# Patient Record
Sex: Female | Born: 1935 | Race: White | Hispanic: No | Smoking: Unknown if ever smoked
Health system: Southern US, Community
[De-identification: ages and names within clinical notes are randomized; demographics above are authoritative.]

## PROBLEM LIST (undated history)

## (undated) DIAGNOSIS — E048 Other specified nontoxic goiter: Secondary | ICD-10-CM

## (undated) DIAGNOSIS — N183 Chronic kidney disease, stage 3 unspecified: Secondary | ICD-10-CM

## (undated) DIAGNOSIS — I440 Atrioventricular block, first degree: Secondary | ICD-10-CM

## (undated) DIAGNOSIS — J849 Interstitial pulmonary disease, unspecified: Secondary | ICD-10-CM

## (undated) DIAGNOSIS — I2510 Atherosclerotic heart disease of native coronary artery without angina pectoris: Secondary | ICD-10-CM

## (undated) DIAGNOSIS — J189 Pneumonia, unspecified organism: Secondary | ICD-10-CM

## (undated) DIAGNOSIS — H9190 Unspecified hearing loss, unspecified ear: Secondary | ICD-10-CM

## (undated) DIAGNOSIS — Z952 Presence of prosthetic heart valve: Secondary | ICD-10-CM

## (undated) DIAGNOSIS — I447 Left bundle-branch block, unspecified: Secondary | ICD-10-CM

## (undated) DIAGNOSIS — K921 Melena: Secondary | ICD-10-CM

## (undated) DIAGNOSIS — I340 Nonrheumatic mitral (valve) insufficiency: Secondary | ICD-10-CM

## (undated) DIAGNOSIS — I639 Cerebral infarction, unspecified: Secondary | ICD-10-CM

## (undated) DIAGNOSIS — K219 Gastro-esophageal reflux disease without esophagitis: Secondary | ICD-10-CM

## (undated) DIAGNOSIS — R001 Bradycardia, unspecified: Secondary | ICD-10-CM

## (undated) DIAGNOSIS — M8580 Other specified disorders of bone density and structure, unspecified site: Secondary | ICD-10-CM

## (undated) DIAGNOSIS — I5032 Chronic diastolic (congestive) heart failure: Secondary | ICD-10-CM

## (undated) DIAGNOSIS — Z7901 Long term (current) use of anticoagulants: Secondary | ICD-10-CM

## (undated) DIAGNOSIS — Z8679 Personal history of other diseases of the circulatory system: Secondary | ICD-10-CM

## (undated) DIAGNOSIS — I350 Nonrheumatic aortic (valve) stenosis: Secondary | ICD-10-CM

## (undated) DIAGNOSIS — I495 Sick sinus syndrome: Secondary | ICD-10-CM

## (undated) DIAGNOSIS — I480 Paroxysmal atrial fibrillation: Secondary | ICD-10-CM

## (undated) DIAGNOSIS — K5730 Diverticulosis of large intestine without perforation or abscess without bleeding: Secondary | ICD-10-CM

## (undated) DIAGNOSIS — I10 Essential (primary) hypertension: Secondary | ICD-10-CM

## (undated) HISTORY — DX: Personal history of other diseases of the circulatory system: Z86.79

## (undated) HISTORY — DX: Other specified disorders of bone density and structure, unspecified site: M85.80

## (undated) HISTORY — DX: Atherosclerotic heart disease of native coronary artery without angina pectoris: I25.10

## (undated) HISTORY — DX: Essential (primary) hypertension: I10

## (undated) HISTORY — DX: Bradycardia, unspecified: R00.1

## (undated) HISTORY — DX: Left bundle-branch block, unspecified: I44.7

## (undated) HISTORY — PX: OTHER SURGICAL HISTORY: SHX169

## (undated) HISTORY — PX: CARPAL TUNNEL RELEASE: SHX101

## (undated) HISTORY — DX: Melena: K92.1

## (undated) HISTORY — DX: Cerebral infarction, unspecified: I63.9

## (undated) HISTORY — DX: Pneumonia, unspecified organism: J18.9

## (undated) HISTORY — DX: Sick sinus syndrome: I49.5

## (undated) HISTORY — DX: Long term (current) use of anticoagulants: Z79.01

## (undated) HISTORY — DX: Gastro-esophageal reflux disease without esophagitis: K21.9

## (undated) HISTORY — DX: Chronic diastolic (congestive) heart failure: I50.32

## (undated) HISTORY — DX: Atrioventricular block, first degree: I44.0

## (undated) HISTORY — DX: Nonrheumatic mitral (valve) insufficiency: I34.0

## (undated) HISTORY — DX: Chronic kidney disease, stage 3 (moderate): N18.3

## (undated) HISTORY — DX: Other specified nontoxic goiter: E04.8

## (undated) HISTORY — DX: Interstitial pulmonary disease, unspecified: J84.9

## (undated) HISTORY — DX: Diverticulosis of large intestine without perforation or abscess without bleeding: K57.30

## (undated) HISTORY — PX: CATARACT EXTRACTION: SUR2

## (undated) HISTORY — DX: Paroxysmal atrial fibrillation: I48.0

## (undated) HISTORY — DX: Chronic kidney disease, stage 3 unspecified: N18.30

## (undated) HISTORY — DX: Nonrheumatic aortic (valve) stenosis: I35.0

---

## 1997-12-27 ENCOUNTER — Ambulatory Visit (HOSPITAL_COMMUNITY): Admission: RE | Admit: 1997-12-27 | Discharge: 1997-12-27 | Payer: Self-pay | Admitting: Emergency Medicine

## 1998-07-11 ENCOUNTER — Other Ambulatory Visit: Admission: RE | Admit: 1998-07-11 | Discharge: 1998-07-11 | Payer: Self-pay | Admitting: Emergency Medicine

## 1999-08-12 ENCOUNTER — Other Ambulatory Visit: Admission: RE | Admit: 1999-08-12 | Discharge: 1999-08-12 | Payer: Self-pay | Admitting: Emergency Medicine

## 2000-02-04 ENCOUNTER — Ambulatory Visit (HOSPITAL_COMMUNITY): Admission: RE | Admit: 2000-02-04 | Discharge: 2000-02-04 | Payer: Self-pay | Admitting: Emergency Medicine

## 2000-02-04 ENCOUNTER — Encounter: Payer: Self-pay | Admitting: Emergency Medicine

## 2000-09-23 ENCOUNTER — Ambulatory Visit (HOSPITAL_COMMUNITY): Admission: RE | Admit: 2000-09-23 | Discharge: 2000-09-23 | Payer: Self-pay | Admitting: Specialist

## 2000-11-04 ENCOUNTER — Ambulatory Visit (HOSPITAL_COMMUNITY): Admission: RE | Admit: 2000-11-04 | Discharge: 2000-11-04 | Payer: Self-pay | Admitting: Specialist

## 2001-02-03 ENCOUNTER — Other Ambulatory Visit: Admission: RE | Admit: 2001-02-03 | Discharge: 2001-02-03 | Payer: Self-pay | Admitting: Emergency Medicine

## 2001-02-11 ENCOUNTER — Encounter: Payer: Self-pay | Admitting: Emergency Medicine

## 2001-02-11 ENCOUNTER — Ambulatory Visit (HOSPITAL_COMMUNITY): Admission: RE | Admit: 2001-02-11 | Discharge: 2001-02-11 | Payer: Self-pay | Admitting: Emergency Medicine

## 2001-02-15 ENCOUNTER — Encounter: Admission: RE | Admit: 2001-02-15 | Discharge: 2001-02-15 | Payer: Self-pay | Admitting: Emergency Medicine

## 2001-02-15 ENCOUNTER — Encounter: Payer: Self-pay | Admitting: Emergency Medicine

## 2002-02-18 ENCOUNTER — Ambulatory Visit (HOSPITAL_COMMUNITY): Admission: RE | Admit: 2002-02-18 | Discharge: 2002-02-18 | Payer: Self-pay | Admitting: Emergency Medicine

## 2002-02-18 ENCOUNTER — Encounter: Payer: Self-pay | Admitting: Emergency Medicine

## 2003-02-28 ENCOUNTER — Ambulatory Visit (HOSPITAL_COMMUNITY): Admission: RE | Admit: 2003-02-28 | Discharge: 2003-02-28 | Payer: Self-pay | Admitting: Emergency Medicine

## 2003-02-28 ENCOUNTER — Encounter: Payer: Self-pay | Admitting: Emergency Medicine

## 2004-02-29 ENCOUNTER — Ambulatory Visit (HOSPITAL_COMMUNITY): Admission: RE | Admit: 2004-02-29 | Discharge: 2004-02-29 | Payer: Self-pay | Admitting: Emergency Medicine

## 2005-03-04 ENCOUNTER — Ambulatory Visit (HOSPITAL_COMMUNITY): Admission: RE | Admit: 2005-03-04 | Discharge: 2005-03-04 | Payer: Self-pay | Admitting: Emergency Medicine

## 2005-06-16 ENCOUNTER — Encounter: Admission: RE | Admit: 2005-06-16 | Discharge: 2005-06-16 | Payer: Self-pay | Admitting: Family Medicine

## 2005-06-16 IMAGING — CT CT PELVIS W/O CM
2 of 4 series · 13 of 32 positions shown, 18 images · IV contrast (agent unspecified)
Comparison: None available.

CLINICAL DATA: Abdominal and pelvic pain, right flank pain.
 ABDOMEN CT WITHOUT CONTRAST:
TECHNIQUE: Multidetector CT imaging of the abdomen was performed following the standard protocol without IV contrast.
TECHNIQUE: Multidetector CT imaging of the pelvis was performed following the standard protocol without IV contrast.
 Descending and sigmoid colonic diverticulosis identified without evidence of diverticulitis.  No free fluid or enlarged lymph nodes are present.  The visualized bowel and bladder are otherwise unremarkable.  No free fluid or enlarged lymph nodes.  Degenerative changes in the lumbar spine are identified.

[Series 2: renal stone · axial · 0.85mm/px · z∈[-338,-98]mm · 5 of 73 slices shown, 10 images]
[im 13/73  soft-tissue]
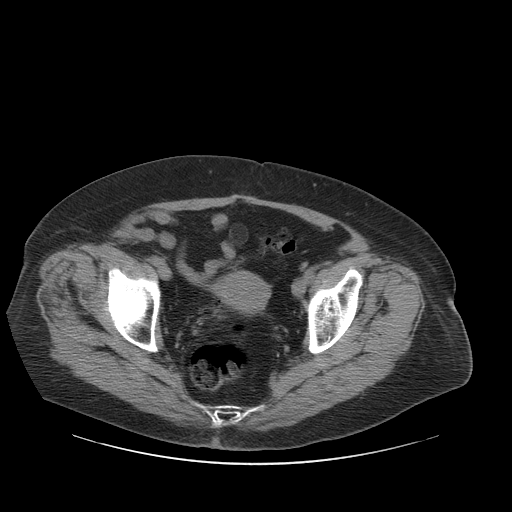
[im 13/73  bone]
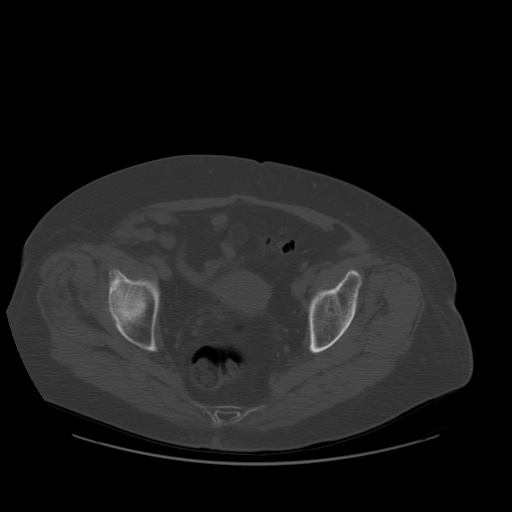
[im 25/73  soft-tissue]
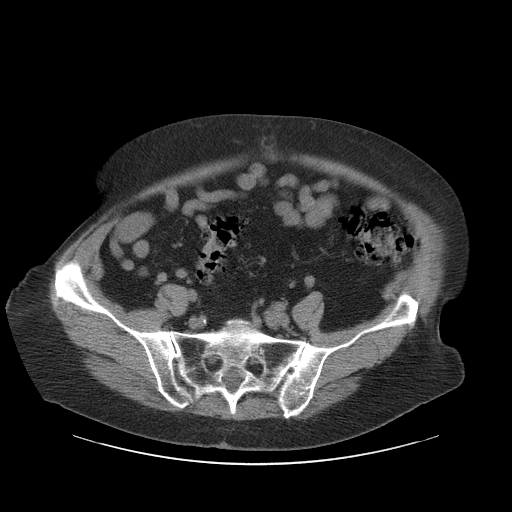
[im 25/73  lung]
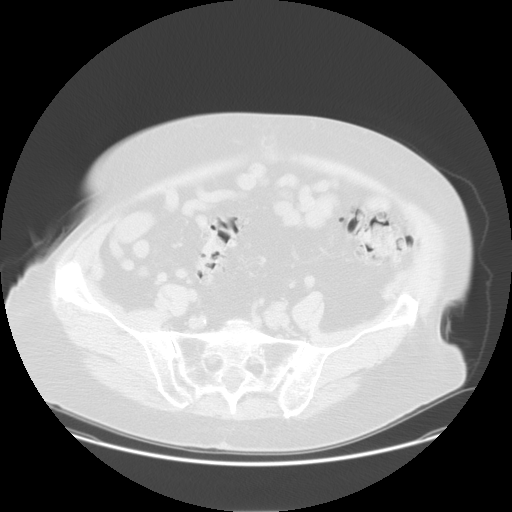
[im 37/73  soft-tissue]
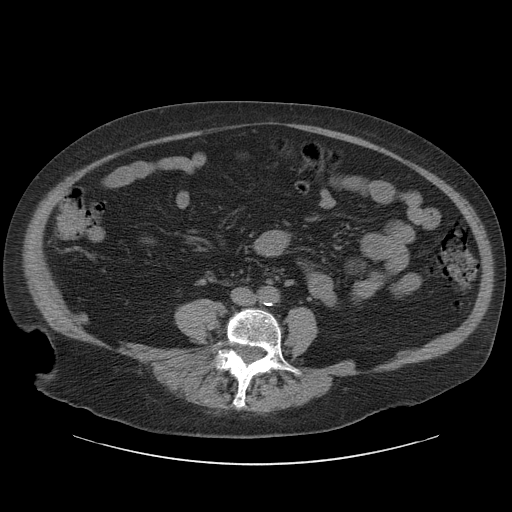
[im 37/73  lung]
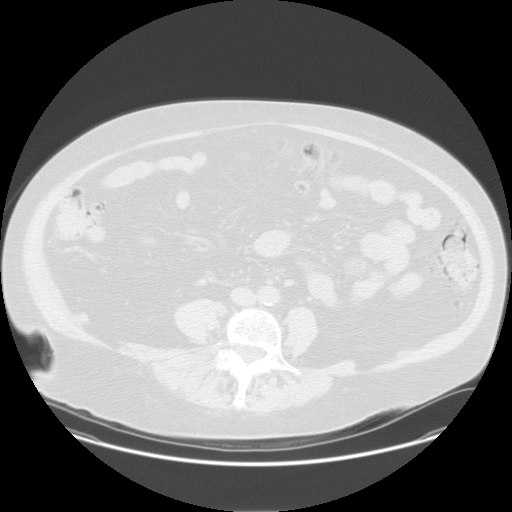
[im 49/73  soft-tissue]
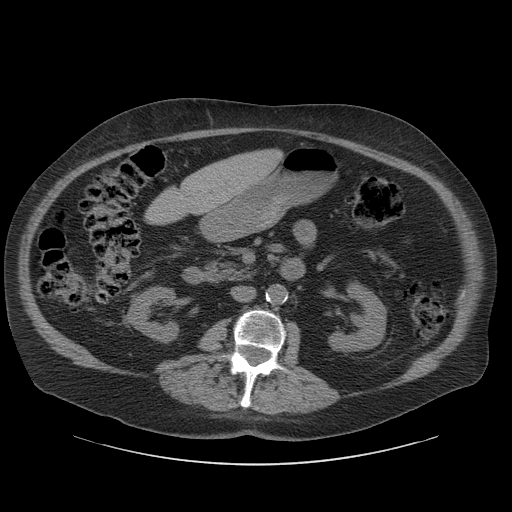
[im 49/73  lung]
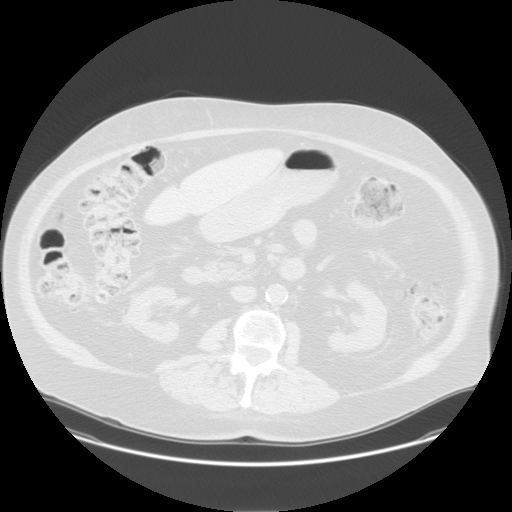
[im 61/73  soft-tissue]
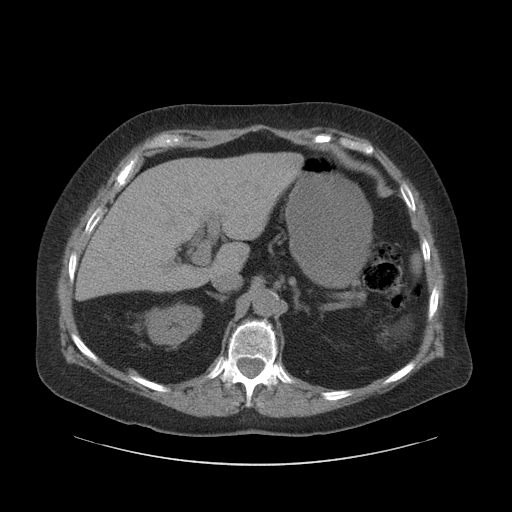
[im 61/73  lung]
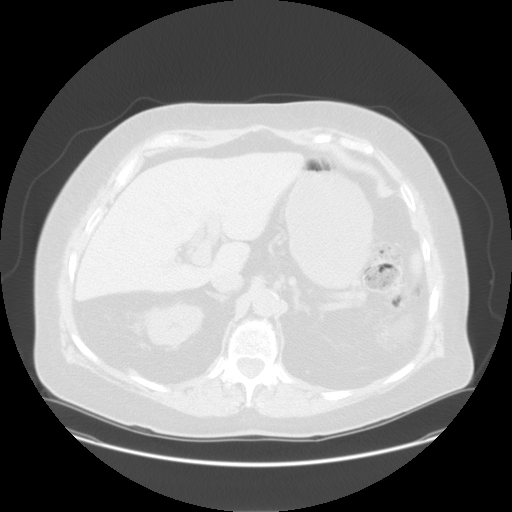

[Series 104: reformatted · sagittal · 0.85mm/px · 8 of 154 slices shown]
[im 12/154  soft-tissue]
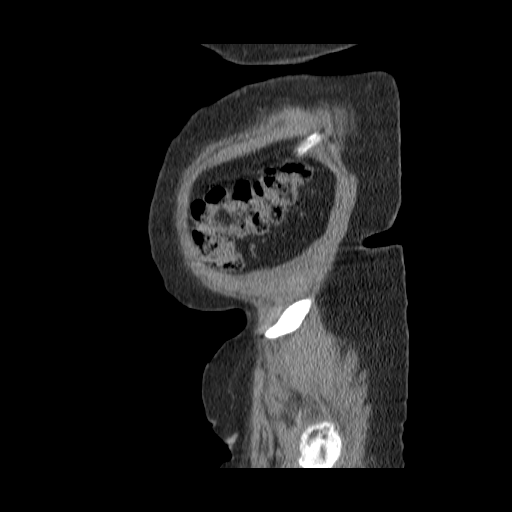
[im 36/154  soft-tissue]
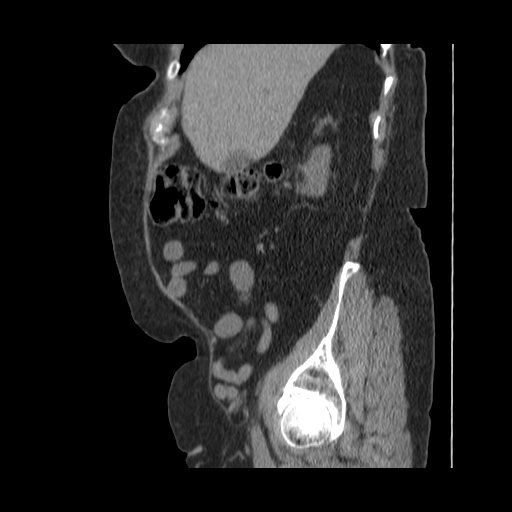
[im 48/154  soft-tissue]
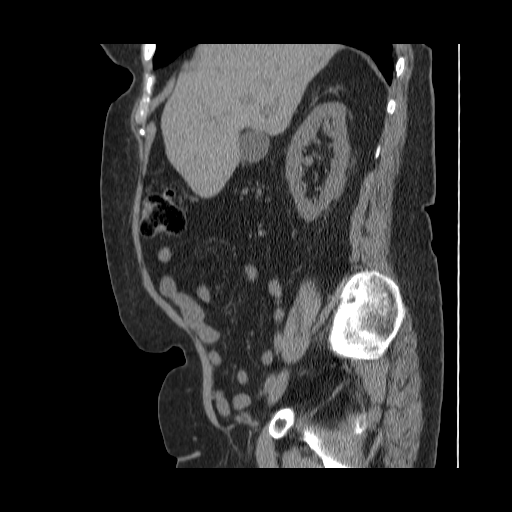
[im 71/154  soft-tissue]
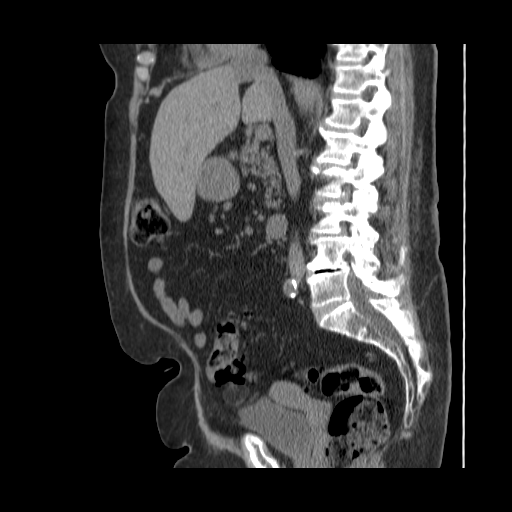
[im 83/154  soft-tissue]
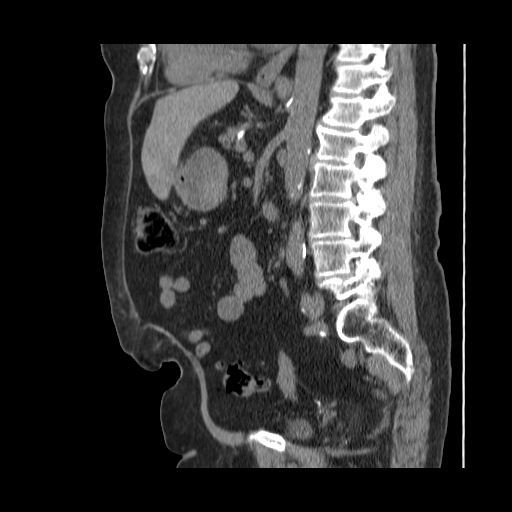
[im 106/154  soft-tissue]
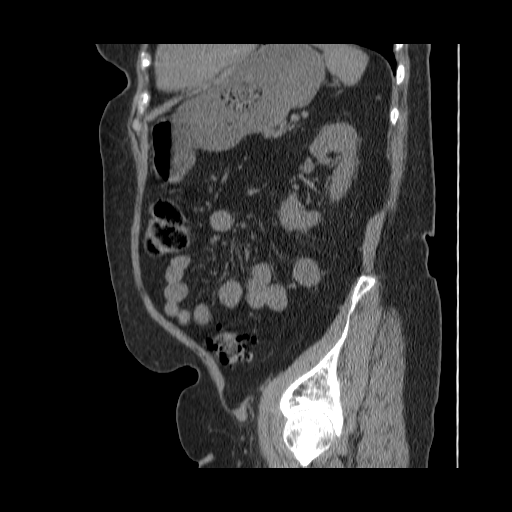
[im 118/154  soft-tissue]
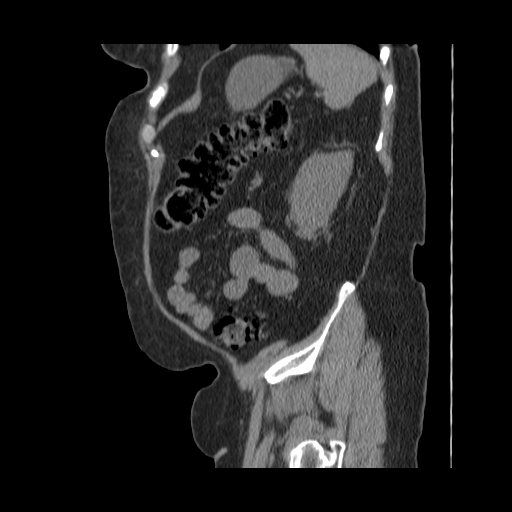
[im 142/154  soft-tissue]
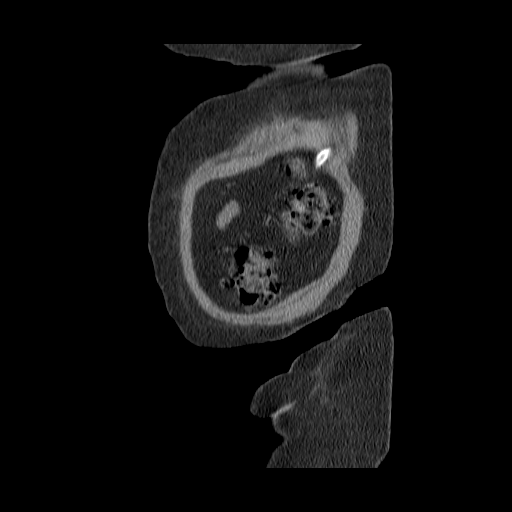

[13 of 32 positions shown; findings below may reference images not displayed]

Visualized liver and spleen are unremarkable.  The adrenal glands, pancreas, gallbladder are within normal limits.  Mild bilateral perinephric stranding may be compatible with inflammation of uncertain chronicity.  There is no evidence of hydronephrosis or perinephric calculi.  Left sided colonic diverticulosis is present without evidence of diverticulitis.  No evidence of free fluid, enlarged lymph nodes, abdominal aortic aneurysm, or biliary dilatation.  Please note that parenchymal abnormalities may be missed as intravenous contrast was not administered.
IMPRESSION: 1.  Mild perinephric inflammation of uncertain chronicity but may be remote.  No evidence of hydronephrosis or urinary calculi. 
 2.  Colonic diverticulosis without evidence of diverticulitis. 
 PELVIS CT WITHOUT CONTRAST:
IMPRESSION: 1.  No acute abnormality.  
 2.  Colonic diverticulosis without evidence of diverticulitis.

## 2006-03-06 ENCOUNTER — Ambulatory Visit (HOSPITAL_COMMUNITY): Admission: RE | Admit: 2006-03-06 | Discharge: 2006-03-06 | Payer: Self-pay | Admitting: Emergency Medicine

## 2007-03-09 ENCOUNTER — Ambulatory Visit (HOSPITAL_COMMUNITY): Admission: RE | Admit: 2007-03-09 | Discharge: 2007-03-09 | Payer: Self-pay | Admitting: Emergency Medicine

## 2007-08-18 HISTORY — PX: MRI: SHX5353

## 2007-10-19 ENCOUNTER — Encounter: Payer: Self-pay | Admitting: Emergency Medicine

## 2007-10-19 ENCOUNTER — Ambulatory Visit (HOSPITAL_COMMUNITY): Admission: RE | Admit: 2007-10-19 | Discharge: 2007-10-19 | Payer: Self-pay | Admitting: Emergency Medicine

## 2007-10-19 ENCOUNTER — Ambulatory Visit: Payer: Self-pay | Admitting: Surgery

## 2008-03-14 ENCOUNTER — Ambulatory Visit (HOSPITAL_COMMUNITY): Admission: RE | Admit: 2008-03-14 | Discharge: 2008-03-14 | Payer: Self-pay | Admitting: Emergency Medicine

## 2008-11-30 ENCOUNTER — Encounter: Admission: RE | Admit: 2008-11-30 | Discharge: 2008-11-30 | Payer: Self-pay | Admitting: Emergency Medicine

## 2008-12-12 ENCOUNTER — Inpatient Hospital Stay (HOSPITAL_COMMUNITY): Admission: AD | Admit: 2008-12-12 | Discharge: 2008-12-15 | Payer: Self-pay | Admitting: Cardiology

## 2008-12-12 IMAGING — CR DG CHEST 1V PORT
1 series · 1 of 1 positions shown · non-contrast
Comparison: None

CLINICAL DATA: Atrial fibrillation

PORTABLE CHEST - 1 VIEW

[AP]
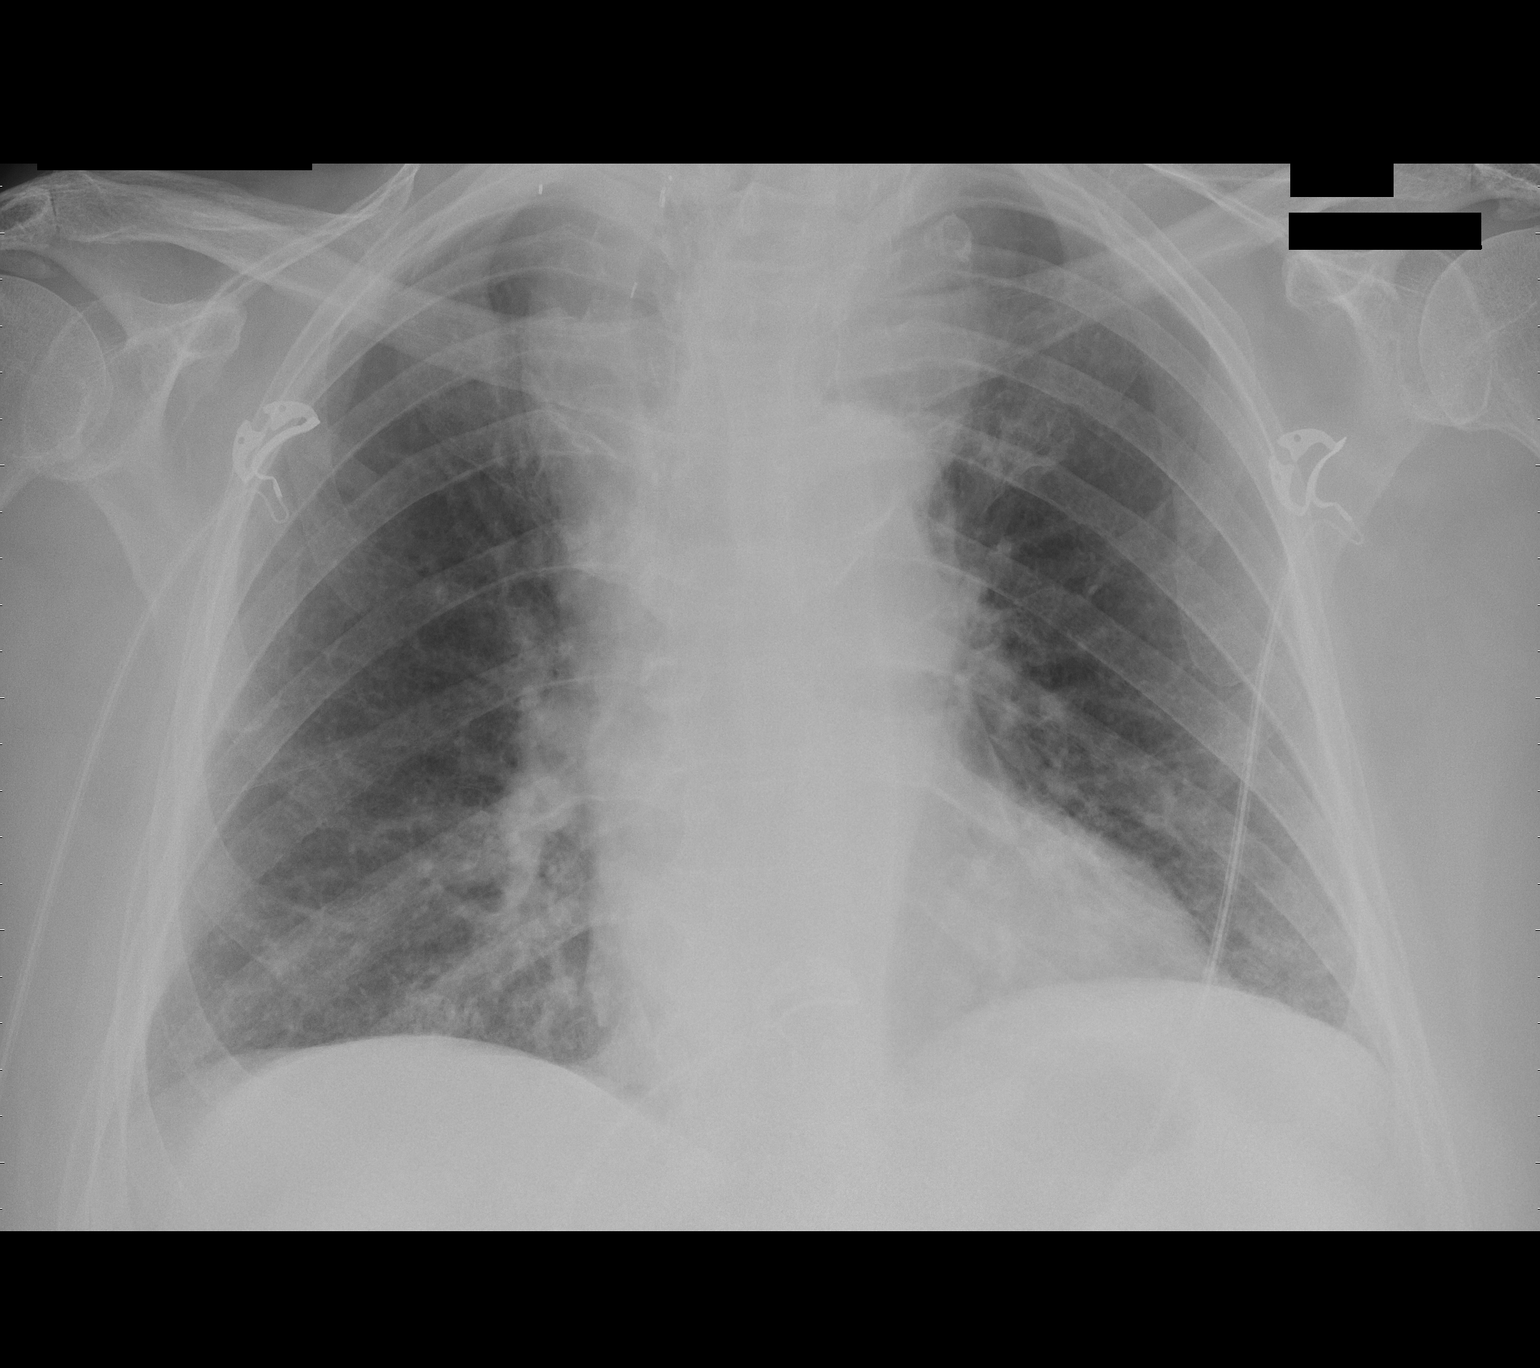

[1 of 1 positions shown; findings below may reference images not displayed]

FINDINGS: Heart size is normal.

There is no pleural effusion or pulmonary edema.

The lung volumes are low.

There is chronic interstitial changes.

No airspace consolidation identified.
IMPRESSION: 1.  No acute findings.
2.  Low lung volumes and chronic lung disease.

## 2008-12-22 ENCOUNTER — Emergency Department (HOSPITAL_COMMUNITY): Admission: EM | Admit: 2008-12-22 | Discharge: 2008-12-22 | Payer: Self-pay | Admitting: Emergency Medicine

## 2009-03-21 ENCOUNTER — Ambulatory Visit (HOSPITAL_COMMUNITY): Admission: RE | Admit: 2009-03-21 | Discharge: 2009-03-21 | Payer: Self-pay | Admitting: Emergency Medicine

## 2009-04-25 ENCOUNTER — Ambulatory Visit (HOSPITAL_COMMUNITY): Admission: RE | Admit: 2009-04-25 | Discharge: 2009-04-25 | Payer: Self-pay | Admitting: Cardiology

## 2009-04-25 IMAGING — CT CT NECK W/ CM
4 of 7 series · 13 of 33 positions shown, 14 images · IV contrast (APPLIED)
Comparison: Chest radiograph dated [DATE]

CT NECK

CLINICAL DATA: Evaluate for mass.

CT NECK AND CHEST WITH CONTRAST
TECHNIQUE: Multidetector CT imaging of the neck and chest was
performed using the standard protocol after bolus administration of
intravenous contrast.
Contrast: 100 ml of omni 300

[Series 3: chest 5.0 st · axial · 0.66mm/px · z∈[-388,-288]mm · 2 of 60 slices shown, 3 images]
[im 20/60  soft-tissue]
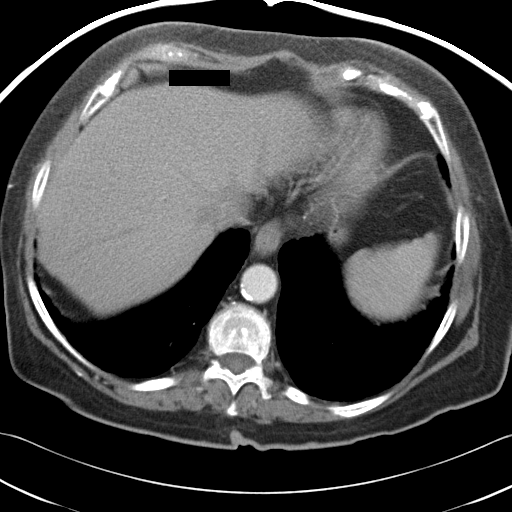
[im 20/60  bone]
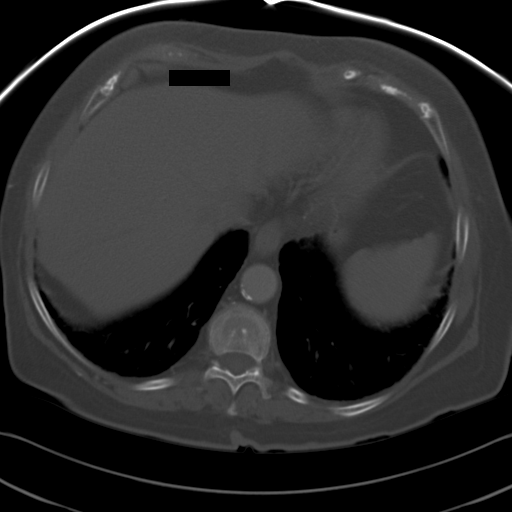
[im 40/60  bone]
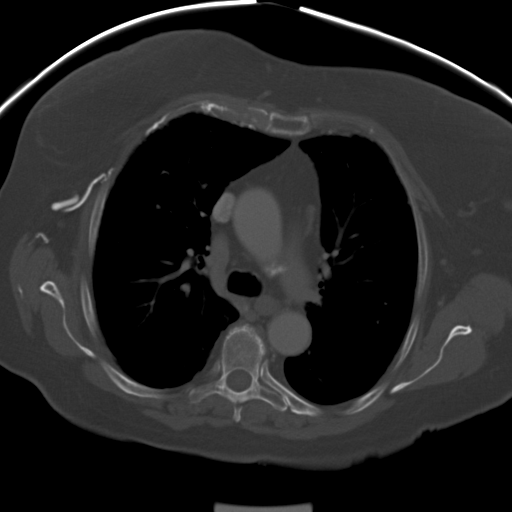

[Series 6: st neck 2.0 b31s · axial · 0.32mm/px · z∈[-238,-96]mm · 5 of 107 slices shown]
[im 18/107  bone]
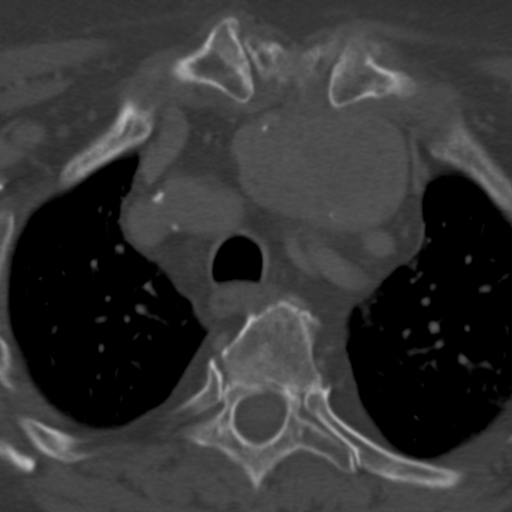
[im 36/107  bone]
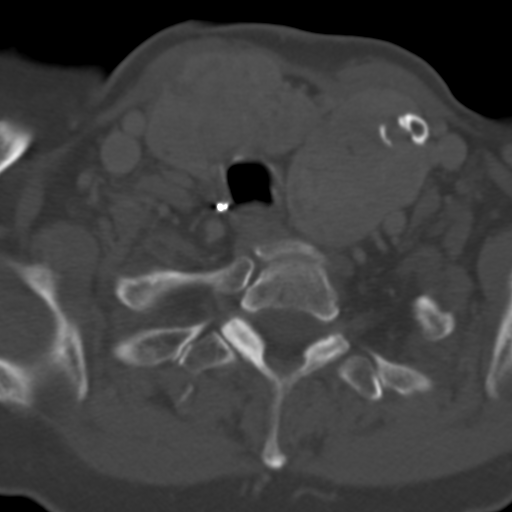
[im 54/107  bone]
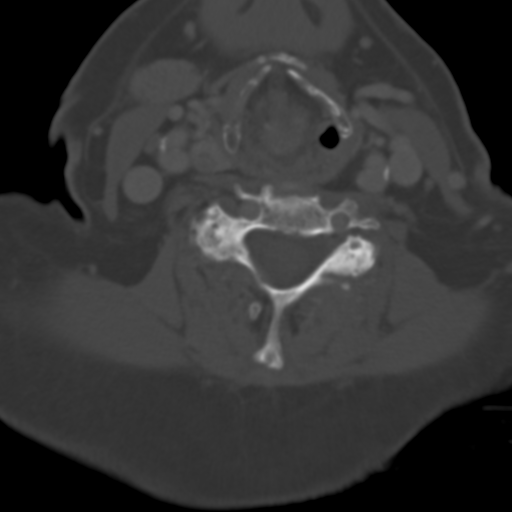
[im 71/107  bone]
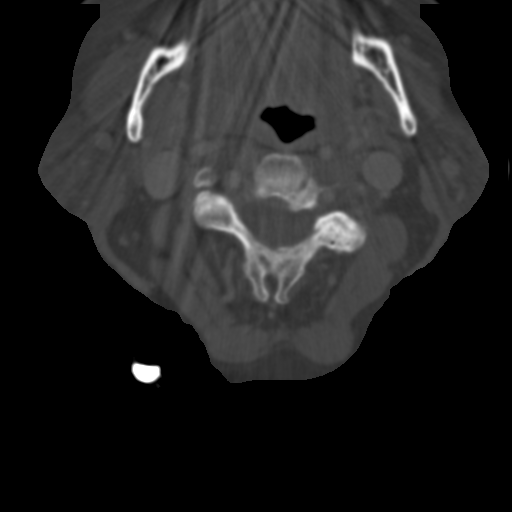
[im 89/107  bone]
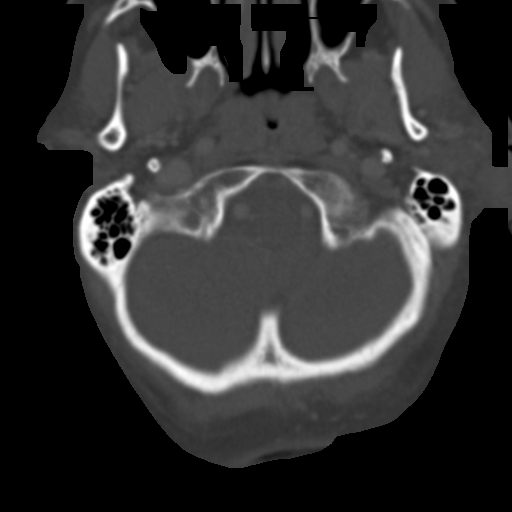

[Series 602: <mpr thick cor · coronal · 0.42mm/px · 1 of 67 slices shown]
[im 34/67  bone]
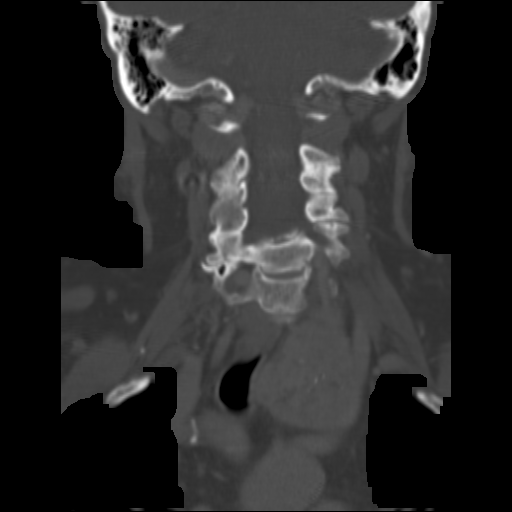

[Series 603: <mpr thick sag · sagittal · 0.42mm/px · 5 of 77 slices shown]
[im 13/77  bone]
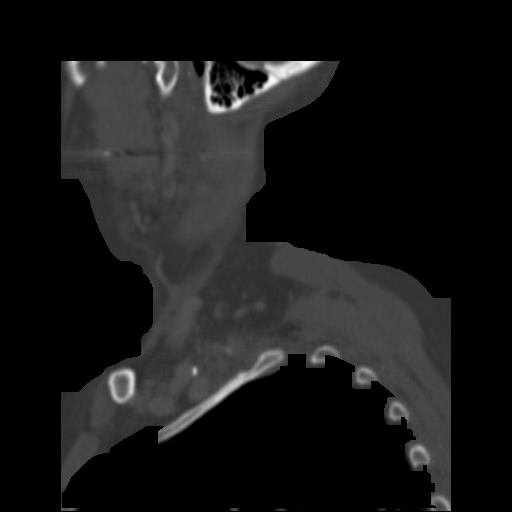
[im 26/77  bone]
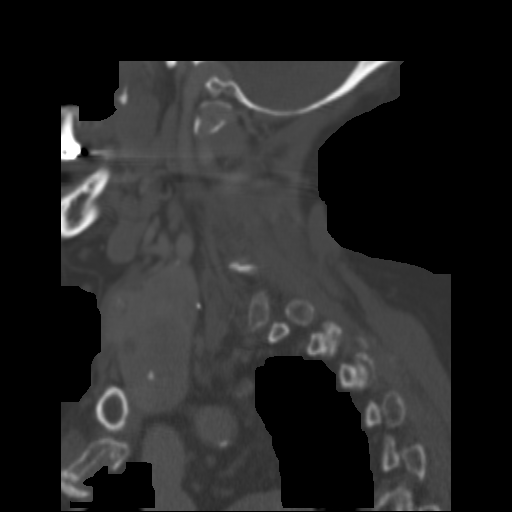
[im 39/77  bone]
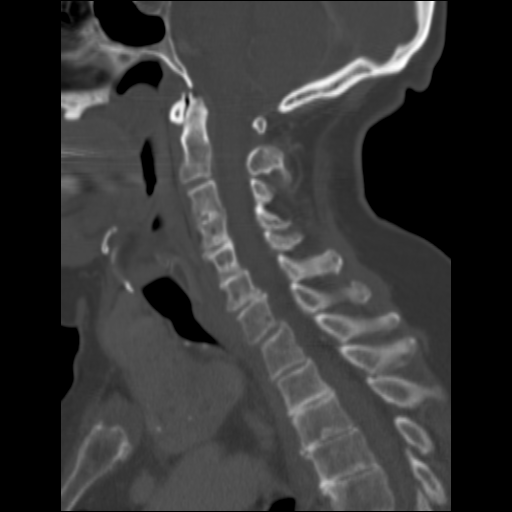
[im 51/77  bone]
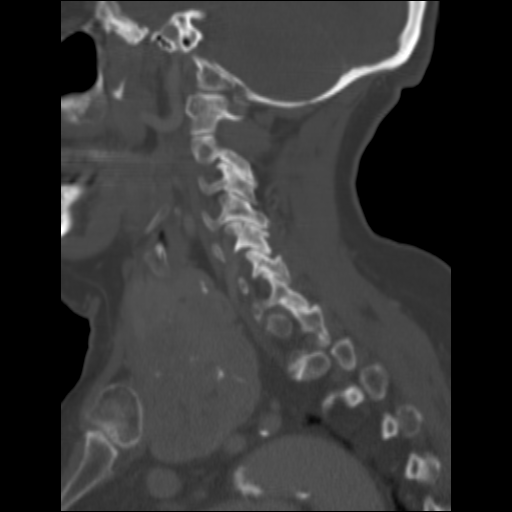
[im 64/77  bone]
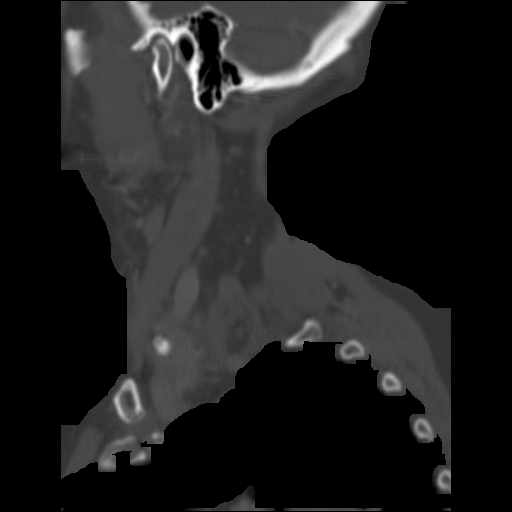

[13 of 33 positions shown; findings below may reference images not displayed]

FINDINGS: The visualized intracranial contents appear normal.

The parotid glands and submandibular glands appear normal.

The mastoid air cells and the visualized portions of the paranasal
sinuses are unremarkable.

No enlarged lymph nodes identified.

The vascular structures of the neck appear normal.

The patient has a very large multinodular goiter accounting for the
mass seen on chest radiograph.  This goiter contains multiple areas
of calcification and low density nodules.  There is mass effect
upon the trachea.
IMPRESSION: 1.  Very large multinodular goiter.

CT CHEST
FINDINGS: There is significant retrosternal extension of the
goiter.

There are no enlarged supraclavicular or axillary lymph nodes.

No enlarged mediastinal or hilar lymph nodes.

The thoracic aorta and pulmonary artery are both normal in caliber
without aneurysm.

There is no pericardial or pleural effusion.

There are basilar predominant reticular interstitial markings
identified.

No suspicious pulmonary nodules or masses noted.

The bones appear osteopenic and there is spondylosis within the
thoracic spine.
IMPRESSION: 1.  Negative for mass or aneurysm.
2.  Large multinodular goiter exhibits retrosternal extension into
the superior mediastinum.
3.  Reticular interstitial markings suggest interstitial lung
disease such as pulmonary fibrosis.

## 2009-05-07 ENCOUNTER — Encounter: Admission: RE | Admit: 2009-05-07 | Discharge: 2009-05-07 | Payer: Self-pay | Admitting: Internal Medicine

## 2009-05-07 IMAGING — US US SOFT TISSUE HEAD/NECK
1 series · 14 of 25 positions shown · non-contrast
Comparison: CT neck and chest [DATE]

CLINICAL DATA: Multinodular goiter follow-up.

THYROID ULTRASOUND
TECHNIQUE: Ultrasound examination of the thyroid gland and
adjacent soft tissues was performed.

[Series 1: us soft tissue head/neck · 0.16mm/px · 14 of 49 slices shown]
[im 1/49]
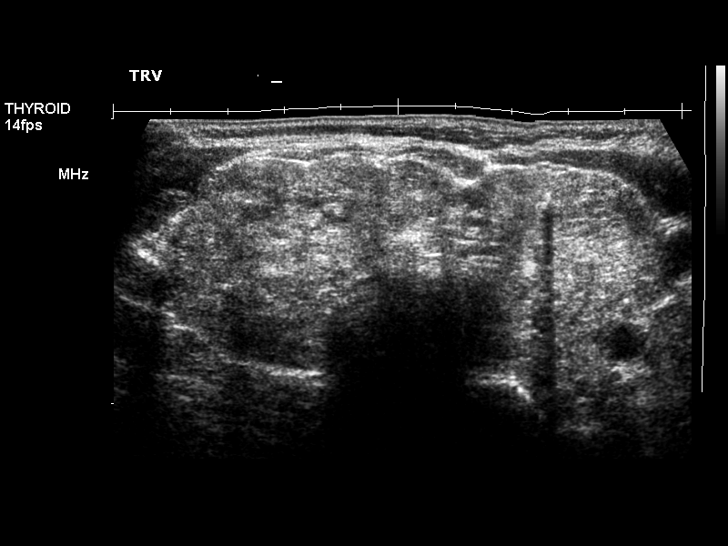
[im 5/49]
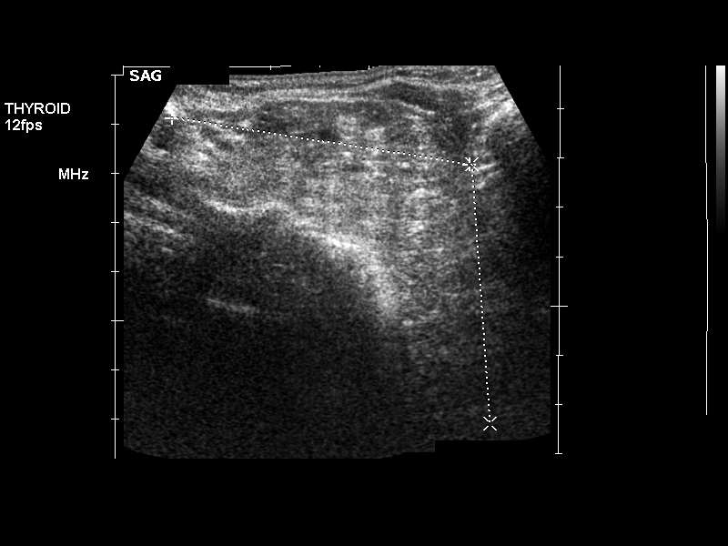
[im 9/49]
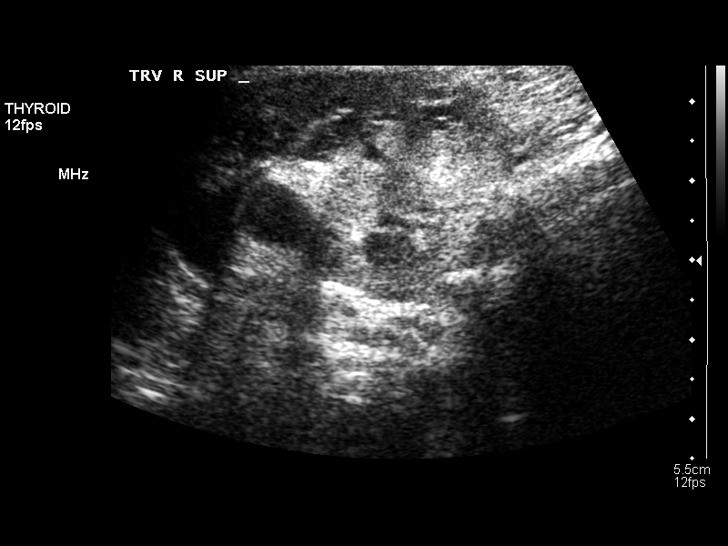
[im 13/49]
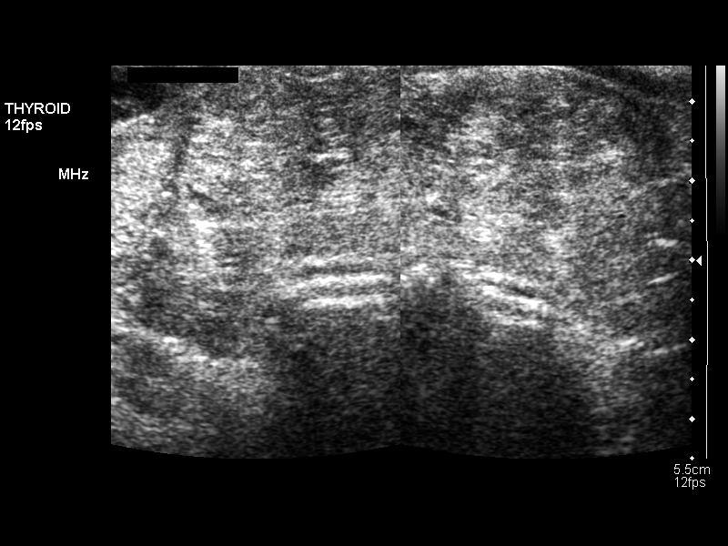
[im 17/49]
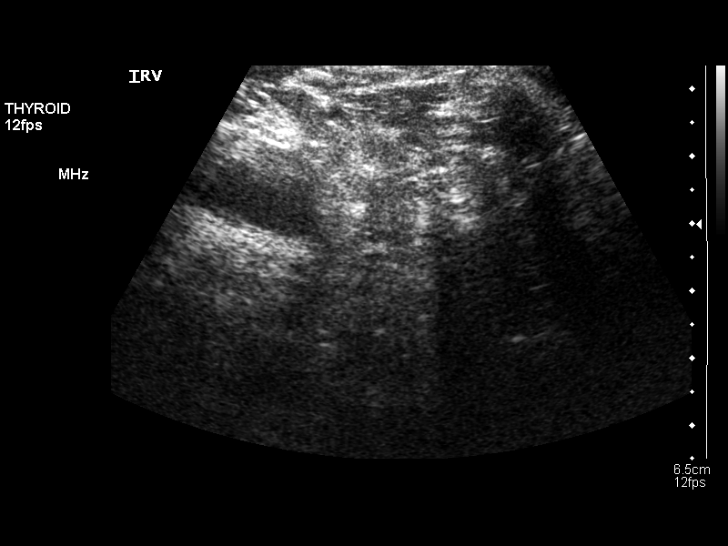
[im 19/49]
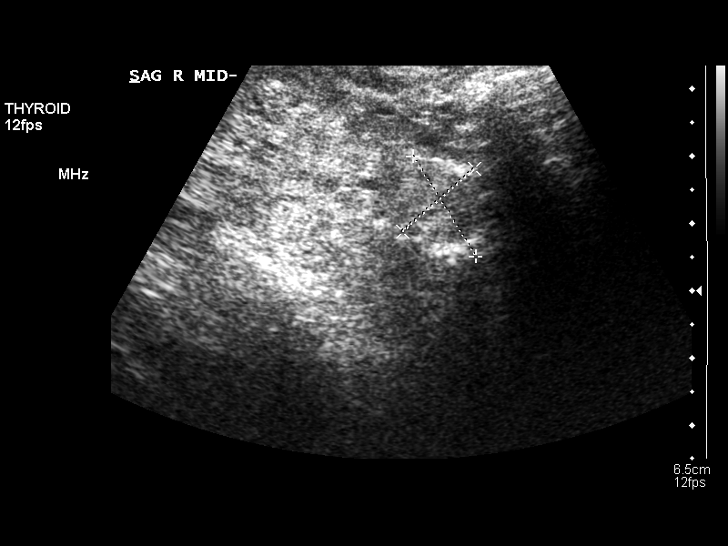
[im 23/49]
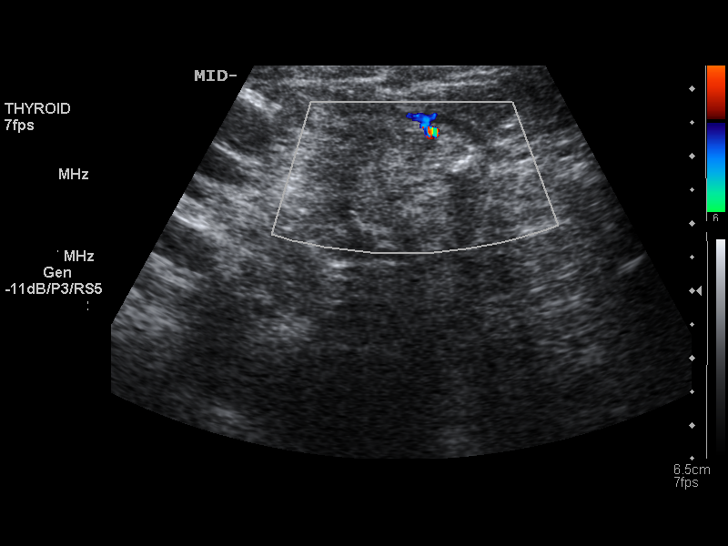
[im 27/49]
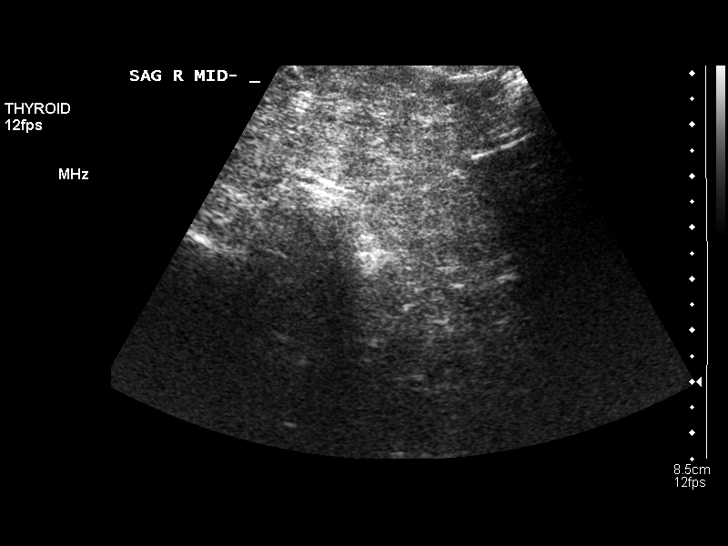
[im 31/49]
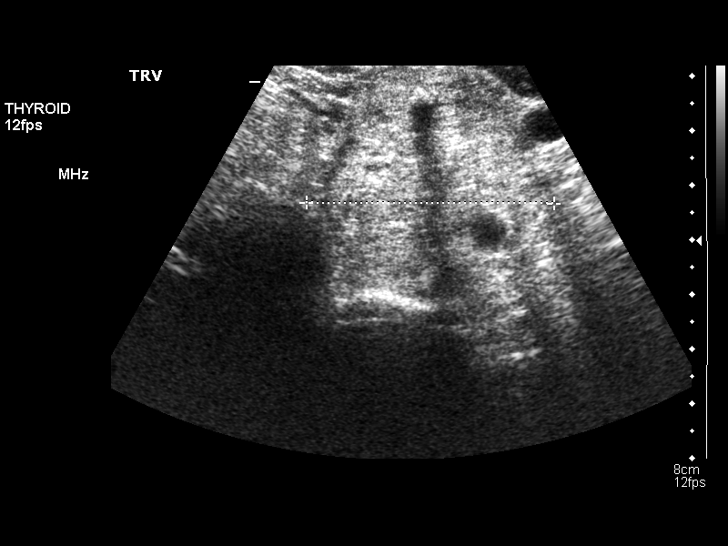
[im 33/49]
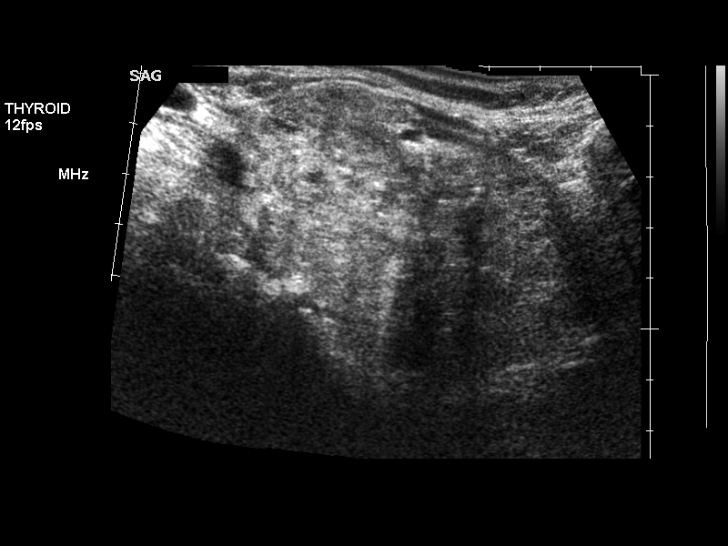
[im 37/49]
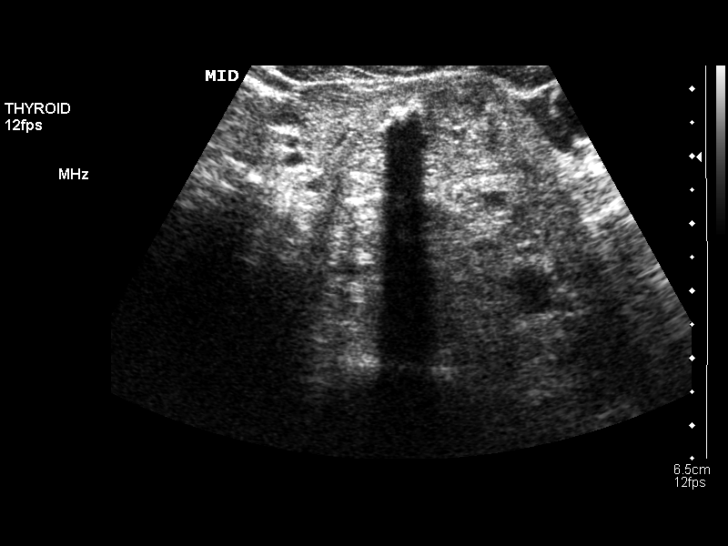
[im 41/49]
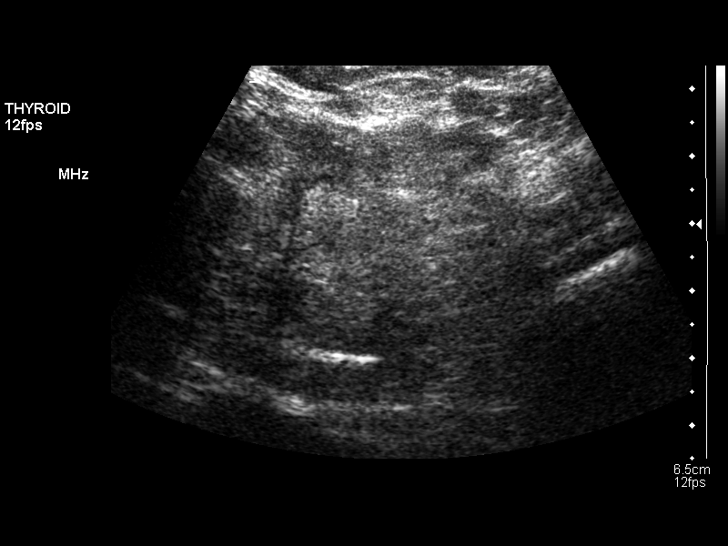
[im 45/49]
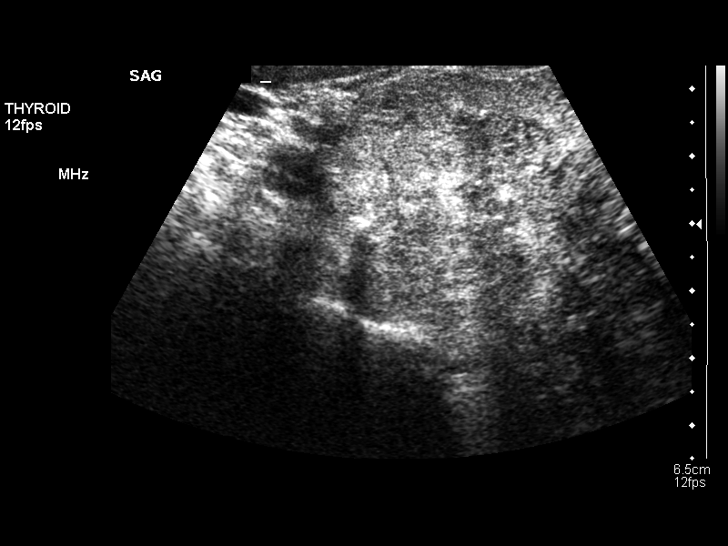
[im 49/49]
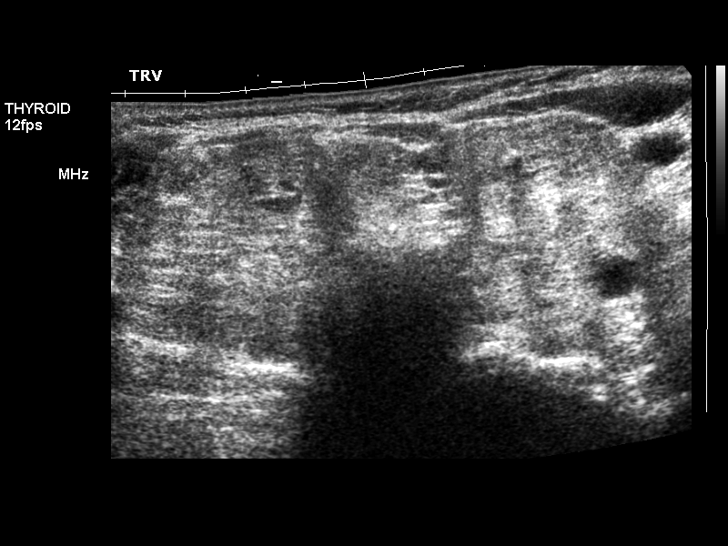

[14 of 25 positions shown; findings below may reference images not displayed]

FINDINGS: The thyroid is markedly enlarged and heterogeneous, with
areas of calcification.  Right lobe of the thyroid measures
approximately 11.4 x 3.3 x 3.7 cm and left lobe, 9.0 x 5.1 x
cm.  Isthmus measures 2.5 cm.

There may be two solid nodules in the medial aspect of the mid pole
of the right thyroid, measuring 2.5 x 1.9 x 3.4 cm and 1.8 x 1.4 x
1.8 cm, respectively.
IMPRESSION: Markedly enlarged and heterogeneous thyroid, with two possible
solid nodules in the right mid pole.

## 2009-07-03 ENCOUNTER — Encounter: Admission: RE | Admit: 2009-07-03 | Discharge: 2009-07-03 | Payer: Self-pay | Admitting: Internal Medicine

## 2009-07-03 ENCOUNTER — Other Ambulatory Visit: Admission: RE | Admit: 2009-07-03 | Discharge: 2009-07-03 | Payer: Self-pay | Admitting: Diagnostic Radiology

## 2009-07-03 IMAGING — US US BIOPSY
1 series · 6 of 6 positions shown · non-contrast
Comparison: Ultrasound performed [DATE]

CLINICAL DATA: Finding of thyroid abnormality on CT of the neck and
chest.  Ultrasound of performed revealing one to two solid nodules
in the mid pole  of the right thyroid.  Request has been made for
fine needle aspirate of same.

ULTRASOUND-GUIDED NEEDLE ASPIRATE BIOPSY, RIGHT LOBE OF THYROID
The above procedure was discussed with the patient and written
informed consent was obtained.

[Series 1: us biopsy · 6 acquisitions, 6 frames shown]
[im 1/6]
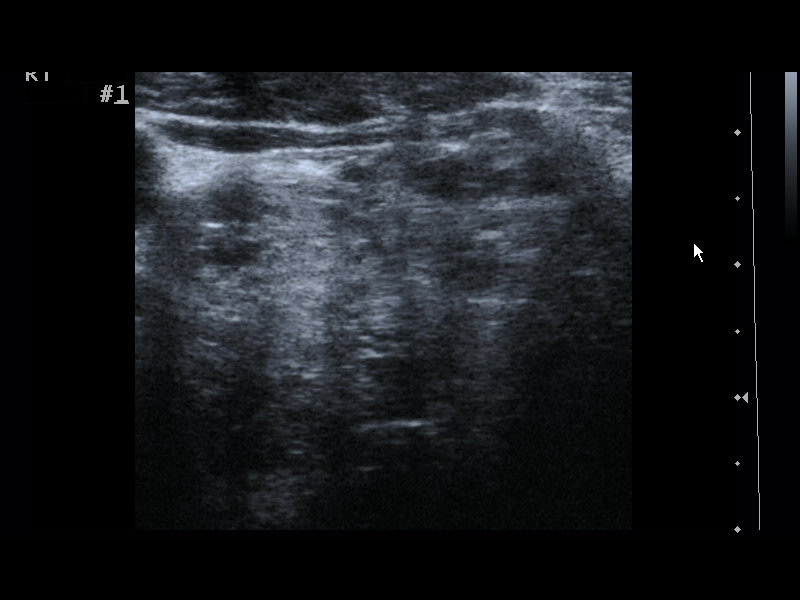
[im 2/6]
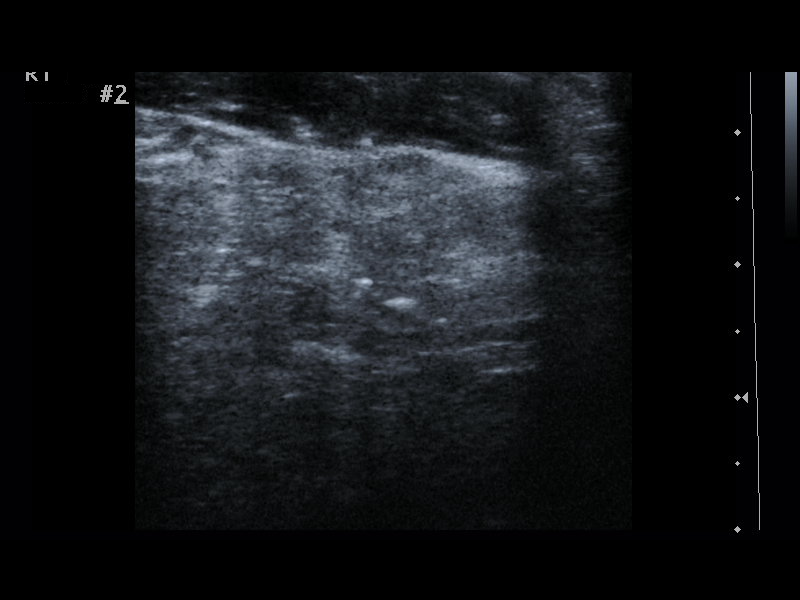
[im 3/6]
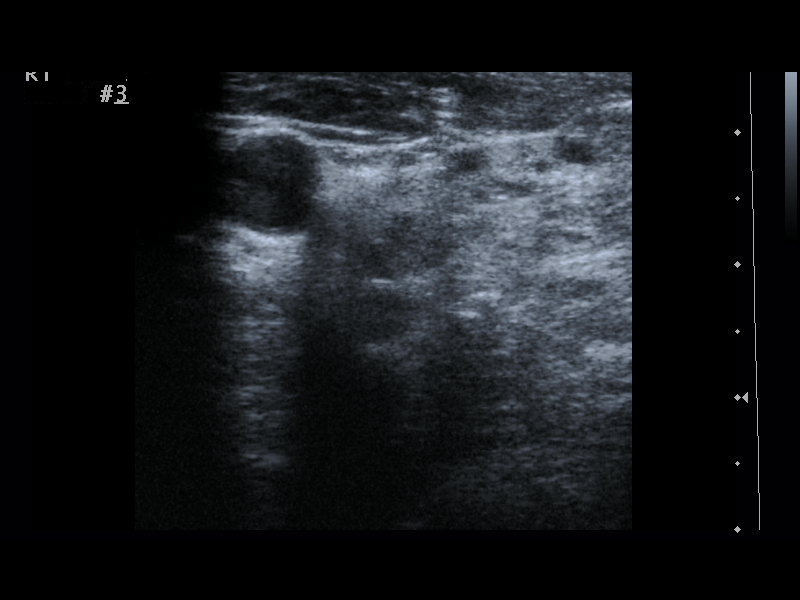
[im 4/6]
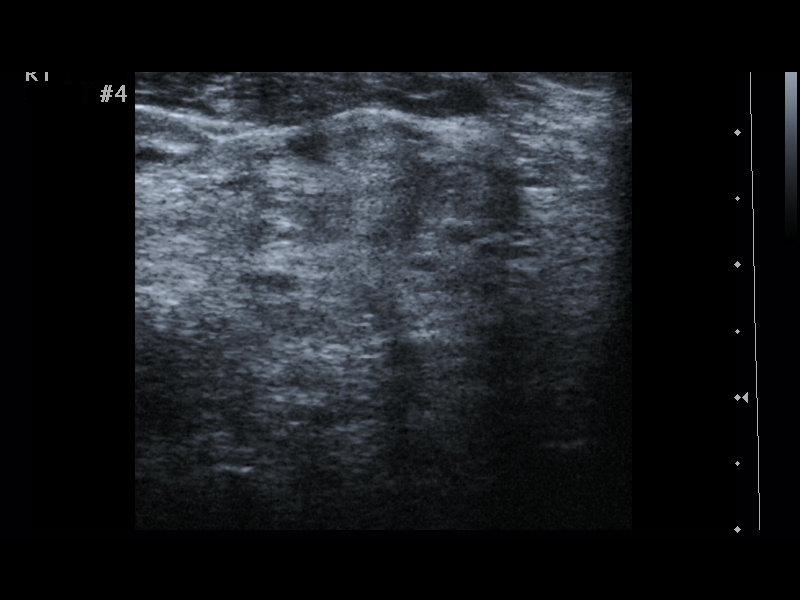
[im 5/6]
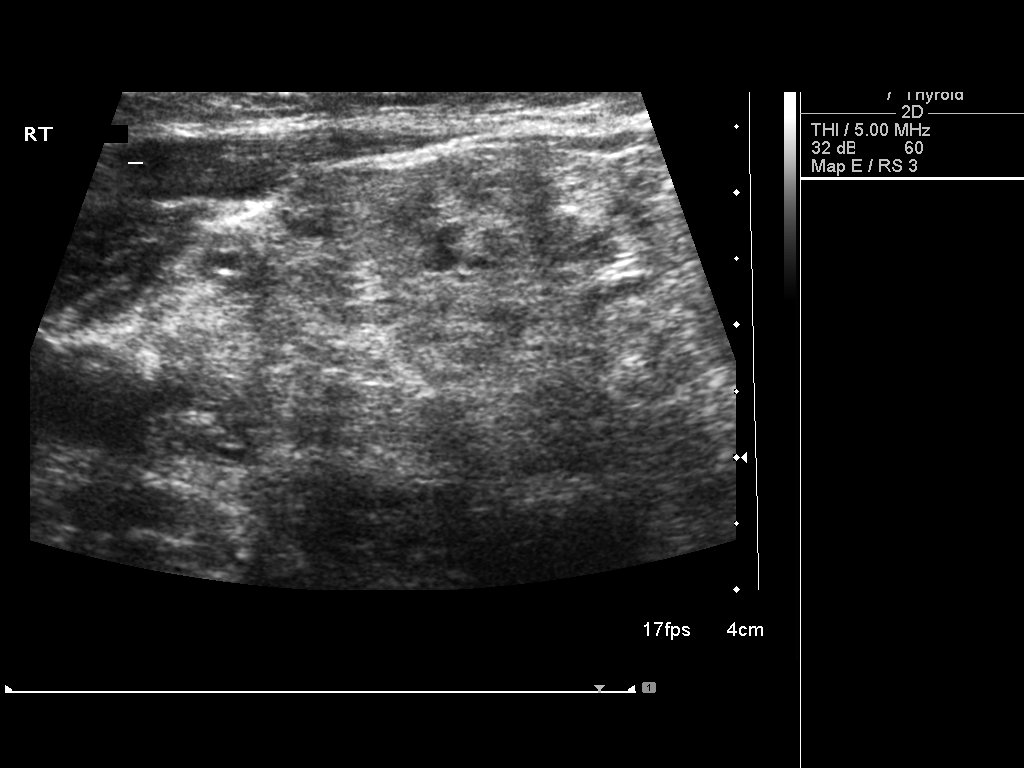
[im 6/6]
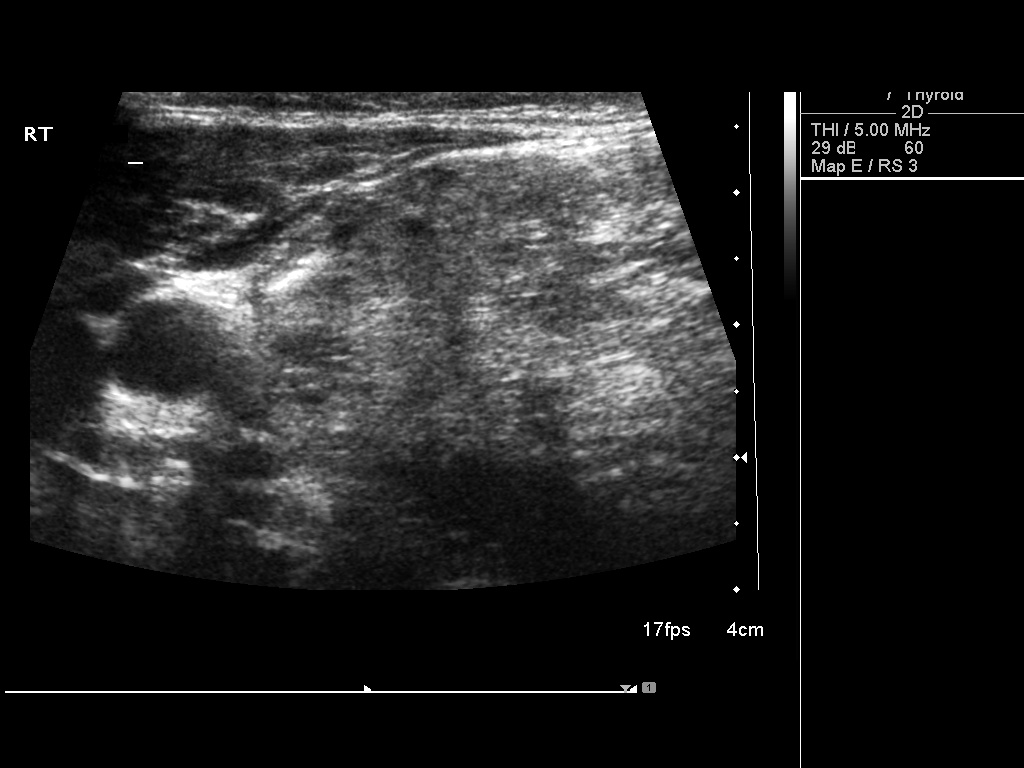

[6 of 6 positions shown; findings below may reference images not displayed]

FINDINGS: Ultrasound was performed to localize and mark an adequate
site for the biopsy.  Two separate discrete nodules could not be
identified on today's examination.  The patient was then prepped
and draped in a normal sterile fashion.  Local anesthesia was
provided with 1% lidocaine.  Using direct ultrasound guidance, 4
passes were made using 25 gauge needles into the large nodule
within the right mid lobe of the thyroid.  Ultrasound was used to
confirm needle placements on all occasions.  Specimens were sent to
Pathology for analysis.  Post procedural imaging demonstrated no
hematoma or immediate complication.  The patient tolerated the
procedure well.
IMPRESSION: Successful fine needle aspirate of right thyroid nodule as
described above.

Read by: RUANDA.-RUANDA

## 2009-12-05 ENCOUNTER — Ambulatory Visit (HOSPITAL_COMMUNITY): Admission: RE | Admit: 2009-12-05 | Discharge: 2009-12-05 | Payer: Self-pay | Admitting: Cardiology

## 2010-03-25 ENCOUNTER — Ambulatory Visit (HOSPITAL_COMMUNITY): Admission: RE | Admit: 2010-03-25 | Discharge: 2010-03-25 | Payer: Self-pay | Admitting: Family Medicine

## 2010-09-25 ENCOUNTER — Other Ambulatory Visit: Payer: Self-pay | Admitting: Dermatology

## 2010-11-12 LAB — GLUCOSE, CAPILLARY
Glucose-Capillary: 122 mg/dL — ABNORMAL HIGH (ref 70–99)
Glucose-Capillary: 122 mg/dL — ABNORMAL HIGH (ref 70–99)
Glucose-Capillary: 134 mg/dL — ABNORMAL HIGH (ref 70–99)
Glucose-Capillary: 151 mg/dL — ABNORMAL HIGH (ref 70–99)
Glucose-Capillary: 153 mg/dL — ABNORMAL HIGH (ref 70–99)
Glucose-Capillary: 160 mg/dL — ABNORMAL HIGH (ref 70–99)
Glucose-Capillary: 162 mg/dL — ABNORMAL HIGH (ref 70–99)
Glucose-Capillary: 163 mg/dL — ABNORMAL HIGH (ref 70–99)
Glucose-Capillary: 194 mg/dL — ABNORMAL HIGH (ref 70–99)
Glucose-Capillary: 206 mg/dL — ABNORMAL HIGH (ref 70–99)
Glucose-Capillary: 211 mg/dL — ABNORMAL HIGH (ref 70–99)
Glucose-Capillary: 99 mg/dL (ref 70–99)

## 2010-11-12 LAB — DIFFERENTIAL
Basophils Absolute: 0 10*3/uL (ref 0.0–0.1)
Basophils Relative: 0 % (ref 0–1)
Eosinophils Absolute: 0.1 10*3/uL (ref 0.0–0.7)
Eosinophils Relative: 2 % (ref 0–5)
Lymphocytes Relative: 15 % (ref 12–46)
Lymphs Abs: 1.3 10*3/uL (ref 0.7–4.0)
Monocytes Absolute: 0.6 10*3/uL (ref 0.1–1.0)
Monocytes Relative: 7 % (ref 3–12)
Neutro Abs: 6.3 10*3/uL (ref 1.7–7.7)
Neutrophils Relative %: 76 % (ref 43–77)

## 2010-11-12 LAB — CBC
HCT: 41 % (ref 36.0–46.0)
HCT: 39.5 % (ref 36.0–46.0)
Hemoglobin: 14.2 g/dL (ref 12.0–15.0)
Hemoglobin: 13.6 g/dL (ref 12.0–15.0)
MCHC: 34.6 g/dL (ref 30.0–36.0)
MCHC: 34.5 g/dL (ref 30.0–36.0)
MCV: 93.4 fL (ref 78.0–100.0)
MCV: 93.9 fL (ref 78.0–100.0)
Platelets: 287 10*3/uL (ref 150–400)
Platelets: 258 10*3/uL (ref 150–400)
RBC: 4.39 MIL/uL (ref 3.87–5.11)
RBC: 4.21 MIL/uL (ref 3.87–5.11)
RDW: 13.1 % (ref 11.5–15.5)
RDW: 12.9 % (ref 11.5–15.5)
WBC: 8.4 10*3/uL (ref 4.0–10.5)
WBC: 7.6 10*3/uL (ref 4.0–10.5)

## 2010-11-12 LAB — COMPREHENSIVE METABOLIC PANEL
ALT: 16 U/L (ref 0–35)
ALT: 11 U/L (ref 0–35)
AST: 23 U/L (ref 0–37)
AST: 21 U/L (ref 0–37)
Albumin: 3.7 g/dL (ref 3.5–5.2)
Albumin: 3.3 g/dL — ABNORMAL LOW (ref 3.5–5.2)
Alkaline Phosphatase: 71 U/L (ref 39–117)
Alkaline Phosphatase: 73 U/L (ref 39–117)
BUN: 18 mg/dL (ref 6–23)
BUN: 14 mg/dL (ref 6–23)
CO2: 25 mEq/L (ref 19–32)
CO2: 25 mEq/L (ref 19–32)
Calcium: 8.9 mg/dL (ref 8.4–10.5)
Calcium: 8.8 mg/dL (ref 8.4–10.5)
Chloride: 104 mEq/L (ref 96–112)
Chloride: 105 mEq/L (ref 96–112)
Creatinine, Ser: 1.12 mg/dL (ref 0.4–1.2)
Creatinine, Ser: 0.9 mg/dL (ref 0.4–1.2)
GFR calc Af Amer: 56 mL/min — ABNORMAL LOW (ref >60)
GFR calc Af Amer: >60 mL/min (ref >60)
GFR calc non Af Amer: 46 mL/min — ABNORMAL LOW (ref >60)
GFR calc non Af Amer: 60 mL/min — ABNORMAL LOW (ref >60)
Glucose, Bld: 133 mg/dL — ABNORMAL HIGH (ref 70–99)
Glucose, Bld: 226 mg/dL — ABNORMAL HIGH (ref 70–99)
Potassium: 4.6 mEq/L (ref 3.5–5.1)
Potassium: 4.1 mEq/L (ref 3.5–5.1)
Sodium: 136 mEq/L (ref 135–145)
Sodium: 137 mEq/L (ref 135–145)
Total Bilirubin: 0.6 mg/dL (ref 0.3–1.2)
Total Bilirubin: 0.4 mg/dL (ref 0.3–1.2)
Total Protein: 6.7 g/dL (ref 6.0–8.3)
Total Protein: 6.6 g/dL (ref 6.0–8.3)

## 2010-11-12 LAB — PROTIME-INR
INR: 2 — ABNORMAL HIGH (ref 0.00–1.49)
INR: 2.3 — ABNORMAL HIGH (ref 0.00–1.49)
INR: 2.3 — ABNORMAL HIGH (ref 0.00–1.49)
INR: 2.5 — ABNORMAL HIGH (ref 0.00–1.49)
Prothrombin Time: 23.7 seconds — ABNORMAL HIGH (ref 11.6–15.2)
Prothrombin Time: 27 seconds — ABNORMAL HIGH (ref 11.6–15.2)
Prothrombin Time: 27.1 seconds — ABNORMAL HIGH (ref 11.6–15.2)
Prothrombin Time: 28.3 seconds — ABNORMAL HIGH (ref 11.6–15.2)

## 2010-11-12 LAB — POCT CARDIAC MARKERS
CKMB, poc: <1 ng/mL — ABNORMAL LOW (ref 1.0–8.0)
Myoglobin, poc: 54 ng/mL (ref 12–200)
Troponin i, poc: <0.05 ng/mL (ref 0.00–0.09)

## 2010-11-12 LAB — TSH: TSH: 1.393 u[IU]/mL (ref 0.350–4.500)

## 2010-11-18 ENCOUNTER — Other Ambulatory Visit: Payer: Self-pay | Admitting: Dermatology

## 2010-12-10 ENCOUNTER — Other Ambulatory Visit: Payer: Self-pay | Admitting: Dermatology

## 2010-12-18 ENCOUNTER — Other Ambulatory Visit: Payer: Self-pay | Admitting: Dermatology

## 2010-12-20 NOTE — Discharge Summary (Signed)
NAMEAVALINE, Alexa Mcdonald              ACCOUNT NO.:  [id number]   MEDICAL RECORD NO.:  [id number]          PATIENT TYPE:  EMS   LOCATION:  [facility]                         FACILITY:  [facility]   PHYSICIAN:  Fransico Him, M.D.     DATE OF BIRTH:  12/06/1920   DATE OF ADMISSION:  12/22/2008  DATE OF DISCHARGE:  12/22/2008                               DISCHARGE SUMMARY   DISCHARGE DIAGNOSES:  1. Paroxysmal atrial fibrillation, amiodarone load.  2. Long-term Coumadin use with therapeutic INR upon discharge.  3. Diabetes mellitus.  4. Hypothyroidism.  5. Osteopenia.  6. Gastroesophageal reflux disease.  7. Left bundle-branch block.  8. Aortic valve sclerosis.   HOSPITAL COURSE:  He is an 77 year old female with proximal atrial  fibrillation.  She does feel irregularity in the heart rhythm.  When she  gets into atrial fibrillation, she also has lightheadedness and  weakness.  It was decided that she would be admitted for amiodarone  loading.  She was placed in the hospital on Dec 12, 2008.  She had PFTs  done that showed minimal diffusion defect - pulmonary vascular.  Her TSH  was 1.3.  She was loaded with amiodarone.  By Dec 15, 2008, she was felt  to be ready for discharge to home.  She was in normal sinus rhythm.  Her  INR was 2.3.   DISCHARGE MEDICATIONS:  1. Baby aspirin 81 mg a day.  2. Methimazole 5 mg a day.  3. Atenolol 50 mg one-half tablet a day.  4. Amiodarone as directed.  5. Metformin as before.  6. Actonel weekly.  7. Vitamin D daily.  8. Vitamin E daily.  9. Pravastatin 10 mg a day.  10.Lisinopril 20 mg a day.  11.Coumadin 2.5 mg one-half tablet daily, this will be half of a 5 mg      tablet.   DIET:  She is to remain on heart-healthy diabetic diet.   ACTIVITY:  Increase activity slowly.   FOLLOWUP:  Follow up with Ysidro Evert in the [facility] on Dec 18, 2008,  at 03:00 p.m. and with Dr. [name] on Dec 28, 2008, at 10:00 a.m.      Joesphine Bare, P.A.      Fransico Him, M.D.  Electronically Signed    LB/MEDQ  D:  01/30/2009  T:  01/31/2009  Job:  [id number]   cc:   Fransico Him, M.D.

## 2010-12-20 NOTE — Op Note (Signed)
[place]  Patient:    Alexa Mcdonald, Alexa Mcdonald                     MRN: [id number] Proc. Date: 11/04/00 Adm. Date:  [id number] Disc. Date: [id number] Attending:  Art Buff                           Operative Report  PREOPERATIVE DIAGNOSIS:  Cataract, left eye.  POSTOPERATIVE DIAGNOSIS:  Cataract, left eye.  OPERATION PERFORMED:  Cataract extraction with intraocular lens implant, OS.  SURGEON:  Linton Rump, M.D.  INDICATIONS FOR PROCEDURE:  The patient is a 77 year old female with painless progressive decrease in vision associated with difficulty for reading.  On examination, she was found to have a dense nuclear sclerotic and cortical cataract consistent with decrease in visual acuity.  DESCRIPTION OF PROCEDURE:  The patient was brought to the main operating room and placed in the supine position.  Anesthesia was obtained by means of topical 4% lidocaine drops with tetracaine.  She was then prepped and draped in the usual manner.  A lid speculum was inserted and the cornea was entered with a diamond keratome superiorly with an additional port superior temporally.  Occucoat was instilled and an anterior capsulorrhexis was performed without difficulty.  The nucleus was mobilized by hydrodissection, phacoemulsified, and residual cortical material was removed by irrigation and aspiration.  The posterior capsule was polished and a posterior chamber lens implant was placed in the bag without difficulty.  Occucoat was instilled and an anterior capsulorrhexis was performed without difficulty.  The nucleus was mobilized by hydrodissection, phacoemulsified, and residual cortical material was removed by irrigation and aspiration.  The posterior capsule was polished, and a posterior chamber lens implant was placed in the bag without difficulty. Occucoat was removed and replaced with balanced salt solution.  The wound was hydrated with balanced salt  solution, checked for fluid leaks, and none were noted.  The eye was dressed with topical Pred-Forte, Ocuflox, Voltaren, and a Fox shield, and the patient was taken to the recovery room in excellent condition where she received written and verbal instructions for her postoperative care, and was scheduled for follow up in 24 hours. DD:  12/30/00 TD:  12/30/00 Job: [id number]

## 2010-12-26 ENCOUNTER — Other Ambulatory Visit: Payer: Self-pay | Admitting: Dermatology

## 2011-01-29 ENCOUNTER — Ambulatory Visit (HOSPITAL_COMMUNITY)
Admission: RE | Admit: 2011-01-29 | Discharge: 2011-01-29 | Disposition: A | Discharge status: Home / Self Care | Payer: Medicare Other | Source: Ambulatory Visit | Attending: Cardiology | Admitting: Cardiology

## 2011-01-29 DIAGNOSIS — I4891 Unspecified atrial fibrillation: Secondary | ICD-10-CM | POA: Insufficient documentation

## 2011-01-29 DIAGNOSIS — Z79899 Other long term (current) drug therapy: Secondary | ICD-10-CM | POA: Insufficient documentation

## 2011-02-28 ENCOUNTER — Other Ambulatory Visit (HOSPITAL_COMMUNITY): Payer: Self-pay | Admitting: Family Medicine

## 2011-02-28 DIAGNOSIS — Z1231 Encounter for screening mammogram for malignant neoplasm of breast: Secondary | ICD-10-CM

## 2011-03-27 ENCOUNTER — Ambulatory Visit (HOSPITAL_COMMUNITY)
Admission: RE | Admit: 2011-03-27 | Discharge: 2011-03-27 | Disposition: A | Discharge status: Home / Self Care | Payer: Medicare Other | Source: Ambulatory Visit | Attending: Family Medicine | Admitting: Family Medicine

## 2011-03-27 DIAGNOSIS — Z1231 Encounter for screening mammogram for malignant neoplasm of breast: Secondary | ICD-10-CM

## 2011-08-07 DIAGNOSIS — Z7901 Long term (current) use of anticoagulants: Secondary | ICD-10-CM | POA: Diagnosis not present

## 2011-08-07 DIAGNOSIS — I4891 Unspecified atrial fibrillation: Secondary | ICD-10-CM | POA: Diagnosis not present

## 2011-09-04 ENCOUNTER — Ambulatory Visit
Admission: RE | Admit: 2011-09-04 | Discharge: 2011-09-04 | Disposition: A | Discharge status: Home / Self Care | Payer: Medicare Other | Source: Ambulatory Visit | Attending: Family Medicine | Admitting: Family Medicine

## 2011-09-04 ENCOUNTER — Other Ambulatory Visit: Payer: Self-pay | Admitting: Family Medicine

## 2011-09-04 DIAGNOSIS — E785 Hyperlipidemia, unspecified: Secondary | ICD-10-CM | POA: Diagnosis not present

## 2011-09-04 DIAGNOSIS — M79659 Pain in unspecified thigh: Secondary | ICD-10-CM

## 2011-09-04 DIAGNOSIS — Z0000 Encounter for general adult medical examination without abnormal findings: Secondary | ICD-10-CM | POA: Diagnosis not present

## 2011-09-04 DIAGNOSIS — I4891 Unspecified atrial fibrillation: Secondary | ICD-10-CM | POA: Diagnosis not present

## 2011-09-04 DIAGNOSIS — Z7901 Long term (current) use of anticoagulants: Secondary | ICD-10-CM | POA: Diagnosis not present

## 2011-09-04 DIAGNOSIS — M810 Age-related osteoporosis without current pathological fracture: Secondary | ICD-10-CM | POA: Diagnosis not present

## 2011-09-04 DIAGNOSIS — I10 Essential (primary) hypertension: Secondary | ICD-10-CM | POA: Diagnosis not present

## 2011-09-04 DIAGNOSIS — N183 Chronic kidney disease, stage 3 unspecified: Secondary | ICD-10-CM | POA: Diagnosis not present

## 2011-09-04 DIAGNOSIS — M79609 Pain in unspecified limb: Secondary | ICD-10-CM | POA: Diagnosis not present

## 2011-09-04 DIAGNOSIS — I499 Cardiac arrhythmia, unspecified: Secondary | ICD-10-CM | POA: Diagnosis not present

## 2011-09-04 DIAGNOSIS — E1129 Type 2 diabetes mellitus with other diabetic kidney complication: Secondary | ICD-10-CM | POA: Diagnosis not present

## 2011-09-04 IMAGING — CR DG FEMUR 2+V*R*
4 series · 4 of 4 positions shown · non-contrast
Comparison: None.

CLINICAL DATA: Mid thigh pain for 2 months.  No known injury.

RIGHT FEMUR - 2 VIEW

[t femur with hip  ap right]
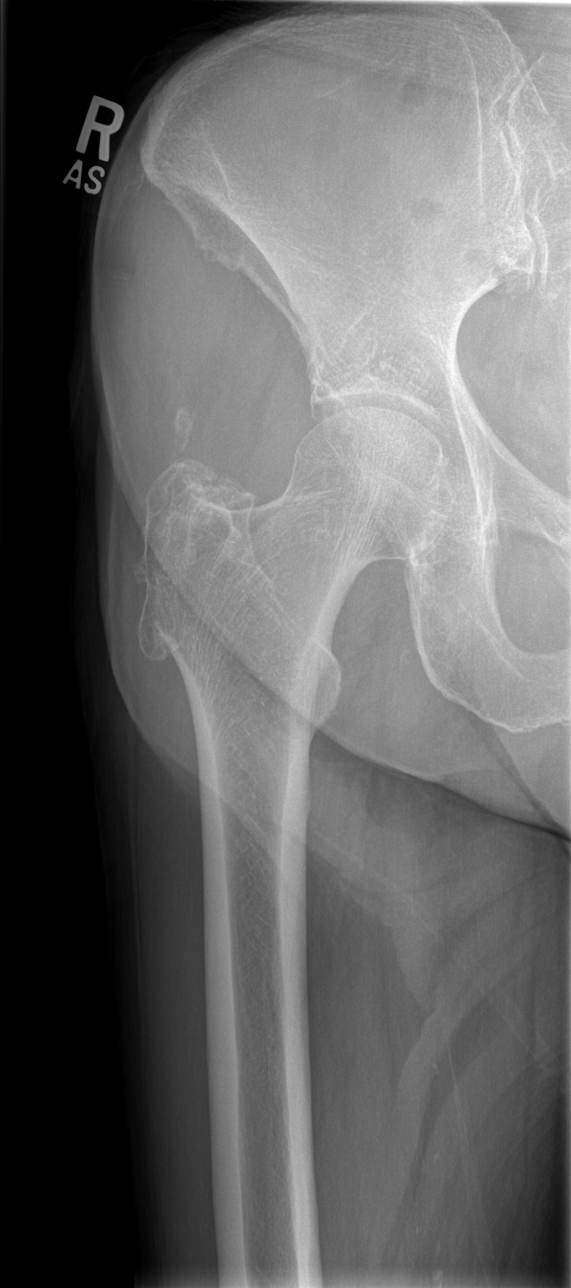

[t femur with knee ap right]
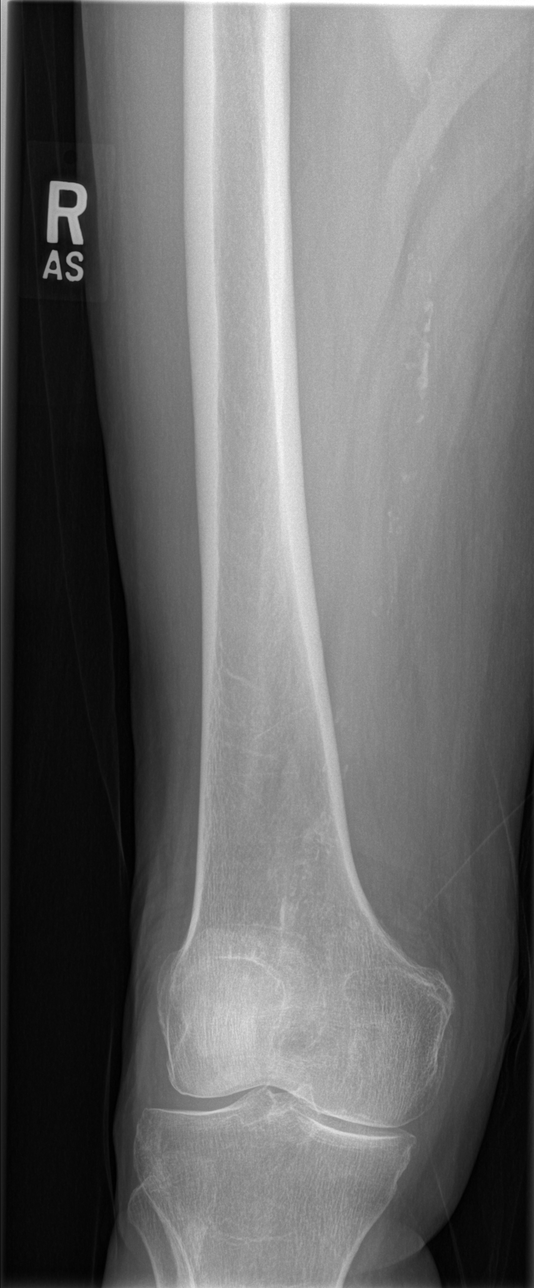

[t femur with hip lat right]
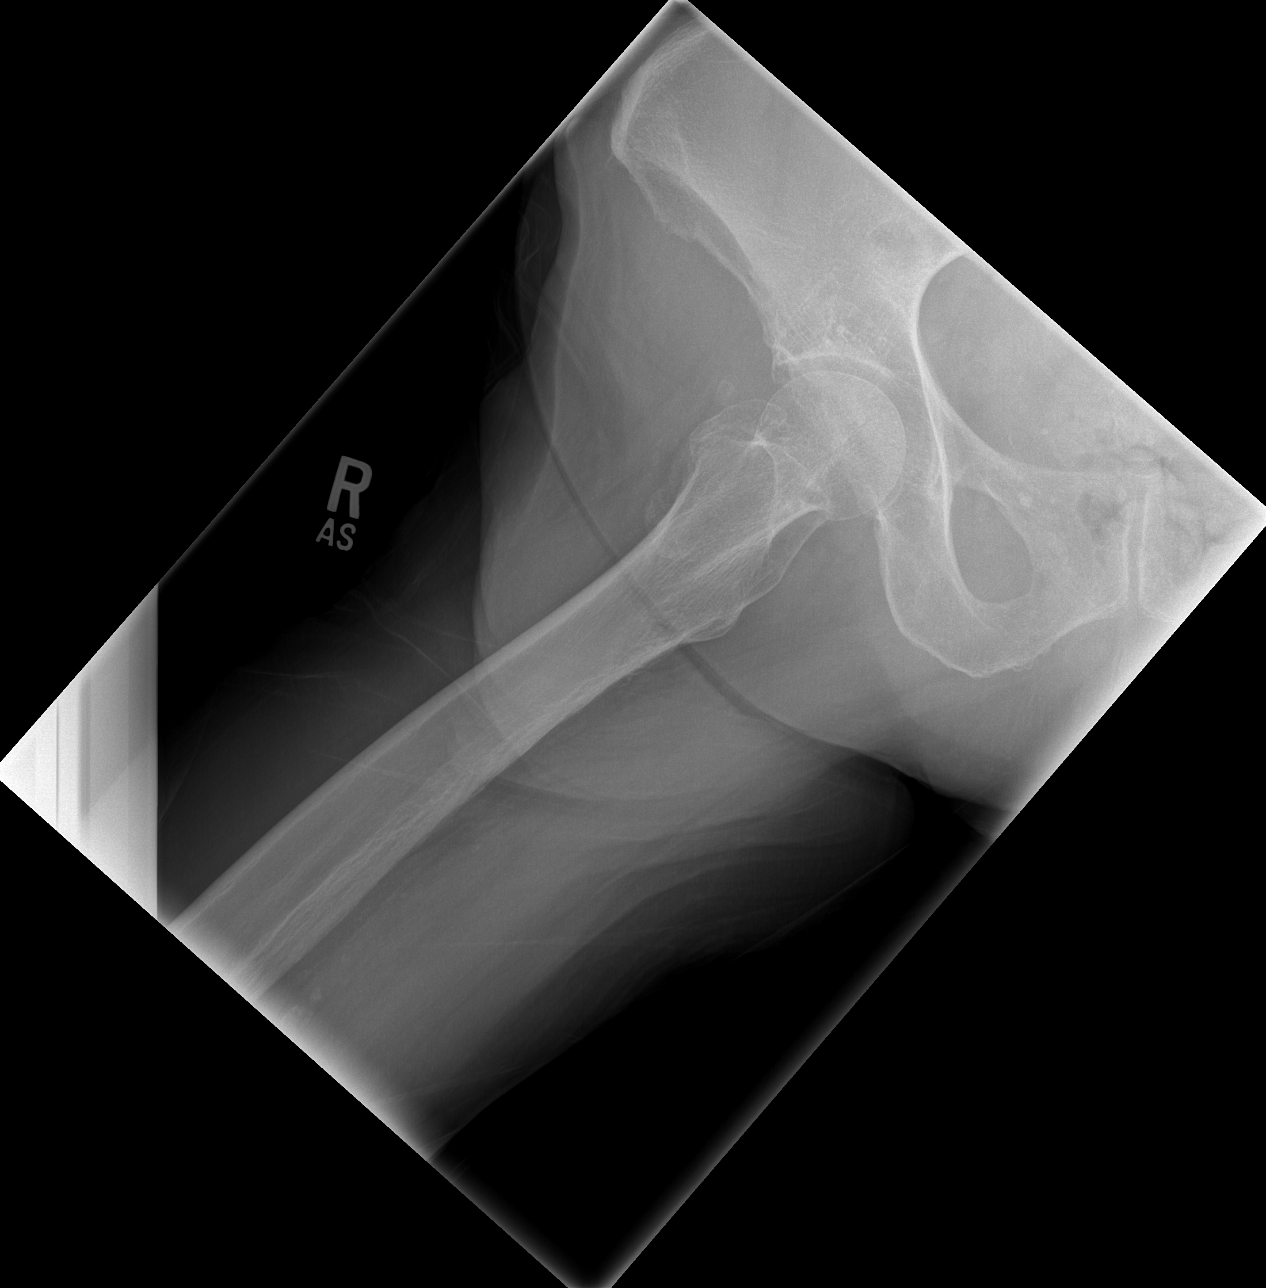

[t femur with knee lat right]
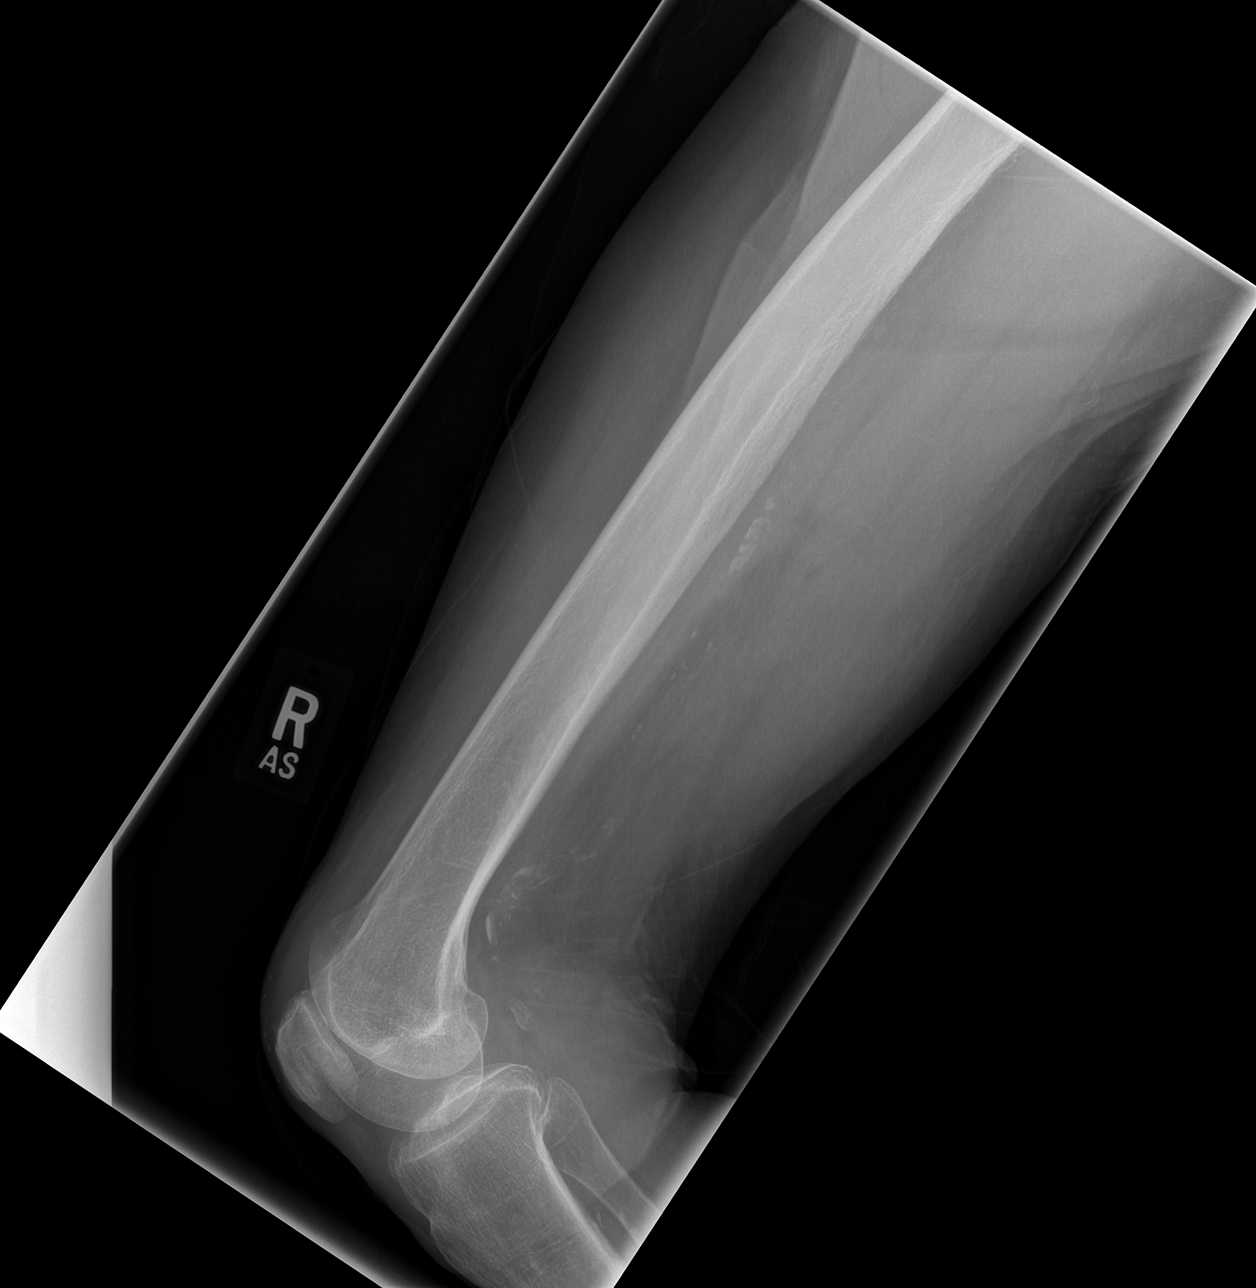

[4 of 4 positions shown; findings below may reference images not displayed]

FINDINGS: There is generalized osteopenia.  The right femoral head
and hip joint appear unremarkable for age.  There are small
fragmented enthesophytes adjacent to the greater trochanter.  There
is mild medial joint space loss at the knee.  No acute fracture or
dislocation is demonstrated.  There are diffuse vascular
calcifications.
IMPRESSION: No acute osseous findings or advanced arthropathic changes for age.
Diffuse vascular calcifications.

## 2011-09-15 DIAGNOSIS — Z1211 Encounter for screening for malignant neoplasm of colon: Secondary | ICD-10-CM | POA: Diagnosis not present

## 2011-10-02 DIAGNOSIS — I4891 Unspecified atrial fibrillation: Secondary | ICD-10-CM | POA: Diagnosis not present

## 2011-10-02 DIAGNOSIS — Z7901 Long term (current) use of anticoagulants: Secondary | ICD-10-CM | POA: Diagnosis not present

## 2011-10-21 ENCOUNTER — Encounter: Payer: Self-pay | Admitting: Internal Medicine

## 2011-10-21 DIAGNOSIS — I4891 Unspecified atrial fibrillation: Secondary | ICD-10-CM | POA: Diagnosis not present

## 2011-10-21 DIAGNOSIS — E0520 Thyrotoxicosis with toxic multinodular goiter without thyrotoxic crisis or storm: Secondary | ICD-10-CM | POA: Diagnosis not present

## 2011-10-30 DIAGNOSIS — I4891 Unspecified atrial fibrillation: Secondary | ICD-10-CM | POA: Diagnosis not present

## 2011-10-30 DIAGNOSIS — Z7901 Long term (current) use of anticoagulants: Secondary | ICD-10-CM | POA: Diagnosis not present

## 2011-11-14 ENCOUNTER — Encounter: Payer: Self-pay | Admitting: Internal Medicine

## 2011-11-20 DIAGNOSIS — L578 Other skin changes due to chronic exposure to nonionizing radiation: Secondary | ICD-10-CM | POA: Diagnosis not present

## 2011-11-20 DIAGNOSIS — D239 Other benign neoplasm of skin, unspecified: Secondary | ICD-10-CM | POA: Diagnosis not present

## 2011-11-20 DIAGNOSIS — L821 Other seborrheic keratosis: Secondary | ICD-10-CM | POA: Diagnosis not present

## 2011-11-20 DIAGNOSIS — L570 Actinic keratosis: Secondary | ICD-10-CM | POA: Diagnosis not present

## 2011-11-27 ENCOUNTER — Encounter: Payer: Self-pay | Admitting: Internal Medicine

## 2011-11-27 ENCOUNTER — Ambulatory Visit (INDEPENDENT_AMBULATORY_CARE_PROVIDER_SITE_OTHER): Payer: Medicare Other | Admitting: Internal Medicine

## 2011-11-27 VITALS — BP 130/68 | HR 68 | Resp 18 | Ht 65.0 in | Wt 135.8 lb

## 2011-11-27 DIAGNOSIS — R001 Bradycardia, unspecified: Secondary | ICD-10-CM

## 2011-11-27 DIAGNOSIS — I4891 Unspecified atrial fibrillation: Secondary | ICD-10-CM | POA: Diagnosis not present

## 2011-11-27 DIAGNOSIS — I498 Other specified cardiac arrhythmias: Secondary | ICD-10-CM

## 2011-11-27 DIAGNOSIS — Z7901 Long term (current) use of anticoagulants: Secondary | ICD-10-CM | POA: Diagnosis not present

## 2011-11-27 NOTE — Patient Instructions (Signed)
Your physician recommends that you schedule a follow-up appointment in 3 months with Dr Allred    

## 2011-11-27 NOTE — Assessment & Plan Note (Signed)
Recent event monitor is reviewed in detail.  Though she did have some bradycardia, the majority of her heart rates were above 50 bpm.  She did not have AV block or prolonged pauses.  She denies symptoms with bradycardia.  She is very clear that she would like to avoid pacemaker implantation.  Given her advanced age, infrequent bradycardia, and paucity of symptoms, I think that it is quite reasonable to avoid PPM at this point.  IF she develops worsening bradycardia or advanced AV block then she may eventually require PPM implantation.  I will see her once more in 3 months to further assess the above.  If she is doing well at that time, then I will return her care in its entirety back to Dr [name].  The patient is instructed to contact me if she develops symptoms of afib or bradycardia in the interim.

## 2011-11-27 NOTE — Progress Notes (Signed)
Primary Care Physician: [name], MD, MD Referring Physician:  Dr Arville Go is a 78 y.o. female with a h/o paroxysmal atrial fibrillation and bradycardia who presents today for EP consultation.  She reports initially being diagnosed with atrial fibrillation 3-4 years after presenting with palpitations.  She was place on amiodarone and warfarin at that time.  She has done quite well.  She has been maintained on amiodarone 150mg  daily chronically.  She recently developed increased "fluttering".  She had an event monitor placed by Dr [name].  This documented atrial fibrillation.    Presently, she feels "pretty good".  She has occasional lightheadedness particularly when her blood pressure drops.  She finds that this occasionally happens in the afternoon.  This is a rare event for her.  She remains very active despite her age.  Today, she denies symptoms of frequent palpitations, chest pain, shortness of breath,  lower extremity edema, presyncope, syncope, or neurologic sequela. The patient is tolerating medications without difficulties and is otherwise without complaint today.   Past Medical History  Diagnosis Date  . Mediastinal goiter     removed 1988  . CKD (chronic kidney disease)     stage III  . Osteopenia   . Osteoporosis   . Vitamin d deficiency   . Paroxysmal atrial fibrillation   . Hypertension   . LBBB (left bundle branch block)   . Aortic valve sclerosis   . Hx of orthostatic hypotension   . Colon, diverticulosis   . Tachycardia-bradycardia   . Diabetes mellitus     diet controlled   Past Surgical History  Procedure Date  . Cataract extraction   . Carpal tunnel release   . Mri 08/18/07    head (diabetes heart [facility] at [facility])  . Mediastinal removal of a goiter     Current Outpatient Prescriptions  Medication Sig Dispense Refill  . amiodarone (PACERONE) 100 MG tablet Take 150 mg by mouth daily.       [name] aspirin 81 MG tablet Take 81 mg by mouth  daily.      . calcium carbonate (OS-CAL) 600 MG TABS Take 600 mg by mouth 2 (two) times daily with a meal.      . cholecalciferol (VITAMIN D) 1000 UNITS tablet Take 1,000 Units by mouth daily.      [name] lisinopril (PRINIVIL,ZESTRIL) 30 MG tablet Take 30 mg by mouth daily.      . methimazole (TAPAZOLE) 10 MG tablet Take 10 mg by mouth 3 (three) times daily.      . pravastatin (PRAVACHOL) 10 MG tablet Take 10 mg by mouth daily.      . risedronate (ACTONEL) 35 MG tablet Take 35 mg by mouth every 7 (seven) days. with water on empty stomach, nothing by mouth or lie down for next 30 minutes.      . vitamin E 400 UNIT capsule Take 800 Units by mouth daily.      [name] warfarin (COUMADIN) 2.5 MG tablet Take 2.5 mg by mouth daily. Take 1 1/2 tablets 4 days a week; then 1 tablet on 3 days week.         Allergies  Allergen Reactions  . Amoxil     tongue felt bruised.  . Celebrex (Celecoxib)     Mouth sores    History   Social History  . Marital Status: Widowed    Spouse Name: N/A    Number of Children: 3  . Years of Education: N/A  Occupational History  . retire    Social History Main Topics  . Smoking status: Never Smoker   . Smokeless tobacco: Not on file  . Alcohol Use: No  . Drug Use: No  . Sexually Active: Not on file   Other Topics Concern  . Not on file   Social History Narrative   Lives in [place].  Widowed.    FH- HTN  ROS- All systems are reviewed and negative except as per the HPI above  Physical Exam: Filed Vitals:   11/27/11 1506  BP: 130/68  Pulse: 68  Resp: 18  Height: 5\' 5"  (1.651 m)  Weight: 135 lb 12.8 oz (61.598 kg)    GEN- The patient is well appearing, alert and oriented x 3 today.  She appears much younger than her stated age Head- normocephalic, atraumatic Eyes-  Sclera clear, conjunctiva pink Ears- hearing intact Oropharynx- clear Neck- supple, no JVP Lymph- no cervical lymphadenopathy Lungs- Clear to ausculation bilaterally, normal work of  breathing Heart- Regular rate and rhythm, no murmurs, rubs or gallops, PMI not laterally displaced GI- soft, NT, ND, + BS Extremities- no clubbing, cyanosis, or edema MS- no significant deformity or atrophy Skin- no rash or lesion Psych- euthymic mood, full affect Neuro- strength and sensation are intact  EKG today reveals sinus rhythm 68 bpm, PR 190, with LBBB Event monitor 3/13 is reviewed from Dr Theodosia Blender office which reveals predominantly sinus rhythm though she did have afib, though with a low burden.  Majority of heart rates are above 50 bpm with no significant RVR.  No prolonged pauses.   Assessment and Plan:

## 2011-11-27 NOTE — Assessment & Plan Note (Signed)
Very pleasant 78 yo female who appears much younger than her stated age.  She is referred for further management of afib and bradycardia.  She has a left bundle branch block but no AV block observed on her recent event monitor.  Though she did have some afib on her recent event monitor, presently, she feels that her symptoms with afib are well controlled.  She is not interested in increasing her amiodarone or adjusting her medicine at this time.  She is appropriately anticoagulated.  No changes are planned. If her afib becomes more symptomatic, then she may benefit from increasing amiodarone to 200mg  daily at that point.

## 2011-12-24 DIAGNOSIS — E119 Type 2 diabetes mellitus without complications: Secondary | ICD-10-CM | POA: Diagnosis not present

## 2012-01-19 DIAGNOSIS — I4891 Unspecified atrial fibrillation: Secondary | ICD-10-CM | POA: Diagnosis not present

## 2012-01-19 DIAGNOSIS — M79609 Pain in unspecified limb: Secondary | ICD-10-CM | POA: Diagnosis not present

## 2012-01-19 DIAGNOSIS — Z7901 Long term (current) use of anticoagulants: Secondary | ICD-10-CM | POA: Diagnosis not present

## 2012-01-19 DIAGNOSIS — Z1211 Encounter for screening for malignant neoplasm of colon: Secondary | ICD-10-CM | POA: Diagnosis not present

## 2012-01-19 DIAGNOSIS — I471 Supraventricular tachycardia: Secondary | ICD-10-CM | POA: Diagnosis not present

## 2012-01-19 DIAGNOSIS — M810 Age-related osteoporosis without current pathological fracture: Secondary | ICD-10-CM | POA: Diagnosis not present

## 2012-01-19 DIAGNOSIS — E0520 Thyrotoxicosis with toxic multinodular goiter without thyrotoxic crisis or storm: Secondary | ICD-10-CM | POA: Diagnosis not present

## 2012-01-19 DIAGNOSIS — R0989 Other specified symptoms and signs involving the circulatory and respiratory systems: Secondary | ICD-10-CM | POA: Diagnosis not present

## 2012-01-19 DIAGNOSIS — Z79899 Other long term (current) drug therapy: Secondary | ICD-10-CM | POA: Diagnosis not present

## 2012-01-19 DIAGNOSIS — Z0000 Encounter for general adult medical examination without abnormal findings: Secondary | ICD-10-CM | POA: Diagnosis not present

## 2012-01-19 DIAGNOSIS — I10 Essential (primary) hypertension: Secondary | ICD-10-CM | POA: Diagnosis not present

## 2012-01-19 DIAGNOSIS — R011 Cardiac murmur, unspecified: Secondary | ICD-10-CM | POA: Diagnosis not present

## 2012-01-19 DIAGNOSIS — I499 Cardiac arrhythmia, unspecified: Secondary | ICD-10-CM | POA: Diagnosis not present

## 2012-01-23 DIAGNOSIS — R0989 Other specified symptoms and signs involving the circulatory and respiratory systems: Secondary | ICD-10-CM | POA: Diagnosis not present

## 2012-01-26 ENCOUNTER — Ambulatory Visit (HOSPITAL_COMMUNITY)
Admission: RE | Admit: 2012-01-26 | Discharge: 2012-01-26 | Disposition: A | Discharge status: Home / Self Care | Payer: Medicare Other | Source: Ambulatory Visit | Attending: Cardiology | Admitting: Cardiology

## 2012-01-26 DIAGNOSIS — I4891 Unspecified atrial fibrillation: Secondary | ICD-10-CM | POA: Insufficient documentation

## 2012-01-26 DIAGNOSIS — Z79899 Other long term (current) drug therapy: Secondary | ICD-10-CM | POA: Diagnosis not present

## 2012-01-27 DIAGNOSIS — Z7901 Long term (current) use of anticoagulants: Secondary | ICD-10-CM | POA: Diagnosis not present

## 2012-01-27 DIAGNOSIS — I447 Left bundle-branch block, unspecified: Secondary | ICD-10-CM | POA: Diagnosis not present

## 2012-01-27 DIAGNOSIS — I951 Orthostatic hypotension: Secondary | ICD-10-CM | POA: Diagnosis not present

## 2012-01-27 DIAGNOSIS — I359 Nonrheumatic aortic valve disorder, unspecified: Secondary | ICD-10-CM | POA: Diagnosis not present

## 2012-01-27 DIAGNOSIS — I10 Essential (primary) hypertension: Secondary | ICD-10-CM | POA: Diagnosis not present

## 2012-01-27 DIAGNOSIS — I4891 Unspecified atrial fibrillation: Secondary | ICD-10-CM | POA: Diagnosis not present

## 2012-02-09 DIAGNOSIS — I951 Orthostatic hypotension: Secondary | ICD-10-CM | POA: Diagnosis not present

## 2012-02-09 DIAGNOSIS — I4891 Unspecified atrial fibrillation: Secondary | ICD-10-CM | POA: Diagnosis not present

## 2012-02-09 DIAGNOSIS — I10 Essential (primary) hypertension: Secondary | ICD-10-CM | POA: Diagnosis not present

## 2012-02-16 DIAGNOSIS — I951 Orthostatic hypotension: Secondary | ICD-10-CM | POA: Diagnosis not present

## 2012-02-16 DIAGNOSIS — I4891 Unspecified atrial fibrillation: Secondary | ICD-10-CM | POA: Diagnosis not present

## 2012-02-16 DIAGNOSIS — Z7901 Long term (current) use of anticoagulants: Secondary | ICD-10-CM | POA: Diagnosis not present

## 2012-02-16 DIAGNOSIS — I10 Essential (primary) hypertension: Secondary | ICD-10-CM | POA: Diagnosis not present

## 2012-02-19 DIAGNOSIS — M25559 Pain in unspecified hip: Secondary | ICD-10-CM | POA: Diagnosis not present

## 2012-02-26 DIAGNOSIS — I517 Cardiomegaly: Secondary | ICD-10-CM | POA: Diagnosis not present

## 2012-02-26 DIAGNOSIS — I359 Nonrheumatic aortic valve disorder, unspecified: Secondary | ICD-10-CM | POA: Diagnosis not present

## 2012-02-26 DIAGNOSIS — E1129 Type 2 diabetes mellitus with other diabetic kidney complication: Secondary | ICD-10-CM | POA: Diagnosis not present

## 2012-02-26 DIAGNOSIS — N183 Chronic kidney disease, stage 3 unspecified: Secondary | ICD-10-CM | POA: Diagnosis not present

## 2012-02-27 ENCOUNTER — Other Ambulatory Visit (HOSPITAL_COMMUNITY): Payer: Self-pay | Admitting: Family Medicine

## 2012-02-27 DIAGNOSIS — Z1231 Encounter for screening mammogram for malignant neoplasm of breast: Secondary | ICD-10-CM

## 2012-03-01 ENCOUNTER — Ambulatory Visit (INDEPENDENT_AMBULATORY_CARE_PROVIDER_SITE_OTHER): Payer: Medicare Other | Admitting: Internal Medicine

## 2012-03-01 ENCOUNTER — Encounter: Payer: Self-pay | Admitting: Internal Medicine

## 2012-03-01 VITALS — BP 136/68 | HR 55 | Resp 18 | Ht 67.0 in | Wt 135.0 lb

## 2012-03-01 DIAGNOSIS — I498 Other specified cardiac arrhythmias: Secondary | ICD-10-CM

## 2012-03-01 DIAGNOSIS — I4891 Unspecified atrial fibrillation: Secondary | ICD-10-CM

## 2012-03-01 DIAGNOSIS — R001 Bradycardia, unspecified: Secondary | ICD-10-CM

## 2012-03-01 NOTE — Patient Instructions (Addendum)
Your physician recommends that you schedule a follow-up appointment in 3 months with Dr Allred    

## 2012-03-07 NOTE — Progress Notes (Signed)
PCP:  [name], MD Primary Cardiologist:  Dr [name]  The patient presents today for routine electrophysiology followup.  Since last being seen in our clinic, the patient reports doing reasonably well.   She states that she did have one episode of presyncope while standing in the kitchen.  She felt "very weak" and had to sit down.  She did not pass out with this episode.  She denies any further episodes since that time.  Today, she denies symptoms of palpitations, chest pain, shortness of breath, orthopnea, lower extremity edema,or neurologic sequela.  The patient feels that she is tolerating medications without difficulties and is otherwise without complaint today.   Past Medical History  Diagnosis Date  . Mediastinal goiter     removed 1988  . CKD (chronic kidney disease)     stage III  . Osteopenia   . Osteoporosis   . Vitamin d deficiency   . Paroxysmal atrial fibrillation   . Hypertension   . LBBB (left bundle branch block)   . Aortic valve sclerosis   . Hx of orthostatic hypotension   . Colon, diverticulosis   . Tachycardia-bradycardia   . Diabetes mellitus     diet controlled   Past Surgical History  Procedure Date  . Cataract extraction   . Carpal tunnel release   . Mri 08/18/07    head (diabetes heart study at [facility])  . Mediastinal removal of a goiter     Current Outpatient Prescriptions  Medication Sig Dispense Refill  . amiodarone (PACERONE) 100 MG tablet Take 150 mg by mouth daily.       [name] aspirin 81 MG tablet Take 81 mg by mouth daily.      . calcium carbonate (OS-CAL) 600 MG TABS Take 600 mg by mouth 2 (two) times daily with a meal.      . cholecalciferol (VITAMIN D) 1000 UNITS tablet Take 1,000 Units by mouth daily.      [name] lisinopril (PRINIVIL,ZESTRIL) 30 MG tablet Take 30 mg by mouth daily.      . methimazole (TAPAZOLE) 10 MG tablet Take 10 mg by mouth 3 (three) times daily.      . pravastatin (PRAVACHOL) 10 MG tablet Take 10 mg by mouth daily.        . risedronate (ACTONEL) 35 MG tablet Take 35 mg by mouth every 7 (seven) days. with water on empty stomach, nothing by mouth or lie down for next 30 minutes.      . vitamin E 400 UNIT capsule Take 800 Units by mouth daily.      [name] warfarin (COUMADIN) 2.5 MG tablet Take 2.5 mg by mouth daily. Take 1 1/2 tablets 4 days a week; then 1 tablet on 3 days week.         Allergies  Allergen Reactions  . Amoxicillin     tongue felt bruised.  . Celebrex (Celecoxib)     Mouth sores    History   Social History  . Marital Status: Widowed    Spouse Name: N/A    Number of Children: 3  . Years of Education: N/A   Occupational History  . retire    Social History Main Topics  . Smoking status: Never Smoker   . Smokeless tobacco: Not on file  . Alcohol Use: No  . Drug Use: No  . Sexually Active: Not on file   Other Topics Concern  . Not on file   Social History Narrative   Lives in  [place].  Widowed.    No family history on file.  ROS-  All systems are reviewed and are negative except as outlined in the HPI above   Physical Exam: Filed Vitals:   03/01/12 1429  BP: 136/68  Pulse: 55  Resp: 18  Height: 5\' 7"  (1.702 m)  Weight: 135 lb (61.236 kg)  SpO2: 96%    GEN- The patient is elderly appearing, alert and oriented x 3 today.   Head- normocephalic, atraumatic Eyes-  Sclera clear, conjunctiva pink Ears- hearing intact Oropharynx- clear Lungs- Clear to ausculation bilaterally, normal work of breathing Heart- RRR,  2/6 SEM LUSB (late peaking) GI- soft, NT, ND, + BS Extremities- no clubbing, cyanosis, or edema MS- age appropriate atrophy Skin- no rash or lesion Psych- euthymic mood, full affect Neuro- strength and sensation are intact  ekg today reveals sinus bradycardia 55 bpm, PR 200, LBBB (QRS 152) Echo 01/19/12- EF 53%, mild LVH, mild LA enlargement, mild MR, moderate to severe AS,   Assessment and Plan:

## 2012-03-07 NOTE — Assessment & Plan Note (Signed)
Maintaining sinus rhythm No changes today

## 2012-03-07 NOTE — Assessment & Plan Note (Signed)
She has stable bradycardia.  Given her episode of presyncope, I have recommended PPM implantation at this time.  She is very clear however that she would like to avoid PPM.  We will therefore continue her current medicine regimen.  She will contact me immediately should she have presyncope or syncope.  No changes are made today.

## 2012-03-09 NOTE — Addendum Note (Signed)
Addended by: Laurence Compton on: 03/09/2012 04:13 PM   Modules accepted: Orders

## 2012-03-11 DIAGNOSIS — I4891 Unspecified atrial fibrillation: Secondary | ICD-10-CM | POA: Diagnosis not present

## 2012-03-11 DIAGNOSIS — Z7901 Long term (current) use of anticoagulants: Secondary | ICD-10-CM | POA: Diagnosis not present

## 2012-03-15 DIAGNOSIS — M25559 Pain in unspecified hip: Secondary | ICD-10-CM | POA: Diagnosis not present

## 2012-03-20 DIAGNOSIS — M25559 Pain in unspecified hip: Secondary | ICD-10-CM | POA: Diagnosis not present

## 2012-03-26 DIAGNOSIS — M25559 Pain in unspecified hip: Secondary | ICD-10-CM | POA: Diagnosis not present

## 2012-03-29 ENCOUNTER — Ambulatory Visit (HOSPITAL_COMMUNITY)
Admission: RE | Admit: 2012-03-29 | Discharge: 2012-03-29 | Disposition: A | Discharge status: Home / Self Care | Payer: Medicare Other | Source: Ambulatory Visit | Attending: Family Medicine | Admitting: Family Medicine

## 2012-03-29 DIAGNOSIS — Z1231 Encounter for screening mammogram for malignant neoplasm of breast: Secondary | ICD-10-CM | POA: Diagnosis not present

## 2012-04-08 DIAGNOSIS — I4891 Unspecified atrial fibrillation: Secondary | ICD-10-CM | POA: Diagnosis not present

## 2012-04-08 DIAGNOSIS — Z7901 Long term (current) use of anticoagulants: Secondary | ICD-10-CM | POA: Diagnosis not present

## 2012-05-06 ENCOUNTER — Other Ambulatory Visit: Payer: Self-pay | Admitting: Dermatology

## 2012-05-06 DIAGNOSIS — L821 Other seborrheic keratosis: Secondary | ICD-10-CM | POA: Diagnosis not present

## 2012-05-06 DIAGNOSIS — I4891 Unspecified atrial fibrillation: Secondary | ICD-10-CM | POA: Diagnosis not present

## 2012-05-06 DIAGNOSIS — Z85828 Personal history of other malignant neoplasm of skin: Secondary | ICD-10-CM | POA: Diagnosis not present

## 2012-05-06 DIAGNOSIS — D485 Neoplasm of uncertain behavior of skin: Secondary | ICD-10-CM | POA: Diagnosis not present

## 2012-05-06 DIAGNOSIS — Z7901 Long term (current) use of anticoagulants: Secondary | ICD-10-CM | POA: Diagnosis not present

## 2012-05-06 DIAGNOSIS — C44320 Squamous cell carcinoma of skin of unspecified parts of face: Secondary | ICD-10-CM | POA: Diagnosis not present

## 2012-05-19 DIAGNOSIS — E0520 Thyrotoxicosis with toxic multinodular goiter without thyrotoxic crisis or storm: Secondary | ICD-10-CM | POA: Diagnosis not present

## 2012-05-19 DIAGNOSIS — Z23 Encounter for immunization: Secondary | ICD-10-CM | POA: Diagnosis not present

## 2012-05-26 DIAGNOSIS — C44320 Squamous cell carcinoma of skin of unspecified parts of face: Secondary | ICD-10-CM | POA: Diagnosis not present

## 2012-06-02 ENCOUNTER — Ambulatory Visit (INDEPENDENT_AMBULATORY_CARE_PROVIDER_SITE_OTHER): Payer: Medicare Other | Admitting: Internal Medicine

## 2012-06-02 VITALS — BP 135/60 | HR 67 | Ht 61.0 in | Wt 133.0 lb

## 2012-06-02 DIAGNOSIS — I4891 Unspecified atrial fibrillation: Secondary | ICD-10-CM | POA: Diagnosis not present

## 2012-06-02 DIAGNOSIS — I498 Other specified cardiac arrhythmias: Secondary | ICD-10-CM | POA: Diagnosis not present

## 2012-06-02 DIAGNOSIS — R001 Bradycardia, unspecified: Secondary | ICD-10-CM

## 2012-06-02 NOTE — Patient Instructions (Signed)
Your physician recommends that you schedule a follow-up appointment in 3 months with Dr Allred    

## 2012-06-02 NOTE — Assessment & Plan Note (Signed)
She has stable bradycardia.  She has a first degree AV block and LBBB which also suggests significant conduction disease.  She continues to want to avoid PPM implantation at this time.   We will therefore continue her current medicine regimen.  She will contact me immediately should she have presyncope or syncope.  No changes are made today.

## 2012-06-02 NOTE — Assessment & Plan Note (Signed)
Continue  Coumadin Stop ASA

## 2012-06-02 NOTE — Progress Notes (Signed)
PCP:  [name], MD Primary Cardiologist:  Dr [name]  The patient presents today for routine electrophysiology followup.  Since last being seen in our clinic, the patient reports doing reasonably well.  She denies any further episodes of syncope or presyncope since her last visit.  Her energy is occasionally depressed.  Today, she denies symptoms of palpitations, chest pain, shortness of breath, orthopnea, lower extremity edema,or neurologic sequela.  The patient feels that she is tolerating medications without difficulties and is otherwise without complaint today.   Past Medical History  Diagnosis Date  . Mediastinal goiter     removed 1988  . CKD (chronic kidney disease)     stage III  . Osteopenia   . Osteoporosis   . Vitamin d deficiency   . Paroxysmal atrial fibrillation   . Hypertension   . LBBB (left bundle branch block)   . Aortic valve sclerosis   . Hx of orthostatic hypotension   . Colon, diverticulosis   . Tachycardia-bradycardia   . Diabetes mellitus     diet controlled   Past Surgical History  Procedure Date  . Cataract extraction   . Carpal tunnel release   . Mri 08/18/07    head (di[facility] heart [facility])  . Mediastinal removal of a goiter     Current Outpatient Prescriptions  Medication Sig Dispense Refill  . amiodarone (PACERONE) 100 MG tablet Take 150 mg by mouth daily.       [name] aspirin 81 MG tablet Take 81 mg by mouth daily.      . calcium carbonate (OS-CAL) 600 MG TABS Take 600 mg by mouth 2 (two) times daily with a meal.      . cholecalciferol (VITAMIN D) 1000 UNITS tablet Take 1,000 Units by mouth daily.      [name] lisinopril (PRINIVIL,ZESTRIL) 30 MG tablet Take 20 mg by mouth. Take one half daily      . methimazole (TAPAZOLE) 10 MG tablet Take 10 mg by mouth 3 (three) times daily.      . pravastatin (PRAVACHOL) 10 MG tablet Take 10 mg by mouth daily.      . risedronate (ACTONEL) 35 MG tablet Take 35 mg by mouth every 7 (seven) days. with  water on empty stomach, nothing by mouth or lie down for next 30 minutes.      . vitamin E 400 UNIT capsule Take 800 Units by mouth daily.      [name] warfarin (COUMADIN) 2.5 MG tablet Take 2.5 mg by mouth daily. Take 1 1/2 tablets 4 days a week; then 1 tablet on 3 days week.       . clindamycin (CLEOCIN) 300 MG capsule         Allergies  Allergen Reactions  . Amoxicillin     tongue felt bruised.  . Celebrex (Celecoxib)     Mouth sores    History   Social History  . Marital Status: Widowed    Spouse Name: N/A    Number of Children: 3  . Years of Education: N/A   Occupational History  . retire    Social History Main Topics  . Smoking status: Never Smoker   . Smokeless tobacco: Not on file  . Alcohol Use: No  . Drug Use: No  . Sexually Active: Not on file   Other Topics Concern  . Not on file   Social History Narrative   Lives in [place].  Widowed.    Physical Exam: Filed Vitals:  06/02/12 1437  BP: 135/60  Pulse: 67  Height: 5\' 1"  (1.549 m)  Weight: 133 lb (60.328 kg)    GEN- The patient is elderly appearing, alert and oriented x 3 today.   Head- normocephalic, atraumatic Eyes-  Sclera clear, conjunctiva pink Ears- hearing intact Oropharynx- clear Lungs- Clear to ausculation bilaterally, normal work of breathing Heart- RRR,  2/6 SEM LUSB (late peaking) GI- soft, NT, ND, + BS Extremities- no clubbing, cyanosis, or edema MS- age appropriate atrophy Skin- no rash or lesion Psych- euthymic mood, full affect Neuro- strength and sensation are intact  ekg today reveals sinus bradycardia 53 bpm, PR 220, LBBB (QRS 150) Echo 01/19/12- EF 53%, mild LVH, mild LA enlargement, mild MR, moderate to severe AS,   Assessment and Plan:

## 2012-06-06 ENCOUNTER — Encounter (HOSPITAL_COMMUNITY): Payer: Self-pay

## 2012-06-06 ENCOUNTER — Emergency Department (HOSPITAL_COMMUNITY)
Admission: EM | Admit: 2012-06-06 | Discharge: 2012-06-06 | Disposition: A | Discharge status: Home / Self Care | Payer: Medicare Other | Attending: Emergency Medicine | Admitting: Emergency Medicine

## 2012-06-06 ENCOUNTER — Emergency Department (HOSPITAL_COMMUNITY): Payer: Medicare Other

## 2012-06-06 DIAGNOSIS — I447 Left bundle-branch block, unspecified: Secondary | ICD-10-CM | POA: Insufficient documentation

## 2012-06-06 DIAGNOSIS — I129 Hypertensive chronic kidney disease with stage 1 through stage 4 chronic kidney disease, or unspecified chronic kidney disease: Secondary | ICD-10-CM | POA: Diagnosis not present

## 2012-06-06 DIAGNOSIS — Z23 Encounter for immunization: Secondary | ICD-10-CM | POA: Insufficient documentation

## 2012-06-06 DIAGNOSIS — M25519 Pain in unspecified shoulder: Secondary | ICD-10-CM | POA: Diagnosis not present

## 2012-06-06 DIAGNOSIS — E119 Type 2 diabetes mellitus without complications: Secondary | ICD-10-CM | POA: Diagnosis not present

## 2012-06-06 DIAGNOSIS — S82009A Unspecified fracture of unspecified patella, initial encounter for closed fracture: Secondary | ICD-10-CM | POA: Diagnosis not present

## 2012-06-06 DIAGNOSIS — I4891 Unspecified atrial fibrillation: Secondary | ICD-10-CM | POA: Diagnosis not present

## 2012-06-06 DIAGNOSIS — Z7901 Long term (current) use of anticoagulants: Secondary | ICD-10-CM | POA: Diagnosis not present

## 2012-06-06 DIAGNOSIS — M949 Disorder of cartilage, unspecified: Secondary | ICD-10-CM | POA: Insufficient documentation

## 2012-06-06 DIAGNOSIS — M899 Disorder of bone, unspecified: Secondary | ICD-10-CM | POA: Insufficient documentation

## 2012-06-06 DIAGNOSIS — M810 Age-related osteoporosis without current pathological fracture: Secondary | ICD-10-CM | POA: Diagnosis not present

## 2012-06-06 DIAGNOSIS — N183 Chronic kidney disease, stage 3 unspecified: Secondary | ICD-10-CM | POA: Insufficient documentation

## 2012-06-06 DIAGNOSIS — Z79899 Other long term (current) drug therapy: Secondary | ICD-10-CM | POA: Diagnosis not present

## 2012-06-06 DIAGNOSIS — I359 Nonrheumatic aortic valve disorder, unspecified: Secondary | ICD-10-CM | POA: Diagnosis not present

## 2012-06-06 DIAGNOSIS — Z8719 Personal history of other diseases of the digestive system: Secondary | ICD-10-CM | POA: Diagnosis not present

## 2012-06-06 DIAGNOSIS — S4980XA Other specified injuries of shoulder and upper arm, unspecified arm, initial encounter: Secondary | ICD-10-CM | POA: Diagnosis not present

## 2012-06-06 DIAGNOSIS — R52 Pain, unspecified: Secondary | ICD-10-CM | POA: Diagnosis not present

## 2012-06-06 DIAGNOSIS — M25569 Pain in unspecified knee: Secondary | ICD-10-CM | POA: Diagnosis not present

## 2012-06-06 DIAGNOSIS — Y9289 Other specified places as the place of occurrence of the external cause: Secondary | ICD-10-CM | POA: Insufficient documentation

## 2012-06-06 DIAGNOSIS — Y9301 Activity, walking, marching and hiking: Secondary | ICD-10-CM | POA: Insufficient documentation

## 2012-06-06 DIAGNOSIS — W010XXA Fall on same level from slipping, tripping and stumbling without subsequent striking against object, initial encounter: Secondary | ICD-10-CM | POA: Insufficient documentation

## 2012-06-06 IMAGING — CR DG KNEE COMPLETE 4+V*R*
5 series · 5 of 5 positions shown · non-contrast
Comparison: Visualized knee joint on the right femur x-ray
[DATE].

CLINICAL DATA: Fell and injured right knee.

RIGHT KNEE - COMPLETE 4+ VIEW

[t knee ap right]
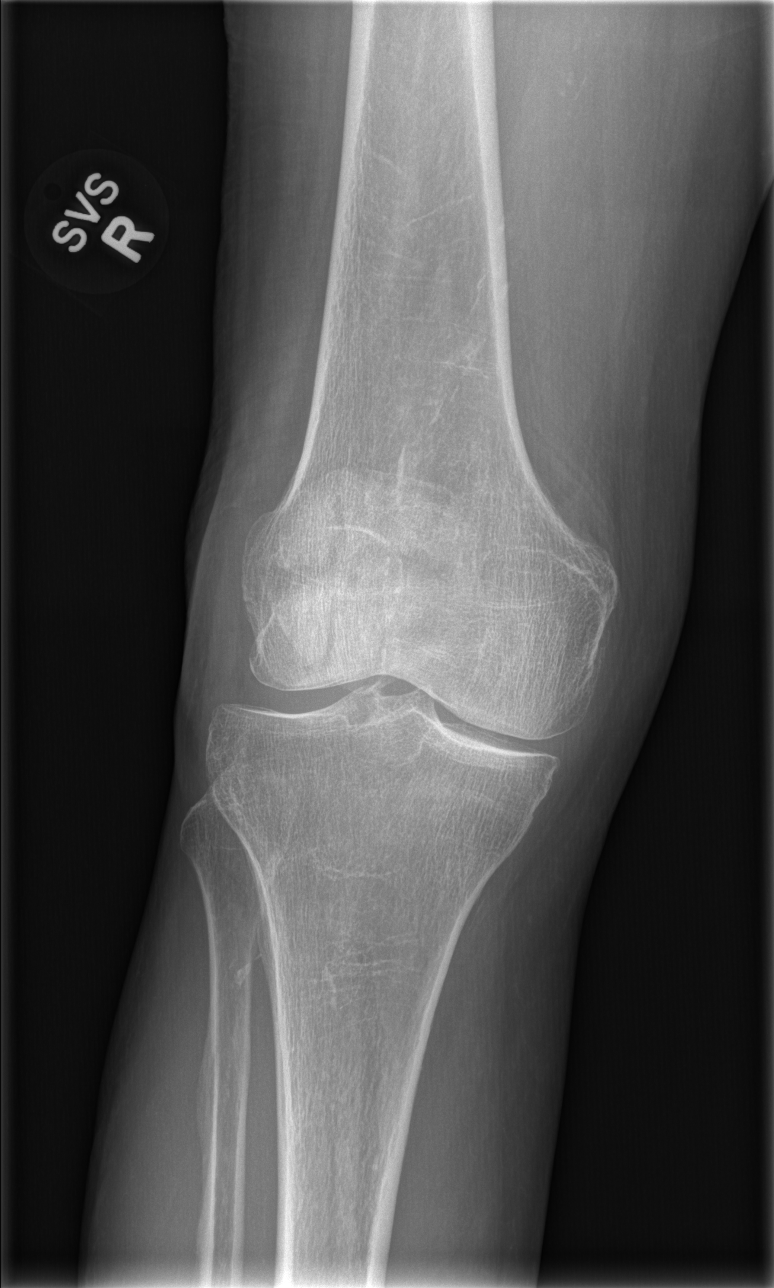

[t knee obl right (1 of 2)]
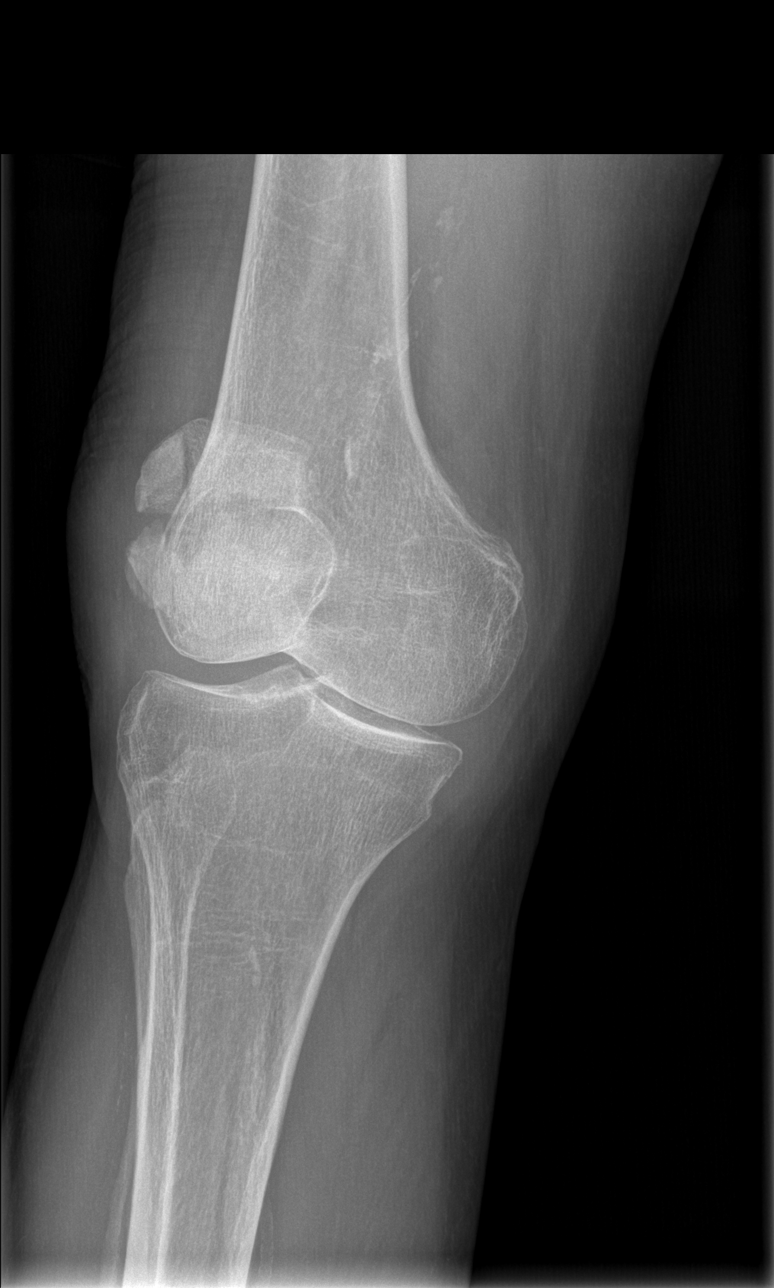

[t knee obl right (2 of 2)]
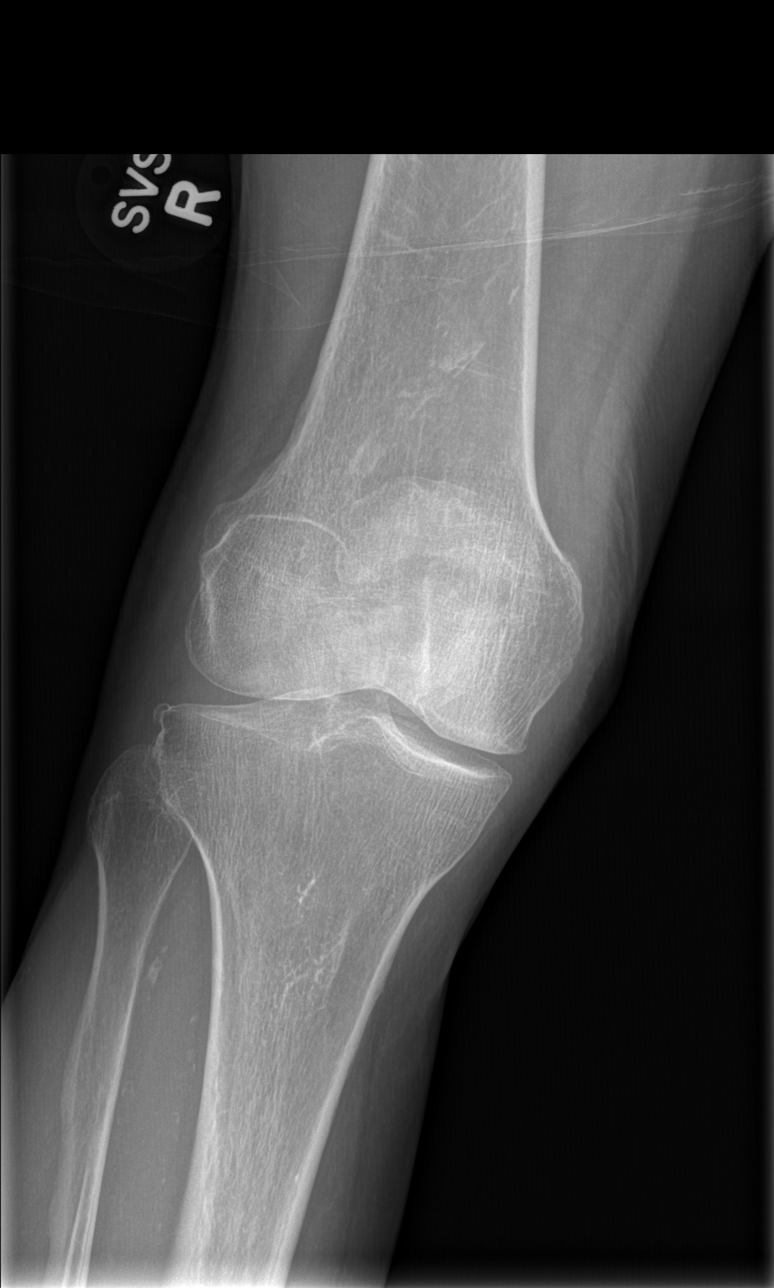

[x knee obl right]
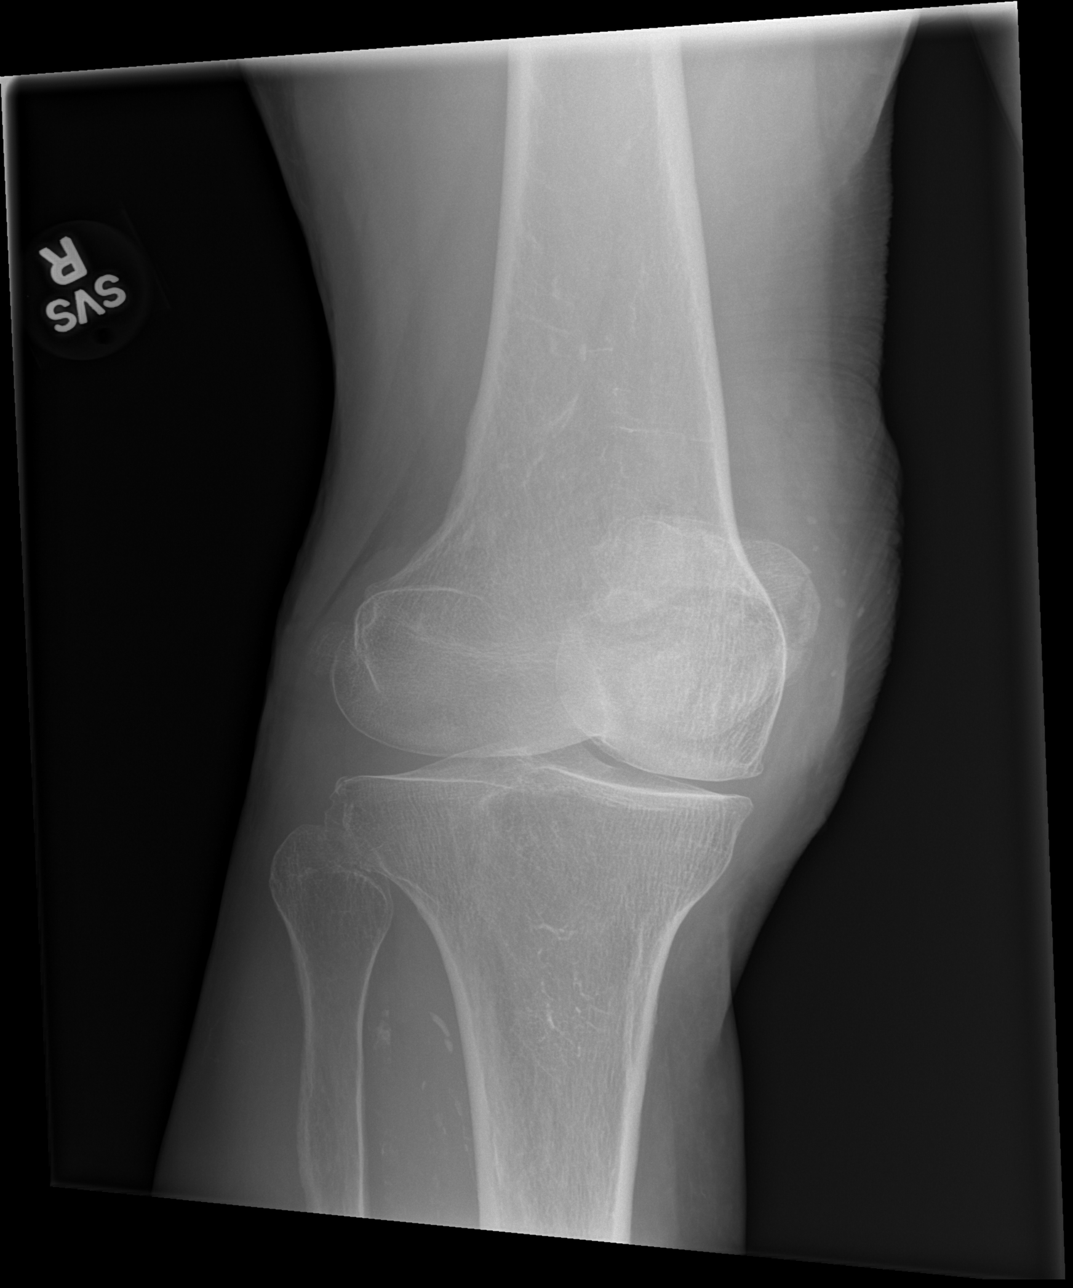

[x knee lat right]
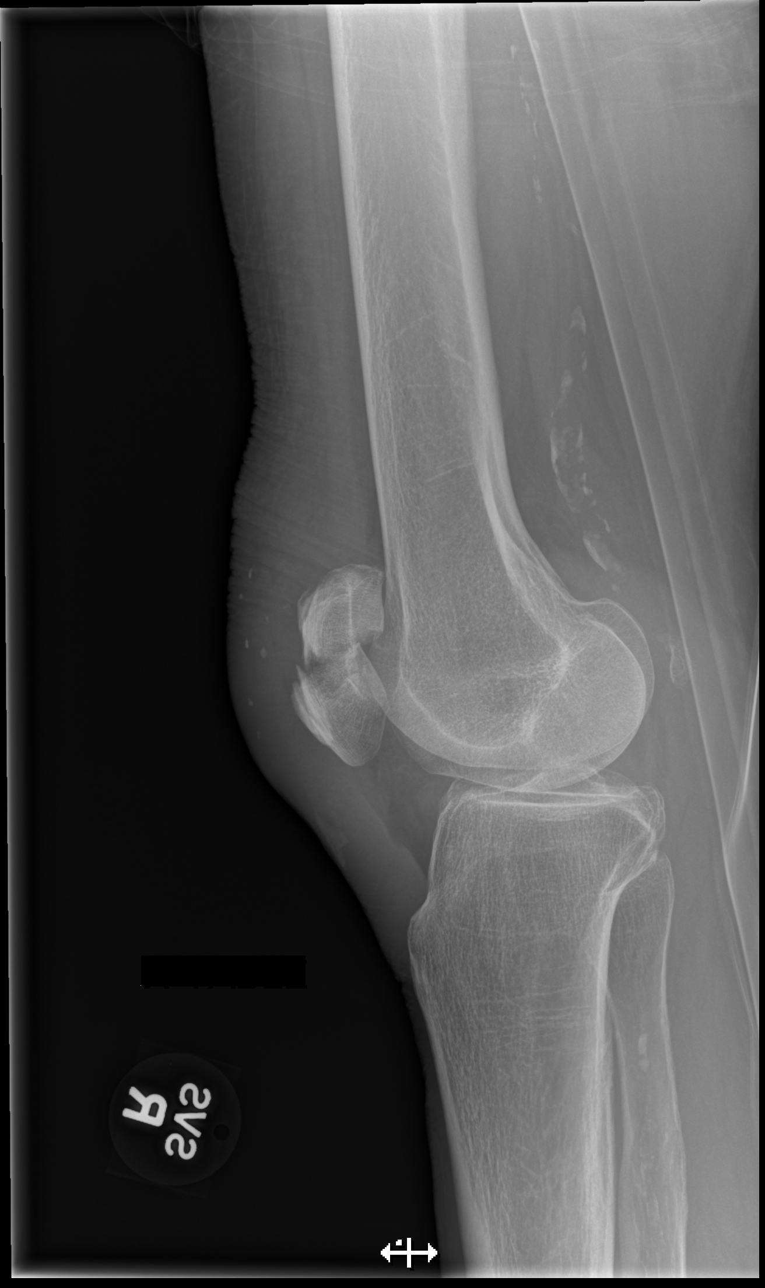

[5 of 5 positions shown; findings below may reference images not displayed]

FINDINGS: Horizontal fracture through the mid pole of the patella,
with an apparent vertical component as well.  No other fractures.
Marked anterior soft tissue swelling.  Mild medial compartment
joint space narrowing and hypertrophic spurring.  Lateral
patellofemoral compartments well-preserved. Femoropopliteal and
tibioperoneal atherosclerosis.
IMPRESSION: Fracture through the mid pole of the patella.

## 2012-06-06 IMAGING — CR DG SHOULDER 2+V*L*
3 series · 3 of 3 positions shown · non-contrast
Comparison: None.

CLINICAL DATA: Fall.  Injured left shoulder.

LEFT SHOULDER - 2+ VIEW

[t shoulder internal left]
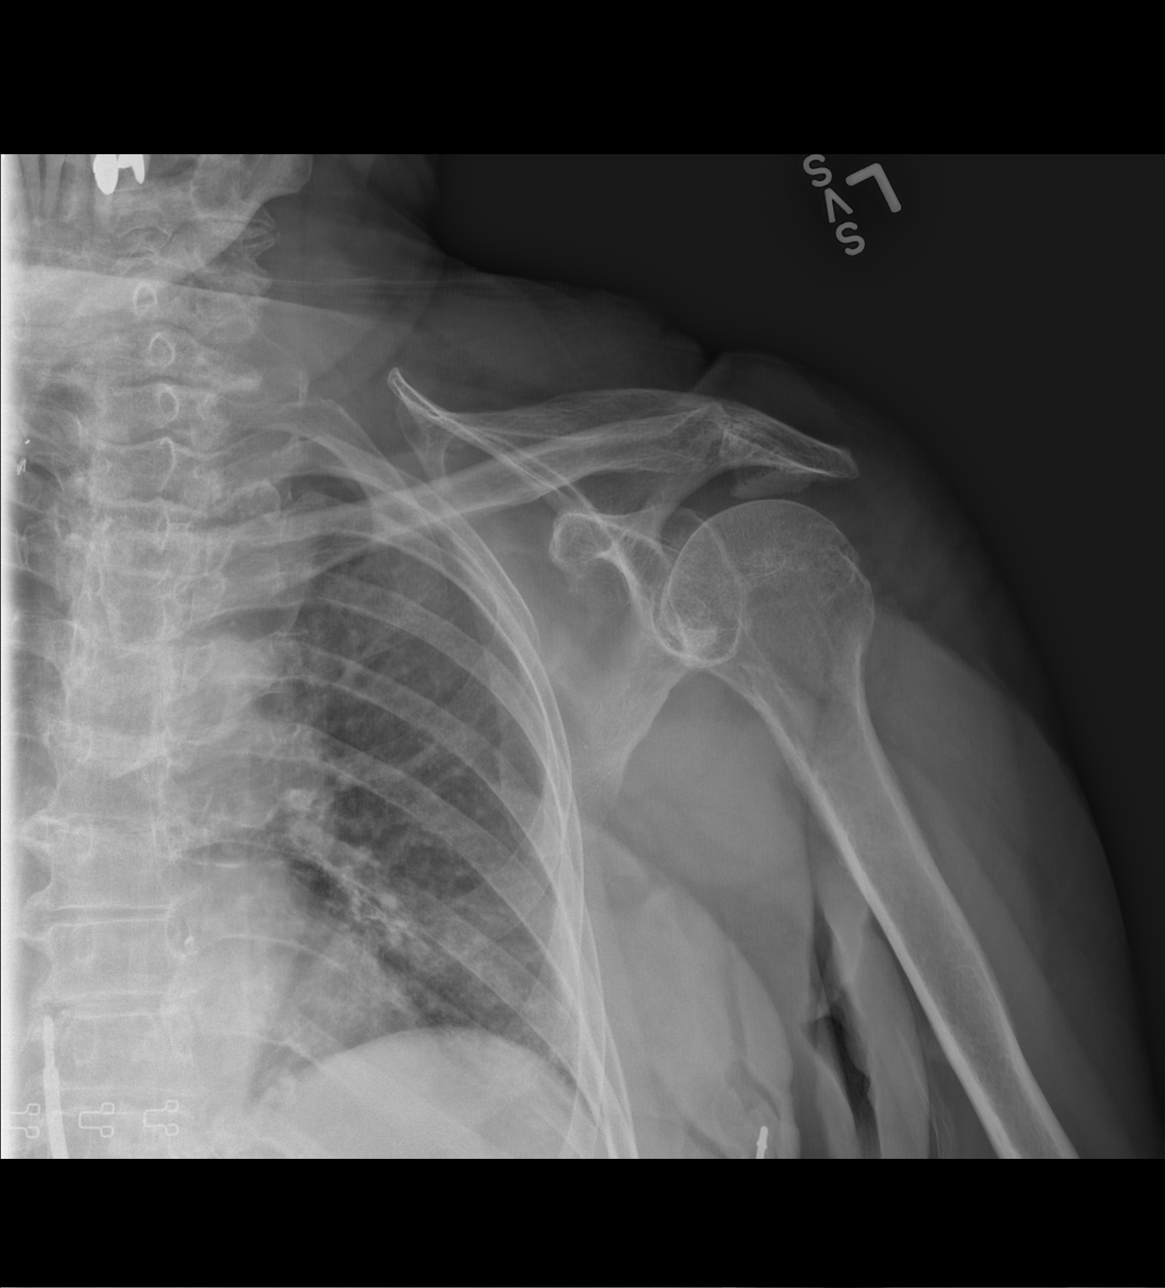

[t shoulder external left]
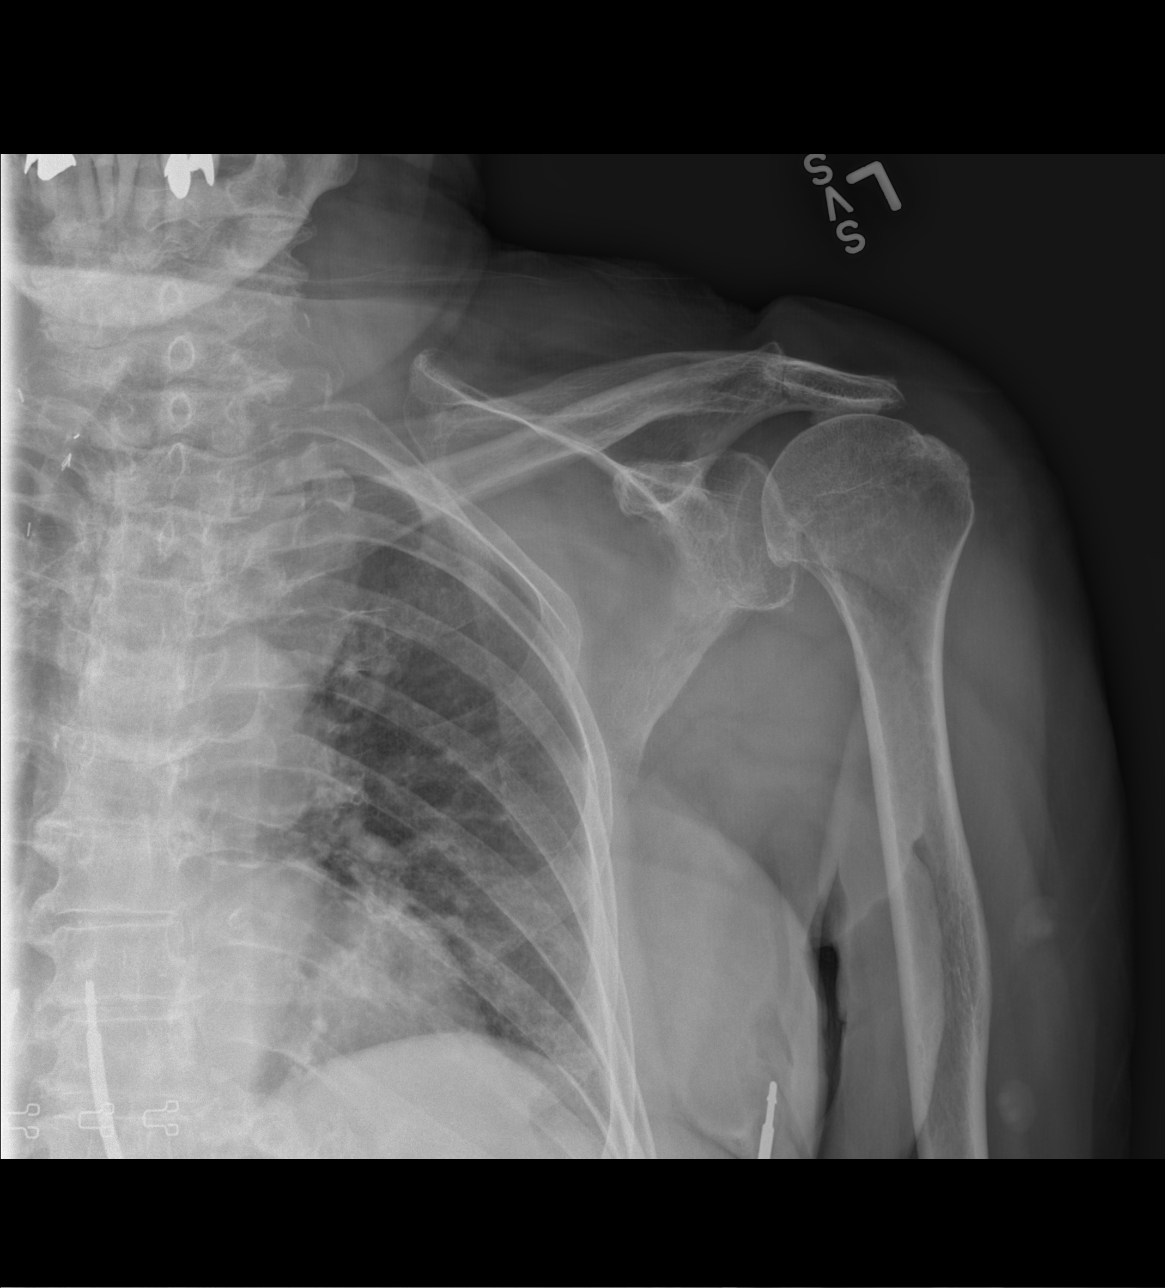

[t shoulder y-view left]
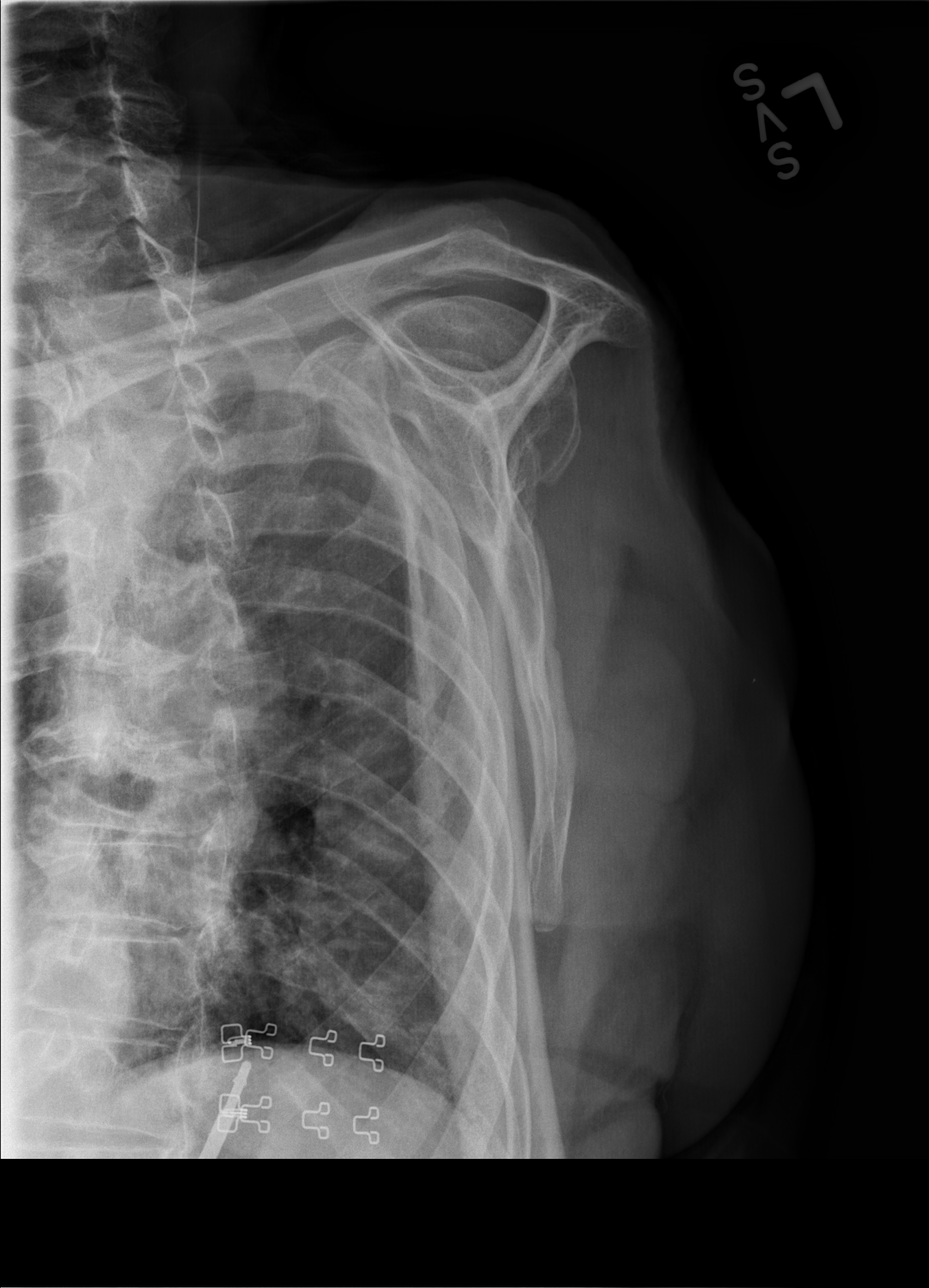

[3 of 3 positions shown; findings below may reference images not displayed]

FINDINGS: No evidence of acute fracture or dislocation.  Narrowed
subacromial space with large subacromial spur.  Degenerative
changes involving the acromioclavicular joint.  Osteopenia.
IMPRESSION: No acute osseous abnormality.  Degenerative changes in the AC
joint.  Narrowed subacromial space with subacromial spur which is
consistent with chronic supraspinatus tendon disease/impingement.

## 2012-06-06 MED ORDER — HYDROCODONE-ACETAMINOPHEN 5-325 MG PO TABS: 1.0000 | ORAL_TABLET | ORAL | Status: DC | PRN

## 2012-06-06 MED ORDER — TETANUS-DIPHTH-ACELL PERTUSSIS 5-2.5-18.5 LF-MCG/0.5 IM SUSP
INTRAMUSCULAR | Status: AC
  Administered 2012-06-06: 0.5 mL via INTRAMUSCULAR
  Filled 2012-06-06: qty 0.5

## 2012-06-06 NOTE — ED Notes (Signed)
Pt called family after falling at friend's home from standing position, landing on knees sustaining bilateral skin tears. Family insisted pt come to ED to be checked. No loss of consciousness, neuro's intact, PERRLA, no neck or back pain. Pt does have some left shoulder pain. Pt did Rx w/ ice at home PTA.

## 2012-06-06 NOTE — ED Notes (Signed)
Pt presents with no acute distress. Tripped landing on rt knee- No LOC no neck or back pain- Pt only c/o of rt knee pain-  Abrasion to rt knee bleeding controlled dressing with scant amount old blood

## 2012-06-06 NOTE — ED Provider Notes (Signed)
Medical screening examination/treatment/procedure(s) were conducted as a shared visit with non-physician practitioner(s) and myself.  I personally evaluated the patient during the encounter.  Patient evaluated. She is alert and relatively comfortable. Right knee is swollen, and tender anteriorly. She's able to extend the right knee against gravity. The patellar fracture is not open. There is an overlying abrasion.   Patient treated with wound dressing, Ace wrap, and right knee immobilizer. She is advised to use a walker to ambulate, and followup with her orthopedist, within 3 days.    Medical screening examination/treatment/procedure(s) were conducted as a shared visit with non-physician practitioner(s) and myself.  I personally evaluated the patient during the encounter  Richarda Blade, MD 06/08/12 2115

## 2012-06-06 NOTE — ED Provider Notes (Signed)
History     CSN: [id number]  Arrival date & time 06/06/12  [id number]   First MD Initiated Contact with Patient 06/06/12 2045      Chief Complaint  Patient presents with  . Fall  . Abrasion  . Knee Pain    (Consider location/radiation/quality/duration/timing/severity/associated sxs/prior treatment) HPI Alexa Mcdonald 78 y.o. female   The chief complaint is:  Risk analyst Complaint  Patient presents with  . Fall  . Abrasion  . Knee Pain     The patient was walking around the back side of the car lost her footing and fell onto her knees.  She noticed immediate pain in the right knee and some popping with movement.  She is currently on Coumadin and had immediate bruising and swelling.  Denies weakness, dizziness, or loss of consciousness as cause of fall.  She was ambulatory with her walker at home. Denies symptoms of compartment syndrome in the right leg.  She has some left-sided shoulder pain.  This is chronic but feels worse after the fall.  Full range of motion of the left shoulder. Denies fevers, chills, myalgias, arthralgias. Denies DOE, SOB, chest tightness or pressure, radiation to left arm, jaw or back, or diaphoresis. Denies dysuria, flank pain, suprapubic pain, frequency, urgency, or hematuria. Denies headaches, light headedness, weakness, visual disturbances. Denies abdominal pain, nausea, vomiting, diarrhea or constipation.   Past Medical History  Diagnosis Date  . Mediastinal goiter     removed 1988  . CKD (chronic kidney disease)     stage III  . Osteopenia   . Osteoporosis   . Vitamin D deficiency   . Paroxysmal atrial fibrillation   . Hypertension   . LBBB (left bundle branch block)   . Aortic valve sclerosis   . Hx of orthostatic hypotension   . Colon, diverticulosis   . Tachycardia-bradycardia   . Diabetes mellitus     diet controlled    Past Surgical History  Procedure Date  . Cataract extraction   . Carpal tunnel release   . Mri 08/18/07    head  (diabetes heart study at [facility])  . Mediastinal removal of a goiter     No family history on file.  History  Substance Use Topics  . Smoking status: Never Smoker   . Smokeless tobacco: Not on file  . Alcohol Use: No    OB History    Grav Para Term Preterm Abortions TAB SAB Ect Mult Living                  Review of Systems  Constitutional: Negative.   HENT: Negative.   Eyes: Negative.   Respiratory: Negative.   Cardiovascular: Negative.   Gastrointestinal: Negative.   Genitourinary: Negative.   Musculoskeletal: Positive for joint swelling and gait problem.  Skin: Negative for color change, pallor and wound.  Neurological: Negative.   Hematological: Bruises/bleeds easily.  Psychiatric/Behavioral: Negative.   All other systems reviewed and are negative.    Allergies  Amoxicillin and Celebrex  Home Medications   Current Outpatient Rx  Name  Route  Sig  Dispense  Refill  . AMIODARONE HCL 100 MG PO TABS   Oral   Take 150 mg by mouth daily.          [name] CALCIUM CARBONATE 600 MG PO TABS   Oral   Take 600 mg by mouth 2 (two) times daily with a meal.         . VITAMIN D 1000 UNITS PO TABS  Oral   Take 1,000 Units by mouth daily.         [name] CLINDAMYCIN HCL 300 MG PO CAPS   Oral   Take 300 mg by mouth 3 (three) times daily.          [name] LISINOPRIL 20 MG PO TABS   Oral   Take 10 mg by mouth daily.         [name] METHIMAZOLE 10 MG PO TABS   Oral   Take 5 mg by mouth daily.          [name] PRAVASTATIN SODIUM 10 MG PO TABS   Oral   Take 10 mg by mouth daily.         [name] RISEDRONATE SODIUM 35 MG PO TABS   Oral   Take 35 mg by mouth every 7 (seven) days. with water on empty stomach, nothing by mouth or lie down for next 30 minutes.         [name] VITAMIN E 400 UNITS PO CAPS   Oral   Take 800 Units by mouth daily.         . WARFARIN SODIUM 2.5 MG PO TABS   Oral   Take 2.5-3.75 mg by mouth daily. Take 1 1/2 tablets 4 days a week;(Sunday, Tuesday, Thursday,  Saturday); then 1 tablet on 3 days week.( Monday, Wednesday, Friday)           BP 153/66  Pulse 61  Temp 98 F (36.7 C) (Oral)  Resp 18  Ht 5\' 1"  (1.549 m)  Wt 133 lb (60.328 kg)  BMI 25.13 kg/m2  SpO2 100%  Physical Exam  Constitutional: She is oriented to person, place, and time. She appears well-developed and well-nourished. No distress.  HENT:  Head: Normocephalic and atraumatic.  Eyes: Conjunctivae normal are normal. No scleral icterus.  Neck: Normal range of motion.  Cardiovascular: Normal rate, regular rhythm and normal heart sounds.  Exam reveals no gallop and no friction rub.   No murmur heard. Pulmonary/Chest: Effort normal and breath sounds normal. No respiratory distress.  Abdominal: Soft. Bowel sounds are normal. She exhibits no distension and no mass. There is no tenderness. There is no guarding.  Musculoskeletal:       Large developing hematoma over L Patella. Moderate soft tissue swelling of the right knee.  Sig. Deep ecchymosis visible.  Able to extend the knee and flex at the hip. DIstal pulses intact.  Neuro intact.   Neurological: She is alert and oriented to person, place, and time.  Skin: Skin is warm and dry. She is not diaphoretic.    ED Course  Procedures (including critical care time)  Labs Reviewed - No data to display Dg Shoulder Left  06/06/2012  *RADIOLOGY REPORT*  Clinical Data: Fall.  Injured left shoulder.  LEFT SHOULDER - 2+ VIEW  Comparison: None.  Findings: No evidence of acute fracture or dislocation.  Narrowed subacromial space with large subacromial spur.  Degenerative changes involving the acromioclavicular joint.  Osteopenia.  IMPRESSION: No acute osseous abnormality.  Degenerative changes in the [facility] joint.  Narrowed subacromial space with subacromial spur which is consistent with chronic supraspinatus tendon disease/impingement.   Original Report Authenticated By: Evangeline Dakin, M.D.    Dg Knee Complete 4 Views Right  06/06/2012   *RADIOLOGY REPORT*  Clinical Data: [place] and injured right knee.  RIGHT KNEE - COMPLETE 4+ VIEW  Comparison: Visualized knee joint on the right femur x-ray 09/04/2011.  Findings: Horizontal fracture through the mid pole  of the patella, with an apparent vertical component as well.  No other fractures. Marked anterior soft tissue swelling.  Mild medial compartment joint space narrowing and hypertrophic spurring.  Lateral patellofemoral compartments well-preserved. Femoropopliteal and tibioperoneal atherosclerosis.  IMPRESSION: Fracture through the mid pole of the patella.   Original Report Authenticated By: Evangeline Dakin, M.D.      1. Patellar fracture       MDM  Patient seen in shared visit with Dr. Eulis Foster. Patient with right patellar frax. Knee immobilzer and walker.  Weight bearing only with immobilazer and assistive aids. Walker at home.  Patient will f/u with her orthopod by  Within the next 2 days.        Margarita Mail, PA-C 06/08/12 [phone]

## 2012-06-07 DIAGNOSIS — S82009A Unspecified fracture of unspecified patella, initial encounter for closed fracture: Secondary | ICD-10-CM | POA: Diagnosis not present

## 2012-06-14 ENCOUNTER — Other Ambulatory Visit: Payer: Self-pay | Admitting: Pain Medicine

## 2012-06-15 ENCOUNTER — Encounter (HOSPITAL_COMMUNITY): Payer: Self-pay | Admitting: Pharmacy Technician

## 2012-06-16 NOTE — Progress Notes (Signed)
EKG 06-02-2012 [place] and CXR In Epic

## 2012-06-17 ENCOUNTER — Inpatient Hospital Stay (HOSPITAL_COMMUNITY): Admission: RE | Admit: 2012-06-17 | Discharge: 2012-06-17 | Payer: Medicare Other | Source: Ambulatory Visit

## 2012-06-17 DIAGNOSIS — I10 Essential (primary) hypertension: Secondary | ICD-10-CM | POA: Diagnosis not present

## 2012-06-17 DIAGNOSIS — I359 Nonrheumatic aortic valve disorder, unspecified: Secondary | ICD-10-CM | POA: Diagnosis not present

## 2012-06-17 DIAGNOSIS — Z01818 Encounter for other preprocedural examination: Secondary | ICD-10-CM | POA: Diagnosis not present

## 2012-06-17 DIAGNOSIS — Z7901 Long term (current) use of anticoagulants: Secondary | ICD-10-CM | POA: Diagnosis not present

## 2012-06-17 DIAGNOSIS — S82009A Unspecified fracture of unspecified patella, initial encounter for closed fracture: Secondary | ICD-10-CM | POA: Diagnosis not present

## 2012-06-17 DIAGNOSIS — I4891 Unspecified atrial fibrillation: Secondary | ICD-10-CM | POA: Diagnosis not present

## 2012-06-21 NOTE — H&P (Signed)
NAMESHAKYRA, LLORET NO.:  [id number]  MEDICAL RECORD NO.:  [id number]  LOCATION:  PERIO                        FACILITY:  [facility]  PHYSICIAN:  Cynda Familia, M.D.DATE OF BIRTH:  [birth year]  DATE OF ADMISSION:  06/08/2012 DATE OF DISCHARGE:                             HISTORY & PHYSICAL   ADMISSION DIAGNOSIS:  Right knee comminuted fracture of her patella with a superimposed skin abrasion.  HISTORY OF PRESENT ILLNESS:  The patient is an 78 year old female, who sustained an injury around June 06, 2012, that was related through a fall.  She landed on her right knee.  She was taken to the emergency room and x-rays revealed a comminuted fracture of her right patella and she had a large abrasion across the anterior aspect of her knee with bruising.  She was placed in a long-leg immobilizer and she was referred onto the Orthopedic Office for further evaluation.  Upon evaluation, the patient was found to have a large superficial abrasion with complete skin loss across the anterior aspect of her knee and essentially over the patella, it was not a deep abrasion from superficial skin loss.  Her x-rays revealed a comminuted fracture of her patella and the patient was recommended for surgical intervention.  Surgical intervention was discussed with the patient, was either a repair of the comminuted fracture versus possible patellectomy.  The patient was going to have to hold off on the surgery for couple of weeks while the abrasion on the anterior aspect of her knee healed over due to the location of the fracture and abrasion to prevention possible increased risk of postsurgical infection.  The patient was placed on an antibiotic and kept in a knee immobilizer with daily wound checks.  The patient returned 2 weeks later, found still have the wound was healing up, but she still had a large area in the central area of just granulation tissue.  Her treatment was  changed over to Silvadene twice a day and we are going to watch it for another week prior to admission for probable patellectomy at this point.  The patient lives alone and will need skilled nursing facility postop.  The patient will be admitted once the wound is clean and healed and had a decreased risk of postoperative infection.  ALLERGIES:  CELEBREX and AMOXICILLIN.  PRIMARY CARE PHYSICIAN:  [name], MD  CURRENT MEDICATIONS: 1. Lisinopril 10 mg a day. 2. Amiodarone 3/4 of a tablet a day. 3. Pravastatin 10 mg a day. 4. Methimazole 1/2 tablet, which is 2.5 mg a day. 5. Vitamin D 1000 mg daily. 6. Coumadin 2.5 mg tablets 1-1/2 tablets for 3 days, then 1 tablet for     4 days. 7. Actonel 35 mg a week.  PAST MEDICAL HISTORY: 1. Chronic Coumadin therapy due to atrial fibrillation. 2. Hypertension. 3. Cardiac murmur. 4. Hearing impaired. 5. Tinnitus. 6. Thyroid disease. 7. Diet-controlled diabetes.  REVIEW OF SYSTEMS:  NEUROLOGIC:  The patient denies any strokes, seizures, convulsions in the past.  No unusual numbness, tingling, or memory loss.  No problems with alcohol or drugs.  PULMONARY:  She denies any shortness of breath, wheezing, productive cough.  No previous diagnosis  or problems with asthma, frequent pneumonia, COPD, sleep apnea, or tuberculosis.  CARDIOVASCULAR:  She states she does not have a rapid irregular heart rate.  It is a slow irregular.  She denies any problems in the past with heart attack.  She has had the cardiac murmur since child.  No chest pressure.  No chest pain.  No shortness of breath.  No difficulty sleeping at night in the lying down position due to breathing.  GI:  Denied problems on unusual constipation, diarrhea, heartburn, and indigestion.  No previous ulcers.  No problems with jaundice, hepatitis, or diverticular problems.  GU:  She denies any problems with urinating, holding her urine, frequent urination with burning, kidney  stones in the past.  ENDOCRINE:  She has well-controlled thyroid issues.  No unusual heat intolerances.  No unusual thirst or excessive urination.  PAST SURGICAL HISTORY:  Includes a C-section, goiter removal, carpal tunnel, cataracts, and squamous cell skin cancers without any problems with anesthesia.  FAMILY MEDICAL HISTORY:  Father is deceased at the age of 85 from complications of surgery.  Mother is deceased at the age of 35.  SOCIAL HISTORY:  The patient is widowed, has never smoked.  No alcohol or drugs.  She has three grown sons.  She lives alone.  She would like to go to a [employer], she was discussed with [employer].  She does have a power of attorney.  PHYSICAL EXAMINATION:  VITAL SIGNS:  The patient is about 130 pounds. She is 60 inches tall, BMI 25.7.  Blood pressure was 128/58, pulse of 70 and just slightly irregular.  She is afebrile. GENERAL:  This is an age-appropriate female, comfortable, appears to be in no distress, sitting in a wheelchair. NECK:  Supple.  No palpable lymphadenopathy. LUNGS:  Clear throughout.  No wheezes. HEART:  Slightly irregular, slow about 70.  I could not hear any murmurs. ABDOMEN:  Soft, nontender.  Bowel sounds present.  Good range of motion of her upper extremities in her shoulders, elbows, and wrists.  Her left lower extremity, she had good motion of the hip, knee, and ankle without any difficulty.  Her right leg, her knee was in long-leg immobilizer. She had significant bruising around the anterior aspect of her knee with about a 2-cm diameter of open granulation tissue.  No signs of infection.  She had no palpable effusion.  Calves on both sides were soft and nontender.  No signs of phlebitis.  Good pulses in the ankle.  IMPRESSION: 1. Comminuted fracture of her right patella with superimposed slow-     healing skin abrasion. 2. Atrial fibrillation, on chronic Coumadin therapy. 3. Hypertension. 4. History of cardiac murmur  since childhood. 5. Hearing impairment. 6. Tinnitus. 7. Partial upper dentures. 8. Thyroid disease. 9. Diet-controlled diabetes.  PLAN:  The patient will undergo all routine preoperative labs and tests. The patient has been cleared by her primary care physician to be off her Coumadin 5 days prior to her surgical date without any bridging treatment.  The patient is going to be seen by her cardiologist prior to hospitalization, and will readjust any recommendations at that time. The patient is comfortable with plan.  She had no other questions.     Evert Kohl, P.A.   ______________________________ Cynda Familia, M.D.    RWK/MEDQ  D:  06/18/2012  T:  06/18/2012  Job:  [id number]

## 2012-06-22 ENCOUNTER — Encounter (HOSPITAL_COMMUNITY): Admission: RE | Payer: Self-pay | Source: Ambulatory Visit

## 2012-06-22 ENCOUNTER — Ambulatory Visit (HOSPITAL_COMMUNITY): Admission: RE | Admit: 2012-06-22 | Payer: Medicare Other | Source: Ambulatory Visit | Admitting: Specialist

## 2012-06-22 SURGERY — OPEN REDUCTION INTERNAL FIXATION (ORIF) PATELLA
Anesthesia: Spinal | Site: Knee | Laterality: Right

## 2012-06-25 DIAGNOSIS — S82009A Unspecified fracture of unspecified patella, initial encounter for closed fracture: Secondary | ICD-10-CM | POA: Diagnosis not present

## 2012-07-05 DIAGNOSIS — Z7901 Long term (current) use of anticoagulants: Secondary | ICD-10-CM | POA: Diagnosis not present

## 2012-07-05 DIAGNOSIS — I4891 Unspecified atrial fibrillation: Secondary | ICD-10-CM | POA: Diagnosis not present

## 2012-07-19 DIAGNOSIS — S82009A Unspecified fracture of unspecified patella, initial encounter for closed fracture: Secondary | ICD-10-CM | POA: Diagnosis not present

## 2012-07-26 DIAGNOSIS — Z7901 Long term (current) use of anticoagulants: Secondary | ICD-10-CM | POA: Diagnosis not present

## 2012-07-26 DIAGNOSIS — I4891 Unspecified atrial fibrillation: Secondary | ICD-10-CM | POA: Diagnosis not present

## 2012-08-09 DIAGNOSIS — I4891 Unspecified atrial fibrillation: Secondary | ICD-10-CM | POA: Diagnosis not present

## 2012-08-09 DIAGNOSIS — Z7901 Long term (current) use of anticoagulants: Secondary | ICD-10-CM | POA: Diagnosis not present

## 2012-08-09 DIAGNOSIS — S82009A Unspecified fracture of unspecified patella, initial encounter for closed fracture: Secondary | ICD-10-CM | POA: Diagnosis not present

## 2012-08-13 DIAGNOSIS — S82009A Unspecified fracture of unspecified patella, initial encounter for closed fracture: Secondary | ICD-10-CM | POA: Diagnosis not present

## 2012-08-16 DIAGNOSIS — S82009A Unspecified fracture of unspecified patella, initial encounter for closed fracture: Secondary | ICD-10-CM | POA: Diagnosis not present

## 2012-08-26 DIAGNOSIS — Z79899 Other long term (current) drug therapy: Secondary | ICD-10-CM | POA: Diagnosis not present

## 2012-08-26 DIAGNOSIS — I359 Nonrheumatic aortic valve disorder, unspecified: Secondary | ICD-10-CM | POA: Diagnosis not present

## 2012-08-26 DIAGNOSIS — I4891 Unspecified atrial fibrillation: Secondary | ICD-10-CM | POA: Diagnosis not present

## 2012-08-26 DIAGNOSIS — I10 Essential (primary) hypertension: Secondary | ICD-10-CM | POA: Diagnosis not present

## 2012-08-26 DIAGNOSIS — I447 Left bundle-branch block, unspecified: Secondary | ICD-10-CM | POA: Diagnosis not present

## 2012-08-26 DIAGNOSIS — Z7901 Long term (current) use of anticoagulants: Secondary | ICD-10-CM | POA: Diagnosis not present

## 2012-08-30 ENCOUNTER — Ambulatory Visit (INDEPENDENT_AMBULATORY_CARE_PROVIDER_SITE_OTHER): Payer: Medicare Other | Admitting: Internal Medicine

## 2012-08-30 VITALS — BP 152/65 | HR 63 | Ht 60.5 in | Wt 129.0 lb

## 2012-08-30 DIAGNOSIS — R001 Bradycardia, unspecified: Secondary | ICD-10-CM

## 2012-08-30 DIAGNOSIS — I498 Other specified cardiac arrhythmias: Secondary | ICD-10-CM

## 2012-08-30 DIAGNOSIS — I4891 Unspecified atrial fibrillation: Secondary | ICD-10-CM

## 2012-08-30 NOTE — Patient Instructions (Addendum)
Your physician recommends that you schedule a follow-up appointment as needed with Dr Rayann Heman and keep regular appointments with Dr [name]

## 2012-08-30 NOTE — Progress Notes (Signed)
PCP: Alexa Coma, MD Primary Cardiologist:  Dr Arville Go is a 79 y.o. female who presents today for routine electrophysiology followup.  Since last being seen in our clinic, the patient reports doing very well.  She is ambulatory with a walker.  Today, she denies symptoms of palpitations, chest pain, shortness of breath,  lower extremity edema, dizziness, presyncope, or syncope.  The patient is otherwise without complaint today.   Past Medical History  Diagnosis Date  . Mediastinal goiter     removed 1988  . CKD (chronic kidney disease)     stage III  . Osteopenia   . Osteoporosis   . Vitamin D deficiency   . Paroxysmal atrial fibrillation   . Hypertension   . LBBB (left bundle branch block)   . Aortic valve sclerosis   . Hx of orthostatic hypotension   . Colon, diverticulosis   . Tachycardia-bradycardia   . Diabetes mellitus     diet controlled   Past Surgical History  Procedure Date  . Cataract extraction   . Carpal tunnel release   . Mri 08/18/07    head (diabetes heart study at [facility])  . Mediastinal removal of a goiter     Current Outpatient Prescriptions  Medication Sig Dispense Refill  . amiodarone (PACERONE) 100 MG tablet Take 150 mg by mouth daily.       . calcium carbonate (OS-CAL) 600 MG TABS Take 600 mg by mouth 2 (two) times daily with a meal.      . cholecalciferol (VITAMIN D) 1000 UNITS tablet Take 1,000 Units by mouth daily.      [name] lisinopril (PRINIVIL,ZESTRIL) 20 MG tablet Take 10 mg by mouth every morning.       . methimazole (TAPAZOLE) 10 MG tablet Take 5 mg by mouth daily.       . pravastatin (PRAVACHOL) 10 MG tablet Take 10 mg by mouth daily.      . risedronate (ACTONEL) 35 MG tablet Take 35 mg by mouth every Sunday. with water on empty stomach, nothing by mouth or lie down for next 30 minutes.      . vitamin E 400 UNIT capsule Take 800 Units by mouth daily.      [name] warfarin (COUMADIN) 2.5 MG tablet Take 2.5-3.75 mg by mouth daily. Take 1  1/2 tablets 4 days a week;(Sunday, Tuesday, Thursday, Saturday); then 1 tablet on 3 days week.( Monday, Wednesday, Friday)        Physical Exam: Filed Vitals:   08/30/12 1208  BP: 152/65  Pulse: 63  Height: 5' 0.5" (1.537 m)  Weight: 129 lb (58.514 kg)    GEN- The patient is well appearing, alert and oriented x 3 today.   Head- normocephalic, atraumatic Eyes-  Sclera clear, conjunctiva pink Ears- hearing intact Oropharynx- clear Lungs- Clear to ausculation bilaterally, normal work of breathing Heart- Regular rate and rhythm, 2/6 SEM LUSB (early peaking) GI- soft, NT, ND, + BS Extremities- no clubbing, cyanosis, or edema  ekg today reveals sinus arrhythmia 53 bpm, PR 200 msec, LBBB  Assessment and Plan:    Bradycardia  She has stable bradycardia and without symptoms. She has a first degree AV block and LBBB which also suggests significant conduction disease. She continues to want to avoid PPM implantation at this time. We will therefore continue her current medicine regimen.   I will see as needed going forward  Atrial fibrillation   Continue Coumadin  Decrease amiodarone to 200mg  daily  HTN BP  is elevated today She will contact Dr [name] for medicine adjustment  Return as needed Follow-up closely with Dr [name]

## 2012-09-06 DIAGNOSIS — I4891 Unspecified atrial fibrillation: Secondary | ICD-10-CM | POA: Diagnosis not present

## 2012-09-06 DIAGNOSIS — M810 Age-related osteoporosis without current pathological fracture: Secondary | ICD-10-CM | POA: Diagnosis not present

## 2012-09-06 DIAGNOSIS — Z0000 Encounter for general adult medical examination without abnormal findings: Secondary | ICD-10-CM | POA: Diagnosis not present

## 2012-09-06 DIAGNOSIS — E1129 Type 2 diabetes mellitus with other diabetic kidney complication: Secondary | ICD-10-CM | POA: Diagnosis not present

## 2012-09-06 DIAGNOSIS — I10 Essential (primary) hypertension: Secondary | ICD-10-CM | POA: Diagnosis not present

## 2012-09-06 DIAGNOSIS — Z7901 Long term (current) use of anticoagulants: Secondary | ICD-10-CM | POA: Diagnosis not present

## 2012-09-06 DIAGNOSIS — N183 Chronic kidney disease, stage 3 unspecified: Secondary | ICD-10-CM | POA: Diagnosis not present

## 2012-09-13 DIAGNOSIS — S82009A Unspecified fracture of unspecified patella, initial encounter for closed fracture: Secondary | ICD-10-CM | POA: Diagnosis not present

## 2012-09-14 DIAGNOSIS — I4891 Unspecified atrial fibrillation: Secondary | ICD-10-CM | POA: Diagnosis not present

## 2012-09-14 DIAGNOSIS — Z7901 Long term (current) use of anticoagulants: Secondary | ICD-10-CM | POA: Diagnosis not present

## 2012-09-14 DIAGNOSIS — I10 Essential (primary) hypertension: Secondary | ICD-10-CM | POA: Diagnosis not present

## 2012-09-14 DIAGNOSIS — I447 Left bundle-branch block, unspecified: Secondary | ICD-10-CM | POA: Diagnosis not present

## 2012-09-14 DIAGNOSIS — I359 Nonrheumatic aortic valve disorder, unspecified: Secondary | ICD-10-CM | POA: Diagnosis not present

## 2012-10-06 DIAGNOSIS — Z7901 Long term (current) use of anticoagulants: Secondary | ICD-10-CM | POA: Diagnosis not present

## 2012-10-06 DIAGNOSIS — I4891 Unspecified atrial fibrillation: Secondary | ICD-10-CM | POA: Diagnosis not present

## 2012-10-25 DIAGNOSIS — S82009A Unspecified fracture of unspecified patella, initial encounter for closed fracture: Secondary | ICD-10-CM | POA: Diagnosis not present

## 2012-11-03 DIAGNOSIS — I4891 Unspecified atrial fibrillation: Secondary | ICD-10-CM | POA: Diagnosis not present

## 2012-11-03 DIAGNOSIS — Z7901 Long term (current) use of anticoagulants: Secondary | ICD-10-CM | POA: Diagnosis not present

## 2012-11-22 DIAGNOSIS — E0520 Thyrotoxicosis with toxic multinodular goiter without thyrotoxic crisis or storm: Secondary | ICD-10-CM | POA: Diagnosis not present

## 2012-11-24 DIAGNOSIS — Z85828 Personal history of other malignant neoplasm of skin: Secondary | ICD-10-CM | POA: Diagnosis not present

## 2012-11-24 DIAGNOSIS — D239 Other benign neoplasm of skin, unspecified: Secondary | ICD-10-CM | POA: Diagnosis not present

## 2012-11-24 DIAGNOSIS — L570 Actinic keratosis: Secondary | ICD-10-CM | POA: Diagnosis not present

## 2012-11-24 DIAGNOSIS — L821 Other seborrheic keratosis: Secondary | ICD-10-CM | POA: Diagnosis not present

## 2012-12-07 DIAGNOSIS — I4891 Unspecified atrial fibrillation: Secondary | ICD-10-CM | POA: Diagnosis not present

## 2012-12-07 DIAGNOSIS — Z7901 Long term (current) use of anticoagulants: Secondary | ICD-10-CM | POA: Diagnosis not present

## 2012-12-23 DIAGNOSIS — E119 Type 2 diabetes mellitus without complications: Secondary | ICD-10-CM | POA: Diagnosis not present

## 2013-01-05 DIAGNOSIS — Z7901 Long term (current) use of anticoagulants: Secondary | ICD-10-CM | POA: Diagnosis not present

## 2013-01-05 DIAGNOSIS — I4891 Unspecified atrial fibrillation: Secondary | ICD-10-CM | POA: Diagnosis not present

## 2013-01-06 DIAGNOSIS — H0019 Chalazion unspecified eye, unspecified eyelid: Secondary | ICD-10-CM | POA: Diagnosis not present

## 2013-01-12 DIAGNOSIS — H0019 Chalazion unspecified eye, unspecified eyelid: Secondary | ICD-10-CM | POA: Diagnosis not present

## 2013-02-09 DIAGNOSIS — Z7901 Long term (current) use of anticoagulants: Secondary | ICD-10-CM | POA: Diagnosis not present

## 2013-02-09 DIAGNOSIS — I4891 Unspecified atrial fibrillation: Secondary | ICD-10-CM | POA: Diagnosis not present

## 2013-02-24 ENCOUNTER — Other Ambulatory Visit (HOSPITAL_COMMUNITY): Payer: Self-pay | Admitting: Cardiology

## 2013-02-24 DIAGNOSIS — I471 Supraventricular tachycardia: Secondary | ICD-10-CM | POA: Diagnosis not present

## 2013-02-24 DIAGNOSIS — I10 Essential (primary) hypertension: Secondary | ICD-10-CM | POA: Diagnosis not present

## 2013-02-24 DIAGNOSIS — I447 Left bundle-branch block, unspecified: Secondary | ICD-10-CM | POA: Diagnosis not present

## 2013-02-24 DIAGNOSIS — Z79899 Other long term (current) drug therapy: Secondary | ICD-10-CM | POA: Diagnosis not present

## 2013-02-24 DIAGNOSIS — I4891 Unspecified atrial fibrillation: Secondary | ICD-10-CM

## 2013-02-24 DIAGNOSIS — I359 Nonrheumatic aortic valve disorder, unspecified: Secondary | ICD-10-CM | POA: Diagnosis not present

## 2013-02-24 DIAGNOSIS — I951 Orthostatic hypotension: Secondary | ICD-10-CM | POA: Diagnosis not present

## 2013-03-07 ENCOUNTER — Ambulatory Visit (HOSPITAL_COMMUNITY)
Admission: RE | Admit: 2013-03-07 | Discharge: 2013-03-07 | Disposition: A | Discharge status: Home / Self Care | Payer: Medicare Other | Source: Ambulatory Visit | Attending: Cardiology | Admitting: Cardiology

## 2013-03-07 DIAGNOSIS — Z79899 Other long term (current) drug therapy: Secondary | ICD-10-CM | POA: Diagnosis not present

## 2013-03-07 DIAGNOSIS — I4891 Unspecified atrial fibrillation: Secondary | ICD-10-CM | POA: Diagnosis not present

## 2013-03-11 ENCOUNTER — Other Ambulatory Visit (HOSPITAL_COMMUNITY): Payer: Self-pay | Admitting: Family Medicine

## 2013-03-11 DIAGNOSIS — Z1231 Encounter for screening mammogram for malignant neoplasm of breast: Secondary | ICD-10-CM

## 2013-03-14 DIAGNOSIS — N183 Chronic kidney disease, stage 3 unspecified: Secondary | ICD-10-CM | POA: Diagnosis not present

## 2013-03-14 DIAGNOSIS — I10 Essential (primary) hypertension: Secondary | ICD-10-CM | POA: Diagnosis not present

## 2013-03-14 DIAGNOSIS — E1129 Type 2 diabetes mellitus with other diabetic kidney complication: Secondary | ICD-10-CM | POA: Diagnosis not present

## 2013-03-14 DIAGNOSIS — E559 Vitamin D deficiency, unspecified: Secondary | ICD-10-CM | POA: Diagnosis not present

## 2013-03-14 DIAGNOSIS — Z7901 Long term (current) use of anticoagulants: Secondary | ICD-10-CM | POA: Diagnosis not present

## 2013-03-16 DIAGNOSIS — I4891 Unspecified atrial fibrillation: Secondary | ICD-10-CM | POA: Diagnosis not present

## 2013-03-16 DIAGNOSIS — Z7901 Long term (current) use of anticoagulants: Secondary | ICD-10-CM | POA: Diagnosis not present

## 2013-04-05 DIAGNOSIS — I4891 Unspecified atrial fibrillation: Secondary | ICD-10-CM | POA: Diagnosis not present

## 2013-04-07 ENCOUNTER — Ambulatory Visit (HOSPITAL_COMMUNITY)
Admission: RE | Admit: 2013-04-07 | Discharge: 2013-04-07 | Disposition: A | Discharge status: Home / Self Care | Payer: Medicare Other | Source: Ambulatory Visit | Attending: Family Medicine | Admitting: Family Medicine

## 2013-04-07 DIAGNOSIS — I359 Nonrheumatic aortic valve disorder, unspecified: Secondary | ICD-10-CM | POA: Diagnosis not present

## 2013-04-07 DIAGNOSIS — R609 Edema, unspecified: Secondary | ICD-10-CM | POA: Diagnosis not present

## 2013-04-07 DIAGNOSIS — Z1231 Encounter for screening mammogram for malignant neoplasm of breast: Secondary | ICD-10-CM | POA: Diagnosis not present

## 2013-04-07 DIAGNOSIS — I4891 Unspecified atrial fibrillation: Secondary | ICD-10-CM | POA: Diagnosis not present

## 2013-04-07 DIAGNOSIS — I10 Essential (primary) hypertension: Secondary | ICD-10-CM | POA: Diagnosis not present

## 2013-04-11 ENCOUNTER — Other Ambulatory Visit: Payer: Self-pay | Admitting: Family Medicine

## 2013-04-11 DIAGNOSIS — R928 Other abnormal and inconclusive findings on diagnostic imaging of breast: Secondary | ICD-10-CM

## 2013-04-19 ENCOUNTER — Ambulatory Visit
Admission: RE | Admit: 2013-04-19 | Discharge: 2013-04-19 | Disposition: A | Discharge status: Home / Self Care | Payer: Medicare Other | Source: Ambulatory Visit | Attending: Family Medicine | Admitting: Family Medicine

## 2013-04-19 DIAGNOSIS — R928 Other abnormal and inconclusive findings on diagnostic imaging of breast: Secondary | ICD-10-CM

## 2013-04-27 DIAGNOSIS — Z7901 Long term (current) use of anticoagulants: Secondary | ICD-10-CM | POA: Diagnosis not present

## 2013-04-27 DIAGNOSIS — I4891 Unspecified atrial fibrillation: Secondary | ICD-10-CM | POA: Diagnosis not present

## 2013-05-13 DIAGNOSIS — Z23 Encounter for immunization: Secondary | ICD-10-CM | POA: Diagnosis not present

## 2013-05-23 DIAGNOSIS — E0520 Thyrotoxicosis with toxic multinodular goiter without thyrotoxic crisis or storm: Secondary | ICD-10-CM | POA: Diagnosis not present

## 2013-05-26 DIAGNOSIS — L608 Other nail disorders: Secondary | ICD-10-CM | POA: Diagnosis not present

## 2013-05-26 DIAGNOSIS — Z85828 Personal history of other malignant neoplasm of skin: Secondary | ICD-10-CM | POA: Diagnosis not present

## 2013-05-26 DIAGNOSIS — L821 Other seborrheic keratosis: Secondary | ICD-10-CM | POA: Diagnosis not present

## 2013-05-26 DIAGNOSIS — L570 Actinic keratosis: Secondary | ICD-10-CM | POA: Diagnosis not present

## 2013-05-26 DIAGNOSIS — D1801 Hemangioma of skin and subcutaneous tissue: Secondary | ICD-10-CM | POA: Diagnosis not present

## 2013-06-08 ENCOUNTER — Ambulatory Visit (INDEPENDENT_AMBULATORY_CARE_PROVIDER_SITE_OTHER): Payer: Medicare Other | Admitting: Pharmacist

## 2013-06-08 VITALS — BP 160/80

## 2013-06-08 DIAGNOSIS — I4891 Unspecified atrial fibrillation: Secondary | ICD-10-CM | POA: Diagnosis not present

## 2013-06-08 LAB — POCT INR: INR: 2.4

## 2013-06-08 MED ORDER — LISINOPRIL 20 MG PO TABS: 20.0000 mg | ORAL_TABLET | Freq: Two times a day (BID) | ORAL | Status: DC

## 2013-06-14 ENCOUNTER — Telehealth: Payer: Self-pay | Admitting: Cardiology

## 2013-06-14 NOTE — Telephone Encounter (Signed)
New message    Pt was to call and give bp readings to Dr [name] bp readings:  thurs 9am before meds 153/72; 7:30pm 150/51; 9pm 175/59----fri 9am 171/59 before meds taken; 5pm 165-58; 9pm 180-62------sat 9am 176/60 before meds; 4pm 156/52; 9pm 177/59----Sun 9am 192/69 before meds; 9pm 156/58; 9pm 176/62-----mon 9am 174/63 before meds; 1:30 104/46; 9pm 167/53-------tues 9am 120/50 before meds

## 2013-06-15 NOTE — Telephone Encounter (Signed)
Increase Lisinopril to 30mg  daily and check BP daily for a week and call

## 2013-06-15 NOTE — Telephone Encounter (Signed)
To Dr. [name]. Alexa Mcdonald had advised pt to do 20 in the am and 20 in the PM and pt has been doing that.

## 2013-06-15 NOTE — Telephone Encounter (Signed)
Add amlodipine 2.5mg  daily in addition to her other meds. Have her check her BP daily for a week and call with results

## 2013-06-16 NOTE — Telephone Encounter (Signed)
Spoke with pt and she stated she had just started the 20 in the am and 20 in the pm in the past few days and her BP readings have been better. Dr. [name] advised pt track BP readings daily for a week and let us know the results before we make any med changes.

## 2013-06-16 NOTE — Telephone Encounter (Signed)
Pt is aware.  

## 2013-06-23 ENCOUNTER — Telehealth: Payer: Self-pay | Admitting: Cardiology

## 2013-06-23 NOTE — Telephone Encounter (Signed)
TO Dr [name] to advise.

## 2013-06-23 NOTE — Telephone Encounter (Signed)
Her HR is in the 40's with one weak spell.  She just saw Dr. Rayann Heman and they are trying to avoid PPM.  No change in meds for now

## 2013-06-23 NOTE — Telephone Encounter (Signed)
At this time I will continue her meds as she is doing since she had one reading that was low.  Please call if she has any further low BP readings with weakness

## 2013-06-23 NOTE — Telephone Encounter (Signed)
New Problem:  Pt states she has BP and heart rate readings for the past week... Most days have two readings. Pt also states that on 11/19 her BP dropped and she felt very weak. Please advise  11/13...  150/54 40... 11/14... 153/54 42...   166/60  43 11/15... 158/54 44...   160/57 42 11/16... 175/62 45...  156/55 44 11/17... 168/59 45...  160/55 44 11/18... 165/56 45...  157/54 47 11/19... 152/54 45...   95/55

## 2013-06-24 NOTE — Telephone Encounter (Signed)
Pt verbalized undestanding

## 2013-07-04 ENCOUNTER — Other Ambulatory Visit: Payer: Self-pay | Admitting: Cardiology

## 2013-07-20 ENCOUNTER — Ambulatory Visit (INDEPENDENT_AMBULATORY_CARE_PROVIDER_SITE_OTHER): Payer: Medicare Other | Admitting: *Deleted

## 2013-07-20 DIAGNOSIS — I4891 Unspecified atrial fibrillation: Secondary | ICD-10-CM

## 2013-07-20 LAB — POCT INR: INR: 2.1

## 2013-08-02 DIAGNOSIS — Z780 Asymptomatic menopausal state: Secondary | ICD-10-CM | POA: Diagnosis not present

## 2013-08-05 ENCOUNTER — Encounter: Payer: Self-pay | Admitting: General Surgery

## 2013-08-05 DIAGNOSIS — M25472 Effusion, left ankle: Secondary | ICD-10-CM

## 2013-08-05 DIAGNOSIS — R600 Localized edema: Secondary | ICD-10-CM

## 2013-08-05 DIAGNOSIS — M25471 Effusion, right ankle: Secondary | ICD-10-CM | POA: Insufficient documentation

## 2013-08-05 DIAGNOSIS — I10 Essential (primary) hypertension: Secondary | ICD-10-CM | POA: Insufficient documentation

## 2013-08-05 DIAGNOSIS — I350 Nonrheumatic aortic (valve) stenosis: Secondary | ICD-10-CM

## 2013-08-29 ENCOUNTER — Ambulatory Visit (INDEPENDENT_AMBULATORY_CARE_PROVIDER_SITE_OTHER): Payer: Medicare Other | Admitting: Cardiology

## 2013-08-29 ENCOUNTER — Ambulatory Visit (INDEPENDENT_AMBULATORY_CARE_PROVIDER_SITE_OTHER): Payer: Medicare Other | Admitting: Pharmacist

## 2013-08-29 ENCOUNTER — Encounter: Payer: Self-pay | Admitting: General Surgery

## 2013-08-29 ENCOUNTER — Encounter: Payer: Self-pay | Admitting: Cardiology

## 2013-08-29 VITALS — BP 142/76 | HR 52 | Ht 60.5 in | Wt 130.4 lb

## 2013-08-29 DIAGNOSIS — I4891 Unspecified atrial fibrillation: Secondary | ICD-10-CM

## 2013-08-29 DIAGNOSIS — R609 Edema, unspecified: Secondary | ICD-10-CM

## 2013-08-29 DIAGNOSIS — Z5181 Encounter for therapeutic drug level monitoring: Secondary | ICD-10-CM

## 2013-08-29 DIAGNOSIS — I498 Other specified cardiac arrhythmias: Secondary | ICD-10-CM

## 2013-08-29 DIAGNOSIS — R600 Localized edema: Secondary | ICD-10-CM

## 2013-08-29 DIAGNOSIS — I10 Essential (primary) hypertension: Secondary | ICD-10-CM

## 2013-08-29 DIAGNOSIS — I359 Nonrheumatic aortic valve disorder, unspecified: Secondary | ICD-10-CM

## 2013-08-29 DIAGNOSIS — I350 Nonrheumatic aortic (valve) stenosis: Secondary | ICD-10-CM

## 2013-08-29 DIAGNOSIS — R001 Bradycardia, unspecified: Secondary | ICD-10-CM

## 2013-08-29 LAB — TSH: TSH: 1.03 u[IU]/mL (ref 0.35–5.50)

## 2013-08-29 LAB — POCT INR: INR: 3.2

## 2013-08-29 NOTE — Patient Instructions (Signed)
Your physician recommends that you continue on your current medications as directed. Please refer to the Current Medication list given to you today.  Your physician recommends that you go to the lab today for a TSH  Your physician has requested that you have an echocardiogram. Echocardiography is a painless test that uses sound waves to create images of your heart. It provides your doctor with information about the size and shape of your heart and how well your heart's chambers and valves are working. This procedure takes approximately one hour. There are no restrictions for this procedure. (Schedule 6 Months from now)  Your physician wants you to follow-up in: 6 Months with Dr Mallie Snooks will receive a reminder letter in the mail two months in advance. If you don't receive a letter, please call our office to schedule the follow-up appointment.

## 2013-08-29 NOTE — Progress Notes (Signed)
Alexa Mcdonald, [place]  [address] Phone: [phone] Fax:  [phone]  Date:  08/29/2013   ID:  [name], DOB 06/07/1921, MRN [id number]  PCP:  [name], MD  Cardiologist:  Fransico Him, MD     History of Present Illness: Alexa Mcdonald is a 80 y.o. female with a history of severe AS medically managed, HTN, PAF on chronic anticoagulation and chronic LE edema.  She is doing well.  She denies any chest pain, SOB, DOE, dizziness, palpitations or syncope. She has chronic LE edema which is controlled.  She is still quite active and is able to do her daily activities and shop without any SOB or chest pain.   Wt Readings from Last 3 Encounters:  04/07/13 143 lb (64.864 kg)  08/30/12 129 lb (58.514 kg)  06/06/12 133 lb (60.328 kg)     Past Medical History  Diagnosis Date  . Mediastinal goiter     removed 1988  . CKD (chronic kidney disease)     stage III  . Osteopenia   . Osteoporosis   . Vitamin D deficiency   . Paroxysmal atrial fibrillation   . Hypertension   . LBBB (left bundle branch block)   . Aortic valve sclerosis   . Hx of orthostatic hypotension   . Colon, diverticulosis   . Tachycardia-bradycardia   . Diabetes mellitus     diet controlled  . Esophageal reflux   . Aortic stenosis, severe 04/2013  . GERD (gastroesophageal reflux disease)     Current Outpatient Prescriptions  Medication Sig Dispense Refill  . amiodarone (PACERONE) 100 MG tablet Take 150 mg by mouth daily.       . calcium carbonate (OS-CAL) 600 MG TABS Take 600 mg by mouth 2 (two) times daily with a meal.      . cholecalciferol (VITAMIN D) 1000 UNITS tablet Take 1,000 Units by mouth daily.      [name] lisinopril (PRINIVIL,ZESTRIL) 20 MG tablet Take 1 tablet (20 mg total) by mouth 2 (two) times daily.  60 tablet  5  . methimazole (TAPAZOLE) 10 MG tablet Take 5 mg by mouth daily.       . pravastatin (PRAVACHOL) 10 MG tablet Take 10 mg by mouth daily.      .  risedronate (ACTONEL) 35 MG tablet Take 35 mg by mouth every Sunday. with water on empty stomach, nothing by mouth or lie down for next 30 minutes.      . vitamin E 400 UNIT capsule Take 800 Units by mouth daily.      [name] warfarin (COUMADIN) 2.5 MG tablet take 1 tablet by mouth once daily EXCEPT 1AND1/2 TABLET ON THURSDAY  45 tablet  5   No current facility-administered medications for this visit.    Allergies:    Allergies  Allergen Reactions  . Amoxicillin     tongue felt bruised.  . Celebrex [Celecoxib]     Mouth sores    Social History:  The patient  reports that she has never smoked. She does not have any smokeless tobacco history on file. She reports that she does not drink alcohol or use illicit drugs.   Family History:  The patient's family history includes Arrhythmia in her brother; CAD in her brother; Cancer - Prostate in her father; Diabetes in her mother and sister; Hypertension in her brother and sister; Pulmonary embolism in her father; Pulmonary fibrosis in her brother.   ROS:  Please see the history  of present illness.      All other systems reviewed and negative.   PHYSICAL EXAM: VS:  Ht 1' (0.305 m) Well nourished, well developed, in no acute distress HEENT: normal Neck: no JVD Cardiac:  normal S1, S2; RRR; 2/6 late peaking systolic murmur at RUSB to LLSB and radiates into the carotid arteries Lungs:  clear to auscultation bilaterally, no wheezing, rhonchi or rales Abd: soft, nontender, no hepatomegaly Ext: no edema Skin: warm and dry Neuro:  CNs 2-12 intact, no focal abnormalities noted  EKG:     Sinus bradycardia at 52bpm with LBBB  ASSESSMENT AND PLAN:  1. Severe AS - she is completely asymptomatic  - repeat echo in 6 months 2. HTN- well controlled  - continue Lisinopril 3. Chronic LE edema - controlled on no diuretic 4. PAF- maintaining NSR on Amio  - continue amio and warfarin  - check TSH 5. Chronic systemic anticoagulation  Followup with me in 6  months  Signed, Fransico Him, MD 08/29/2013 11:01 AM

## 2013-09-23 ENCOUNTER — Encounter: Payer: Self-pay | Admitting: Cardiology

## 2013-09-23 ENCOUNTER — Ambulatory Visit (INDEPENDENT_AMBULATORY_CARE_PROVIDER_SITE_OTHER): Payer: Medicare Other | Admitting: Pharmacist

## 2013-09-23 DIAGNOSIS — Z5181 Encounter for therapeutic drug level monitoring: Secondary | ICD-10-CM

## 2013-09-23 DIAGNOSIS — I4891 Unspecified atrial fibrillation: Secondary | ICD-10-CM

## 2013-09-23 LAB — POCT INR: INR: 2.4

## 2013-10-21 ENCOUNTER — Ambulatory Visit (INDEPENDENT_AMBULATORY_CARE_PROVIDER_SITE_OTHER): Payer: Medicare Other | Admitting: Pharmacist

## 2013-10-21 DIAGNOSIS — I4891 Unspecified atrial fibrillation: Secondary | ICD-10-CM | POA: Diagnosis not present

## 2013-10-21 DIAGNOSIS — Z5181 Encounter for therapeutic drug level monitoring: Secondary | ICD-10-CM

## 2013-10-21 LAB — POCT INR: INR: 2

## 2013-11-18 ENCOUNTER — Ambulatory Visit (INDEPENDENT_AMBULATORY_CARE_PROVIDER_SITE_OTHER): Payer: Medicare Other | Admitting: *Deleted

## 2013-11-18 DIAGNOSIS — Z5181 Encounter for therapeutic drug level monitoring: Secondary | ICD-10-CM | POA: Diagnosis not present

## 2013-11-18 DIAGNOSIS — I4891 Unspecified atrial fibrillation: Secondary | ICD-10-CM

## 2013-11-18 LAB — POCT INR: INR: 1.9

## 2013-11-21 DIAGNOSIS — E0520 Thyrotoxicosis with toxic multinodular goiter without thyrotoxic crisis or storm: Secondary | ICD-10-CM | POA: Diagnosis not present

## 2013-11-22 ENCOUNTER — Other Ambulatory Visit: Payer: Self-pay | Admitting: Dermatology

## 2013-11-22 DIAGNOSIS — L723 Sebaceous cyst: Secondary | ICD-10-CM | POA: Diagnosis not present

## 2013-11-22 DIAGNOSIS — Z85828 Personal history of other malignant neoplasm of skin: Secondary | ICD-10-CM | POA: Diagnosis not present

## 2013-11-22 DIAGNOSIS — C44621 Squamous cell carcinoma of skin of unspecified upper limb, including shoulder: Secondary | ICD-10-CM | POA: Diagnosis not present

## 2013-11-22 DIAGNOSIS — D485 Neoplasm of uncertain behavior of skin: Secondary | ICD-10-CM | POA: Diagnosis not present

## 2013-12-06 ENCOUNTER — Other Ambulatory Visit: Payer: Self-pay | Admitting: Dermatology

## 2013-12-06 DIAGNOSIS — C44621 Squamous cell carcinoma of skin of unspecified upper limb, including shoulder: Secondary | ICD-10-CM | POA: Diagnosis not present

## 2013-12-06 DIAGNOSIS — L905 Scar conditions and fibrosis of skin: Secondary | ICD-10-CM | POA: Diagnosis not present

## 2013-12-12 ENCOUNTER — Emergency Department (HOSPITAL_COMMUNITY): Payer: Medicare Other

## 2013-12-12 ENCOUNTER — Inpatient Hospital Stay (HOSPITAL_COMMUNITY)
Admission: EM | Admit: 2013-12-12 | Discharge: 2013-12-15 | DRG: 194 | Disposition: A | Discharge status: Home / Self Care | Payer: Medicare Other | Attending: Internal Medicine | Admitting: Internal Medicine

## 2013-12-12 ENCOUNTER — Other Ambulatory Visit: Payer: Self-pay

## 2013-12-12 ENCOUNTER — Encounter (HOSPITAL_COMMUNITY): Payer: Self-pay | Admitting: Emergency Medicine

## 2013-12-12 DIAGNOSIS — I517 Cardiomegaly: Secondary | ICD-10-CM | POA: Diagnosis not present

## 2013-12-12 DIAGNOSIS — I129 Hypertensive chronic kidney disease with stage 1 through stage 4 chronic kidney disease, or unspecified chronic kidney disease: Secondary | ICD-10-CM | POA: Diagnosis present

## 2013-12-12 DIAGNOSIS — N183 Chronic kidney disease, stage 3 unspecified: Secondary | ICD-10-CM | POA: Diagnosis not present

## 2013-12-12 DIAGNOSIS — E0590 Thyrotoxicosis, unspecified without thyrotoxic crisis or storm: Secondary | ICD-10-CM | POA: Diagnosis present

## 2013-12-12 DIAGNOSIS — J40 Bronchitis, not specified as acute or chronic: Secondary | ICD-10-CM | POA: Diagnosis not present

## 2013-12-12 DIAGNOSIS — Z7901 Long term (current) use of anticoagulants: Secondary | ICD-10-CM

## 2013-12-12 DIAGNOSIS — I4891 Unspecified atrial fibrillation: Secondary | ICD-10-CM | POA: Diagnosis not present

## 2013-12-12 DIAGNOSIS — R0989 Other specified symptoms and signs involving the circulatory and respiratory systems: Secondary | ICD-10-CM | POA: Diagnosis not present

## 2013-12-12 DIAGNOSIS — E7800 Pure hypercholesterolemia, unspecified: Secondary | ICD-10-CM | POA: Diagnosis not present

## 2013-12-12 DIAGNOSIS — K219 Gastro-esophageal reflux disease without esophagitis: Secondary | ICD-10-CM | POA: Diagnosis present

## 2013-12-12 DIAGNOSIS — I350 Nonrheumatic aortic (valve) stenosis: Secondary | ICD-10-CM

## 2013-12-12 DIAGNOSIS — I359 Nonrheumatic aortic valve disorder, unspecified: Secondary | ICD-10-CM | POA: Diagnosis not present

## 2013-12-12 DIAGNOSIS — J189 Pneumonia, unspecified organism: Principal | ICD-10-CM | POA: Diagnosis present

## 2013-12-12 DIAGNOSIS — I447 Left bundle-branch block, unspecified: Secondary | ICD-10-CM | POA: Diagnosis present

## 2013-12-12 DIAGNOSIS — K921 Melena: Secondary | ICD-10-CM | POA: Diagnosis present

## 2013-12-12 DIAGNOSIS — R7989 Other specified abnormal findings of blood chemistry: Secondary | ICD-10-CM | POA: Diagnosis present

## 2013-12-12 DIAGNOSIS — I10 Essential (primary) hypertension: Secondary | ICD-10-CM | POA: Diagnosis not present

## 2013-12-12 DIAGNOSIS — E032 Hypothyroidism due to medicaments and other exogenous substances: Secondary | ICD-10-CM | POA: Diagnosis not present

## 2013-12-12 DIAGNOSIS — M810 Age-related osteoporosis without current pathological fracture: Secondary | ICD-10-CM | POA: Diagnosis not present

## 2013-12-12 DIAGNOSIS — I498 Other specified cardiac arrhythmias: Secondary | ICD-10-CM | POA: Diagnosis present

## 2013-12-12 DIAGNOSIS — R600 Localized edema: Secondary | ICD-10-CM

## 2013-12-12 DIAGNOSIS — Z5181 Encounter for therapeutic drug level monitoring: Secondary | ICD-10-CM

## 2013-12-12 DIAGNOSIS — E119 Type 2 diabetes mellitus without complications: Secondary | ICD-10-CM | POA: Diagnosis present

## 2013-12-12 DIAGNOSIS — R0609 Other forms of dyspnea: Secondary | ICD-10-CM | POA: Diagnosis not present

## 2013-12-12 DIAGNOSIS — R001 Bradycardia, unspecified: Secondary | ICD-10-CM

## 2013-12-12 DIAGNOSIS — J9819 Other pulmonary collapse: Secondary | ICD-10-CM | POA: Diagnosis not present

## 2013-12-12 DIAGNOSIS — R799 Abnormal finding of blood chemistry, unspecified: Secondary | ICD-10-CM | POA: Diagnosis not present

## 2013-12-12 DIAGNOSIS — R062 Wheezing: Secondary | ICD-10-CM | POA: Diagnosis not present

## 2013-12-12 LAB — BASIC METABOLIC PANEL
BUN: 20 mg/dL (ref 6–23)
CO2: 24 mEq/L (ref 19–32)
Calcium: 8.4 mg/dL (ref 8.4–10.5)
Chloride: 102 mEq/L (ref 96–112)
Creatinine, Ser: 1.07 mg/dL (ref 0.50–1.10)
GFR calc Af Amer: 51 mL/min — ABNORMAL LOW (ref >90)
GFR calc non Af Amer: 44 mL/min — ABNORMAL LOW (ref >90)
Glucose, Bld: 115 mg/dL — ABNORMAL HIGH (ref 70–99)
Potassium: 4.6 mEq/L (ref 3.7–5.3)
Sodium: 140 mEq/L (ref 137–147)

## 2013-12-12 LAB — CBC
HCT: 35 % — ABNORMAL LOW (ref 36.0–46.0)
Hemoglobin: 12 g/dL (ref 12.0–15.0)
MCH: 31.6 pg (ref 26.0–34.0)
MCHC: 34.3 g/dL (ref 30.0–36.0)
MCV: 92.1 fL (ref 78.0–100.0)
Platelets: 236 10*3/uL (ref 150–400)
RBC: 3.8 MIL/uL — ABNORMAL LOW (ref 3.87–5.11)
RDW: 14.3 % (ref 11.5–15.5)
WBC: 6.1 10*3/uL (ref 4.0–10.5)

## 2013-12-12 LAB — I-STAT TROPONIN, ED: Troponin i, poc: 0 ng/mL (ref 0.00–0.08)

## 2013-12-12 LAB — PROTIME-INR
INR: 2.66 — ABNORMAL HIGH (ref 0.00–1.49)
Prothrombin Time: 27.4 seconds — ABNORMAL HIGH (ref 11.6–15.2)

## 2013-12-12 LAB — PRO B NATRIURETIC PEPTIDE: Pro B Natriuretic peptide (BNP): 3333 pg/mL — ABNORMAL HIGH (ref 0–450)

## 2013-12-12 IMAGING — CR DG CHEST 2V
2 series · 2 of 2 positions shown · non-contrast
Comparison: [DATE]

CLINICAL DATA: Chest pain, shortness of breath, rectal bleeding

EXAM:
CHEST  2 VIEW

[w chest pa *]
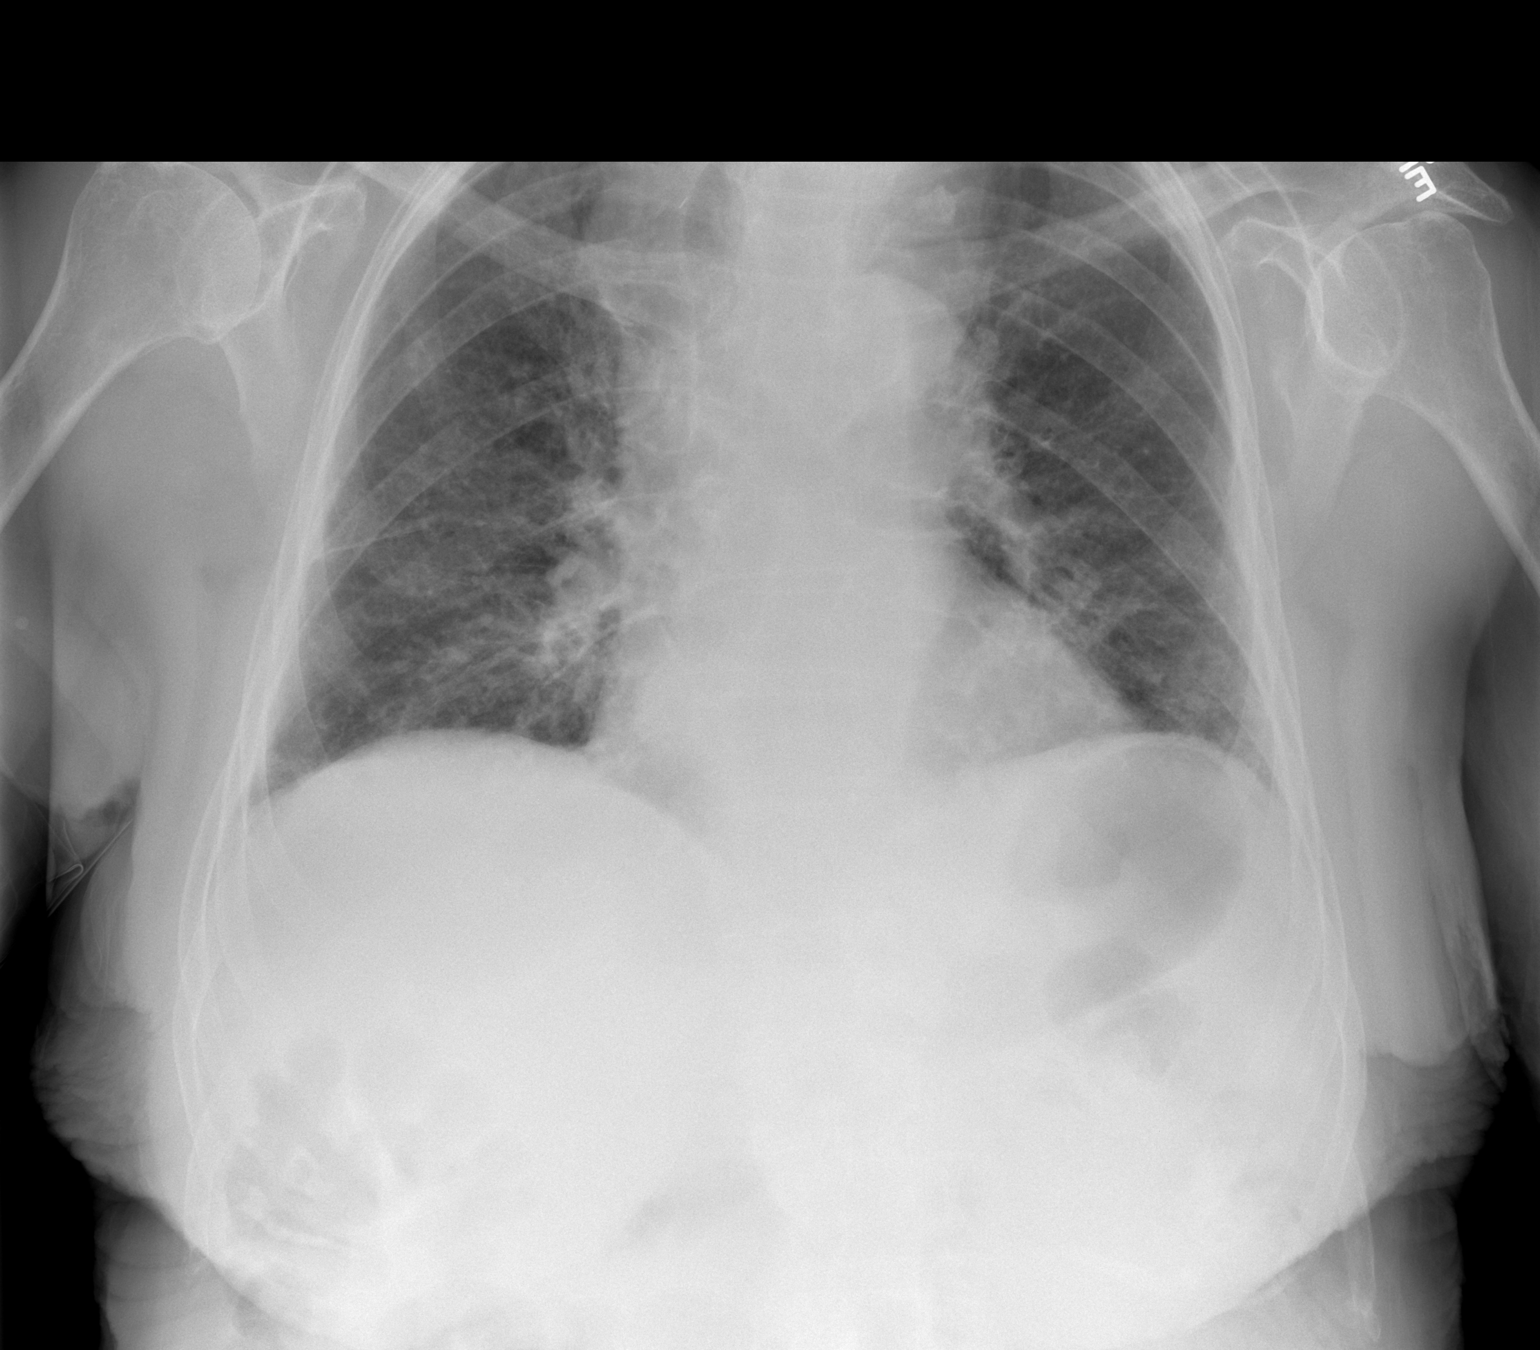

[w chest lat *]
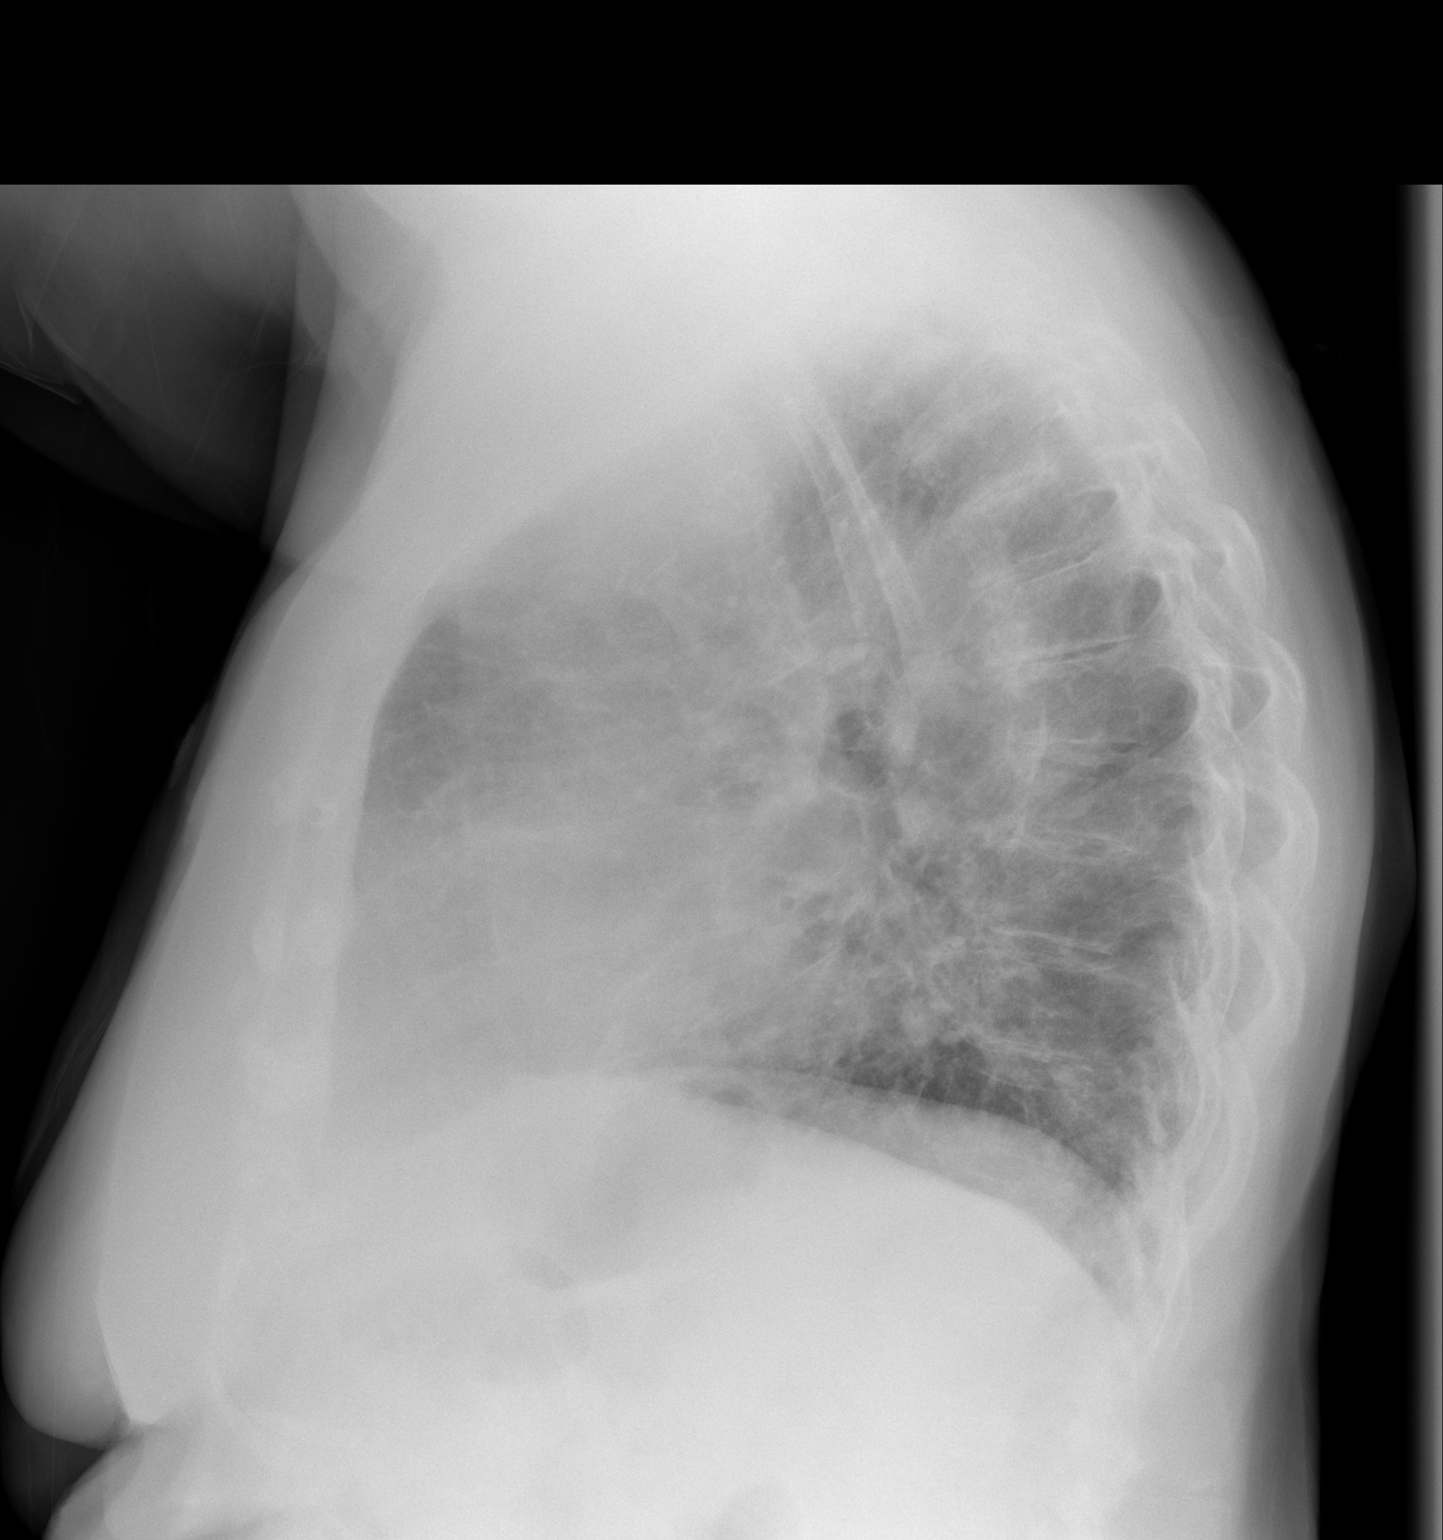

[2 of 2 positions shown; findings below may reference images not displayed]

FINDINGS: Enlargement of cardiac silhouette.

Tortuous aorta.

Mediastinal contours and pulmonary vascularity normal.

Peribronchial thickening with question subtle RIGHT perihilar
infiltrate.

Minimal bibasilar atelectasis.

Upper lungs clear.

No pleural effusion or pneumothorax.

Bones demineralized.
IMPRESSION: Enlargement of cardiac silhouette.

Bronchitic changes with minimal bibasilar atelectasis and
questionable RIGHT perihilar infiltrate.

## 2013-12-12 MED ORDER — ALBUTEROL SULFATE (2.5 MG/3ML) 0.083% IN NEBU: 2.5000 mg | INHALATION_SOLUTION | RESPIRATORY_TRACT | Status: DC | PRN

## 2013-12-12 MED ORDER — DEXTROSE 5 % IV SOLN
500.0000 mg | INTRAVENOUS | Status: DC
  Administered 2013-12-12: 500 mg via INTRAVENOUS

## 2013-12-12 MED ORDER — ALBUTEROL SULFATE (2.5 MG/3ML) 0.083% IN NEBU
5.0000 mg | INHALATION_SOLUTION | Freq: Once | RESPIRATORY_TRACT | Status: AC
  Administered 2013-12-12: 5 mg via RESPIRATORY_TRACT
  Filled 2013-12-12: qty 6

## 2013-12-12 MED ORDER — DEXTROSE 5 % IV SOLN
500.0000 mg | INTRAVENOUS | Status: DC
  Administered 2013-12-13: 500 mg via INTRAVENOUS
  Filled 2013-12-12 (×2): qty 500

## 2013-12-12 MED ORDER — DEXTROSE 5 % IV SOLN
1.0000 g | Freq: Once | INTRAVENOUS | Status: AC
  Administered 2013-12-12: 1 g via INTRAVENOUS
  Filled 2013-12-12: qty 10

## 2013-12-12 MED ORDER — SIMVASTATIN 5 MG PO TABS
5.0000 mg | ORAL_TABLET | Freq: Every day | ORAL | Status: DC
  Administered 2013-12-13 – 2013-12-14 (×2): 5 mg via ORAL
  Filled 2013-12-12 (×3): qty 1

## 2013-12-12 MED ORDER — METHIMAZOLE 5 MG PO TABS
5.0000 mg | ORAL_TABLET | Freq: Every day | ORAL | Status: DC
  Administered 2013-12-13 – 2013-12-15 (×3): 5 mg via ORAL
  Filled 2013-12-12 (×3): qty 1

## 2013-12-12 MED ORDER — AMIODARONE HCL 100 MG PO TABS
150.0000 mg | ORAL_TABLET | Freq: Every day | ORAL | Status: DC
  Administered 2013-12-13 – 2013-12-15 (×3): 150 mg via ORAL
  Filled 2013-12-12 (×3): qty 2

## 2013-12-12 MED ORDER — DEXTROSE 5 % IV SOLN: 500.0000 mg | INTRAVENOUS | Status: DC

## 2013-12-12 MED ORDER — LISINOPRIL 20 MG PO TABS
20.0000 mg | ORAL_TABLET | Freq: Two times a day (BID) | ORAL | Status: DC
  Administered 2013-12-12 – 2013-12-15 (×6): 20 mg via ORAL
  Filled 2013-12-12 (×7): qty 1

## 2013-12-12 MED ORDER — INSULIN ASPART 100 UNIT/ML ~~LOC~~ SOLN: 0.0000 [IU] | Freq: Every day | SUBCUTANEOUS | Status: DC

## 2013-12-12 MED ORDER — DEXTROSE 5 % IV SOLN
1.0000 g | INTRAVENOUS | Status: DC
  Administered 2013-12-13: 1 g via INTRAVENOUS
  Filled 2013-12-12 (×2): qty 10

## 2013-12-12 MED ORDER — INSULIN ASPART 100 UNIT/ML ~~LOC~~ SOLN: 0.0000 [IU] | Freq: Three times a day (TID) | SUBCUTANEOUS | Status: DC

## 2013-12-12 MED ORDER — DEXTROSE 5 % IV SOLN: 1.0000 g | INTRAVENOUS | Status: DC

## 2013-12-12 NOTE — H&P (Signed)
Hospitalist Admission History and Physical  Patient name: Alexa Mcdonald Medical record number: [id number] Date of birth: Sep 15, 1920 Age: 80 y.o. Gender: female  Primary Care Provider: [name], MD Cardiologist: [name]   Chief Complaint: cough, CAP History of Present Illness:This is a 80 y.o. year old female with prior history of atrophic fibrillation on Coumadin, stage III  chronic kidney disease, hypertension,  Diet-controlled diabetes presenting with dyspnea and cough.  Patient states that she's had progressive cough and shortness of breath since earlier today. Says the cough has been nonproductive. Has had some associated pleuritic chest pain is worse with coughing. Denies any fevers chills. No known sick contacts. Does have some mild shortness of breath this seems to be worse with coughing. No nausea or vomiting. Denies any lower extremity swelling.  States she had one episode of bright red blood per rectum today. Noticed some clots in the toilet. Denies any recent history of this. Presented to her PCP with symptoms earlier today and was directed to the ER.  On presentation, patient hemodynamically stable temperature 99.2. Satting 94-100% on room air. White blood cell count 6.1. Hemoglobin 12. Creatinine 1.07. INR of 2.6. hest x-ray shows enlarged cardiac silhouette and bronchitic changes with questionable right perihilar infiltrate. Pro BNP and hemoccult are pending.   Patient Active Problem List   Diagnosis Date Noted  . Cough 12/12/2013  . Encounter for therapeutic drug monitoring 08/29/2013  . Aortic stenosis, severe 08/05/2013  . Essential hypertension 08/05/2013  . Edema extremities 08/05/2013  . Atrial fibrillation 11/27/2011  . Bradycardia 11/27/2011   Past Medical History: Past Medical History  Diagnosis Date  . Mediastinal goiter     removed 1988  . CKD (chronic kidney disease)     stage III  . Osteopenia   . Osteoporosis   . Vitamin D deficiency   .  Paroxysmal atrial fibrillation   . Hypertension   . LBBB (left bundle branch block)   . Aortic valve sclerosis   . Hx of orthostatic hypotension   . Colon, diverticulosis   . Tachycardia-bradycardia   . Diabetes mellitus     diet controlled  . Esophageal reflux   . Aortic stenosis, severe 04/2013  . GERD (gastroesophageal reflux disease)     Past Surgical History: Past Surgical History  Procedure Laterality Date  . Cataract extraction    . Carpal tunnel release    . Mri  08/18/07    head (diabetes heart study at [facility])  . Mediastinal removal of a goiter      Social History: History   Social History  . Marital Status: Widowed    Spouse Name: N/A    Number of Children: 3  . Years of Education: N/A   Occupational History  . retire    Social History Main Topics  . Smoking status: Never Smoker   . Smokeless tobacco: None  . Alcohol Use: No  . Drug Use: No  . Sexual Activity: None   Other Topics Concern  . None   Social History Narrative   Lives in [place].  Widowed.    Family History: Family History  Problem Relation Age of Onset  . Diabetes Mother   . Cancer - Prostate Father   . Pulmonary embolism Father   . Hypertension Sister   . Diabetes Sister   . Hypertension Brother   . Arrhythmia Brother     afib  . CAD Brother   . Pulmonary fibrosis Brother     Allergies: Allergies  Allergen Reactions  . Amoxicillin Other (See Comments)    tongue felt bruised.  . Celebrex [Celecoxib] Other (See Comments)    Mouth sores    Current Facility-Administered Medications  Medication Dose Route Frequency Provider Last Rate Last Dose  . amiodarone (PACERONE) tablet 150 mg  150 mg Oral Daily Shanda Howells, MD      . azithromycin [facility]) 500 mg in dextrose 5 % 250 mL IVPB  500 mg Intravenous Q24H Resa Miner Lawyer, PA-C   500 mg at 12/12/13 1806  . azithromycin (ZITHROMAX) 500 mg in dextrose 5 % 250 mL IVPB  500 mg Intravenous Q24H Shanda Howells, MD      .  cefTRIAXone (ROCEPHIN) 1 g in dextrose 5 % 50 mL IVPB  1 g Intravenous Q24H Shanda Howells, MD      . lisinopril (PRINIVIL,ZESTRIL) tablet 20 mg  20 mg Oral BID Shanda Howells, MD      . methimazole (TAPAZOLE) tablet 5 mg  5 mg Oral Daily Shanda Howells, MD      . Derrill Memo ON 12/13/2013] simvastatin (ZOCOR) tablet 5 mg  5 mg Oral q1800 Shanda Howells, MD       Current Outpatient Prescriptions  Medication Sig Dispense Refill  . acetaminophen (TYLENOL) 500 MG tablet Take 500 mg by mouth every 6 (six) hours as needed for moderate pain.      [name] amiodarone (PACERONE) 200 MG tablet Take 150 mg by mouth daily.      . Calcium Carb-Cholecalciferol (CALCIUM 600/VITAMIN D3 PO) Take 1 tablet by mouth 2 (two) times daily.      [name] guaiFENesin (MUCINEX) 600 MG 12 hr tablet Take 600 mg by mouth 2 (two) times daily as needed for cough.      [name] lisinopril (PRINIVIL,ZESTRIL) 20 MG tablet Take 1 tablet (20 mg total) by mouth 2 (two) times daily.  60 tablet  5  . methimazole (TAPAZOLE) 10 MG tablet Take 5 mg by mouth daily.       . pravastatin (PRAVACHOL) 10 MG tablet Take 10 mg by mouth at bedtime.       [name] warfarin (COUMADIN) 2.5 MG tablet take 1 tablet by mouth once daily EXCEPT 1AND1/2 TABLET ON THURSDAY  45 tablet  5   Review Of Systems: 12 point ROS negative except as noted above in HPI.  Physical Exam: Filed Vitals:   12/12/13 1800  BP: 164/75  Pulse: 57  Temp:   Resp: 24    General: alert and cooperative HEENT: PERRLA and extra ocular movement intact Heart: S1, S2 normal, no murmur, rub or gallop, regular rate and rhythm Lungs: + mild wheezing, mild RLL rales  Abdomen: abdomen is soft without significant tenderness, masses, organomegaly or guarding Extremities: extremities normal, atraumatic, no cyanosis or edema Skin:no rashes, no ecchymoses Neurology: normal without focal findings  Labs and Imaging: Lab Results  Component Value Date/Time   NA 140 12/12/2013  3:49 PM   K 4.6 12/12/2013  3:49 PM   CL  102 12/12/2013  3:49 PM   CO2 24 12/12/2013  3:49 PM   BUN 20 12/12/2013  3:49 PM   CREATININE 1.07 12/12/2013  3:49 PM   GLUCOSE 115* 12/12/2013  3:49 PM   Lab Results  Component Value Date   WBC 6.1 12/12/2013   HGB 12.0 12/12/2013   HCT 35.0* 12/12/2013   MCV 92.1 12/12/2013   PLT 236 12/12/2013    Dg Chest 2 View  12/12/2013   CLINICAL DATA:  Chest pain,  shortness of breath, rectal bleeding  EXAM: CHEST  2 VIEW  COMPARISON:  12/04/2009  FINDINGS: Enlargement of cardiac silhouette.  Tortuous aorta.  Mediastinal contours and pulmonary vascularity normal.  Peribronchial thickening with question subtle RIGHT perihilar infiltrate.  Minimal bibasilar atelectasis.  Upper lungs clear.  No pleural effusion or pneumothorax.  Bones demineralized.  IMPRESSION: Enlargement of cardiac silhouette.  Bronchitic changes with minimal bibasilar atelectasis and questionable RIGHT perihilar infiltrate.   Electronically Signed   By: Lavonia Dana M.D.   On: 12/12/2013 16:45     Assessment and Plan:  Alexa Mcdonald is a 80 y.o. year old female presenting with Cough, CAP   Cough: PNA on CXR. Port score of 90. Will treat with  Rocephin and azithromycin. Panculture. Urine strep legionella. There may be an element of mild CHF given cardiomegaly on chest x-ray. Pro BNP pending. Fairly euvolemic on exam today.  May require a small dose of IV Lasix. No hypoxia currently. Noted wheezing today which is a new finding.? Viral bronchitis. Continue albuterol. Consider one dose of IV Solu-Medrol. Continue to follow.  BRBPR:  One solitary episode of this today. Higher risk given Coumadin use. Hemoccult pending.  Hemoglobin within normal limits.Continue to follow. GI consult if positive.   A. Fib/HTN: Therapeutic INR today. Continue outpatient regimen including amiodarone. Telemetry bed. Hold Coumadin pending Hemoccult.  HyperTSH: continue tapazole. TSH.   DM: diet controlled outpt. A1C. SSI prn  FEN/GI: Heart healthy diet.   Prophylaxis: therapeutic INR pending hemoccult. SCDs if positive.  Disposition: pending further evaluation  Code Status:Full Code.        Shanda Howells MD  Pager: [phone]

## 2013-12-12 NOTE — ED Notes (Signed)
Per pt sts since Friday she has been having chest discomfort and SOB. sts this am she noticed rectal bleeding. sts clots in toilet. sts some intermittent abdominal pain but worse in chest. Sent her by her doctor for positive occult and crackles in lungs. Was given breathing treatment PTA.

## 2013-12-13 DIAGNOSIS — I359 Nonrheumatic aortic valve disorder, unspecified: Secondary | ICD-10-CM

## 2013-12-13 DIAGNOSIS — N183 Chronic kidney disease, stage 3 unspecified: Secondary | ICD-10-CM | POA: Diagnosis present

## 2013-12-13 DIAGNOSIS — R001 Bradycardia, unspecified: Secondary | ICD-10-CM | POA: Diagnosis present

## 2013-12-13 DIAGNOSIS — R7989 Other specified abnormal findings of blood chemistry: Secondary | ICD-10-CM | POA: Diagnosis present

## 2013-12-13 DIAGNOSIS — K219 Gastro-esophageal reflux disease without esophagitis: Secondary | ICD-10-CM | POA: Diagnosis present

## 2013-12-13 DIAGNOSIS — I4891 Unspecified atrial fibrillation: Secondary | ICD-10-CM

## 2013-12-13 DIAGNOSIS — R799 Abnormal finding of blood chemistry, unspecified: Secondary | ICD-10-CM

## 2013-12-13 DIAGNOSIS — J189 Pneumonia, unspecified organism: Secondary | ICD-10-CM | POA: Diagnosis not present

## 2013-12-13 DIAGNOSIS — K921 Melena: Secondary | ICD-10-CM

## 2013-12-13 DIAGNOSIS — Z7901 Long term (current) use of anticoagulants: Secondary | ICD-10-CM

## 2013-12-13 DIAGNOSIS — E0590 Thyrotoxicosis, unspecified without thyrotoxic crisis or storm: Secondary | ICD-10-CM | POA: Diagnosis present

## 2013-12-13 LAB — GLUCOSE, CAPILLARY
Glucose-Capillary: 131 mg/dL — ABNORMAL HIGH (ref 70–99)
Glucose-Capillary: 107 mg/dL — ABNORMAL HIGH (ref 70–99)
Glucose-Capillary: 128 mg/dL — ABNORMAL HIGH (ref 70–99)
Glucose-Capillary: 177 mg/dL — ABNORMAL HIGH (ref 70–99)

## 2013-12-13 LAB — HEMOGLOBIN A1C
Hgb A1c MFr Bld: 6.2 % — ABNORMAL HIGH (ref <5.7)
Mean Plasma Glucose: 131 mg/dL — ABNORMAL HIGH (ref <117)

## 2013-12-13 LAB — PROTIME-INR
INR: 2.4 — ABNORMAL HIGH (ref 0.00–1.49)
Prothrombin Time: 25.4 seconds — ABNORMAL HIGH (ref 11.6–15.2)

## 2013-12-13 LAB — EXPECTORATED SPUTUM ASSESSMENT W GRAM STAIN, RFLX TO RESP C

## 2013-12-13 LAB — LEGIONELLA ANTIGEN, URINE: Legionella Antigen, Urine: NEGATIVE

## 2013-12-13 LAB — HIV ANTIBODY (ROUTINE TESTING W REFLEX): HIV 1&2 Ab, 4th Generation: NONREACTIVE

## 2013-12-13 LAB — OCCULT BLOOD X 1 CARD TO LAB, STOOL: Fecal Occult Bld: NEGATIVE

## 2013-12-13 LAB — STREP PNEUMONIAE URINARY ANTIGEN: Strep Pneumo Urinary Antigen: NEGATIVE

## 2013-12-13 LAB — HEMOGLOBIN AND HEMATOCRIT, BLOOD
HCT: 34.4 % — ABNORMAL LOW (ref 36.0–46.0)
Hemoglobin: 11.4 g/dL — ABNORMAL LOW (ref 12.0–15.0)

## 2013-12-13 MED ORDER — FUROSEMIDE 10 MG/ML IJ SOLN
10.0000 mg | Freq: Once | INTRAMUSCULAR | Status: AC
  Administered 2013-12-13: 10 mg via INTRAVENOUS

## 2013-12-13 MED ORDER — ACETAMINOPHEN 325 MG PO TABS
650.0000 mg | ORAL_TABLET | Freq: Four times a day (QID) | ORAL | Status: DC | PRN
  Filled 2013-12-13: qty 2

## 2013-12-13 MED ORDER — GUAIFENESIN-DM 100-10 MG/5ML PO SYRP
5.0000 mL | ORAL_SOLUTION | ORAL | Status: DC | PRN
  Administered 2013-12-14 – 2013-12-15 (×3): 5 mL via ORAL
  Filled 2013-12-13 (×3): qty 5

## 2013-12-13 MED ORDER — FUROSEMIDE 10 MG/ML IJ SOLN
INTRAMUSCULAR | Status: AC
  Administered 2013-12-13: 10 mg via INTRAVENOUS
  Filled 2013-12-13: qty 4

## 2013-12-13 NOTE — Progress Notes (Signed)
Patient admitted to 2W from ED. Ambulated from stretcher to bed independently. Tele monitor placed on patient. IV site c/d/i. Patient alert and oriented to unit and room. Assessment and admission completed. Vital signs stable. Patient advised to call for monitor assist to bathroom overnight due to different environment and fall risk. Patient denies pain.Advised of need to collect urine and sputum sample. Patient verbalized understanding. Will continue to monitor.  Lorelee Cover Meline Russaw,RN

## 2013-12-13 NOTE — Progress Notes (Addendum)
[facility] PROGRESS NOTE  Alexa Mcdonald [id number] DOB: [birth year] DOA: 12/12/2013 PCP: [name], MD  Assessment/Plan Principal Problem:   CAP (community acquired pneumonia):  Continue Rocephin and a central mycin Active Problems:   Atrial fibrillation: Currently in sinus   Aortic stenosis, severe   Hematochezia: None further. Coumadin held. Repeat H&H. If no continued bleeding, can defer the workup to outpatient setting.  CKD (chronic kidney disease), stage III   Hyperthyroidism   GERD (gastroesophageal reflux disease)   Long term (current) use of anticoagulants   Elevated brain natriuretic peptide (BNP) level: Clinically without evidence of congestive heart failure.  EF normal last echo   Sinus bradycardia  Will consult physical therapy, as patient lives alone and may require assistance at discharge.  Code Status: Full Family Communication:  Disposition Plan: Home  Consultants:    Procedures:    Antibiotics:  Rocephin and azithromycin 12/12/2013   HPI/Subjective: Feels a little better. Cough improved. Weakness improved. No dyspnea. No orthopnea. No leg swelling. No chest pain. No further hematochezia. Had a flexible sigmoidoscopy many years ago but no history of colonoscopy.  Objective: Filed Vitals:   12/13/13 0451  BP: 145/72  Pulse: 51  Temp: 98.9 F (37.2 C)  Resp: 17   No intake or output data in the 24 hours ending 12/13/13 0841 Filed Weights   12/12/13 2046  Weight: 58.378 kg (128 lb 11.2 oz)   Telemetry: Sinus bradycardia rate about 50  Exam:   General:  comfortable. Occasional cough. Breathing nonlabored.  Cardiovascular: slow regular. Systolic and diastolic murmurs present   Respiratory: No wheezes rhonchi or rales  Abdomen: soft nontender nondistended  Musculoskeletal: nuclear cyanosis or edema  Data Reviewed: Basic Metabolic Panel:  Recent Labs Lab 12/12/13 1549  NA 140  K 4.6  CL 102  CO2 24  GLUCOSE  115*  BUN 20  CREATININE 1.07  CALCIUM 8.4   Liver Function Tests: No results found for this basename: AST, ALT, ALKPHOS, BILITOT, PROT, ALBUMIN,  in the last 168 hours No results found for this basename: LIPASE, AMYLASE,  in the last 168 hours No results found for this basename: AMMONIA,  in the last 168 hours CBC:  Recent Labs Lab 12/12/13 1549  WBC 6.1  HGB 12.0  HCT 35.0*  MCV 92.1  PLT 236   Cardiac Enzymes: No results found for this basename: CKTOTAL, CKMB, CKMBINDEX, TROPONINI,  in the last 168 hours BNP (last 3 results)  Recent Labs  12/12/13 2135  PROBNP 3333.0*   CBG:  Recent Labs Lab 12/12/13 2034 12/13/13 0640  GLUCAP 131* 107*    Recent Results (from the past 240 hour(s))  CULTURE, EXPECTORATED SPUTUM-ASSESSMENT     Status: None   Collection Time    12/13/13  6:49 AM      Result Value Ref Range Status   Specimen Description SPUTUM   Final   Special Requests NONE   Final   Sputum evaluation     Final   Value: THIS SPECIMEN IS ACCEPTABLE. RESPIRATORY CULTURE REPORT TO FOLLOW.   Report Status 12/13/2013 FINAL   Final     Studies: Dg Chest 2 View  12/12/2013   CLINICAL DATA:  Chest pain, shortness of breath, rectal bleeding  EXAM: CHEST  2 VIEW  COMPARISON:  12/04/2009  FINDINGS: Enlargement of cardiac silhouette.  Tortuous aorta.  Mediastinal contours and pulmonary vascularity normal.  Peribronchial thickening with question subtle RIGHT perihilar infiltrate.  Minimal bibasilar atelectasis.  Upper lungs clear.  No pleural effusion or pneumothorax.  Bones demineralized.  IMPRESSION: Enlargement of cardiac silhouette.  Bronchitic changes with minimal bibasilar atelectasis and questionable RIGHT perihilar infiltrate.   Electronically Signed   By: Lavonia Dana M.D.   On: 12/12/2013 16:45    Scheduled Meds: . amiodarone  150 mg Oral Daily  . azithromycin  500 mg Intravenous Q24H  . cefTRIAXone (ROCEPHIN)  IV  1 g Intravenous Q24H  . insulin aspart  0-5  Units Subcutaneous QHS  . insulin aspart  0-9 Units Subcutaneous TID WC  . lisinopril  20 mg Oral BID  . methimazole  5 mg Oral Daily  . simvastatin  5 mg Oral q1800   Continuous Infusions:   Time spent: 35 min  Delfina Redwood, MD  [facility] Pager [phone]. If 7PM-7AM, please contact night-coverage at [email or web address], password [facility] 12/13/2013, 8:41 AM  LOS: 1 day

## 2013-12-13 NOTE — Progress Notes (Signed)
UR completed. Patient changed to inpatient- requiring IV antibiotics X 2

## 2013-12-13 NOTE — Progress Notes (Signed)
INITIAL NUTRITION ASSESSMENT  DOCUMENTATION CODES Per approved criteria  -Not Applicable   INTERVENTION: No nutrition intervention at this time --- patient declined RD to follow for nutrition care plan  NUTRITION DIAGNOSIS: Inadequate oral intake related to limited appetite as evidenced by pt report  Goal: Pt to meet >/= 90% of their estimated nutrition needs   Monitor:  PO & supplemental intake, weight, labs, I/O's  Reason for Assessment: Malnutrition Screening Tool Report  80 y.o. female  Admitting Dx: CAP (community acquired pneumonia)  ASSESSMENT: 80 y.o. year old female with PMH of atrophic fibrillation on Coumadin, stage III CKD and HTN; presenting with dyspnea and cough; chest X-ray showed enlargement of cardiac silhouette and bronchitic changes with minimal bibasilar atelectasis and questionable RIGHT perihilar infiltrate.  Patient reports her appetite is fair; no reported weight loss; PO intake 50% per flowsheet records; declined oral nutrition supplements.  No muscle or subcutaneous fat depletion noticed.  Height: Ht Readings from Last 1 Encounters:  12/12/13 5' 0.5" (1.537 m)    Weight: Wt Readings from Last 1 Encounters:  12/12/13 128 lb 11.2 oz (58.378 kg)    Ideal Body Weight: 100 lb  % Ideal Body Weight: 128%  Wt Readings from Last 10 Encounters:  12/12/13 128 lb 11.2 oz (58.378 kg)  08/29/13 130 lb 6.4 oz (59.149 kg)  04/07/13 143 lb (64.864 kg)  08/30/12 129 lb (58.514 kg)  06/06/12 133 lb (60.328 kg)  06/02/12 133 lb (60.328 kg)  03/01/12 135 lb (61.236 kg)  11/27/11 135 lb 12.8 oz (61.598 kg)    Usual Body Weight: 130 lb  % Usual Body Weight: 98%  BMI:  Body mass index is 24.71 kg/(m^2).  Estimated Nutritional Needs: Kcal: 1400-1600 Protein: 70-80 gm Fluid: >/= 1.5 L  Skin: Intact  Diet Order: Carb Control  EDUCATION NEEDS: -No education needs identified at this time   Intake/Output Summary (Last 24 hours) at 12/13/13  1523 Last data filed at 12/13/13 1246  Gross per 24 hour  Intake    480 ml  Output      0 ml  Net    480 ml    Labs:   Recent Labs Lab 12/12/13 1549  NA 140  K 4.6  CL 102  CO2 24  BUN 20  CREATININE 1.07  CALCIUM 8.4  GLUCOSE 115*    CBG (last 3)   Recent Labs  12/12/13 2034 12/13/13 0640  GLUCAP 131* 107*    Scheduled Meds: . amiodarone  150 mg Oral Daily  . azithromycin  500 mg Intravenous Q24H  . cefTRIAXone (ROCEPHIN)  IV  1 g Intravenous Q24H  . insulin aspart  0-5 Units Subcutaneous QHS  . insulin aspart  0-9 Units Subcutaneous TID WC  . lisinopril  20 mg Oral BID  . methimazole  5 mg Oral Daily  . simvastatin  5 mg Oral q1800    Continuous Infusions:   Past Medical History  Diagnosis Date  . Mediastinal goiter     removed 1988  . CKD (chronic kidney disease)     stage III  . Osteopenia   . Osteoporosis   . Vitamin D deficiency   . Paroxysmal atrial fibrillation   . Hypertension   . LBBB (left bundle branch block)   . Aortic valve sclerosis   . Hx of orthostatic hypotension   . Colon, diverticulosis   . Tachycardia-bradycardia   . Diabetes mellitus     diet controlled  . Esophageal reflux   .  Aortic stenosis, severe 04/2013  . GERD (gastroesophageal reflux disease)     Past Surgical History  Procedure Laterality Date  . Cataract extraction    . Carpal tunnel release    . Mri  08/18/07    head (diabetes [facility])  . Mediastinal removal of a goiter      Arthur Holms, RD, LDN Pager #: [phone] After-Hours Pager #: [phone]

## 2013-12-13 NOTE — Evaluation (Signed)
Physical Therapy Evaluation Patient Details Name: TASHAYLA GRODEN MRN: [id number] DOB: [birth year] Today's Date: 12/13/2013   History of Present Illness  Pt adm with PNA.  Clinical Impression  Pt doing well with mobility and no further PT needed.  Ready for dc from PT standpoint.       Follow Up Recommendations No PT follow up    Equipment Recommendations  None recommended by PT    Recommendations for Other Services       Precautions / Restrictions Precautions Precautions: None      Mobility  Bed Mobility                  Transfers Overall transfer level: Modified independent Equipment used: None                Ambulation/Gait Ambulation/Gait assistance: Modified independent (Device/Increase time) Ambulation Distance (Feet): 225 Feet Assistive device: None Gait Pattern/deviations: Step-through pattern;Decreased stride length        Stairs            Wheelchair Mobility    Modified Rankin (Stroke Patients Only)       Balance           Standing balance support: No upper extremity supported Standing balance-Leahy Scale: Good                               Pertinent Vitals/Pain VSS    Home Living Family/patient expects to be discharged to:: Private residence Living Arrangements: Alone   Type of Home: House       Home Layout: One level Home Equipment: Environmental consultant - 2 wheels;Cane - single point      Prior Function Level of Independence: Independent with assistive device(s)         Comments: Uses cane for longer distances in community     Hand Dominance        Extremity/Trunk Assessment   Upper Extremity Assessment: Overall WFL for tasks assessed           Lower Extremity Assessment: Overall WFL for tasks assessed         Communication   Communication: No difficulties  Cognition Arousal/Alertness: Awake/alert Behavior During Therapy: WFL for tasks assessed/performed Overall Cognitive  Status: Within Functional Limits for tasks assessed                      General Comments      Exercises        Assessment/Plan    PT Assessment Patent does not need any further PT services  PT Diagnosis     PT Problem List    PT Treatment Interventions     PT Goals (Current goals can be found in the Care Plan section) Acute Rehab PT Goals PT Goal Formulation: No goals set, d/c therapy    Frequency     Barriers to discharge        Co-evaluation               End of Session   Activity Tolerance: Patient tolerated treatment well Patient left: in chair;with call bell/phone within reach           Time: 1541-1550 PT Time Calculation (min): 9 min   Charges:   PT Evaluation $Initial PT Evaluation Tier I: 1 Procedure     PT G CodesShary Decamp Marnell Mcdaniel 12/13/2013, 4:19 PM  [employer] PT [phone]

## 2013-12-14 DIAGNOSIS — I498 Other specified cardiac arrhythmias: Secondary | ICD-10-CM

## 2013-12-14 DIAGNOSIS — N183 Chronic kidney disease, stage 3 unspecified: Secondary | ICD-10-CM

## 2013-12-14 DIAGNOSIS — I359 Nonrheumatic aortic valve disorder, unspecified: Secondary | ICD-10-CM | POA: Diagnosis not present

## 2013-12-14 DIAGNOSIS — J189 Pneumonia, unspecified organism: Secondary | ICD-10-CM | POA: Diagnosis not present

## 2013-12-14 LAB — GLUCOSE, CAPILLARY
Glucose-Capillary: 100 mg/dL — ABNORMAL HIGH (ref 70–99)
Glucose-Capillary: 112 mg/dL — ABNORMAL HIGH (ref 70–99)
Glucose-Capillary: 113 mg/dL — ABNORMAL HIGH (ref 70–99)

## 2013-12-14 LAB — PROTIME-INR
INR: 1.91 — ABNORMAL HIGH (ref 0.00–1.49)
Prothrombin Time: 21.3 seconds — ABNORMAL HIGH (ref 11.6–15.2)

## 2013-12-14 MED ORDER — AZITHROMYCIN 500 MG PO TABS
500.0000 mg | ORAL_TABLET | ORAL | Status: DC
  Administered 2013-12-14: 500 mg via ORAL
  Filled 2013-12-14 (×2): qty 1

## 2013-12-14 MED ORDER — WARFARIN SODIUM 4 MG PO TABS
4.0000 mg | ORAL_TABLET | Freq: Once | ORAL | Status: AC
  Administered 2013-12-14: 4 mg via ORAL
  Filled 2013-12-14: qty 1

## 2013-12-14 MED ORDER — CEFUROXIME AXETIL 500 MG PO TABS
500.0000 mg | ORAL_TABLET | Freq: Two times a day (BID) | ORAL | Status: DC
  Administered 2013-12-14 – 2013-12-15 (×2): 500 mg via ORAL
  Filled 2013-12-14 (×4): qty 1

## 2013-12-14 MED ORDER — WARFARIN - PHARMACIST DOSING INPATIENT: Freq: Every day | Status: DC

## 2013-12-14 NOTE — Progress Notes (Signed)
[facility] PROGRESS NOTE  Alexa Mcdonald [id number] DOB: 1921/01/08 DOA: 12/12/2013 PCP: [name], MD  Assessment/Plan    CAP (community acquired pneumonia):  Change to PO abx Nebs, robitussin    Atrial fibrillation: Currently in sinus   Aortic stenosis, severe   Hematochezia: None further. Coumadin resumed; defer further wok up to outpatient  CKD (chronic kidney disease), stage III   Hyperthyroidism   GERD (gastroesophageal reflux disease)   Long term (current) use of anticoagulants   Elevated brain natriuretic peptide (BNP) level: Clinically without evidence of congestive heart failure.  EF normal last echo   Sinus bradycardia  No PT needs  Code Status: Full Family Communication:  Disposition Plan: Home when feeling better  Consultants:    Procedures:     HPI/Subjective: Cough worsened today Not feeling well  Objective: Filed Vitals:   12/14/13 0500  BP: 126/51  Pulse: 45  Temp: 98.1 F (36.7 C)  Resp: 18    Intake/Output Summary (Last 24 hours) at 12/14/13 1023 Last data filed at 12/14/13 0730  Gross per 24 hour  Intake    840 ml  Output   1401 ml  Net   -561 ml   Filed Weights   12/12/13 2046  Weight: 58.378 kg (128 lb 11.2 oz)   Telemetry: Sinus bradycardia rate about 50  Exam:   General:  comfortable. Occasional cough. Breathing nonlabored.  Cardiovascular: slow regular. Systolic and diastolic murmurs present   Respiratory: coarse breath sounds  Abdomen: soft nontender nondistended  Musculoskeletal: no edema  Data Reviewed: Basic Metabolic Panel:  Recent Labs Lab 12/12/13 1549  NA 140  K 4.6  CL 102  CO2 24  GLUCOSE 115*  BUN 20  CREATININE 1.07  CALCIUM 8.4   Liver Function Tests: No results found for this basename: AST, ALT, ALKPHOS, BILITOT, PROT, ALBUMIN,  in the last 168 hours No results found for this basename: LIPASE, AMYLASE,  in the last 168 hours No results found for this basename:  AMMONIA,  in the last 168 hours CBC:  Recent Labs Lab 12/12/13 1549 12/13/13 0946  WBC 6.1  --   HGB 12.0 11.4*  HCT 35.0* 34.4*  MCV 92.1  --   PLT 236  --    Cardiac Enzymes: No results found for this basename: CKTOTAL, CKMB, CKMBINDEX, TROPONINI,  in the last 168 hours BNP (last 3 results)  Recent Labs  12/12/13 2135  PROBNP 3333.0*   CBG:  Recent Labs Lab 12/12/13 2034 12/13/13 0640 12/13/13 1645 12/13/13 2049 12/14/13 0714  GLUCAP 131* 107* 128* 177* 100*    Recent Results (from the past 240 hour(s))  CULTURE, BLOOD (ROUTINE X 2)     Status: None   Collection Time    12/12/13  9:35 PM      Result Value Ref Range Status   Specimen Description BLOOD LEFT ARM   Final   Special Requests BOTTLES DRAWN AEROBIC ONLY 5CC   Final   Culture  Setup Time     Final   Value: 12/13/2013 04:10     Performed at [facility]   Culture     Final   Value:        BLOOD CULTURE RECEIVED NO GROWTH TO DATE CULTURE WILL BE HELD FOR 5 DAYS BEFORE ISSUING A FINAL NEGATIVE REPORT     Performed at [facility]   Report Status PENDING   Incomplete  CULTURE, BLOOD (ROUTINE X 2)     Status: None  Collection Time    12/12/13  9:39 PM      Result Value Ref Range Status   Specimen Description BLOOD RIGHT ARM   Final   Special Requests BOTTLES DRAWN AEROBIC ONLY 5CC   Final   Culture  Setup Time     Final   Value: 12/13/2013 04:10     Performed at [facility]   Culture     Final   Value:        BLOOD CULTURE RECEIVED NO GROWTH TO DATE CULTURE WILL BE HELD FOR 5 DAYS BEFORE ISSUING A FINAL NEGATIVE REPORT     Performed at [facility]   Report Status PENDING   Incomplete  CULTURE, EXPECTORATED SPUTUM-ASSESSMENT     Status: None   Collection Time    12/13/13  6:49 AM      Result Value Ref Range Status   Specimen Description SPUTUM   Final   Special Requests NONE   Final   Sputum evaluation     Final   Value: THIS SPECIMEN IS ACCEPTABLE. RESPIRATORY  CULTURE REPORT TO FOLLOW.   Report Status 12/13/2013 FINAL   Final  CULTURE, RESPIRATORY (NON-EXPECTORATED)     Status: None   Collection Time    12/13/13  6:49 AM      Result Value Ref Range Status   Specimen Description SPUTUM   Final   Special Requests NONE   Final   Gram Stain     Final   Value: MODERATE WBC PRESENT,BOTH PMN AND MONONUCLEAR     FEW SQUAMOUS EPITHELIAL CELLS PRESENT     FEW GRAM POSITIVE COCCI     IN PAIRS IN CHAINS RARE GRAM NEGATIVE RODS     Performed at [facility]   Culture     Final   Value: NORMAL OROPHARYNGEAL FLORA     Performed at [facility]   Report Status PENDING   Incomplete     Studies: Dg Chest 2 View  12/12/2013   CLINICAL DATA:  Chest pain, shortness of breath, rectal bleeding  EXAM: CHEST  2 VIEW  COMPARISON:  12/04/2009  FINDINGS: Enlargement of cardiac silhouette.  Tortuous aorta.  Mediastinal contours and pulmonary vascularity normal.  Peribronchial thickening with question subtle RIGHT perihilar infiltrate.  Minimal bibasilar atelectasis.  Upper lungs clear.  No pleural effusion or pneumothorax.  Bones demineralized.  IMPRESSION: Enlargement of cardiac silhouette.  Bronchitic changes with minimal bibasilar atelectasis and questionable RIGHT perihilar infiltrate.   Electronically Signed   By: Lavonia Dana M.D.   On: 12/12/2013 16:45    Scheduled Meds: . amiodarone  150 mg Oral Daily  . azithromycin  500 mg Oral Q24H  . cefUROXime  500 mg Oral BID WC  . insulin aspart  0-5 Units Subcutaneous QHS  . insulin aspart  0-9 Units Subcutaneous TID WC  . lisinopril  20 mg Oral BID  . methimazole  5 mg Oral Daily  . simvastatin  5 mg Oral q1800   Continuous Infusions:   Time spent: 25 min  Geradine Girt, DO  [facility] Pager [phone]. If 7PM-7AM, please contact night-coverage at [email or web address], password [facility] 12/14/2013, 10:23 AM  LOS: 2 days

## 2013-12-14 NOTE — Progress Notes (Signed)
ANTICOAGULATION CONSULT NOTE - Initial Consult  Pharmacy Consult for Coumadin Indication: atrial fibrillation  Allergies  Allergen Reactions  . Amoxicillin Other (See Comments)    tongue felt bruised.  . Celebrex [Celecoxib] Other (See Comments)    Mouth sores    Patient Measurements: Height: 5' 0.5" (153.7 cm) Weight: 128 lb 11.2 oz (58.378 kg) IBW/kg (Calculated) : 46.65 Heparin Dosing Weight:   Vital Signs: Temp: 98.1 F (36.7 C) (05/13 0500) Temp src: Oral (05/13 0500) BP: 126/51 mmHg (05/13 0500) Pulse Rate: 45 (05/13 0500)  Labs:  Recent Labs  12/12/13 1549 12/13/13 0946 12/14/13 1030  HGB 12.0 11.4*  --   HCT 35.0* 34.4*  --   PLT 236  --   --   LABPROT 27.4* 25.4* 21.3*  INR 2.66* 2.40* 1.91*  CREATININE 1.07  --   --     Estimated Creatinine Clearance: 28.4 ml/min (by C-G formula based on Cr of 1.07).   Medical History: Past Medical History  Diagnosis Date  . Mediastinal goiter     removed 1988  . CKD (chronic kidney disease)     stage III  . Osteopenia   . Osteoporosis   . Vitamin D deficiency   . Paroxysmal atrial fibrillation   . Hypertension   . LBBB (left bundle branch block)   . Aortic valve sclerosis   . Hx of orthostatic hypotension   . Colon, diverticulosis   . Tachycardia-bradycardia   . Diabetes mellitus     diet controlled  . Esophageal reflux   . Aortic stenosis, severe 04/2013  . GERD (gastroesophageal reflux disease)     Medications:  Prescriptions prior to admission  Medication Sig Dispense Refill  . acetaminophen (TYLENOL) 500 MG tablet Take 500 mg by mouth every 6 (six) hours as needed for moderate pain.      [name] amiodarone (PACERONE) 200 MG tablet Take 150 mg by mouth daily.      . Calcium Carb-Cholecalciferol (CALCIUM 600/VITAMIN D3 PO) Take 1 tablet by mouth 2 (two) times daily.      [name] guaiFENesin (MUCINEX) 600 MG 12 hr tablet Take 600 mg by mouth 2 (two) times daily as needed for cough.      [name] lisinopril  (PRINIVIL,ZESTRIL) 20 MG tablet Take 1 tablet (20 mg total) by mouth 2 (two) times daily.  60 tablet  5  . methimazole (TAPAZOLE) 10 MG tablet Take 5 mg by mouth daily.       . pravastatin (PRAVACHOL) 10 MG tablet Take 10 mg by mouth at bedtime.       [name] warfarin (COUMADIN) 2.5 MG tablet take 1 tablet by mouth once daily EXCEPT 1AND1/2 TABLET ON THURSDAY  45 tablet  5    Assessment: 89yof on Coumadin PTA for hx Afib. Coumadin was held on admission for GI bleed workup as patient reported 1 episode of hematochezia. No additional hematochezia or significant bleeding since admission, FOB reported as negative - OK per TR[facility] to resume Coumadin. INR has decreased to 1.91 due to 2 held doses (INR 2.66 on admission). Patient's PTA regimen was 2.5mg  daily except 3.75mg  on Thursdays - will give an increased dose tonight with decreasing INR and recent held doses. - Hg 11.4, Ptls wnl - CBC ordered for tomorrow AM - No significant bleeding reported  Goal of Therapy:  INR 2-3 Monitor platelets by anticoagulation protocol: Yes   Plan:  1. Coumadin 4mg  po x 1 today 2. Daily INR 3. Follow-up AM CBC and s/sx of  bleeding  Patsey Berthold [facility] [id number] 12/14/2013,12:36 PM

## 2013-12-15 DIAGNOSIS — I10 Essential (primary) hypertension: Secondary | ICD-10-CM | POA: Diagnosis not present

## 2013-12-15 DIAGNOSIS — I498 Other specified cardiac arrhythmias: Secondary | ICD-10-CM | POA: Diagnosis not present

## 2013-12-15 DIAGNOSIS — I359 Nonrheumatic aortic valve disorder, unspecified: Secondary | ICD-10-CM | POA: Diagnosis not present

## 2013-12-15 DIAGNOSIS — J189 Pneumonia, unspecified organism: Secondary | ICD-10-CM | POA: Diagnosis not present

## 2013-12-15 LAB — CULTURE, RESPIRATORY

## 2013-12-15 LAB — CBC
HCT: 33.4 % — ABNORMAL LOW (ref 36.0–46.0)
Hemoglobin: 11.4 g/dL — ABNORMAL LOW (ref 12.0–15.0)
MCH: 31.3 pg (ref 26.0–34.0)
MCHC: 34.1 g/dL (ref 30.0–36.0)
MCV: 91.8 fL (ref 78.0–100.0)
Platelets: 250 10*3/uL (ref 150–400)
RBC: 3.64 MIL/uL — ABNORMAL LOW (ref 3.87–5.11)
RDW: 14 % (ref 11.5–15.5)
WBC: 6.4 10*3/uL (ref 4.0–10.5)

## 2013-12-15 LAB — CULTURE, RESPIRATORY W GRAM STAIN: Culture: NORMAL

## 2013-12-15 LAB — BASIC METABOLIC PANEL
BUN: 19 mg/dL (ref 6–23)
CO2: 25 mEq/L (ref 19–32)
Calcium: 8.5 mg/dL (ref 8.4–10.5)
Chloride: 105 mEq/L (ref 96–112)
Creatinine, Ser: 1.12 mg/dL — ABNORMAL HIGH (ref 0.50–1.10)
GFR calc Af Amer: 49 mL/min — ABNORMAL LOW (ref >90)
GFR calc non Af Amer: 42 mL/min — ABNORMAL LOW (ref >90)
Glucose, Bld: 92 mg/dL (ref 70–99)
Potassium: 3.9 mEq/L (ref 3.7–5.3)
Sodium: 140 mEq/L (ref 137–147)

## 2013-12-15 LAB — PROTIME-INR
INR: 1.71 — ABNORMAL HIGH (ref 0.00–1.49)
Prothrombin Time: 19.6 seconds — ABNORMAL HIGH (ref 11.6–15.2)

## 2013-12-15 MED ORDER — AZITHROMYCIN 500 MG PO TABS: 500.0000 mg | ORAL_TABLET | ORAL | Status: DC

## 2013-12-15 MED ORDER — CEFUROXIME AXETIL 500 MG PO TABS: 500.0000 mg | ORAL_TABLET | Freq: Two times a day (BID) | ORAL | Status: DC

## 2013-12-15 NOTE — Discharge Summary (Signed)
Physician Discharge Summary  Alexa Mcdonald [id number] DOB: 08-Sep-1920 DOA: 12/12/2013  PCP: [name], MD  Admit date: 12/12/2013 Discharge date: 12/15/2013  Time spent:  35 min  Recommendations for Outpatient Follow-up:  Referral to GI if continued bleeding Bowel regemen for soft BMs PT/INR Monday CBC, BMP 1 week  Discharge Diagnoses:  Principal Problem:   CAP (community acquired pneumonia) Active Problems:   Atrial fibrillation   Aortic stenosis, severe   Hematochezia   CKD (chronic kidney disease), stage III   Hyperthyroidism   GERD (gastroesophageal reflux disease)   Long term (current) use of anticoagulants   Elevated brain natriuretic peptide (BNP) level   Sinus bradycardia   Pneumonia   Discharge Condition: improved  Diet recommendation: cardiac  Filed Weights   12/12/13 2046  Weight: 58.378 kg (128 lb 11.2 oz)    History of present illness:  80 y.o. year old female with prior history of atrophic fibrillation on Coumadin, stage III chronic kidney disease, hypertension, Diet-controlled diabetes presenting with dyspnea and cough. Patient states that she's had progressive cough and shortness of breath since earlier today. Says the cough has been nonproductive. Has had some associated pleuritic chest pain is worse with coughing. Denies any fevers chills. No known sick contacts. Does have some mild shortness of breath this seems to be worse with coughing. No nausea or vomiting. Denies any lower extremity swelling. States she had one episode of bright red blood per rectum today. Noticed some clots in the toilet. Denies any recent history of this. Presented to her PCP with symptoms earlier today and was directed to the ER.  On presentation, patient hemodynamically stable temperature 99.2. Satting 94-100% on room air. White blood cell count 6.1. Hemoglobin 12. Creatinine 1.07. INR of 2.6. chest x-ray shows enlarged cardiac silhouette and bronchitic changes with  questionable right perihilar infiltrate. Pro BNP and hemoccult are pending.    Hospital Course:  CAP (community acquired pneumonia): Change to PO abx for 8 day course Nebs, robitussin   Atrial fibrillation: Currently in sinus   Aortic stenosis, severe   Hematochezia: None further. Coumadin resumed; defer further wok up to outpatient, bowel regemine   CKD (chronic kidney disease), stage III   Hyperthyroidism   GERD (gastroesophageal reflux disease)   Long term (current) use of anticoagulants   Elevated brain natriuretic peptide (BNP) level: Clinically without evidence of congestive heart failure. EF normal last echo   Sinus bradycardia   Procedures:  none  Consultations:  none  Discharge Exam: Filed Vitals:   12/15/13 0553  BP: 137/54  Pulse: 43  Temp: 97.9 F (36.6 C)  Resp: 16    General: A+Ox3, NAD- feeling better Cardiovascular: rrr Respiratory: no wheezing  Discharge Instructions You were cared for by a hospitalist during your hospital stay. If you have any questions about your discharge medications or the care you received while you were in the hospital after you are discharged, you can call the unit and asked to speak with the hospitalist on call if the hospitalist that took care of you is not available. Once you are discharged, your primary care physician will handle any further medical issues. Please note that NO REFILLS for any discharge medications will be authorized once you are discharged, as it is imperative that you return to your primary care physician (or establish a relationship with a primary care physician if you do not have one) for your aftercare needs so that they can reassess your need for medications and monitor your  lab values.      Discharge Orders   Future Appointments Provider Department Dept Phone   12/16/2013 3:30 PM [facility] [place] Office [phone]   Future Orders Complete By Expires   Diet -  low sodium heart healthy  As directed    Discharge instructions  As directed    Increase activity slowly  As directed        Medication List         acetaminophen 500 MG tablet  Commonly known as:  TYLENOL  Take 500 mg by mouth every 6 (six) hours as needed for moderate pain.     amiodarone 200 MG tablet  Commonly known as:  PACERONE  Take 150 mg by mouth daily.     azithromycin 500 MG tablet  Commonly known as:  ZITHROMAX  Take 1 tablet (500 mg total) by mouth daily.     CALCIUM 600/VITAMIN D3 PO  Take 1 tablet by mouth 2 (two) times daily.     cefUROXime 500 MG tablet  Commonly known as:  CEFTIN  Take 1 tablet (500 mg total) by mouth 2 (two) times daily with a meal.     guaiFENesin 600 MG 12 hr tablet  Commonly known as:  MUCINEX  Take 600 mg by mouth 2 (two) times daily as needed for cough.     lisinopril 20 MG tablet  Commonly known as:  PRINIVIL,ZESTRIL  Take 1 tablet (20 mg total) by mouth 2 (two) times daily.     methimazole 10 MG tablet  Commonly known as:  TAPAZOLE  Take 5 mg by mouth daily.     pravastatin 10 MG tablet  Commonly known as:  PRAVACHOL  Take 10 mg by mouth at bedtime.     warfarin 2.5 MG tablet  Commonly known as:  COUMADIN  take 1 tablet by mouth once daily EXCEPT 1AND1/2 TABLET ON THURSDAY       Allergies  Allergen Reactions  . Amoxicillin Other (See Comments)    tongue felt bruised.  . Celebrex [Celecoxib] Other (See Comments)    Mouth sores   Follow-up Information   Follow up with [name], MD In 1 week.   Specialty:  Family Medicine   Contact information:   [address] [place] [address] [phone]       Please follow up. (coumadin clinic Monday- left message for Mr smart to reschedule your appointment, have not heard back)        The results of significant diagnostics from this hospitalization (including imaging, microbiology, ancillary and laboratory) are listed below for reference.     Significant Diagnostic Studies: Dg Chest 2 View  12/12/2013   CLINICAL DATA:  Chest pain, shortness of breath, rectal bleeding  EXAM: CHEST  2 VIEW  COMPARISON:  12/04/2009  FINDINGS: Enlargement of cardiac silhouette.  Tortuous aorta.  Mediastinal contours and pulmonary vascularity normal.  Peribronchial thickening with question subtle RIGHT perihilar infiltrate.  Minimal bibasilar atelectasis.  Upper lungs clear.  No pleural effusion or pneumothorax.  Bones demineralized.  IMPRESSION: Enlargement of cardiac silhouette.  Bronchitic changes with minimal bibasilar atelectasis and questionable RIGHT perihilar infiltrate.   Electronically Signed   By: Lavonia Dana M.D.   On: 12/12/2013 16:45    Microbiology: Recent Results (from the past 240 hour(s))  CULTURE, BLOOD (ROUTINE X 2)     Status: None   Collection Time    12/12/13  9:35 PM  Result Value Ref Range Status   Specimen Description BLOOD LEFT ARM   Final   Special Requests BOTTLES DRAWN AEROBIC ONLY 5CC   Final   Culture  Setup Time     Final   Value: 12/13/2013 04:10     Performed at [facility]   Culture     Final   Value:        BLOOD CULTURE RECEIVED NO GROWTH TO DATE CULTURE WILL BE HELD FOR 5 DAYS BEFORE ISSUING A FINAL NEGATIVE REPORT     Performed at [facility]   Report Status PENDING   Incomplete  CULTURE, BLOOD (ROUTINE X 2)     Status: None   Collection Time    12/12/13  9:39 PM      Result Value Ref Range Status   Specimen Description BLOOD RIGHT ARM   Final   Special Requests BOTTLES DRAWN AEROBIC ONLY 5CC   Final   Culture  Setup Time     Final   Value: 12/13/2013 04:10     Performed at [facility]   Culture     Final   Value:        BLOOD CULTURE RECEIVED NO GROWTH TO DATE CULTURE WILL BE HELD FOR 5 DAYS BEFORE ISSUING A FINAL NEGATIVE REPORT     Performed at [facility]   Report Status PENDING   Incomplete  CULTURE, EXPECTORATED SPUTUM-ASSESSMENT     Status: None    Collection Time    12/13/13  6:49 AM      Result Value Ref Range Status   Specimen Description SPUTUM   Final   Special Requests NONE   Final   Sputum evaluation     Final   Value: THIS SPECIMEN IS ACCEPTABLE. RESPIRATORY CULTURE REPORT TO FOLLOW.   Report Status 12/13/2013 FINAL   Final  CULTURE, RESPIRATORY (NON-EXPECTORATED)     Status: None   Collection Time    12/13/13  6:49 AM      Result Value Ref Range Status   Specimen Description SPUTUM   Final   Special Requests NONE   Final   Gram Stain     Final   Value: MODERATE WBC PRESENT,BOTH PMN AND MONONUCLEAR     FEW SQUAMOUS EPITHELIAL CELLS PRESENT     FEW GRAM POSITIVE COCCI     IN PAIRS IN CHAINS RARE GRAM NEGATIVE RODS     Performed at [facility]   Culture     Final   Value: NORMAL OROPHARYNGEAL FLORA     Performed at [facility]   Report Status 12/15/2013 FINAL   Final     Labs: Basic Metabolic Panel:  Recent Labs Lab 12/12/13 1549 12/15/13 0355  NA 140 140  K 4.6 3.9  CL 102 105  CO2 24 25  GLUCOSE 115* 92  BUN 20 19  CREATININE 1.07 1.12*  CALCIUM 8.4 8.5   Liver Function Tests: No results found for this basename: AST, ALT, ALKPHOS, BILITOT, PROT, ALBUMIN,  in the last 168 hours No results found for this basename: LIPASE, AMYLASE,  in the last 168 hours No results found for this basename: AMMONIA,  in the last 168 hours CBC:  Recent Labs Lab 12/12/13 1549 12/13/13 0946 12/15/13 0355  WBC 6.1  --  6.4  HGB 12.0 11.4* 11.4*  HCT 35.0* 34.4* 33.4*  MCV 92.1  --  91.8  PLT 236  --  250   Cardiac Enzymes: No  results found for this basename: CKTOTAL, CKMB, CKMBINDEX, TROPONINI,  in the last 168 hours BNP: BNP (last 3 results)  Recent Labs  12/12/13 2135  PROBNP 3333.0*   CBG:  Recent Labs Lab 12/13/13 1645 12/13/13 2049 12/14/13 0714 12/14/13 1522 12/14/13 2056  GLUCAP 128* 177* 100* 112* 113*       Signed:  Geradine Girt  [facility] 12/15/2013,  10:25 AM

## 2013-12-15 NOTE — Progress Notes (Signed)
Discharged to home with family office visits in place teaching done Discharged to home with family office visits in place teaching done  

## 2013-12-17 NOTE — ED Provider Notes (Signed)
CSN: [id number]     Arrival date & time 12/12/13  1533 History   First MD Initiated Contact with Patient 12/12/13 1606     Chief Complaint  Patient presents with  . Chest Pain  . Shortness of Breath  . Rectal Bleeding     (Consider location/radiation/quality/duration/timing/severity/associated sxs/prior Treatment) HPI Patient presents to the emergency department with chest pain, and shortness of breath, that has been ongoing since Friday.  Patient, states, that she's had some swallowed blood clots noted in her stool.  Patient, states, that she's also had cough and wheezing.  Patient, states she was seen by her primary care doctor, who sent her in for evaluation for admission.  Patient denies nausea, vomiting, abdominal pain, headache, blurred vision, weakness, dizziness, back pain, fever, or syncope patient states she did not take any medications prior to arrival. Past Medical History  Diagnosis Date  . Mediastinal goiter     removed 1988  . CKD (chronic kidney disease)     stage III  . Osteopenia   . Osteoporosis   . Vitamin D deficiency   . Paroxysmal atrial fibrillation   . Hypertension   . LBBB (left bundle branch block)   . Aortic valve sclerosis   . Hx of orthostatic hypotension   . Colon, diverticulosis   . Tachycardia-bradycardia   . Diabetes mellitus     diet controlled  . Esophageal reflux   . Aortic stenosis, severe 04/2013  . GERD (gastroesophageal reflux disease)    Past Surgical History  Procedure Laterality Date  . Cataract extraction    . Carpal tunnel release    . Mri  08/18/07    head (diabetes heart study at [facility])  . Mediastinal removal of a goiter     Family History  Problem Relation Age of Onset  . Diabetes Mother   . Cancer - Prostate Father   . Pulmonary embolism Father   . Hypertension Sister   . Diabetes Sister   . Hypertension Brother   . Arrhythmia Brother     afib  . CAD Brother   . Pulmonary fibrosis Brother    History  Substance Use  Topics  . Smoking status: Never Smoker   . Smokeless tobacco: Not on file  . Alcohol Use: No   OB History   Grav Para Term Preterm Abortions TAB SAB Ect Mult Living                 Review of Systems  All other systems negative except as documented in the HPI. All pertinent positives and negatives as reviewed in the HPI.  Allergies  Amoxicillin and Celebrex  Home Medications   Prior to Admission medications   Medication Sig Start Date End Date Taking? Authorizing Provider  acetaminophen (TYLENOL) 500 MG tablet Take 500 mg by mouth every 6 (six) hours as needed for moderate pain.   Yes Historical Provider, MD  amiodarone (PACERONE) 200 MG tablet Take 150 mg by mouth daily.   Yes Historical Provider, MD  Calcium Carb-Cholecalciferol (CALCIUM 600/VITAMIN D3 PO) Take 1 tablet by mouth 2 (two) times daily.   Yes Historical Provider, MD  guaiFENesin (MUCINEX) 600 MG 12 hr tablet Take 600 mg by mouth 2 (two) times daily as needed for cough.   Yes Historical Provider, MD  lisinopril (PRINIVIL,ZESTRIL) 20 MG tablet Take 1 tablet (20 mg total) by mouth 2 (two) times daily. 06/08/13  Yes Sueanne Margarita, MD  methimazole (TAPAZOLE) 10 MG tablet Take 5 mg  by mouth daily.    Yes Historical Provider, MD  pravastatin (PRAVACHOL) 10 MG tablet Take 10 mg by mouth at bedtime.    Yes Historical Provider, MD  warfarin (COUMADIN) 2.5 MG tablet take 1 tablet by mouth once daily EXCEPT 1AND1/2 TABLET ON THURSDAY 07/04/13  Yes Sueanne Margarita, MD  azithromycin (ZITHROMAX) 500 MG tablet Take 1 tablet (500 mg total) by mouth daily. 12/15/13   Geradine Girt, DO  cefUROXime (CEFTIN) 500 MG tablet Take 1 tablet (500 mg total) by mouth 2 (two) times daily with a meal. 12/15/13   Geradine Girt, DO   BP 137/54  Pulse 43  Temp(Src) 97.9 F (36.6 C) (Oral)  Resp 16  Ht 5' 0.5" (1.537 m)  Wt 128 lb 11.2 oz (58.378 kg)  BMI 24.71 kg/m2  SpO2 99% Physical Exam  Nursing note and vitals reviewed. Constitutional: She  is oriented to person, place, and time. She appears well-developed and well-nourished. No distress.  HENT:  Head: Normocephalic and atraumatic.  Mouth/Throat: Oropharynx is clear and moist.  Eyes: Pupils are equal, round, and reactive to light.  Neck: Normal range of motion. Neck supple.  Cardiovascular: Normal rate, regular rhythm and normal heart sounds.  Exam reveals no gallop and no friction rub.   No murmur heard. Pulmonary/Chest: No accessory muscle usage. Not tachypneic. No respiratory distress. She has no decreased breath sounds. She has wheezes. She has rhonchi in the right upper field and the right middle field. She has no rales.  Neurological: She is alert and oriented to person, place, and time. She exhibits normal muscle tone. Coordination normal.  Skin: Skin is warm and dry. No rash noted. No erythema.    ED Course  Procedures (including critical care time) Labs Review Labs Reviewed  CBC - Abnormal; Notable for the following:    RBC 3.80 (*)    HCT 35.0 (*)    All other components within normal limits  BASIC METABOLIC PANEL - Abnormal; Notable for the following:    Glucose, Bld 115 (*)    GFR calc non Af Amer 44 (*)    GFR calc Af Amer 51 (*)    All other components within normal limits  PRO B NATRIURETIC PEPTIDE - Abnormal; Notable for the following:    Pro B Natriuretic peptide (BNP) 3333.0 (*)    All other components within normal limits  PROTIME-INR - Abnormal; Notable for the following:    Prothrombin Time 27.4 (*)    INR 2.66 (*)    All other components within normal limits  HEMOGLOBIN A1C - Abnormal; Notable for the following:    Hemoglobin A1C 6.2 (*)    Mean Plasma Glucose 131 (*)    All other components within normal limits  GLUCOSE, CAPILLARY - Abnormal; Notable for the following:    Glucose-Capillary 131 (*)    All other components within normal limits  GLUCOSE, CAPILLARY - Abnormal; Notable for the following:    Glucose-Capillary 107 (*)    All  other components within normal limits  HEMOGLOBIN AND HEMATOCRIT, BLOOD - Abnormal; Notable for the following:    Hemoglobin 11.4 (*)    HCT 34.4 (*)    All other components within normal limits  PROTIME-INR - Abnormal; Notable for the following:    Prothrombin Time 25.4 (*)    INR 2.40 (*)    All other components within normal limits  GLUCOSE, CAPILLARY - Abnormal; Notable for the following:    Glucose-Capillary 128 (*)  All other components within normal limits  GLUCOSE, CAPILLARY - Abnormal; Notable for the following:    Glucose-Capillary 177 (*)    All other components within normal limits  GLUCOSE, CAPILLARY - Abnormal; Notable for the following:    Glucose-Capillary 100 (*)    All other components within normal limits  PROTIME-INR - Abnormal; Notable for the following:    Prothrombin Time 21.3 (*)    INR 1.91 (*)    All other components within normal limits  GLUCOSE, CAPILLARY - Abnormal; Notable for the following:    Glucose-Capillary 112 (*)    All other components within normal limits  CBC - Abnormal; Notable for the following:    RBC 3.64 (*)    Hemoglobin 11.4 (*)    HCT 33.4 (*)    All other components within normal limits  BASIC METABOLIC PANEL - Abnormal; Notable for the following:    Creatinine, Ser 1.12 (*)    GFR calc non Af Amer 42 (*)    GFR calc Af Amer 49 (*)    All other components within normal limits  PROTIME-INR - Abnormal; Notable for the following:    Prothrombin Time 19.6 (*)    INR 1.71 (*)    All other components within normal limits  GLUCOSE, CAPILLARY - Abnormal; Notable for the following:    Glucose-Capillary 113 (*)    All other components within normal limits  CULTURE, BLOOD (ROUTINE X 2)  CULTURE, BLOOD (ROUTINE X 2)  CULTURE, EXPECTORATED SPUTUM-ASSESSMENT  CULTURE, RESPIRATORY (NON-EXPECTORATED)  HIV ANTIBODY (ROUTINE TESTING)  LEGIONELLA ANTIGEN, URINE  STREP PNEUMONIAE URINARY ANTIGEN  OCCULT BLOOD X 1 CARD TO LAB, STOOL   I-STAT TROPOININ, ED  POC OCCULT BLOOD, ED    Imaging Review No results found.   EKG Interpretation   Date/Time:  Monday Dec 12 2013 15:47:41 EDT Ventricular Rate:  55 PR Interval:  196 QRS Duration: 150 QT Interval:  528 QTC Calculation: 505 R Axis:   -33 Text Interpretation:  Sinus bradycardia Left axis deviation Left bundle  branch block Abnormal ECG ED PHYSICIAN INTERPRETATION AVAILABLE IN [facility] Confirmed by TEST, Record ([id number]) on 12/14/2013 6:53:10 AM      MDM     Patient be admitted to the hospital for further evaluation and care.  I spoke with the [facility], for admission.  Brent General, PA-C 12/17/13 [phone]

## 2013-12-17 NOTE — ED Provider Notes (Signed)
  This was a shared visit with a mid-level provided (NP or PA).  Throughout the patient's course I was available for consultation/collaboration.  I saw the ECG (if appropriate), relevant labs and studies - I agree with the interpretation.  On my exam the patient was in no distress.  Patient has no anemia, requiring transfusion admission for further evaluation and management        Carmin Muskrat, MD 12/17/13 1722

## 2013-12-19 ENCOUNTER — Ambulatory Visit (INDEPENDENT_AMBULATORY_CARE_PROVIDER_SITE_OTHER): Payer: Medicare Other | Admitting: Pharmacist

## 2013-12-19 DIAGNOSIS — I4891 Unspecified atrial fibrillation: Secondary | ICD-10-CM

## 2013-12-19 DIAGNOSIS — L570 Actinic keratosis: Secondary | ICD-10-CM | POA: Diagnosis not present

## 2013-12-19 DIAGNOSIS — Z5181 Encounter for therapeutic drug level monitoring: Secondary | ICD-10-CM

## 2013-12-19 DIAGNOSIS — Z85828 Personal history of other malignant neoplasm of skin: Secondary | ICD-10-CM | POA: Diagnosis not present

## 2013-12-19 LAB — CULTURE, BLOOD (ROUTINE X 2)
Culture: NO GROWTH
Culture: NO GROWTH

## 2013-12-19 LAB — POCT INR: INR: 3.1

## 2013-12-23 DIAGNOSIS — K625 Hemorrhage of anus and rectum: Secondary | ICD-10-CM | POA: Diagnosis not present

## 2013-12-23 DIAGNOSIS — J189 Pneumonia, unspecified organism: Secondary | ICD-10-CM | POA: Diagnosis not present

## 2013-12-23 DIAGNOSIS — Z79899 Other long term (current) drug therapy: Secondary | ICD-10-CM | POA: Diagnosis not present

## 2013-12-23 DIAGNOSIS — I10 Essential (primary) hypertension: Secondary | ICD-10-CM | POA: Diagnosis not present

## 2013-12-23 DIAGNOSIS — I4891 Unspecified atrial fibrillation: Secondary | ICD-10-CM | POA: Diagnosis not present

## 2013-12-23 DIAGNOSIS — Z7901 Long term (current) use of anticoagulants: Secondary | ICD-10-CM | POA: Diagnosis not present

## 2013-12-27 ENCOUNTER — Ambulatory Visit (INDEPENDENT_AMBULATORY_CARE_PROVIDER_SITE_OTHER): Payer: Medicare Other | Admitting: Pharmacist

## 2013-12-27 DIAGNOSIS — Z5181 Encounter for therapeutic drug level monitoring: Secondary | ICD-10-CM

## 2013-12-27 DIAGNOSIS — I4891 Unspecified atrial fibrillation: Secondary | ICD-10-CM

## 2013-12-27 LAB — POCT INR: INR: 2.7

## 2013-12-29 ENCOUNTER — Other Ambulatory Visit: Payer: Self-pay

## 2013-12-29 MED ORDER — LISINOPRIL 20 MG PO TABS: 20.0000 mg | ORAL_TABLET | Freq: Two times a day (BID) | ORAL | Status: DC

## 2014-01-12 DIAGNOSIS — H18519 Endothelial corneal dystrophy, unspecified eye: Secondary | ICD-10-CM | POA: Diagnosis not present

## 2014-01-12 DIAGNOSIS — E119 Type 2 diabetes mellitus without complications: Secondary | ICD-10-CM | POA: Diagnosis not present

## 2014-01-13 ENCOUNTER — Ambulatory Visit (INDEPENDENT_AMBULATORY_CARE_PROVIDER_SITE_OTHER): Payer: Medicare Other | Admitting: *Deleted

## 2014-01-13 DIAGNOSIS — I4891 Unspecified atrial fibrillation: Secondary | ICD-10-CM | POA: Diagnosis not present

## 2014-01-13 DIAGNOSIS — Z5181 Encounter for therapeutic drug level monitoring: Secondary | ICD-10-CM | POA: Diagnosis not present

## 2014-01-13 LAB — POCT INR: INR: 2.9

## 2014-01-18 ENCOUNTER — Other Ambulatory Visit: Payer: Self-pay | Admitting: Dermatology

## 2014-01-18 DIAGNOSIS — C44721 Squamous cell carcinoma of skin of unspecified lower limb, including hip: Secondary | ICD-10-CM | POA: Diagnosis not present

## 2014-01-18 DIAGNOSIS — Z85828 Personal history of other malignant neoplasm of skin: Secondary | ICD-10-CM | POA: Diagnosis not present

## 2014-01-18 DIAGNOSIS — D485 Neoplasm of uncertain behavior of skin: Secondary | ICD-10-CM | POA: Diagnosis not present

## 2014-01-18 DIAGNOSIS — C44621 Squamous cell carcinoma of skin of unspecified upper limb, including shoulder: Secondary | ICD-10-CM | POA: Diagnosis not present

## 2014-01-25 ENCOUNTER — Other Ambulatory Visit: Payer: Self-pay | Admitting: Dermatology

## 2014-01-25 DIAGNOSIS — Z85828 Personal history of other malignant neoplasm of skin: Secondary | ICD-10-CM | POA: Diagnosis not present

## 2014-01-25 DIAGNOSIS — C44621 Squamous cell carcinoma of skin of unspecified upper limb, including shoulder: Secondary | ICD-10-CM | POA: Diagnosis not present

## 2014-01-30 ENCOUNTER — Other Ambulatory Visit: Payer: Self-pay | Admitting: Cardiology

## 2014-01-31 NOTE — Telephone Encounter (Signed)
Ok to refill like this, or should it be 100mg  tablets take 1.5 qd? Please advise. Thanks, MI

## 2014-02-06 ENCOUNTER — Ambulatory Visit (INDEPENDENT_AMBULATORY_CARE_PROVIDER_SITE_OTHER): Payer: Medicare Other

## 2014-02-06 ENCOUNTER — Encounter: Payer: Self-pay | Admitting: Cardiology

## 2014-02-06 ENCOUNTER — Ambulatory Visit (INDEPENDENT_AMBULATORY_CARE_PROVIDER_SITE_OTHER): Payer: Medicare Other | Admitting: Cardiology

## 2014-02-06 VITALS — BP 160/82 | HR 54 | Ht 61.5 in | Wt 130.8 lb

## 2014-02-06 DIAGNOSIS — I4891 Unspecified atrial fibrillation: Secondary | ICD-10-CM

## 2014-02-06 DIAGNOSIS — I498 Other specified cardiac arrhythmias: Secondary | ICD-10-CM | POA: Diagnosis not present

## 2014-02-06 DIAGNOSIS — I359 Nonrheumatic aortic valve disorder, unspecified: Secondary | ICD-10-CM | POA: Diagnosis not present

## 2014-02-06 DIAGNOSIS — I10 Essential (primary) hypertension: Secondary | ICD-10-CM

## 2014-02-06 DIAGNOSIS — R001 Bradycardia, unspecified: Secondary | ICD-10-CM

## 2014-02-06 DIAGNOSIS — I350 Nonrheumatic aortic (valve) stenosis: Secondary | ICD-10-CM

## 2014-02-06 DIAGNOSIS — Z5181 Encounter for therapeutic drug level monitoring: Secondary | ICD-10-CM

## 2014-02-06 DIAGNOSIS — I480 Paroxysmal atrial fibrillation: Secondary | ICD-10-CM

## 2014-02-06 LAB — POCT INR: INR: 3.2

## 2014-02-06 NOTE — Patient Instructions (Addendum)
Your physician recommends that you continue on your current medications as directed. Please refer to the Current Medication list given to you today.  Your physician recommends that you go to the lab today for a Hepatic panel  Your physician has requested that you have an echocardiogram. Echocardiography is a painless test that uses sound waves to create images of your heart. It provides your doctor with information about the size and shape of your heart and how well your heart's chambers and valves are working. This procedure takes approximately one hour. There are no restrictions for this procedure.  Your physician wants you to follow-up in: 6 months with Dr Mallie Snooks will receive a reminder letter in the mail two months in advance. If you don't receive a letter, please call our office to schedule the follow-up appointment.

## 2014-02-06 NOTE — Progress Notes (Signed)
[place], [address] [place], [place]  [address] Phone: [phone] Fax:  [phone]  Date:  02/06/2014   ID:  Alexa Mcdonald, DOB 01-01-1921, MRN [id number]  PCP:  [name], MD  Cardiologist:  Fransico Him, MD     History of Present Illness: Alexa Mcdonald is a 80 y.o. female with a history of severe AS medically managed, HTN, PAF on chronic anticoagulation and chronic LE edema. She is doing well. She denies any chest pain, SOB, DOE, dizziness, palpitations or syncope. She has chronic LE edema which has been controlled until this past weekend when they swelled up.  She attributes this to having company this weekend and being on her feet a lot. She is still quite active and is able to do her daily activities and shop without any SOB or chest pain.     Wt Readings from Last 3 Encounters:  02/06/14 130 lb 12.8 oz (59.33 kg)  12/12/13 128 lb 11.2 oz (58.378 kg)  08/29/13 130 lb 6.4 oz (59.149 kg)     Past Medical History  Diagnosis Date  . Mediastinal goiter     removed 1988  . CKD (chronic kidney disease)     stage III  . Osteopenia   . Osteoporosis   . Vitamin D deficiency   . Paroxysmal atrial fibrillation   . Hypertension   . LBBB (left bundle branch block)   . Aortic valve sclerosis   . Hx of orthostatic hypotension   . Colon, diverticulosis   . Tachycardia-bradycardia   . Diabetes mellitus     diet controlled  . Esophageal reflux   . Aortic stenosis, severe 04/2013  . GERD (gastroesophageal reflux disease)     Current Outpatient Prescriptions  Medication Sig Dispense Refill  . acetaminophen (TYLENOL) 500 MG tablet Take 500 mg by mouth every 6 (six) hours as needed for moderate pain.      [name] amiodarone (PACERONE) 200 MG tablet Take 3/4 of a tablet by mouth once daily      . Calcium Carb-Cholecalciferol (CALCIUM 600/VITAMIN D3 PO) Take 1 tablet by mouth 2 (two) times daily.      [name] lisinopril (PRINIVIL,ZESTRIL) 20 MG tablet Take 1 tablet (20 mg  total) by mouth 2 (two) times daily.  60 tablet  5  . methimazole (TAPAZOLE) 10 MG tablet Take 5 mg by mouth daily.       . pravastatin (PRAVACHOL) 10 MG tablet Take 10 mg by mouth at bedtime.       [name] warfarin (COUMADIN) 2.5 MG tablet take 1 tablet by mouth once daily EXCEPT 1AND1/2 TABLET ON THURSDAY  45 tablet  5   No current facility-administered medications for this visit.    Allergies:    Allergies  Allergen Reactions  . Amoxicillin Other (See Comments)    tongue felt bruised.  . Celebrex [Celecoxib] Other (See Comments)    Mouth sores    Social History:  The patient  reports that she has never smoked. She does not have any smokeless tobacco history on file. She reports that she does not drink alcohol or use illicit drugs.   Family History:  The patient's family history includes Arrhythmia in her brother; CAD in her brother; Cancer - Prostate in her father; Diabetes in her mother and sister; Hypertension in her brother and sister; Pulmonary embolism in her father; Pulmonary fibrosis in her brother.   ROS:  Please see the history of present illness.  All other systems reviewed and negative.   PHYSICAL EXAM: VS:  BP 160/82  Pulse 54  Ht 5' 1.5" (1.562 m)  Wt 130 lb 12.8 oz (59.33 kg)  BMI 24.32 kg/m2 Well nourished, well developed, in no acute distress HEENT: normal Neck: no JVD Cardiac:  normal S1, S2; RRR; no murmur Lungs:  clear to auscultation bilaterally, no wheezing, rhonchi or rales Abd: soft, nontender, no hepatomegaly Ext: no edema Skin: warm and dry Neuro:  CNs 2-12 intact, no focal abnormalities noted     ASSESSMENT AND PLAN:  1. Severe AS - she is completely asymptomatic - repeat echo 2. HTN- elevated today but she has been rushing around.  At home her BP runs 116-171/57-41mmHg.  Most of them are under 140'/60's.  I will keep her on her current medical regimen.  I am leary to increase her BP meds further due to severe AS. - continue Lisinopril   3. Chronic LE edema - controlled on no diuretic 4. PAF- maintaining NSR on Amio - continue amio and warfarin  - check LFTs on amio 5. Chronic systemic anticoagulation  Followup with me in 6 months   Signed, Fransico Him, MD 02/06/2014 3:58 PM

## 2014-02-07 ENCOUNTER — Encounter: Payer: Self-pay | Admitting: General Surgery

## 2014-02-07 LAB — HEPATIC FUNCTION PANEL
ALT: 13 U/L (ref 0–35)
AST: 17 U/L (ref 0–37)
Albumin: 3.7 g/dL (ref 3.5–5.2)
Alkaline Phosphatase: 56 U/L (ref 39–117)
Bilirubin, Direct: 0.1 mg/dL (ref 0.0–0.3)
Total Bilirubin: 0.5 mg/dL (ref 0.2–1.2)
Total Protein: 6.6 g/dL (ref 6.0–8.3)

## 2014-02-09 ENCOUNTER — Ambulatory Visit (HOSPITAL_COMMUNITY): Payer: Medicare Other | Attending: Cardiology | Admitting: Radiology

## 2014-02-09 DIAGNOSIS — I4891 Unspecified atrial fibrillation: Secondary | ICD-10-CM | POA: Insufficient documentation

## 2014-02-09 DIAGNOSIS — I350 Nonrheumatic aortic (valve) stenosis: Secondary | ICD-10-CM

## 2014-02-09 DIAGNOSIS — I379 Nonrheumatic pulmonary valve disorder, unspecified: Secondary | ICD-10-CM | POA: Insufficient documentation

## 2014-02-09 DIAGNOSIS — I447 Left bundle-branch block, unspecified: Secondary | ICD-10-CM | POA: Diagnosis not present

## 2014-02-09 DIAGNOSIS — I10 Essential (primary) hypertension: Secondary | ICD-10-CM | POA: Insufficient documentation

## 2014-02-09 DIAGNOSIS — E119 Type 2 diabetes mellitus without complications: Secondary | ICD-10-CM | POA: Diagnosis not present

## 2014-02-09 DIAGNOSIS — I495 Sick sinus syndrome: Secondary | ICD-10-CM | POA: Insufficient documentation

## 2014-02-09 DIAGNOSIS — R609 Edema, unspecified: Secondary | ICD-10-CM | POA: Insufficient documentation

## 2014-02-09 DIAGNOSIS — I359 Nonrheumatic aortic valve disorder, unspecified: Secondary | ICD-10-CM

## 2014-02-09 NOTE — Progress Notes (Signed)
Echocardiogram performed.  

## 2014-02-20 DIAGNOSIS — E0520 Thyrotoxicosis with toxic multinodular goiter without thyrotoxic crisis or storm: Secondary | ICD-10-CM | POA: Diagnosis not present

## 2014-02-21 ENCOUNTER — Ambulatory Visit (INDEPENDENT_AMBULATORY_CARE_PROVIDER_SITE_OTHER): Payer: Medicare Other | Admitting: Cardiology

## 2014-02-21 VITALS — BP 178/62 | HR 54 | Ht 60.0 in | Wt 128.0 lb

## 2014-02-21 DIAGNOSIS — I359 Nonrheumatic aortic valve disorder, unspecified: Secondary | ICD-10-CM

## 2014-02-21 DIAGNOSIS — I350 Nonrheumatic aortic (valve) stenosis: Secondary | ICD-10-CM

## 2014-02-21 DIAGNOSIS — R609 Edema, unspecified: Secondary | ICD-10-CM

## 2014-02-21 DIAGNOSIS — I4891 Unspecified atrial fibrillation: Secondary | ICD-10-CM

## 2014-02-21 DIAGNOSIS — I10 Essential (primary) hypertension: Secondary | ICD-10-CM

## 2014-02-21 DIAGNOSIS — R600 Localized edema: Secondary | ICD-10-CM

## 2014-02-21 DIAGNOSIS — I480 Paroxysmal atrial fibrillation: Secondary | ICD-10-CM

## 2014-02-21 NOTE — Progress Notes (Signed)
[place], [place], [place]  [address] Phone: [phone] Fax:  [phone]  Date:  02/21/2014   ID:  Alexa Mcdonald, DOB [birth year], MRN [id number]  PCP:  [name], MD  Cardiologist:  Fransico Him, MD     History of Present Illness: Alexa Mcdonald is a 80 y.o. female with a history of severe AS medically managed, HTN, PAF on chronic anticoagulation and chronic LE edema. She is doing well. She denies any chest pain, SOB, DOE, dizziness, palpitations or syncope. She has chronic LE edema which has been controlled until this past weekend when they swelled up. She attributes this to having company this weekend and being on her feet a lot. She is still quite active and is able to do her daily activities and shop without any SOB or chest pain. She recently had an echo which showed progression of her AS with a peak AVG of 154mmHg and mean AVG 37mmHg.  She remains asymptomatic.  She is still active at home doing her daily activities and goes out shopping.      Wt Readings from Last 3 Encounters:  02/21/14 128 lb (58.06 kg)  02/06/14 130 lb 12.8 oz (59.33 kg)  12/12/13 128 lb 11.2 oz (58.378 kg)     Past Medical History  Diagnosis Date  . Mediastinal goiter     removed 1988  . CKD (chronic kidney disease)     stage III  . Osteopenia   . Osteoporosis   . Vitamin D deficiency   . Paroxysmal atrial fibrillation   . Hypertension   . LBBB (left bundle branch block)   . Aortic valve sclerosis   . Hx of orthostatic hypotension   . Colon, diverticulosis   . Tachycardia-bradycardia   . Diabetes mellitus     diet controlled  . Esophageal reflux   . Aortic stenosis, severe 04/2013  . GERD (gastroesophageal reflux disease)     Current Outpatient Prescriptions  Medication Sig Dispense Refill  . acetaminophen (TYLENOL) 500 MG tablet Take 500 mg by mouth every 6 (six) hours as needed for moderate pain.      [name] amiodarone (PACERONE) 200 MG tablet Take 3/4 of a  tablet by mouth once daily      . Calcium Carb-Cholecalciferol (CALCIUM 600/VITAMIN D3 PO) Take 1 tablet by mouth 2 (two) times daily.      [name] lisinopril (PRINIVIL,ZESTRIL) 20 MG tablet Take 1 tablet (20 mg total) by mouth 2 (two) times daily.  60 tablet  5  . methimazole (TAPAZOLE) 10 MG tablet Take 5 mg by mouth daily.       . pravastatin (PRAVACHOL) 10 MG tablet Take 10 mg by mouth at bedtime.       [name] warfarin (COUMADIN) 2.5 MG tablet take 1 tablet by mouth once daily EXCEPT 1AND1/2 TABLET ON THURSDAY  45 tablet  5   No current facility-administered medications for this visit.    Allergies:    Allergies  Allergen Reactions  . Amoxicillin Other (See Comments)    tongue felt bruised.  . Celebrex [Celecoxib] Other (See Comments)    Mouth sores    Social History:  The patient  reports that she has never smoked. She does not have any smokeless tobacco history on file. She reports that she does not drink alcohol or use illicit drugs.   Family History:  The patient's family history includes Arrhythmia in her brother; CAD in her brother; Cancer - Prostate in her father;  Diabetes in her mother and sister; Hypertension in her brother and sister; Pulmonary embolism in her father; Pulmonary fibrosis in her brother.   ROS:  Please see the history of present illness.      All other systems reviewed and negative.   PHYSICAL EXAM: VS:  BP 178/62  Pulse 54  Ht 5' (1.524 m)  Wt 128 lb (58.06 kg)  BMI 25.00 kg/m2 Well nourished, well developed, in no acute distress  ASSESSMENT AND PLAN:  1. Severe AS - she is completely asymptomatic- repeat echo now shows progression of her AS with a mean SVG of 26mmHg.  She does not want to consider open heart surgery but we have talked about TAVR and she would consider this procedure. She is still very active and looks younger than her stated age. I am going to refer her to Dr. [name] for evaluation for possible TAVR.   2. HTN- elevated today but she has been  rushing around. At home her BP runs 116-171/57-56mmHg. Most of them are under 140'/60's. I will keep her on her current medical regimen. I am leary to increase her BP meds further due to severe AS. - continue Lisinopril  3. Chronic LE edema - controlled on no diuretic 4. PAF- maintaining NSR on Amio - continue amio and warfarin  5. Chronic systemic anticoagulation    Signed, Fransico Him, MD 02/21/2014 8:29 AM

## 2014-02-21 NOTE — Patient Instructions (Signed)
Your physician recommends that you continue on your current medications as directed. Please refer to the Current Medication list given to you today.  You have been referred to Dr Eilene Ghazi Cardiac/Thoracic Surgery  [facility] [address]  [place], [place] [address]

## 2014-03-03 ENCOUNTER — Encounter: Payer: Self-pay | Admitting: Thoracic Surgery (Cardiothoracic Vascular Surgery)

## 2014-03-03 ENCOUNTER — Ambulatory Visit (INDEPENDENT_AMBULATORY_CARE_PROVIDER_SITE_OTHER): Payer: Medicare Other | Admitting: *Deleted

## 2014-03-03 ENCOUNTER — Institutional Professional Consult (permissible substitution) (INDEPENDENT_AMBULATORY_CARE_PROVIDER_SITE_OTHER): Payer: Medicare Other | Admitting: Thoracic Surgery (Cardiothoracic Vascular Surgery)

## 2014-03-03 VITALS — BP 168/67 | HR 54 | Resp 16 | Ht 60.5 in | Wt 128.4 lb

## 2014-03-03 DIAGNOSIS — I480 Paroxysmal atrial fibrillation: Secondary | ICD-10-CM

## 2014-03-03 DIAGNOSIS — Z5181 Encounter for therapeutic drug level monitoring: Secondary | ICD-10-CM | POA: Diagnosis not present

## 2014-03-03 DIAGNOSIS — Z7901 Long term (current) use of anticoagulants: Secondary | ICD-10-CM | POA: Diagnosis not present

## 2014-03-03 DIAGNOSIS — I359 Nonrheumatic aortic valve disorder, unspecified: Secondary | ICD-10-CM | POA: Diagnosis not present

## 2014-03-03 DIAGNOSIS — I350 Nonrheumatic aortic (valve) stenosis: Secondary | ICD-10-CM

## 2014-03-03 DIAGNOSIS — I4891 Unspecified atrial fibrillation: Secondary | ICD-10-CM | POA: Diagnosis not present

## 2014-03-03 LAB — POCT INR: INR: 2

## 2014-03-03 NOTE — Progress Notes (Signed)
[id number]       , [id number]             [phone]     CARDIOTHORACIC SURGERY CONSULTATION REPORT  Referring Provider is Sueanne Margarita, MD PCP is Alexa Coma, MD  Chief Complaint  Patient presents with  . Aortic Stenosis    severe, ECHO 02/08/14    HPI:  Patient is a 80 year old widowed white female from [place] with known history of severe aortic stenosis, hypertension, paroxysmal atrial fibrillation on long-term anticoagulation with warfarin, chronic kidney disease and degenerative arthritis with osteoporosis who has been referred to discuss treatment options for management of severe aortic stenosis. The patient states that she was diagnosed with heart murmur at age 62. She denies any known history of rheumatic fever or rheumatic heart disease. During her childhood she was advised to avoid strenuous exercise.  She has remained physically active and functionally independent for all of her life. She was diagnosed with paroxysmal atrial fibrillation a proximally 6 or 8 years ago. She has been chronically anticoagulated using warfarin and for the last several years she has remained stable in sinus rhythm on low-dose amiodarone.  Echocardiograms have demonstrated the presence of aortic stenosis which has progressed during followup.  Echocardiogram performed September of 2014 reportedly demonstrated a peak velocity across the aortic valve measured 4.2 m/s corresponding to peak and mean transvalvular gradients estimated 71 and 42 mmHg, respectively. Aortic valve area was estimated 0.68 cm. At that time the patient remained asymptomatic. In May of this year she was hospitalized briefly with dry nonproductive cough and acute exacerbation of shortness of breath. Pro-BNP level was elevated greater than 3000.  Chest x-ray revealed mild bronchitic changes and a large cardiac silhouette. She was told that she had pneumonia.  More recently she was seen in followup  by Dr. [name]. Repeat echocardiogram performed 02/09/2014 demonstrated further progression of aortic stenosis with peak velocity measured across the aortic valve Of 5.4 m/s corresponding to peak and mean transvalvular gradients of 115 and 74 mm mercury, respectively. Aortic valve area was estimated less than 0.5 cm.  There was moderate mitral regurgitation and mild left ventricular systolic dysfunction.  The patient was referred to discuss treatment options for management of severe aortic stenosis.  The patient is widowed and lives alone and [place]. She has a son who lives locally and [place].  She still drives an automobile, fixes her own meals and does her own shopping. She has some assistance in home with cleaning. She admits that she lives a very sedentary lifestyle. She is physically limited somewhat because of chronic pain in her right hip and thigh. She does not walk very much but she is able to walk with relatively mild limitation. She avoids things that make her tired or exacerbate pain in her hip. She denies any recent symptoms of resting shortness of breath, PND, orthopnea, palpitations, or syncope. She has occasional mild dizzy spells. She has some chronic lower extremity edema.  Past Medical History  Diagnosis Date  . Mediastinal goiter     removed 1988  . CKD (chronic kidney disease)     stage III  . Osteopenia   . Osteoporosis   . Vitamin D deficiency   . Paroxysmal atrial fibrillation   . Hypertension   . LBBB (left bundle branch block)   . Aortic valve sclerosis   . Hx of orthostatic hypotension   . Colon, diverticulosis   . Tachycardia-bradycardia   .  Diabetes mellitus     diet controlled  . Esophageal reflux   . Aortic stenosis, severe 04/2013  . GERD (gastroesophageal reflux disease)     Past Surgical History  Procedure Laterality Date  . Cataract extraction    . Carpal tunnel release    . Mri  08/18/07    head (diabetes heart study [facility])  . Mediastinal  removal of a goiter      Family History  Problem Relation Age of Onset  . Diabetes Mother   . Cancer - Prostate Father   . Pulmonary embolism Father   . Hypertension Sister   . Diabetes Sister   . Hypertension Brother   . Arrhythmia Brother     afib  . CAD Brother   . Pulmonary fibrosis Brother     History   Social History  . Marital Status: Widowed    Spouse Name: N/A    Number of Children: 3  . Years of Education: N/A   Occupational History  . retire    Social History Main Topics  . Smoking status: Never Smoker   . Smokeless tobacco: Never Used  . Alcohol Use: No  . Drug Use: No  . Sexual Activity: Not on file   Other Topics Concern  . Not on file   Social History Narrative   Lives in [place].  Widowed.    Current Outpatient Prescriptions  Medication Sig Dispense Refill  . acetaminophen (TYLENOL) 500 MG tablet Take 500 mg by mouth every 6 (six) hours as needed for moderate pain.      [name] amiodarone (PACERONE) 200 MG tablet Take 3/4 of a tablet by mouth once daily      . Calcium Carb-Cholecalciferol (CALCIUM 600/VITAMIN D3 PO) Take 1 tablet by mouth 2 (two) times daily.      [name] lisinopril (PRINIVIL,ZESTRIL) 20 MG tablet Take 1 tablet (20 mg total) by mouth 2 (two) times daily.  60 tablet  5  . methimazole (TAPAZOLE) 10 MG tablet Take 5 mg by mouth daily.       . pravastatin (PRAVACHOL) 10 MG tablet Take 10 mg by mouth at bedtime.       [name] warfarin (COUMADIN) 2.5 MG tablet take 1 tablet by mouth once daily EXCEPT 1AND1/2 TABLET ON THURSDAY  45 tablet  5   No current facility-administered medications for this visit.    Allergies  Allergen Reactions  . Amoxicillin Other (See Comments)    tongue felt bruised.  . Celebrex [Celecoxib] Other (See Comments)    Mouth sores      Review of Systems:   General:  marginal appetite, fair energy, no weight gain, slight weight loss, no fever  Cardiac:  no chest pain with exertion, no chest pain at rest, no SOB with  exertion but she doesn't exert herself, no resting SOB, no PND, no orthopnea, no palpitations, + arrhythmia, + atrial fibrillation, + LE edema, + mild dizzy spells, no syncope  Respiratory:  no shortness of breath, no home oxygen, no productive cough, + dry cough, no bronchitis, no wheezing, no hemoptysis, no asthma, no pain with inspiration or cough, no sleep apnea, no CPAP at night  GI:   no difficulty swallowing, no reflux, no frequent heartburn, no hiatal hernia, no abdominal pain, no constipation, no diarrhea, no hematochezia, no hematemesis, no melena, + heme occult stool noted during recent hospitalization  GU:   no dysuria,  no frequency, no urinary tract infection, no hematuria, no kidney stones, + mild  kidney disease  Vascular:  no pain suggestive of claudication, no pain in feet, no leg cramps, no varicose veins, no DVT, no non-healing foot ulcer  Neuro:   no stroke, no TIA's, no seizures, no headaches, no temporary blindness one eye,  no slurred speech, no peripheral neuropathy, + chronic pain in right hip, mild instability of gait, no memory/cognitive dysfunction  Musculoskeletal: + arthritis, no joint swelling, no myalgias, + mild difficulty walking, slightly limited mobility   Skin:   no rash, no itching, no skin infections, no pressure sores or ulcerations  Psych:   no anxiety, no depression, no nervousness, no unusual recent stress  Eyes:   no blurry vision, no floaters, no recent vision changes, + wears glasses or contacts  ENT:   + hearing loss, no loose or painful teeth, no dentures, last saw dentist April 2015  Hematologic:  + easy bruising, no abnormal bleeding, no clotting disorder, no frequent epistaxis, no complications on coumadin therapy  Endocrine:  + diabetes, checks CBG's at home     Physical Exam:   BP 168/67  Pulse 54  Resp 16  Ht 5' 0.5" (1.537 m)  Wt 128 lb 6.4 oz (58.242 kg)  BMI 24.65 kg/m2  SpO2 98%  General:  Elderly and frail but  well-appearing  HEENT:  Unremarkable   Neck:   no JVD, no bruits, no adenopathy, + goiter  Chest:   clear to auscultation, symmetrical breath sounds, no wheezes, no rhonchi   CV:   RRR, grade III/VI late-peaking crescendo/decrescendo systolic murmur at LSB with harsh holosystolic murmur at apex   Abdomen:  soft, non-tender, no masses   Extremities:  warm, well-perfused, pulses not palpable at ankle, + mild LE edema  Rectal/GU  Deferred  Neuro:   Grossly non-focal and symmetrical throughout  Skin:   Clean and dry, no rashes, no breakdown   Diagnostic Tests:   Transthoracic Echocardiography  Patient: Alexa Mcdonald, Alexa Mcdonald MR #: [id number] Study Date: 02/09/2014 Gender: F Age: 29 Height: 157.5 cm Weight: 59 kg BSA: 1.62 m^2 Pt. Status: Room:  ORDERING Fransico Him, MD REFERRING Fransico Him, MD SONOGRAPHER Victorio Palm, RDCS ATTENDING Ena Dawley, M.D. PERFORMING [facility], Outpatient  cc:  ------------------------------------------------------------------- LV EF: 50% - 55%  ------------------------------------------------------------------- Indications: Aortic stenosis /insufficiency 424.1.  ------------------------------------------------------------------- History: PMH: Tachy-brady syndrome. Acquired from the patient and from the patient&'s chart. Bilateral lower extremity edema. Paroxysmal atrial fibrillation. Left bundle branch block. Severe aortic stenosis. Risk factors: Hypertension. Diabetes mellitus.  ------------------------------------------------------------------- Study Conclusions  - Left ventricle: The cavity size was normal. Systolic function was normal. The estimated ejection fraction was in the range of 50% to 55%. Wall motion was normal; there were no regional wall motion abnormalities. Features are consistent with a pseudonormal left ventricular filling pattern, with concomitant abnormal relaxation and increased filling pressure (grade 2  diastolic dysfunction). Doppler parameters are consistent with elevated ventricular end-diastolic filling pressure. - Aortic valve: Trileaflet; severely thickened, severely calcified leaflets. There was severe stenosis. Mean gradient (S): 74 mm Hg. Peak gradient (S): 115 mm Hg. - Mitral valve: Severe mitral annular calcifications predominantly posterior. There was moderate regurgitation. Effective regurgitant orifice (PISA): 0.23 cm^2. Regurgitant volume (PISA): 65 ml. Regurgitant fraction (PISA): 48.43%. - Left atrium: The atrium was severely dilated. - Pulmonic valve: There was moderate regurgitation. - Pulmonary arteries: Systolic pressure was mildly increased. PA peak pressure: 42 mm Hg (S).  Impressions:  - Normal biventricular size and function. Pseudonormal pattern of the distaolic dysfunction with severely elevated filling  pressures. Severe aortic stenosis with mean transaortic gradient 74 mmHg. Moderate mitral regurgitation, severely dilated left atrium.  ------------------------------------------------------------------- Labs, prior tests, procedures, and surgery: Echocardiography (September 2014). The aortic valve showed severe stenosis. EF was 56% and PA pressure was 55 (systolic). Aortic valve: peak gradient of 71 mm Hg and mean gradient of 42 mm Hg.  Transthoracic echocardiography. M-mode, complete 2D, spectral Doppler, and color Doppler. Birthdate: Patient birthdate: 20-Jul-1921. Age: Patient is 80 yr old. Sex: Gender: female. Height: Height: 157.5 cm. Height: 62 in. Weight: Weight: 59 kg. Weight: 129.7 lb. Body mass index: BMI: 23.8 kg/m^2. Body surface area: BSA: 1.62 m^2. Blood pressure: 110/75 Patient status: Outpatient. Study date: Study date: 02/09/2014. Study time: 04:12 PM. Location: [place] [facility]  -------------------------------------------------------------------  ------------------------------------------------------------------- Left ventricle:  The cavity size was normal. Systolic function was normal. The estimated ejection fraction was in the range of 50% to 55%. Wall motion was normal; there were no regional wall motion abnormalities. Features are consistent with a pseudonormal left ventricular filling pattern, with concomitant abnormal relaxation and increased filling pressure (grade 2 diastolic dysfunction). Doppler parameters are consistent with elevated ventricular end-diastolic filling pressure.  ------------------------------------------------------------------- Aortic valve: Trileaflet; severely thickened, severely calcified leaflets. Doppler: There was severe stenosis. There was no regurgitation. VTI ratio of LVOT to aortic valve: 0.17. Valve area (VTI): 0.43 cm^2. Indexed valve area (VTI): 0.27 cm^2/m^2. Valve area (Vmax): 0.48 cm^2. Indexed valve area (Vmax): 0.3 cm^2/m^2. Mean gradient (S): 74 mm Hg. Peak gradient (S): 115 mm Hg.  ------------------------------------------------------------------- Aorta: Aortic root: The aortic root was normal in size.  ------------------------------------------------------------------- Mitral valve: Severe mitral annular calcifications predominantly posterior. Mobility was not restricted. Doppler: Transvalvular velocity was within the normal range. There was no evidence for stenosis. There was moderate regurgitation. Peak gradient (D): 9 mm Hg.  ------------------------------------------------------------------- Left atrium: The atrium was severely dilated.  ------------------------------------------------------------------- Right ventricle: The cavity size was normal. Wall thickness was normal. Systolic function was normal.  ------------------------------------------------------------------- Pulmonic valve: Structurally normal valve. Cusp separation was normal. Doppler: Transvalvular velocity was within the normal range. There was no evidence for stenosis. There was  moderate regurgitation.  ------------------------------------------------------------------- Tricuspid valve: Structurally normal valve. Doppler: Transvalvular velocity was within the normal range. There was mild regurgitation.  ------------------------------------------------------------------- Pulmonary artery: The main pulmonary artery was normal-sized. Systolic pressure was mildly increased.  ------------------------------------------------------------------- Right atrium: The atrium was normal in size.  ------------------------------------------------------------------- Pericardium: There was no pericardial effusion.  ------------------------------------------------------------------- Systemic veins: Inferior vena cava: The vessel was normal in size.  ------------------------------------------------------------------- Prepared and Electronically Authenticated by  Ena Dawley, M.D. [id number]:36:36  ------------------------------------------------------------------- Measurements  Left ventricle Value Reference LV ID, ED, PLAX chordal (N) 45.4 mm 43 - 52 LV ID, ES, PLAX chordal (N) 28.9 mm 23 - 38 LV fx shortening, PLAX chordal (N) 36 % >=29 LV PW thickness, ED 10.1 mm --------- IVS/LV PW ratio, ED (N) 0.97 <=1.3 Stroke volume, 2D 69 ml --------- Stroke volume/bsa, 2D 43 ml/m^2 --------- LV e&', lateral 6.25 cm/s --------- LV E/e&', lateral 24.32 --------- LV e&', medial 4.83 cm/s --------- LV E/e&', medial 31.47 --------- LV e&', average 5.54 cm/s --------- LV E/e&', average 27.44 ---------  Ventricular septum Value Reference IVS thickness, ED 9.81 mm ---------  LVOT Value Reference LVOT ID, S 18 mm --------- LVOT area 2.54 cm^2 --------- LVOT ID 18 mm --------- LVOT VTI, S 27.2 cm --------- Stroke volume (SV), LVOT DP 69.2 ml --------- Stroke index (SV/bsa), LVOT DP 42.9 ml/m^2 ---------  Aortic valve Value Reference Aortic  valve peak velocity, S  537 cm/s --------- Aortic valve mean velocity, S 414 cm/s --------- Aortic valve VTI, S 161 cm --------- Aortic mean gradient, S 74 mm Hg --------- Aortic peak gradient, S 115 mm Hg --------- VTI ratio, LVOT/AV 0.17 --------- Aortic valve area, VTI 0.43 cm^2 --------- Aortic valve area/bsa, VTI 0.27 cm^2/m^2 --------- Aortic valve area, peak velocity 0.48 cm^2 --------- Aortic valve area/bsa, peak 0.3 cm^2/m^2 --------- velocity  Aorta Value Reference Aortic root ID, ED 26 mm ---------  Left atrium Value Reference LA ID, A-P, ES 46 mm --------- LA ID/bsa, A-P (H) 2.85 cm/m^2 <=2.2  Mitral valve Value Reference Mitral E-wave peak velocity 152 cm/s --------- Mitral A-wave peak velocity 132 cm/s --------- Mitral deceleration time (N) 173 ms 150 - 230 Mitral peak gradient, D 9 mm Hg --------- Mitral E/A ratio, peak 1.1 --------- Aliasing velocity, MR PISA 30.5 cm/s --------- Mitral regurg PISA radius 9 mm --------- Mitral maximal regurg velocity, 679 cm/s --------- PISA Mitral regurg VTI, PISA 283 cm --------- Mitral ERO, PISA 0.23 cm^2 --------- Mitral regurg volume, PISA 65 ml --------- Mitral regurg fraction, PISA 48.43 % ---------  Pulmonary arteries Value Reference PA pressure, S, DP (H) 42 mm Hg <=30  Tricuspid valve Value Reference Tricuspid regurg peak velocity 312 cm/s --------- Tricuspid peak RV-RA gradient 39 mm Hg --------- Tricuspid maximal regurg 312 cm/s --------- velocity, PISA  Systemic veins Value Reference Estimated CVP 3 mm Hg ---------  Right ventricle Value Reference RV pressure, S, DP (H) 42 mm Hg <=30 RV s&', lateral, S 8.33 cm/s ---------  Legend: (L) and (H) mark values outside specified reference range.  (N) marks values inside specified reference range.    STS Risk Calculator  Procedure    AVR  Risk of Mortality   9.7% Morbidity or Mortality  33.8% Prolonged LOS   19.3% Short LOS    8.8% Permanent Stroke   4.0% Prolonged Vent  Support  23.7% DSW Infection    0.1% Renal Failure    10.8% Reoperation    11.4%    Impression:  Patient has stage D severe aortic stenosis.  She claims to be relatively asymptomatic although she admits that she is very sedentary and avoids activities that make her tired. She was hospitalized in May of this year with acute shortness of breath and dry cough associated with elevated pro-BNP level.  I have personally reviewed the patient's recent transthoracic echocardiogram. She has very severe aortic stenosis with severe calcification in restricted leaflet motion involving all 3 leaflets. She has a relatively small aortic root and peak velocity across the aortic valve measured in excess of 5 m/s. She also has at least moderate mitral regurgitation and severe left atrial enlargement.  Left ventricular systolic function is mildly reduced with ejection fraction estimated 50-55%.  Remarkably, at this point the patient remains entirely functionally independent. She lives alone, drives an automobile, and tends to her simple needs around the house.  Conventional surgery would likely require aortic root enlargement or root replacement because of the small size of her aortic root, and concomitant mitral valve repair or replacement might be necessary as well.  Given these circumstances and her very advanced age, I would not consider her a candidate for conventional surgical aortic valve replacement.  Transcatheter aortic valve replacement might be a reasonable treatment alternative, although she would need to undergo a variety of diagnostic tests to further establish whether or not she might be a candidate. This probably should include transesophageal echocardiogram to  further evaluate the functional anatomy in severity of the patient's mitral regurgitation.   Plan:  The patient and her family were counseled at length regarding treatment alternatives for management of severe symptomatic aortic stenosis.  Alternative approaches such as conventional aortic valve replacement, transcatheter aortic valve replacement, and palliative medical therapy were compared and contrasted at length.  The risks associated with transcatheter aortic valve replacement were been discussed in detail, as were expectations for post-operative convalescence. Long-term prognosis with medical therapy was discussed. This discussion was placed in the context of the patient's own specific clinical presentation and past medical history.  All of their questions been addressed.  At this point the patient wants to think matters over further before pursuing any further diagnostic tests. I suggested that it might be helpful for her to be seen in consultation by Dr. [name] if she remains interested in considering possible transcatheter aortic valve replacement. The patient will return here to discuss matters further and 4 weeks.    I spent in excess of 90 minutes during the conduct of this office consultation and >50% of this time involved direct face-to-face encounter with the patient for counseling and/or coordination of their care.  Valentina Gu. [name], MD 03/03/2014 3:02 PM

## 2014-03-16 DIAGNOSIS — I10 Essential (primary) hypertension: Secondary | ICD-10-CM | POA: Diagnosis not present

## 2014-03-16 DIAGNOSIS — I471 Supraventricular tachycardia: Secondary | ICD-10-CM | POA: Diagnosis not present

## 2014-03-16 DIAGNOSIS — Z79899 Other long term (current) drug therapy: Secondary | ICD-10-CM | POA: Diagnosis not present

## 2014-03-16 DIAGNOSIS — Z0000 Encounter for general adult medical examination without abnormal findings: Secondary | ICD-10-CM | POA: Diagnosis not present

## 2014-03-16 DIAGNOSIS — E559 Vitamin D deficiency, unspecified: Secondary | ICD-10-CM | POA: Diagnosis not present

## 2014-03-27 ENCOUNTER — Ambulatory Visit (INDEPENDENT_AMBULATORY_CARE_PROVIDER_SITE_OTHER): Payer: Medicare Other | Admitting: Thoracic Surgery (Cardiothoracic Vascular Surgery)

## 2014-03-27 ENCOUNTER — Encounter: Payer: Self-pay | Admitting: Thoracic Surgery (Cardiothoracic Vascular Surgery)

## 2014-03-27 VITALS — BP 167/67 | HR 60 | Ht 60.0 in | Wt 128.0 lb

## 2014-03-27 DIAGNOSIS — I350 Nonrheumatic aortic (valve) stenosis: Secondary | ICD-10-CM

## 2014-03-27 DIAGNOSIS — I359 Nonrheumatic aortic valve disorder, unspecified: Secondary | ICD-10-CM | POA: Diagnosis not present

## 2014-03-27 NOTE — Progress Notes (Signed)
[address]       [place] [address]             [phone]     CARDIOTHORACIC SURGERY OFFICE NOTE  Referring Provider is TURNER, Eber Hong, MD PCP is [name], MD   HPI:  Patient returns to the office for followup of severe aortic stenosis. She was originally seen in consultation on 03/03/2014.  She states that over the past month she has remained very stable. She denies any symptoms of exertional shortness of breath, although she admits that she remains relatively sedentary. She states that she does everything she wants to do. She's never had any chest pain or chest tightness. She states that occasionally she will get slightly dizzy if she stands up suddenly, but otherwise she denies any symptoms of presyncope or syncope. Over the past month she has been thinking about things and options for management of her underlying severe aortic stenosis. She has come to a decision that she does not wish to proceed with any further diagnostic workup at this time.   Current Outpatient Prescriptions  Medication Sig Dispense Refill  . acetaminophen (TYLENOL) 500 MG tablet Take 500 mg by mouth every 6 (six) hours as needed for moderate pain.      [name] amiodarone (PACERONE) 200 MG tablet Take 3/4 of a tablet by mouth once daily      . Calcium Carb-Cholecalciferol (CALCIUM 600/VITAMIN D3 PO) Take 1 tablet by mouth 2 (two) times daily.      [name] lisinopril (PRINIVIL,ZESTRIL) 20 MG tablet Take 1 tablet (20 mg total) by mouth 2 (two) times daily.  60 tablet  5  . methimazole (TAPAZOLE) 10 MG tablet Take 5 mg by mouth daily.       . pravastatin (PRAVACHOL) 10 MG tablet Take 10 mg by mouth at bedtime.       [name] warfarin (COUMADIN) 2.5 MG tablet take 1 tablet by mouth once daily EXCEPT 1AND1/2 TABLET ON THURSDAY  45 tablet  5   No current facility-administered medications for this visit.      Physical Exam:   BP 167/67  Pulse 60  Ht 5' (1.524 m)  Wt 128 lb (58.06 kg)  BMI  25.00 kg/m2  SpO2 95%  General:  Well-appearing  Chest:   Clear  CV:   Irregular rate and rhythm with systolic murmur  Incisions:  n/a  Abdomen:  Soft nontender  Extremities:  Warm and well-perfused  Diagnostic Tests:  n/a   Impression:  Patient has stage D severe aortic stenosis. She claims to be relatively asymptomatic although she admits that she is very sedentary and avoids activities that make her tired. She was hospitalized in May of this year with acute shortness of breath and dry cough associated with elevated pro-BNP level. I have personally reviewed the patient's recent transthoracic echocardiogram. She has very severe aortic stenosis with severe calcification in restricted leaflet motion involving all 3 leaflets. She has a relatively small aortic root and peak velocity across the aortic valve measured in excess of 5 m/s. She also has at least moderate mitral regurgitation and severe left atrial enlargement. Left ventricular systolic function is mildly reduced with ejection fraction estimated 50-55%. Remarkably, at this point the patient remains entirely functionally independent. She lives alone, drives an automobile, and tends to her simple needs around the house. Conventional surgery would likely require aortic root enlargement or root replacement because of the small size of her aortic root,  and concomitant mitral valve repair or replacement might be necessary as well. Given these circumstances and her very advanced age, I would not consider her a candidate for conventional surgical aortic valve replacement. Transcatheter aortic valve replacement might be a reasonable treatment alternative, although she would need to undergo a variety of diagnostic tests to further establish whether or not she might be a candidate. This probably should include transesophageal echocardiogram to further evaluate the functional anatomy in severity of the patient's mitral regurgitation.  At this point in  time the patient does not wish to proceed with any further diagnostic workup or to consider having her aortic stenosis treated surgically.   Plan:  I spent in excess of 15 minutes reviewing the natural history of severe aortic stenosis and associated treatment options. I discussed symptoms that the patient should be concerned about including any perceived change in excess tolerance, the development of shortness of breath with activity or at rest, the development of chest discomfort, dizzy spells, or syncope.  The relatively low likelihood of sudden death was discussed. All of her questions been addressed. We will make arrangements for her to undergo followup echocardiogram in 3 months, after which time she will be seen in consultation by Dr. [name].  I spent in excess of 15 minutes during the conduct of this office consultation and >50% of this time involved direct face-to-face encounter with the patient for counseling and/or coordination of their care.   Valentina Gu. [name], MD 03/27/2014 11:36 AM

## 2014-03-27 NOTE — Patient Instructions (Signed)
Call and report any change in symptoms of shortness of breath either with exertion or at rest, chest discomfort, or dizzy spells, or passing out.

## 2014-03-28 ENCOUNTER — Other Ambulatory Visit: Payer: Self-pay | Admitting: General Surgery

## 2014-03-28 DIAGNOSIS — I359 Nonrheumatic aortic valve disorder, unspecified: Secondary | ICD-10-CM

## 2014-03-31 ENCOUNTER — Ambulatory Visit (INDEPENDENT_AMBULATORY_CARE_PROVIDER_SITE_OTHER): Payer: Medicare Other | Admitting: Pharmacist

## 2014-03-31 DIAGNOSIS — I4891 Unspecified atrial fibrillation: Secondary | ICD-10-CM | POA: Diagnosis not present

## 2014-03-31 DIAGNOSIS — Z5181 Encounter for therapeutic drug level monitoring: Secondary | ICD-10-CM | POA: Diagnosis not present

## 2014-03-31 LAB — POCT INR: INR: 1.7

## 2014-04-18 ENCOUNTER — Other Ambulatory Visit (HOSPITAL_COMMUNITY): Payer: Self-pay | Admitting: Family Medicine

## 2014-04-18 DIAGNOSIS — Z1231 Encounter for screening mammogram for malignant neoplasm of breast: Secondary | ICD-10-CM

## 2014-04-27 ENCOUNTER — Other Ambulatory Visit: Payer: Self-pay | Admitting: *Deleted

## 2014-04-27 ENCOUNTER — Ambulatory Visit (INDEPENDENT_AMBULATORY_CARE_PROVIDER_SITE_OTHER): Payer: Medicare Other

## 2014-04-27 DIAGNOSIS — I4891 Unspecified atrial fibrillation: Secondary | ICD-10-CM | POA: Diagnosis not present

## 2014-04-27 DIAGNOSIS — Z5181 Encounter for therapeutic drug level monitoring: Secondary | ICD-10-CM | POA: Diagnosis not present

## 2014-04-27 LAB — POCT INR: INR: 2.7

## 2014-04-27 MED ORDER — WARFARIN SODIUM 2.5 MG PO TABS: ORAL_TABLET | ORAL | Status: DC

## 2014-05-09 ENCOUNTER — Ambulatory Visit (HOSPITAL_COMMUNITY)
Admission: RE | Admit: 2014-05-09 | Discharge: 2014-05-09 | Disposition: A | Discharge status: Home / Self Care | Payer: Medicare Other | Source: Ambulatory Visit | Attending: Family Medicine | Admitting: Family Medicine

## 2014-05-09 DIAGNOSIS — Z1231 Encounter for screening mammogram for malignant neoplasm of breast: Secondary | ICD-10-CM | POA: Insufficient documentation

## 2014-05-09 DIAGNOSIS — Z23 Encounter for immunization: Secondary | ICD-10-CM | POA: Diagnosis not present

## 2014-05-25 ENCOUNTER — Ambulatory Visit (INDEPENDENT_AMBULATORY_CARE_PROVIDER_SITE_OTHER): Payer: Medicare Other | Admitting: Pharmacist Clinician (PhC)/ Clinical Pharmacy Specialist

## 2014-05-25 DIAGNOSIS — I4891 Unspecified atrial fibrillation: Secondary | ICD-10-CM

## 2014-05-25 DIAGNOSIS — Z5181 Encounter for therapeutic drug level monitoring: Secondary | ICD-10-CM | POA: Diagnosis not present

## 2014-05-25 LAB — POCT INR: INR: 3

## 2014-05-29 DIAGNOSIS — Z23 Encounter for immunization: Secondary | ICD-10-CM | POA: Diagnosis not present

## 2014-05-29 DIAGNOSIS — E0520 Thyrotoxicosis with toxic multinodular goiter without thyrotoxic crisis or storm: Secondary | ICD-10-CM | POA: Diagnosis not present

## 2014-06-19 ENCOUNTER — Ambulatory Visit (HOSPITAL_COMMUNITY): Payer: Medicare Other | Attending: Cardiology | Admitting: Radiology

## 2014-06-19 ENCOUNTER — Ambulatory Visit (INDEPENDENT_AMBULATORY_CARE_PROVIDER_SITE_OTHER): Payer: Medicare Other | Admitting: Pharmacist

## 2014-06-19 DIAGNOSIS — I359 Nonrheumatic aortic valve disorder, unspecified: Secondary | ICD-10-CM

## 2014-06-19 DIAGNOSIS — Z5181 Encounter for therapeutic drug level monitoring: Secondary | ICD-10-CM | POA: Diagnosis not present

## 2014-06-19 DIAGNOSIS — I350 Nonrheumatic aortic (valve) stenosis: Secondary | ICD-10-CM | POA: Insufficient documentation

## 2014-06-19 DIAGNOSIS — I4891 Unspecified atrial fibrillation: Secondary | ICD-10-CM | POA: Diagnosis not present

## 2014-06-19 LAB — POCT INR: INR: 3.3

## 2014-06-19 NOTE — Progress Notes (Signed)
Echocardiogram performed.  

## 2014-06-26 DIAGNOSIS — Z85828 Personal history of other malignant neoplasm of skin: Secondary | ICD-10-CM | POA: Diagnosis not present

## 2014-06-26 DIAGNOSIS — L821 Other seborrheic keratosis: Secondary | ICD-10-CM | POA: Diagnosis not present

## 2014-06-26 DIAGNOSIS — L300 Nummular dermatitis: Secondary | ICD-10-CM | POA: Diagnosis not present

## 2014-06-26 DIAGNOSIS — L853 Xerosis cutis: Secondary | ICD-10-CM | POA: Diagnosis not present

## 2014-06-27 ENCOUNTER — Encounter: Payer: Self-pay | Admitting: Cardiovascular Disease

## 2014-06-27 ENCOUNTER — Ambulatory Visit (INDEPENDENT_AMBULATORY_CARE_PROVIDER_SITE_OTHER): Payer: Medicare Other | Admitting: Cardiovascular Disease

## 2014-06-27 VITALS — BP 154/68 | HR 55 | Ht 60.0 in | Wt 130.8 lb

## 2014-06-27 DIAGNOSIS — I350 Nonrheumatic aortic (valve) stenosis: Secondary | ICD-10-CM

## 2014-06-27 NOTE — Patient Instructions (Signed)
Your physician recommends that you schedule a follow-up appointment as needed with Dr Cooper.   Your physician recommends that you continue on your current medications as directed. Please refer to the Current Medication list given to you today.  

## 2014-06-27 NOTE — Progress Notes (Signed)
HPI:   This is a 80 year old woman referred for evaluation of severe aortic stenosis and moderate to severe mitral regurgitation. Other medical problems include paroxysmal atrial fibrillation, hypertension, and chronic leg swelling.  Atrial fibrillation has been managed with a rhythm control strategy with Amiodarone and anticoagulation with warfarin.  The patient has had a heart murmur for most of her life. This was diagnosed during childhood. She has had serial echocardiograms demonstrating aortic stenosis which has progressed into the severe range over the last few years. Because of her advanced age and asymptomatic status she was not referred for evaluation until recently. She saw Dr. [name] in July for initial surgical consultation. At that time an echocardiogram demonstrated peak and mean transvalvular gradients of 115 and 74 mmHg, respectively. Her aortic valve area calculated to 0.5 cm. After thinking over her options, she decided to continue with medical therapy. She presents today for another evaluation and further discussion.  The patient lives alone. She remains remarkably independent. She has been widowed for 29 years. She has 3 sons, one of whom lives locally. The patient is able to do all of her activities of daily living. She still drives a car and can do her own shopping, laundry, and household chores. She feels very strongly that she never wants to be in a nursing home. She denies any limitation from chest pain, chest pressure, shortness of breath, or exertional lightheadedness. She does admit to dizziness with postural changes. She doesn't do a lot of walking because of right hip pain. She has no other complaints. The patient is here alone today for her office visit.  Outpatient Encounter Prescriptions as of 06/27/2014  Medication Sig  . acetaminophen (TYLENOL) 500 MG tablet Take 500 mg by mouth every 6 (six) hours as needed for moderate pain.  [name] amiodarone (PACERONE) 200 MG tablet Take  3/4 of a tablet by mouth once daily  . Calcium Carb-Cholecalciferol (CALCIUM 600/VITAMIN D3 PO) Take 1 tablet by mouth 2 (two) times daily.  [name] lisinopril (PRINIVIL,ZESTRIL) 20 MG tablet Take 1 tablet (20 mg total) by mouth 2 (two) times daily.  . methimazole (TAPAZOLE) 10 MG tablet Take 5 mg by mouth daily.   . pravastatin (PRAVACHOL) 10 MG tablet Take 10 mg by mouth at bedtime.   [name] warfarin (COUMADIN) 2.5 MG tablet Take as directed by [facility]    Amoxicillin and Celebrex  Past Medical History  Diagnosis Date  . Mediastinal goiter     removed 1988  . CKD (chronic kidney disease)     stage III  . Osteopenia   . Osteoporosis   . Vitamin D deficiency   . Paroxysmal atrial fibrillation   . Hypertension   . LBBB (left bundle branch block)   . Aortic valve sclerosis   . Hx of orthostatic hypotension   . Colon, diverticulosis   . Tachycardia-bradycardia   . Diabetes mellitus     diet controlled  . Esophageal reflux   . Aortic stenosis, severe 04/2013  . GERD (gastroesophageal reflux disease)     Past Surgical History  Procedure Laterality Date  . Cataract extraction    . Carpal tunnel release    . Mri  08/18/07    head (diabetes heart study at [facility])  . Mediastinal removal of a goiter      History   Social History  . Marital Status: Widowed    Spouse Name: N/A    Number of Children: 3  . Years of Education: N/A  Occupational History  . retire    Social History Main Topics  . Smoking status: Never Smoker   . Smokeless tobacco: Never Used  . Alcohol Use: No  . Drug Use: No  . Sexual Activity: Not on file   Other Topics Concern  . Not on file   Social History Narrative   Lives in [place].  Widowed.    Family History  Problem Relation Age of Onset  . Diabetes Mother   . Cancer - Prostate Father   . Pulmonary embolism Father   . Hypertension Sister   . Diabetes Sister   . Hypertension Brother   . Arrhythmia Brother     afib  . CAD Brother   .  Pulmonary fibrosis Brother     ROS:  General: no fevers/chills/night sweats Eyes: no blurry vision, diplopia, or amaurosis ENT: Positive for hearing loss Resp: no cough, wheezing, or hemoptysis CV: no edema or palpitations GI: no abdominal pain, nausea, vomiting, diarrhea, or constipation GU: no dysuria, frequency, or hematuria Skin: no rash Neuro: no headache, numbness, tingling, or weakness of extremities Musculoskeletal: Positive for right hip pain Heme: no bleeding, DVT, positive for easy bruising Endo: no polydipsia or polyuria  BP 154/68 mmHg  Pulse 55  Ht 5' (1.524 m)  Wt 130 lb 12.8 oz (59.33 kg)  BMI 25.54 kg/m2  PHYSICAL EXAM: Pt is alert and oriented, WD, WN, pleasant elderly woman in no distress. HEENT: normal Neck: JVP normal. Carotid upstrokes delayed with bilateral bruits. No thyromegaly. Lungs: equal expansion, clear bilaterally CV: Apex is discrete and nondisplaced, irregular with late peaking grade 3/6 harsh crescendo decrescendo murmur at the right upper sternal border with absent A2 Abd: soft, NT, +BS, no bruit, no hepatosplenomegaly Back: no CVA tenderness Ext: no C/C/E        DP/PT pulses intact and = Skin: warm and dry without rash Neuro: CNII-XII intact             Strength intact = bilaterally  EKG:  NSR with LBBB  Echo 06/19/2014: Study Conclusions  - Left ventricle: The cavity size was normal. Wall thickness was increased in a pattern of mild LVH. Systolic function was low normal to mildly reduced. The estimated ejection fraction was in the range of 50% to 55%. Wall motion was normal; there were no regional wall motion abnormalities. Features are consistent with a pseudonormal left ventricular filling pattern, with concomitant abnormal relaxation and increased filling pressure (grade 2 diastolic dysfunction). L wave in PW doppler tracing of mitral inflow suggests elevated LV filling pressure. - Aortic valve: Trileaflet;  severely calcified leaflets. There was trivial regurgitation. Mean gradient (S): 71 mm Hg. Valve area (VTI): 0.5 cm^2. - Mitral valve: Severely calcified annulus. Mildly calcified leaflets . There was moderate to severe regurgitation. - Left atrium: The atrium was severely dilated. - Right ventricle: The cavity size was normal. Systolic function was normal. - Tricuspid valve: Peak RV-RA gradient (S): 43 mm Hg. - Systemic veins: The IVC was poorly visualized.  Impressions:  - Normal LV size with mild LV hypertrophy. EF 50-55% (low normal to mildly reduced). Moderate diastolic dysfunction with an L wave in the mitral inflow PW doppler tracing. Severe aortic stenosis. There was moderate to severe MR (potentially could improve with correction of AS). Mild pulmonary hypertension. Normal RV size and systolic function.  STS Risk Calculator  ProcedureAVR  Risk of Mortality9.7% Morbidity or Mortality33.8% Prolonged LOS19.3% Short LOS8.8% Permanent Stroke4.0% Prolonged Vent Support23.7% DSW Infection0.1% Renal Failure10.8% Reoperation11.4%  ASSESSMENT AND PLAN: This is a 80 year old woman with severe, stage D, aortic stenosis. She remains remarkably asymptomatic with respect to her aortic stenosis. In review of her records, she was hospitalized earlier this year with shortness of breath and dry cough, was diagnosed with community-acquired pneumonia, but had a significantly elevated proBNP greater than 3000. She has critical aortic stenosis based on gradients and estimated valve area. We had a lengthy discussion about implications of severe  aortic stenosis, including natural history and prognosis. We reviewed specific treatment options in the context of her advanced age and specific medical comorbidities. Options include observation/medical therapy, TAVR, and conventional surgical AVR. The patient is not currently interested in pursuing treatment of her aortic stenosis. This is certainly understandable considering her advanced age and lack of symptoms. She understands that she will likely develop progressive symptoms of shortness of breath and even potentially develop congestive heart failure. She was counseled about potential symptoms of shortness of breath, chest discomfort, and dizziness/syncope. She will continue to follow regularly with Dr. [name]. I would be happy to see her back if symptoms arise or if she changes her mind about seeking treatment. We reviewed the expected course and potential complications that could occur with TAVR so that she could keep this in consideration.  [name], MD 06/27/2014 3:33 PM

## 2014-07-04 ENCOUNTER — Other Ambulatory Visit: Payer: Self-pay | Admitting: *Deleted

## 2014-07-04 MED ORDER — LISINOPRIL 20 MG PO TABS: 20.0000 mg | ORAL_TABLET | Freq: Two times a day (BID) | ORAL | Status: DC

## 2014-07-10 ENCOUNTER — Ambulatory Visit (INDEPENDENT_AMBULATORY_CARE_PROVIDER_SITE_OTHER): Payer: Medicare Other

## 2014-07-10 DIAGNOSIS — I4891 Unspecified atrial fibrillation: Secondary | ICD-10-CM | POA: Diagnosis not present

## 2014-07-10 DIAGNOSIS — Z5181 Encounter for therapeutic drug level monitoring: Secondary | ICD-10-CM | POA: Diagnosis not present

## 2014-07-10 LAB — POCT INR: INR: 3

## 2014-08-10 ENCOUNTER — Ambulatory Visit (INDEPENDENT_AMBULATORY_CARE_PROVIDER_SITE_OTHER): Payer: Medicare Other | Admitting: Cardiology

## 2014-08-10 ENCOUNTER — Ambulatory Visit (INDEPENDENT_AMBULATORY_CARE_PROVIDER_SITE_OTHER): Payer: Medicare Other | Admitting: Pharmacist

## 2014-08-10 ENCOUNTER — Encounter: Payer: Self-pay | Admitting: Cardiology

## 2014-08-10 VITALS — BP 140/80 | HR 70 | Ht 60.0 in | Wt 130.0 lb

## 2014-08-10 DIAGNOSIS — R600 Localized edema: Secondary | ICD-10-CM

## 2014-08-10 DIAGNOSIS — I4891 Unspecified atrial fibrillation: Secondary | ICD-10-CM | POA: Diagnosis not present

## 2014-08-10 DIAGNOSIS — I10 Essential (primary) hypertension: Secondary | ICD-10-CM | POA: Diagnosis not present

## 2014-08-10 DIAGNOSIS — I350 Nonrheumatic aortic (valve) stenosis: Secondary | ICD-10-CM | POA: Diagnosis not present

## 2014-08-10 DIAGNOSIS — I480 Paroxysmal atrial fibrillation: Secondary | ICD-10-CM | POA: Diagnosis not present

## 2014-08-10 DIAGNOSIS — Z5181 Encounter for therapeutic drug level monitoring: Secondary | ICD-10-CM

## 2014-08-10 DIAGNOSIS — R609 Edema, unspecified: Secondary | ICD-10-CM

## 2014-08-10 DIAGNOSIS — Z7901 Long term (current) use of anticoagulants: Secondary | ICD-10-CM

## 2014-08-10 LAB — TSH: TSH: 0.85 u[IU]/mL (ref 0.35–4.50)

## 2014-08-10 LAB — POCT INR: INR: 1.5

## 2014-08-10 NOTE — Patient Instructions (Signed)
Your physician recommends that you have lab work TODAY (TSH)  Your physician has recommended that you have a pulmonary function test. Pulmonary Function Tests are a group of tests that measure how well air moves in and out of your lungs.  Your physician wants you to follow-up in: 6 months with Dr. [name]. You will receive a reminder letter in the mail two months in advance. If you don't receive a letter, please call our office to schedule the follow-up appointment.

## 2014-08-10 NOTE — Progress Notes (Signed)
[place], [place]  [address] Phone: [phone] Fax:  [phone]  Date:  08/10/2014   ID:  Alexa Mcdonald, DOB [birth year], MRN [id number]  PCP:  [name], MD  Cardiologist:  Fransico Him, MD    History of Present Illness: Alexa Mcdonald is a 81 y.o. female with a history of severe AS medically managed, HTN, PAF on chronic anticoagulation and chronic LE edema. She is doing well. She denies any chest pain or pressure, SOB, DOE, dizziness, palpitations or syncope. She has chronic LE edema which has been controlled.  She is still quite active and is able to do her daily activities and shop without any SOB or chest pain.  She remains asymptomatic. She is still active at home doing her daily activities and goes out shopping   Wt Readings from Last 3 Encounters:  08/10/14 130 lb (58.968 kg)  06/27/14 130 lb 12.8 oz (59.33 kg)  03/27/14 128 lb (58.06 kg)     Past Medical History  Diagnosis Date  . Mediastinal goiter     removed 1988  . CKD (chronic kidney disease)     stage III  . Osteopenia   . Osteoporosis   . Vitamin D deficiency   . Paroxysmal atrial fibrillation   . Hypertension   . LBBB (left bundle branch block)   . Aortic valve sclerosis   . Hx of orthostatic hypotension   . Colon, diverticulosis   . Tachycardia-bradycardia   . Diabetes mellitus     diet controlled  . Esophageal reflux   . Aortic stenosis, severe 04/2013  . GERD (gastroesophageal reflux disease)     Current Outpatient Prescriptions  Medication Sig Dispense Refill  . amiodarone (PACERONE) 200 MG tablet Take 3/4 of a tablet by mouth once daily    . Calcium Carb-Cholecalciferol (CALCIUM 600/VITAMIN D3 PO) Take 1 tablet by mouth 2 (two) times daily.    [name] lisinopril (PRINIVIL,ZESTRIL) 20 MG tablet Take 1 tablet (20 mg total) by mouth 2 (two) times daily. 60 tablet 11  . methimazole (TAPAZOLE) 10 MG tablet Take 5 mg by mouth daily.     . pravastatin (PRAVACHOL) 10 MG  tablet Take 10 mg by mouth at bedtime.     [name] warfarin (COUMADIN) 2.5 MG tablet Take as directed by [facility] 45 tablet 3  . acetaminophen (TYLENOL) 500 MG tablet Take 500 mg by mouth every 6 (six) hours as needed for moderate pain.     No current facility-administered medications for this visit.    Allergies:    Allergies  Allergen Reactions  . Amoxicillin Other (See Comments)    tongue felt bruised.  . Celebrex [Celecoxib] Other (See Comments)    Mouth sores    Social History:  The patient  reports that she has never smoked. She has never used smokeless tobacco. She reports that she does not drink alcohol or use illicit drugs.   Family History:  The patient's family history includes Arrhythmia in her brother; CAD in her brother; Cancer - Prostate in her father; Diabetes in her mother and sister; Hypertension in her brother and sister; Pulmonary embolism in her father; Pulmonary fibrosis in her brother.   ROS:  Please see the history of present illness.      All other systems reviewed and negative.   PHYSICAL EXAM: VS:  BP 140/80 mmHg  Pulse 70  Ht 5' (1.524 m)  Wt 130 lb (58.968 kg)  BMI 25.39 kg/m2 Well  nourished, well developed, in no acute distress HEENT: normal Neck: no JVD Cardiac:  normal S1, S2; RRR; 2/6 late peaking systolic murmur with no S2 Lungs:  clear to auscultation bilaterally, no wheezing, rhonchi or rales Abd: soft, nontender, no hepatomegaly Ext: no edema Skin: warm and dry Neuro:  CNs 2-12 intact, no focal abnormalities noted    ASSESSMENT AND PLAN:  1.  Severe to critical AS - she is completely asymptomatic- repeat echo now showed progression of her AS with a mean SVG of 53mmHg. She does not want to consider open heart surgery but we have talked about TAVR and she met with Dr. [name] but is not interested at this time.   2.  HTN-  - continue Lisinopril  3. Chronic LE edema - controlled on no diuretic 4. PAF- maintaining NSR on Amio - continue  amio and warfarin - check TSH  - check PFTs       5.  Chronic systemic anticoagulation  Followup with me in 6 months  Signed, Fransico Him, MD [facility] 08/10/2014 3:10 PM

## 2014-08-31 ENCOUNTER — Ambulatory Visit (INDEPENDENT_AMBULATORY_CARE_PROVIDER_SITE_OTHER): Payer: Medicare Other | Admitting: *Deleted

## 2014-08-31 DIAGNOSIS — I480 Paroxysmal atrial fibrillation: Secondary | ICD-10-CM | POA: Diagnosis not present

## 2014-08-31 DIAGNOSIS — Z5181 Encounter for therapeutic drug level monitoring: Secondary | ICD-10-CM | POA: Diagnosis not present

## 2014-08-31 DIAGNOSIS — I4891 Unspecified atrial fibrillation: Secondary | ICD-10-CM | POA: Diagnosis not present

## 2014-08-31 LAB — POCT INR: INR: 2.6

## 2014-09-19 DIAGNOSIS — E780 Pure hypercholesterolemia: Secondary | ICD-10-CM | POA: Diagnosis not present

## 2014-09-19 DIAGNOSIS — N189 Chronic kidney disease, unspecified: Secondary | ICD-10-CM | POA: Diagnosis not present

## 2014-09-19 DIAGNOSIS — I10 Essential (primary) hypertension: Secondary | ICD-10-CM | POA: Diagnosis not present

## 2014-09-19 DIAGNOSIS — I471 Supraventricular tachycardia: Secondary | ICD-10-CM | POA: Diagnosis not present

## 2014-09-19 DIAGNOSIS — I352 Nonrheumatic aortic (valve) stenosis with insufficiency: Secondary | ICD-10-CM | POA: Diagnosis not present

## 2014-09-19 DIAGNOSIS — I481 Persistent atrial fibrillation: Secondary | ICD-10-CM | POA: Diagnosis not present

## 2014-09-19 DIAGNOSIS — M810 Age-related osteoporosis without current pathological fracture: Secondary | ICD-10-CM | POA: Diagnosis not present

## 2014-09-19 DIAGNOSIS — G5602 Carpal tunnel syndrome, left upper limb: Secondary | ICD-10-CM | POA: Diagnosis not present

## 2014-09-19 DIAGNOSIS — E559 Vitamin D deficiency, unspecified: Secondary | ICD-10-CM | POA: Diagnosis not present

## 2014-09-19 DIAGNOSIS — N183 Chronic kidney disease, stage 3 (moderate): Secondary | ICD-10-CM | POA: Diagnosis not present

## 2014-09-19 DIAGNOSIS — E1122 Type 2 diabetes mellitus with diabetic chronic kidney disease: Secondary | ICD-10-CM | POA: Diagnosis not present

## 2014-09-19 DIAGNOSIS — E032 Hypothyroidism due to medicaments and other exogenous substances: Secondary | ICD-10-CM | POA: Diagnosis not present

## 2014-09-21 ENCOUNTER — Ambulatory Visit (INDEPENDENT_AMBULATORY_CARE_PROVIDER_SITE_OTHER): Payer: Medicare Other | Admitting: Internal Medicine

## 2014-09-21 ENCOUNTER — Ambulatory Visit (INDEPENDENT_AMBULATORY_CARE_PROVIDER_SITE_OTHER): Payer: Medicare Other | Admitting: *Deleted

## 2014-09-21 DIAGNOSIS — Z5181 Encounter for therapeutic drug level monitoring: Secondary | ICD-10-CM

## 2014-09-21 DIAGNOSIS — I480 Paroxysmal atrial fibrillation: Secondary | ICD-10-CM

## 2014-09-21 DIAGNOSIS — I4891 Unspecified atrial fibrillation: Secondary | ICD-10-CM | POA: Diagnosis not present

## 2014-09-21 LAB — PULMONARY FUNCTION TEST
DL/VA % pred: 99 %
DL/VA: 4.22 ml/min/mmHg/L
DLCO unc % pred: 63 %
DLCO unc: 11.92 ml/min/mmHg
FEF 25-75 Post: 2.34 L/sec
FEF 25-75 Pre: 2.4 L/sec
FEF2575-%Change-Post: -2 %
FEF2575-%Pred-Post: 404 %
FEF2575-%Pred-Pre: 413 %
FEV1-%Change-Post: 1 %
FEV1-%Pred-Post: 133 %
FEV1-%Pred-Pre: 131 %
FEV1-Post: 1.55 L
FEV1-Pre: 1.53 L
FEV1FVC-%Change-Post: 3 %
FEV1FVC-%Pred-Pre: 124 %
FEV6-%Change-Post: -1 %
FEV6-%Pred-Post: 116 %
FEV6-%Pred-Pre: 117 %
FEV6-Post: 1.71 L
FEV6-Pre: 1.73 L
FEV6FVC-%Pred-Post: 109 %
FEV6FVC-%Pred-Pre: 109 %
FVC-%Change-Post: -1 %
FVC-%Pred-Post: 106 %
FVC-%Pred-Pre: 107 %
FVC-Post: 1.71 L
FVC-Pre: 1.73 L
Post FEV1/FVC ratio: 91 %
Post FEV6/FVC ratio: 100 %
Pre FEV1/FVC ratio: 88 %
Pre FEV6/FVC Ratio: 100 %

## 2014-09-21 LAB — POCT INR: INR: 2.8

## 2014-09-21 NOTE — Progress Notes (Signed)
PFT performed today. 

## 2014-09-25 ENCOUNTER — Telehealth: Payer: Self-pay

## 2014-09-25 DIAGNOSIS — R942 Abnormal results of pulmonary function studies: Secondary | ICD-10-CM

## 2014-09-25 NOTE — Telephone Encounter (Signed)
Patient informed of results and verbal understanding expressed.  Pulmonary referral placed for scheduling.

## 2014-09-25 NOTE — Telephone Encounter (Signed)
-----   Message from Sueanne Margarita, MD sent at 09/21/2014  8:53 PM EST ----- Moderate diffusion defect on PFTS - Patient is on Amio - please refer to pulmonary for further evaluation

## 2014-10-05 ENCOUNTER — Other Ambulatory Visit: Payer: Self-pay | Admitting: Pharmacist

## 2014-10-05 MED ORDER — WARFARIN SODIUM 2.5 MG PO TABS: ORAL_TABLET | ORAL | Status: DC

## 2014-10-19 ENCOUNTER — Ambulatory Visit (INDEPENDENT_AMBULATORY_CARE_PROVIDER_SITE_OTHER): Payer: Medicare Other

## 2014-10-19 DIAGNOSIS — I4891 Unspecified atrial fibrillation: Secondary | ICD-10-CM | POA: Diagnosis not present

## 2014-10-19 DIAGNOSIS — Z5181 Encounter for therapeutic drug level monitoring: Secondary | ICD-10-CM

## 2014-10-19 LAB — POCT INR: INR: 2.6

## 2014-10-30 ENCOUNTER — Other Ambulatory Visit (INDEPENDENT_AMBULATORY_CARE_PROVIDER_SITE_OTHER): Payer: Medicare Other

## 2014-10-30 ENCOUNTER — Encounter: Payer: Self-pay | Admitting: Pulmonary Disease

## 2014-10-30 ENCOUNTER — Ambulatory Visit (INDEPENDENT_AMBULATORY_CARE_PROVIDER_SITE_OTHER): Payer: Medicare Other | Admitting: Pulmonary Disease

## 2014-10-30 ENCOUNTER — Ambulatory Visit (INDEPENDENT_AMBULATORY_CARE_PROVIDER_SITE_OTHER)
Admission: RE | Admit: 2014-10-30 | Discharge: 2014-10-30 | Disposition: A | Discharge status: Home / Self Care | Payer: Medicare Other | Source: Ambulatory Visit | Attending: Pulmonary Disease | Admitting: Pulmonary Disease

## 2014-10-30 VITALS — BP 144/70 | HR 52 | Temp 97.0°F | Ht 60.0 in | Wt 130.8 lb

## 2014-10-30 DIAGNOSIS — R942 Abnormal results of pulmonary function studies: Secondary | ICD-10-CM

## 2014-10-30 DIAGNOSIS — E049 Nontoxic goiter, unspecified: Secondary | ICD-10-CM | POA: Diagnosis not present

## 2014-10-30 DIAGNOSIS — J849 Interstitial pulmonary disease, unspecified: Secondary | ICD-10-CM | POA: Insufficient documentation

## 2014-10-30 LAB — SEDIMENTATION RATE: Sed Rate: 23 mm/hr — ABNORMAL HIGH (ref 0–22)

## 2014-10-30 IMAGING — CR DG CHEST 2V
2 series · 2 of 2 positions shown · non-contrast
Comparison: [DATE]

CLINICAL DATA: Abnormal pulmonary function tests.

EXAM:
CHEST  2 VIEW

[view not recorded (1 of 2)]
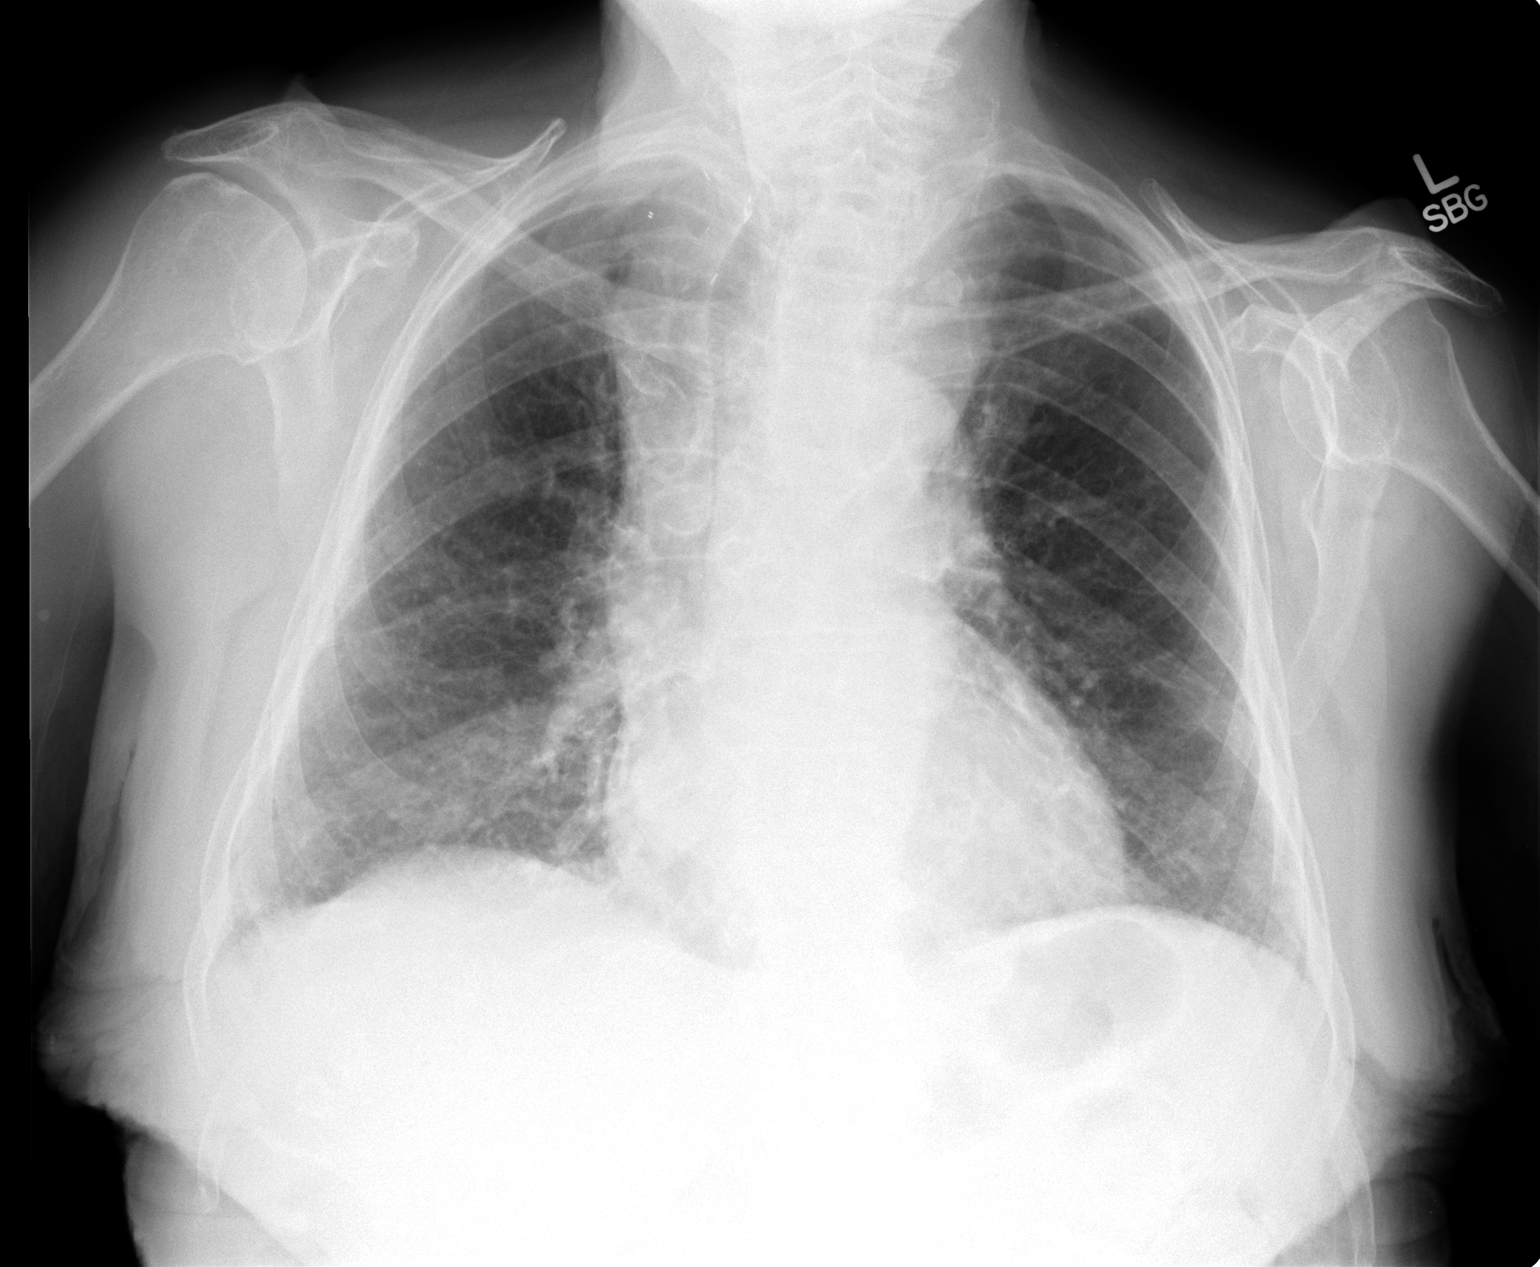

[view not recorded (2 of 2)]
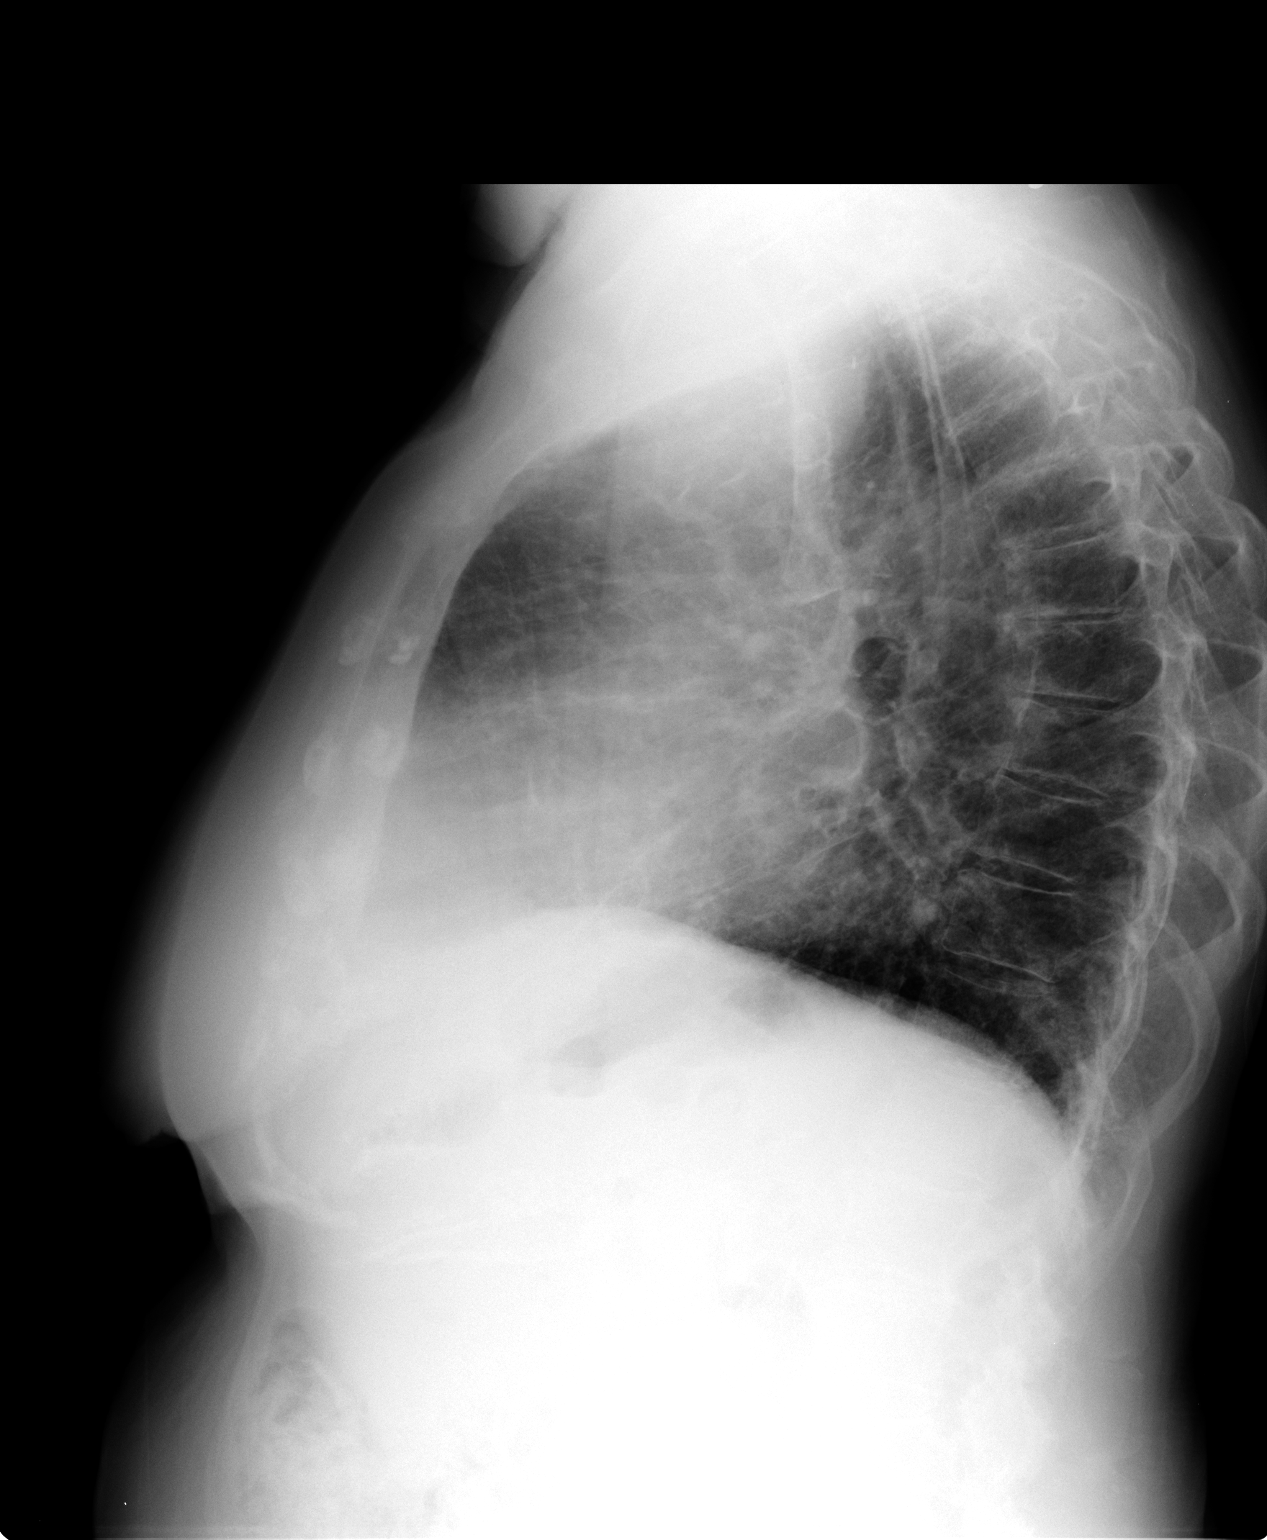

[2 of 2 positions shown; findings below may reference images not displayed]

FINDINGS: No cardiomegaly. There is a known goiter with dominant left lobe
mass which displaces the trachea to the right.

There is interstitial coarsening which is mildly basilar and
subpleural predominance. Lung volumes are within normal limits. No
pneumonia, edema, effusion, or pneumothorax.
IMPRESSION: 1. Mild reticular markings in the lower lungs, cannot exclude an
interstitial lung disease.
2. Goiter with chronic substernal extension and tracheal
displacement.

## 2014-10-30 NOTE — Progress Notes (Signed)
   Subjective:    Patient ID: Alexa Mcdonald, female    DOB: August 01, 1921, 81 y.o.   MRN: [id number]  HPI The patient is a 81 year old female who I've been asked to see for abnormal pulmonary function studies. She has a history of PAF, and is on amiodarone for control for years according to the patient. She has had recent pulmonary function studies that showed no airflow obstruction, severe restriction, and a diffusion capacity that was mildly reduced but corrects to normal with alveolar volume adjustment. The patient also has known moderate to severe mitral regurgitation, and severe aortic stenosis by her most recent echocardiogram. Surprisingly, she denies any significant shortness of breath, but does not push herself from a physical standpoint. She is very active on a daily basis, and feels that her breathing is no different than one year ago. She denies shortness of breath with a flight of stairs, bringing groceries in from the car, or making a bed. She is more limited by leg weakness than she is breathing. Denies any cough or mucus production. She denies any history of spine deformities, but has lost height over the years. Her last chest x-ray was in May of last year, and did show prominent bronchovascular markings, but this is often seen in valvular heart disease. Has a history of GERD, but denies any issues with aspiration.   Review of Systems  Constitutional: Negative for fever and unexpected weight change.  HENT: Negative for congestion, dental problem, ear pain, nosebleeds, postnasal drip, rhinorrhea, sinus pressure, sneezing, sore throat and trouble swallowing.   Eyes: Negative for redness and itching.  Respiratory: Negative for cough, chest tightness, shortness of breath and wheezing.   Cardiovascular: Positive for palpitations. Negative for leg swelling.  Gastrointestinal: Negative for nausea and vomiting.  Genitourinary: Negative for dysuria.  Musculoskeletal: Negative for joint  swelling.  Skin: Negative for rash.  Neurological: Negative for headaches.  Hematological: Does not bruise/bleed easily.  Psychiatric/Behavioral: Negative for dysphoric mood. The patient is not nervous/anxious.        Objective:   Physical Exam Constitutional:  Well developed, no acute distress  HENT:  Nares patent without discharge  Oropharynx without exudate, palate and uvula are normal  Eyes:  Perrla, eomi, no scleral icterus  Neck:  No JVD, no TMG  Cardiovascular:  sounds regular today, no rubs or gallops.  3/6 blowing murmur        Intact distal pulses but decreased  Pulmonary :  Normal breath sounds, no stridor or respiratory distress   No rhonchi, or wheezing.  Faint crackles in bases  Abdominal:  Soft, nondistended, bowel sounds present.  No tenderness noted.   Musculoskeletal:  No lower extremity edema noted.  Lymph Nodes:  No cervical lymphadenopathy noted  Skin:  No cyanosis noted  Neurologic:  Alert, appropriate, moves all 4 extremities without obvious deficit.         Assessment & Plan:

## 2014-10-30 NOTE — Assessment & Plan Note (Signed)
The patient has severe restriction on her lung volumes, and also a mild reduction in her diffusion capacity that corrects to normal with alveolar volume adjustment. She is currently on amiodarone for her atrial fibrillation, and the question is raised whether this may be an issue for her from a pulmonary standpoint. Her chest x-ray from last year does show prominent bronchovascular markings, but no definite interstitial lung disease. This is commonly seen in patients with significant mitral valve disease. At this point, I would like to repeat her chest x-ray, and will also check a sedimentation rate. Her restriction may simply be due to her mild centripetal obesity and kyphosis on exam. I also wonder whether she may have had a little bit of difficulty with the test based on some of the other numbers on the report. If her chest x-ray and sedimentation rate are unremarkable, I would probably continue her on amiodarone for now since she is asymptomatic and repeat her PFTs in 6-12 months.

## 2014-10-30 NOTE — Patient Instructions (Signed)
Will check chest xray today, as well as a blood test that lets [place] know about inflammation.  Will call you with the results. Continue to stay active.

## 2014-10-30 NOTE — Progress Notes (Signed)
Please order a high resolution chest CT to rule out interstitial lung disease - this was recommended by Dr. Gwenette Greet to rule out amio toxicity

## 2014-10-31 ENCOUNTER — Telehealth: Payer: Self-pay

## 2014-10-31 DIAGNOSIS — R942 Abnormal results of pulmonary function studies: Secondary | ICD-10-CM

## 2014-10-31 NOTE — Telephone Encounter (Signed)
Patient agrees with Dr. Gwenette Greet and Dr. Theodosia Blender recommendations for chest CTA. Ordered for scheduling.

## 2014-10-31 NOTE — Telephone Encounter (Signed)
-----  Message from Sueanne Margarita, MD sent at 10/30/2014  7:57 PM EDT ----- Please put ordering MD for ordering chest CTA as Dr. Gwenette Greet ----- Message -----    From: Kathee Delton, MD    Sent: 10/30/2014   6:27 PM      To: Sueanne Margarita, MD  Would be happy to order and follow up  ----- Message -----    From: Sueanne Margarita, MD    Sent: 10/30/2014   6:04 PM      To: Kathee Delton, MD  Thanks - I'm going to get the CT ----- Message -----    From: Kathee Delton, MD    Sent: 10/30/2014   5:55 PM      To: Sueanne Margarita, MD  Her cxr appears more like the chronic bronchovascular markings often seen in pts with valvular heart disease, although I cannot exclude an ISLD.  ESR not impressive, but not always elevated in chronic amiodarone pulmonary toxicity.  We could do high resolution ct to better characterize her lung process, and proceed from there if you feel amiodarone is the best drug for her.  On the other hand, if you feel there are other good alternatives out there, could simply change meds, and wouldn't worry about further workup at this point since she is not overly symptomatic. Let me know what you think.   ----- Message -----    From: Sueanne Margarita, MD    Sent: 10/30/2014   5:45 PM      To: Kathee Delton, MD  Sed rate not very high but chest xray with ? ILD - ok to continue Amio? ----- Message -----    From: Kathee Delton, MD    Sent: 10/30/2014  10:13 AM      To: Sueanne Margarita, MD

## 2014-11-03 ENCOUNTER — Ambulatory Visit (INDEPENDENT_AMBULATORY_CARE_PROVIDER_SITE_OTHER)
Admission: RE | Admit: 2014-11-03 | Discharge: 2014-11-03 | Disposition: A | Discharge status: Home / Self Care | Payer: Medicare Other | Source: Ambulatory Visit | Attending: Pulmonary Disease | Admitting: Pulmonary Disease

## 2014-11-03 DIAGNOSIS — I2510 Atherosclerotic heart disease of native coronary artery without angina pectoris: Secondary | ICD-10-CM | POA: Diagnosis not present

## 2014-11-03 DIAGNOSIS — R942 Abnormal results of pulmonary function studies: Secondary | ICD-10-CM

## 2014-11-03 DIAGNOSIS — I700 Atherosclerosis of aorta: Secondary | ICD-10-CM | POA: Diagnosis not present

## 2014-11-03 IMAGING — CT CT CHEST HIGH RESOLUTION W/O CM
2 of 6 series · 8 of 36 positions shown, 10 images · non-contrast
Comparison: Chest CT [DATE].

CLINICAL DATA: [AGE] female with abnormal pulmonary function
tests. Evaluate for interstitial lung disease.

EXAM:
CT CHEST WITHOUT CONTRAST
TECHNIQUE: Multidetector CT imaging of the chest was performed following the
standard protocol without intravenous contrast. High resolution
imaging of the lungs, as well as inspiratory and expiratory imaging,
was performed.

[Series 8: hires retro entire lungs · axial · 0.66mm/px · z∈[-43,+117]mm · 5 of 25 slices shown, 7 images]
[im 5/25  mediastinal]
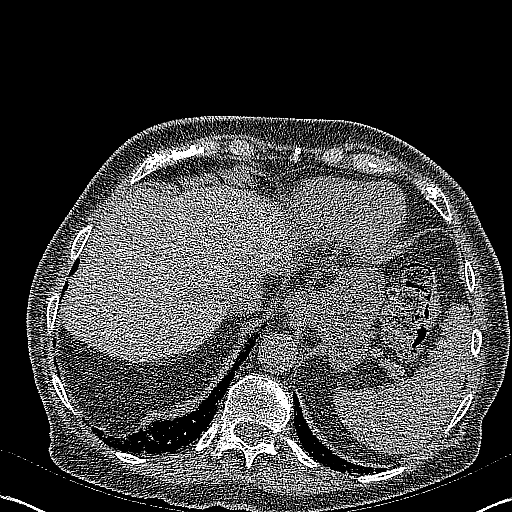
[im 5/25  lung]
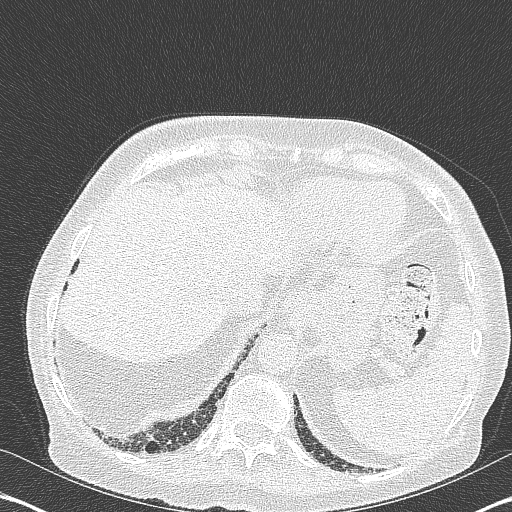
[im 9/25  lung]
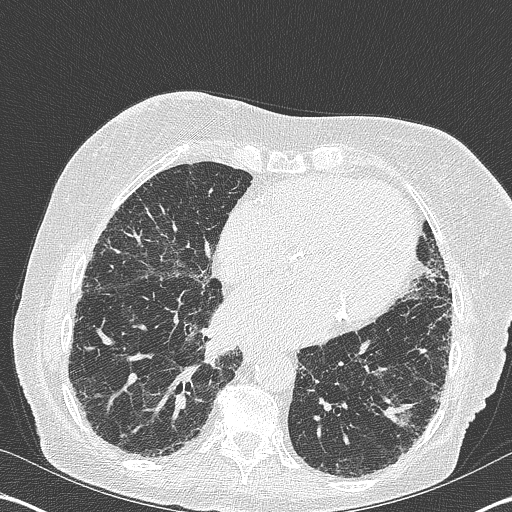
[im 13/25  lung]
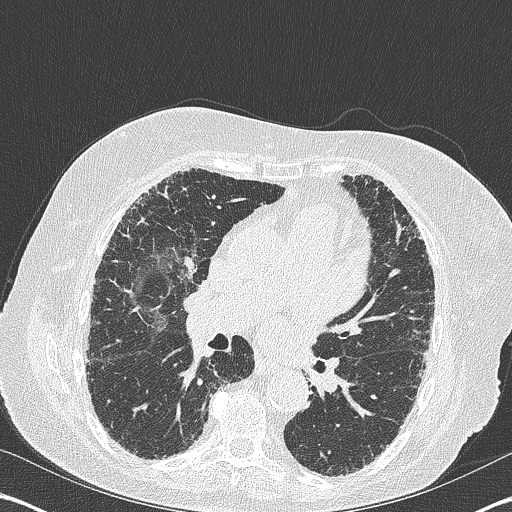
[im 17/25  lung]
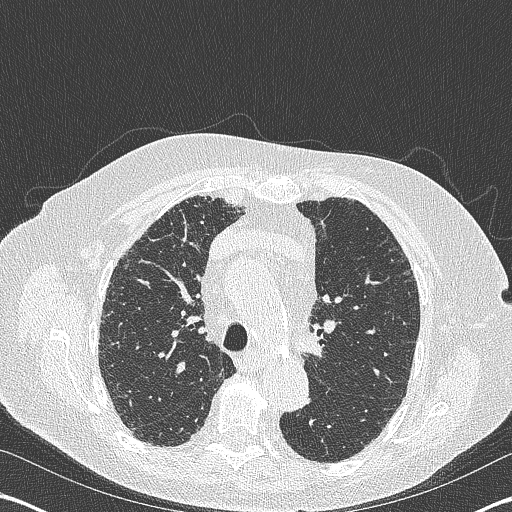
[im 21/25  mediastinal]
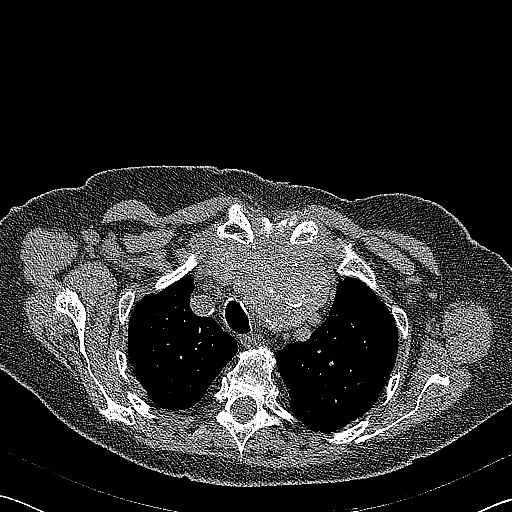
[im 21/25  lung]
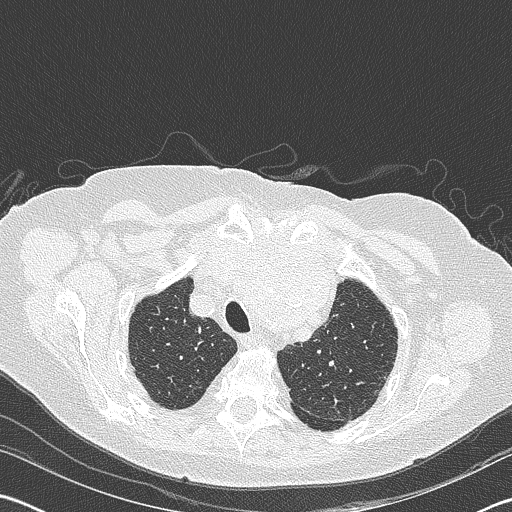

[Series 602: <mpr thick range> · coronal · 0.66mm/px · 3 of 133 slices shown]
[im 27/133  lung]
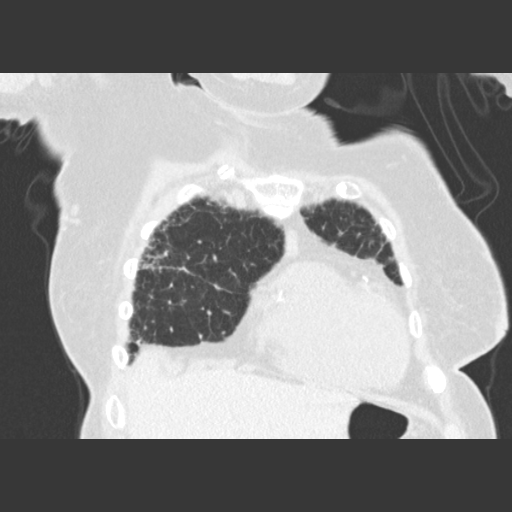
[im 53/133  lung]
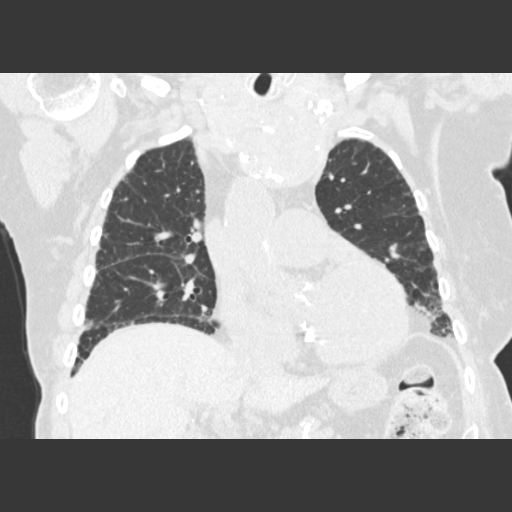
[im 80/133  lung]
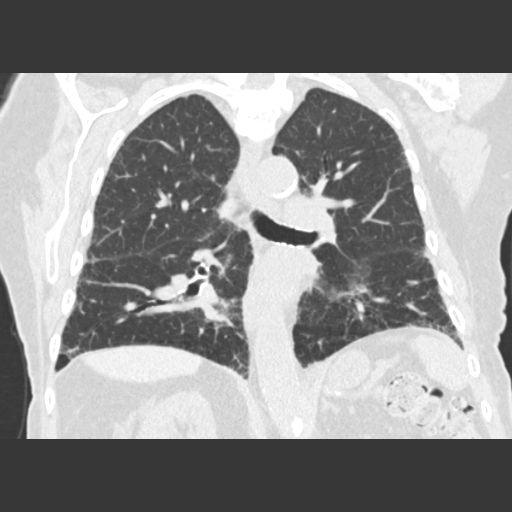

[8 of 36 positions shown; findings below may reference images not displayed]

FINDINGS: Mediastinum/Lymph Nodes: Heart size is normal. There is no
significant pericardial fluid, thickening or pericardial
calcification. There is atherosclerosis of the thoracic aorta, the
great vessels of the mediastinum and the coronary arteries,
including calcified atherosclerotic plaque in the left main, left
anterior descending, left circumflex and right coronary arteries.
Severe calcifications of the aortic valve and mitral annulus. No
pathologically enlarged mediastinal or hilar lymph nodes. Please
note that accurate exclusion of hilar adenopathy is limited on
noncontrast CT scans. Esophagus is unremarkable in appearance.
Markedly enlarged and heterogeneous appearing thyroid gland, it
asymmetric (left lobe is much larger than the right), with the left
lobe measuring up to 5.7 x 6.4 cm, presumably a a large goiter. No
axillary lymphadenopathy.

Lungs/Pleura: High-resolution images demonstrate extensive areas of
subpleural reticulation, parenchymal banding, mild traction
bronchiectasis, and occasional areas of early honeycombing, which
have a definitive craniocaudal gradient. These findings have clearly
progressed compared to prior examination [DATE]. Inspiratory and
expiratory imaging demonstrates minimal air trapping. No acute
consolidative airspace disease. No pleural effusions. No suspicious
appearing pulmonary nodules or masses.

Upper Abdomen: Atherosclerosis.  Otherwise, unremarkable.

Musculoskeletal/Soft Tissues: There are no aggressive appearing
lytic or blastic lesions noted in the visualized portions of the
skeleton.
IMPRESSION: 1. The appearance of the lungs is compatible with interstitial lung
disease, and based on the strong craniocaudal gradient, progression
of findings compared to prior study from [DATE], and the
presence of honeycombing, this is favored to reflect early usual
interstitial pneumonia (UIP).
2. Atherosclerosis, including left main and 3 vessel coronary artery
disease.
3. There are calcifications of the aortic valve and mitral annulus.
Echocardiographic correlation for evaluation of potential valvular
dysfunction may be warranted if clinically indicated.
4. Markedly enlarged heterogeneous appearing thyroid gland, most
compatible with a multinodular goiter.
5. Additional incidental findings, as above.

## 2014-11-06 ENCOUNTER — Telehealth: Payer: Self-pay | Admitting: *Deleted

## 2014-11-06 NOTE — Telephone Encounter (Signed)
Pt advised, verbalized understanding  to stop amiodarone and schedule appt with Dr [name] in 2 months. 3  I will send message to [facility] to contact pt to schedule appt in 2 months with Dr [name].

## 2014-11-06 NOTE — Telephone Encounter (Signed)
-----   Message from Sueanne Margarita, MD sent at 11/06/2014  8:59 AM EDT ----- Will do thanks Lanny Hurst.    Katy,  Please have patient stop Amiodarone and followup with me in 2 months  Traci Turner ----- Message -----    From: Kathee Delton, MD    Sent: 11/06/2014   8:55 AM      To: Sueanne Margarita, MD  Traci, This pts CT chest shows some very mild ISLD.  The pattern MAY be c/w UIP, but I cannot exclude that amiodarone is or is not playing a role.  I suspect we need to stop the medication if possible, and see how she does.  I will see her back in 8 weeks. If no change, will check more extensive autoimmune panel for possible cause of her abnormal findings.

## 2014-11-27 ENCOUNTER — Ambulatory Visit (INDEPENDENT_AMBULATORY_CARE_PROVIDER_SITE_OTHER): Payer: Medicare Other | Admitting: *Deleted

## 2014-11-27 ENCOUNTER — Ambulatory Visit (INDEPENDENT_AMBULATORY_CARE_PROVIDER_SITE_OTHER): Payer: Medicare Other | Admitting: Nurse Practitioner

## 2014-11-27 ENCOUNTER — Encounter: Payer: Self-pay | Admitting: Nurse Practitioner

## 2014-11-27 VITALS — BP 172/64 | HR 56 | Ht 60.5 in | Wt 128.8 lb

## 2014-11-27 DIAGNOSIS — I4819 Other persistent atrial fibrillation: Secondary | ICD-10-CM | POA: Insufficient documentation

## 2014-11-27 DIAGNOSIS — I10 Essential (primary) hypertension: Secondary | ICD-10-CM

## 2014-11-27 DIAGNOSIS — I4891 Unspecified atrial fibrillation: Secondary | ICD-10-CM

## 2014-11-27 DIAGNOSIS — E785 Hyperlipidemia, unspecified: Secondary | ICD-10-CM

## 2014-11-27 DIAGNOSIS — I340 Nonrheumatic mitral (valve) insufficiency: Secondary | ICD-10-CM | POA: Insufficient documentation

## 2014-11-27 DIAGNOSIS — I480 Paroxysmal atrial fibrillation: Secondary | ICD-10-CM

## 2014-11-27 DIAGNOSIS — I350 Nonrheumatic aortic (valve) stenosis: Secondary | ICD-10-CM

## 2014-11-27 DIAGNOSIS — Z5181 Encounter for therapeutic drug level monitoring: Secondary | ICD-10-CM

## 2014-11-27 LAB — POCT INR: INR: 2

## 2014-11-27 NOTE — Patient Instructions (Signed)
Medication Instructions:  Your physician recommends that you continue on your current medications as directed. Please refer to the Current Medication list given to you today.   Labwork: NONE  Testing/Procedures: NONE  Follow-Up: Your physician recommends that you schedule a follow-up appointment in: 3 months with Dr. [name]   Any Other Special Instructions Will Be Listed Below (If Applicable).

## 2014-11-27 NOTE — Progress Notes (Signed)
Patient Name: Alexa Mcdonald Date of Encounter: 11/27/2014  Primary Care Provider:  [name], MD Primary Cardiologist:  T. Turner, MD   Chief Complaint  81 year old female with a history of paroxysmal atrial fibrillation and severe aortic stenosis who presents for follow-up after recent discontinuation of amiodarone related to abnormal PFTs.  Past Medical History   Past Medical History  Diagnosis Date  . Mediastinal goiter     a. 1988: s/p resection.  . CKD (chronic kidney disease), stage III   . Osteopenia   . Osteoporosis   . Vitamin D deficiency   . Paroxysmal atrial fibrillation     a. CHA2DS2VASc = 5-->coumadin;  b. previously on amio->d/c'd 11/2014 secondary to concern for possible amio lung.  . Essential hypertension   . LBBB (left bundle branch block)   . Severe aortic stenosis     a. 06/2014 Echo: EF 50-55%, normal wall motion, Gr 2 DD, triv AI, Sev AS (mean grad 49mmHg, valve area - 0.5cm^2 VTI), mod-sev MR, sev dil LA.  [name] Hx of orthostatic hypotension   . Colon, diverticulosis   . Tachycardia-bradycardia   . Diabetes mellitus     diet controlled  . Esophageal reflux   . GERD (gastroesophageal reflux disease)   . Mitral regurgitation     a. 06/2014 Echo: mod-severe MR.  . 1St degree AV block    Past Surgical History  Procedure Laterality Date  . Cataract extraction    . Carpal tunnel release    . Mri  08/18/07    head (diabetes heart study at [facility])  . Mediastinal removal of a goiter    . Cesarean section      x3    Allergies  Allergies  Allergen Reactions  . Amoxicillin Other (See Comments)    tongue felt bruised.  . Celebrex [Celecoxib] Other (See Comments)    Mouth sores    HPI  81 year old female with the above complex problems. She is a history of severe aortic stenosis and has previously been seen in TAVR clinic. She is not currently interested in pursuing TAVR.  She also has a history of paroxysmal atrial fibrillation and had  previously been on amiodarone. She recently had PFTs showing severe restriction and mildly reduced diffusion capacity. CT was subsequently performed and suggested interstitial lung disease possibly reflecting usual interstitial pneumonia. In this setting, recommendation was made to discontinue amiodarone and this was subsequently done. She remains on Coumadin anticoagulation. Since discontinuation of amiodarone, she has not had any increase in palpitations. Further, she denies any chest discomfort, dyspnea, presyncope, or syncope. She has not had any PND, orthopnea, or edema.  Home Medications  Prior to Admission medications   Medication Sig Start Date End Date Taking? Authorizing Provider  acetaminophen (TYLENOL) 500 MG tablet Take 500 mg by mouth every 6 (six) hours as needed for moderate pain.   Yes Historical Provider, MD  Calcium Carb-Cholecalciferol (CALCIUM 600/VITAMIN D3 PO) Take 1 tablet by mouth 2 (two) times daily.   Yes Historical Provider, MD  lisinopril (PRINIVIL,ZESTRIL) 20 MG tablet Take 1 tablet (20 mg total) by mouth 2 (two) times daily. 07/04/14  Yes [name], MD  methimazole (TAPAZOLE) 5 MG tablet Take 5 mg by mouth daily.   Yes Historical Provider, MD  pravastatin (PRAVACHOL) 10 MG tablet Take 10 mg by mouth at bedtime.    Yes Historical Provider, MD  warfarin (COUMADIN) 2.5 MG tablet Take as directed by [facility] 10/05/14  Yes Sueanne Margarita,  MD    Review of Systems  As above, overall she is doing very well. She is able to do the things that she wants to around her house without significant limitations.  She denies chest pain, palpitations, dyspnea, pnd, orthopnea, n, v, dizziness, syncope, edema, weight gain, or early satiety.  All other systems reviewed and are otherwise negative except as noted above.  Physical Exam  VS:  BP 172/64 mmHg  Pulse 56  Ht 5' 0.5" (1.537 m)  Wt 128 lb 12.8 oz (58.423 kg)  BMI 24.73 kg/m2  SpO2 98% , BMI Body mass index is 24.73  kg/(m^2). GEN: Well nourished, well developed, in no acute distress. HEENT: normal. Neck: Supple, no JVD, carotid bruits, or masses. Cardiac: RRR, no rubs, or gallops. 3/6 systolic ejection murmur loudest at the right upper sternal border and radiating to the neck bilaterally. No clubbing, cyanosis, edema.  Radials/DP/PT 2+ and equal bilaterally.  Respiratory:  Respirations regular and unlabored, clear to auscultation bilaterally. GI: Soft, nontender, nondistended, BS + x 4. MS: no deformity or atrophy. Skin: warm and dry, no rash. Neuro:  Strength and sensation are intact. Psych: Normal affect.  Accessory Clinical Findings  None  Assessment & Plan  1.  Paroxysmal atrial fibrillation: Pt was recently taken off of amiodarone in the setting of abnl PFT's and evidence of ILD on CT of the chest.  She has not had any palpitations or symptomatic afib since then.  She is not a candidate for bb/ccb therapy @ this time in the setting of baseline sinus bradycardia with rates in the 50's.  She is not currently a candidate for an alternate antiarrhythmic given amio's long half life.  She remains on coumadin and will be seen in coumadin clinic today.  2.  Severe Ao Stenosis:  Asymptomatic w/o chest pain, presyncope/syncope, or dyspnea.  Previously seen in TAVR clinic and is not currently interested in pursuing any advanced therapies.  3.  Mod-Sev MR:  Conservative mgmt.  4.  HTN:  BP elevated today but reportedly trends better @ home.  Continue lisinopril and she will continue to follow @ home.  5.  HL:  Cont statin therapy.  6.  Dispo:  F/u w/ Dr. [name] in 3 mos or sooner if necessary.  Murray Hodgkins, NP 11/27/2014, 3:16 PM

## 2014-11-30 DIAGNOSIS — E0520 Thyrotoxicosis with toxic multinodular goiter without thyrotoxic crisis or storm: Secondary | ICD-10-CM | POA: Diagnosis not present

## 2014-12-11 ENCOUNTER — Ambulatory Visit (INDEPENDENT_AMBULATORY_CARE_PROVIDER_SITE_OTHER): Payer: Medicare Other | Admitting: *Deleted

## 2014-12-11 DIAGNOSIS — I4891 Unspecified atrial fibrillation: Secondary | ICD-10-CM

## 2014-12-11 DIAGNOSIS — Z5181 Encounter for therapeutic drug level monitoring: Secondary | ICD-10-CM

## 2014-12-11 LAB — POCT INR: INR: 2.1

## 2014-12-26 ENCOUNTER — Encounter (HOSPITAL_COMMUNITY): Payer: Medicare Other

## 2015-01-02 ENCOUNTER — Encounter: Payer: Self-pay | Admitting: Pulmonary Disease

## 2015-01-02 ENCOUNTER — Ambulatory Visit (INDEPENDENT_AMBULATORY_CARE_PROVIDER_SITE_OTHER): Payer: Medicare Other | Admitting: Pulmonary Disease

## 2015-01-02 VITALS — BP 124/62 | HR 55 | Temp 97.7°F | Ht 60.5 in | Wt 128.8 lb

## 2015-01-02 DIAGNOSIS — J849 Interstitial pulmonary disease, unspecified: Secondary | ICD-10-CM

## 2015-01-02 DIAGNOSIS — R942 Abnormal results of pulmonary function studies: Secondary | ICD-10-CM

## 2015-01-02 NOTE — Patient Instructions (Signed)
Continue to stay active, and try to improve your conditioning.  Would like you to come back in 55mos to see Dr. Lake Bells, and will schedule breathing studies before the visit on the same day.

## 2015-01-02 NOTE — Assessment & Plan Note (Signed)
The patient has mild interstitial lung disease on her high-resolution CT, as well as restriction and a reduction in DLCO on her PFTs. She was on amiodarone, but her sedimentation rate was not overly elevated. Her amiodarone has now been discontinued, and the patient has not seen a significant change. However, she had very few symptoms even while on the amiodarone. At this point, I would like to get her back in 6 months and check on things, and also do PFTs for comparison. The patient had a UIP pattern on HRCT, and therefore her interstitial lung disease could be caused by many different things. If her PFTs in 6 months show worsening, she may need further evaluation for other causes of her interstitial lung disease.

## 2015-01-02 NOTE — Progress Notes (Signed)
   Subjective:    Patient ID: Alexa Mcdonald, female    DOB: 1921-07-02, 81 y.o.   MRN: [id number]  HPI The patient comes in today for follow-up of her known interstitial lung disease with abnormal PFTs. The patient had been on amiodarone, and the medication was discontinued in the event this represented pulmonary toxicity. The patient was not really symptomatic from the very start, but she also leads a fairly sedentary lifestyle. She has not seen any change in how she feels since being off amiodarone. She denies any significant cough, mucus, or chest congestion.   Review of Systems  Constitutional: Negative for fever and unexpected weight change.  HENT: Negative for congestion, dental problem, ear pain, nosebleeds, postnasal drip, rhinorrhea, sinus pressure, sneezing, sore throat and trouble swallowing.   Eyes: Negative for redness and itching.  Respiratory: Negative for cough, chest tightness, shortness of breath and wheezing.   Cardiovascular: Negative for palpitations and leg swelling.  Gastrointestinal: Negative for nausea and vomiting.  Genitourinary: Negative for dysuria.  Musculoskeletal: Negative for joint swelling.  Skin: Negative for rash.  Neurological: Negative for headaches.  Hematological: Does not bruise/bleed easily.  Psychiatric/Behavioral: Negative for dysphoric mood. The patient is not nervous/anxious.        Objective:   Physical Exam Well-developed female in no acute distress Nose without purulence or discharge noted Neck without lymphadenopathy or thyromegaly Chest fairly clear to auscultation, no definite crackles. Cardiac exam with blowing 4/ 6 systolic murmur Lower extremities without edema, no cyanosis Alert and oriented, moves all 4 extremities.        Assessment & Plan:

## 2015-01-03 ENCOUNTER — Ambulatory Visit (INDEPENDENT_AMBULATORY_CARE_PROVIDER_SITE_OTHER): Payer: Medicare Other | Admitting: *Deleted

## 2015-01-03 DIAGNOSIS — I4891 Unspecified atrial fibrillation: Secondary | ICD-10-CM

## 2015-01-03 DIAGNOSIS — Z5181 Encounter for therapeutic drug level monitoring: Secondary | ICD-10-CM

## 2015-01-03 LAB — POCT INR: INR: 1.6

## 2015-01-22 ENCOUNTER — Ambulatory Visit (INDEPENDENT_AMBULATORY_CARE_PROVIDER_SITE_OTHER): Payer: Medicare Other | Admitting: *Deleted

## 2015-01-22 DIAGNOSIS — Z5181 Encounter for therapeutic drug level monitoring: Secondary | ICD-10-CM | POA: Diagnosis not present

## 2015-01-22 DIAGNOSIS — H524 Presbyopia: Secondary | ICD-10-CM | POA: Diagnosis not present

## 2015-01-22 DIAGNOSIS — H52221 Regular astigmatism, right eye: Secondary | ICD-10-CM | POA: Diagnosis not present

## 2015-01-22 DIAGNOSIS — H3531 Nonexudative age-related macular degeneration: Secondary | ICD-10-CM | POA: Diagnosis not present

## 2015-01-22 DIAGNOSIS — H52222 Regular astigmatism, left eye: Secondary | ICD-10-CM | POA: Diagnosis not present

## 2015-01-22 DIAGNOSIS — E119 Type 2 diabetes mellitus without complications: Secondary | ICD-10-CM | POA: Diagnosis not present

## 2015-01-22 DIAGNOSIS — I4891 Unspecified atrial fibrillation: Secondary | ICD-10-CM | POA: Diagnosis not present

## 2015-01-22 DIAGNOSIS — H5212 Myopia, left eye: Secondary | ICD-10-CM | POA: Diagnosis not present

## 2015-01-22 LAB — POCT INR: INR: 2.4

## 2015-01-26 ENCOUNTER — Other Ambulatory Visit: Payer: Self-pay | Admitting: *Deleted

## 2015-01-26 MED ORDER — WARFARIN SODIUM 2.5 MG PO TABS: ORAL_TABLET | ORAL | Status: DC

## 2015-02-12 ENCOUNTER — Ambulatory Visit (INDEPENDENT_AMBULATORY_CARE_PROVIDER_SITE_OTHER): Payer: Medicare Other | Admitting: *Deleted

## 2015-02-12 DIAGNOSIS — Z5181 Encounter for therapeutic drug level monitoring: Secondary | ICD-10-CM | POA: Diagnosis not present

## 2015-02-12 DIAGNOSIS — I4891 Unspecified atrial fibrillation: Secondary | ICD-10-CM | POA: Diagnosis not present

## 2015-02-12 LAB — POCT INR: INR: 1.8

## 2015-02-26 NOTE — Progress Notes (Signed)
Cardiology Office Note   Date:  02/27/2015   ID:  Alexa Mcdonald, DOB 10/27/1920, MRN [id number]  PCP:  [name], MD    Chief Complaint  Patient presents with  . Atrial Fibrillation      History of Present Illness: Alexa Mcdonald is a 89.o. female with a history of severe AS medically managed, HTN, PAF on chronic anticoagulation and chronic LE edema. She is doing well. She denies any chest pain or pressure, SOB, DOE, dizziness, palpitations or syncope. She has chronic LE edema which has been controlled. She is still quite active and is able to do her daily activities and shop without any SOB or chest pain. She remains asymptomatic. She is still active at home doing her daily activities.   Past Medical History  Diagnosis Date  . Mediastinal goiter     a. 1988: s/p resection.  . CKD (chronic kidney disease), stage III   . Osteopenia   . Osteoporosis   . Vitamin D deficiency   . Paroxysmal atrial fibrillation     a. CHA2DS2VASc = 5-->coumadin;  b. previously on amio->d/c'd 11/2014 secondary to concern for possible amio lung.  . Essential hypertension   . LBBB (left bundle branch block)   . Severe aortic stenosis     a. 06/2014 Echo: EF 50-55%, normal wall motion, Gr 2 DD, triv AI, Sev AS (mean grad 21mmHg, valve area - 0.5cm^2 VTI), mod-sev MR, sev dil LA.  [name] Hx of orthostatic hypotension   . Colon, diverticulosis   . Tachycardia-bradycardia   . Diabetes mellitus     diet controlled  . Esophageal reflux   . GERD (gastroesophageal reflux disease)   . Mitral regurgitation     a. 06/2014 Echo: mod-severe MR.  . 1St degree AV block     Past Surgical History  Procedure Laterality Date  . Cataract extraction    . Carpal tunnel release    . Mri  08/18/07    head (diabetes heart study at [facility])  . Mediastinal removal of a goiter    . Cesarean section      x3     Current Outpatient Prescriptions  Medication Sig Dispense Refill  .  acetaminophen (TYLENOL) 500 MG tablet Take 500 mg by mouth every 6 (six) hours as needed for moderate pain.    . Calcium Carb-Cholecalciferol (CALCIUM 600/VITAMIN D3 PO) Take 1 tablet by mouth 2 (two) times daily.    [name] lisinopril (PRINIVIL,ZESTRIL) 20 MG tablet Take 1 tablet (20 mg total) by mouth 2 (two) times daily. 60 tablet 11  . methimazole (TAPAZOLE) 5 MG tablet Take 5 mg by mouth daily.    . Multiple Vitamins-Minerals (OCUVITE PRESERVISION PO) Take by mouth.    . pravastatin (PRAVACHOL) 10 MG tablet Take 10 mg by mouth at bedtime.     [name] warfarin (COUMADIN) 2.5 MG tablet Take as directed by [facility] 45 tablet 3   No current facility-administered medications for this visit.    Allergies:   Amoxicillin and Celebrex    Social History:  The patient  reports that she has never smoked. She has never used smokeless tobacco. She reports that she does not drink alcohol or use illicit drugs.   Family History:  The patient's family history includes Arrhythmia in her brother; CAD in her brother; Cancer - Prostate in her father; Diabetes in her mother and sister; Hypertension in  her brother and sister; Pulmonary embolism in her father; Pulmonary fibrosis in her brother.    ROS:  Please see the history of present illness.   Otherwise, review of systems are positive for none.   All other systems are reviewed and negative.    PHYSICAL EXAM: VS:  BP 178/76 mmHg  Pulse 61  Ht 5' 0.5" (1.537 m)  Wt 130 lb 1.9 oz (59.022 kg)  BMI 24.98 kg/m2 , BMI Body mass index is 24.98 kg/(m^2). GEN: Well nourished, well developed, in no acute distress HEENT: normal Neck: no JVD, carotid bruits, or masses Cardiac: RRR; no rubs, or gallops,no edema.  2/6 late peaking systolic murmur at RUSB to LLSB and carotids Respiratory:  clear to auscultation bilaterally, normal work of breathing GI: soft, nontender, nondistended, + BS MS: no deformity or atrophy Skin: warm and dry, no rash Neuro:  Strength and  sensation are intact Psych: euthymic mood, full affect   EKG:  EKG is not ordered today.    Recent Labs: 08/10/2014: TSH 0.85    Lipid Panel No results found for: CHOL, TRIG, HDL, CHOLHDL, VLDL, LDLCALC, LDLDIRECT    Wt Readings from Last 3 Encounters:  02/27/15 130 lb 1.9 oz (59.022 kg)  01/02/15 128 lb 12.8 oz (58.423 kg)  11/27/14 128 lb 12.8 oz (58.423 kg)    ASSESSMENT AND PLAN:  1. Severe to critical AS - she is completely asymptomatic-  She does not want to consider open heart surgery but we have talked about TAVR and she met with Dr. [name] but is not interested at this time.  2. HTN - BP is elevated this am.  She just took her Lisinopril before she came to see me - continue Lisinopril  - I have asked her to check her BP daily for a week and call with the results 3. Chronic LE edema - controlled on no diuretic 4. PAF- maintaining NSR - Amio stopped due to ILD - continue warfarin  5. Chronic systemic anticoagulation    Current medicines are reviewed at length with the patient today.  The patient does not have concerns regarding medicines.  The following changes have been made:  no change  Labs/ tests ordered today: See above Assessment and Plan No orders of the defined types were placed in this encounter.     Disposition:   FU with me in 6 months  Signed, Sueanne Margarita, MD  02/27/2015 10:17 AM    [facility] [place], [place], [place]  [address] Phone: [phone]; Fax: [phone]

## 2015-02-27 ENCOUNTER — Encounter: Payer: Self-pay | Admitting: Cardiology

## 2015-02-27 ENCOUNTER — Ambulatory Visit (INDEPENDENT_AMBULATORY_CARE_PROVIDER_SITE_OTHER): Payer: Medicare Other

## 2015-02-27 ENCOUNTER — Ambulatory Visit (INDEPENDENT_AMBULATORY_CARE_PROVIDER_SITE_OTHER): Payer: Medicare Other | Admitting: Cardiology

## 2015-02-27 VITALS — BP 178/76 | HR 61 | Ht 60.5 in | Wt 130.1 lb

## 2015-02-27 DIAGNOSIS — I480 Paroxysmal atrial fibrillation: Secondary | ICD-10-CM

## 2015-02-27 DIAGNOSIS — I10 Essential (primary) hypertension: Secondary | ICD-10-CM | POA: Diagnosis not present

## 2015-02-27 DIAGNOSIS — I4891 Unspecified atrial fibrillation: Secondary | ICD-10-CM | POA: Diagnosis not present

## 2015-02-27 DIAGNOSIS — Z5181 Encounter for therapeutic drug level monitoring: Secondary | ICD-10-CM

## 2015-02-27 DIAGNOSIS — I350 Nonrheumatic aortic (valve) stenosis: Secondary | ICD-10-CM

## 2015-02-27 LAB — POCT INR: INR: 1.3

## 2015-02-27 NOTE — Patient Instructions (Signed)
Medication Instructions:  Your physician recommends that you continue on your current medications as directed. Please refer to the Current Medication list given to you today.   Labwork: None  Testing/Procedures: None  Follow-Up: Your physician wants you to follow-up in: 6 months with Dr. [name]. You will receive a reminder letter in the mail two months in advance. If you don't receive a letter, please call our office to schedule the follow-up appointment.   Any Other Special Instructions Will Be Listed Below (If Applicable). Please check your BLOOD PRESSURE DAILY FOR A WEEK and call with results.

## 2015-03-07 ENCOUNTER — Telehealth: Payer: Self-pay | Admitting: Cardiology

## 2015-03-07 NOTE — Telephone Encounter (Signed)
Good BP no change in meds

## 2015-03-07 NOTE — Telephone Encounter (Signed)
Spoke with pt and got HRs as well- 143/68, 53 143/75, 63 117/63, 58 136/64, 62 136/67, 65 132/71, 62 136/65, 63  Pt states that is feeling well and no issues. Denies CP, SOB, lightheadedness and dizziness. Will route to Dr. [name].

## 2015-03-07 NOTE — Telephone Encounter (Signed)
Informed pt Dr. [name] said good BPs and no changes in meds. Pt verbalized understanding and was in agreement with this plan.

## 2015-03-07 NOTE — Telephone Encounter (Signed)
New message    Pt c/o BP issue: STAT if pt c/o blurred vision, one-sided weakness or slurred speech  1. What are your last 5 BP readings?  143/68, 143/75, 117/63, 136/64, 136/67, 129/66, 132/71, 136/65 2. Are you having any other symptoms (ex. Dizziness, headache, blurred vision, passed out)? no 3. What is your BP issue?  Calling to give bp readings for the last week

## 2015-03-13 ENCOUNTER — Ambulatory Visit (INDEPENDENT_AMBULATORY_CARE_PROVIDER_SITE_OTHER): Payer: Medicare Other | Admitting: *Deleted

## 2015-03-13 DIAGNOSIS — Z5181 Encounter for therapeutic drug level monitoring: Secondary | ICD-10-CM

## 2015-03-13 DIAGNOSIS — I4891 Unspecified atrial fibrillation: Secondary | ICD-10-CM | POA: Diagnosis not present

## 2015-03-13 LAB — POCT INR: INR: 1.3

## 2015-03-13 MED ORDER — WARFARIN SODIUM 2.5 MG PO TABS: ORAL_TABLET | ORAL | Status: DC

## 2015-03-20 ENCOUNTER — Ambulatory Visit (INDEPENDENT_AMBULATORY_CARE_PROVIDER_SITE_OTHER): Payer: Medicare Other

## 2015-03-20 DIAGNOSIS — Z5181 Encounter for therapeutic drug level monitoring: Secondary | ICD-10-CM

## 2015-03-20 DIAGNOSIS — I4891 Unspecified atrial fibrillation: Secondary | ICD-10-CM

## 2015-03-20 LAB — POCT INR: INR: 1.6

## 2015-03-23 DIAGNOSIS — E780 Pure hypercholesterolemia: Secondary | ICD-10-CM | POA: Diagnosis not present

## 2015-03-23 DIAGNOSIS — E559 Vitamin D deficiency, unspecified: Secondary | ICD-10-CM | POA: Diagnosis not present

## 2015-03-23 DIAGNOSIS — Z0000 Encounter for general adult medical examination without abnormal findings: Secondary | ICD-10-CM | POA: Diagnosis not present

## 2015-03-23 DIAGNOSIS — I517 Cardiomegaly: Secondary | ICD-10-CM | POA: Diagnosis not present

## 2015-03-23 DIAGNOSIS — E0520 Thyrotoxicosis with toxic multinodular goiter without thyrotoxic crisis or storm: Secondary | ICD-10-CM | POA: Diagnosis not present

## 2015-03-23 DIAGNOSIS — I350 Nonrheumatic aortic (valve) stenosis: Secondary | ICD-10-CM | POA: Diagnosis not present

## 2015-03-23 DIAGNOSIS — N183 Chronic kidney disease, stage 3 (moderate): Secondary | ICD-10-CM | POA: Diagnosis not present

## 2015-03-23 DIAGNOSIS — M810 Age-related osteoporosis without current pathological fracture: Secondary | ICD-10-CM | POA: Diagnosis not present

## 2015-03-23 DIAGNOSIS — I481 Persistent atrial fibrillation: Secondary | ICD-10-CM | POA: Diagnosis not present

## 2015-03-23 DIAGNOSIS — I10 Essential (primary) hypertension: Secondary | ICD-10-CM | POA: Diagnosis not present

## 2015-03-23 DIAGNOSIS — E1122 Type 2 diabetes mellitus with diabetic chronic kidney disease: Secondary | ICD-10-CM | POA: Diagnosis not present

## 2015-03-23 DIAGNOSIS — Z7901 Long term (current) use of anticoagulants: Secondary | ICD-10-CM | POA: Diagnosis not present

## 2015-04-03 ENCOUNTER — Ambulatory Visit (INDEPENDENT_AMBULATORY_CARE_PROVIDER_SITE_OTHER): Payer: Medicare Other | Admitting: *Deleted

## 2015-04-03 DIAGNOSIS — Z5181 Encounter for therapeutic drug level monitoring: Secondary | ICD-10-CM | POA: Diagnosis not present

## 2015-04-03 DIAGNOSIS — I4891 Unspecified atrial fibrillation: Secondary | ICD-10-CM

## 2015-04-03 LAB — POCT INR: INR: 1.8

## 2015-04-17 ENCOUNTER — Ambulatory Visit (INDEPENDENT_AMBULATORY_CARE_PROVIDER_SITE_OTHER): Payer: Medicare Other

## 2015-04-17 DIAGNOSIS — I4891 Unspecified atrial fibrillation: Secondary | ICD-10-CM | POA: Diagnosis not present

## 2015-04-17 DIAGNOSIS — Z5181 Encounter for therapeutic drug level monitoring: Secondary | ICD-10-CM | POA: Diagnosis not present

## 2015-04-17 LAB — POCT INR: INR: 1.6

## 2015-04-23 DIAGNOSIS — Z1211 Encounter for screening for malignant neoplasm of colon: Secondary | ICD-10-CM | POA: Diagnosis not present

## 2015-04-26 DIAGNOSIS — Z23 Encounter for immunization: Secondary | ICD-10-CM | POA: Diagnosis not present

## 2015-05-01 ENCOUNTER — Ambulatory Visit (INDEPENDENT_AMBULATORY_CARE_PROVIDER_SITE_OTHER): Payer: Medicare Other | Admitting: *Deleted

## 2015-05-01 DIAGNOSIS — I4891 Unspecified atrial fibrillation: Secondary | ICD-10-CM

## 2015-05-01 DIAGNOSIS — Z5181 Encounter for therapeutic drug level monitoring: Secondary | ICD-10-CM | POA: Diagnosis not present

## 2015-05-01 LAB — POCT INR: INR: 1.4

## 2015-05-08 ENCOUNTER — Ambulatory Visit (INDEPENDENT_AMBULATORY_CARE_PROVIDER_SITE_OTHER): Payer: Medicare Other | Admitting: *Deleted

## 2015-05-08 DIAGNOSIS — I4891 Unspecified atrial fibrillation: Secondary | ICD-10-CM

## 2015-05-08 DIAGNOSIS — Z5181 Encounter for therapeutic drug level monitoring: Secondary | ICD-10-CM

## 2015-05-08 LAB — POCT INR: INR: 1.7

## 2015-05-18 ENCOUNTER — Ambulatory Visit (INDEPENDENT_AMBULATORY_CARE_PROVIDER_SITE_OTHER): Payer: Medicare Other | Admitting: *Deleted

## 2015-05-18 DIAGNOSIS — I4891 Unspecified atrial fibrillation: Secondary | ICD-10-CM | POA: Diagnosis not present

## 2015-05-18 DIAGNOSIS — Z5181 Encounter for therapeutic drug level monitoring: Secondary | ICD-10-CM

## 2015-05-18 LAB — POCT INR: INR: 2.5

## 2015-06-07 ENCOUNTER — Encounter: Payer: Self-pay | Admitting: Cardiology

## 2015-06-07 DIAGNOSIS — E0520 Thyrotoxicosis with toxic multinodular goiter without thyrotoxic crisis or storm: Secondary | ICD-10-CM | POA: Diagnosis not present

## 2015-06-07 DIAGNOSIS — E0590 Thyrotoxicosis, unspecified without thyrotoxic crisis or storm: Secondary | ICD-10-CM | POA: Diagnosis not present

## 2015-06-07 DIAGNOSIS — Z5181 Encounter for therapeutic drug level monitoring: Secondary | ICD-10-CM | POA: Diagnosis not present

## 2015-06-08 ENCOUNTER — Ambulatory Visit (INDEPENDENT_AMBULATORY_CARE_PROVIDER_SITE_OTHER): Payer: Medicare Other | Admitting: *Deleted

## 2015-06-08 DIAGNOSIS — Z5181 Encounter for therapeutic drug level monitoring: Secondary | ICD-10-CM | POA: Diagnosis not present

## 2015-06-08 DIAGNOSIS — I4891 Unspecified atrial fibrillation: Secondary | ICD-10-CM

## 2015-06-08 LAB — POCT INR: INR: 2.6

## 2015-07-02 ENCOUNTER — Other Ambulatory Visit: Payer: Self-pay

## 2015-07-02 MED ORDER — LISINOPRIL 20 MG PO TABS: 20.0000 mg | ORAL_TABLET | Freq: Two times a day (BID) | ORAL | Status: DC

## 2015-07-06 ENCOUNTER — Ambulatory Visit (INDEPENDENT_AMBULATORY_CARE_PROVIDER_SITE_OTHER): Payer: Medicare Other | Admitting: *Deleted

## 2015-07-06 DIAGNOSIS — I4891 Unspecified atrial fibrillation: Secondary | ICD-10-CM

## 2015-07-06 DIAGNOSIS — Z5181 Encounter for therapeutic drug level monitoring: Secondary | ICD-10-CM | POA: Diagnosis not present

## 2015-07-06 LAB — POCT INR: INR: 2.6

## 2015-08-08 ENCOUNTER — Other Ambulatory Visit: Payer: Self-pay | Admitting: Pulmonary Disease

## 2015-08-08 DIAGNOSIS — R0600 Dyspnea, unspecified: Secondary | ICD-10-CM

## 2015-08-09 ENCOUNTER — Ambulatory Visit (INDEPENDENT_AMBULATORY_CARE_PROVIDER_SITE_OTHER): Payer: Medicare Other | Admitting: Pulmonary Disease

## 2015-08-09 ENCOUNTER — Encounter: Payer: Self-pay | Admitting: Pulmonary Disease

## 2015-08-09 VITALS — BP 136/68 | HR 65 | Ht 60.0 in | Wt 135.0 lb

## 2015-08-09 DIAGNOSIS — J849 Interstitial pulmonary disease, unspecified: Secondary | ICD-10-CM

## 2015-08-09 DIAGNOSIS — R0600 Dyspnea, unspecified: Secondary | ICD-10-CM

## 2015-08-09 LAB — PULMONARY FUNCTION TEST
DL/VA % pred: 98 %
DL/VA: 4.18 ml/min/mmHg/L
DLCO unc % pred: 69 %
DLCO unc: 13.19 ml/min/mmHg
FEF 25-75 Post: 2.68 L/sec
FEF 25-75 Pre: 2.27 L/sec
FEF2575-%Change-Post: 17 %
FEF2575-%Pred-Post: 505 %
FEF2575-%Pred-Pre: 428 %
FEV1-%Change-Post: 2 %
FEV1-%Pred-Post: 135 %
FEV1-%Pred-Pre: 132 %
FEV1-Post: 1.51 L
FEV1-Pre: 1.48 L
FEV1FVC-%Change-Post: 1 %
FEV1FVC-%Pred-Pre: 125 %
FEV6-%Change-Post: 1 %
FEV6-%Pred-Post: 117 %
FEV6-%Pred-Pre: 116 %
FEV6-Post: 1.68 L
FEV6-Pre: 1.67 L
FEV6FVC-%Pred-Post: 108 %
FEV6FVC-%Pred-Pre: 108 %
FVC-%Change-Post: 1 %
FVC-%Pred-Post: 107 %
FVC-%Pred-Pre: 106 %
FVC-Post: 1.68 L
FVC-Pre: 1.67 L
Post FEV1/FVC ratio: 90 %
Post FEV6/FVC ratio: 100 %
Pre FEV1/FVC ratio: 89 %
Pre FEV6/FVC Ratio: 100 %
RV % pred: 76 %
RV: 1.86 L
TLC % pred: 83 %
TLC: 3.7 L

## 2015-08-09 NOTE — Assessment & Plan Note (Signed)
She has a CT chest which is consistent with usual interstitial pneumonitis. My best guess is that this is related to her amiodarone use previously as her disease has not progressed and in fact her lung function testing is a bit better today compared to one year ago. Further, she has no symptoms. If this were idiopathic pulmonary fibrosis than I would expect some decline over the last 12 months. I see no role for empiric treating considering her advanced age and lack of symptoms.  Plan: Repeat pulmonary function testing in one year Follow-up one year or sooner if dyspnea or cough develops in the interim.

## 2015-08-09 NOTE — Patient Instructions (Signed)
We will order a lung function test for one year from now and see you after that If you develop any trouble breathing or an unexplained cough please come back to see me sooner.

## 2015-08-09 NOTE — Progress Notes (Signed)
Subjective:    Patient ID: Alexa Mcdonald, female    DOB: 1920-10-10, 82 y.o.   MRN: [id number]  Synopsis: Alexa Mcdonald was seen by Dr. Gwenette Greet for interstitial lung disease. She established care with Dr. Lake Bells in January 2017.  She used to take amiodarone around 2012 and quit sometime around 2015.  She had no lung problems prior.  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  Never had a connective tissue disease. Dr. Gwenette Greet summarized her clinical situation as follows: PFT's 09/2014:  No obstruction, TLC 1.71 (38%), DLCO 11.92 (63%) HRCT 11/2014:  +ISLD suggestive of UIP  Sed rate 2016:  Low Amiodarone d/ced 11/2014  >> pt saw no difference, but had no symptoms while on amiodarone January 2017 pulmonary function testing ratio 90%, FEV1 1.51 L, FVC 1.68 L, TLC 3.70 L (83% predicted), DLCO 13.19 (69% predicted).  HPI Chief Complaint  Patient presents with  . Follow-up    Former [place] pt being treated for abn PFTs.  review PFT from today.  pt has no breathing complaints today.      Alexa Mcdonald reports no problems.  She has not had any shortness of breath.   No cough.  She has never really had any breathing difficulty.   She has been doing very well lately.   We discussed her history today, see documentation above.  Past Medical History  Diagnosis Date  . Mediastinal goiter     a. 1988: s/p resection.  . CKD (chronic kidney disease), stage III   . Osteopenia   . Osteoporosis   . Vitamin D deficiency   . Paroxysmal atrial fibrillation (HCC)     a. CHA2DS2VASc = 5-->coumadin;  b. previously on amio->d/c'd 11/2014 secondary to concern for possible amio lung.  . Essential hypertension   . LBBB (left bundle branch block)   . Severe aortic stenosis     a. 06/2014 Echo: EF 50-55%, normal wall motion, Gr 2 DD, triv AI, Sev AS (mean grad 73mmHg, valve area - 0.5cm^2 VTI), mod-sev MR, sev dil LA.  [name] Hx of orthostatic hypotension   . Colon, diverticulosis   .  Tachycardia-bradycardia ([place])   . Diabetes mellitus     diet controlled  . Esophageal reflux   . GERD (gastroesophageal reflux disease)   . Mitral regurgitation     a. 06/2014 Echo: mod-severe MR.  . 1St degree AV block       Review of Systems     Objective:   Physical Exam Filed Vitals:   08/09/15 1347  BP: 136/68  Pulse: 65  Height: 5' (1.524 m)  Weight: 135 lb (61.236 kg)  SpO2: 100%   RA  Gen: well appearing HENT: OP clear,  neck supple PULM: Crackles bases B, normal percussion CV: RRR, III/VI systolic murmur left upper sternal border, trace edema GI: BS+, soft, nontender Derm: no cyanosis or rash Psyche: normal mood and affect        Assessment & Plan:  Interstitial lung disease ([place]) She has a CT chest which is consistent with usual interstitial pneumonitis. My best guess is that this is related to her amiodarone use previously as her disease has not progressed and in fact her lung function testing is a bit better today compared to one year ago. Further, she has no symptoms. If this were idiopathic pulmonary fibrosis than I would expect some decline over the last 12 months. I see no role for empiric treating considering her advanced age and  lack of symptoms.  Plan: Repeat pulmonary function testing in one year Follow-up one year or sooner if dyspnea or cough develops in the interim.     Current outpatient prescriptions:  .  acetaminophen (TYLENOL) 500 MG tablet, Take 500 mg by mouth every 6 (six) hours as needed for moderate pain., Disp: , Rfl:  .  Calcium Carb-Cholecalciferol (CALCIUM 600/VITAMIN D3 PO), Take 1 tablet by mouth 2 (two) times daily., Disp: , Rfl:  .  lisinopril (PRINIVIL,ZESTRIL) 20 MG tablet, Take 1 tablet (20 mg total) by mouth 2 (two) times daily., Disp: 60 tablet, Rfl: 11 .  methimazole (TAPAZOLE) 5 MG tablet, Take 5 mg by mouth daily., Disp: , Rfl:  .  Multiple Vitamins-Minerals (OCUVITE PRESERVISION PO), Take by mouth., Disp: , Rfl:    .  pravastatin (PRAVACHOL) 10 MG tablet, Take 10 mg by mouth at bedtime. , Disp: , Rfl:  .  warfarin (COUMADIN) 2.5 MG tablet, Take as directed by [facility], Disp: 45 tablet, Rfl: 3

## 2015-08-09 NOTE — Progress Notes (Signed)
PFT done today. 

## 2015-08-10 ENCOUNTER — Ambulatory Visit (INDEPENDENT_AMBULATORY_CARE_PROVIDER_SITE_OTHER): Payer: Medicare Other | Admitting: Pharmacist

## 2015-08-10 DIAGNOSIS — Z5181 Encounter for therapeutic drug level monitoring: Secondary | ICD-10-CM | POA: Diagnosis not present

## 2015-08-10 DIAGNOSIS — I4891 Unspecified atrial fibrillation: Secondary | ICD-10-CM | POA: Diagnosis not present

## 2015-08-10 LAB — POCT INR: INR: 2.3

## 2015-08-21 DIAGNOSIS — L570 Actinic keratosis: Secondary | ICD-10-CM | POA: Diagnosis not present

## 2015-08-21 DIAGNOSIS — D0461 Carcinoma in situ of skin of right upper limb, including shoulder: Secondary | ICD-10-CM | POA: Diagnosis not present

## 2015-08-21 DIAGNOSIS — L720 Epidermal cyst: Secondary | ICD-10-CM | POA: Diagnosis not present

## 2015-08-21 DIAGNOSIS — L821 Other seborrheic keratosis: Secondary | ICD-10-CM | POA: Diagnosis not present

## 2015-08-21 DIAGNOSIS — Z85828 Personal history of other malignant neoplasm of skin: Secondary | ICD-10-CM | POA: Diagnosis not present

## 2015-08-27 DIAGNOSIS — Z85828 Personal history of other malignant neoplasm of skin: Secondary | ICD-10-CM | POA: Diagnosis not present

## 2015-08-27 DIAGNOSIS — D0461 Carcinoma in situ of skin of right upper limb, including shoulder: Secondary | ICD-10-CM | POA: Diagnosis not present

## 2015-09-21 ENCOUNTER — Ambulatory Visit (INDEPENDENT_AMBULATORY_CARE_PROVIDER_SITE_OTHER): Payer: Medicare Other | Admitting: *Deleted

## 2015-09-21 DIAGNOSIS — Z5181 Encounter for therapeutic drug level monitoring: Secondary | ICD-10-CM

## 2015-09-21 DIAGNOSIS — I4891 Unspecified atrial fibrillation: Secondary | ICD-10-CM | POA: Diagnosis not present

## 2015-09-21 LAB — POCT INR: INR: 2.1

## 2015-09-27 ENCOUNTER — Ambulatory Visit: Payer: Medicare Other | Admitting: Cardiology

## 2015-10-02 NOTE — Progress Notes (Signed)
Cardiology Office Note   Date:  10/03/2015   ID:  Alexa Mcdonald, DOB June 23, 1921, MRN [id number]  PCP:  Alexa Coma, MD    Chief Complaint  Patient presents with  . Aortic Stenosis  . Hypertension  . Atrial Fibrillation      History of Present Illness: Alexa Mcdonald is a 82 y.o. female with a history of severe AS medically managed, HTN, PAF on chronic anticoagulation and chronic LE edema. She is doing well. She denies any chest pain or pressure, SOB, DOE, dizziness, palpitations, LE edema or syncope.  She is still quite active and is able to do her daily activities and shop without any SOB or chest pain. She remains asymptomatic.    Past Medical History  Diagnosis Date  . Mediastinal goiter     a. 1988: s/p resection.  . CKD (chronic kidney disease), stage III   . Osteopenia   . Osteoporosis   . Vitamin D deficiency   . Paroxysmal atrial fibrillation (HCC)     a. CHA2DS2VASc = 5-->coumadin;  b. previously on amio->d/c'd 11/2014 secondary to concern for possible amio lung.  . Essential hypertension   . LBBB (left bundle branch block)   . Severe aortic stenosis     a. 06/2014 Echo: EF 50-55%, normal wall motion, Gr 2 DD, triv AI, Sev AS (mean grad 52mHg, valve area - 0.5cm^2 VTI), mod-sev MR, sev dil LA..[name] Patient was evaluated by Dr. CBurt Knackfor TAVR but is not interested.  .Marland KitchenHx of orthostatic hypotension   . Colon, diverticulosis   . [place]   . Diabetes mellitus     diet controlled  . Esophageal reflux   . GERD (gastroesophageal reflux disease)   . Mitral regurgitation     a. 06/2014 Echo: mod-severe MR.  . 1St degree AV block     Past Surgical History  Procedure Laterality Date  . Cataract extraction    . Carpal tunnel release    . M[facility]  [facility]18[facility]07    [facility]  . Mediastinal removal of a goiter    . Cesarean section      x3     Current Outpatient Prescriptions  Medication Sig  Dispense Refill  . acetaminophen (TYLENOL) 500 MG tablet Take 500 mg by mouth every 6 (six) hours as needed for moderate pain.    . Calcium Carb-Cholecalciferol (CALCIUM 600/VITAMIN D3 PO) Take 1 tablet by mouth 2 (two) times daily.    .Marland Kitchenlisinopril (PRINIVIL,ZESTRIL) 20 MG tablet Take 1 tablet (20 mg total) by mouth 2 (two) times daily. 60 tablet 11  . methimazole (TAPAZOLE) 5 MG tablet Take 5 mg by mouth daily.    . Multiple Vitamins-Minerals (OCUVITE PRESERVISION PO) Take by mouth.    . pravastatin (PRAVACHOL) 10 MG tablet Take 10 mg by mouth at bedtime.     .Marland Kitchenwarfarin (COUMADIN) 2.5 MG tablet Take as directed by [facility] 45 tablet 3   No current facility-administered medications for this visit.    Allergies:   Amoxicillin and Celebrex    Social History:  The patient  reports that she has never smoked. She has never used smokeless tobacco. She reports that she does not drink alcohol or use illicit drugs.   Family History:  The patient's   family history includes Arrhythmia in her brother; CAD in her brother; Cancer - Prostate in  her father; Diabetes in her mother and sister; Hypertension in her brother and sister; Pulmonary embolism in her father; Pulmonary fibrosis in her brother.    ROS:  Please see the history of present illness.   Otherwise, review of systems are positive for none.   All other systems are reviewed and negative.    PHYSICAL EXAM: VS:  BP 170/62 mmHg  Pulse 64  Ht 5' 0.5" (1.537 m)  Wt 134 lb (60.782 kg)  BMI 25.73 kg/m2 , BMI Body mass index is 25.73 kg/(m^2). GEN: Well nourished, well developed, in no acute distress HEENT: normal Neck: no JVD, carotid bruits, or masses Cardiac: RRR; no rubs, or gallops,no edema.  2/6 harsh late peaking systolic murmur at the RUSB radiating to left carotid. Respiratory:  clear to auscultation bilaterally, normal work of breathing GI: soft, nontender, nondistended, + BS MS: no deformity or atrophy Skin: warm and dry, no  rash Neuro:  Strength and sensation are intact Psych: euthymic mood, full affect   EKG:  EKG was ordered today and showed NSR with LBBB   Recent Labs: No results found for requested labs within last 365 days.    Lipid Panel No results found for: CHOL, TRIG, HDL, CHOLHDL, VLDL, LDLCALC, LDLDIRECT    Wt Readings from Last 3 Encounters:  10/03/15 134 lb (60.782 kg)  08/09/15 135 lb (61.236 kg)  02/27/15 130 lb 1.9 oz (59.022 kg)    ASSESSMENT AND PLAN:  1. Severe to critical AS - she is completely asymptomatic-  She does not want to consider open heart surgery but we have talked about TAVR and she met with Dr. Cooper but is not interested at this time. Now she would like to go back to talk with him again about her options.  Continue statin. 2. HTN - BP borderline controlled.  She says at home it runs 136 systolic.   - continue Lisinopril  - I have asked her to check her BP daily for a week and call with the results 3.  Chronic LE edema - controlled on no diuretic 4.  PAF- maintaining NSR - Amio stopped due to ILD - continue warfarin  5. Chronic systemic anticoagulation   Current medicines are reviewed at length with the patient today.  The patient does not have concerns regarding medicines.  The following changes have been made:  no change  Labs/ tests ordered today: See above Assessment and Plan No orders of the defined types were placed in this encounter.     Disposition:   FU with me in 6 months  Signed, , R, MD  10/03/2015 10:33 AM    [facility] [address], [place], [place]  [address] Phone: [phone]; Fax: [phone]    

## 2015-10-03 ENCOUNTER — Ambulatory Visit (INDEPENDENT_AMBULATORY_CARE_PROVIDER_SITE_OTHER): Payer: Medicare Other | Admitting: Cardiology

## 2015-10-03 ENCOUNTER — Encounter: Payer: Self-pay | Admitting: Cardiology

## 2015-10-03 VITALS — BP 170/62 | HR 64 | Ht 60.5 in | Wt 134.0 lb

## 2015-10-03 DIAGNOSIS — I10 Essential (primary) hypertension: Secondary | ICD-10-CM | POA: Diagnosis not present

## 2015-10-03 DIAGNOSIS — I350 Nonrheumatic aortic (valve) stenosis: Secondary | ICD-10-CM | POA: Diagnosis not present

## 2015-10-03 DIAGNOSIS — I480 Paroxysmal atrial fibrillation: Secondary | ICD-10-CM

## 2015-10-03 NOTE — Patient Instructions (Signed)
Medication Instructions:  Your physician recommends that you continue on your current medications as directed. Please refer to the Current Medication list given to you today.   Labwork: None  Testing/Procedures: None  Follow-Up: Your physician recommends that you schedule a follow-up appointment with Dr. [name] to talk again about possible TAVR.  Your physician wants you to follow-up in: 6 months with Dr. [name]. You will receive a reminder letter in the mail two months in advance. If you don't receive a letter, please call our office to schedule the follow-up appointment.   Any Other Special Instructions Will Be Listed Below (If Applicable).     If you need a refill on your cardiac medications before your next appointment, please call your pharmacy.

## 2015-10-11 ENCOUNTER — Telehealth: Payer: Self-pay | Admitting: Cardiology

## 2015-10-11 NOTE — Telephone Encounter (Signed)
To Dr. Turner for review. 

## 2015-10-11 NOTE — Telephone Encounter (Signed)
New Message  Pt reporting BP/HR from last week. Please advise   3/2- 116/70 p 66 3/3- 130/83 p 72 3/4- 122/71 p 74 3/5- 141/70 p 77 3/6- 119/68 p 70 3/7- 128/62 p 69 3/8- 123/62 p 70

## 2015-10-11 NOTE — Telephone Encounter (Signed)
BP and HR controlled - no new changes

## 2015-10-12 NOTE — Telephone Encounter (Signed)
Informed patient BPs and HRs were reviewed by Dr. [name] and no new recommendations are being made. Patient was grateful for call.

## 2015-10-15 ENCOUNTER — Ambulatory Visit (INDEPENDENT_AMBULATORY_CARE_PROVIDER_SITE_OTHER): Payer: Medicare Other | Admitting: Cardiovascular Disease

## 2015-10-15 ENCOUNTER — Encounter: Payer: Self-pay | Admitting: Cardiovascular Disease

## 2015-10-15 VITALS — BP 134/70 | HR 72 | Ht 60.5 in | Wt 132.6 lb

## 2015-10-15 DIAGNOSIS — I350 Nonrheumatic aortic (valve) stenosis: Secondary | ICD-10-CM

## 2015-10-15 NOTE — Patient Instructions (Addendum)
Medication Instructions:  Your physician recommends that you continue on your current medications as directed. Please refer to the Current Medication list given to you today.  Labwork: Your physician recommends that you return for lab work on 10/17/15 (BMP and CBC)  Testing/Procedures: Your physician has requested that you have an echocardiogram (10/17/15). Echocardiography is a painless test that uses sound waves to create images of your heart. It provides your doctor with information about the size and shape of your heart and how well your heart's chambers and valves are working. This procedure takes approximately one hour. There are no restrictions for this procedure.  Your physician has requested that you have a cardiac catheterization (10/31/15). Cardiac catheterization is used to diagnose and/or treat various heart conditions. Doctors may recommend this procedure for a number of different reasons. The most common reason is to evaluate chest pain. Chest pain can be a symptom of coronary artery disease (CAD), and cardiac catheterization can show whether plaque is narrowing or blocking your heart's arteries. This procedure is also used to evaluate the valves, as well as measure the blood flow and oxygen levels in different parts of your heart. For further information please visit [facility]. Please follow instruction sheet, as given.  Non-Cardiac CT Angiography (CTA), is a special type of CT scan that uses a computer to produce multi-dimensional views of major blood vessels throughout the body. In CT angiography, a contrast material is injected through an IV to help visualize the blood vessels (CT Angio Chest/Abdomen and Pelvis)  Your physician has requested that you have GATED cardiac CT. Cardiac computed tomography (CT) is a painless test that uses an x-ray machine to take clear, detailed pictures of your heart. For further information please visit [facility]. Please follow  instruction sheet as given.  Pre CT instructions: No solid foods, caffeine, herbal supplements or smoking 4 hours prior to CT   Follow-Up: We will arrange further TAVR evaluation after completion of tests.   Any Other Special Instructions Will Be Listed Below (If Applicable).     If you need a refill on your cardiac medications before your next appointment, please call your pharmacy.

## 2015-10-15 NOTE — Progress Notes (Signed)
Cardiology Office Note Date:  10/15/2015   ID:  Alexa Mcdonald, DOB 1921-02-03, MRN [id number]  PCP:  Alexa Coma, MD  Cardiologist:  Alexa Mocha, MD    Chief Complaint  Patient presents with  . TAVR consult    Denies any chest pain, lee, sob, or claudication     History of Present Illness: Alexa Mcdonald is a 82 y.o. female who presents for Follow-up of severe aortic stenosis. The patient was initially seen in November 2015 after undergoing evaluation earlier that year by Dr. Roxy Mcdonald. She had been noted to have a long-standing heart murmur and was found to have progressive aortic stenosis by surveillance ultrasound. Echo findings were consistent with critical aortic stenosis with peak and mean transvalvular gradients of 115 and 74 mmHg, respectively. Her calculated aortic valve area is 0.5 cm. At the time she elected to continue with medical therapy because of absence of cardiopulmonary symptoms.  She was recently seen by Dr. Radford Mcdonald. She has begun to develop symptoms of exertional dyspnea and dizziness and the patient is reconsidering TAVR as a treatment option. Over the past month she has developed exertional dyspnea with moderate level activities. She's had to stop and rest when walking especially when any significant distance is involved. She has not had resting shortness of breath, orthopnea, PND, or leg swelling. She also experiences lightheadedness on an episodic basis. She has had no exertional chest pain or pressure. She otherwise has felt well and denies any other specific limitation. She remains functionally independent, lives alone, and still operates her car and gets out for grocery shopping. The patient is here with her granddaughter today.    Past Medical History  Diagnosis Date  . Mediastinal goiter     a. 1988: s/p resection.  . CKD (chronic kidney disease), stage III   . Osteopenia   . Osteoporosis   . Vitamin D deficiency   . Paroxysmal atrial fibrillation  (HCC)     a. CHA2DS2VASc = 5-->coumadin;  b. previously on amio->d/c'd 11/2014 secondary to concern for possible amio lung.  . Essential hypertension   . LBBB (left bundle branch block)   . Severe aortic stenosis     a. 06/2014 Echo: EF 50-55%, normal wall motion, Gr 2 DD, triv AI, Sev AS (mean grad 24mmHg, valve area - 0.5cm^2 VTI), mod-sev MR, sev dil LA.[name]  Patient was evaluated by Dr. [name] for TAVR but is not interested.  [name] Hx of orthostatic hypotension   . Colon, diverticulosis   . Tachycardia-bradycardia ([place])   . Diabetes mellitus     diet controlled  . Esophageal reflux   . GERD (gastroesophageal reflux disease)   . Mitral regurgitation     a. 06/2014 Echo: mod-severe MR.  . 1St degree AV block     Past Surgical History  Procedure Laterality Date  . Cataract extraction    . Carpal tunnel release    . Mri  08/18/07    head (diabetes heart [facility] at [facility])  . Mediastinal removal of a goiter    . Cesarean section      x3    Current Outpatient Prescriptions  Medication Sig Dispense Refill  . acetaminophen (TYLENOL) 500 MG tablet Take 500 mg by mouth every 6 (six) hours as needed for moderate pain.    . Calcium Carb-Cholecalciferol (CALCIUM 600/VITAMIN D3 PO) Take 1 tablet by mouth 2 (two) times daily.    [name] lisinopril (PRINIVIL,ZESTRIL) 20 MG tablet Take 1 tablet (20 mg total) by  mouth 2 (two) times daily. 60 tablet 11  . methimazole (TAPAZOLE) 5 MG tablet Take 5 mg by mouth daily.    . Multiple Vitamins-Minerals (OCUVITE PRESERVISION PO) Take 1 capsule by mouth daily.     . pravastatin (PRAVACHOL) 10 MG tablet Take 10 mg by mouth at bedtime.     [name] warfarin (COUMADIN) 2.5 MG tablet Take as directed by [facility] 45 tablet 3   No current facility-administered medications for this visit.    Allergies:   Amoxicillin and Celebrex   Social History:  The patient  reports that she has never smoked. She has never used smokeless tobacco. She reports that she does not drink  alcohol or use illicit drugs.   Family History:  The patient's  family history includes Arrhythmia in her brother; CAD in her brother; Cancer - Prostate in her father; Diabetes in her mother and sister; Hypertension in her brother and sister; Pulmonary embolism in her father; Pulmonary fibrosis in her brother.    ROS:  Please see the history of present illness.  All other systems are reviewed and negative.    PHYSICAL EXAM: VS:  BP 134/70 mmHg  Pulse 72  Ht 5' 0.5" (1.537 m)  Wt 132 lb 9.6 oz (60.147 kg)  BMI 25.46 kg/m2 , BMI Body mass index is 25.46 kg/(m^2). GEN: Well nourished, well developed, pleasant elderly woman in no acute distress HEENT: normal Neck: no JVD, no masses. Delayed carotid upstrokes bilaterally Cardiac: RRR with grade 3/6 late peaking harsh crescendo decrescendo murmur at the right upper sternal border and grade 3/6 holosystolic murmur at the apex       Respiratory:  clear to auscultation bilaterally, normal work of breathing GI: soft, nontender, nondistended, + BS MS: no deformity or atrophy Ext: no pretibial edema, pedal pulses 2+= bilaterally Skin: warm and dry, no rash Neuro:  Strength and sensation are intact Psych: euthymic mood, full affect  EKG:  EKG is not ordered today.  Recent Labs: No results found for requested labs within last 365 days.   Lipid Panel  No results found for: CHOL, TRIG, HDL, CHOLHDL, VLDL, LDLCALC, LDLDIRECT    Wt Readings from Last 3 Encounters:  10/15/15 132 lb 9.6 oz (60.147 kg)  10/03/15 134 lb (60.782 kg)  08/09/15 135 lb (61.236 kg)     Cardiac Studies Reviewed: 2D Echo 06/19/2014: Study Conclusions  - Left ventricle: The cavity size was normal. Wall thickness was increased in a pattern of mild LVH. Systolic function was low normal to mildly reduced. The estimated ejection fraction was in the range of 50% to 55%. Wall motion was normal; there were no regional wall motion abnormalities. Features are  consistent with a pseudonormal left ventricular filling pattern, with concomitant abnormal relaxation and increased filling pressure (grade 2 diastolic dysfunction). L wave in PW doppler tracing of mitral inflow suggests elevated LV filling pressure. - Aortic valve: Trileaflet; severely calcified leaflets. There was trivial regurgitation. Mean gradient (S): 71 mm Hg. Valve area (VTI): 0.5 cm^2. - Mitral valve: Severely calcified annulus. Mildly calcified leaflets . There was moderate to severe regurgitation. - Left atrium: The atrium was severely dilated. - Right ventricle: The cavity size was normal. Systolic function was normal. - Tricuspid valve: Peak RV-RA gradient (S): 43 mm Hg. - Systemic veins: The IVC was poorly visualized.  Impressions:  - Normal LV size with mild LV hypertrophy. EF 50-55% (low normal to mildly reduced). Moderate diastolic dysfunction with an L wave in the mitral inflow  PW doppler tracing. Severe aortic stenosis. There was moderate to severe MR (potentially could improve with correction of AS). Mild pulmonary hypertension. Normal RV size and systolic function.  ASSESSMENT AND PLAN: 82 yo woman with Stage D, severe symptomatic aortic stenosis. She notes progressive symptoms of shortness of breath and lightheadedness.  At the time of her most recent echocardiogram in 2015 she was noted to have critical aortic stenosis with a mean transvalvular gradient of 71 mmHg. She was also noted to have moderate to severe mitral regurgitation. She had deferred treatment because she had no cardiac symptoms at that time. However, she now clearly has symptomatic aortic stenosis. I have reviewed the natural history of aortic stenosis with the patient and her granddaughter who is also present today. We have discussed the limitations of medical therapy and the poor prognosis associated with symptomatic aortic stenosis. We have reviewed potential treatment  options, including palliative medical therapy, conventional surgical aortic valve replacement, and transcatheter aortic valve replacement. We discussed treatment options in the context of this patient's specific comorbid medical conditions. The patient has been seen by Dr Alexa Mcdonald and she is not a candidate for conventional aortic valve replacement with her advanced age of 84 years and other comorbid medical conditions. I think TAVR is a reasonable treatment option for her considering her independence and fairly good functional capacity.   As it has been over one year since her most recent echo, I have recommended an updated echo study followed by right and left heart catheterization, CT angiogram of the chest, abdomen, and pelvis, and gated CT angiogram of the heart. After those studies are completed she will be referred back to Dr. Roxy Mcdonald as part of her multidisciplinary evaluation.  I have reviewed the risks, indications, and alternatives to cardiac catheterization, possible angioplasty, and stenting with the patient. Risks include but are not limited to bleeding, infection, vascular injury, stroke, myocardial infection, arrhythmia, kidney injury, radiation-related injury in the case of prolonged fluoroscopy use, emergency cardiac surgery, and death. The patient understands the risks of serious complication is 1-2 in [id number] with diagnostic cardiac cath and 1-2% or less with angioplasty/stenting. She will be instructed to hold warfarin 5 days prior to the procedure.   I have reviewed notes from Dr. Lake Bells related to the patient's interstitial lung disease. She is felt to have chest CT findings consistent with usual interstitial pneumonitis. I have reviewed her PFTs which show an FEV1 of 1.48 and diffusion capacity of 69%. These values are stable/improved from her previous. She is felt to be stable with respect to her lung disease and one year follow-up is planned. I don't think this would be prohibitive with  respect to general anesthesia or TAVR.  Current medicines are reviewed with the patient today.  The patient does not have concerns regarding medicines.  Labs/ tests ordered today include:  No orders of the defined types were placed in this encounter.    Disposition:  Follow-up as outlined above  Signed, Alexa Mocha, MD  10/15/2015 11:56 AM    [facility] [address], [place], [place]  [address] Phone: [phone]; Fax: [phone]

## 2015-10-17 ENCOUNTER — Other Ambulatory Visit (INDEPENDENT_AMBULATORY_CARE_PROVIDER_SITE_OTHER): Payer: Medicare Other | Admitting: *Deleted

## 2015-10-17 ENCOUNTER — Ambulatory Visit (HOSPITAL_COMMUNITY): Payer: Medicare Other | Attending: Cardiology

## 2015-10-17 ENCOUNTER — Other Ambulatory Visit: Payer: Self-pay

## 2015-10-17 DIAGNOSIS — I350 Nonrheumatic aortic (valve) stenosis: Secondary | ICD-10-CM

## 2015-10-17 DIAGNOSIS — I272 Other secondary pulmonary hypertension: Secondary | ICD-10-CM | POA: Diagnosis not present

## 2015-10-17 DIAGNOSIS — I359 Nonrheumatic aortic valve disorder, unspecified: Secondary | ICD-10-CM | POA: Diagnosis not present

## 2015-10-17 DIAGNOSIS — I352 Nonrheumatic aortic (valve) stenosis with insufficiency: Secondary | ICD-10-CM | POA: Insufficient documentation

## 2015-10-17 DIAGNOSIS — E119 Type 2 diabetes mellitus without complications: Secondary | ICD-10-CM | POA: Diagnosis not present

## 2015-10-17 DIAGNOSIS — I517 Cardiomegaly: Secondary | ICD-10-CM | POA: Insufficient documentation

## 2015-10-17 DIAGNOSIS — I340 Nonrheumatic mitral (valve) insufficiency: Secondary | ICD-10-CM | POA: Insufficient documentation

## 2015-10-18 LAB — BASIC METABOLIC PANEL
BUN: 24 mg/dL (ref 7–25)
CO2: 26 mmol/L (ref 20–31)
Calcium: 8.9 mg/dL (ref 8.6–10.4)
Chloride: 105 mmol/L (ref 98–110)
Creat: 1.1 mg/dL — ABNORMAL HIGH (ref 0.60–0.88)
Glucose, Bld: 150 mg/dL — ABNORMAL HIGH (ref 65–99)
Potassium: 4.1 mmol/L (ref 3.5–5.3)
Sodium: 139 mmol/L (ref 135–146)

## 2015-10-18 LAB — CBC
HCT: 37.3 % (ref 36.0–46.0)
Hemoglobin: 12.2 g/dL (ref 12.0–15.0)
MCH: 29.6 pg (ref 26.0–34.0)
MCHC: 32.7 g/dL (ref 30.0–36.0)
MCV: 90.5 fL (ref 78.0–100.0)
MPV: 9.7 fL (ref 8.6–12.4)
Platelets: 289 10*3/uL (ref 150–400)
RBC: 4.12 MIL/uL (ref 3.87–5.11)
RDW: 13.9 % (ref 11.5–15.5)
WBC: 8 10*3/uL (ref 4.0–10.5)

## 2015-10-19 ENCOUNTER — Telehealth: Payer: Self-pay | Admitting: Cardiovascular Disease

## 2015-10-19 NOTE — Telephone Encounter (Signed)
New message      Pt states she was not aware that Dr [name] wanted to do a heart cath.  Talk to a nurse

## 2015-10-19 NOTE — Telephone Encounter (Signed)
I spoke with the pt and made her aware of Echo and lab results.  I reviewed all TAVR information again with the pt and she requested that I contact her son to discuss her tests.  I contacted [name] and spoke with him for 15 minutes about TAVR and answered all questions.

## 2015-10-24 ENCOUNTER — Ambulatory Visit (HOSPITAL_COMMUNITY)
Admission: RE | Admit: 2015-10-24 | Discharge: 2015-10-24 | Disposition: A | Discharge status: Home / Self Care | Payer: Medicare Other | Source: Ambulatory Visit | Attending: Cardiovascular Disease | Admitting: Cardiovascular Disease

## 2015-10-24 ENCOUNTER — Ambulatory Visit (HOSPITAL_COMMUNITY): Payer: Medicare Other

## 2015-10-24 ENCOUNTER — Encounter (HOSPITAL_COMMUNITY): Payer: Self-pay

## 2015-10-24 DIAGNOSIS — R590 Localized enlarged lymph nodes: Secondary | ICD-10-CM | POA: Diagnosis not present

## 2015-10-24 DIAGNOSIS — I350 Nonrheumatic aortic (valve) stenosis: Secondary | ICD-10-CM | POA: Diagnosis not present

## 2015-10-24 DIAGNOSIS — Z952 Presence of prosthetic heart valve: Secondary | ICD-10-CM | POA: Diagnosis not present

## 2015-10-24 DIAGNOSIS — R918 Other nonspecific abnormal finding of lung field: Secondary | ICD-10-CM | POA: Diagnosis not present

## 2015-10-24 DIAGNOSIS — E042 Nontoxic multinodular goiter: Secondary | ICD-10-CM | POA: Diagnosis not present

## 2015-10-24 DIAGNOSIS — K5730 Diverticulosis of large intestine without perforation or abscess without bleeding: Secondary | ICD-10-CM | POA: Insufficient documentation

## 2015-10-24 IMAGING — CT CT ANGIO CHEST
1 of 9 series · 1 of 36 positions shown · IV contrast (Iodine)
Comparison: Chest CT [DATE]. CT neck, chest, abdomen and pelvis
[DATE].

CLINICAL DATA: [AGE] female with history of severe aortic
stenosis. Preprocedural study prior to potential transcatheter
aortic valve replacement (TAVR) procedure.

EXAM:
CT ANGIOGRAPHY CHEST, ABDOMEN AND PELVIS
TECHNIQUE: Multidetector CT imaging through the chest, abdomen and pelvis was
performed using the standard protocol during bolus administration of
intravenous contrast. Multiplanar reconstructed images and MIPs were
obtained and reviewed to evaluate the vascular anatomy.
CONTRAST:  70mL OMNIPAQUE IOHEXOL 350 MG/ML SOLN

[Series 200: locator · axial · 0.59mm/px · 1 of 1 slices shown]
[im 1/1  lung]
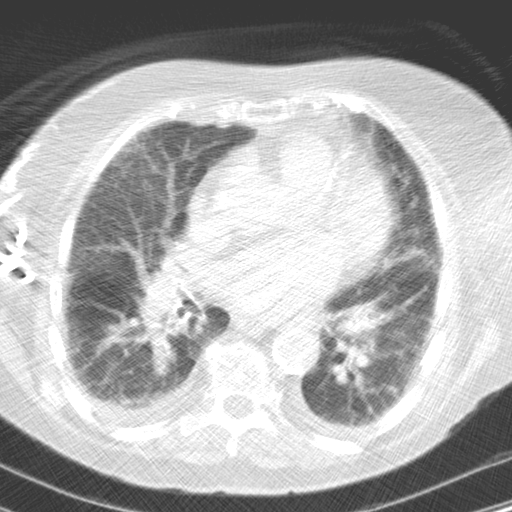

[1 of 36 positions shown; findings below may reference images not displayed]

FINDINGS: CTA CHEST FINDINGS

Mediastinum/Lymph Nodes: Heart size is mildly enlarged. There is a
small filling defect in the tip of the left atrial appendage
concerning for potential thrombus (although this may simply reflect
pseudo thrombus secondary to incomplete mixing of contrast
material). There is no significant pericardial fluid, thickening or
pericardial calcification. There is atherosclerosis of the thoracic
aorta, the great vessels of the mediastinum and the coronary
arteries, including calcified atherosclerotic plaque in the left
main, left anterior descending, left circumflex and right coronary
arteries. Severe thickening calcification of the aortic valve.
Severe mitral annular calcifications. The thyroid gland is again
markedly enlarged and heterogeneous in appearance with the largest
mass-like component of the thyroid gland in the left lobe measuring
up to 5.3 x 6.4 cm, very similar to prior study from [DATE],
most compatible with a multinodular goiter. There is apparent
high-grade stenosis of the right axillary and subclavian veins, with
numerous right-sided chest wall collateral veins. There are multiple
borderline enlarged and mildly enlarged mediastinal and bilateral
hilar lymph nodes, largest of which measures up to 1.2 cm in the low
right paratracheal nodal station. The esophagus is unremarkable in
appearance. No axillary lymphadenopathy.

Lungs/Pleura: Compared to the prior examination there is a new 12 x
8 mm (mean diameter of 10 mm) ground-glass attenuation nodule in the
superior segment of the left lower lobe (image 18 of series 407). No
other larger more suspicious appearing pulmonary nodules or masses
are noted. There are areas of subpleural reticulation, parenchymal
banding in mild traction bronchiectasis scattered throughout the
lungs bilaterally, with a mild craniocaudal gradient. These findings
are very similar to prior study [DATE]. No acute consolidative
airspace disease. No pleural effusions.

Musculoskeletal/Soft Tissues: There are no aggressive appearing
lytic or blastic lesions noted in the visualized portions of the
skeleton.

CTA ABDOMEN AND PELVIS FINDINGS

Hepatobiliary: No suspicious appearing cystic or solid hepatic
lesions. No intra or extrahepatic biliary ductal dilatation.
Gallbladder is unremarkable in appearance.

Pancreas: No pancreatic mass. No pancreatic ductal dilatation. No
pancreatic or peripancreatic fluid or inflammatory changes.

Spleen: Unremarkable.

Adrenals/Urinary Tract: Mild bilateral renal atrophy. Bilateral
kidneys and bilateral adrenal glands are otherwise normal in
appearance. No hydroureteronephrosis. Urinary bladder is normal in
appearance.

Stomach/Bowel: Normal appearance of the stomach. No pathologic
dilatation of small bowel or colon. There are numerous colonic
diverticulaei, most pronounced in the descending colon and sigmoid
colon, without surrounding inflammatory changes to suggest an acute
diverticulitis at this time.

Vascular/Lymphatic: Vascular findings and measurements pertinent to
potential TAVR procedure, as detailed below. No aneurysm or
dissection identified in the abdominal or pelvic vasculature. Single
right renal artery. Two left renal arteries. Celiac axis, superior
mesenteric artery and inferior mesenteric artery are all patent.

Reproductive: Uterus and ovaries are atrophic.

Other: Small bilateral inguinal hernias (left greater than right)
containing only fat. No significant volume of ascites. No
pneumoperitoneum.

Musculoskeletal: There are no aggressive appearing lytic or blastic
lesions noted in the visualized portions of the skeleton.

VASCULAR MEASUREMENTS PERTINENT TO TAVR:

AORTA:

Minimal Aortic Diameter -  12 x 14 mm

Severity of Aortic Calcification -  severe

RIGHT PELVIS:

Right Common Iliac Artery -

Minimal Diameter - 9.5 x 9.4 mm

Tortuosity - mild

Calcification - moderate

Right External Iliac Artery -

Minimal Diameter - 7.2 x 6.0 mm

Tortuosity - moderate

Calcification - none

Right Common Femoral Artery -

Minimal Diameter - 6.8 x 7.4 mm

Tortuosity - mild

Calcification - moderate to severe

LEFT PELVIS:

Left Common Iliac Artery -

Minimal Diameter - 4.9 x 11.3 mm

Tortuosity - moderate

Calcification - moderate

Left External Iliac Artery -

Minimal Diameter - 8.0 x 7.9 mm

Tortuosity - severe

Calcification - minimal

Left Common Femoral Artery -

Minimal Diameter - 3.7 x 5.4 mm

Tortuosity - mild

Calcification - moderate to severe

Review of the MIP images confirms the above findings.
IMPRESSION: 1. Findings and measurements pertinent to potential TAVR procedure,
as detailed above. This patient does appear to have suitable
right-sided pelvic arterial access.
2. Thickening calcification of the aortic valve, compatible with the
reported clinical history of severe aortic stenosis.
3. Small filling defect in the tip of the left atrial appendage.
This is favored to represent pseudothrombus, but close attention at
time of future transesophageal echocardiogram is recommended to
exclude the possibility of true left atrial appendage thrombus.
4. New ground-glass attenuation nodule with a mean diameter of 10 mm
in the superior segment of the left lower lobe. Initial follow-up
with CT at 6-12 months is recommended to confirm persistence. If
persistent, repeat CT is recommended every 2 years until 5 years of
stability has been established. This recommendation follows the
consensus statement: Guidelines for Management of Incidental
Pulmonary Nodules Detected on CT Images:From the [HOSPITAL]
[WK]; published online before print (10.1148/radiol.[PHONE_NUMBER]).
5. Severe colonic diverticulosis without evidence to suggest an
acute diverticulitis at this time.
6. The appearance of the lungs remains compatible with interstitial
lung disease. However, given the stability compared to the prior
study [DATE], this may simply represent mild fibrotic phase
nonspecific interstitial pneumonia (NSIP), as usual interstitial
pneumonia (UIP) is typically progressive.
7. Multiple borderline enlarged and minimally enlarged mediastinal
and bilateral hilar lymph nodes, likely chronic related to
underlying interstitial lung disease.
8. Multinodular goiter redemonstrated.
9. Additional incidental findings, as above.

## 2015-10-24 IMAGING — CT CT HEART MORP W/ CTA COR W/ SCORE W/ CA W/CM &/OR W/O CM
1 of 14 series · 1 of 19 positions shown, 2 images · IV contrast (Iodine)
Comparison: Chest CT [DATE].

CLINICAL DATA: Severe aortic stenosis

EXAM:
Cardiac TAVR CT
TECHNIQUE: The patient was scanned on a Philips 256 scanner. A 120 kV
retrospective scan was triggered in the descending thoracic aorta at
111 HU's. Gantry rotation speed was 270 msecs and collimation was .9
mm. 5 mg of iv metoprolol and no nitro were given. The 3D data set
was reconstructed in 5% intervals of the R-R cycle. Systolic and
diastolic phases were analyzed on a dedicated work station using
MPR, MIP and VRT modes. The patient received 80 cc of contrast.

[Series 200: locator · axial · 0.35mm/px · z∈[-126,-126]mm · 1 of 1 slices shown, 2 images]
[im 1/1  vessel]
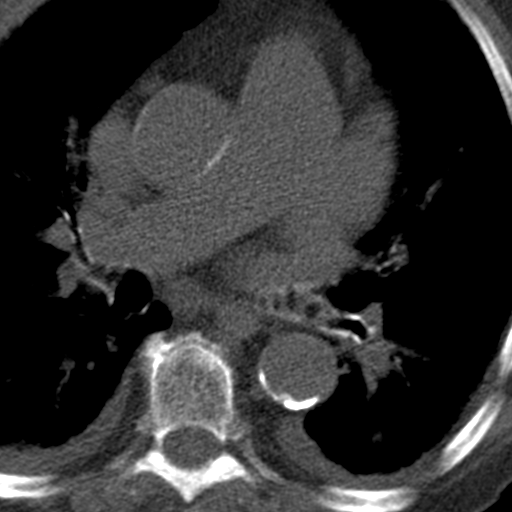
[im 1/1  lung]
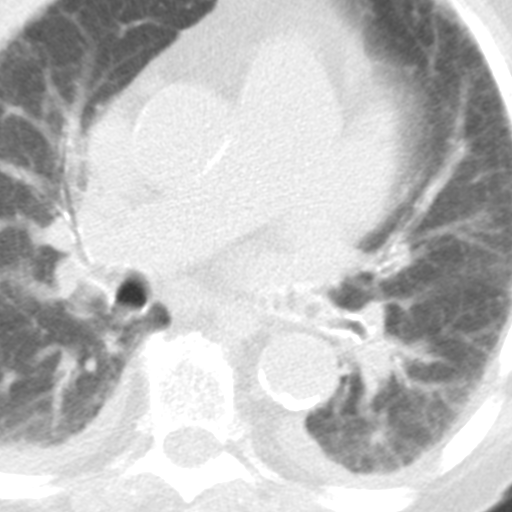

[1 of 19 positions shown; findings below may reference images not displayed]

FINDINGS: Aortic Valve: Trileaflet, severely thickened and calcified with
severely restricted leaflet opening. Only mild calcifications
present in the subvalvular apparatus in the LVOT.

Aorta: Normal size, no dissection. Moderate diffuse calcifications
in the aortic arch and descending thoracic aorta.

Sinotubular Junction:  26 x 25 mm

Ascending Thoracic Aorta:  33 x 31 mm

Aortic Arch:  27 x 26 mm

Descending Thoracic Aorta:  28 x 25 mm

Sinus of Valsalva Measurements:

Non-coronary:  30 mm

Right -coronary:  28 mm

Left -coronary:  30 mm

Coronary Artery Height above Annulus:

Left Main:  9.6 mm

Right Coronary:  17 mm

Virtual Basal Annulus Measurements:

Maximum/Minimum Diameter:  27 x 20 mm

Perimeter:  83 mm

Area:  408 mm2

Optimum Fluoroscopic Angle for Delivery:  LAO 1 ZALEWSKI 1

Pulmonary artery measures 39 x 36 mm suggestive of pulmonary
hypertension.
IMPRESSION: 1. Trileaflet, severely thickened and calcified with severely
restricted leaflet opening with annular measurements suitable for
delivery of 23 mm ZALEWSKI 3 valve.

2. Sufficient coronary to annulus distance.

3. Optimum Fluoroscopic Angle for Delivery:  LAO 1 ZALEWSKI 1.

4. Dilated pulmonary artery suggestive of pulmonary hypertension.

ZALEWSKI

EXAM:
OVER-READ INTERPRETATION  CT CHEST

The following report is an over-read performed by radiologist Dr.
over-read does not include interpretation of cardiac or coronary
anatomy or pathology. The coronary calcium score/coronary CTA
interpretation by the cardiologist is attached.
FINDINGS: Extracardiac findings will be fully described on report for
contemporaneously obtained CTA of the chest, abdomen and pelvis.
IMPRESSION: 1. Please see separate report for dictation of contemporaneously
obtained CTA of chest, abdomen and pelvis [DATE] for full
description of extracardiac findings.

## 2015-10-24 MED ORDER — METOPROLOL TARTRATE 1 MG/ML IV SOLN
INTRAVENOUS | Status: AC
  Filled 2015-10-24: qty 5

## 2015-10-24 MED ORDER — IOHEXOL 350 MG/ML SOLN
80.0000 mL | Freq: Once | INTRAVENOUS | Status: AC | PRN
  Administered 2015-10-24: 80 mL via INTRAVENOUS

## 2015-10-24 MED ORDER — IOHEXOL 350 MG/ML SOLN
70.0000 mL | Freq: Once | INTRAVENOUS | Status: AC | PRN
  Administered 2015-10-24: 70 mL via INTRAVENOUS

## 2015-10-24 MED ORDER — METOPROLOL TARTRATE 1 MG/ML IV SOLN
5.0000 mg | Freq: Once | INTRAVENOUS | Status: AC
  Administered 2015-10-24: 5 mg via INTRAVENOUS

## 2015-10-24 NOTE — Progress Notes (Signed)
CT scan completed. Tolerated well. D/C home in wheelchair with granddaughter. Awake and alert. In no distress.

## 2015-10-30 ENCOUNTER — Encounter: Payer: Self-pay | Admitting: Physical Therapy

## 2015-10-30 ENCOUNTER — Ambulatory Visit: Payer: Medicare Other | Attending: Cardiovascular Disease | Admitting: Physical Therapy

## 2015-10-30 ENCOUNTER — Encounter: Payer: Medicare Other | Admitting: Thoracic Surgery (Cardiothoracic Vascular Surgery)

## 2015-10-30 ENCOUNTER — Other Ambulatory Visit: Payer: Self-pay | Admitting: Cardiovascular Disease

## 2015-10-30 DIAGNOSIS — R2681 Unsteadiness on feet: Secondary | ICD-10-CM | POA: Diagnosis not present

## 2015-10-30 DIAGNOSIS — R2689 Other abnormalities of gait and mobility: Secondary | ICD-10-CM | POA: Insufficient documentation

## 2015-10-30 DIAGNOSIS — M6281 Muscle weakness (generalized): Secondary | ICD-10-CM | POA: Insufficient documentation

## 2015-10-30 DIAGNOSIS — I350 Nonrheumatic aortic (valve) stenosis: Secondary | ICD-10-CM

## 2015-10-30 DIAGNOSIS — H52221 Regular astigmatism, right eye: Secondary | ICD-10-CM | POA: Diagnosis not present

## 2015-10-30 DIAGNOSIS — R293 Abnormal posture: Secondary | ICD-10-CM | POA: Diagnosis not present

## 2015-10-30 DIAGNOSIS — H353131 Nonexudative age-related macular degeneration, bilateral, early dry stage: Secondary | ICD-10-CM | POA: Diagnosis not present

## 2015-10-30 DIAGNOSIS — E119 Type 2 diabetes mellitus without complications: Secondary | ICD-10-CM | POA: Diagnosis not present

## 2015-10-30 DIAGNOSIS — H5212 Myopia, left eye: Secondary | ICD-10-CM | POA: Diagnosis not present

## 2015-10-30 DIAGNOSIS — H524 Presbyopia: Secondary | ICD-10-CM | POA: Diagnosis not present

## 2015-10-30 DIAGNOSIS — H52222 Regular astigmatism, left eye: Secondary | ICD-10-CM | POA: Diagnosis not present

## 2015-10-30 NOTE — Therapy (Signed)
[place], [place], [address] Phone: [phone]   Fax:  [phone]  Physical Therapy Evaluation  Patient Details  Name: Alexa Mcdonald MRN: [id number] Date of Birth: 03/25/1921 Referring Provider: Dr. [name]  Encounter Date: 10/30/2015      PT End of Session - 10/30/15 1616    Visit Number 1   PT Start Time 1302   PT Stop Time 1351   PT Time Calculation (min) 49 min   Activity Tolerance Patient tolerated treatment well      Past Medical History  Diagnosis Date  . Mediastinal goiter     a. 1988: s/p resection.  . CKD (chronic kidney disease), stage III   . Osteopenia   . Osteoporosis   . Vitamin D deficiency   . Paroxysmal atrial fibrillation (HCC)     a. CHA2DS2VASc = 5-->coumadin;  b. previously on amio->d/c'd 11/2014 secondary to concern for possible amio lung.  . Essential hypertension   . LBBB (left bundle branch block)   . Severe aortic stenosis     a. 06/2014 Echo: EF 50-55%, normal wall motion, Gr 2 DD, triv AI, Sev AS (mean grad 42mmHg, valve area - 0.5cm^2 VTI), mod-sev MR, sev dil LA.[name]  Patient was evaluated by Dr. [name] for TAVR but is not interested.  [name] Hx of orthostatic hypotension   . Colon, diverticulosis   . Tachycardia-bradycardia ([place])   . Diabetes mellitus     diet controlled  . Esophageal reflux   . GERD (gastroesophageal reflux disease)   . Mitral regurgitation     a. 06/2014 Echo: mod-severe MR.  . 1St degree AV block     Past Surgical History  Procedure Laterality Date  . Cataract extraction    . Carpal tunnel release    . Mri  08/18/07    head (diabetes heart [facility] at [facility])  . Mediastinal removal of a goiter    . Cesarean section      x3    There were no vitals filed for this visit.  Visit Diagnosis:  Other abnormalities of gait and mobility - Plan: PT plan of care cert/re-cert  Abnormal posture - Plan: PT plan of care  cert/re-cert  Unsteadiness on feet - Plan: PT plan of care cert/re-cert  Muscle weakness (generalized)      Subjective Assessment - 10/30/15 1506    Subjective noticed onset of SOB with household activities such as sweeping, laundry, etc. and with longer distance commuity walking in last 4-6 months, denies chest pain or other symptoms   Patient is accompained by: Family member  grandaughter - Katey   Patient Stated Goals considering heart surgery   Currently in Pain? No/denies            [facility] PT Assessment - 10/30/15 0001    Assessment   Medical Diagnosis severe aortic stenosis   Referring Provider Dr. [name]   Onset Date/Surgical Date 09/18/15  approximate   Precautions   Precautions None   Restrictions   Weight Bearing Restrictions No   Balance Screen   Has the patient fallen in the past 6 months No   Has the patient had a decrease in activity level because of a fear of falling?  No   Is the patient reluctant to leave their home because of a fear of falling?  No   Home Environment   Living Environment Private residence   [place] to enter  Entrance Stairs-Number of Steps 4-8   Entrance Stairs-Rails Right;Left;Cannot reach both   Home Layout One level   Prior Function   Level of Independence Independent  just requiring more rests and assistance with groceries   Posture/Postural Control   Posture/Postural Control Postural limitations   Postural Limitations Forward head;Rounded Shoulders   ROM / Strength   AROM / PROM / Strength AROM;Strength   AROM   Overall AROM Comments grossly WNL   Strength   Overall Strength Comments grossly 4-5/5   Strength Assessment Site Hand   Right/Left hand Right;Left   Right Hand Grip (lbs) 38  R hand dominant   Left Hand Grip (lbs) 35   Ambulation/Gait   Gait Pattern Trendelenburg;Wide base of support  bil hip ER, mild sway   Gait Comments Pt demo's mild unsteadiness with walking  however no LOB observed nor fallrs reported.           [facility] Pre-Surgical Assessment - 10/30/15 0001    5 Meter Walk Test- trial 1 6.9 sec   5 Meter Walk Test- trial 2 6 sec.    5 Meter Walk Test- trial 3 6.6 sec.  >6 sec considered slow speed   5 meter walk test average 6.5 sec   Timed Up & Go Test trial  14.2 sec.   Comments >12 sec indicates incr. fall risk   4 Stage Balance Test tolerated for:  10 sec.   4 Stage Balance Test Position 2   comment inability to hold x 10 seconds indicates increased fall risk   Sit To Stand Test- trial 1 16.4 sec.   ADL/IADL Independent with: Bathing;Dressing;Meal prep;Finances   ADL/IADL Needs Assistance with: [place] work   ADL/IADL Fraility Index Vulnerable   6 Minute Walk- Baseline yes   BP (mmHg) 158/62 mmHg   HR (bpm) 64   02 Sat (%RA) 96 %   Modified Borg Scale for Dyspnea 0- Nothing at all   Perceived Rate of Exertion (Borg) 6-   6 Minute Walk Post Test yes   BP (mmHg) 182/70 mmHg   HR (bpm) 81   02 Sat (%RA) 96 %   Modified Borg Scale for Dyspnea 3- Moderate shortness of breath or breathing difficulty   Perceived Rate of Exertion (Borg) 13- Somewhat hard   Aerobic Endurance Distance Walked 780   Endurance additional comments No rest breaks required. 6 minute walk was taxing and BP incr to 182/70. Pt recovered to 162/64 after 5 minutes seated rest.                         PT Education - 10/30/15 1615    Education provided Yes   Education Details fall risk   Person(s) Educated Other (comment)  granddaughter   Methods Explanation   Comprehension Verbalized understanding                    Plan - 10/30/15 1616    Clinical Impression Statement Pt is a 82 yo female presenting to OP PT for evaluation prior to possible TAVR surgery due to severe aortic stenosis. Pt's granddaughter is present throughout eval. Pt reports onset of exertional dyspnea about 4-6 weeks ago and denies any other symptoms. Dyspnea  typically occurs with household chores or walking longer distances. Pt is now requesting assistance with groceries. Pt presents with good ROM, mild weakness (4/5 throughout), fair balance with increased fall risk per 4 stage balance test and timed up and go.  pt's gait speed is decreased as well and she does some some unsteadiness with walking but no LOB noted. Pt appears to be very careful and takes her time with walking. Pt ambulated 780' in 6 minute walk test with a significant rise in BP noted. BP recovered within 5 minutes. Pt also reported moderate SOB following 6 minute walk.    PT Frequency One time visit   Consulted and Agree with Plan of Care Patient          G-Codes - 11-18-15 1621    Functional Assessment Tool Used 6 minute walk 780'   Functional Limitation Mobility: Walking and moving around   Mobility: Walking and Moving Around Current Status [phone]) At least 20 percent but less than 40 percent impaired, limited or restricted   Mobility: Walking and Moving Around Goal Status [phone]) At least 20 percent but less than 40 percent impaired, limited or restricted   Mobility: Walking and Moving Around Discharge Status [phone]) At least 20 percent but less than 40 percent impaired, limited or restricted       Problem List Patient Active Problem List   Diagnosis Date Noted  . Paroxysmal atrial fibrillation (HCC)   . Mitral regurgitation   . Interstitial lung disease ([place]) 11-18-2014  . Hematochezia 12/13/2013  . CKD (chronic kidney disease), stage III 12/13/2013  . Hyperthyroidism 12/13/2013  . GERD (gastroesophageal reflux disease) 12/13/2013  . Long term current use of anticoagulant therapy 12/13/2013  . Elevated brain natriuretic peptide (BNP) level 12/13/2013  . Sinus bradycardia 12/13/2013  . Pneumonia 12/13/2013  . CAP (community acquired pneumonia) 12/12/2013  . Encounter for therapeutic drug monitoring 08/29/2013  . Aortic stenosis, severe 08/05/2013  . Essential  hypertension 08/05/2013  . Edema extremities 08/05/2013  . Bradycardia 11/27/2011    Malaysia Crance, PT 18-Nov-2015, 4:24 PM  [facility] [address] [place], [place], [address] Phone: [phone]   Fax:  [phone]  Name: Alexa Mcdonald MRN: [id number] Date of Birth: 1921/08/03

## 2015-10-31 ENCOUNTER — Encounter (HOSPITAL_COMMUNITY): Payer: Self-pay | Admitting: Cardiovascular Disease

## 2015-10-31 ENCOUNTER — Encounter (HOSPITAL_COMMUNITY)
Admission: RE | Disposition: A | Discharge status: Home / Self Care | Payer: Self-pay | Source: Ambulatory Visit | Attending: Cardiovascular Disease

## 2015-10-31 ENCOUNTER — Ambulatory Visit (HOSPITAL_COMMUNITY)
Admission: RE | Admit: 2015-10-31 | Discharge: 2015-10-31 | Disposition: A | Discharge status: Home / Self Care | Payer: Medicare Other | Source: Ambulatory Visit | Attending: Cardiovascular Disease | Admitting: Cardiovascular Disease

## 2015-10-31 DIAGNOSIS — I447 Left bundle-branch block, unspecified: Secondary | ICD-10-CM | POA: Diagnosis not present

## 2015-10-31 DIAGNOSIS — I272 Other secondary pulmonary hypertension: Secondary | ICD-10-CM | POA: Insufficient documentation

## 2015-10-31 DIAGNOSIS — E1122 Type 2 diabetes mellitus with diabetic chronic kidney disease: Secondary | ICD-10-CM | POA: Diagnosis not present

## 2015-10-31 DIAGNOSIS — I495 Sick sinus syndrome: Secondary | ICD-10-CM | POA: Diagnosis not present

## 2015-10-31 DIAGNOSIS — Z880 Allergy status to penicillin: Secondary | ICD-10-CM | POA: Insufficient documentation

## 2015-10-31 DIAGNOSIS — N183 Chronic kidney disease, stage 3 (moderate): Secondary | ICD-10-CM | POA: Diagnosis not present

## 2015-10-31 DIAGNOSIS — K219 Gastro-esophageal reflux disease without esophagitis: Secondary | ICD-10-CM | POA: Insufficient documentation

## 2015-10-31 DIAGNOSIS — I2584 Coronary atherosclerosis due to calcified coronary lesion: Secondary | ICD-10-CM | POA: Insufficient documentation

## 2015-10-31 DIAGNOSIS — I340 Nonrheumatic mitral (valve) insufficiency: Secondary | ICD-10-CM | POA: Insufficient documentation

## 2015-10-31 DIAGNOSIS — E559 Vitamin D deficiency, unspecified: Secondary | ICD-10-CM | POA: Insufficient documentation

## 2015-10-31 DIAGNOSIS — I480 Paroxysmal atrial fibrillation: Secondary | ICD-10-CM | POA: Diagnosis not present

## 2015-10-31 DIAGNOSIS — I129 Hypertensive chronic kidney disease with stage 1 through stage 4 chronic kidney disease, or unspecified chronic kidney disease: Secondary | ICD-10-CM | POA: Diagnosis not present

## 2015-10-31 DIAGNOSIS — I440 Atrioventricular block, first degree: Secondary | ICD-10-CM | POA: Diagnosis not present

## 2015-10-31 DIAGNOSIS — Z8249 Family history of ischemic heart disease and other diseases of the circulatory system: Secondary | ICD-10-CM | POA: Insufficient documentation

## 2015-10-31 DIAGNOSIS — I350 Nonrheumatic aortic (valve) stenosis: Secondary | ICD-10-CM | POA: Insufficient documentation

## 2015-10-31 DIAGNOSIS — Z7901 Long term (current) use of anticoagulants: Secondary | ICD-10-CM | POA: Diagnosis not present

## 2015-10-31 DIAGNOSIS — J849 Interstitial pulmonary disease, unspecified: Secondary | ICD-10-CM | POA: Insufficient documentation

## 2015-10-31 DIAGNOSIS — M810 Age-related osteoporosis without current pathological fracture: Secondary | ICD-10-CM | POA: Diagnosis not present

## 2015-10-31 DIAGNOSIS — I2510 Atherosclerotic heart disease of native coronary artery without angina pectoris: Secondary | ICD-10-CM

## 2015-10-31 HISTORY — PX: CARDIAC CATHETERIZATION: SHX172

## 2015-10-31 LAB — GLUCOSE, CAPILLARY: Glucose-Capillary: 114 mg/dL — ABNORMAL HIGH (ref 65–99)

## 2015-10-31 LAB — POCT I-STAT 3, VENOUS BLOOD GAS (G3P V)
Acid-base deficit: 1 mmol/L (ref 0.0–2.0)
Bicarbonate: 24.9 mEq/L — ABNORMAL HIGH (ref 20.0–24.0)
O2 Saturation: 59 %
TCO2: 26 mmol/L (ref 0–100)
pCO2, Ven: 43.3 mmHg — ABNORMAL LOW (ref 45.0–50.0)
pH, Ven: 7.367 — ABNORMAL HIGH (ref 7.250–7.300)
pO2, Ven: 32 mmHg (ref 31.0–45.0)

## 2015-10-31 LAB — POCT I-STAT 3, ART BLOOD GAS (G3+)
Acid-base deficit: 1 mmol/L (ref 0.0–2.0)
Bicarbonate: 24.3 mEq/L — ABNORMAL HIGH (ref 20.0–24.0)
O2 Saturation: 90 %
TCO2: 26 mmol/L (ref 0–100)
pCO2 arterial: 40.9 mmHg (ref 35.0–45.0)
pH, Arterial: 7.382 (ref 7.350–7.450)
pO2, Arterial: 60 mmHg — ABNORMAL LOW (ref 80.0–100.0)

## 2015-10-31 LAB — PROTIME-INR
INR: 1.22 (ref 0.00–1.49)
Prothrombin Time: 15.6 seconds — ABNORMAL HIGH (ref 11.6–15.2)

## 2015-10-31 SURGERY — RIGHT/LEFT HEART CATH AND CORONARY ANGIOGRAPHY
Anesthesia: LOCAL

## 2015-10-31 MED ORDER — ONDANSETRON HCL 4 MG/2ML IJ SOLN: 4.0000 mg | Freq: Four times a day (QID) | INTRAMUSCULAR | Status: DC | PRN

## 2015-10-31 MED ORDER — SODIUM CHLORIDE 0.9 % IV SOLN: INTRAVENOUS | Status: DC

## 2015-10-31 MED ORDER — HEPARIN (PORCINE) IN NACL 2-0.9 UNIT/ML-% IJ SOLN
INTRAMUSCULAR | Status: AC
  Filled 2015-10-31: qty 1000

## 2015-10-31 MED ORDER — HEPARIN (PORCINE) IN NACL 2-0.9 UNIT/ML-% IJ SOLN
INTRAMUSCULAR | Status: DC | PRN
  Administered 2015-10-31: 1000 mL

## 2015-10-31 MED ORDER — ACETAMINOPHEN 325 MG PO TABS: 650.0000 mg | ORAL_TABLET | ORAL | Status: DC | PRN

## 2015-10-31 MED ORDER — SODIUM CHLORIDE 0.9 % IV SOLN
INTRAVENOUS | Status: DC
  Administered 2015-10-31: 08:00:00 via INTRAVENOUS

## 2015-10-31 MED ORDER — SODIUM CHLORIDE 0.9 % IV SOLN: 250.0000 mL | INTRAVENOUS | Status: DC | PRN

## 2015-10-31 MED ORDER — SODIUM CHLORIDE 0.9% FLUSH
3.0000 mL | Freq: Two times a day (BID) | INTRAVENOUS | Status: DC
Start: 2015-10-31 — End: 2015-10-31

## 2015-10-31 MED ORDER — VERAPAMIL HCL 2.5 MG/ML IV SOLN
INTRAVENOUS | Status: AC
  Filled 2015-10-31: qty 2

## 2015-10-31 MED ORDER — IOPAMIDOL (ISOVUE-370) INJECTION 76%
INTRAVENOUS | Status: DC | PRN
  Administered 2015-10-31: 60 mL

## 2015-10-31 MED ORDER — MIDAZOLAM HCL 2 MG/2ML IJ SOLN
INTRAMUSCULAR | Status: DC | PRN
  Administered 2015-10-31: 1 mg via INTRAVENOUS

## 2015-10-31 MED ORDER — LIDOCAINE HCL (PF) 1 % IJ SOLN
INTRAMUSCULAR | Status: DC | PRN
  Administered 2015-10-31: 25 mL via SUBCUTANEOUS

## 2015-10-31 MED ORDER — LIDOCAINE HCL (PF) 1 % IJ SOLN
INTRAMUSCULAR | Status: AC
  Filled 2015-10-31: qty 30

## 2015-10-31 MED ORDER — SODIUM CHLORIDE 0.9% FLUSH: 3.0000 mL | INTRAVENOUS | Status: DC | PRN

## 2015-10-31 MED ORDER — FENTANYL CITRATE (PF) 100 MCG/2ML IJ SOLN
INTRAMUSCULAR | Status: DC | PRN
  Administered 2015-10-31: 25 ug via INTRAVENOUS

## 2015-10-31 MED ORDER — IOPAMIDOL (ISOVUE-370) INJECTION 76%
INTRAVENOUS | Status: AC
  Filled 2015-10-31: qty 100

## 2015-10-31 MED ORDER — FENTANYL CITRATE (PF) 100 MCG/2ML IJ SOLN
INTRAMUSCULAR | Status: AC
  Filled 2015-10-31: qty 2

## 2015-10-31 MED ORDER — MIDAZOLAM HCL 2 MG/2ML IJ SOLN
INTRAMUSCULAR | Status: AC
  Filled 2015-10-31: qty 2

## 2015-10-31 MED ORDER — SODIUM CHLORIDE 0.9 % IV SOLN
250.0000 mL | INTRAVENOUS | Status: DC | PRN
Start: 2015-10-31 — End: 2015-10-31

## 2015-10-31 MED ORDER — SODIUM CHLORIDE 0.9% FLUSH: 3.0000 mL | Freq: Two times a day (BID) | INTRAVENOUS | Status: DC

## 2015-10-31 SURGICAL SUPPLY — 10 items
CATH INFINITI MULTIPACK ANG 4F (CATHETERS) ×2 IMPLANT
CATH SWAN GANZ 7F STRAIGHT (CATHETERS) ×1 IMPLANT
KIT HEART LEFT (KITS) ×2 IMPLANT
PACK CARDIAC CATHETERIZATION (CUSTOM PROCEDURE TRAY) ×2 IMPLANT
SHEATH PINNACLE 4F 10CM (SHEATH) ×2 IMPLANT
SHEATH PINNACLE 7F 10CM (SHEATH) ×2 IMPLANT
SYR MEDRAD MARK V 150ML (SYRINGE) ×2 IMPLANT
TRANSDUCER W/STOPCOCK (MISCELLANEOUS) ×3 IMPLANT
TUBING CIL FLEX 10 FLL-RA (TUBING) ×2 IMPLANT
WIRE EMERALD 3MM-J .035X150CM (WIRE) ×1 IMPLANT

## 2015-10-31 NOTE — H&P (View-Only) (Signed)
Cardiology Office Note Date:  10/15/2015   ID:  Alexa Mcdonald, DOB 07-Apr-1921, MRN [id number]  PCP:  [name], MD  Cardiologist:  [name], MD    Chief Complaint  Patient presents with  . TAVR consult    Denies any chest pain, lee, sob, or claudication     History of Present Illness: Alexa Mcdonald is a 82 y.o. female who presents for Follow-up of severe aortic stenosis. The patient was initially seen in November 2015 after undergoing evaluation earlier that year by Dr. [name]. She had been noted to have a long-standing heart murmur and was found to have progressive aortic stenosis by surveillance ultrasound. Echo findings were consistent with critical aortic stenosis with peak and mean transvalvular gradients of 115 and 74 mmHg, respectively. Her calculated aortic valve area is 0.5 cm. At the time she elected to continue with medical therapy because of absence of cardiopulmonary symptoms.  She was recently seen by Dr. [name]. She has begun to develop symptoms of exertional dyspnea and dizziness and the patient is reconsidering TAVR as a treatment option. Over the past month she has developed exertional dyspnea with moderate level activities. She's had to stop and rest when walking especially when any significant distance is involved. She has not had resting shortness of breath, orthopnea, PND, or leg swelling. She also experiences lightheadedness on an episodic basis. She has had no exertional chest pain or pressure. She otherwise has felt well and denies any other specific limitation. She remains functionally independent, lives alone, and still operates her car and gets out for grocery shopping. The patient is here with her granddaughter today.    Past Medical History  Diagnosis Date  . Mediastinal goiter     a. 1988: s/p resection.  . CKD (chronic kidney disease), stage III   . Osteopenia   . Osteoporosis   . Vitamin D deficiency   . Paroxysmal atrial fibrillation  (HCC)     a. CHA2DS2VASc = 5-->coumadin;  b. previously on amio->d/c'd 11/2014 secondary to concern for possible amio lung.  . Essential hypertension   . LBBB (left bundle branch block)   . Severe aortic stenosis     a. 06/2014 Echo: EF 50-55%, normal wall motion, Gr 2 DD, triv AI, Sev AS (mean grad 32mmHg, valve area - 0.5cm^2 VTI), mod-sev MR, sev dil LA.[name]  Patient was evaluated by Dr. [name] for TAVR but is not interested.  [name] Hx of orthostatic hypotension   . Colon, diverticulosis   . Tachycardia-bradycardia ([place])   . Diabetes mellitus     diet controlled  . Esophageal reflux   . GERD (gastroesophageal reflux disease)   . Mitral regurgitation     a. 06/2014 Echo: mod-severe MR.  . 1St degree AV block     Past Surgical History  Procedure Laterality Date  . Cataract extraction    . Carpal tunnel release    . Mri  08/18/07    head (diabetes heart [facility] at [facility])  . Mediastinal removal of a goiter    . Cesarean section      x3    Current Outpatient Prescriptions  Medication Sig Dispense Refill  . acetaminophen (TYLENOL) 500 MG tablet Take 500 mg by mouth every 6 (six) hours as needed for moderate pain.    . Calcium Carb-Cholecalciferol (CALCIUM 600/VITAMIN D3 PO) Take 1 tablet by mouth 2 (two) times daily.    [name] lisinopril (PRINIVIL,ZESTRIL) 20 MG tablet Take 1 tablet (20 mg total) by  mouth 2 (two) times daily. 60 tablet 11  . methimazole (TAPAZOLE) 5 MG tablet Take 5 mg by mouth daily.    . Multiple Vitamins-Minerals (OCUVITE PRESERVISION PO) Take 1 capsule by mouth daily.     . pravastatin (PRAVACHOL) 10 MG tablet Take 10 mg by mouth at bedtime.     [name] warfarin (COUMADIN) 2.5 MG tablet Take as directed by [facility] 45 tablet 3   No current facility-administered medications for this visit.    Allergies:   Amoxicillin and Celebrex   Social History:  The patient  reports that she has never smoked. She has never used smokeless tobacco. She reports that she does not drink  alcohol or use illicit drugs.   Family History:  The patient's  family history includes Arrhythmia in her brother; CAD in her brother; Cancer - Prostate in her father; Diabetes in her mother and sister; Hypertension in her brother and sister; Pulmonary embolism in her father; Pulmonary fibrosis in her brother.    ROS:  Please see the history of present illness.  All other systems are reviewed and negative.    PHYSICAL EXAM: VS:  BP 134/70 mmHg  Pulse 72  Ht 5' 0.5" (1.537 m)  Wt 132 lb 9.6 oz (60.147 kg)  BMI 25.46 kg/m2 , BMI Body mass index is 25.46 kg/(m^2). GEN: Well nourished, well developed, pleasant elderly woman in no acute distress HEENT: normal Neck: no JVD, no masses. Delayed carotid upstrokes bilaterally Cardiac: RRR with grade 3/6 late peaking harsh crescendo decrescendo murmur at the right upper sternal border and grade 3/6 holosystolic murmur at the apex       Respiratory:  clear to auscultation bilaterally, normal work of breathing GI: soft, nontender, nondistended, + BS MS: no deformity or atrophy Ext: no pretibial edema, pedal pulses 2+= bilaterally Skin: warm and dry, no rash Neuro:  Strength and sensation are intact Psych: euthymic mood, full affect  EKG:  EKG is not ordered today.  Recent Labs: No results found for requested labs within last 365 days.   Lipid Panel  No results found for: CHOL, TRIG, HDL, CHOLHDL, VLDL, LDLCALC, LDLDIRECT    Wt Readings from Last 3 Encounters:  10/15/15 132 lb 9.6 oz (60.147 kg)  10/03/15 134 lb (60.782 kg)  08/09/15 135 lb (61.236 kg)     Cardiac Studies Reviewed: 2D Echo 06/19/2014: Study Conclusions  - Left ventricle: The cavity size was normal. Wall thickness was increased in a pattern of mild LVH. Systolic function was low normal to mildly reduced. The estimated ejection fraction was in the range of 50% to 55%. Wall motion was normal; there were no regional wall motion abnormalities. Features are  consistent with a pseudonormal left ventricular filling pattern, with concomitant abnormal relaxation and increased filling pressure (grade 2 diastolic dysfunction). L wave in PW doppler tracing of mitral inflow suggests elevated LV filling pressure. - Aortic valve: Trileaflet; severely calcified leaflets. There was trivial regurgitation. Mean gradient (S): 71 mm Hg. Valve area (VTI): 0.5 cm^2. - Mitral valve: Severely calcified annulus. Mildly calcified leaflets . There was moderate to severe regurgitation. - Left atrium: The atrium was severely dilated. - Right ventricle: The cavity size was normal. Systolic function was normal. - Tricuspid valve: Peak RV-RA gradient (S): 43 mm Hg. - Systemic veins: The IVC was poorly visualized.  Impressions:  - Normal LV size with mild LV hypertrophy. EF 50-55% (low normal to mildly reduced). Moderate diastolic dysfunction with an L wave in the mitral inflow  PW doppler tracing. Severe aortic stenosis. There was moderate to severe MR (potentially could improve with correction of AS). Mild pulmonary hypertension. Normal RV size and systolic function.  ASSESSMENT AND PLAN: 82 yo woman with Stage D, severe symptomatic aortic stenosis. She notes progressive symptoms of shortness of breath and lightheadedness.  At the time of her most recent echocardiogram in 2015 she was noted to have critical aortic stenosis with a mean transvalvular gradient of 71 mmHg. She was also noted to have moderate to severe mitral regurgitation. She had deferred treatment because she had no cardiac symptoms at that time. However, she now clearly has symptomatic aortic stenosis. I have reviewed the natural history of aortic stenosis with the patient and her granddaughter who is also present today. We have discussed the limitations of medical therapy and the poor prognosis associated with symptomatic aortic stenosis. We have reviewed potential treatment  options, including palliative medical therapy, conventional surgical aortic valve replacement, and transcatheter aortic valve replacement. We discussed treatment options in the context of this patient's specific comorbid medical conditions. The patient has been seen by Dr [name] and she is not a candidate for conventional aortic valve replacement with her advanced age of 34 years and other comorbid medical conditions. I think TAVR is a reasonable treatment option for her considering her independence and fairly good functional capacity.   As it has been over one year since her most recent echo, I have recommended an updated echo study followed by right and left heart catheterization, CT angiogram of the chest, abdomen, and pelvis, and gated CT angiogram of the heart. After those studies are completed she will be referred back to Dr. [name] as part of her multidisciplinary evaluation.  I have reviewed the risks, indications, and alternatives to cardiac catheterization, possible angioplasty, and stenting with the patient. Risks include but are not limited to bleeding, infection, vascular injury, stroke, myocardial infection, arrhythmia, kidney injury, radiation-related injury in the case of prolonged fluoroscopy use, emergency cardiac surgery, and death. The patient understands the risks of serious complication is 1-2 in [id number] with diagnostic cardiac cath and 1-2% or less with angioplasty/stenting. She will be instructed to hold warfarin 5 days prior to the procedure.   I have reviewed notes from Dr. Lake Bells related to the patient's interstitial lung disease. She is felt to have chest CT findings consistent with usual interstitial pneumonitis. I have reviewed her PFTs which show an FEV1 of 1.48 and diffusion capacity of 69%. These values are stable/improved from her previous. She is felt to be stable with respect to her lung disease and one year follow-up is planned. I don't think this would be prohibitive with  respect to general anesthesia or TAVR.  Current medicines are reviewed with the patient today.  The patient does not have concerns regarding medicines.  Labs/ tests ordered today include:  No orders of the defined types were placed in this encounter.    Disposition:  Follow-up as outlined above  Signed, [name], MD  10/15/2015 11:56 AM    [address] [place], [place], [place]  [address] Phone: [phone]; Fax: [phone]

## 2015-10-31 NOTE — Progress Notes (Signed)
Site area: rt groin Site Prior to Removal:  Level 0 Pressure Applied For:  20 minutes Manual:   yes Patient Status During Pull:  stable Post Pull Site:  Level  0 Post Pull Instructions Given:  yes Post Pull Pulses Present: yes Dressing Applied:  tegaderm Bedrest begins @ [id number] Comments:

## 2015-10-31 NOTE — Interval H&P Note (Signed)
History and Physical Interval Note:  10/31/2015 10:18 AM  Alexa Mcdonald  has presented today for surgery, with the diagnosis of aortic stenosis, pre tavr  The various methods of treatment have been discussed with the patient and family. After consideration of risks, benefits and other options for treatment, the patient has consented to  Procedure(s): Right/Left Heart Cath and Coronary Angiography (N/A) as a surgical intervention .  The patient's history has been reviewed, patient examined, no change in status, stable for surgery.  I have reviewed the patient's chart and labs.  Questions were answered to the patient's satisfaction.     [name]

## 2015-10-31 NOTE — Discharge Instructions (Signed)
Angiogram, Care After °Refer to this sheet in the next few weeks. These instructions provide you with information about caring for yourself after your procedure. Your health care provider may also give you more specific instructions. Your treatment has been planned according to current medical practices, but problems sometimes occur. Call your health care provider if you have any problems or questions after your procedure. °WHAT TO EXPECT AFTER THE PROCEDURE °After your procedure, it is typical to have the following: °· Bruising at the catheter insertion site that usually fades within 1-2 weeks. °· Blood collecting in the tissue (hematoma) that may be painful to the touch. It should usually decrease in size and tenderness within 1-2 weeks. °HOME CARE INSTRUCTIONS °· Take medicines only as directed by your health care provider. °· You may shower 24-48 hours after the procedure or as directed by your health care provider. Remove the bandage (dressing) and gently wash the site with plain soap and water. Pat the area dry with a clean towel. Do not rub the site, because this may cause bleeding. °· Do not take baths, swim, or use a hot tub until your health care provider approves. °· Check your insertion site every day for redness, swelling, or drainage. °· Do not apply powder or lotion to the site. °· Do not lift over 10 lb (4.5 kg) for 5 days after your procedure or as directed by your health care provider. °· Ask your health care provider when it is okay to: °¨ Return to work or school. °¨ Resume usual physical activities or sports. °¨ Resume sexual activity. °· Do not drive home if you are discharged the same day as the procedure. Have someone else drive you. °· You may drive 24 hours after the procedure unless otherwise instructed by your health care provider. °· Do not operate machinery or power tools for 24 hours after the procedure or as directed by your health care provider. °· If your procedure was done as an  outpatient procedure, which means that you went home the same day as your procedure, a responsible adult should be with you for the first 24 hours after you arrive home. °· Keep all follow-up visits as directed by your health care provider. This is important. °SEEK MEDICAL CARE IF: °· You have a fever. °· You have chills. °· You have increased bleeding from the catheter insertion site. Hold pressure on the site. °SEEK IMMEDIATE MEDICAL CARE IF: °· You have unusual pain at the catheter insertion site. °· You have redness, warmth, or swelling at the catheter insertion site. °· You have drainage (other than a small amount of blood on the dressing) from the catheter insertion site. °· The catheter insertion site is bleeding, and the bleeding does not stop after 30 minutes of holding steady pressure on the site. °· The area near or just beyond the catheter insertion site becomes pale, cool, tingly, or numb. °  °This information is not intended to replace advice given to you by your health care provider. Make sure you discuss any questions you have with your health care provider. °  °Document Released: 02/06/2005 Document Revised: 08/11/2014 Document Reviewed: 12/22/2012 °[employer] Interactive Patient Education ©2016 [employer] ° °

## 2015-11-05 ENCOUNTER — Encounter: Payer: Self-pay | Admitting: Thoracic Surgery (Cardiothoracic Vascular Surgery)

## 2015-11-05 ENCOUNTER — Other Ambulatory Visit: Payer: Self-pay | Admitting: *Deleted

## 2015-11-05 ENCOUNTER — Institutional Professional Consult (permissible substitution) (INDEPENDENT_AMBULATORY_CARE_PROVIDER_SITE_OTHER): Payer: Medicare Other | Admitting: Thoracic Surgery (Cardiothoracic Vascular Surgery)

## 2015-11-05 VITALS — BP 140/73 | HR 74 | Resp 20 | Ht 65.0 in | Wt 132.0 lb

## 2015-11-05 DIAGNOSIS — Z5181 Encounter for therapeutic drug level monitoring: Secondary | ICD-10-CM | POA: Diagnosis not present

## 2015-11-05 DIAGNOSIS — I480 Paroxysmal atrial fibrillation: Secondary | ICD-10-CM | POA: Diagnosis not present

## 2015-11-05 DIAGNOSIS — I350 Nonrheumatic aortic (valve) stenosis: Secondary | ICD-10-CM

## 2015-11-05 DIAGNOSIS — Z7901 Long term (current) use of anticoagulants: Secondary | ICD-10-CM | POA: Diagnosis not present

## 2015-11-05 NOTE — Progress Notes (Signed)
HE[facility] AND [facility] CONSULTATION REPORT  Referring Provider is [name], MD  Primary Cardiologist is Alexa Margarita, MD PCP is Alexa Coma, MD  Chief Complaint  Patient presents with  . Aortic Stenosis    Surgical eval for TAVR, cardaic cath 10/31/15,ECHO 10/17/15, PFT's 08/09/15, CT's 10/24/15    HPI:  Patient is a 82 year old widowed white female from [place] with severe aortic stenosis, mitral regurgitation, chronic diastolic congestive heart failure, hypertension, paroxysmal atrial fibrillation on long-term anticoagulation using warfarin, stage III chronic kidney disease, and degenerative arthritith osteoporosis who returns to the office today to further discuss surgical treatment options. The patient has been followed for several years by Dr. [name] and echocardiograms have demonstrated the gradual progression of severity of aortic stenosis. She was referred for surgical consultation more than a year and a half ago, and I had the opportunity to see the patient in consultation on 03/03/2014.  The patient was noted to have very severe aortic stenosis but claimed to be relatively asymptomatic at that time. She declined an opportunity to proceed with further diagnostic workup and possible surgery and elected to follow up closely with Dr. [name] on medical therapy.  Over the last several months the patient has developed progressive symptoms of exertional shortness of breath, dizziness, and fatigue. She now gets short of breath with moderate to low-level activities. She has not had any resting shortness of breath, PND, orthopnea, or lower extremity edema. She has never had any chest pain or chest tightness. She was seen in follow-up recent but Dr. [name] and repeat echocardiogram reveals severe aortic stenosis with peak velocity across the aortic valve at times measuring greater than 5 m/s.  Mean transvalvular gradient  estimated 74 mmHg. Left ventricular systolic function remains normal with ejection fraction estimated 55-60%. The patient does have significant mitral annular calcification and at least moderate mitral regurgitation. The patient was referred back to Dr. [name] who performed left and right heart catheterization on 10/31/2015.  The patient was found to have findings consistent with mild non-obstructive coronary artery disease and severe aortic stenosis with aortic valve cross-sectional area calculated 0.8 cm.  Pulmonary artery pressures were mildly elevated, reported 42/20 with mean pulmonary artery wedge pressure only 13. The patient was referred for cardiac surgical consultation.  Patient is widowed and still lives alone in her private home in [place]. She continues to drive an automobile and remains functionally independent. She has a son who lives locally in [place]. She is accompanied to her office consultation today by her daughter-in-law.  She has 2 other adult children who do not live locally.  She lives a sedentary lifestyle but remains completely independent. Her mobility is limited primarily by exertional shortness of breath and fatigue. She admits that she has been slowing down progressively over the last year or 2.   Past Medical History  Diagnosis Date  . Mediastinal goiter      recurrent s/p resection 1988  . CKD (chronic kidney disease), stage III   . Osteopenia   . Osteoporosis   . Vitamin D deficiency   . Paroxysmal atrial fibrillation (HCC)     a. CHA2DS2VASc = 5-->coumadin;  b. previously on amio->d/c'd 11/2014 secondary to concern for possible amio lung.  . Essential hypertension   . LBBB (left bundle branch block)   . Severe aortic stenosis   . Hx of orthostatic hypotension   . Colon, diverticulosis   . Tachycardia-bradycardia ([place])   .  Diabetes mellitus     diet controlled  . Esophageal reflux   . GERD (gastroesophageal reflux disease)   . Mitral regurgitation   .  1St degree AV block     Past Surgical History  Procedure Laterality Date  . Cataract extraction    . Carpal tunnel release    . Mri  08/18/07    head (diabetes heart [facility] at [facility])  . Mediastinal removal of a goiter    . Cesarean section      x3  . Cardiac catheterization N/A 10/31/2015    Procedure: Right/Left Heart Cath and Coronary Angiography;  Surgeon: [name], MD;  Location: [facility];  Service: Cardiovascular;  Laterality: N/A;    Family History  Problem Relation Age of Onset  . Diabetes Mother   . Cancer - Prostate Father   . Pulmonary embolism Father   . Hypertension Sister   . Diabetes Sister   . Hypertension Brother   . Arrhythmia Brother     afib  . CAD Brother   . Pulmonary fibrosis Brother     Social History   Social History  . Marital Status: Widowed    Spouse Name: N/A  . Number of Children: 3  . Years of Education: N/A   Occupational History  . retire    Social History Main Topics  . Smoking status: Never Smoker   . Smokeless tobacco: Never Used  . Alcohol Use: No  . Drug Use: No  . Sexual Activity: Not on file   Other Topics Concern  . Not on file   Social History Narrative   Lives in [place].  Widowed.    Current Outpatient Prescriptions  Medication Sig Dispense Refill  . acetaminophen (TYLENOL) 500 MG tablet Take 500 mg by mouth every 6 (six) hours as needed for moderate pain.    . Calcium Carb-Cholecalciferol (CALCIUM 600/VITAMIN D3 PO) Take 1 tablet by mouth 2 (two) times daily.    [name] lisinopril (PRINIVIL,ZESTRIL) 20 MG tablet Take 1 tablet (20 mg total) by mouth 2 (two) times daily. 60 tablet 11  . methimazole (TAPAZOLE) 5 MG tablet Take 5 mg by mouth daily.    . Multiple Vitamins-Minerals (OCUVITE PRESERVISION PO) Take 1 capsule by mouth daily.     . pravastatin (PRAVACHOL) 10 MG tablet Take 10 mg by mouth at bedtime.     [name] warfarin (COUMADIN) 2.5 MG tablet Take as directed by [facility] (Patient taking  differently: Take 2.5-5 mg by mouth daily. 5mg  on Tuesdays and Saturdays, 2.5mg  all other days) 45 tablet 3   No current facility-administered medications for this visit.    Allergies  Allergen Reactions  . Amoxicillin Other (See Comments)    tongue felt bruised.  . Celebrex [Celecoxib] Other (See Comments)    Mouth sores      Review of Systems:   General:  marginal appetite, decreased energy, no weight gain, no weight loss, no fever  Cardiac:  no chest pain with exertion, no chest pain at rest, + SOB with exertion, no resting SOB, no PND, no orthopnea, no palpitations, + arrhythmia, + atrial fibrillation, + LE edema, + dizzy spells, no syncope  Respiratory:  + shortness of breath, no home oxygen, no productive cough, no dry cough, no bronchitis, no wheezing, no hemoptysis, no asthma, no pain with inspiration or cough, no sleep apnea, no CPAP at night  GI:   no difficulty swallowing, no reflux, no frequent heartburn, no hiatal hernia, no abdominal pain,  no constipation, no diarrhea, no hematochezia, no hematemesis, no melena  GU:   no dysuria,  no frequency, no urinary tract infection, no hematuria, no kidney stones, no kidney disease  Vascular:  no pain suggestive of claudication, no pain in feet, no leg cramps, no varicose veins, no DVT, no non-healing foot ulcer  Neuro:   no stroke, no TIA's, no seizures, no headaches, notemporary blindness one eye,  no slurred speech, no peripheral neuropathy, no chronic pain, mild instability of gait, mild memory/cognitive dysfunction  Musculoskeletal: + arthritis, no joint swelling, no myalgias, mild difficulty walking, slightly limited mobility   Skin:   no rash, no itching, no skin infections, no pressure sores or ulcerations  Psych:   no anxiety, no depression, no nervousness, no unusual recent stress  Eyes:   no blurry vision, no floaters, no recent vision changes, + wears glasses or contacts  ENT:   + hearing loss, no loose or painful teeth, no  dentures, last saw dentist last year  Hematologic:  + easy bruising, no abnormal bleeding, no clotting disorder, no frequent epistaxis  Endocrine:  + diabetes, does check CBG's at home           Physical Exam:   BP 140/73 mmHg  Pulse 74  Resp 20  Ht 5\' 5"  (1.651 m)  Wt 132 lb (59.875 kg)  BMI 21.97 kg/m2  SpO2 98%  General:  mildy obese, elderly, o/w  well-appearing  HEENT:  Unremarkable   Neck:   no JVD, no bruits, no adenopathy   Chest:   clear to auscultation, symmetrical breath sounds, no wheezes, no rhonchi   CV:   Irregular rate and rhythm, grade IV/VI crescendo/decrescendo murmur,  no diastolic murmur  Abdomen:  soft, non-tender, no masses   Extremities:  warm, well-perfused, pulses diminished but palpable, no LE edema  Rectal/GU  Deferred  Neuro:   Grossly non-focal and symmetrical throughout  Skin:   Clean and dry, no rashes, no breakdown   Diagnostic Tests:  Transthoracic Echocardiography  Patient: Alexa Mcdonald, Alexa Mcdonald MR #: [id number] Study Date: 10/17/2015 Gender: F Age: 29 Height: 153.7 cm Weight: 60.1 kg BSA: 1.62 m^2 Pt. Status: Room:  Meribeth Mattes, MD [place], [place], M.D. SONOGRAPHER Cindy Hazy, RDCS PERFORMING [facility], Outpatient  cc:  ------------------------------------------------------------------- LV EF: 55% - 60%  ------------------------------------------------------------------- Indications: I35.9 Aortic Valve Disorder.  ------------------------------------------------------------------- History: PMH: Acquired from the patient and from the patient&'s chart. PMH: Paroxysmal Atrial Fibrillation. Chronic Kidney disease. LBBB. Tachy-Brady Syndrome. Risk factors: Diabetes mellitus.  ------------------------------------------------------------------- Study Conclusions  - Left ventricle: The cavity size was  normal. Wall thickness was  increased in a pattern of mild LVH. Systolic function was normal.  The estimated ejection fraction was in the range of 55% to 60%.  Septal-lateral dyssynchrony. Wall motion was normal; there were  no regional wall motion abnormalities. Features are consistent  with a pseudonormal left ventricular filling pattern, with  concomitant abnormal relaxation and increased filling pressure  (grade 2 diastolic dysfunction). E/medial e&' > 15 suggests LV end  diastolic pressure at least 20 mmHg. - Aortic valve: Probably trileaflet; severely calcified leaflets.  There was severe stenosis. There was moderate regurgitation. Mean  gradient (S): 74 mm Hg. Valve area (VTI): 0.51 cm^2. - Mitral valve: Moderately calcified annulus. Mildly calcified  leaflets . There was moderate to severe regurgitation. - Left atrium: The atrium was severely dilated. - Right ventricle: The cavity size was normal. Systolic function  was normal. -  Tricuspid valve: Peak RV-RA gradient (S): 62 mm Hg. - Pulmonary arteries: PA peak pressure: 65 mm Hg (S). - Inferior vena cava: The vessel was normal in size. The  respirophasic diameter changes were in the normal range (>= 50%),  consistent with normal central venous pressure.  Impressions:  - Normal LV size with mild LV hypertrophy. EF 55-60% with  septal-lateral dyssynchrony. Normal RV size and systolic  function. Severe aortic stenosis, moderate aortic insufficiency,  moderate to severe mitral regurgitation. Moderate pulmonary  hypertension.  Transthoracic echocardiography. M-mode, complete 2D, spectral Doppler, and color Doppler. Birthdate: Patient birthdate: 1920/09/10. Age: Patient is 82 yr old. Sex: Gender: female. BMI: 25.5 kg/m^2. Blood pressure: 134/70 Patient status: Outpatient. Study date: Study date: 10/17/2015. Study time: 01:32 PM. Location: [facility]  3  -------------------------------------------------------------------  ------------------------------------------------------------------- Left ventricle: The cavity size was normal. Wall thickness was increased in a pattern of mild LVH. Systolic function was normal. The estimated ejection fraction was in the range of 55% to 60%. Septal-lateral dyssynchrony. Wall motion was normal; there were no regional wall motion abnormalities. Features are consistent with a pseudonormal left ventricular filling pattern, with concomitant abnormal relaxation and increased filling pressure (grade 2 diastolic dysfunction). E/medial e&' > 15 suggests LV end diastolic pressure at least 20 mmHg.  ------------------------------------------------------------------- Aortic valve: Probably trileaflet; severely calcified leaflets. Doppler: There was severe stenosis. There was moderate regurgitation. VTI ratio of LVOT to aortic valve: 0.18. Valve area (VTI): 0.51 cm^2. Indexed valve area (VTI): 0.31 cm^2/m^2. Peak velocity ratio of LVOT to aortic valve: 0.19. Valve area (Vmax): 0.54 cm^2. Indexed valve area (Vmax): 0.33 cm^2/m^2. Mean velocity ratio of LVOT to aortic valve: 0.19. Valve area (Vmean): 0.55 cm^2. Indexed valve area (Vmean): 0.34 cm^2/m^2. Mean gradient (S): 74 mm Hg. Peak gradient (S): 110 mm Hg.  ------------------------------------------------------------------- Aorta: Aortic root: The aortic root was normal in size. Ascending aorta: The ascending aorta was normal in size.  ------------------------------------------------------------------- Mitral valve: Moderately calcified annulus. Mildly calcified leaflets . Doppler: There was no evidence for stenosis. There was moderate to severe regurgitation. Peak gradient (D): 13 mm Hg.  ------------------------------------------------------------------- Left atrium: The atrium was severely  dilated.  ------------------------------------------------------------------- Right ventricle: The cavity size was normal. Systolic function was normal.  ------------------------------------------------------------------- Pulmonic valve: Structurally normal valve. Cusp separation was normal. Doppler: Transvalvular velocity was within the normal range. There was no regurgitation.  ------------------------------------------------------------------- Tricuspid valve: Doppler: There was mild regurgitation.  ------------------------------------------------------------------- Right atrium: The atrium was normal in size.  ------------------------------------------------------------------- Pericardium: There was no pericardial effusion.  ------------------------------------------------------------------- Systemic veins: Inferior vena cava: The vessel was normal in size. The respirophasic diameter changes were in the normal range (>= 50%), consistent with normal central venous pressure.  ------------------------------------------------------------------- Measurements  Left ventricle Value Reference LV ID, ED, PLAX chordal (L) 38.1 mm 43 - 52 LV ID, ES, PLAX chordal 28.4 mm 23 - 38 LV fx shortening, PLAX chordal (L) 25 % >=29 LV PW thickness, ED 11.9 mm --------- IVS/LV PW ratio, ED 1.14 <=1.3 Stroke volume, 2D 74 ml --------- Stroke volume/bsa, 2D 46 ml/m^2 --------- LV e&', lateral 7.68 cm/s --------- LV E/e&', lateral 23.31 --------- LV e&', medial 5.37 cm/s --------- LV E/e&', medial 33.33 --------- LV  e&', average 6.53 cm/s --------- LV E/e&', average 27.43 ---------  Ventricular septum Value Reference IVS thickness, ED 13.6 mm ---------  LVOT Value Reference LVOT ID, S 19 mm --------- LVOT area 2.84 cm^2 --------- LVOT ID 19 mm --------- LVOT peak velocity, S 99.2 cm/s --------- LVOT  mean velocity, S 78.7 cm/s --------- LVOT VTI, S 26.2 cm --------- LVOT peak gradient, S 4 mm Hg --------- Stroke volume (SV), LVOT DP 74.3 ml --------- Stroke index (SV/bsa), LVOT DP 45.9 ml/m^2 ---------  Aortic valve Value Reference Aortic valve peak velocity, S 524 cm/s --------- Aortic valve mean velocity, S 407 cm/s --------- Aortic valve VTI, S 147 cm --------- Aortic mean gradient, S 74 mm Hg --------- Aortic peak gradient, S 110 mm Hg --------- VTI ratio, LVOT/AV 0.18 --------- Aortic valve area, VTI 0.51 cm^2 --------- Aortic valve area/bsa, VTI 0.31 cm^2/m^2 --------- Velocity ratio, peak, LVOT/AV 0.19 --------- Aortic valve area, peak velocity 0.54 cm^2 --------- Aortic valve area/bsa, peak 0.33 cm^2/m^2 --------- velocity Velocity ratio, mean, LVOT/AV 0.19 --------- Aortic valve area, mean velocity 0.55 cm^2  --------- Aortic valve area/bsa, mean 0.34 cm^2/m^2 --------- velocity Aortic regurg pressure half-time 391 ms ---------  Aorta Value Reference Aortic root ID, ED 30 mm ---------  Left atrium Value Reference LA ID, A-P, ES 44 mm --------- LA ID/bsa, A-P (H) 2.72 cm/m^2 <=2.2 LA volume, S 126 ml --------- LA volume/bsa, S 77.9 ml/m^2 --------- LA volume, ES, 1-p A4C 99 ml --------- LA volume/bsa, ES, 1-p A4C 61.2 ml/m^2 --------- LA volume, ES, 1-p A2C 146 ml --------- LA volume/bsa, ES, 1-p A2C 90.3 ml/m^2 ---------  Mitral valve Value Reference Mitral E-wave peak velocity 179 cm/s --------- Mitral A-wave peak velocity 132 cm/s --------- Mitral deceleration time 183 ms 150 - 230 Mitral peak gradient, D 13 mm Hg --------- Mitral E/A ratio, peak 1.4 --------- Mitral regurg VTI, PISA 259 cm ---------  Pulmonary arteries Value Reference PA pressure, S, DP (H) 65 mm Hg <=30  Tricuspid valve Value Reference Tricuspid peak RV-RA gradient 61 mm Hg ---------  Right ventricle Value Reference RV s&', lateral, S 10.9 cm/s ---------  Legend: (L) and (H) mark values outside specified reference  range.  ------------------------------------------------------------------- Prepared and Electronically Authenticated by  Loralie Champagne, M.D.    CARDIAC CATHETERIZATION Procedures    Right/Left Heart Cath and Coronary Angiography    Conclusion     Ost LM lesion, 40% stenosed.  Mid Cx lesion, 25% stenosed.  2nd Diag lesion, 40% stenosed.  Dist LAD lesion, 25% stenosed.  1. Severe aortic stenosis with calculated aortic valve area 0.8 cm 2. Mild diffuse nonobstructive coronary artery disease without significant flow-limiting stenosis 3. Well compensated intracardiac hemodynamics with pulmonary wedge pressure of 13, LVEDP 11 and mild pulmonary hypertension with a mean PA pressure 27     Indications    Severe aortic stenosis [I35.0 (ICD-10-CM)]    Technique and Indications    INDICATION: Severe symptomatic aortic stenosis  PROCEDURAL DETAILS: The right groin was prepped, draped, and anesthetized with 1% lidocaine. Using the modified Seldinger technique a 4 French sheath was placed in the right femoral artery and a 7 French sheath was placed in the right femoral vein. A Swan-Ganz catheter was used for the right heart catheterization. Standard protocol was followed for recording of right heart pressures and sampling of oxygen saturations. Fick cardiac output was calculated. Standard Judkins catheters were used for selective coronary angiography and left ventriculography. There were no immediate procedural complications. The patient was transferred to the post catheterization recovery area for further monitoring.  During this procedure the patient is administered a total of Versed 1 mg and Fentanyl 25 mg to achieve and maintain moderate conscious sedation. The patient's heart rate, blood pressure, and oxygen saturation are monitored continuously during the procedure. The period of  conscious sedation is 32 minutes, of which I was present face-to-face 100% of this  time. Estimated blood loss <50 mL. There were no immediate complications during the procedure.    Coronary Findings    Dominance: Right   Left Main   . Ost LM lesion, 40% stenosed. The left main has calcification at the origin. There is an eccentric 30-40% stenosis. There is no pressure dampening with the catheter beyond the lesion.     Left Anterior Descending   . Dist LAD lesion, 25% stenosed. Mild diffuse mid LAD stenosis is present.   . Second Diagonal Branch   . 2nd Diag lesion, 40% stenosed. The second diagonal is large in caliber. The vessel is larger than the mid and distal LAD. There is mild nonobstructive stenosis present.     Left Circumflex   . Mid Cx lesion, 25% stenosed. Calcified diffuse.     Right Coronary Artery  . Vessel is large. This is a large, dominant vessel. The vessel supplies a large PLA and a large PDA. There is mild diffuse nonobstructive plaquing present but no significant stenosis is identified throughout the distribution of the right coronary artery.       Right Heart Pressures Hemodynamic findings consistent with aortic stenosis.    Left Heart    Mitral Valve The annulus is calcified. The mitral annulus is heavily calcified.   Aortic Valve There is severe aortic valve stenosis. The aortic valve is calcified. There is restricted aortic valve motion. There is severe aortic stenosis. The peak to peak gradient is 47 mmHg, mean gradient 32 mmHg, calculated aortic valve area 0.79 cm   Aorta Aortic root injections are performed. There is moderate aortic insufficiency present. The aortic leaflets are calcified and restricted. The proximal ascending aorta appears normal in size.    Coronary Diagrams    Diagnostic Diagram            Implants     No implant documentation for this case.    PACS Images    Show images for Cardiac catheterization     Link to Procedure Log    Procedure Log      Hemo Data       Most Recent  Value   Fick Cardiac Output  4.09 L/min   Fick Cardiac Output Index  2.59 (L/min)/BSA   Aortic Mean Gradient  31.7 mmHg   Aortic Peak Gradient  47 mmHg   Aortic Valve Area  0.79   Aortic Value Area Index  0.5 cm2/BSA   RA A Wave  -99 mmHg   RA V Wave  14 mmHg   RA Mean  10 mmHg   RV Systolic Pressure  38 mmHg   RV Diastolic Pressure  7 mmHg   RV EDP  10 mmHg   PA Systolic Pressure  42 mmHg   PA Diastolic Pressure  20 mmHg   PA Mean  27 mmHg   PW A Wave  -99 mmHg   PW V Wave  15 mmHg   PW Mean  13 mmHg   AO Systolic Pressure  [id number] mmHg   AO Diastolic Pressure  54 mmHg   AO Mean  75 mmHg   LV Systolic Pressure  [id number] mmHg   LV Diastolic Pressure  8 mmHg   LV EDP  11 mmHg   Arterial Occlusion Pressure Extended Systolic Pressure  [id number] mmHg   Arterial Occlusion Pressure Extended Diastolic Pressure  50 mmHg   Arterial Occlusion Pressure Extended Mean Pressure  70 mmHg   Left Ventricular Apex Extended Systolic Pressure  0000000 mmHg   Left Ventricular Apex Extended Diastolic Pressure  7 mmHg   Left Ventricular Apex Extended EDP Pressure  11 mmHg   QP/QS  1   TPVR Index  10.44 HRUI   TSVR Index  29.01 HRUI   PVR SVR Ratio  0.2   TPVR/TSVR Ratio  0.36     Cardiac TAVR CT TECHNIQUE: The patient was scanned on a Philips 256 scanner. A 120 kV retrospective scan was triggered in the descending thoracic aorta at 111 HU's. Gantry rotation speed was 270 msecs and collimation was .9 mm. 5 mg of iv metoprolol and no nitro were given. The 3D data set was reconstructed in 5% intervals of the R-R cycle. Systolic and diastolic phases were analyzed on a dedicated work station using MPR, MIP and VRT modes. The patient received 80 cc of contrast. FINDINGS: Aortic Valve: Trileaflet, severely thickened and calcified with severely restricted leaflet opening. Only mild calcifications present in the subvalvular apparatus in the LVOT. Aorta: Normal size, no dissection.  Moderate diffuse calcifications in the aortic arch and descending thoracic aorta. Sinotubular Junction: 26 x 25 mm Ascending Thoracic Aorta: 33 x 31 mm Aortic Arch: 27 x 26 mm Descending Thoracic Aorta: 28 x 25 mm Sinus of Valsalva Measurements: Non-coronary: 30 mm Right -coronary: 28 mm Left -coronary: 30 mm Coronary Artery Height above Annulus: Left Main: 9.6 mm Right Coronary: 17 mm Virtual Basal Annulus Measurements: Maximum/Minimum Diameter: 27 x 20 mm Perimeter: 83 mm Area: 408 mm2 Optimum Fluoroscopic Angle for Delivery: LAO 1 CAU 1 Pulmonary artery measures 39 x 36 mm suggestive of pulmonary hypertension. IMPRESSION: 1. Trileaflet, severely thickened and calcified with severely restricted leaflet opening with annular measurements suitable for delivery of 23 mm Edward-SAPIEN 3 valve. 2. Sufficient coronary to annulus distance. 3. Optimum Fluoroscopic Angle for Delivery: LAO 1 CAU 1. 4. Dilated pulmonary artery suggestive of pulmonary hypertension. Ena Dawley Electronically Signed  By: Ena Dawley  On: 10/25/2015 14:19     Study Result     EXAM: OVER-READ INTERPRETATION CT CHEST  The following report is an over-read performed by radiologist Dr. Rebekah [facility] on 10/24/2015. This over-read does not include interpretation of cardiac or coronary anatomy or pathology. The coronary calcium score/coronary CTA interpretation by the cardiologist is attached.  COMPARISON: Chest CT 04/25/2009.  FINDINGS: Extracardiac findings will be fully described on report for contemporaneously obtained CTA of the chest, abdomen and pelvis.  IMPRESSION: 1. Please see separate report for dictation of contemporaneously obtained CTA of chest, abdomen and pelvis 10/24/2015 for full description of extracardiac findings.  Electronically Signed: By: Vinnie Langton M.D. On: 10/24/2015 12:41     CT ANGIOGRAPHY CHEST,  ABDOMEN AND PELVIS  TECHNIQUE: Multidetector CT imaging through the chest, abdomen and pelvis was performed using the standard protocol during bolus administration of intravenous contrast. Multiplanar reconstructed images and MIPs were obtained and reviewed to evaluate the vascular anatomy.  CONTRAST: 17mL OMNIPAQUE IOHEXOL 350 MG/ML SOLN  COMPARISON: Chest CT 11/03/2014. CT neck, chest, abdomen and pelvis 04/25/2009.  FINDINGS: CTA CHEST FINDINGS  Mediastinum/Lymph Nodes: Heart size is mildly enlarged. There is a small filling defect in the tip of the left atrial appendage concerning for potential thrombus (although this may simply reflect pseudo thrombus secondary to incomplete mixing of contrast material). There is no significant pericardial fluid, thickening or pericardial calcification. There is atherosclerosis of the thoracic aorta, the great vessels of the  mediastinum and the coronary arteries, including calcified atherosclerotic plaque in the left main, left anterior descending, left circumflex and right coronary arteries. Severe thickening calcification of the aortic valve. Severe mitral annular calcifications. The thyroid gland is again markedly enlarged and heterogeneous in appearance with the largest mass-like component of the thyroid gland in the left lobe measuring up to 5.3 x 6.4 cm, very similar to prior study from 11/03/2014, most compatible with a multinodular goiter. There is apparent high-grade stenosis of the right axillary and subclavian veins, with numerous right-sided chest wall collateral veins. There are multiple borderline enlarged and mildly enlarged mediastinal and bilateral hilar lymph nodes, largest of which measures up to 1.2 cm in the low right paratracheal nodal station. The esophagus is unremarkable in appearance. No axillary lymphadenopathy.  Lungs/Pleura: Compared to the prior examination there is a new 12 x 8 mm (mean diameter of 10  mm) ground-glass attenuation nodule in the superior segment of the left lower lobe (image 18 of series 407). No other larger more suspicious appearing pulmonary nodules or masses are noted. There are areas of subpleural reticulation, parenchymal banding in mild traction bronchiectasis scattered throughout the lungs bilaterally, with a mild craniocaudal gradient. These findings are very similar to prior study 11/03/2014. No acute consolidative airspace disease. No pleural effusions.  Musculoskeletal/Soft Tissues: There are no aggressive appearing lytic or blastic lesions noted in the visualized portions of the skeleton.  CTA ABDOMEN AND PELVIS FINDINGS  Hepatobiliary: No suspicious appearing cystic or solid hepatic lesions. No intra or extrahepatic biliary ductal dilatation. Gallbladder is unremarkable in appearance.  Pancreas: No pancreatic mass. No pancreatic ductal dilatation. No pancreatic or peripancreatic fluid or inflammatory changes.  Spleen: Unremarkable.  Adrenals/Urinary Tract: Mild bilateral renal atrophy. Bilateral kidneys and bilateral adrenal glands are otherwise normal in appearance. No hydroureteronephrosis. Urinary bladder is normal in appearance.  Stomach/Bowel: Normal appearance of the stomach. No pathologic dilatation of small bowel or colon. There are numerous colonic diverticulaei, most pronounced in the descending colon and sigmoid colon, without surrounding inflammatory changes to suggest an acute diverticulitis at this time.  Vascular/Lymphatic: Vascular findings and measurements pertinent to potential TAVR procedure, as detailed below. No aneurysm or dissection identified in the abdominal or pelvic vasculature. Single right renal artery. Two left renal arteries. Celiac axis, superior mesenteric artery and inferior mesenteric artery are all patent.  Reproductive: Uterus and ovaries are atrophic.  Other: Small bilateral inguinal hernias  (left greater than right) containing only fat. No significant volume of ascites. No pneumoperitoneum.  Musculoskeletal: There are no aggressive appearing lytic or blastic lesions noted in the visualized portions of the skeleton.  VASCULAR MEASUREMENTS PERTINENT TO TAVR:  AORTA:  Minimal Aortic Diameter - 12 x 14 mm  Severity of Aortic Calcification - severe  RIGHT PELVIS:  Right Common Iliac Artery -  Minimal Diameter - 9.5 x 9.4 mm  Tortuosity - mild  Calcification - moderate  Right External Iliac Artery -  Minimal Diameter - 7.2 x 6.0 mm  Tortuosity - moderate  Calcification - none  Right Common Femoral Artery -  Minimal Diameter - 6.8 x 7.4 mm  Tortuosity - mild  Calcification - moderate to severe  LEFT PELVIS:  Left Common Iliac Artery -  Minimal Diameter - 4.9 x 11.3 mm  Tortuosity - moderate  Calcification - moderate  Left External Iliac Artery -  Minimal Diameter - 8.0 x 7.9 mm  Tortuosity - severe  Calcification - minimal  Left Common Femoral Artery -  Minimal  Diameter - 3.7 x 5.4 mm  Tortuosity - mild  Calcification - moderate to severe  Review of the MIP images confirms the above findings.  IMPRESSION: 1. Findings and measurements pertinent to potential TAVR procedure, as detailed above. This patient does appear to have suitable right-sided pelvic arterial access. 2. Thickening calcification of the aortic valve, compatible with the reported clinical history of severe aortic stenosis. 3. Small filling defect in the tip of the left atrial appendage. This is favored to represent pseudothrombus, but close attention at time of future transesophageal echocardiogram is recommended to exclude the possibility of true left atrial appendage thrombus. 4. New ground-glass attenuation nodule with a mean diameter of 10 mm in the superior segment of the left lower lobe. Initial follow-up with CT at 6-12  months is recommended to confirm persistence. If persistent, repeat CT is recommended every 2 years until 5 years of stability has been established. This recommendation follows the consensus statement: Guidelines for Management of Incidental Pulmonary Nodules Detected on CT Images:From the Fleischner Society 2017; published online before print (10.1148/radiol.IJ:2314499). 5. Severe colonic diverticulosis without evidence to suggest an acute diverticulitis at this time. 6. The appearance of the lungs remains compatible with interstitial lung disease. However, given the stability compared to the prior study 11/03/2014, this may simply represent mild fibrotic phase nonspecific interstitial pneumonia (NSIP), as usual interstitial pneumonia (UIP) is typically progressive. 7. Multiple borderline enlarged and minimally enlarged mediastinal and bilateral hilar lymph nodes, likely chronic related to underlying interstitial lung disease. 8. Multinodular goiter redemonstrated. 9. Additional incidental findings, as above.   Electronically Signed  By: Vinnie Langton M.D    STS Risk Calculator  Procedure    AVR  Risk of Mortality   9.7% Morbidity or Mortality  29.0% Prolonged LOS   17.2% Short LOS    9.8% Permanent Stroke   4.1% Prolonged Vent Support  20.9% DSW Infection    0.1% Renal Failure    9.9% Reoperation    9.9%    Impression:  Patient has stage D severe symptomatic aortic stenosis. She presents with progressive symptoms of exertional shortness of breath and fatigue consistent with chronic diastolic congestive heart failure, New York Heart Association functional class III. I have personally reviewed the patient's recent transthoracic echocardiogram, diagnostic cardiac catheterization, and CT angiograms. The patient has critical aortic stenosis. There is severe thickening, calcification, and restricted leaflet mobility involving all 3 leaflets of the patient's aortic valve.  Peak velocity across the aortic valve ranges between 4.7 and 5.2 m/s.  Mean transvalvular gradient was estimated 74 mmHg. The patient also has significant mitral annular calcification and at least moderate (3+) mitral regurgitation. The jet of regurgitation is central and does not completely fill the left atrium.  Left ventricular systolic function appears preserved with ejection fraction estimated at 55-60% although this is in the setting of significant mitral regurgitation. Diagnostic cardiac catheterization is notable for the absence of significant coronary artery disease. Pulmonary artery pressures were only mildly elevated.   Risks associated with conventional surgical aortic valve replacement would be very high because of the patient's extremely advanced age and sedentary lifestyle. She also has significant calcification in the aortic root.  I would not consider her a candidate for conventional surgery.  Options include transcatheter aortic valve replacement versus long-term palliative medical therapy.  Cardiac gated CT angiogram of the heart demonstrates findings consistent with severe aortic stenosis with anatomical circumstances suitable for transcatheter aortic valve replacement without any significant, locating features.  CT angiography  of the aorta and iliac vessels demonstrates significant aortoiliac disease with heavy calcification, but the patient appears to have marginal but adequate pelvic vascular access for a transfemoral approach.   Plan:  The patient and her daughter-in-law were counseled at length regarding treatment alternatives for management of severe symptomatic aortic stenosis. Alternative approaches such as conventional aortic valve replacement, transcatheter aortic valve replacement, and palliative medical therapy were compared and contrasted at length.  The risks associated with conventional surgical aortic valve replacement were been discussed in detail, as were reasons why I  would not consider the patient a candidate for conventional surgery.  Long-term prognosis with medical therapy was discussed. Transcatheter aortic valve replacement was discussed extensively as an alternative. Expectations for the patient's recovery following TAVR were discussed.  This discussion was placed in the context of the patient's own specific clinical presentation and past medical history.  In particular, because the patient lives alone we discussed the likelihood that she would need short-term placement in a skilled nursing facility for rehabilitation at the time of hospital discharge.  All of their questions been addressed.  For family reasons the patient hopes to proceed with transcatheter aortic valve replacement in early May. We tentatively plan to proceed with surgery on 12/11/2015.  She has been instructed to stop taking warfarin 5 days prior to surgery.  I spent in excess of 45 minutes during the conduct of this office consultation and >50% of this time involved direct face-to-face encounter with the patient for counseling and/or coordination of their care.    Valentina Gu. [name], MD 11/05/2015 1:05 PM

## 2015-11-05 NOTE — Patient Instructions (Signed)
Continue all previous medications without any changes at this time  Stop taking Coumadin (warfarin) 5 days prior to your surgery.

## 2015-11-06 ENCOUNTER — Encounter: Payer: Medicare Other | Admitting: Thoracic Surgery (Cardiothoracic Vascular Surgery)

## 2015-11-11 ENCOUNTER — Other Ambulatory Visit: Payer: Self-pay | Admitting: Cardiology

## 2015-11-12 ENCOUNTER — Ambulatory Visit (INDEPENDENT_AMBULATORY_CARE_PROVIDER_SITE_OTHER): Payer: Medicare Other | Admitting: Pharmacist

## 2015-11-12 DIAGNOSIS — I4891 Unspecified atrial fibrillation: Secondary | ICD-10-CM | POA: Diagnosis not present

## 2015-11-12 DIAGNOSIS — Z5181 Encounter for therapeutic drug level monitoring: Secondary | ICD-10-CM

## 2015-11-12 LAB — POCT INR: INR: 2.5

## 2015-11-12 NOTE — Telephone Encounter (Signed)
Called spoke with pt advised she missed last appt on 11/02/15, pt states she is not driving at present, so she will have to try to arrange transportation.  Pt will call us back to schedule f/u appt.  Advised pt we cannot refill Coumadin rx without first checking her INR.  Pt states she will call us back to schedule appt.

## 2015-11-22 ENCOUNTER — Telehealth: Payer: Self-pay | Admitting: Cardiovascular Disease

## 2015-11-22 NOTE — Telephone Encounter (Signed)
Making sure this is routed properly

## 2015-11-22 NOTE — Telephone Encounter (Signed)
New Message  When the patient woke up she didn't feel right. Chest just didn't feel right. Vitals are good but she has no energy. Please call back for a same day appt.  No pain or chest tightness. It just doesn't feel right. The pt is kind of pale. Has taken no medications because she cannot get herself motivated. Please call back to discuss.   Per Dyane Dustman Retired Therapist, sports is with the patient. Request to give vitals and they are: BP 165/70 Pulse 68 and slightly irregular. Respirations are 16 and shallow.

## 2015-11-22 NOTE — Telephone Encounter (Signed)
Pt states she woke up this morning just didn't feel right. Pt denies any change in symptoms, pt denies any new symptoms. Pt states she slept well last night. Pt states she ate a little this morning but denies nausea or change in appetite. Pt states she does have a history of high glucose but does not take medication for diabetes. Pt states she will check her glucose now if she has a glucose monitor at home.  Previous note states BP 165/70, pt states she had not taken her morning medication when BP checked earlier. Pt states she has taken her morning medication but has not rechecked her BP or pulse.   I reviewed with Dr Harrington Challenger- Per Dr Harrington Challenger, continue to monitor symptoms, recheck BP and pulse in about 1 hour and call our office with the readings. Pt advised to stay well hydrated. Pt states she will recheck BP and pulse in an hour and call back with the readings and let us know how she is feeling.

## 2015-11-22 NOTE — Telephone Encounter (Signed)
I called pt to see how she was feeling.   Pt states she is feeling back to normal. Pt states her BP was rechecked and was okay but she did not know the reading.   Pt advised to call if symptoms change.

## 2015-11-23 ENCOUNTER — Encounter: Payer: Medicare Other | Admitting: Surgery

## 2015-11-26 ENCOUNTER — Encounter (HOSPITAL_COMMUNITY): Payer: Self-pay

## 2015-11-26 ENCOUNTER — Encounter (HOSPITAL_COMMUNITY)
Admission: RE | Admit: 2015-11-26 | Discharge: 2015-11-26 | Disposition: A | Discharge status: Home / Self Care | Payer: Medicare Other | Source: Ambulatory Visit | Attending: Cardiovascular Disease | Admitting: Cardiovascular Disease

## 2015-11-26 ENCOUNTER — Encounter: Payer: Self-pay | Admitting: Surgery

## 2015-11-26 ENCOUNTER — Ambulatory Visit (HOSPITAL_COMMUNITY)
Admission: RE | Admit: 2015-11-26 | Discharge: 2015-11-26 | Disposition: A | Discharge status: Home / Self Care | Payer: Medicare Other | Source: Ambulatory Visit | Attending: Cardiovascular Disease | Admitting: Cardiovascular Disease

## 2015-11-26 ENCOUNTER — Institutional Professional Consult (permissible substitution) (INDEPENDENT_AMBULATORY_CARE_PROVIDER_SITE_OTHER): Payer: Medicare Other | Admitting: Surgery

## 2015-11-26 VITALS — BP 160/80 | HR 80 | Resp 20 | Ht 60.0 in | Wt 136.0 lb

## 2015-11-26 VITALS — BP 148/68 | HR 66 | Temp 97.9°F | Resp 18 | Ht 60.0 in | Wt 136.4 lb

## 2015-11-26 DIAGNOSIS — N183 Chronic kidney disease, stage 3 (moderate): Secondary | ICD-10-CM | POA: Insufficient documentation

## 2015-11-26 DIAGNOSIS — I5033 Acute on chronic diastolic (congestive) heart failure: Secondary | ICD-10-CM | POA: Diagnosis not present

## 2015-11-26 DIAGNOSIS — I447 Left bundle-branch block, unspecified: Secondary | ICD-10-CM | POA: Insufficient documentation

## 2015-11-26 DIAGNOSIS — I350 Nonrheumatic aortic (valve) stenosis: Secondary | ICD-10-CM

## 2015-11-26 DIAGNOSIS — K219 Gastro-esophageal reflux disease without esophagitis: Secondary | ICD-10-CM

## 2015-11-26 DIAGNOSIS — R9431 Abnormal electrocardiogram [ECG] [EKG]: Secondary | ICD-10-CM

## 2015-11-26 DIAGNOSIS — I517 Cardiomegaly: Secondary | ICD-10-CM | POA: Insufficient documentation

## 2015-11-26 DIAGNOSIS — Z79899 Other long term (current) drug therapy: Secondary | ICD-10-CM | POA: Insufficient documentation

## 2015-11-26 DIAGNOSIS — I480 Paroxysmal atrial fibrillation: Secondary | ICD-10-CM

## 2015-11-26 DIAGNOSIS — Z01818 Encounter for other preprocedural examination: Secondary | ICD-10-CM | POA: Diagnosis not present

## 2015-11-26 DIAGNOSIS — Z7901 Long term (current) use of anticoagulants: Secondary | ICD-10-CM | POA: Diagnosis not present

## 2015-11-26 DIAGNOSIS — I129 Hypertensive chronic kidney disease with stage 1 through stage 4 chronic kidney disease, or unspecified chronic kidney disease: Secondary | ICD-10-CM

## 2015-11-26 DIAGNOSIS — Z01812 Encounter for preprocedural laboratory examination: Secondary | ICD-10-CM

## 2015-11-26 DIAGNOSIS — J849 Interstitial pulmonary disease, unspecified: Secondary | ICD-10-CM

## 2015-11-26 DIAGNOSIS — I083 Combined rheumatic disorders of mitral, aortic and tricuspid valves: Secondary | ICD-10-CM | POA: Diagnosis not present

## 2015-11-26 DIAGNOSIS — I4892 Unspecified atrial flutter: Secondary | ICD-10-CM | POA: Insufficient documentation

## 2015-11-26 DIAGNOSIS — Z01810 Encounter for preprocedural cardiovascular examination: Secondary | ICD-10-CM

## 2015-11-26 DIAGNOSIS — E1122 Type 2 diabetes mellitus with diabetic chronic kidney disease: Secondary | ICD-10-CM | POA: Diagnosis not present

## 2015-11-26 DIAGNOSIS — I272 Other secondary pulmonary hypertension: Secondary | ICD-10-CM | POA: Diagnosis not present

## 2015-11-26 DIAGNOSIS — Z5181 Encounter for therapeutic drug level monitoring: Secondary | ICD-10-CM

## 2015-11-26 LAB — COMPREHENSIVE METABOLIC PANEL
ALT: 14 U/L (ref 14–54)
AST: 16 U/L (ref 15–41)
Albumin: 3.5 g/dL (ref 3.5–5.0)
Alkaline Phosphatase: 68 U/L (ref 38–126)
Anion gap: 13 (ref 5–15)
BUN: 21 mg/dL — ABNORMAL HIGH (ref 6–20)
CO2: 21 mmol/L — ABNORMAL LOW (ref 22–32)
Calcium: 9 mg/dL (ref 8.9–10.3)
Chloride: 106 mmol/L (ref 101–111)
Creatinine, Ser: 1.18 mg/dL — ABNORMAL HIGH (ref 0.44–1.00)
GFR calc Af Amer: 45 mL/min — ABNORMAL LOW (ref >60)
GFR calc non Af Amer: 39 mL/min — ABNORMAL LOW (ref >60)
Glucose, Bld: 133 mg/dL — ABNORMAL HIGH (ref 65–99)
Potassium: 4.2 mmol/L (ref 3.5–5.1)
Sodium: 140 mmol/L (ref 135–145)
Total Bilirubin: 0.9 mg/dL (ref 0.3–1.2)
Total Protein: 6.6 g/dL (ref 6.5–8.1)

## 2015-11-26 LAB — BLOOD GAS, ARTERIAL
Acid-base deficit: 1.2 mmol/L (ref 0.0–2.0)
Bicarbonate: 22.2 mEq/L (ref 20.0–24.0)
Drawn by: 421801
FIO2: 0.21
O2 Saturation: 97.7 %
Patient temperature: 98.6
TCO2: 23.2 mmol/L (ref 0–100)
pCO2 arterial: 32.4 mmHg — ABNORMAL LOW (ref 35.0–45.0)
pH, Arterial: 7.452 — ABNORMAL HIGH (ref 7.350–7.450)
pO2, Arterial: 94.9 mmHg (ref 80.0–100.0)

## 2015-11-26 LAB — PROTIME-INR
INR: 1.39 (ref 0.00–1.49)
Prothrombin Time: 17.2 seconds — ABNORMAL HIGH (ref 11.6–15.2)

## 2015-11-26 LAB — URINE MICROSCOPIC-ADD ON

## 2015-11-26 LAB — CBC
HCT: 37.1 % (ref 36.0–46.0)
Hemoglobin: 12.1 g/dL (ref 12.0–15.0)
MCH: 29.1 pg (ref 26.0–34.0)
MCHC: 32.6 g/dL (ref 30.0–36.0)
MCV: 89.2 fL (ref 78.0–100.0)
Platelets: 236 10*3/uL (ref 150–400)
RBC: 4.16 MIL/uL (ref 3.87–5.11)
RDW: 14.1 % (ref 11.5–15.5)
WBC: 8.3 10*3/uL (ref 4.0–10.5)

## 2015-11-26 LAB — URINALYSIS, ROUTINE W REFLEX MICROSCOPIC
Bilirubin Urine: NEGATIVE
Glucose, UA: NEGATIVE mg/dL
Ketones, ur: NEGATIVE mg/dL
Nitrite: NEGATIVE
Protein, ur: 30 mg/dL — AB
Specific Gravity, Urine: 1.016 (ref 1.005–1.030)
pH: 6 (ref 5.0–8.0)

## 2015-11-26 LAB — SURGICAL PCR SCREEN
MRSA, PCR: NEGATIVE
Staphylococcus aureus: NEGATIVE

## 2015-11-26 LAB — GLUCOSE, CAPILLARY: Glucose-Capillary: 144 mg/dL — ABNORMAL HIGH (ref 65–99)

## 2015-11-26 LAB — APTT: aPTT: 32 seconds (ref 24–37)

## 2015-11-26 LAB — ABO/RH: ABO/RH(D): O POS

## 2015-11-26 IMAGING — CR DG CHEST 2V
2 series · 2 of 2 positions shown · non-contrast
Comparison: [DATE]

CLINICAL DATA: Preop for TAVR.

EXAM:
CHEST  2 VIEW

[w chest pa]
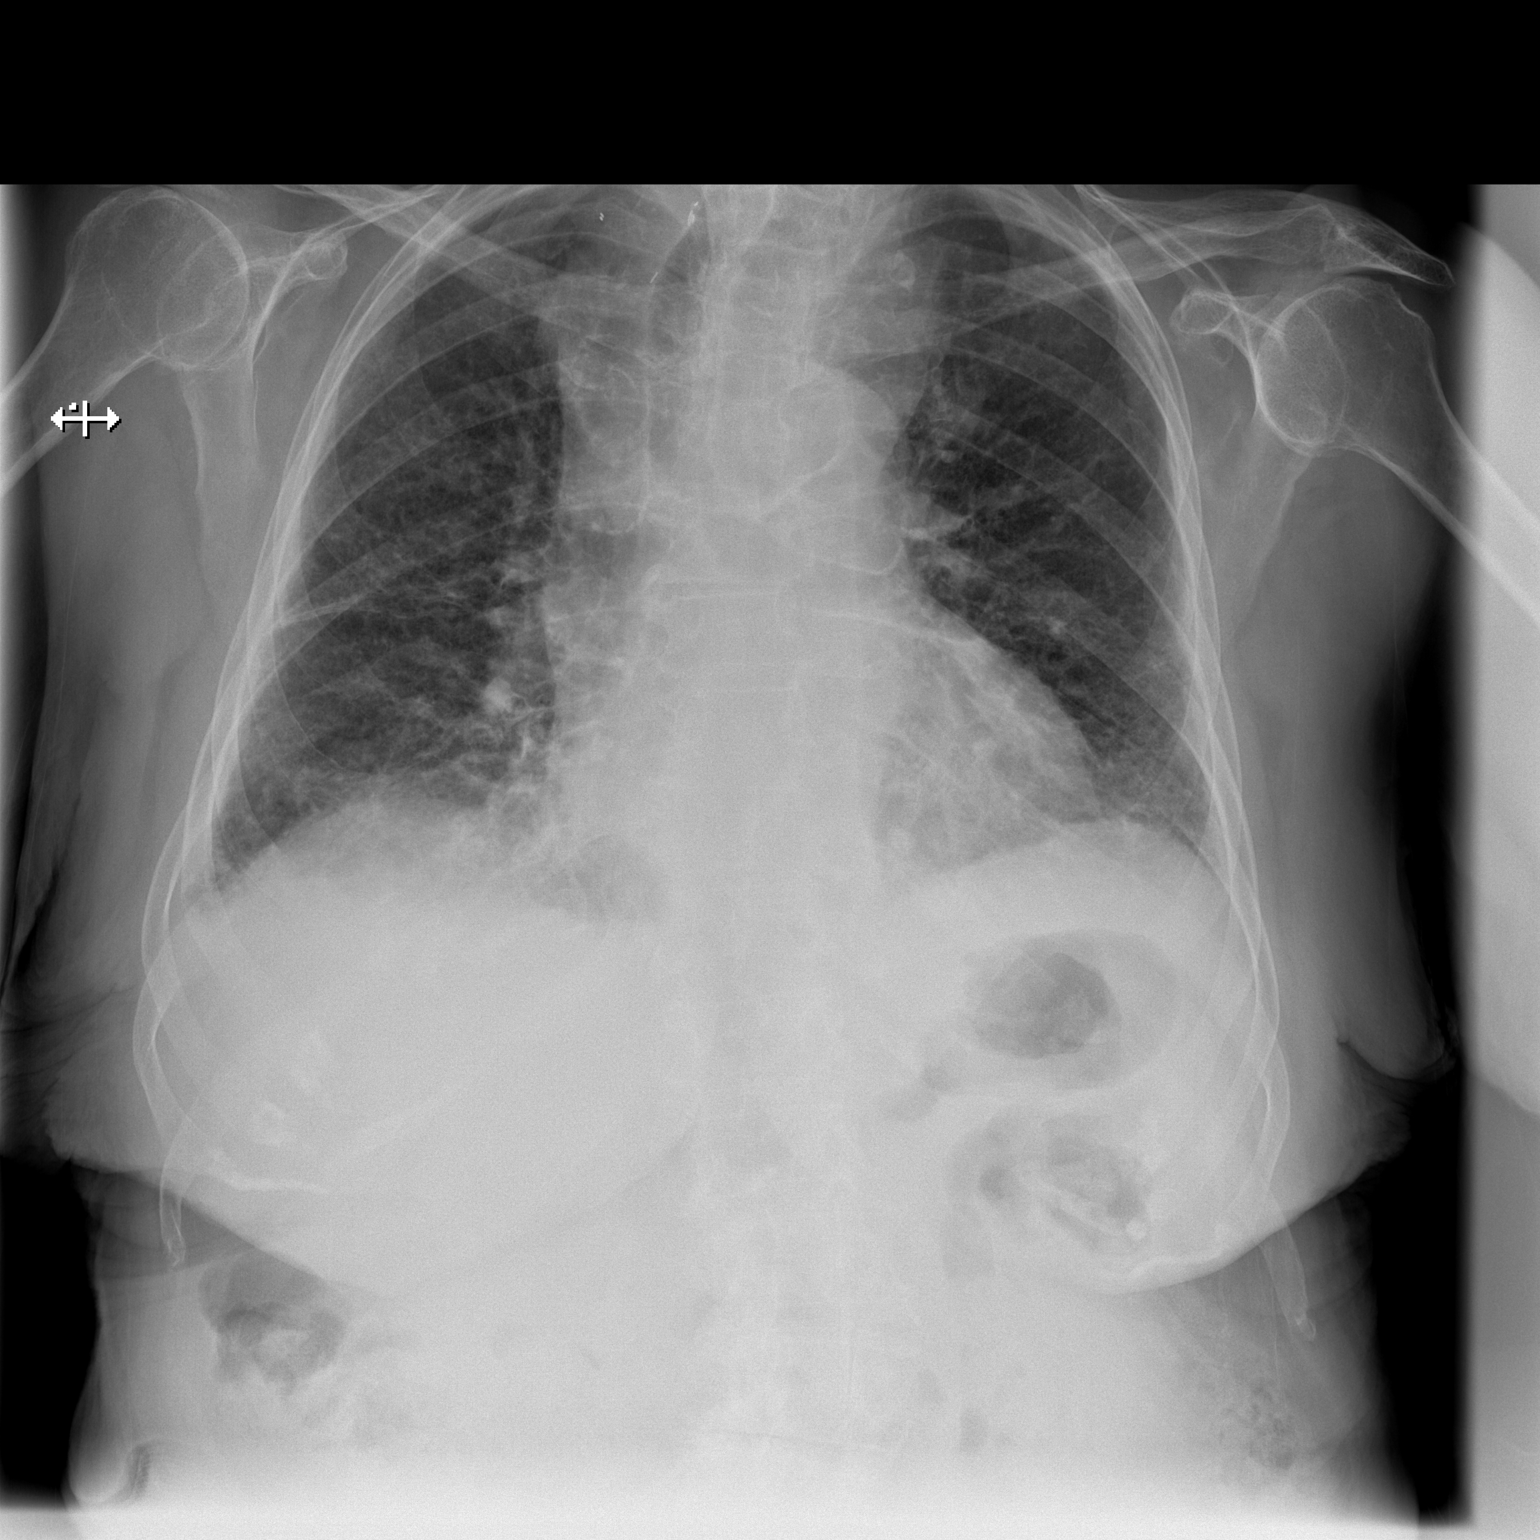

[w chest lat]
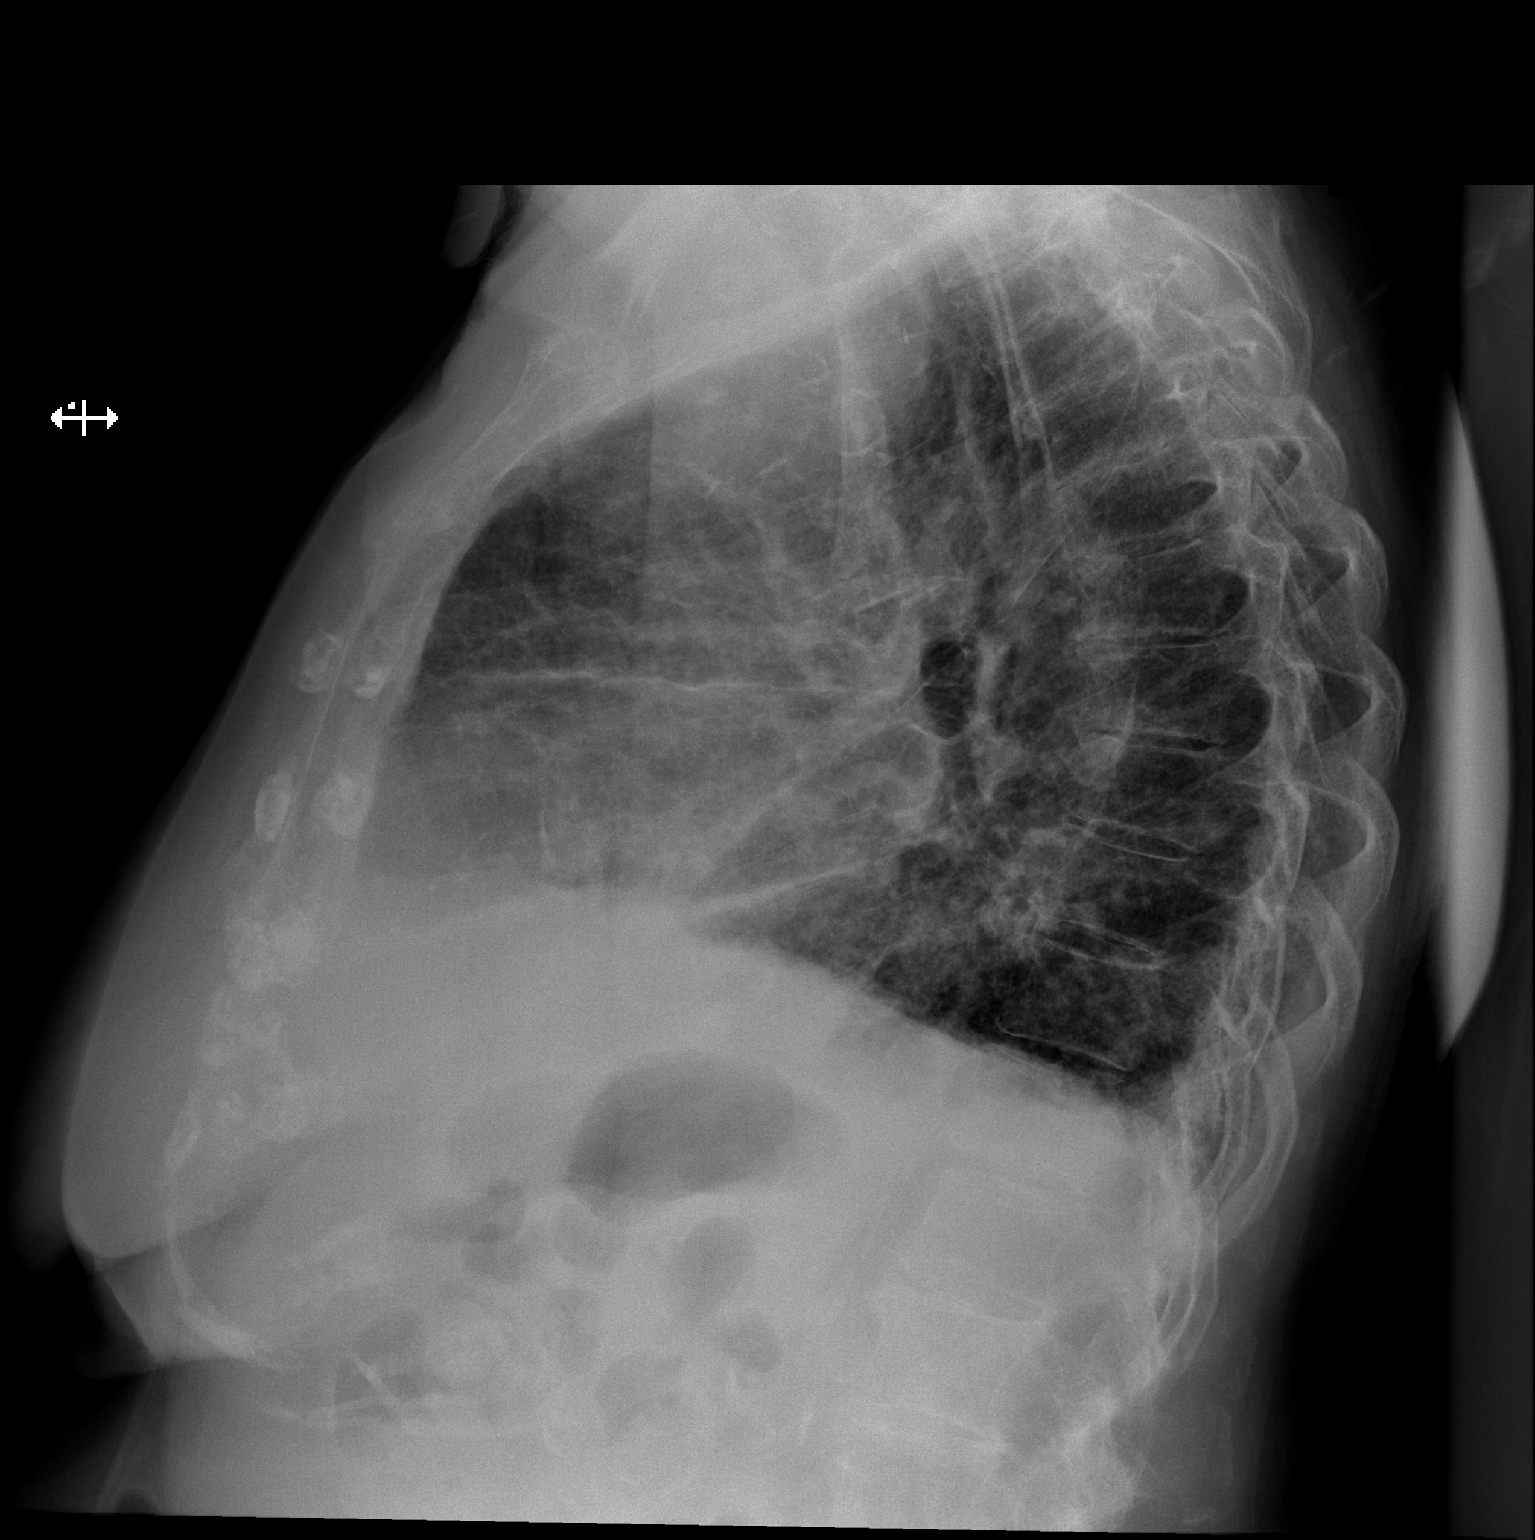

[2 of 2 positions shown; findings below may reference images not displayed]

FINDINGS: Moderate thoracic spondylosis. Patient rotated right on the frontal
radiograph. Cardiomegaly. Tracheal deviation to the right secondary
to left-sided thyroid enlargement. No pleural effusion or
pneumothorax. Lower lobe predominant interstitial thickening. No
lobar consolidation. No overt congestive failure. Mild volume loss
in both lung bases.
IMPRESSION: Cardiomegaly with chronic interstitial lung disease, as on prior CT.
No convincing evidence of acute superimposed process.

## 2015-11-26 MED ORDER — SODIUM CHLORIDE 0.9 % IV SOLN
1250.0000 mg | INTRAVENOUS | Status: AC
  Administered 2015-11-27: 1250 mg via INTRAVENOUS
  Filled 2015-11-26: qty 1250

## 2015-11-26 MED ORDER — INSULIN REGULAR HUMAN 100 UNIT/ML IJ SOLN
INTRAMUSCULAR | Status: DC
  Filled 2015-11-26: qty 2.5

## 2015-11-26 MED ORDER — SODIUM CHLORIDE 0.9 % IV SOLN: INTRAVENOUS | Status: DC

## 2015-11-26 MED ORDER — MAGNESIUM SULFATE 50 % IJ SOLN
40.0000 meq | INTRAMUSCULAR | Status: DC
  Filled 2015-11-26: qty 10

## 2015-11-26 MED ORDER — DEXTROSE 5 % IV SOLN
1.5000 g | INTRAVENOUS | Status: AC
  Administered 2015-11-27: 1.5 g via INTRAVENOUS
  Filled 2015-11-26: qty 1.5

## 2015-11-26 MED ORDER — SODIUM CHLORIDE 0.9 % IV SOLN
INTRAVENOUS | Status: DC
  Filled 2015-11-26: qty 30

## 2015-11-26 MED ORDER — PHENYLEPHRINE HCL 10 MG/ML IJ SOLN
30.0000 ug/min | INTRAVENOUS | Status: DC
  Filled 2015-11-26: qty 2

## 2015-11-26 MED ORDER — DEXTROSE 5 % IV SOLN
0.0000 ug/min | INTRAVENOUS | Status: DC
  Filled 2015-11-26: qty 4

## 2015-11-26 MED ORDER — DEXMEDETOMIDINE HCL IN NACL 400 MCG/100ML IV SOLN
0.1000 ug/kg/h | INTRAVENOUS | Status: DC
  Filled 2015-11-26: qty 100

## 2015-11-26 MED ORDER — POTASSIUM CHLORIDE 2 MEQ/ML IV SOLN
80.0000 meq | INTRAVENOUS | Status: DC
  Filled 2015-11-26: qty 40

## 2015-11-26 MED ORDER — NITROGLYCERIN IN D5W 200-5 MCG/ML-% IV SOLN
2.0000 ug/min | INTRAVENOUS | Status: DC
  Filled 2015-11-26: qty 250

## 2015-11-26 MED ORDER — DOPAMINE-DEXTROSE 3.2-5 MG/ML-% IV SOLN
0.0000 ug/kg/min | INTRAVENOUS | Status: DC
  Filled 2015-11-26: qty 250

## 2015-11-26 MED ORDER — NOREPINEPHRINE BITARTRATE 1 MG/ML IV SOLN
0.0000 ug/min | INTRAVENOUS | Status: DC
  Filled 2015-11-26: qty 4

## 2015-11-26 NOTE — Pre-Procedure Instructions (Signed)
   10:00  Alexa Mcdonald  11/26/2015     Your procedure is scheduled on  Tuesday, April 25  Report to [facility] Admitting at 8:00 A.M.               Your surgery or procedure is scheduled for 10:00 AM   Call this number if you have problems the morning of surgery: [phone]   Remember:  Do not eat food or drink liquids after midnight.  Take these medicines the morning of surgery with A SIP OF WATER : methimazole (TAPAZOLE).               May take acetaminophen (TYLENOL) if needed.                  No  Coumadin,Aspirin. Aspirin Products, Ibuprofen, Naproxen, Herbal medications or Vitamins   Do not wear jewelry, make-up or nail polish.  Do not wear lotions, powders, or perfumes.  Do not shave 48 hours prior to surgery.   Do not bring valuables to the hospital.  [facility] is not responsible for any belongings or valuables.  Contacts, dentures or bridgework may not be worn into surgery.  Leave your suitcase in the car.  After surgery it may be brought to your room.  For patients admitted to the hospital, discharge time will be determined by your treatment team.  Special instructions:  Review  [facility] - Preparing For Surgery.  Please read over the following fact sheets that you were given. Pain Booklet, Coughing and Deep Breathing, Blood Transfusion Information, MRSA Information and Surgical Site Infection Prevention and Incentive Spirometer

## 2015-11-26 NOTE — Anesthesia Preprocedure Evaluation (Addendum)
Anesthesia Evaluation  Patient identified by MRN, date of birth, ID band Patient awake    Reviewed: Allergy & Precautions, NPO status , Patient's Chart, lab work & pertinent test results  History of Anesthesia Complications Negative for: history of anesthetic complications  Airway Mallampati: II  TM Distance: <3 FB Neck ROM: Full    Dental  (+) Teeth Intact   Pulmonary neg pulmonary ROS, shortness of breath,  Large intrathoracic goiter oted on CXR, c slight deviation of trachea to R   breath sounds clear to auscultation       Cardiovascular hypertension, + dysrhythmias + Valvular Problems/Murmurs AS and MR  Rhythm:Regular Rate:Normal + Systolic murmurs  CYRENE STRICK ECHO COMPLETE WO IMAGE ENHANCING AGENT Order# [id number]   Reading physician: Larey Dresser, MD Ordering physician: [name], MD Study date: 10/17/15   Result Notes   Notes Recorded by Barkley Boards, RN on 10/19/2015 at 2:39 PM Pt aware of results by phone.  (15 minute phone call) I also contacted the pt's son [name] [phone]) and spoke with him about echo results and reviewed TAVR information. He will speak with the pt further and make sure that the pt wants to proceed with TAVR. I advised him to contact the office if the pt decides that she does not want to pursue TAVR.   ------  Notes Recorded by [name], MD on 10/17/2015 at 11:07 PM Study reviewed. Plan to continue TAVR workup. Pt with severe MR which will hopefully improve with treatment of her critical aortic valve disease.  Study Result   Result status: Final result    [facility]  [address], [place] [address]  [phone]  ------------------------------------------------------------------- Transthoracic Echocardiography  Patient: Alexa Mcdonald, Alexa Mcdonald MR #: [id number] Study Date: 10/17/2015 Gender: F Age: 82 Height: 153.7 cm Weight: 60.1 kg BSA: 1.62 m^2 Pt. Status: Room:  Meribeth Mattes, MD [place] [place], M.D. SONOGRAPHER Cindy Hazy, RDCS PERFORMING [facility], Outpatient  cc:  ------------------------------------------------------------------- LV EF: 55% - 60%  ------------------------------------------------------------------- Indications: I35.9 Aortic Valve Disorder.  ------------------------------------------------------------------- History: PMH: Acquired from the patient and from the patient&'s chart. PMH: Paroxysmal Atrial Fibrillation. Chronic Kidney disease. LBBB. Tachy-Brady Syndrome. Risk factors: Diabetes mellitus.  ------------------------------------------------------------------- Study Conclusions  - Left ventricle: The cavity size was normal. Wall thickness was  increased in a pattern of mild LVH. Systolic function was normal.  The estimated ejection fraction was in the range of 55% to 60%.  Septal-lateral dyssynchrony. Wall motion was normal; there were  no regional wall motion abnormalities. Features are consistent  with a pseudonormal left ventricular filling pattern, with  concomitant abnormal relaxation and increased filling pressure  (grade 2 diastolic dysfunction). E/medial e&' > 15 suggests LV end  diastolic pressure at least 20 mmHg. - Aortic valve: Probably trileaflet; severely calcified leaflets.  There was severe stenosis. There was moderate regurgitation. Mean  gradient (S): 74 mm Hg. Valve area (VTI): 0.51 cm^2. - Mitral valve: Moderately calcified annulus. Mildly calcified  leaflets . There was moderate to severe regurgitation. - Left atrium: The atrium was severely dilated. - Right ventricle: The cavity size was normal. Systolic function  was  normal. - Tricuspid valve: Peak RV-RA gradient (S): 62 mm Hg. - Pulmonary arteries: PA peak pressure: 65 mm Hg (S). - Inferior vena cava: The vessel was normal in size. The  respirophasic diameter changes were in the normal range (>= 50%),  consistent with normal central venous  pressure.  Impressions:  - Normal LV size with mild LV hypertrophy. EF 55-60% with  septal-lateral dyssynchrony. Normal RV size and systolic  function. Severe aortic stenosis, moderate aortic insufficiency,  moderate to severe mitral regurgitation. Moderate pulmonary  hypertension.  Transthoracic echocardiography. M-mode, complete 2D, spectral Doppler, and color Doppler. Birthdate: Patient birthdate: 11-22-1920. Age: Patient is 82 yr old. Sex: Gender: female. BMI: 25.5 kg/m^2. Blood pressure: 134/70 Patient status: Outpatient. Study date: Study date: 10/17/2015. Study time: 01:32 PM. Location: [facility]  -------------------------------------------------------------------  ------------------------------------------------------------------- Left ventricle: The cavity size was normal. Wall thickness was increased in a pattern of mild LVH. Systolic function was normal. The estimated ejection fraction was in the range of 55% to 60%. Septal-lateral dyssynchrony. Wall motion was normal; there were no regional wall motion abnormalities. Features are consistent with a pseudonormal left ventricular filling pattern, with concomitant abnormal relaxation and increased filling pressure (grade 2 diastolic dysfunction). E/medial e&' > 15 suggests LV end diastolic pressure at least 20 mmHg.  ------------------------------------------------------------------- Aortic valve: Probably trileaflet; severely calcified leaflets. Doppler: There was severe stenosis. There was moderate regurgitation. VTI ratio of LVOT to aortic valve: 0.18. Valve area (VTI): 0.51 cm^2. Indexed valve  area (VTI): 0.31 cm^2/m^2. Peak velocity ratio of LVOT to aortic valve: 0.19. Valve area (Vmax): 0.54 cm^2. Indexed valve area (Vmax): 0.33 cm^2/m^2. Mean velocity ratio of LVOT to aortic valve: 0.19. Valve area (Vmean): 0.55 cm^2. Indexed valve area (Vmean): 0.34 cm^2/m^2. Mean gradient (S): 74 mm Hg. Peak gradient (S): 110 mm Hg.  ------------------------------------------------------------------- Aorta: Aortic root: The aortic root was normal in size. Ascending aorta: The ascending aorta was normal in size.  ------------------------------------------------------------------- Mitral valve: Moderately calcified annulus. Mildly calcified leaflets . Doppler: There was no evidence for stenosis. There was moderate to severe regurgitation. Peak gradient (D): 13 mm Hg.  ------------------------------------------------------------------- Left atrium: The atrium was severely dilated.  ------------------------------------------------------------------- Right ventricle: The cavity size was normal. Systolic function was normal.  ------------------------------------------------------------------- Pulmonic valve: Structurally normal valve. Cusp separation was normal. Doppler: Transvalvular velocity was within the normal range. There was no regurgitation.  ------------------------------------------------------------------- Tricuspid valve: Doppler: There was mild regurgitation.  ------------------------------------------------------------------- Right atrium: The atrium was normal in size.  ------------------------------------------------------------------- Pericardium: There was no pericardial effusion.  ------------------------------------------------------------------- Systemic veins: Inferior vena cava: The vessel was normal in size. The respirophasic diameter changes were in the normal range (>= 50%), consistent with normal central venous  pressure.  ------------------------------------------------------------------- Measurements  Left ventricle Value Reference LV ID, ED, PLAX chordal (L) 38.1 mm 43 - 52 LV ID, ES, PLAX chordal 28.4 mm 23 - 38 LV fx shortening, PLAX chordal (L) 25 % >=29 LV PW thickness, ED 11.9 mm --------- IVS/LV PW ratio, ED 1.14 <=1.3 Stroke volume, 2D 74 ml --------- Stroke volume/bsa, 2D 46 ml/m^2 --------- LV e&', lateral 7.68 cm/s --------- LV E/e&', lateral 23.31 --------- LV e&', medial 5.37 cm/s --------- LV E/e&', medial 33.33 --------- LV e&', average 6.53 cm/s --------- LV E/e&', average 27.43 ---------  Ventricular septum Value Reference IVS thickness, ED 13.6 mm ---------  LVOT Value Reference LVOT ID, S 19 mm --------- LVOT area 2.84 cm^2 --------- LVOT ID 19 mm --------- LVOT peak velocity, S 99.2 cm/s --------- LVOT mean velocity, S 78.7 cm/s --------- LVOT VTI, S 26.2 cm --------- LVOT peak gradient, S 4 mm Hg --------- Stroke volume (SV), LVOT DP 74.3 ml --------- Stroke index (SV/bsa), LVOT DP 45.9 ml/m^2 ---------  Aortic valve  Value Reference Aortic valve peak velocity, S 524  cm/s --------- Aortic valve mean velocity, S 407 cm/s --------- Aortic valve VTI, S 147 cm --------- Aortic mean gradient, S 74 mm Hg --------- Aortic peak gradient, S 110 mm Hg --------- VTI ratio, LVOT/AV 0.18 --------- Aortic valve area, VTI 0.51 cm^2 --------- Aortic valve area/bsa, VTI 0.31 cm^2/m^2 --------- Velocity ratio, peak, LVOT/AV 0.19 --------- Aortic valve area, peak velocity 0.54 cm^2 --------- Aortic valve area/bsa, peak 0.33 cm^2/m^2 --------- velocity Velocity ratio, mean, LVOT/AV 0.19 --------- Aortic valve area, mean velocity 0.55 cm^2 --------- Aortic valve area/bsa, mean 0.34 cm^2/m^2 --------- velocity Aortic regurg pressure half-time 391 ms ---------  Aorta Value Reference Aortic root ID, ED 30 mm ---------  Left atrium Value Reference LA ID, A-P, ES 44 mm --------- LA ID/bsa, A-P (H) 2.72 cm/m^2 <=2.2 LA volume, S 126 ml --------- LA volume/bsa, S 77.9 ml/m^2 --------- LA volume, ES, 1-p A4C 99 ml --------- LA volume/bsa, ES, 1-p A4C 61.2 ml/m^2 --------- LA volume, ES, 1-p A2C 146 ml --------- LA volume/bsa, ES, 1-p A2C 90.3 ml/m^2 ---------  Mitral valve Value Reference Mitral E-wave peak  velocity 179 cm/s --------- Mitral A-wave peak velocity 132 cm/s --------- Mitral deceleration time 183 ms 150 - 230 Mitral peak gradient, D 13 mm Hg --------- Mitral E/A ratio, peak 1.4 --------- Mitral regurg VTI, PISA 259 cm ---------  Pulmonary arteries Value Reference PA pressure, S, DP (H) 65 mm Hg <=30  Tricuspid valve Value Reference Tricuspid peak RV-RA gradient 61 mm Hg ---------  Right ventricle Value Reference RV s&', lateral, S 10.9 cm/s ---------  Legend: (L) and (H) mark values outside specified reference range.  ------------------------------------------------------------------- Prepared and Electronically Authenticated by  Loralie Champagne, M.D. 2017-03-15T22:39:46  PACS Images   Show images for Echocardiogram  Patient Information   Patient Name Sex DOB SSN  Alexa Mcdonald, Alexa Mcdonald Female [birth year] [id number]  Reason For Exam  Priority: Routine  Aortic Stenosis 424.1 / 135.0  Dx: Aortic stenosis (I35.0 (ICD-10-CM))  Procedures Performed   Code Procedure Name  [id number] ECHO COMPLETE WO IMAGING ENHANCING AGENT  Performing Technologist/Nurse   Performing Technologist/Nurse: Wilford Sports Rodgers-Jones   Order-Level Documents:   There are no order-level documents.  Encounter-Level Documents - 10/17/2015:    Electronic signature on 10/17/2015 1:04 PM   Electronic signature on 10/17/2015 1:04 PM  Signed   Electronically signed by Larey Dresser, MD on 10/17/15 at 103 EDT  Printable Result Report   Result Report  Echocardiogram (Order [id number]) Echocardiography Order: [id number]   Released By:  Ardelle Anton Authorizing: [name], MD   Date: 10/17/2015 Department: [facility]    Procedure Abnormality Status   Echocardiogram     Result Notes    Notes Recorded by Barkley Boards, RN on 10/19/2015 at 2:39 PM Pt aware of results by phone.  (15 minute phone call) I also contacted the pt's son Karran Felipe [phone]) and spoke with him about echo results and reviewed TAVR information. He will speak with the pt further and make sure that the pt wants to proceed with TAVR. I advised him to contact the office if the pt decides that she does not want to pursue TAVR.   ------  Notes Recorded by [name], MD on 10/17/2015 at 11:07 PM Study reviewed. Plan to continue TAVR workup. Pt with severe MR which will hopefully improve with treatment of her critical aortic valve disease.   Order Information    Order Date/Time Release Date/Time Start Date/Time End Date/Time   10/15/15 12:32 PM  10/17/15 01:05 PM 10/17/15 01:05 PM None    Order Details    Frequency Duration Priority Order Class   None None Routine Ancillary Performed    Acc#   [id number]    Order Questions    Question Answer Comment   Where should this test be performed [facility])    Complete or Limited study? Complete    With Image Enhancing Agent or without Image Enhancing Agent? With Image Enhancing Agent    Note:  Select Without Image Enhancing Agent only if contraindicated.   Expected Date: 1 week    Reason for exam-Echo Aortic Stenosis 424.1 / 135.0     Associated Diagnoses      ICD-9-CM ICD-10-CM    Aortic stenosis   424.1 I35.0    Authorizing Provider Audit Trail    Date/Time Authorizing Provider Changed by   10/17/2015 1:05 PM [name], MD Barkley Boards, RN    Order Requisition    ECHO COMPLETE [phone]) on 10/17/15   Collection Information      Collected: 10/17/2015 1:32 PM   Resulting Agency: [facility]    View SmartLink Info    ECHO COMPLETE (Order [id number]) on 10/17/15      Neuro/Psych negative neurological ROS     GI/Hepatic GERD  ,  Endo/Other  diabetes  Renal/GU Renal InsufficiencyRenal disease     Musculoskeletal   Abdominal   Peds  Hematology   Anesthesia Other Findings   Reproductive/Obstetrics                           Anesthesia Physical Anesthesia Plan  ASA: IV  Anesthesia Plan: General   Post-op Pain Management:    Induction: Intravenous  Airway Management Planned: Oral ETT  Additional Equipment: Arterial line, PA Cath and TEE  Intra-op Plan:   Post-operative Plan: Extubation in OR  Informed Consent: I have reviewed the patients History and Physical, chart, labs and discussed the procedure including the risks, benefits and alternatives for the proposed anesthesia with the patient or authorized representative who has indicated his/her understanding and acceptance.   Dental advisory given  Plan Discussed with: CRNA and Surgeon  Anesthesia Plan Comments:        Anesthesia Quick Evaluation

## 2015-11-26 NOTE — Progress Notes (Signed)
Patient ID: Alexa Mcdonald, female   DOB: 02-28-1921, 82 y.o.   MRN: [id number]  [place] [facility]ERY CONSULTATION REPORT  Referring Provider is [name], MD PCP is [name], MD  Chief Complaint  Patient presents with  . Aortic Stenosis    2nd TAVR eval, Surgery sch'ed for 11/27/2015    HPI:  The patient is an 82 year old woman with a history of hypertension, PAF on coumadin, stage 3 chronic kidney disease and severe aortic stenosis with chronic diastolic heart failure who has been followed by Dr. [name] for several years. Echocardiograms have shown the gradual progression of severity of her aortic stenosis. She was referred for surgical consultation in July 80for severe aortic stenosis but claimed to be relatively asymptomatic at that time and refused further workup. Over the last several months she has developed progressive symptoms of exertional shortness of breath, dizziness, and fatigue. She now gets short of breath with moderate to low-level activities like walking in her house. She has not had any resting shortness of breath, PND, orthopnea, or lower extremity edema. She has never had any chest pain or chest tightness. A repeat echocardiogram reveals severe aortic stenosis with peak velocity across the aortic valve measuring greater than 5 m/s. Mean transvalvular gradient is  74 mmHg. Left ventricular systolic function remains normal with an ejection fraction of 55-60%. The patient does have significant mitral annular calcification and at moderate to severe mitral regurgitation. Cardiac cath showed mild non-obstructive coronary artery disease and severe aortic stenosis with an aortic valve area calculated at  0.8 cm. Pulmonary artery pressures were mildly elevated at 42/20 with mean pulmonary artery wedge pressure of 13.  She is widowed and lives independently in her own home. Her son is with her  today. She is still fairly active for her age but has been limited by shortness of breath and fatigue with low level activity. It seems like her symptoms are progressing to the point where she can't do much without giving out.   Past Medical History  Diagnosis Date  . Mediastinal goiter      recurrent s/p resection 1988  . CKD (chronic kidney disease), stage III   . Osteopenia   . Osteoporosis   . Vitamin D deficiency   . Paroxysmal atrial fibrillation (HCC)     a. CHA2DS2VASc = 5-->coumadin;  b. previously on amio->d/c'd 11/2014 secondary to concern for possible amio lung.  . Essential hypertension   . LBBB (left bundle branch block)   . Severe aortic stenosis   . Hx of orthostatic hypotension   . Colon, diverticulosis   . Tachycardia-bradycardia ([place])   . Diabetes mellitus     diet controlled  . Esophageal reflux   . GERD (gastroesophageal reflux disease)   . Mitral regurgitation   . 1St degree AV block   . Shortness of breath dyspnea     for last 3 weeks - with any activity    Past Surgical History  Procedure Laterality Date  . Cataract extraction Bilateral   . Carpal tunnel release Bilateral   . Mri  08/18/07    head (diabetes heart [facility])  . Mediastinal removal of a goiter    . Cesarean section      x3  . Cardiac catheterization N/A 10/31/2015    Procedure: Right/Left Heart Cath and Coronary Angiography;  Surgeon: [name], MD;  Location: [facility];  Service:  Cardiovascular;  Laterality: N/A;    Family History  Problem Relation Age of Onset  . Diabetes Mother   . Cancer - Prostate Father   . Pulmonary embolism Father   . Hypertension Sister   . Diabetes Sister   . Hypertension Brother   . Arrhythmia Brother     afib  . CAD Brother   . Pulmonary fibrosis Brother     Social History   Social History  . Marital Status: Widowed    Spouse Name: N/A  . Number of Children: 3  . Years of Education: N/A   Occupational History  . retire     Social History Main Topics  . Smoking status: Never Smoker   . Smokeless tobacco: Never Used  . Alcohol Use: No  . Drug Use: No  . Sexual Activity: Not on file   Other Topics Concern  . Not on file   Social History Narrative   Lives in [place].  Widowed.    Current Outpatient Prescriptions  Medication Sig Dispense Refill  . acetaminophen (TYLENOL) 500 MG tablet Take 500 mg by mouth every 6 (six) hours as needed for moderate pain.    . Calcium Carb-Cholecalciferol (CALCIUM 600/VITAMIN D3 PO) Take 1 tablet by mouth 2 (two) times daily.    [name] lisinopril (PRINIVIL,ZESTRIL) 20 MG tablet Take 1 tablet (20 mg total) by mouth 2 (two) times daily. 60 tablet 11  . methimazole (TAPAZOLE) 5 MG tablet Take 7.5 mg by mouth daily.     . Multiple Vitamins-Minerals (OCUVITE PRESERVISION PO) Take 1 capsule by mouth daily.     . pravastatin (PRAVACHOL) 10 MG tablet Take 10 mg by mouth at bedtime.     [name] warfarin (COUMADIN) 2.5 MG tablet TAKE TABLETS BY MOUTH AS DIRECTED BY COUMADIN CLINIC (Patient not taking: Reported on 11/26/2015) 60 tablet 3   No current facility-administered medications for this visit.   Facility-Administered Medications Ordered in Other Visits  Medication Dose Route Frequency Provider Last Rate Last Dose  . [START ON 11/27/2015] 0.9 %  sodium chloride infusion   Intravenous Continuous [name], MD      . Derrill Memo ON 11/27/2015] cefUROXime (ZINACEF) 1.5 g in dextrose 5 % 50 mL IVPB  1.5 g Intravenous To OR Rexene Alberts, MD      . Derrill Memo ON 11/27/2015] dexmedetomidine (PRECEDEX) 400 MCG/100ML (4 mcg/mL) infusion  0.1-0.7 mcg/kg/hr Intravenous To OR Rexene Alberts, MD      . Derrill Memo ON 11/27/2015] DOPamine (INTROPIN) 800 mg in dextrose 5 % 250 mL (3.2 mg/mL) infusion  0-10 mcg/kg/min Intravenous To OR Rexene Alberts, MD      . Derrill Memo ON 11/27/2015] EPINEPHrine (ADRENALIN) 4 mg in dextrose 5 % 250 mL (0.016 mg/mL) infusion  0-10 mcg/min Intravenous To OR Rexene Alberts, MD       . Derrill Memo ON 11/27/2015] heparin 30,000 units/NS 1000 mL solution for CELLSAVER   Other To OR Rexene Alberts, MD      . Derrill Memo ON 11/27/2015] insulin regular (NOVOLIN R,HUMULIN R) 250 Units in sodium chloride 0.9 % 250 mL (1 Units/mL) infusion   Intravenous To OR Rexene Alberts, MD      . Derrill Memo ON 11/27/2015] magnesium sulfate (IV Push/IM) injection 40 mEq  40 mEq Other To OR Rexene Alberts, MD      . Derrill Memo ON 11/27/2015] nitroGLYCERIN 50 mg in dextrose 5 % 250 mL (0.2 mg/mL) infusion  2-200 mcg/min Intravenous To OR Rexene Alberts, MD      . [  START ON 11/27/2015] norepinephrine (LEVOPHED) 4 mg in dextrose 5 % 250 mL (0.016 mg/mL) infusion  0-10 mcg/min Intravenous To OR Rexene Alberts, MD      . Derrill Memo ON 11/27/2015] phenylephrine (NEO-SYNEPHRINE) 20 mg in dextrose 5 % 250 mL (0.08 mg/mL) infusion  30-200 mcg/min Intravenous To OR Rexene Alberts, MD      . Derrill Memo ON 11/27/2015] potassium chloride injection 80 mEq  80 mEq Other To OR Rexene Alberts, MD      . Derrill Memo ON 11/27/2015] vancomycin (VANCOCIN) 1,250 mg in sodium chloride 0.9 % 250 mL IVPB  1,250 mg Intravenous To OR Rexene Alberts, MD        Allergies  Allergen Reactions  . Amoxicillin Other (See Comments)    tongue felt bruised.  . Celebrex [Celecoxib] Other (See Comments)    Mouth sores      Review of Systems:  General:marginal appetite, decreased energy, no weight gain, no weight loss, no fever Cardiac:no chest pain with exertion, no chest pain at rest, + SOB with exertion, no resting SOB, no PND, no orthopnea, no palpitations, + arrhythmia, + atrial fibrillation, + LE edema, + dizzy spells, no syncope Respiratory:+ shortness of breath, no home oxygen, no productive cough, no dry cough, no bronchitis, no wheezing, no hemoptysis, no asthma, no pain with inspiration or cough, no sleep apnea, no CPAP at  night GI:no difficulty swallowing, no reflux, no frequent heartburn, no hiatal hernia, no abdominal pain, no constipation, no diarrhea, no hematochezia, no hematemesis, no melena GU:no dysuria, no frequency, no urinary tract infection, no hematuria, no kidney stones, no kidney disease Vascular:no pain suggestive of claudication, no pain in feet, no leg cramps, no varicose veins, no DVT, no non-healing foot ulcer Neuro:no stroke, no TIA's, no seizures, no headaches, notemporary blindness one eye, no slurred speech, no peripheral neuropathy, no chronic pain, mild instability of gait, mild memory/cognitive dysfunction Musculoskeletal:+ arthritis, no joint swelling, no myalgias, mild difficulty walking, slightly limited mobility  Skin:no rash, no itching, no skin infections, no pressure sores or ulcerations Psych:no anxiety, no depression, no nervousness, no unusual recent stress Eyes:no blurry vision, no floaters, no recent vision changes, + wears glasses or contacts ENT:+ hearing loss, no loose or painful teeth, no dentures, last saw dentist last year Hematologic:+ easy bruising, no abnormal bleeding, no clotting disorder, no frequent epistaxis Endocrine:+ diabetes, does check CBG's at home     Physical Exam:   BP 160/80 mmHg  Pulse 80  Resp 20  Ht 5' (1.524 m)  Wt 136 lb (61.689 kg)  BMI 26.56 kg/m2  SpO2 98%  General:  Elderly but  well-appearing  HEENT:  Unremarkable , NCAT, PERLA, EOMI, oropharynx clear  Neck:   no JVD, no bruits, no adenopathy, enlarged thyroid, old collar incision scar  from prior thyroid surgery  Chest:   clear to auscultation, symmetrical breath sounds, no wheezes, no rhonchi   CV:   RRR, grade III/VI crescendo/decrescendo murmur heard best at RSB, no no diastolic murmur  Abdomen:  soft, non-tender, no masses or organomegaly  Extremities:  warm, well-perfused, pulses palpable in feet, mild  LE edema to the knee bilaterally  Rectal/GU  Deferred  Neuro:   Grossly non-focal and symmetrical throughout  Skin:   Clean and dry, no rashes, no breakdown   Diagnostic Tests:  [facility]  [address], [place] [address]  [phone]  ------------------------------------------------------------------- Transthoracic Echocardiography  Patient: Alexa Mcdonald, Alexa Mcdonald MR #: [id number]  Study Date: 10/17/2015 Gender: F Age: 70 Height: 153.7 cm Weight: 60.1 kg BSA: 1.62 m^2 Pt. Status: Room:  Meribeth Mattes, MD [place], [place], M.D. SONOGRAPHER Cindy Hazy, RDCS PERFORMING [facility], Outpatient  cc:  ------------------------------------------------------------------- LV EF: 55% - 60%  ------------------------------------------------------------------- Indications: I35.9 Aortic Valve Disorder.  ------------------------------------------------------------------- History: PMH: Acquired from the patient and from the patient&'s chart. PMH: Paroxysmal Atrial Fibrillation. Chronic Kidney disease. LBBB. Tachy-Brady Syndrome. Risk factors: Diabetes mellitus.  ------------------------------------------------------------------- Study Conclusions  - Left ventricle: The cavity size was normal. Wall thickness was  increased in a pattern of mild LVH. Systolic function was normal.  The estimated ejection fraction was in the range of 55% to 60%.   Septal-lateral dyssynchrony. Wall motion was normal; there were  no regional wall motion abnormalities. Features are consistent  with a pseudonormal left ventricular filling pattern, with  concomitant abnormal relaxation and increased filling pressure  (grade 2 diastolic dysfunction). E/medial e&' > 15 suggests LV end  diastolic pressure at least 20 mmHg. - Aortic valve: Probably trileaflet; severely calcified leaflets.  There was severe stenosis. There was moderate regurgitation. Mean  gradient (S): 74 mm Hg. Valve area (VTI): 0.51 cm^2. - Mitral valve: Moderately calcified annulus. Mildly calcified  leaflets . There was moderate to severe regurgitation. - Left atrium: The atrium was severely dilated. - Right ventricle: The cavity size was normal. Systolic function  was normal. - Tricuspid valve: Peak RV-RA gradient (S): 62 mm Hg. - Pulmonary arteries: PA peak pressure: 65 mm Hg (S). - Inferior vena cava: The vessel was normal in size. The  respirophasic diameter changes were in the normal range (>= 50%),  consistent with normal central venous pressure.  Impressions:  - Normal LV size with mild LV hypertrophy. EF 55-60% with  septal-lateral dyssynchrony. Normal RV size and systolic  function. Severe aortic stenosis, moderate aortic insufficiency,  moderate to severe mitral regurgitation. Moderate pulmonary  hypertension.  Transthoracic echocardiography. M-mode, complete 2D, spectral Doppler, and color Doppler. Birthdate: Patient birthdate: 1921/04/11. Age: Patient is 82 yr old. Sex: Gender: female. BMI: 25.5 kg/m^2. Blood pressure: 134/70 Patient status: Outpatient. Study date: Study date: 10/17/2015. Study time: 01:32 PM. Location:  [facility]  -------------------------------------------------------------------  ------------------------------------------------------------------- Left ventricle: The cavity size was normal. Wall  thickness was increased in a pattern of mild LVH. Systolic function was normal. The estimated ejection fraction was in the range of 55% to 60%. Septal-lateral dyssynchrony. Wall motion was normal; there were no regional wall motion abnormalities. Features are consistent with a pseudonormal left ventricular filling pattern, with concomitant abnormal relaxation and increased filling pressure (grade 2 diastolic dysfunction). E/medial e&' > 15 suggests LV end diastolic pressure at least 20 mmHg.  ------------------------------------------------------------------- Aortic valve: Probably trileaflet; severely calcified leaflets. Doppler: There was severe stenosis. There was moderate regurgitation. VTI ratio of LVOT to aortic valve: 0.18. Valve area (VTI): 0.51 cm^2. Indexed valve area (VTI): 0.31 cm^2/m^2. Peak velocity ratio of LVOT to aortic valve: 0.19. Valve area (Vmax): 0.54 cm^2. Indexed valve area (Vmax): 0.33 cm^2/m^2. Mean velocity ratio of LVOT to aortic valve: 0.19. Valve area (Vmean): 0.55 cm^2. Indexed valve area (Vmean): 0.34 cm^2/m^2. Mean gradient (S): 74 mm Hg. Peak gradient (S): 110 mm Hg.  ------------------------------------------------------------------- Aorta: Aortic root: The aortic root was normal in size. Ascending aorta: The ascending aorta was normal in size.  ------------------------------------------------------------------- Mitral valve: Moderately calcified annulus. Mildly calcified leaflets . Doppler: There was no evidence for stenosis. There was moderate to severe  regurgitation. Peak gradient (D): 13 mm Hg.  ------------------------------------------------------------------- Left atrium: The atrium was severely dilated.  ------------------------------------------------------------------- Right ventricle: The cavity size was normal. Systolic function  was normal.  ------------------------------------------------------------------- Pulmonic valve: Structurally normal valve. Cusp separation was normal. Doppler: Transvalvular velocity was within the normal range. There was no regurgitation.  ------------------------------------------------------------------- Tricuspid valve: Doppler: There was mild regurgitation.  ------------------------------------------------------------------- Right atrium: The atrium was normal in size.  ------------------------------------------------------------------- Pericardium: There was no pericardial effusion.  ------------------------------------------------------------------- Systemic veins: Inferior vena cava: The vessel was normal in size. The respirophasic diameter changes were in the normal range (>= 50%), consistent with normal central venous pressure.  ------------------------------------------------------------------- Measurements  Left ventricle Value Reference LV ID, ED, PLAX chordal (L) 38.1 mm 43 - 52 LV ID, ES, PLAX chordal 28.4 mm 23 - 38 LV fx shortening, PLAX chordal (L) 25 % >=29 LV PW thickness, ED 11.9 mm --------- IVS/LV PW ratio, ED 1.14 <=1.3 Stroke volume, 2D 74 ml --------- Stroke volume/bsa, 2D 46 ml/m^2 --------- LV e&', lateral 7.68 cm/s --------- LV E/e&', lateral 23.31 --------- LV e&', medial 5.37 cm/s --------- LV E/e&', medial 33.33 --------- LV e&', average 6.53 cm/s --------- LV E/e&', average 27.43  ---------  Ventricular septum Value Reference IVS thickness, ED 13.6 mm ---------  LVOT Value Reference LVOT ID, S 19 mm --------- LVOT area 2.84 cm^2 --------- LVOT ID 19 mm --------- LVOT peak velocity, S 99.2 cm/s --------- LVOT mean velocity, S 78.7 cm/s --------- LVOT VTI, S 26.2 cm --------- LVOT peak gradient, S 4 mm Hg --------- Stroke volume (SV), LVOT DP 74.3 ml --------- Stroke index (SV/bsa), LVOT DP 45.9 ml/m^2 ---------  Aortic valve Value Reference Aortic valve peak velocity, S 524 cm/s --------- Aortic valve mean velocity, S 407 cm/s --------- Aortic valve VTI, S 147 cm --------- Aortic mean gradient, S 74 mm Hg --------- Aortic peak gradient, S 110 mm Hg --------- VTI ratio, LVOT/AV 0.18 --------- Aortic valve area, VTI 0.51 cm^2 --------- Aortic valve area/bsa, VTI 0.31 cm^2/m^2 --------- Velocity ratio, peak, LVOT/AV 0.19 --------- Aortic valve area, peak velocity 0.54 cm^2 --------- Aortic valve area/bsa, peak 0.33 cm^2/m^2 --------- velocity Velocity ratio, mean, LVOT/AV 0.19 --------- Aortic valve area, mean velocity 0.55 cm^2 --------- Aortic valve area/bsa, mean 0.34 cm^2/m^2 --------- velocity Aortic regurg pressure  half-time 391 ms ---------  Aorta Value Reference Aortic root ID, ED 30 mm ---------  Left atrium Value Reference LA ID, A-P, ES 44 mm --------- LA ID/bsa, A-P (H) 2.72 cm/m^2 <=2.2 LA volume, S 126 ml --------- LA volume/bsa, S 77.9 ml/m^2 --------- LA volume, ES, 1-p A4C 99 ml --------- LA volume/bsa, ES, 1-p A4C 61.2 ml/m^2 --------- LA volume, ES, 1-p A2C 146 ml --------- LA volume/bsa, ES, 1-p A2C 90.3 ml/m^2 ---------  Mitral valve Value Reference Mitral E-wave peak velocity 179 cm/s --------- Mitral A-wave peak velocity 132 cm/s --------- Mitral deceleration time 183 ms 150 - 230 Mitral peak gradient, D 13 mm Hg --------- Mitral E/A ratio, peak 1.4 --------- Mitral regurg VTI, PISA 259 cm ---------  Pulmonary arteries Value Reference PA pressure, S, DP (H) 65 mm Hg <=30  Tricuspid valve Value Reference Tricuspid peak RV-RA gradient 61 mm Hg ---------  Right ventricle Value Reference RV s&', lateral, S 10.9 cm/s ---------  Legend: (L) and (H) mark values outside specified reference range.  ------------------------------------------------------------------- Prepared and Electronically Authenticated by  Loralie Champagne,  M.D. 2017-03-15T22:39:46     [name], MD (Primary)      Procedures    Right/Left  Heart Cath and Coronary Angiography    Conclusion     Ost LM lesion, 40% stenosed.  Mid Cx lesion, 25% stenosed.  2nd Diag lesion, 40% stenosed.  Dist LAD lesion, 25% stenosed.  1. Severe aortic stenosis with calculated aortic valve area 0.8 cm 2. Mild diffuse nonobstructive coronary artery disease without significant flow-limiting stenosis 3. Well compensated intracardiac hemodynamics with pulmonary wedge pressure of 13, LVEDP 11 and mild pulmonary hypertension with a mean PA pressure 27     Indications    Severe aortic stenosis [I35.0 (ICD-10-CM)]    Technique and Indications    INDICATION: Severe symptomatic aortic stenosis  PROCEDURAL DETAILS: The right groin was prepped, draped, and anesthetized with 1% lidocaine. Using the modified Seldinger technique a 4 French sheath was placed in the right femoral artery and a 7 French sheath was placed in the right femoral vein. A Swan-Ganz catheter was used for the right heart catheterization. Standard protocol was followed for recording of right heart pressures and sampling of oxygen saturations. Fick cardiac output was calculated. Standard Judkins catheters were used for selective coronary angiography and left ventriculography. There were no immediate procedural complications. The patient was transferred to the post catheterization recovery area for further monitoring.  During this procedure the patient is administered a total of Versed 1 mg and Fentanyl 25 mg to achieve and maintain moderate conscious sedation. The patient's heart rate, blood pressure, and oxygen saturation are monitored continuously during the procedure. The period of conscious sedation is 32 minutes, of which I was present face-to-face 100% of this time. Estimated blood loss <50 mL. There were no immediate complications during the procedure.    Coronary Findings     Dominance: Right   Left Main   . Ost LM lesion, 40% stenosed. The left main has calcification at the origin. There is an eccentric 30-40% stenosis. There is no pressure dampening with the catheter beyond the lesion.     Left Anterior Descending   . Dist LAD lesion, 25% stenosed. Mild diffuse mid LAD stenosis is present.   . Second Diagonal Branch   . 2nd Diag lesion, 40% stenosed. The second diagonal is large in caliber. The vessel is larger than the mid and distal LAD. There is mild nonobstructive stenosis present.     Left Circumflex   . Mid Cx lesion, 25% stenosed. Calcified diffuse.     Right Coronary Artery  . Vessel is large. This is a large, dominant vessel. The vessel supplies a large PLA and a large PDA. There is mild diffuse nonobstructive plaquing present but no significant stenosis is identified throughout the distribution of the right coronary artery.       Right Heart Pressures Hemodynamic findings consistent with aortic stenosis.    Left Heart    Mitral Valve The annulus is calcified. The mitral annulus is heavily calcified.   Aortic Valve There is severe aortic valve stenosis. The aortic valve is calcified. There is restricted aortic valve motion. There is severe aortic stenosis. The peak to peak gradient is 47 mmHg, mean gradient 32 mmHg, calculated aortic valve area 0.79 cm   Aorta Aortic root injections are performed. There is moderate aortic insufficiency present. The aortic leaflets are calcified and restricted. The proximal ascending aorta appears normal in size.    Coronary Diagrams    Diagnostic Diagram            Implants     No implant documentation for this case.    PACS Images  Show images for Cardiac catheterization     Link to Procedure Log    Procedure Log      Hemo Data       Most Recent Value   Fick Cardiac Output  4.09 L/min   Fick Cardiac Output Index  2.59 (L/min)/BSA   Aortic Mean Gradient  31.7  mmHg   Aortic Peak Gradient  47 mmHg   Aortic Valve Area  0.79   Aortic Value Area Index  0.5 cm2/BSA   RA A Wave  -99 mmHg   RA V Wave  14 mmHg   RA Mean  10 mmHg   RV Systolic Pressure  38 mmHg   RV Diastolic Pressure  7 mmHg   RV EDP  10 mmHg   PA Systolic Pressure  42 mmHg   PA Diastolic Pressure  20 mmHg   PA Mean  27 mmHg   PW A Wave  -99 mmHg   PW V Wave  15 mmHg   PW Mean  13 mmHg   AO Systolic Pressure  [id number] mmHg   AO Diastolic Pressure  54 mmHg   AO Mean  75 mmHg   LV Systolic Pressure  [id number] mmHg   LV Diastolic Pressure  8 mmHg   LV EDP  11 mmHg   Arterial Occlusion Pressure Extended Systolic Pressure  [id number] mmHg   Arterial Occlusion Pressure Extended Diastolic Pressure  50 mmHg   Arterial Occlusion Pressure Extended Mean Pressure  70 mmHg   Left Ventricular Apex Extended Systolic Pressure  0000000 mmHg   Left Ventricular Apex Extended Diastolic Pressure  7 mmHg   Left Ventricular Apex Extended EDP Pressure  11 mmHg   QP/QS  1   TPVR Index  10.44 HRUI   TSVR Index  29.01 HRUI   PVR SVR Ratio  0.2   TPVR/TSVR Ratio  0.36    ADDENDUM REPORT: 10/25/2015 14:19 CLINICAL DATA: Severe aortic stenosis EXAM: Cardiac TAVR CT TECHNIQUE: The patient was scanned on a Philips 256 scanner. A 120 kV retrospective scan was triggered in the descending thoracic aorta at 111 HU's. Gantry rotation speed was 270 msecs and collimation was .9 mm. 5 mg of iv metoprolol and no nitro were given. The 3D data set was reconstructed in 5% intervals of the R-R cycle. Systolic and diastolic phases were analyzed on a dedicated work station using MPR, MIP and VRT modes. The patient received 80 cc of contrast. FINDINGS: Aortic Valve: Trileaflet, severely thickened and calcified with severely restricted leaflet opening. Only mild calcifications present in the subvalvular apparatus in the LVOT. Aorta: Normal size, no dissection. Moderate diffuse calcifications in the  aortic arch and descending thoracic aorta. Sinotubular Junction: 26 x 25 mm Ascending Thoracic Aorta: 33 x 31 mm Aortic Arch: 27 x 26 mm Descending Thoracic Aorta: 28 x 25 mm Sinus of Valsalva Measurements: Non-coronary: 30 mm Right -coronary: 28 mm Left -coronary: 30 mm Coronary Artery Height above Annulus: Left Main: 9.6 mm Right Coronary: 17 mm Virtual Basal Annulus Measurements: Maximum/Minimum Diameter: 27 x 20 mm Perimeter: 83 mm Area: 408 mm2 Optimum Fluoroscopic Angle for Delivery: LAO 1 CAU 1 Pulmonary artery measures 39 x 36 mm suggestive of pulmonary hypertension. IMPRESSION: 1. Trileaflet, severely thickened and calcified with severely restricted leaflet opening with annular measurements suitable for delivery of 23 mm Edward-SAPIEN 3 valve. 2. Sufficient coronary to annulus distance. 3. Optimum Fluoroscopic Angle for Delivery: LAO 1 CAU 1. 4. Dilated pulmonary artery suggestive of pulmonary hypertension. Ena Dawley  Electronically Signed  By: Ena Dawley  On: 10/25/2015 14:19   CLINICAL DATA: 82 year old female with history of severe aortic stenosis. Preprocedural study prior to potential transcatheter aortic valve replacement (TAVR) procedure.  EXAM: CT ANGIOGRAPHY CHEST, ABDOMEN AND PELVIS  TECHNIQUE: Multidetector CT imaging through the chest, abdomen and pelvis was performed using the standard protocol during bolus administration of intravenous contrast. Multiplanar reconstructed images and MIPs were obtained and reviewed to evaluate the vascular anatomy.  CONTRAST: 76mL OMNIPAQUE IOHEXOL 350 MG/ML SOLN  COMPARISON: Chest CT 11/03/2014. CT neck, chest, abdomen and pelvis 04/25/2009.  FINDINGS: CTA CHEST FINDINGS  Mediastinum/Lymph Nodes: Heart size is mildly enlarged. There is a small filling defect in the tip of the left atrial appendage concerning for potential thrombus (although this may simply reflect pseudo  thrombus secondary to incomplete mixing of contrast material). There is no significant pericardial fluid, thickening or pericardial calcification. There is atherosclerosis of the thoracic aorta, the great vessels of the mediastinum and the coronary arteries, including calcified atherosclerotic plaque in the left main, left anterior descending, left circumflex and right coronary arteries. Severe thickening calcification of the aortic valve. Severe mitral annular calcifications. The thyroid gland is again markedly enlarged and heterogeneous in appearance with the largest mass-like component of the thyroid gland in the left lobe measuring up to 5.3 x 6.4 cm, very similar to prior study from 11/03/2014, most compatible with a multinodular goiter. There is apparent high-grade stenosis of the right axillary and subclavian veins, with numerous right-sided chest wall collateral veins. There are multiple borderline enlarged and mildly enlarged mediastinal and bilateral hilar lymph nodes, largest of which measures up to 1.2 cm in the low right paratracheal nodal station. The esophagus is unremarkable in appearance. No axillary lymphadenopathy.  Lungs/Pleura: Compared to the prior examination there is a new 12 x 8 mm (mean diameter of 10 mm) ground-glass attenuation nodule in the superior segment of the left lower lobe (image 18 of series 407). No other larger more suspicious appearing pulmonary nodules or masses are noted. There are areas of subpleural reticulation, parenchymal banding in mild traction bronchiectasis scattered throughout the lungs bilaterally, with a mild craniocaudal gradient. These findings are very similar to prior study 11/03/2014. No acute consolidative airspace disease. No pleural effusions.  Musculoskeletal/Soft Tissues: There are no aggressive appearing lytic or blastic lesions noted in the visualized portions of the skeleton.  CTA ABDOMEN AND PELVIS  FINDINGS  Hepatobiliary: No suspicious appearing cystic or solid hepatic lesions. No intra or extrahepatic biliary ductal dilatation. Gallbladder is unremarkable in appearance.  Pancreas: No pancreatic mass. No pancreatic ductal dilatation. No pancreatic or peripancreatic fluid or inflammatory changes.  Spleen: Unremarkable.  Adrenals/Urinary Tract: Mild bilateral renal atrophy. Bilateral kidneys and bilateral adrenal glands are otherwise normal in appearance. No hydroureteronephrosis. Urinary bladder is normal in appearance.  Stomach/Bowel: Normal appearance of the stomach. No pathologic dilatation of small bowel or colon. There are numerous colonic diverticulaei, most pronounced in the descending colon and sigmoid colon, without surrounding inflammatory changes to suggest an acute diverticulitis at this time.  Vascular/Lymphatic: Vascular findings and measurements pertinent to potential TAVR procedure, as detailed below. No aneurysm or dissection identified in the abdominal or pelvic vasculature. Single right renal artery. Two left renal arteries. Celiac axis, superior mesenteric artery and inferior mesenteric artery are all patent.  Reproductive: Uterus and ovaries are atrophic.  Other: Small bilateral inguinal hernias (left greater than right) containing only fat. No significant volume of ascites. No pneumoperitoneum.  Musculoskeletal: There are  no aggressive appearing lytic or blastic lesions noted in the visualized portions of the skeleton.  VASCULAR MEASUREMENTS PERTINENT TO TAVR:  AORTA:  Minimal Aortic Diameter - 12 x 14 mm  Severity of Aortic Calcification - severe  RIGHT PELVIS:  Right Common Iliac Artery -  Minimal Diameter - 9.5 x 9.4 mm  Tortuosity - mild  Calcification - moderate  Right External Iliac Artery -  Minimal Diameter - 7.2 x 6.0 mm  Tortuosity - moderate  Calcification - none  Right Common Femoral Artery  -  Minimal Diameter - 6.8 x 7.4 mm  Tortuosity - mild  Calcification - moderate to severe  LEFT PELVIS:  Left Common Iliac Artery -  Minimal Diameter - 4.9 x 11.3 mm  Tortuosity - moderate  Calcification - moderate  Left External Iliac Artery -  Minimal Diameter - 8.0 x 7.9 mm  Tortuosity - severe  Calcification - minimal  Left Common Femoral Artery -  Minimal Diameter - 3.7 x 5.4 mm  Tortuosity - mild  Calcification - moderate to severe  Review of the MIP images confirms the above findings.  IMPRESSION: 1. Findings and measurements pertinent to potential TAVR procedure, as detailed above. This patient does appear to have suitable right-sided pelvic arterial access. 2. Thickening calcification of the aortic valve, compatible with the reported clinical history of severe aortic stenosis. 3. Small filling defect in the tip of the left atrial appendage. This is favored to represent pseudothrombus, but close attention at time of future transesophageal echocardiogram is recommended to exclude the possibility of true left atrial appendage thrombus. 4. New ground-glass attenuation nodule with a mean diameter of 10 mm in the superior segment of the left lower lobe. Initial follow-up with CT at 6-12 months is recommended to confirm persistence. If persistent, repeat CT is recommended every 2 years until 5 years of stability has been established. This recommendation follows the consensus statement: Guidelines for Management of Incidental Pulmonary Nodules Detected on CT Images:From the Fleischner Society 2017; published online before print (10.1148/radiol.IJ:2314499). 5. Severe colonic diverticulosis without evidence to suggest an acute diverticulitis at this time. 6. The appearance of the lungs remains compatible with interstitial lung disease. However, given the stability compared to the prior study 11/03/2014, this may simply represent mild fibrotic  phase nonspecific interstitial pneumonia (NSIP), as usual interstitial pneumonia (UIP) is typically progressive. 7. Multiple borderline enlarged and minimally enlarged mediastinal and bilateral hilar lymph nodes, likely chronic related to underlying interstitial lung disease. 8. Multinodular goiter redemonstrated. 9. Additional incidental findings, as above.   Electronically Signed  By: Vinnie Langton M.D.  On: 10/24/2015 13:33       Impression:  This 82 year old patient has stage D severe symptomatic aortic stenosis with NYHA class III symptoms of exertional fatigue and shortness of breath consistent with chronic diastolic heart failure. I have personally reviewed the patient's echocardiogram, cardiac cath, and CT angiograms. She has critical aortic stenosis with severe thickening, calcification, and restricted leaflet mobility involving all 3 leaflets of the  aortic valve. The mean transvalvular gradient was estimated at 74 mmHg. She also has significant mitral annular calcification and at moderate to severe mitral regurgitation.Left ventricular systolic function appears preserved with an ejection fraction of 55-60%. Diagnostic cardiac catheterization shows no significant coronary artery disease.   I think that the risks associated with conventional surgical aortic valve replacement would be high because of the patient's advanced age. She also has significant calcification of the aortic root. I would not consider  her a candidate for conventional surgery. Options include transcatheter aortic valve replacement versus long-term palliative medical therapy. Cardiac gated CT angiogram of the heart demonstrates findings consistent with severe aortic stenosis with anatomical features suitable for transcatheter aortic valve replacement without any significant, complicating features. CT angiography of the abdomen and pelvis shows significant aortoiliac disease with heavy calcification, but  the patient appears to have adequate pelvic vascular access for a transfemoral approach. She is still independent and mobile at 82 years old and I think TAVR is going to be the best treatment for her.   The patient and her son were counseled at length regarding treatment alternatives for management of severe symptomatic aortic stenosis. Alternative approaches such as conventional aortic valve replacement, transcatheter aortic valve replacement, and palliative medical therapy were compared and contrasted at length. The risks associated with conventional surgical aortic valve replacement were been discussed in detail, as were expectations for post-operative convalescence. Long-term prognosis with medical therapy was discussed. She would like to proceed with TAVR.  We discussed complications that might develop including but not limited to risks of death, stroke, paravalvular leak, aortic dissection or other major vascular complications, aortic annulus rupture, device embolization, cardiac rupture or perforation, mitral regurgitation, acute myocardial infarction, arrhythmia, heart block or bradycardia requiring permanent pacemaker placement, congestive heart failure, respiratory failure, renal failure, pneumonia, infection, other late complications related to structural valve deterioration or migration, or other complications that might ultimately cause a temporary or permanent loss of functional independence or other long term morbidity. The patient provides full informed consent for the procedure as described and all questions were answered.    Plan:  Transfemoral TAVR on 11/27/2015    Gaye Pollack, MD 11/26/2015 7:41 PM

## 2015-11-26 NOTE — Progress Notes (Signed)
Anesthesia Chart Review: Patient is a 82 year old female scheduled for TAVR, transfemoral approach on 11/27/15 by Dr. [name]. TAVR was first discussed in 02/2014, but patient declined due to being relatively asymptomatic; however more recently she has developed progressive DOE, dizziness and fatigue.   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 Recent CTA 10/2015 showed multi-nodular goiter, findings suggestive of interstitial pneumonitis, LLL lung nodule, and a small filling defect in the tip of the left atrial appendage on 10/2015 CTA favored to represent pseudothrombus, but close attention on future TEE recommended. (Dr. [name] commented on 10/28/15, that he would "ask Dr. Meda Coffee to review the LAA as the chest CT reported 'pseudothrombus' in the LAA." I did not see additional input, so I called and spoke with [facility] RN Ryan this afternoon. She reported there has been on-going communication with the cardiologists and surgeons regarding this. At this point, they will plan to re-look at LAA in the pre-operative TEE on the day of surgery. Patient is also scheduled to see Dr. Cyndia Bent for another opinion this afternoon.)   PCP is Dr. Jonathon Jordan. Primary cardiologist is Dr. Fransico Him. Pulmonologist is Dr. Simonne Maffucci (previously saw Dr. Gwenette Greet). Last visit 08/09/15. With her advanced age and lack of pulmonary symptoms he did not see a role for empiric treatment of ILD. One year follow-up recommended.  Meds include lisinopril, Tapazole, pravastatin, warfarin. [facility] instructed her to hold warfarin five days prior to surgery.  11/26/15 EKG: Afib at 85 bpm, LAD, left BBB. Known PAF history, but appeared to to be in SR on  10/03/15. [facility] RN Thurmond Butts notified and will review with Dr. Cyndia Bent and/or Dr. [name] when patient is seen there this afternoon.  10/17/15 Echo: Study Conclusions - Left ventricle: The cavity size was normal. Wall thickness was  increased in a pattern of mild LVH. Systolic function was normal.  The estimated ejection fraction was in the range of 55% to 60%.  Septal-lateral dyssynchrony. Wall motion was normal; there were  no regional wall motion abnormalities. Features are consistent  with a pseudonormal left ventricular filling pattern, with  concomitant abnormal relaxation and increased filling pressure  (grade 2 diastolic dysfunction). E/medial e&' > 15 suggests LV end  diastolic pressure at least 20 mmHg. - Aortic valve: Probably trileaflet; severely calcified leaflets.  There was severe stenosis. There was moderate regurgitation. Mean  gradient (S): 74 mm Hg. Valve area (VTI): 0.51 cm^2. - Mitral valve: Moderately calcified annulus. Mildly calcified  leaflets . There was moderate to severe regurgitation. - Left atrium: The atrium was severely dilated. - Right ventricle: The cavity size was normal. Systolic function  was normal. - Tricuspid valve: Peak RV-RA gradient (S): 62 mm Hg. - Pulmonary arteries: PA peak pressure: 65 mm Hg (S). - Inferior vena cava: The vessel was normal in size. The  respirophasic diameter changes were in the normal range (>= 50%),  consistent with normal central venous pressure. Impressions: - Normal LV size with mild LV hypertrophy. EF 55-60% with  septal-lateral dyssynchrony. Normal RV size and systolic  function. Severe aortic stenosis, moderate aortic insufficiency,  moderate to severe mitral regurgitation. Moderate pulmonary  hypertension.  10/31/15 RHC/LHC:  Ost LM lesion, 40% stenosed.  Mid Cx lesion, 25% stenosed.  2nd Diag lesion, 40% stenosed.  Dist LAD lesion, 25% stenosed. 1. Severe aortic stenosis with calculated aortic  valve area 0.8 cm 2. Mild diffuse nonobstructive coronary artery disease without significant flow-limiting stenosis 3. Well compensated intracardiac hemodynamics with pulmonary wedge pressure of 13, LVEDP 11 and mild pulmonary hypertension with a mean PA pressure 27.  10/24/15 CTA chest/abd/pelvis: IMPRESSION: 1. Findings and measurements pertinent to potential TAVR procedure, as detailed above. This patient does appear to have suitable right-sided pelvic arterial access. 2. Thickening calcification of the aortic valve, compatible with the reported clinical history of severe aortic stenosis. 3. Small filling defect in the tip of the left atrial appendage. This is favored to represent pseudothrombus, but close attention at time of future transesophageal echocardiogram is recommended to exclude the possibility of true left atrial appendage thrombus. 4. New ground-glass attenuation nodule with a mean diameter of 10 mm in the superior segment of the left lower lobe. Initial follow-up with CT at 6-12 months is recommended to confirm persistence. If persistent, repeat CT is recommended every 2 years until 5 years of stability has been established. This recommendation follows the consensus statement: Guidelines for Management of Incidental Pulmonary Nodules Detected on CT Images:From the Fleischner Society 2017; published online before print (10.1148/radiol.IJ:2314499). 5. Severe colonic diverticulosis without evidence to suggest an acute diverticulitis at this time. 6. The appearance of the lungs remains compatible with interstitial lung disease. However, given the stability compared to the prior study 11/03/2014, this may simply represent mild fibrotic phase nonspecific interstitial pneumonia (NSIP), as usual interstitial pneumonia (UIP) is typically progressive. 7. Multiple borderline enlarged and minimally enlarged mediastinal and bilateral hilar lymph nodes, likely chronic related  to underlying interstitial lung disease. 8. Multinodular goiter redemonstrated. 9. Additional incidental findings, as above. (See full report)  08/09/15 Pulmonary function testing ratio 90%, FEV1 1.51 L, FVC 1.67 L, TLC 3.70 L (83% predicted), DLCO 13.19 (69% predicted).  11/26/15 CXR: IMPRESSION: Cardiomegaly with chronic interstitial lung disease, as on prior CT. No convincing evidence of acute superimposed process.  Preoperative labs noted. PT 17.2. INR 1.39. PTT 32. CBC WNL. Cr 1.18. Glucose 133. UA showed moderate leukocytes, negative nitrites, large hgb. (UA routed to [facility] [employer].) A1c pending.   Further evaluation on the day of surgery by her anesthesiologist.   Myra Gianotti, PA-C [facility]/Anesthesiology Phone [phone] 11/26/2015 4:32 PM

## 2015-11-27 ENCOUNTER — Inpatient Hospital Stay (HOSPITAL_COMMUNITY): Payer: Medicare Other | Admitting: Anesthesiology

## 2015-11-27 ENCOUNTER — Inpatient Hospital Stay (HOSPITAL_COMMUNITY): Payer: Medicare Other

## 2015-11-27 ENCOUNTER — Inpatient Hospital Stay (HOSPITAL_COMMUNITY)
Admission: RE | Admit: 2015-11-27 | Discharge: 2015-11-30 | DRG: 266 | Disposition: A | Discharge status: Skilled Nursing Facility | Payer: Medicare Other | Source: Ambulatory Visit | Attending: Cardiovascular Disease | Admitting: Cardiovascular Disease

## 2015-11-27 ENCOUNTER — Inpatient Hospital Stay (HOSPITAL_COMMUNITY): Payer: Medicare Other | Admitting: Vascular Surgery

## 2015-11-27 ENCOUNTER — Encounter (HOSPITAL_COMMUNITY)
Admission: RE | Disposition: A | Discharge status: Skilled Nursing Facility | Payer: Self-pay | Source: Ambulatory Visit | Attending: Cardiovascular Disease

## 2015-11-27 ENCOUNTER — Encounter (HOSPITAL_COMMUNITY): Payer: Self-pay | Admitting: Surgery

## 2015-11-27 DIAGNOSIS — J84112 Idiopathic pulmonary fibrosis: Secondary | ICD-10-CM | POA: Diagnosis present

## 2015-11-27 DIAGNOSIS — I509 Heart failure, unspecified: Secondary | ICD-10-CM

## 2015-11-27 DIAGNOSIS — I130 Hypertensive heart and chronic kidney disease with heart failure and stage 1 through stage 4 chronic kidney disease, or unspecified chronic kidney disease: Secondary | ICD-10-CM | POA: Diagnosis present

## 2015-11-27 DIAGNOSIS — Z952 Presence of prosthetic heart valve: Secondary | ICD-10-CM

## 2015-11-27 DIAGNOSIS — Z881 Allergy status to other antibiotic agents status: Secondary | ICD-10-CM | POA: Diagnosis not present

## 2015-11-27 DIAGNOSIS — I083 Combined rheumatic disorders of mitral, aortic and tricuspid valves: Secondary | ICD-10-CM | POA: Diagnosis present

## 2015-11-27 DIAGNOSIS — R262 Difficulty in walking, not elsewhere classified: Secondary | ICD-10-CM | POA: Diagnosis not present

## 2015-11-27 DIAGNOSIS — I5033 Acute on chronic diastolic (congestive) heart failure: Secondary | ICD-10-CM | POA: Diagnosis present

## 2015-11-27 DIAGNOSIS — J189 Pneumonia, unspecified organism: Secondary | ICD-10-CM | POA: Diagnosis present

## 2015-11-27 DIAGNOSIS — E042 Nontoxic multinodular goiter: Secondary | ICD-10-CM | POA: Diagnosis present

## 2015-11-27 DIAGNOSIS — N183 Chronic kidney disease, stage 3 unspecified: Secondary | ICD-10-CM | POA: Diagnosis present

## 2015-11-27 DIAGNOSIS — I272 Other secondary pulmonary hypertension: Secondary | ICD-10-CM | POA: Diagnosis present

## 2015-11-27 DIAGNOSIS — I2510 Atherosclerotic heart disease of native coronary artery without angina pectoris: Secondary | ICD-10-CM | POA: Diagnosis present

## 2015-11-27 DIAGNOSIS — I481 Persistent atrial fibrillation: Secondary | ICD-10-CM | POA: Diagnosis not present

## 2015-11-27 DIAGNOSIS — I340 Nonrheumatic mitral (valve) insufficiency: Secondary | ICD-10-CM | POA: Diagnosis present

## 2015-11-27 DIAGNOSIS — Z5189 Encounter for other specified aftercare: Secondary | ICD-10-CM | POA: Diagnosis not present

## 2015-11-27 DIAGNOSIS — M810 Age-related osteoporosis without current pathological fracture: Secondary | ICD-10-CM | POA: Diagnosis present

## 2015-11-27 DIAGNOSIS — J9811 Atelectasis: Secondary | ICD-10-CM

## 2015-11-27 DIAGNOSIS — Z79899 Other long term (current) drug therapy: Secondary | ICD-10-CM

## 2015-11-27 DIAGNOSIS — D62 Acute posthemorrhagic anemia: Secondary | ICD-10-CM | POA: Diagnosis not present

## 2015-11-27 DIAGNOSIS — I480 Paroxysmal atrial fibrillation: Secondary | ICD-10-CM | POA: Diagnosis not present

## 2015-11-27 DIAGNOSIS — I350 Nonrheumatic aortic (valve) stenosis: Secondary | ICD-10-CM

## 2015-11-27 DIAGNOSIS — Z9841 Cataract extraction status, right eye: Secondary | ICD-10-CM | POA: Diagnosis not present

## 2015-11-27 DIAGNOSIS — Z9842 Cataract extraction status, left eye: Secondary | ICD-10-CM | POA: Diagnosis not present

## 2015-11-27 DIAGNOSIS — K219 Gastro-esophageal reflux disease without esophagitis: Secondary | ICD-10-CM | POA: Diagnosis present

## 2015-11-27 DIAGNOSIS — I447 Left bundle-branch block, unspecified: Secondary | ICD-10-CM | POA: Diagnosis present

## 2015-11-27 DIAGNOSIS — Z7901 Long term (current) use of anticoagulants: Secondary | ICD-10-CM

## 2015-11-27 DIAGNOSIS — R0602 Shortness of breath: Secondary | ICD-10-CM | POA: Diagnosis not present

## 2015-11-27 DIAGNOSIS — K5730 Diverticulosis of large intestine without perforation or abscess without bleeding: Secondary | ICD-10-CM | POA: Diagnosis present

## 2015-11-27 DIAGNOSIS — J849 Interstitial pulmonary disease, unspecified: Secondary | ICD-10-CM | POA: Diagnosis present

## 2015-11-27 DIAGNOSIS — Z006 Encounter for examination for normal comparison and control in clinical research program: Secondary | ICD-10-CM

## 2015-11-27 DIAGNOSIS — I4819 Other persistent atrial fibrillation: Secondary | ICD-10-CM | POA: Diagnosis present

## 2015-11-27 DIAGNOSIS — M6281 Muscle weakness (generalized): Secondary | ICD-10-CM | POA: Diagnosis not present

## 2015-11-27 DIAGNOSIS — I10 Essential (primary) hypertension: Secondary | ICD-10-CM | POA: Diagnosis present

## 2015-11-27 DIAGNOSIS — E1122 Type 2 diabetes mellitus with diabetic chronic kidney disease: Secondary | ICD-10-CM | POA: Diagnosis present

## 2015-11-27 DIAGNOSIS — Z954 Presence of other heart-valve replacement: Secondary | ICD-10-CM | POA: Diagnosis not present

## 2015-11-27 DIAGNOSIS — J811 Chronic pulmonary edema: Secondary | ICD-10-CM | POA: Diagnosis not present

## 2015-11-27 DIAGNOSIS — Z888 Allergy status to other drugs, medicaments and biological substances status: Secondary | ICD-10-CM | POA: Diagnosis not present

## 2015-11-27 HISTORY — DX: Unspecified hearing loss, unspecified ear: H91.90

## 2015-11-27 HISTORY — PX: TEE WITHOUT CARDIOVERSION: SHX5443

## 2015-11-27 HISTORY — DX: Presence of prosthetic heart valve: Z95.2

## 2015-11-27 HISTORY — PX: TRANSCATHETER AORTIC VALVE REPLACEMENT, TRANSFEMORAL: SHX6400

## 2015-11-27 LAB — GLUCOSE, CAPILLARY
Glucose-Capillary: 117 mg/dL — ABNORMAL HIGH (ref 65–99)
Glucose-Capillary: 130 mg/dL — ABNORMAL HIGH (ref 65–99)
Glucose-Capillary: 166 mg/dL — ABNORMAL HIGH (ref 65–99)

## 2015-11-27 LAB — CREATININE, SERUM
Creatinine, Ser: 1.06 mg/dL — ABNORMAL HIGH (ref 0.44–1.00)
GFR calc Af Amer: 51 mL/min — ABNORMAL LOW (ref >60)
GFR calc non Af Amer: 44 mL/min — ABNORMAL LOW (ref >60)

## 2015-11-27 LAB — CBC
HCT: 30.1 % — ABNORMAL LOW (ref 36.0–46.0)
HCT: 29.7 % — ABNORMAL LOW (ref 36.0–46.0)
Hemoglobin: 9.6 g/dL — ABNORMAL LOW (ref 12.0–15.0)
Hemoglobin: 9.6 g/dL — ABNORMAL LOW (ref 12.0–15.0)
MCH: 28.2 pg (ref 26.0–34.0)
MCH: 28.6 pg (ref 26.0–34.0)
MCHC: 31.9 g/dL (ref 30.0–36.0)
MCHC: 32.3 g/dL (ref 30.0–36.0)
MCV: 88.5 fL (ref 78.0–100.0)
MCV: 88.4 fL (ref 78.0–100.0)
Platelets: 191 10*3/uL (ref 150–400)
Platelets: 184 10*3/uL (ref 150–400)
RBC: 3.4 MIL/uL — ABNORMAL LOW (ref 3.87–5.11)
RBC: 3.36 MIL/uL — ABNORMAL LOW (ref 3.87–5.11)
RDW: 14.2 % (ref 11.5–15.5)
RDW: 14.2 % (ref 11.5–15.5)
WBC: 8.7 10*3/uL (ref 4.0–10.5)
WBC: 10.3 10*3/uL (ref 4.0–10.5)

## 2015-11-27 LAB — POCT I-STAT, CHEM 8
BUN: 22 mg/dL — ABNORMAL HIGH (ref 6–20)
BUN: 21 mg/dL — ABNORMAL HIGH (ref 6–20)
BUN: 21 mg/dL — ABNORMAL HIGH (ref 6–20)
BUN: 20 mg/dL (ref 6–20)
BUN: 18 mg/dL (ref 6–20)
Calcium, Ion: 1.19 mmol/L (ref 1.13–1.30)
Calcium, Ion: 1.17 mmol/L (ref 1.13–1.30)
Calcium, Ion: 1.18 mmol/L (ref 1.13–1.30)
Calcium, Ion: 1.18 mmol/L (ref 1.13–1.30)
Calcium, Ion: 1.15 mmol/L (ref 1.13–1.30)
Chloride: 104 mmol/L (ref 101–111)
Chloride: 105 mmol/L (ref 101–111)
Chloride: 105 mmol/L (ref 101–111)
Chloride: 105 mmol/L (ref 101–111)
Chloride: 104 mmol/L (ref 101–111)
Creatinine, Ser: 0.8 mg/dL (ref 0.44–1.00)
Creatinine, Ser: 0.8 mg/dL (ref 0.44–1.00)
Creatinine, Ser: 0.8 mg/dL (ref 0.44–1.00)
Creatinine, Ser: 1 mg/dL (ref 0.44–1.00)
Creatinine, Ser: 1 mg/dL (ref 0.44–1.00)
Glucose, Bld: 144 mg/dL — ABNORMAL HIGH (ref 65–99)
Glucose, Bld: 138 mg/dL — ABNORMAL HIGH (ref 65–99)
Glucose, Bld: 138 mg/dL — ABNORMAL HIGH (ref 65–99)
Glucose, Bld: 150 mg/dL — ABNORMAL HIGH (ref 65–99)
Glucose, Bld: 186 mg/dL — ABNORMAL HIGH (ref 65–99)
HCT: 32 % — ABNORMAL LOW (ref 36.0–46.0)
HCT: 30 % — ABNORMAL LOW (ref 36.0–46.0)
HCT: 30 % — ABNORMAL LOW (ref 36.0–46.0)
HCT: 29 % — ABNORMAL LOW (ref 36.0–46.0)
HCT: 29 % — ABNORMAL LOW (ref 36.0–46.0)
Hemoglobin: 10.9 g/dL — ABNORMAL LOW (ref 12.0–15.0)
Hemoglobin: 10.2 g/dL — ABNORMAL LOW (ref 12.0–15.0)
Hemoglobin: 10.2 g/dL — ABNORMAL LOW (ref 12.0–15.0)
Hemoglobin: 9.9 g/dL — ABNORMAL LOW (ref 12.0–15.0)
Hemoglobin: 9.9 g/dL — ABNORMAL LOW (ref 12.0–15.0)
Potassium: 4 mmol/L (ref 3.5–5.1)
Potassium: 3.9 mmol/L (ref 3.5–5.1)
Potassium: 3.9 mmol/L (ref 3.5–5.1)
Potassium: 3.7 mmol/L (ref 3.5–5.1)
Potassium: 4.1 mmol/L (ref 3.5–5.1)
Sodium: 141 mmol/L (ref 135–145)
Sodium: 140 mmol/L (ref 135–145)
Sodium: 141 mmol/L (ref 135–145)
Sodium: 141 mmol/L (ref 135–145)
Sodium: 138 mmol/L (ref 135–145)
TCO2: 28 mmol/L (ref 0–100)
TCO2: 27 mmol/L (ref 0–100)
TCO2: 27 mmol/L (ref 0–100)
TCO2: 23 mmol/L (ref 0–100)
TCO2: 22 mmol/L (ref 0–100)

## 2015-11-27 LAB — PROTIME-INR
INR: 1.65 — ABNORMAL HIGH (ref 0.00–1.49)
Prothrombin Time: 19.5 seconds — ABNORMAL HIGH (ref 11.6–15.2)

## 2015-11-27 LAB — POCT I-STAT 3, ART BLOOD GAS (G3+)
Acid-base deficit: 2 mmol/L (ref 0.0–2.0)
Bicarbonate: 22.7 mEq/L (ref 20.0–24.0)
O2 Saturation: 98 %
Patient temperature: 98
TCO2: 24 mmol/L (ref 0–100)
pCO2 arterial: 38.7 mmHg (ref 35.0–45.0)
pH, Arterial: 7.375 (ref 7.350–7.450)
pO2, Arterial: 107 mmHg — ABNORMAL HIGH (ref 80.0–100.0)

## 2015-11-27 LAB — HEMOGLOBIN A1C
Hgb A1c MFr Bld: 6.8 % — ABNORMAL HIGH (ref 4.8–5.6)
Mean Plasma Glucose: 148 mg/dL

## 2015-11-27 LAB — APTT: aPTT: 32 seconds (ref 24–37)

## 2015-11-27 LAB — PREPARE RBC (CROSSMATCH)

## 2015-11-27 LAB — MAGNESIUM: Magnesium: 2.4 mg/dL (ref 1.7–2.4)

## 2015-11-27 IMAGING — CR DG CHEST 1V PORT
1 series · 1 of 1 positions shown · non-contrast
Comparison: [DATE]

CLINICAL DATA: Postop TAVR

EXAM:
PORTABLE CHEST 1 VIEW

[AP]
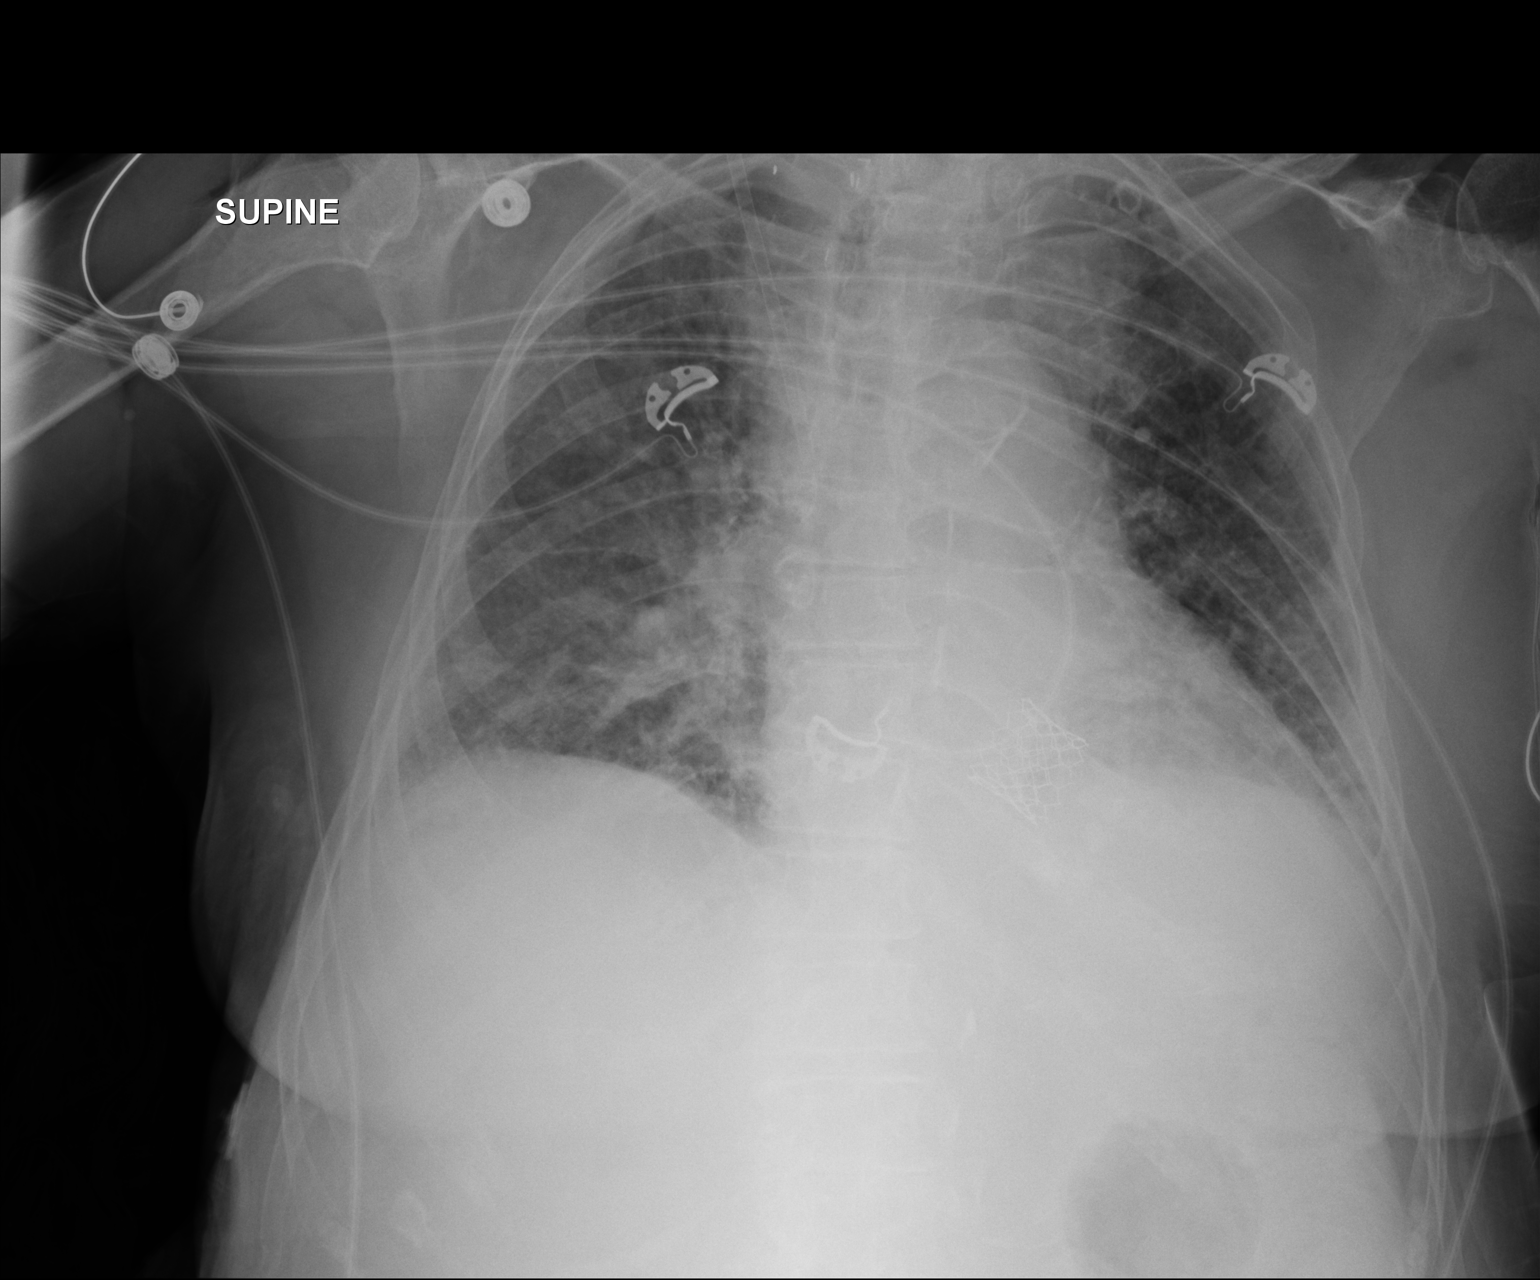

[1 of 1 positions shown; findings below may reference images not displayed]

FINDINGS: Normal heart size. Aortic atherosclerosis noted. Aortic valve
prosthesis is noted. There is mild to moderate pulmonary edema,
increased from previous exam.
IMPRESSION: 1. Pulmonary edema.

## 2015-11-27 SURGERY — IMPLANTATION, AORTIC VALVE, TRANSCATHETER, FEMORAL APPROACH
Anesthesia: General | Site: Chest

## 2015-11-27 MED ORDER — SODIUM CHLORIDE 0.9 % IV SOLN
INTRAVENOUS | Status: DC | PRN
  Administered 2015-11-27: 11:00:00

## 2015-11-27 MED ORDER — INSULIN REGULAR BOLUS VIA INFUSION
0.0000 [IU] | Freq: Three times a day (TID) | INTRAVENOUS | Status: DC
  Filled 2015-11-27: qty 10

## 2015-11-27 MED ORDER — PHENYLEPHRINE HCL 10 MG/ML IJ SOLN
0.0000 ug/min | INTRAVENOUS | Status: DC
  Filled 2015-11-27: qty 2

## 2015-11-27 MED ORDER — SODIUM CHLORIDE 0.9 % IV SOLN: 250.0000 mL | INTRAVENOUS | Status: DC | PRN

## 2015-11-27 MED ORDER — LABETALOL HCL 5 MG/ML IV SOLN
INTRAVENOUS | Status: AC
  Filled 2015-11-27: qty 4

## 2015-11-27 MED ORDER — SUCCINYLCHOLINE CHLORIDE 20 MG/ML IJ SOLN
INTRAMUSCULAR | Status: DC | PRN
  Administered 2015-11-27: 80 mg via INTRAVENOUS

## 2015-11-27 MED ORDER — FAMOTIDINE IN NACL 20-0.9 MG/50ML-% IV SOLN
20.0000 mg | Freq: Two times a day (BID) | INTRAVENOUS | Status: DC
  Administered 2015-11-27: 20 mg via INTRAVENOUS
  Filled 2015-11-27: qty 50

## 2015-11-27 MED ORDER — LABETALOL HCL 5 MG/ML IV SOLN
INTRAVENOUS | Status: DC | PRN
  Administered 2015-11-27: 10 mg via INTRAVENOUS

## 2015-11-27 MED ORDER — SODIUM CHLORIDE 0.9 % IV SOLN
1.0000 mL/kg/h | INTRAVENOUS | Status: DC
  Administered 2015-11-27: 1 mL/kg/h via INTRAVENOUS

## 2015-11-27 MED ORDER — PROTAMINE SULFATE 10 MG/ML IV SOLN
INTRAVENOUS | Status: DC | PRN
  Administered 2015-11-27: 10 mg via INTRAVENOUS
  Administered 2015-11-27: 50 mg via INTRAVENOUS

## 2015-11-27 MED ORDER — SODIUM CHLORIDE 0.9% FLUSH
3.0000 mL | Freq: Two times a day (BID) | INTRAVENOUS | Status: DC
  Administered 2015-11-28: 3 mL via INTRAVENOUS

## 2015-11-27 MED ORDER — PROPOFOL 10 MG/ML IV BOLUS
INTRAVENOUS | Status: AC
  Filled 2015-11-27: qty 20

## 2015-11-27 MED ORDER — ACETAMINOPHEN 650 MG RE SUPP: 650.0000 mg | Freq: Once | RECTAL | Status: AC

## 2015-11-27 MED ORDER — IODIXANOL 320 MG/ML IV SOLN
INTRAVENOUS | Status: DC | PRN
  Administered 2015-11-27: 116 mL via INTRAVENOUS

## 2015-11-27 MED ORDER — CEFUROXIME SODIUM 1.5 G IJ SOLR
1.5000 g | Freq: Two times a day (BID) | INTRAMUSCULAR | Status: AC
  Administered 2015-11-27 – 2015-11-29 (×4): 1.5 g via INTRAVENOUS
  Filled 2015-11-27 (×4): qty 1.5

## 2015-11-27 MED ORDER — CHLORHEXIDINE GLUCONATE 4 % EX LIQD: 60.0000 mL | Freq: Once | CUTANEOUS | Status: DC

## 2015-11-27 MED ORDER — INSULIN ASPART 100 UNIT/ML ~~LOC~~ SOLN
0.0000 [IU] | SUBCUTANEOUS | Status: DC
  Administered 2015-11-27: 4 [IU] via SUBCUTANEOUS
  Administered 2015-11-27 – 2015-11-28 (×2): 2 [IU] via SUBCUTANEOUS
  Administered 2015-11-28: 4 [IU] via SUBCUTANEOUS
  Administered 2015-11-28: 2 [IU] via SUBCUTANEOUS

## 2015-11-27 MED ORDER — ROCURONIUM BROMIDE 50 MG/5ML IV SOLN
INTRAVENOUS | Status: AC
  Filled 2015-11-27: qty 1

## 2015-11-27 MED ORDER — PANTOPRAZOLE SODIUM 40 MG PO TBEC
40.0000 mg | DELAYED_RELEASE_TABLET | Freq: Every day | ORAL | Status: DC
  Administered 2015-11-29 – 2015-11-30 (×2): 40 mg via ORAL
  Filled 2015-11-27 (×2): qty 1

## 2015-11-27 MED ORDER — 0.9 % SODIUM CHLORIDE (POUR BTL) OPTIME
TOPICAL | Status: DC | PRN
  Administered 2015-11-27: 5000 mL

## 2015-11-27 MED ORDER — NITROGLYCERIN IN D5W 200-5 MCG/ML-% IV SOLN
0.0000 ug/min | INTRAVENOUS | Status: DC
  Filled 2015-11-27: qty 250

## 2015-11-27 MED ORDER — LACTATED RINGERS IV SOLN
INTRAVENOUS | Status: DC
  Administered 2015-11-27 (×2): via INTRAVENOUS

## 2015-11-27 MED ORDER — PRAVASTATIN SODIUM 20 MG PO TABS
10.0000 mg | ORAL_TABLET | Freq: Every day | ORAL | Status: DC
  Administered 2015-11-28 – 2015-11-29 (×2): 10 mg via ORAL
  Filled 2015-11-27 (×2): qty 1

## 2015-11-27 MED ORDER — CHLORHEXIDINE GLUCONATE 0.12 % MT SOLN
15.0000 mL | Freq: Once | OROMUCOSAL | Status: AC
  Administered 2015-11-27: 15 mL via OROMUCOSAL
  Filled 2015-11-27: qty 15

## 2015-11-27 MED ORDER — LIDOCAINE HCL (CARDIAC) 20 MG/ML IV SOLN
INTRAVENOUS | Status: AC
  Filled 2015-11-27: qty 5

## 2015-11-27 MED ORDER — LACTATED RINGERS IV SOLN: 500.0000 mL | Freq: Once | INTRAVENOUS | Status: DC | PRN

## 2015-11-27 MED ORDER — ROCURONIUM BROMIDE 100 MG/10ML IV SOLN
INTRAVENOUS | Status: DC | PRN
  Administered 2015-11-27: 40 mg via INTRAVENOUS

## 2015-11-27 MED ORDER — METOPROLOL TARTRATE 5 MG/5ML IV SOLN: 2.5000 mg | INTRAVENOUS | Status: DC | PRN

## 2015-11-27 MED ORDER — ASPIRIN EC 325 MG PO TBEC
325.0000 mg | DELAYED_RELEASE_TABLET | Freq: Every day | ORAL | Status: DC
  Administered 2015-11-28: 325 mg via ORAL
  Filled 2015-11-27: qty 1

## 2015-11-27 MED ORDER — REMIFENTANIL HCL 1 MG IV SOLR
INTRAVENOUS | Status: DC | PRN
  Administered 2015-11-27: .5 ug/kg/min via INTRAVENOUS

## 2015-11-27 MED ORDER — MAGNESIUM SULFATE 4 GM/100ML IV SOLN
4.0000 g | Freq: Once | INTRAVENOUS | Status: AC
  Administered 2015-11-27: 4 g via INTRAVENOUS
  Filled 2015-11-27: qty 100

## 2015-11-27 MED ORDER — VANCOMYCIN HCL IN DEXTROSE 1-5 GM/200ML-% IV SOLN
1000.0000 mg | Freq: Once | INTRAVENOUS | Status: AC
  Administered 2015-11-27: 1000 mg via INTRAVENOUS
  Filled 2015-11-27: qty 200

## 2015-11-27 MED ORDER — ALBUMIN HUMAN 5 % IV SOLN: 250.0000 mL | INTRAVENOUS | Status: DC | PRN

## 2015-11-27 MED ORDER — SODIUM CHLORIDE 0.9 % IV SOLN: Freq: Once | INTRAVENOUS | Status: DC

## 2015-11-27 MED ORDER — ACETAMINOPHEN 500 MG PO TABS
1000.0000 mg | ORAL_TABLET | Freq: Four times a day (QID) | ORAL | Status: DC
  Filled 2015-11-27: qty 2

## 2015-11-27 MED ORDER — MIDAZOLAM HCL 2 MG/2ML IJ SOLN
INTRAMUSCULAR | Status: AC
  Filled 2015-11-27: qty 2

## 2015-11-27 MED ORDER — MIDAZOLAM HCL 2 MG/2ML IJ SOLN: 2.0000 mg | INTRAMUSCULAR | Status: DC | PRN

## 2015-11-27 MED ORDER — ONDANSETRON HCL 4 MG/2ML IJ SOLN: 4.0000 mg | Freq: Four times a day (QID) | INTRAMUSCULAR | Status: DC | PRN

## 2015-11-27 MED ORDER — LIDOCAINE HCL (CARDIAC) 20 MG/ML IV SOLN
INTRAVENOUS | Status: DC | PRN
  Administered 2015-11-27: 60 mg via INTRAVENOUS

## 2015-11-27 MED ORDER — ACETAMINOPHEN 160 MG/5ML PO SOLN: 650.0000 mg | Freq: Once | ORAL | Status: AC

## 2015-11-27 MED ORDER — HEPARIN SODIUM (PORCINE) 1000 UNIT/ML IJ SOLN
INTRAMUSCULAR | Status: DC | PRN
  Administered 2015-11-27: 6000 [IU] via INTRAVENOUS

## 2015-11-27 MED ORDER — MORPHINE SULFATE (PF) 2 MG/ML IV SOLN: 2.0000 mg | INTRAVENOUS | Status: DC | PRN

## 2015-11-27 MED ORDER — NOREPINEPHRINE BITARTRATE 1 MG/ML IV SOLN
4000.0000 ug | INTRAVENOUS | Status: DC | PRN
  Administered 2015-11-27: 1 ug/min via INTRAVENOUS

## 2015-11-27 MED ORDER — POTASSIUM CHLORIDE 10 MEQ/50ML IV SOLN
10.0000 meq | INTRAVENOUS | Status: AC
  Administered 2015-11-27 (×3): 10 meq via INTRAVENOUS
  Filled 2015-11-27 (×3): qty 50

## 2015-11-27 MED ORDER — FENTANYL CITRATE (PF) 100 MCG/2ML IJ SOLN
INTRAMUSCULAR | Status: DC | PRN
  Administered 2015-11-27: 50 ug via INTRAVENOUS

## 2015-11-27 MED ORDER — FENTANYL CITRATE (PF) 250 MCG/5ML IJ SOLN
INTRAMUSCULAR | Status: AC
  Filled 2015-11-27: qty 5

## 2015-11-27 MED ORDER — TRAMADOL HCL 50 MG PO TABS: 50.0000 mg | ORAL_TABLET | ORAL | Status: DC | PRN

## 2015-11-27 MED ORDER — DEXMEDETOMIDINE HCL IN NACL 400 MCG/100ML IV SOLN
INTRAVENOUS | Status: DC | PRN
  Administered 2015-11-27: .5 ug/kg/h via INTRAVENOUS

## 2015-11-27 MED ORDER — SODIUM CHLORIDE 0.9% FLUSH: 3.0000 mL | INTRAVENOUS | Status: DC | PRN

## 2015-11-27 MED ORDER — CHLORHEXIDINE GLUCONATE 4 % EX LIQD: 30.0000 mL | CUTANEOUS | Status: DC

## 2015-11-27 MED ORDER — FENTANYL CITRATE (PF) 100 MCG/2ML IJ SOLN
INTRAMUSCULAR | Status: AC
  Filled 2015-11-27: qty 2

## 2015-11-27 MED ORDER — SODIUM CHLORIDE 0.9 % IV SOLN
INTRAVENOUS | Status: DC
  Filled 2015-11-27: qty 2.5

## 2015-11-27 MED ORDER — ACETAMINOPHEN 160 MG/5ML PO SOLN: 1000.0000 mg | Freq: Four times a day (QID) | ORAL | Status: DC

## 2015-11-27 MED ORDER — ONDANSETRON HCL 4 MG/2ML IJ SOLN
INTRAMUSCULAR | Status: AC
Start: 2015-11-27 — End: 2015-11-27
  Filled 2015-11-27: qty 2

## 2015-11-27 MED ORDER — PROPOFOL 10 MG/ML IV BOLUS
INTRAVENOUS | Status: DC | PRN
Start: 2015-11-27 — End: 2015-11-27
  Administered 2015-11-27: 40 mg via INTRAVENOUS

## 2015-11-27 MED ORDER — METHIMAZOLE 5 MG PO TABS
7.5000 mg | ORAL_TABLET | Freq: Every day | ORAL | Status: DC
  Administered 2015-11-28 – 2015-11-30 (×3): 7.5 mg via ORAL
  Filled 2015-11-27 (×3): qty 2

## 2015-11-27 MED ORDER — ASPIRIN 81 MG PO CHEW: 324.0000 mg | CHEWABLE_TABLET | Freq: Every day | ORAL | Status: DC

## 2015-11-27 MED ORDER — CHLORHEXIDINE GLUCONATE 0.12 % MT SOLN: 15.0000 mL | OROMUCOSAL | Status: DC

## 2015-11-27 MED FILL — Heparin Sodium (Porcine) Inj 1000 Unit/ML: INTRAMUSCULAR | Qty: 30 | Status: AC

## 2015-11-27 MED FILL — Potassium Chloride Inj 2 mEq/ML: INTRAVENOUS | Qty: 40 | Status: AC

## 2015-11-27 MED FILL — Magnesium Sulfate Inj 50%: INTRAMUSCULAR | Qty: 10 | Status: AC

## 2015-11-27 SURGICAL SUPPLY — 104 items
ADAPTER UNIV SWAN GANZ BIP (ADAPTER) ×2 IMPLANT
ADAPTER UNV SWAN GANZ BIP (ADAPTER) ×1
ADH SKN CLS APL DERMABOND .7 (GAUZE/BANDAGES/DRESSINGS) ×4
ADPR CATH UNV NS SG CATH (ADAPTER) ×2
ATTRACTOMAT 16X20 MAGNETIC DRP (DRAPES) IMPLANT
BAG BANDED W/RUBBER/TAPE 36X54 (MISCELLANEOUS) ×3 IMPLANT
BAG DECANTER FOR FLEXI CONT (MISCELLANEOUS) IMPLANT
BAG EQP BAND 135X91 W/RBR TAPE (MISCELLANEOUS) ×2
BAG SNAP BAND KOVER 36X36 (MISCELLANEOUS) ×6 IMPLANT
BLADE 10 SAFETY STRL DISP (BLADE) ×3 IMPLANT
BLADE STERNUM SYSTEM 6 (BLADE) ×3 IMPLANT
BLADE SURG ROTATE 9660 (MISCELLANEOUS) IMPLANT
CABLE PACING FASLOC BIEGE (MISCELLANEOUS) ×3 IMPLANT
CABLE PACING FASLOC BLUE (MISCELLANEOUS) ×3 IMPLANT
CANISTER SUCTION 2500CC (MISCELLANEOUS) IMPLANT
CANNULA FEM VENOUS REMOTE 22FR (CANNULA) IMPLANT
CANNULA OPTISITE PERFUSION 16F (CANNULA) IMPLANT
CANNULA OPTISITE PERFUSION 18F (CANNULA) IMPLANT
CATH DIAG EXPO 6F VENT PIG 145 (CATHETERS) ×6 IMPLANT
CATH EXPO 5FR AL1 (CATHETERS) ×1 IMPLANT
CATH S G BIP PACING (SET/KITS/TRAYS/PACK) ×6 IMPLANT
CLIP TI MEDIUM 24 (CLIP) ×3 IMPLANT
CLIP TI WIDE RED SMALL 24 (CLIP) ×3 IMPLANT
COVER DOME SNAP 22 D (MISCELLANEOUS) ×3 IMPLANT
COVER MAYO STAND STRL (DRAPES) ×3 IMPLANT
COVER TABLE BACK 60X90 (DRAPES) ×3 IMPLANT
CRADLE DONUT ADULT HEAD (MISCELLANEOUS) ×3 IMPLANT
DERMABOND ADVANCED (GAUZE/BANDAGES/DRESSINGS) ×2
DERMABOND ADVANCED .7 DNX12 (GAUZE/BANDAGES/DRESSINGS) ×4 IMPLANT
DEVICE CLOSURE PERCLS PRGLD 6F (VASCULAR PRODUCTS) ×4 IMPLANT
DRAPE INCISE IOBAN 66X45 STRL (DRAPES) IMPLANT
DRAPE SLUSH MACHINE 52X66 (DRAPES) ×3 IMPLANT
DRAPE TABLE COVER HEAVY DUTY (DRAPES) ×3 IMPLANT
DRSG TEGADERM 4X4.75 (GAUZE/BANDAGES/DRESSINGS) ×3 IMPLANT
ELECT REM PT RETURN 9FT ADLT (ELECTROSURGICAL) ×6
ELECTRODE REM PT RTRN 9FT ADLT (ELECTROSURGICAL) ×4 IMPLANT
FELT TEFLON 1X6 (MISCELLANEOUS) ×1 IMPLANT
FELT TEFLON 6X6 (MISCELLANEOUS) ×3 IMPLANT
FEMORAL VENOUS CANN RAP (CANNULA) IMPLANT
GAUZE SPONGE 4X4 12PLY STRL (GAUZE/BANDAGES/DRESSINGS) ×3 IMPLANT
GLOVE BIO SURGEON STRL SZ7.5 (GLOVE) ×3 IMPLANT
GLOVE BIO SURGEON STRL SZ8 (GLOVE) ×6 IMPLANT
GLOVE EUDERMIC 7 POWDERFREE (GLOVE) ×3 IMPLANT
GLOVE ORTHO TXT STRL SZ7.5 (GLOVE) ×3 IMPLANT
GOWN STRL REUS W/ TWL LRG LVL3 (GOWN DISPOSABLE) ×6 IMPLANT
GOWN STRL REUS W/ TWL XL LVL3 (GOWN DISPOSABLE) ×12 IMPLANT
GOWN STRL REUS W/TWL LRG LVL3 (GOWN DISPOSABLE) ×9
GOWN STRL REUS W/TWL XL LVL3 (GOWN DISPOSABLE) ×18
GUIDEWIRE SAF TJ AMPL .035X180 (WIRE) ×3 IMPLANT
GUIDEWIRE SAFE TJ AMPLATZ EXST (WIRE) ×3 IMPLANT
GUIDEWIRE STRAIGHT .035 260CM (WIRE) ×3 IMPLANT
GUIDEWIRE WHOLEY .035 145 JTIP (WIRE) ×1 IMPLANT
INSERT FOGARTY 61MM (MISCELLANEOUS) ×3 IMPLANT
INSERT FOGARTY SM (MISCELLANEOUS) ×6 IMPLANT
INSERT FOGARTY XLG (MISCELLANEOUS) IMPLANT
KIT BASIN OR (CUSTOM PROCEDURE TRAY) ×3 IMPLANT
KIT DILATOR VASC 18G NDL (KITS) IMPLANT
KIT HEART LEFT (KITS) ×3 IMPLANT
KIT ROOM TURNOVER OR (KITS) ×3 IMPLANT
KIT SUCTION CATH 14FR (SUCTIONS) ×6 IMPLANT
NDL PERC 18GX7CM (NEEDLE) ×1 IMPLANT
NEEDLE PERC 18GX7CM (NEEDLE) ×3 IMPLANT
NS IRRIG 1000ML POUR BTL (IV SOLUTION) ×9 IMPLANT
PACK AORTA (CUSTOM PROCEDURE TRAY) ×3 IMPLANT
PAD ARMBOARD 7.5X6 YLW CONV (MISCELLANEOUS) ×6 IMPLANT
PAD ELECT DEFIB RADIOL ZOLL (MISCELLANEOUS) ×3 IMPLANT
PATCH TACHOSII LRG 9.5X4.8 (VASCULAR PRODUCTS) IMPLANT
PERCLOSE PROGLIDE 6F (VASCULAR PRODUCTS) ×6
SET MICROPUNCTURE 5F STIFF (MISCELLANEOUS) ×3 IMPLANT
SHEATH AVANTI 11CM 8FR (MISCELLANEOUS) ×3 IMPLANT
SHEATH PINNACLE 6F 10CM (SHEATH) ×7 IMPLANT
SLEEVE REPOSITIONING LENGTH 30 (MISCELLANEOUS) ×3 IMPLANT
SPONGE GAUZE 4X4 12PLY STER LF (GAUZE/BANDAGES/DRESSINGS) ×1 IMPLANT
SPONGE LAP 4X18 X RAY DECT (DISPOSABLE) ×3 IMPLANT
STOPCOCK MORSE 400PSI 3WAY (MISCELLANEOUS) ×9 IMPLANT
SUT ETHIBOND X763 2 0 SH 1 (SUTURE) ×3 IMPLANT
SUT GORETEX CV 4 TH 22 36 (SUTURE) ×3 IMPLANT
SUT GORETEX CV4 TH-18 (SUTURE) ×9 IMPLANT
SUT GORETEX TH-18 36 INCH (SUTURE) ×6 IMPLANT
SUT MNCRL AB 3-0 PS2 18 (SUTURE) ×3 IMPLANT
SUT PROLENE 3 0 SH1 36 (SUTURE) IMPLANT
SUT PROLENE 4 0 RB 1 (SUTURE) ×3
SUT PROLENE 4-0 RB1 .5 CRCL 36 (SUTURE) ×2 IMPLANT
SUT PROLENE 5 0 C 1 36 (SUTURE) ×6 IMPLANT
SUT PROLENE 6 0 C 1 30 (SUTURE) ×6 IMPLANT
SUT SILK  1 MH (SUTURE) ×1
SUT SILK 1 MH (SUTURE) ×2 IMPLANT
SUT SILK 2 0 SH CR/8 (SUTURE) IMPLANT
SUT VIC AB 2-0 CT1 27 (SUTURE) ×3
SUT VIC AB 2-0 CT1 TAPERPNT 27 (SUTURE) ×2 IMPLANT
SUT VIC AB 2-0 CTX 36 (SUTURE) IMPLANT
SUT VIC AB 3-0 SH 8-18 (SUTURE) ×6 IMPLANT
SYR 30ML LL (SYRINGE) ×6 IMPLANT
SYR 50ML LL SCALE MARK (SYRINGE) ×3 IMPLANT
SYRINGE 10CC LL (SYRINGE) ×2 IMPLANT
TAPE CLOTH SURG 4X10 WHT LF (GAUZE/BANDAGES/DRESSINGS) ×1 IMPLANT
TOWEL OR 17X26 10 PK STRL BLUE (TOWEL DISPOSABLE) ×6 IMPLANT
TRANSDUCER W/STOPCOCK (MISCELLANEOUS) ×6 IMPLANT
TRAY FOLEY IC TEMP SENS 14FR (CATHETERS) ×3 IMPLANT
TUBE SUCT INTRACARD DLP 20F (MISCELLANEOUS) IMPLANT
TUBING HIGH PRESSURE 120CM (CONNECTOR) ×3 IMPLANT
VALVE HEART TRANSCATH SZ3 23MM (Prosthesis & Implant Heart) ×1 IMPLANT
WIRE AMPLATZ SS-J .035X180CM (WIRE) ×3 IMPLANT
WIRE BENTSON .035X145CM (WIRE) ×3 IMPLANT

## 2015-11-27 NOTE — Progress Notes (Signed)
  Echocardiogram Echocardiogram Transesophageal has been performed.  Alexa Mcdonald 11/27/2015, 1:09 PM

## 2015-11-27 NOTE — Interval H&P Note (Signed)
History and Physical Interval Note:  11/27/2015 10:48 AM  Alexa Mcdonald  has presented today for surgery, with the diagnosis of SEVERE AS  The various methods of treatment have been discussed with the patient and family. After consideration of risks, benefits and other options for treatment, the patient has consented to  Procedure(s): TRANSCATHETER AORTIC VALVE REPLACEMENT, TRANSFEMORAL (N/A) TRANSESOPHAGEAL ECHOCARDIOGRAM (TEE) (N/A) as a surgical intervention .  The patient's history has been reviewed, patient examined, no change in status, stable for surgery.  I have reviewed the patient's chart and labs.  Questions were answered to the patient's satisfaction.    Have reviewed case with Dr [name] and plan percutaneous right transfemoral access with a 23 mm S3 valve under general anesthesia. Questions answered.   [name]

## 2015-11-27 NOTE — Care Management Note (Signed)
Case Management Note  Patient Details  Name: Alexa Mcdonald MRN: [id number] Date of Birth: Aug 04, 1921  Subjective/Objective:       S/p TAVR             Action/Plan:  Pt is independent from home alone, son serves as support system.  CM will continue to monitor for disposition needs   Expected Discharge Date:                  Expected Discharge Plan:  Home/Self Care  In-House Referral:     Discharge planning Services  CM Consult  Post Acute Care Choice:    Choice offered to:     DME Arranged:    DME Agency:     HH Arranged:    HH Agency:     Status of Service:  In process, will continue to follow  Medicare Important Message Given:    Date Medicare IM Given:    Medicare IM give by:    Date Additional Medicare IM Given:    Additional Medicare Important Message give by:     If discussed at [place] of Stay Meetings, dates discussed:    Additional Comments:  Maryclare Labrador, RN 11/27/2015, 3:43 PM

## 2015-11-27 NOTE — H&P (View-Only) (Signed)
Patient ID: Alexa Mcdonald, female   DOB: [birth year], 82 y.o.   MRN: [id number]  [facility] CONSULTATION REPORT  Referring Provider is [name], MD PCP is [name], MD  Chief Complaint  Patient presents with  . Aortic Stenosis    2nd TAVR eval, Surgery sch'ed for 11/27/2015    HPI:  The patient is a 82 year old woman with a history of hypertension, PAF on coumadin, stage 3 chronic kidney disease and severe aortic stenosis with chronic diastolic heart failure who has been followed by Dr. [name] for several years. Echocardiograms have shown the gradual progression of severity of her aortic stenosis. She was referred for surgical consultation in July 2018for severe aortic stenosis but claimed to be relatively asymptomatic at that time and refused further workup. Over the last several months she has developed progressive symptoms of exertional shortness of breath, dizziness, and fatigue. She now gets short of breath with moderate to low-level activities like walking in her house. She has not had any resting shortness of breath, PND, orthopnea, or lower extremity edema. She has never had any chest pain or chest tightness. A repeat echocardiogram reveals severe aortic stenosis with peak velocity across the aortic valve measuring greater than 5 m/s. Mean transvalvular gradient is  74 mmHg. Left ventricular systolic function remains normal with an ejection fraction of 55-60%. The patient does have significant mitral annular calcification and at moderate to severe mitral regurgitation. Cardiac cath showed mild non-obstructive coronary artery disease and severe aortic stenosis with an aortic valve area calculated at  0.8 cm. Pulmonary artery pressures were mildly elevated at 42/20 with mean pulmonary artery wedge pressure of 13.  She is widowed and lives independently in her own home. Her son is with her  today. She is still fairly active for her age but has been limited by shortness of breath and fatigue with low level activity. It seems like her symptoms are progressing to the point where she can't do much without giving out.   Past Medical History  Diagnosis Date  . Mediastinal goiter      recurrent s/p resection 1988  . CKD (chronic kidney disease), stage III   . Osteopenia   . Osteoporosis   . Vitamin D deficiency   . Paroxysmal atrial fibrillation (HCC)     a. CHA2DS2VASc = 5-->coumadin;  b. previously on amio->d/c'd 11/2014 secondary to concern for possible amio lung.  . Essential hypertension   . LBBB (left bundle branch block)   . Severe aortic stenosis   . Hx of orthostatic hypotension   . Colon, diverticulosis   . Tachycardia-bradycardia ([place])   . Diabetes mellitus     diet controlled  . Esophageal reflux   . GERD (gastroesophageal reflux disease)   . Mitral regurgitation   . 1St degree AV block   . Shortness of breath dyspnea     for last 3 weeks - with any activity    Past Surgical History  Procedure Laterality Date  . Cataract extraction Bilateral   . Carpal tunnel release Bilateral   . Mri  08/18/07    head (diabetes heart study at [facility])  . Mediastinal removal of a goiter    . Cesarean section      x3  . Cardiac catheterization N/A 10/31/2015    Procedure: Right/Left Heart Cath and Coronary Angiography;  Surgeon: [name], MD;  Location: [facility];  Service:  Cardiovascular;  Laterality: N/A;    Family History  Problem Relation Age of Onset  . Diabetes Mother   . Cancer - Prostate Father   . Pulmonary embolism Father   . Hypertension Sister   . Diabetes Sister   . Hypertension Brother   . Arrhythmia Brother     afib  . CAD Brother   . Pulmonary fibrosis Brother     Social History   Social History  . Marital Status: Widowed    Spouse Name: N/A  . Number of Children: 3  . Years of Education: N/A   Occupational History  . retire     Social History Main Topics  . Smoking status: Never Smoker   . Smokeless tobacco: Never Used  . Alcohol Use: No  . Drug Use: No  . Sexual Activity: Not on file   Other Topics Concern  . Not on file   Social History Narrative   Lives in [place].  Widowed.    Current Outpatient Prescriptions  Medication Sig Dispense Refill  . acetaminophen (TYLENOL) 500 MG tablet Take 500 mg by mouth every 6 (six) hours as needed for moderate pain.    . Calcium Carb-Cholecalciferol (CALCIUM 600/VITAMIN D3 PO) Take 1 tablet by mouth 2 (two) times daily.    [name] lisinopril (PRINIVIL,ZESTRIL) 20 MG tablet Take 1 tablet (20 mg total) by mouth 2 (two) times daily. 60 tablet 11  . methimazole (TAPAZOLE) 5 MG tablet Take 7.5 mg by mouth daily.     . Multiple Vitamins-Minerals (OCUVITE PRESERVISION PO) Take 1 capsule by mouth daily.     . pravastatin (PRAVACHOL) 10 MG tablet Take 10 mg by mouth at bedtime.     [name] warfarin (COUMADIN) 2.5 MG tablet TAKE TABLETS BY MOUTH AS DIRECTED BY COUMADIN CLINIC (Patient not taking: Reported on 11/26/2015) 60 tablet 3   No current facility-administered medications for this visit.   Facility-Administered Medications Ordered in Other Visits  Medication Dose Route Frequency Provider Last Rate Last Dose  . [START ON 11/27/2015] 0.9 %  sodium chloride infusion   Intravenous Continuous [name], MD      . Derrill Memo ON 11/27/2015] cefUROXime (ZINACEF) 1.5 g in dextrose 5 % 50 mL IVPB  1.5 g Intravenous To OR Rexene Alberts, MD      . Derrill Memo ON 11/27/2015] dexmedetomidine (PRECEDEX) 400 MCG/100ML (4 mcg/mL) infusion  0.1-0.7 mcg/kg/hr Intravenous To OR Rexene Alberts, MD      . Derrill Memo ON 11/27/2015] DOPamine (INTROPIN) 800 mg in dextrose 5 % 250 mL (3.2 mg/mL) infusion  0-10 mcg/kg/min Intravenous To OR Rexene Alberts, MD      . Derrill Memo ON 11/27/2015] EPINEPHrine (ADRENALIN) 4 mg in dextrose 5 % 250 mL (0.016 mg/mL) infusion  0-10 mcg/min Intravenous To OR Rexene Alberts, MD       . Derrill Memo ON 11/27/2015] heparin 30,000 units/NS 1000 mL solution for CELLSAVER   Other To OR Rexene Alberts, MD      . Derrill Memo ON 11/27/2015] insulin regular (NOVOLIN R,HUMULIN R) 250 Units in sodium chloride 0.9 % 250 mL (1 Units/mL) infusion   Intravenous To OR Rexene Alberts, MD      . Derrill Memo ON 11/27/2015] magnesium sulfate (IV Push/IM) injection 40 mEq  40 mEq Other To OR Rexene Alberts, MD      . Derrill Memo ON 11/27/2015] nitroGLYCERIN 50 mg in dextrose 5 % 250 mL (0.2 mg/mL) infusion  2-200 mcg/min Intravenous To OR Rexene Alberts, MD      . [  START ON 11/27/2015] norepinephrine (LEVOPHED) 4 mg in dextrose 5 % 250 mL (0.016 mg/mL) infusion  0-10 mcg/min Intravenous To OR Rexene Alberts, MD      . Derrill Memo ON 11/27/2015] phenylephrine (NEO-SYNEPHRINE) 20 mg in dextrose 5 % 250 mL (0.08 mg/mL) infusion  30-200 mcg/min Intravenous To OR Rexene Alberts, MD      . Derrill Memo ON 11/27/2015] potassium chloride injection 80 mEq  80 mEq Other To OR Rexene Alberts, MD      . Derrill Memo ON 11/27/2015] vancomycin (VANCOCIN) 1,250 mg in sodium chloride 0.9 % 250 mL IVPB  1,250 mg Intravenous To OR Rexene Alberts, MD        Allergies  Allergen Reactions  . Amoxicillin Other (See Comments)    tongue felt bruised.  . Celebrex [Celecoxib] Other (See Comments)    Mouth sores      Review of Systems:  General:marginal appetite, decreased energy, no weight gain, no weight loss, no fever Cardiac:no chest pain with exertion, no chest pain at rest, + SOB with exertion, no resting SOB, no PND, no orthopnea, no palpitations, + arrhythmia, + atrial fibrillation, + LE edema, + dizzy spells, no syncope Respiratory:+ shortness of breath, no home oxygen, no productive cough, no dry cough, no bronchitis, no wheezing, no hemoptysis, no asthma, no pain with inspiration or cough, no sleep apnea, no CPAP at  night GI:no difficulty swallowing, no reflux, no frequent heartburn, no hiatal hernia, no abdominal pain, no constipation, no diarrhea, no hematochezia, no hematemesis, no melena GU:no dysuria, no frequency, no urinary tract infection, no hematuria, no kidney stones, no kidney disease Vascular:no pain suggestive of claudication, no pain in feet, no leg cramps, no varicose veins, no DVT, no non-healing foot ulcer Neuro:no stroke, no TIA's, no seizures, no headaches, notemporary blindness one eye, no slurred speech, no peripheral neuropathy, no chronic pain, mild instability of gait, mild memory/cognitive dysfunction Musculoskeletal:+ arthritis, no joint swelling, no myalgias, mild difficulty walking, slightly limited mobility  Skin:no rash, no itching, no skin infections, no pressure sores or ulcerations Psych:no anxiety, no depression, no nervousness, no unusual recent stress Eyes:no blurry vision, no floaters, no recent vision changes, + wears glasses or contacts ENT:+ hearing loss, no loose or painful teeth, no dentures, last saw dentist last year Hematologic:+ easy bruising, no abnormal bleeding, no clotting disorder, no frequent epistaxis Endocrine:+ diabetes, does check CBG's at home     Physical Exam:   BP 160/80 mmHg  Pulse 80  Resp 20  Ht 5' (1.524 m)  Wt 136 lb (61.689 kg)  BMI 26.56 kg/m2  SpO2 98%  General:  Elderly but  well-appearing  HEENT:  Unremarkable , NCAT, PERLA, EOMI, oropharynx clear  Neck:   no JVD, no bruits, no adenopathy, enlarged thyroid, old collar incision scar  from prior thyroid surgery  Chest:   clear to auscultation, symmetrical breath sounds, no wheezes, no rhonchi   CV:   RRR, grade III/VI crescendo/decrescendo murmur heard best at RSB, no no diastolic murmur  Abdomen:  soft, non-tender, no masses or organomegaly  Extremities:  warm, well-perfused, pulses palpable in feet, mild  LE edema to the knee bilaterally  Rectal/GU  Deferred  Neuro:   Grossly non-focal and symmetrical throughout  Skin:   Clean and dry, no rashes, no breakdown   Diagnostic Tests:  [facility]  [address], [place] [address]  [phone]  ------------------------------------------------------------------- Transthoracic Echocardiography  Patient: Alexa, Mcdonald MR #: [id number]  Study Date: 10/17/2015 Gender: F Age: 19 Height: 153.7 cm Weight: 60.1 kg BSA: 1.62 m^2 Pt. Status: Room:  Meribeth Mattes, MD [place], Chrisman [place], M.D. SONOGRAPHER Cindy Hazy, RDCS PERFORMING [facility], Outpatient  cc:  ------------------------------------------------------------------- LV EF: 55% - 60%  ------------------------------------------------------------------- Indications: I35.9 Aortic Valve Disorder.  ------------------------------------------------------------------- History: PMH: Acquired from the patient and from the patient&'s chart. PMH: Paroxysmal Atrial Fibrillation. Chronic Kidney disease. LBBB. Tachy-Brady Syndrome. Risk factors: Diabetes mellitus.  ------------------------------------------------------------------- Study Conclusions  - Left ventricle: The cavity size was normal. Wall thickness was  increased in a pattern of mild LVH. Systolic function was normal.  The estimated ejection fraction was in the range of 55% to 60%.   Septal-lateral dyssynchrony. Wall motion was normal; there were  no regional wall motion abnormalities. Features are consistent  with a pseudonormal left ventricular filling pattern, with  concomitant abnormal relaxation and increased filling pressure  (grade 2 diastolic dysfunction). E/medial e&' > 15 suggests LV end  diastolic pressure at least 20 mmHg. - Aortic valve: Probably trileaflet; severely calcified leaflets.  There was severe stenosis. There was moderate regurgitation. Mean  gradient (S): 74 mm Hg. Valve area (VTI): 0.51 cm^2. - Mitral valve: Moderately calcified annulus. Mildly calcified  leaflets . There was moderate to severe regurgitation. - Left atrium: The atrium was severely dilated. - Right ventricle: The cavity size was normal. Systolic function  was normal. - Tricuspid valve: Peak RV-RA gradient (S): 62 mm Hg. - Pulmonary arteries: PA peak pressure: 65 mm Hg (S). - Inferior vena cava: The vessel was normal in size. The  respirophasic diameter changes were in the normal range (>= 50%),  consistent with normal central venous pressure.  Impressions:  - Normal LV size with mild LV hypertrophy. EF 55-60% with  septal-lateral dyssynchrony. Normal RV size and systolic  function. Severe aortic stenosis, moderate aortic insufficiency,  moderate to severe mitral regurgitation. Moderate pulmonary  hypertension.  Transthoracic echocardiography. M-mode, complete 2D, spectral Doppler, and color Doppler. Birthdate: Patient birthdate: Feb 04, 1921. Age: Patient is 82 yr old. Sex: Gender: female. BMI: 25.5 kg/m^2. Blood pressure: 134/70 Patient status: Outpatient. Study date: Study date: 10/17/2015. Study time: 01:32 PM. Location: [facility]  -------------------------------------------------------------------  ------------------------------------------------------------------- Left ventricle: The cavity size was normal. Wall  thickness was increased in a pattern of mild LVH. Systolic function was normal. The estimated ejection fraction was in the range of 55% to 60%. Septal-lateral dyssynchrony. Wall motion was normal; there were no regional wall motion abnormalities. Features are consistent with a pseudonormal left ventricular filling pattern, with concomitant abnormal relaxation and increased filling pressure (grade 2 diastolic dysfunction). E/medial e&' > 15 suggests LV end diastolic pressure at least 20 mmHg.  ------------------------------------------------------------------- Aortic valve: Probably trileaflet; severely calcified leaflets. Doppler: There was severe stenosis. There was moderate regurgitation. VTI ratio of LVOT to aortic valve: 0.18. Valve area (VTI): 0.51 cm^2. Indexed valve area (VTI): 0.31 cm^2/m^2. Peak velocity ratio of LVOT to aortic valve: 0.19. Valve area (Vmax): 0.54 cm^2. Indexed valve area (Vmax): 0.33 cm^2/m^2. Mean velocity ratio of LVOT to aortic valve: 0.19. Valve area (Vmean): 0.55 cm^2. Indexed valve area (Vmean): 0.34 cm^2/m^2. Mean gradient (S): 74 mm Hg. Peak gradient (S): 110 mm Hg.  ------------------------------------------------------------------- Aorta: Aortic root: The aortic root was normal in size. Ascending aorta: The ascending aorta was normal in size.  ------------------------------------------------------------------- Mitral valve: Moderately calcified annulus. Mildly calcified leaflets . Doppler: There was no evidence for stenosis. There was moderate to severe  regurgitation. Peak gradient (D): 13 mm Hg.  ------------------------------------------------------------------- Left atrium: The atrium was severely dilated.  ------------------------------------------------------------------- Right ventricle: The cavity size was normal. Systolic function  was normal.  ------------------------------------------------------------------- Pulmonic valve: Structurally normal valve. Cusp separation was normal. Doppler: Transvalvular velocity was within the normal range. There was no regurgitation.  ------------------------------------------------------------------- Tricuspid valve: Doppler: There was mild regurgitation.  ------------------------------------------------------------------- Right atrium: The atrium was normal in size.  ------------------------------------------------------------------- Pericardium: There was no pericardial effusion.  ------------------------------------------------------------------- Systemic veins: Inferior vena cava: The vessel was normal in size. The respirophasic diameter changes were in the normal range (>= 50%), consistent with normal central venous pressure.  ------------------------------------------------------------------- Measurements  Left ventricle Value Reference LV ID, ED, PLAX chordal (L) 38.1 mm 43 - 52 LV ID, ES, PLAX chordal 28.4 mm 23 - 38 LV fx shortening, PLAX chordal (L) 25 % >=29 LV PW thickness, ED 11.9 mm --------- IVS/LV PW ratio, ED 1.14 <=1.3 Stroke volume, 2D 74 ml --------- Stroke volume/bsa, 2D 46 ml/m^2 --------- LV e&', lateral 7.68 cm/s --------- LV E/e&', lateral 23.31 --------- LV e&', medial 5.37 cm/s --------- LV E/e&', medial 33.33 --------- LV e&', average 6.53 cm/s --------- LV E/e&', average 27.43  ---------  Ventricular septum Value Reference IVS thickness, ED 13.6 mm ---------  LVOT Value Reference LVOT ID, S 19 mm --------- LVOT area 2.84 cm^2 --------- LVOT ID 19 mm --------- LVOT peak velocity, S 99.2 cm/s --------- LVOT mean velocity, S 78.7 cm/s --------- LVOT VTI, S 26.2 cm --------- LVOT peak gradient, S 4 mm Hg --------- Stroke volume (SV), LVOT DP 74.3 ml --------- Stroke index (SV/bsa), LVOT DP 45.9 ml/m^2 ---------  Aortic valve Value Reference Aortic valve peak velocity, S 524 cm/s --------- Aortic valve mean velocity, S 407 cm/s --------- Aortic valve VTI, S 147 cm --------- Aortic mean gradient, S 74 mm Hg --------- Aortic peak gradient, S 110 mm Hg --------- VTI ratio, LVOT/AV 0.18 --------- Aortic valve area, VTI 0.51 cm^2 --------- Aortic valve area/bsa, VTI 0.31 cm^2/m^2 --------- Velocity ratio, peak, LVOT/AV 0.19 --------- Aortic valve area, peak velocity 0.54 cm^2 --------- Aortic valve area/bsa, peak 0.33 cm^2/m^2 --------- velocity Velocity ratio, mean, LVOT/AV 0.19 --------- Aortic valve area, mean velocity 0.55 cm^2 --------- Aortic valve area/bsa, mean 0.34 cm^2/m^2 --------- velocity Aortic regurg pressure  half-time 391 ms ---------  Aorta Value Reference Aortic root ID, ED 30 mm ---------  Left atrium Value Reference LA ID, A-P, ES 44 mm --------- LA ID/bsa, A-P (H) 2.72 cm/m^2 <=2.2 LA volume, S 126 ml --------- LA volume/bsa, S 77.9 ml/m^2 --------- LA volume, ES, 1-p A4C 99 ml --------- LA volume/bsa, ES, 1-p A4C 61.2 ml/m^2 --------- LA volume, ES, 1-p A2C 146 ml --------- LA volume/bsa, ES, 1-p A2C 90.3 ml/m^2 ---------  Mitral valve Value Reference Mitral E-wave peak velocity 179 cm/s --------- Mitral A-wave peak velocity 132 cm/s --------- Mitral deceleration time 183 ms 150 - 230 Mitral peak gradient, D 13 mm Hg --------- Mitral E/A ratio, peak 1.4 --------- Mitral regurg VTI, PISA 259 cm ---------  Pulmonary arteries Value Reference PA pressure, S, DP (H) 65 mm Hg <=30  Tricuspid valve Value Reference Tricuspid peak RV-RA gradient 61 mm Hg ---------  Right ventricle Value Reference RV s&', lateral, S 10.9 cm/s ---------  Legend: (L) and (H) mark values outside specified reference range.  ------------------------------------------------------------------- Prepared and Electronically Authenticated by  Loralie Champagne,  M.D. 2017-03-15T22:39:46     [name], MD (Primary)      Procedures    Right/Left  Heart Cath and Coronary Angiography    Conclusion     Ost LM lesion, 40% stenosed.  Mid Cx lesion, 25% stenosed.  2nd Diag lesion, 40% stenosed.  Dist LAD lesion, 25% stenosed.  1. Severe aortic stenosis with calculated aortic valve area 0.8 cm 2. Mild diffuse nonobstructive coronary artery disease without significant flow-limiting stenosis 3. Well compensated intracardiac hemodynamics with pulmonary wedge pressure of 13, LVEDP 11 and mild pulmonary hypertension with a mean PA pressure 27     Indications    Severe aortic stenosis [I35.0 (ICD-10-CM)]    Technique and Indications    INDICATION: Severe symptomatic aortic stenosis  PROCEDURAL DETAILS: The right groin was prepped, draped, and anesthetized with 1% lidocaine. Using the modified Seldinger technique a 4 French sheath was placed in the right femoral artery and a 7 French sheath was placed in the right femoral vein. A Swan-Ganz catheter was used for the right heart catheterization. Standard protocol was followed for recording of right heart pressures and sampling of oxygen saturations. Fick cardiac output was calculated. Standard Judkins catheters were used for selective coronary angiography and left ventriculography. There were no immediate procedural complications. The patient was transferred to the post catheterization recovery area for further monitoring.  During this procedure the patient is administered a total of Versed 1 mg and Fentanyl 25 mg to achieve and maintain moderate conscious sedation. The patient's heart rate, blood pressure, and oxygen saturation are monitored continuously during the procedure. The period of conscious sedation is 32 minutes, of which I was present face-to-face 100% of this time. Estimated blood loss <50 mL. There were no immediate complications during the procedure.    Coronary Findings     Dominance: Right   Left Main   . Ost LM lesion, 40% stenosed. The left main has calcification at the origin. There is an eccentric 30-40% stenosis. There is no pressure dampening with the catheter beyond the lesion.     Left Anterior Descending   . Dist LAD lesion, 25% stenosed. Mild diffuse mid LAD stenosis is present.   . Second Diagonal Branch   . 2nd Diag lesion, 40% stenosed. The second diagonal is large in caliber. The vessel is larger than the mid and distal LAD. There is mild nonobstructive stenosis present.     Left Circumflex   . Mid Cx lesion, 25% stenosed. Calcified diffuse.     Right Coronary Artery  . Vessel is large. This is a large, dominant vessel. The vessel supplies a large PLA and a large PDA. There is mild diffuse nonobstructive plaquing present but no significant stenosis is identified throughout the distribution of the right coronary artery.       Right Heart Pressures Hemodynamic findings consistent with aortic stenosis.    Left Heart    Mitral Valve The annulus is calcified. The mitral annulus is heavily calcified.   Aortic Valve There is severe aortic valve stenosis. The aortic valve is calcified. There is restricted aortic valve motion. There is severe aortic stenosis. The peak to peak gradient is 47 mmHg, mean gradient 32 mmHg, calculated aortic valve area 0.79 cm   Aorta Aortic root injections are performed. There is moderate aortic insufficiency present. The aortic leaflets are calcified and restricted. The proximal ascending aorta appears normal in size.    Coronary Diagrams    Diagnostic Diagram            Implants     No implant documentation for this case.    PACS Images  Show images for Cardiac catheterization     Link to Procedure Log    Procedure Log      Hemo Data       Most Recent Value   Fick Cardiac Output  4.09 L/min   Fick Cardiac Output Index  2.59 (L/min)/BSA   Aortic Mean Gradient  31.7  mmHg   Aortic Peak Gradient  47 mmHg   Aortic Valve Area  0.79   Aortic Value Area Index  0.5 cm2/BSA   RA A Wave  -99 mmHg   RA V Wave  14 mmHg   RA Mean  10 mmHg   RV Systolic Pressure  38 mmHg   RV Diastolic Pressure  7 mmHg   RV EDP  10 mmHg   PA Systolic Pressure  42 mmHg   PA Diastolic Pressure  20 mmHg   PA Mean  27 mmHg   PW A Wave  -99 mmHg   PW V Wave  15 mmHg   PW Mean  13 mmHg   AO Systolic Pressure  [id number] mmHg   AO Diastolic Pressure  54 mmHg   AO Mean  75 mmHg   LV Systolic Pressure  [id number] mmHg   LV Diastolic Pressure  8 mmHg   LV EDP  11 mmHg   Arterial Occlusion Pressure Extended Systolic Pressure  [id number] mmHg   Arterial Occlusion Pressure Extended Diastolic Pressure  50 mmHg   Arterial Occlusion Pressure Extended Mean Pressure  70 mmHg   Left Ventricular Apex Extended Systolic Pressure  0000000 mmHg   Left Ventricular Apex Extended Diastolic Pressure  7 mmHg   Left Ventricular Apex Extended EDP Pressure  11 mmHg   QP/QS  1   TPVR Index  10.44 HRUI   TSVR Index  29.01 HRUI   PVR SVR Ratio  0.2   TPVR/TSVR Ratio  0.36    ADDENDUM REPORT: 10/25/2015 14:19 CLINICAL DATA: Severe aortic stenosis EXAM: Cardiac TAVR CT TECHNIQUE: The patient was scanned on a Philips 256 scanner. A 120 kV retrospective scan was triggered in the descending thoracic aorta at 111 HU's. Gantry rotation speed was 270 msecs and collimation was .9 mm. 5 mg of iv metoprolol and no nitro were given. The 3D data set was reconstructed in 5% intervals of the R-R cycle. Systolic and diastolic phases were analyzed on a dedicated work station using MPR, MIP and VRT modes. The patient received 80 cc of contrast. FINDINGS: Aortic Valve: Trileaflet, severely thickened and calcified with severely restricted leaflet opening. Only mild calcifications present in the subvalvular apparatus in the LVOT. Aorta: Normal size, no dissection. Moderate diffuse calcifications in the  aortic arch and descending thoracic aorta. Sinotubular Junction: 26 x 25 mm Ascending Thoracic Aorta: 33 x 31 mm Aortic Arch: 27 x 26 mm Descending Thoracic Aorta: 28 x 25 mm Sinus of Valsalva Measurements: Non-coronary: 30 mm Right -coronary: 28 mm Left -coronary: 30 mm Coronary Artery Height above Annulus: Left Main: 9.6 mm Right Coronary: 17 mm Virtual Basal Annulus Measurements: Maximum/Minimum Diameter: 27 x 20 mm Perimeter: 83 mm Area: 408 mm2 Optimum Fluoroscopic Angle for Delivery: LAO 1 CAU 1 Pulmonary artery measures 39 x 36 mm suggestive of pulmonary hypertension. IMPRESSION: 1. Trileaflet, severely thickened and calcified with severely restricted leaflet opening with annular measurements suitable for delivery of 23 mm Edward-SAPIEN 3 valve. 2. Sufficient coronary to annulus distance. 3. Optimum Fluoroscopic Angle for Delivery: LAO 1 CAU 1. 4. Dilated pulmonary artery suggestive of pulmonary hypertension. Ena Dawley  Electronically Signed  By: Ena Dawley  On: 10/25/2015 14:19   CLINICAL DATA: 82 year old female with history of severe aortic stenosis. Preprocedural study prior to potential transcatheter aortic valve replacement (TAVR) procedure.  EXAM: CT ANGIOGRAPHY CHEST, ABDOMEN AND PELVIS  TECHNIQUE: Multidetector CT imaging through the chest, abdomen and pelvis was performed using the standard protocol during bolus administration of intravenous contrast. Multiplanar reconstructed images and MIPs were obtained and reviewed to evaluate the vascular anatomy.  CONTRAST: 29mL OMNIPAQUE IOHEXOL 350 MG/ML SOLN  COMPARISON: Chest CT 11/03/2014. CT neck, chest, abdomen and pelvis 04/25/2009.  FINDINGS: CTA CHEST FINDINGS  Mediastinum/Lymph Nodes: Heart size is mildly enlarged. There is a small filling defect in the tip of the left atrial appendage concerning for potential thrombus (although this may simply reflect pseudo  thrombus secondary to incomplete mixing of contrast material). There is no significant pericardial fluid, thickening or pericardial calcification. There is atherosclerosis of the thoracic aorta, the great vessels of the mediastinum and the coronary arteries, including calcified atherosclerotic plaque in the left main, left anterior descending, left circumflex and right coronary arteries. Severe thickening calcification of the aortic valve. Severe mitral annular calcifications. The thyroid gland is again markedly enlarged and heterogeneous in appearance with the largest mass-like component of the thyroid gland in the left lobe measuring up to 5.3 x 6.4 cm, very similar to prior study from 11/03/2014, most compatible with a multinodular goiter. There is apparent high-grade stenosis of the right axillary and subclavian veins, with numerous right-sided chest wall collateral veins. There are multiple borderline enlarged and mildly enlarged mediastinal and bilateral hilar lymph nodes, largest of which measures up to 1.2 cm in the low right paratracheal nodal station. The esophagus is unremarkable in appearance. No axillary lymphadenopathy.  Lungs/Pleura: Compared to the prior examination there is a new 12 x 8 mm (mean diameter of 10 mm) ground-glass attenuation nodule in the superior segment of the left lower lobe (image 18 of series 407). No other larger more suspicious appearing pulmonary nodules or masses are noted. There are areas of subpleural reticulation, parenchymal banding in mild traction bronchiectasis scattered throughout the lungs bilaterally, with a mild craniocaudal gradient. These findings are very similar to prior study 11/03/2014. No acute consolidative airspace disease. No pleural effusions.  Musculoskeletal/Soft Tissues: There are no aggressive appearing lytic or blastic lesions noted in the visualized portions of the skeleton.  CTA ABDOMEN AND PELVIS  FINDINGS  Hepatobiliary: No suspicious appearing cystic or solid hepatic lesions. No intra or extrahepatic biliary ductal dilatation. Gallbladder is unremarkable in appearance.  Pancreas: No pancreatic mass. No pancreatic ductal dilatation. No pancreatic or peripancreatic fluid or inflammatory changes.  Spleen: Unremarkable.  Adrenals/Urinary Tract: Mild bilateral renal atrophy. Bilateral kidneys and bilateral adrenal glands are otherwise normal in appearance. No hydroureteronephrosis. Urinary bladder is normal in appearance.  Stomach/Bowel: Normal appearance of the stomach. No pathologic dilatation of small bowel or colon. There are numerous colonic diverticulaei, most pronounced in the descending colon and sigmoid colon, without surrounding inflammatory changes to suggest an acute diverticulitis at this time.  Vascular/Lymphatic: Vascular findings and measurements pertinent to potential TAVR procedure, as detailed below. No aneurysm or dissection identified in the abdominal or pelvic vasculature. Single right renal artery. Two left renal arteries. Celiac axis, superior mesenteric artery and inferior mesenteric artery are all patent.  Reproductive: Uterus and ovaries are atrophic.  Other: Small bilateral inguinal hernias (left greater than right) containing only fat. No significant volume of ascites. No pneumoperitoneum.  Musculoskeletal: There are  no aggressive appearing lytic or blastic lesions noted in the visualized portions of the skeleton.  VASCULAR MEASUREMENTS PERTINENT TO TAVR:  AORTA:  Minimal Aortic Diameter - 12 x 14 mm  Severity of Aortic Calcification - severe  RIGHT PELVIS:  Right Common Iliac Artery -  Minimal Diameter - 9.5 x 9.4 mm  Tortuosity - mild  Calcification - moderate  Right External Iliac Artery -  Minimal Diameter - 7.2 x 6.0 mm  Tortuosity - moderate  Calcification - none  Right Common Femoral Artery  -  Minimal Diameter - 6.8 x 7.4 mm  Tortuosity - mild  Calcification - moderate to severe  LEFT PELVIS:  Left Common Iliac Artery -  Minimal Diameter - 4.9 x 11.3 mm  Tortuosity - moderate  Calcification - moderate  Left External Iliac Artery -  Minimal Diameter - 8.0 x 7.9 mm  Tortuosity - severe  Calcification - minimal  Left Common Femoral Artery -  Minimal Diameter - 3.7 x 5.4 mm  Tortuosity - mild  Calcification - moderate to severe  Review of the MIP images confirms the above findings.  IMPRESSION: 1. Findings and measurements pertinent to potential TAVR procedure, as detailed above. This patient does appear to have suitable right-sided pelvic arterial access. 2. Thickening calcification of the aortic valve, compatible with the reported clinical history of severe aortic stenosis. 3. Small filling defect in the tip of the left atrial appendage. This is favored to represent pseudothrombus, but close attention at time of future transesophageal echocardiogram is recommended to exclude the possibility of true left atrial appendage thrombus. 4. New ground-glass attenuation nodule with a mean diameter of 10 mm in the superior segment of the left lower lobe. Initial follow-up with CT at 6-12 months is recommended to confirm persistence. If persistent, repeat CT is recommended every 2 years until 5 years of stability has been established. This recommendation follows the consensus statement: Guidelines for Management of Incidental Pulmonary Nodules Detected on CT Images:From the Fleischner Society 2017; published online before print (10.1148/radiol.SG:5268862). 5. Severe colonic diverticulosis without evidence to suggest an acute diverticulitis at this time. 6. The appearance of the lungs remains compatible with interstitial lung disease. However, given the stability compared to the prior study 11/03/2014, this may simply represent mild fibrotic  phase nonspecific interstitial pneumonia (NSIP), as usual interstitial pneumonia (UIP) is typically progressive. 7. Multiple borderline enlarged and minimally enlarged mediastinal and bilateral hilar lymph nodes, likely chronic related to underlying interstitial lung disease. 8. Multinodular goiter redemonstrated. 9. Additional incidental findings, as above.   Electronically Signed  By: Vinnie Langton M.D.  On: 10/24/2015 13:33       Impression:  This 82 year old patient has stage D severe symptomatic aortic stenosis with NYHA class III symptoms of exertional fatigue and shortness of breath consistent with chronic diastolic heart failure. I have personally reviewed the patient's echocardiogram, cardiac cath, and CT angiograms. She has critical aortic stenosis with severe thickening, calcification, and restricted leaflet mobility involving all 3 leaflets of the  aortic valve. The mean transvalvular gradient was estimated at 74 mmHg. She also has significant mitral annular calcification and at moderate to severe mitral regurgitation.Left ventricular systolic function appears preserved with an ejection fraction of 55-60%. Diagnostic cardiac catheterization shows no significant coronary artery disease.   I think that the risks associated with conventional surgical aortic valve replacement would be high because of the patient's advanced age. She also has significant calcification of the aortic root. I would not consider  her a candidate for conventional surgery. Options include transcatheter aortic valve replacement versus long-term palliative medical therapy. Cardiac gated CT angiogram of the heart demonstrates findings consistent with severe aortic stenosis with anatomical features suitable for transcatheter aortic valve replacement without any significant, complicating features. CT angiography of the abdomen and pelvis shows significant aortoiliac disease with heavy calcification, but  the patient appears to have adequate pelvic vascular access for a transfemoral approach. She is still independent and mobile at 82 years old and I think TAVR is going to be the best treatment for her.   The patient and her son were counseled at length regarding treatment alternatives for management of severe symptomatic aortic stenosis. Alternative approaches such as conventional aortic valve replacement, transcatheter aortic valve replacement, and palliative medical therapy were compared and contrasted at length. The risks associated with conventional surgical aortic valve replacement were been discussed in detail, as were expectations for post-operative convalescence. Long-term prognosis with medical therapy was discussed. She would like to proceed with TAVR.  We discussed complications that might develop including but not limited to risks of death, stroke, paravalvular leak, aortic dissection or other major vascular complications, aortic annulus rupture, device embolization, cardiac rupture or perforation, mitral regurgitation, acute myocardial infarction, arrhythmia, heart block or bradycardia requiring permanent pacemaker placement, congestive heart failure, respiratory failure, renal failure, pneumonia, infection, other late complications related to structural valve deterioration or migration, or other complications that might ultimately cause a temporary or permanent loss of functional independence or other long term morbidity. The patient provides full informed consent for the procedure as described and all questions were answered.    Plan:  Transfemoral TAVR on 11/27/2015    Gaye Pollack, MD 11/26/2015 7:41 PM

## 2015-11-27 NOTE — Transfer of Care (Signed)
Immediate Anesthesia Transfer of Care Note  Patient: Alexa Mcdonald  Procedure(s) Performed: Procedure(s): TRANSCATHETER AORTIC VALVE REPLACEMENT, TRANSFEMORAL (N/A) TRANSESOPHAGEAL ECHOCARDIOGRAM (TEE) (N/A)  Patient Location: ICU  Anesthesia Type:General  Level of Consciousness: awake, oriented and patient cooperative  Airway & Oxygen Therapy: Patient Spontanous Breathing and Patient connected to nasal cannula oxygen  Post-op Assessment: Report given to RN, Post -op Vital signs reviewed and stable and Patient moving all extremities  Post vital signs: Reviewed and stable  Last Vitals:  Filed Vitals:   11/27/15 0841  BP: 167/65  Pulse: 77  Temp: [id number] C    Complications: No apparent anesthesia complications

## 2015-11-27 NOTE — Op Note (Signed)
[facility]   MULTIDISCIPLINARY HEART VALVE TEAM TAVR OPERATIVE NOTE   Date of Procedure:  11/27/2015  Preoperative Diagnosis: Severe Aortic Stenosis   Postoperative Diagnosis: Same   Procedure:   Transcatheter Aortic Valve Replacement - Percutaneous Transfemoral Approach  Edwards Sapien 3 THV (size 23 mm, model # 9600TFX, serial # [id number])   Co-Surgeons:  Valentina Gu. [name], MD and [name], MD  Anesthesiologist:  Dr Orene Desanctis  Echocardiographer:  Dr Meda Coffee  Pre-operative Echo Findings:  Severe aortic stenosis  Normal left ventricular systolic function  [id number]  mitral regurgitation  Post-operative Echo Findings:  Trace paravalvular leak  normal left ventricular systolic function  Unchanged mitral regurgitation  BRIEF CLINICAL NOTE AND INDICATIONS FOR SURGERY  The patient is a 82 year old woman with a history of hypertension, PAF on coumadin, stage 3 chronic kidney disease and severe aortic stenosis with chronic diastolic heart failure who has been followed by Dr. [name] for several years. Echocardiograms have shown the gradual progression of severity of her aortic stenosis. She was referred for surgical consultation in July 2016for severe aortic stenosis but claimed to be relatively asymptomatic at that time and refused further workup. Over the last several months she has developed progressive symptoms of exertional shortness of breath, dizziness, and fatigue. She now gets short of breath with moderate to low-level activities like walking in her house. She has not had any resting shortness of breath, PND, orthopnea, or lower extremity edema. She has never had any chest pain or chest tightness. A repeat echocardiogram reveals severe aortic stenosis with peak velocity across the aortic valve measuring greater than 5 m/s. Mean transvalvular gradient is 74 mmHg. Left ventricular systolic function remains normal with an ejection fraction of 55-60%. The patient  does have significant mitral annular calcification and at moderate to severe mitral regurgitation. Cardiac cath showed mild non-obstructive coronary artery disease and severe aortic stenosis with an aortic valve area calculated at 0.8 cm. Pulmonary artery pressures were mildly elevated at 42/20 with mean pulmonary artery wedge pressure of 13. CTA studies show anatomy suitable for TAVR with a 23 mm Sapien 3 valve via transfemoral access.  During the course of the patient's preoperative work up they have been evaluated comprehensively by a multidisciplinary team of specialists coordinated through the [facility] in the [facility].  They have been demonstrated to suffer from symptomatic severe aortic stenosis as noted above. The patient has been counseled extensively as to the relative risks and benefits of all options for the treatment of severe aortic stenosis including long term medical therapy, conventional surgery for aortic valve replacement, and transcatheter aortic valve replacement.  The patient has been independently evaluated by two cardiac surgeons including Dr. [name] and Dr. Cyndia Bent, and they are felt to be at high risk for conventional surgical aortic valve replacement based upon a predicted risk of mortality using the Society of Thoracic Surgeons risk calculator of 9.7%. Based upon review of all of the patient's preoperative diagnostic tests they are felt to be candidate for transcatheter aortic valve replacement using the transfemoral approach as an alternative to high risk conventional surgery.    Following the decision to proceed with transcatheter aortic valve replacement, a discussion has been held regarding what types of management strategies would be attempted intraoperatively in the event of life-threatening complications, including whether or not the patient would be considered a candidate for the use of cardiopulmonary bypass and/or  conversion to open sternotomy for  attempted surgical intervention.  The patient has been advised of a variety of complications that might develop peculiar to this approach including but not limited to risks of death, stroke, paravalvular leak, aortic dissection or other major vascular complications, aortic annulus rupture, device embolization, cardiac rupture or perforation, acute myocardial infarction, arrhythmia, heart block or bradycardia requiring permanent pacemaker placement, congestive heart failure, respiratory failure, renal failure, pneumonia, infection, other late complications related to structural valve deterioration or migration, or other complications that might ultimately cause a temporary or permanent loss of functional independence or other long term morbidity.  The patient provides full informed consent for the procedure as described and all questions were answered preoperatively.    DETAILS OF THE OPERATIVE PROCEDURE  See Complete note of Dr [name] for full details of the operative procedure.   PERIPHERAL ACCESS:    Using the modified Seldinger technique, femoral arterial and venous access was obtained with placement of 6 Fr sheaths on the left side.  It was difficult to access the left femoral artery and initially a wire could not be passed. Manual pressure was held and a second attempt was successful.   TRANSFEMORAL ACCESS:  A micropuncture technique is used to access the right femoral artery under fluoroscopic guidance.  Femoral angiography is performed to verify access in the common femoral artery. 2 Perclose devices are deployed at 10' and 2' positions to 'PreClose' the femoral artery. An 8 French sheath is placed and then an Amplatz Superstiff wire is advanced through the sheath. This is changed out for a 14 French transfemoral E-Sheath after progressively dilating over the Superstiff wire.   PROCEDURE COMPLETION:  The sheath was removed and femoral artery closure is performed  using the 2 previously deployed Perclose devices.   Protamine was administered once femoral arterial repair was complete. The temporary pacemaker, pigtail catheters and femoral sheaths were removed with manual pressure used for hemostasis.   The patient tolerated the procedure well and is transported to the surgical intensive care in stable condition. There were no immediate intraoperative complications. All sponge instrument and needle counts are verified correct at completion of the operation.   The patient received a total of 45 mL of intravenous contrast during the procedure (two 20 cc aortic root injections and one 5 cc femoral angiogram).  [name], MD 11/27/2015 2:47 PM

## 2015-11-27 NOTE — Op Note (Signed)
[facility]   MULTIDISCIPLINARY HEART VALVE TEAM   TAVR OPERATIVE NOTE   Date of Procedure:  11/27/2015  Preoperative Diagnosis: Severe Aortic Stenosis   Postoperative Diagnosis: Same   Procedure:    Transcatheter Aortic Valve Replacement - Percutaneous Right Transfemoral Approach  Edwards Sapien 3 THV (size 23 mm, model # 9600TFX, serial # [id number])   Co-Surgeons:  Valentina Gu. [name], MD and [name], MD  Anesthesiologist:  Rica Koyanagi, MD  Echocardiographer:  Ena Dawley, MD  Pre-operative Echo Findings:  Severe aortic stenosis  Moderate-severe mitral regurgitation  Normal left ventricular systolic function  Post-operative Echo Findings:  No paravalvular leak  Normal left ventricular systolic function   BRIEF CLINICAL NOTE AND INDICATIONS FOR SURGERY   Patient is a 82 year old widowed white female from [place] with severe aortic stenosis, mitral regurgitation, chronic diastolic congestive heart failure, hypertension, paroxysmal atrial fibrillation on long-term anticoagulation using warfarin, stage III chronic kidney disease, and degenerative arthritith osteoporosis who returns to the office today to further discuss surgical treatment options. The patient has been followed for several years by Dr. [name] and echocardiograms have demonstrated the gradual progression of severity of aortic stenosis. She was referred for surgical consultation more than a year and a half ago, and I had the opportunity to see the patient in consultation on 03/03/2014. The patient was noted to have very severe aortic stenosis but claimed to be relatively asymptomatic at that time. She declined an opportunity to proceed with further diagnostic workup and possible surgery and elected to follow up closely with Dr. [name] on medical therapy. Over the last several months the patient has developed progressive symptoms of exertional shortness of breath, dizziness, and  fatigue. She now gets short of breath with moderate to low-level activities. She has not had any resting shortness of breath, PND, orthopnea, or lower extremity edema. She has never had any chest pain or chest tightness. She was seen in follow-up recent but Dr. [name] and repeat echocardiogram reveals severe aortic stenosis with peak velocity across the aortic valve at times measuring greater than 5 m/s. Mean transvalvular gradient estimated 74 mmHg. Left ventricular systolic function remains normal with ejection fraction estimated 55-60%. The patient does have significant mitral annular calcification and at least moderate mitral regurgitation. The patient was referred back to Dr. [name] who performed left and right heart catheterization on 10/31/2015. The patient was found to have findings consistent with mild non-obstructive coronary artery disease and severe aortic stenosis with aortic valve cross-sectional area calculated 0.8 cm. Pulmonary artery pressures were mildly elevated, reported 42/20 with mean pulmonary artery wedge pressure only 13. The patient was referred for cardiac surgical consultation.  The patient has been seen in consultation and counseled at length regarding the indications, risks and potential benefits of surgery.  All questions have been answered, and the patient provides full informed consent for the operation as described.     DETAILS OF THE OPERATIVE PROCEDURE  PREPARATION:    The patient is brought to the operating room on the above mentioned date and central monitoring was established by the anesthesia team including placement of a radial arterial line. The patient is placed in the supine position on the operating table.  Intravenous antibiotics are administered. General endotracheal anesthesia is induced uneventfully.  A Foley catheter is placed.  Baseline transesophageal echocardiogram was performed. The patient's chest, abdomen, both groins, and both lower extremities  are prepared and draped in a sterile manner. A time out procedure is performed.  PERIPHERAL ACCESS:    Using the modified Seldinger technique, femoral arterial and venous access was obtained with placement of 6 Fr sheaths on the left side.  A pigtail diagnostic catheter was passed through the left arterial sheath under fluoroscopic guidance into the aortic root.  A temporary transvenous pacemaker catheter was passed through the left femoral venous sheath under fluoroscopic guidance into the right ventricle.  The pacemaker was tested to ensure stable lead placement and pacemaker capture. Aortic root angiography was performed in order to determine the optimal angiographic angle for valve deployment.   TRANSFEMORAL ACCESS:   Percutaneous transfemoral access and sheath placement was performed by Dr [name]. Please see his separate operative note for details. The patient was heparinized systemically and ACT verified > 250 seconds.    A 14 Fr transfemoral E-sheath was introduced into the right femoral artery after progressively dilating over an Amplatz superstiff wire. An AL-1 catheter was used to direct a straight-tip exchange length wire across the native aortic valve into the left ventricle. This was exchanged out for a pigtail catheter and position was confirmed in the LV apex. Simultaneous LV and Ao pressures were recorded.  The pigtail catheter was exchanged for an Amplatz Extra-stiff wire in the LV apex.  Echocardiography was utilized to confirm appropriate wire position and no sign of entanglement in the mitral subvalvular apparatus.   BALLOON AORTIC VALVULOPLASTY:   Balloon aortic valvuloplasty was performed using a 20 mm valvuloplasty balloon.  Once optimal position was achieved, BAV was done under rapid ventricular pacing. The patient recovered well hemodynamically.    TRANSCATHETER HEART VALVE DEPLOYMENT:   An Edwards Sapien 3 transcatheter heart valve (size 23 mm, model #9600TFX, serial  [id number]) was prepared and crimped per manufacturer's guidelines, and the proper orientation of the valve is confirmed on the [employer] delivery system. The valve was advanced through the introducer sheath using normal technique until in an appropriate position in the abdominal aorta beyond the sheath tip. The balloon was then retracted and using the fine-tuning wheel was centered on the valve. The valve was then advanced across the aortic arch using appropriate flexion of the catheter. The valve was carefully positioned across the aortic valve annulus. The Commander catheter was retracted using normal technique. Once final position of the valve has been confirmed by angiographic assessment, the valve is deployed while temporarily holding ventilation and during rapid ventricular pacing to maintain systolic blood pressure < 50 mmHg and pulse pressure < 10 mmHg. The balloon inflation is held for >3 seconds after reaching full deployment volume. Once the balloon has fully deflated the balloon is retracted into the ascending aorta and valve function is assessed using echocardiography. There is felt to be trivial paravalvular leak and no central aortic insufficiency.  The patient's hemodynamic recovery following valve deployment is good.  The deployment balloon and guidewire are both removed. Echo demostrated acceptable post-procedural gradients, stable mitral valve function, and no aortic insufficiency.    PROCEDURE COMPLETION:   The sheath was removed and femoral artery closure performed by Dr [name]. Please see his separate report for details. Protamine was administered once femoral arterial repair was complete. The temporary pacemaker, pigtail catheters and femoral sheaths were removed with manual pressure used for hemostasis.   The patient tolerated the procedure well and is transported to the surgical intensive care in stable condition. There were no immediate intraoperative complications. All sponge  instrument and needle counts are verified correct at completion of the operation.   No blood  products were administered during the operation.  The patient received a total of 115.8 mL of intravenous contrast during the procedure.   Rexene Alberts, MD 11/27/2015 12:59 PM

## 2015-11-27 NOTE — Progress Notes (Signed)
Called to room 39 for an arterial line that was continuously bleeding. Ecchymosis present. TR band applied, and adjusted to 10cc on left wrist. Spo2 98% as measured in left hand. Patient taken to OR 16 afterwards.

## 2015-11-27 NOTE — Anesthesia Procedure Notes (Signed)
Procedure Name: Intubation Date/Time: 11/27/2015 11:16 AM Performed by: Melina Copa, Jarquis Walker R Pre-anesthesia Checklist: Patient identified, Emergency Drugs available, Suction available and Patient being monitored Patient Re-evaluated:Patient Re-evaluated prior to inductionOxygen Delivery Method: Circle System Utilized Preoxygenation: Pre-oxygenation with 100% oxygen Intubation Type: IV induction Ventilation: Mask ventilation without difficulty Laryngoscope Size: Mac and 3 Grade View: Grade II Tube type: Oral Tube size: 7.0 mm Number of attempts: 1 Airway Equipment and Method: Stylet Placement Confirmation: ETT inserted through vocal cords under direct vision,  positive ETCO2 and breath sounds checked- equal and bilateral Secured at: 20 cm Tube secured with: Tape Dental Injury: Teeth and Oropharynx as per pre-operative assessment

## 2015-11-28 ENCOUNTER — Encounter (HOSPITAL_COMMUNITY): Payer: Self-pay | Admitting: Cardiovascular Disease

## 2015-11-28 ENCOUNTER — Other Ambulatory Visit: Payer: Self-pay

## 2015-11-28 ENCOUNTER — Inpatient Hospital Stay (HOSPITAL_COMMUNITY): Payer: Medicare Other

## 2015-11-28 DIAGNOSIS — I350 Nonrheumatic aortic (valve) stenosis: Secondary | ICD-10-CM

## 2015-11-28 DIAGNOSIS — Z952 Presence of prosthetic heart valve: Secondary | ICD-10-CM

## 2015-11-28 DIAGNOSIS — I481 Persistent atrial fibrillation: Secondary | ICD-10-CM

## 2015-11-28 LAB — GLUCOSE, CAPILLARY
Glucose-Capillary: 117 mg/dL — ABNORMAL HIGH (ref 65–99)
Glucose-Capillary: 126 mg/dL — ABNORMAL HIGH (ref 65–99)
Glucose-Capillary: 139 mg/dL — ABNORMAL HIGH (ref 65–99)
Glucose-Capillary: 166 mg/dL — ABNORMAL HIGH (ref 65–99)

## 2015-11-28 LAB — CBC
HCT: 29.4 % — ABNORMAL LOW (ref 36.0–46.0)
Hemoglobin: 9.6 g/dL — ABNORMAL LOW (ref 12.0–15.0)
MCH: 28.8 pg (ref 26.0–34.0)
MCHC: 32.7 g/dL (ref 30.0–36.0)
MCV: 88.3 fL (ref 78.0–100.0)
Platelets: 170 10*3/uL (ref 150–400)
RBC: 3.33 MIL/uL — ABNORMAL LOW (ref 3.87–5.11)
RDW: 14.2 % (ref 11.5–15.5)
WBC: 9.5 10*3/uL (ref 4.0–10.5)

## 2015-11-28 LAB — BASIC METABOLIC PANEL
Anion gap: 8 (ref 5–15)
BUN: 14 mg/dL (ref 4–21)
BUN: 14 mg/dL (ref 6–20)
CO2: 23 mmol/L (ref 22–32)
Calcium: 8.1 mg/dL — ABNORMAL LOW (ref 8.9–10.3)
Chloride: 106 mmol/L (ref 101–111)
Creatinine, Ser: 1.16 mg/dL — ABNORMAL HIGH (ref 0.44–1.00)
Creatinine: 1.2 mg/dL — AB (ref 0.5–1.1)
GFR calc Af Amer: 46 mL/min — ABNORMAL LOW (ref >60)
GFR calc non Af Amer: 40 mL/min — ABNORMAL LOW (ref >60)
Glucose, Bld: 131 mg/dL — ABNORMAL HIGH (ref 65–99)
Glucose: 131 mg/dL
Potassium: 3.9 mmol/L (ref 3.5–5.1)
Sodium: 137 mmol/L (ref 135–145)
Sodium: 137 mmol/L (ref 137–147)

## 2015-11-28 LAB — CBC AND DIFFERENTIAL: WBC: 9.5 10^3/mL

## 2015-11-28 LAB — MAGNESIUM: Magnesium: 2.5 mg/dL — ABNORMAL HIGH (ref 1.7–2.4)

## 2015-11-28 IMAGING — CR DG CHEST 1V PORT
1 series · 1 of 1 positions shown · non-contrast
Comparison: [DATE].

CLINICAL DATA: Atelectasis, congestive heart failure.

EXAM:
PORTABLE CHEST 1 VIEW

[AP]
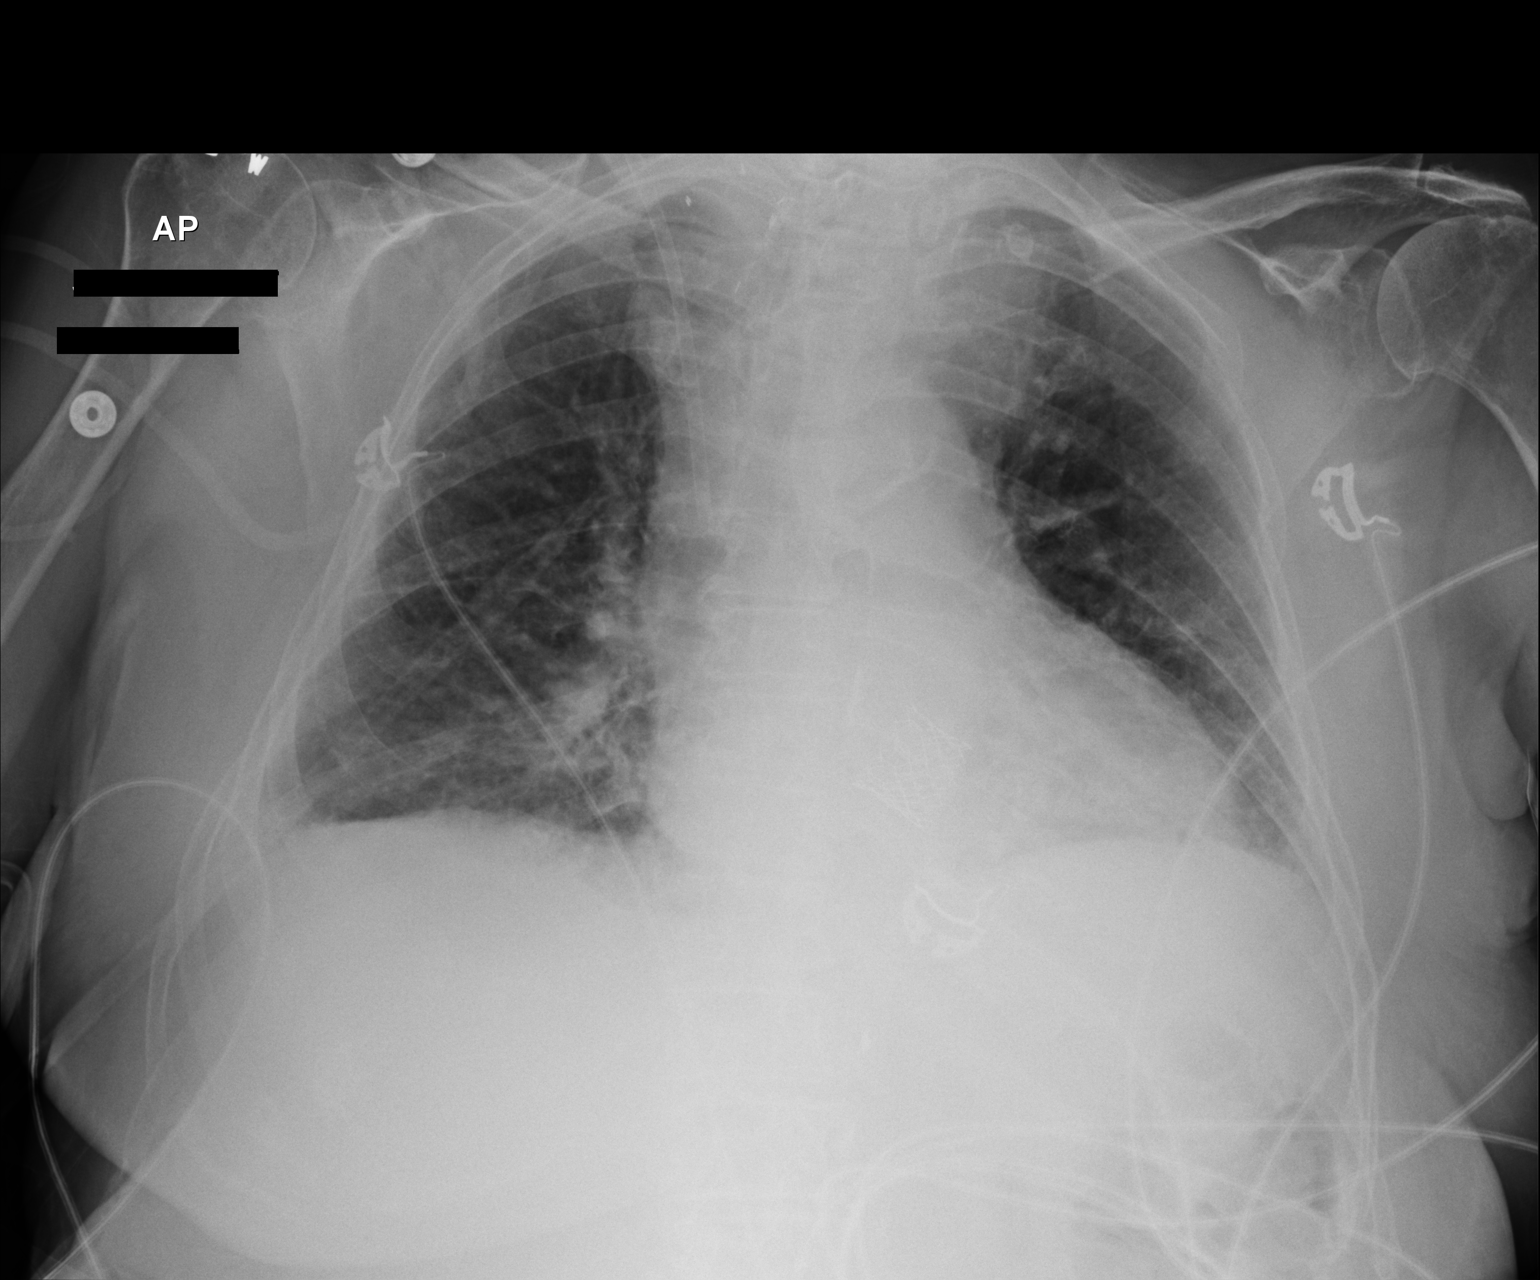

[1 of 1 positions shown; findings below may reference images not displayed]

FINDINGS: Right IJ central line tip projects over the SVC. Heart size stable.
Thoracic aorta is calcified. Lungs are low in volume with mild
residual bibasilar airspace opacification. Overall aeration has
improved bilaterally. No definite pleural fluid.
IMPRESSION: Resolved pulmonary edema with mild residual bibasilar atelectasis.

## 2015-11-28 MED ORDER — ASPIRIN 81 MG PO CHEW
81.0000 mg | CHEWABLE_TABLET | Freq: Every day | ORAL | Status: DC
  Administered 2015-11-29 – 2015-11-30 (×2): 81 mg via ORAL
  Filled 2015-11-28 (×2): qty 1

## 2015-11-28 MED ORDER — FUROSEMIDE 20 MG PO TABS
20.0000 mg | ORAL_TABLET | Freq: Every day | ORAL | Status: DC
  Administered 2015-11-28 – 2015-11-30 (×3): 20 mg via ORAL
  Filled 2015-11-28 (×4): qty 1

## 2015-11-28 NOTE — Progress Notes (Signed)
Pt received from ICU RN. Pt oriented to room and equipment, denies pain or needs. VSS. CCMD notified. Call light within reach, will continue to monitor.   Fritz Pickerel, RN

## 2015-11-28 NOTE — Research (Signed)
Envisage Informed Consent   Subject Name: Alexa Mcdonald  Subject met inclusion and exclusion criteria.  The informed consent form, study requirements and expectations were reviewed with the subject and questions and concerns were addressed prior to the signing of the consent form.  The subject verbalized understanding of the trail requirements.  The subject agreed to participate in the Envisage trial and signed the informed consent.  The informed consent was obtained prior to performance of any protocol-specific procedures for the subject.  A copy of the signed informed consent was given to the subject and a copy was placed in the subject's medical record.  Sandie Ano 11/28/2015, 16:30

## 2015-11-28 NOTE — Progress Notes (Signed)
CARDIAC REHAB PHASE I   PRE:  Rate/Rhythm: 72 afib  BP:  Supine:   Sitting: 145/64  Standing:    SaO2: 95%RA  MODE:  Ambulation: 150 ft   POST:  Rate/Rhythm: 76 afib  BP:  Supine:   Sitting: 139/75  Standing:    SaO2: 96%RA 1450-1522 Pt walked 150 ft with rolling walker and asst x 1 with fairly steady gait. First walk in hall. Able to walk in room with just hand held asst. Tolerated well. To recliner with call bell.   Graylon Good, RN BSN  11/28/2015 3:19 PM

## 2015-11-28 NOTE — Progress Notes (Addendum)
    Subjective:  No CP or shortness of breath this am.   Objective:  Vital Signs in the last 24 hours: Temp:  [97.6 F (36.4 C)-98.9 F (37.2 C)] 97.6 F (36.4 C) (04/26 0700) Pulse Rate:  [51-133] 79 (04/26 0700) Resp:  [14-25] 23 (04/26 0700) BP: (115-183)/(46-93) 134/51 mmHg (04/26 0700) SpO2:  [94 %-100 %] 95 % (04/26 0700) Arterial Line BP: (178-182)/(67-80) 182/80 mmHg (04/25 1400)  Intake/Output from previous day: 04/25 0701 - 04/26 0700 In: 2080.9 [I.V.:1780.9; IV Piggyback:300] Out: 1980 [Urine:1730; Blood:250]  Physical Exam: Pt is alert and oriented, pleasant elderly woman in NAD HEENT: normal Neck: JVP - normal Lungs: CTA bilaterally CV: irregular without murmur or gallop Abd: soft, NT, Positive BS, no hepatomegaly Ext: no C/C/E, distal pulses intact and equal, small left groin ecchymoses, no hematoma, right groin site clear Skin: warm/dry no rash  Lab Results:  Recent Labs  11/27/15 2035 11/27/15 2040 11/28/15 0415  WBC 10.3  --  9.5  HGB 9.6* 9.9* 9.6*  PLT 184  --  170    Recent Labs  11/26/15 1511  11/27/15 2040 11/28/15 0415  NA 140  < > 138 137  K 4.2  < > 4.1 3.9  CL 106  < > 104 106  CO2 21*  --   --  23  GLUCOSE 133*  < > 186* 131*  BUN 21*  < > 18 14  CREATININE 1.18*  < > 1.00 1.16*  < > = values in this interval not displayed. No results for input(s): TROPONINI in the last 72 hours.  Invalid input(s): CK, MB  Cardiac Studies: 2 D Echo pending today  Tele: Atrial fibrillation, LBBB, no change from baseline  Assessment/Plan:  1. Acute on chronic diastolic heart failure, NYHA III, now POD#1 from TAVR. Start on oral lasix today, otherwise continue same Rx. Resume ACE tomorrow if BP stable.  2. S/P TAVR POD #1 - doing well will tx to 2W bed and dc lines, mobilize. Plan ASA 81 mg and consider enrollment in TAVI-AF Trial randomizing pt to warfarin versus Savaysa. Anticipate start anticoagulant Rx tomorrow.  2D Echo today,. Cardiac  rehab eval today.  3. Post-op blood loss anemia, mild, stable.   4. CKD III: stable labs  5. AFIB: rhythm stable. Start oral anticoagulation tomorrow.   Alexa Mcdonald, M.D. 11/28/2015, 9:23 AM

## 2015-11-28 NOTE — Progress Notes (Signed)
      [address]       [place] [address]             [phone]        CARDIOTHORACIC SURGERY PROGRESS NOTE   R1 Day Post-Op Procedure(s) (LRB): TRANSCATHETER AORTIC VALVE REPLACEMENT, TRANSFEMORAL (N/A) TRANSESOPHAGEAL ECHOCARDIOGRAM (TEE) (N/A)  Subjective: Looks great.  No complaints.  Objective: Vital signs: BP Readings from Last 1 Encounters:  11/28/15 134/51   Pulse Readings from Last 1 Encounters:  11/28/15 79   Resp Readings from Last 1 Encounters:  11/28/15 23   Temp Readings from Last 1 Encounters:  11/28/15 97.6 F (36.4 C) Oral    Hemodynamics:    Physical Exam:  Rhythm:   Afib  Breath sounds: clear  Heart sounds:  Irregular w/out murmur  Incisions:  N/A - both groins okay - stable hematoma on left  Abdomen:  Soft, non-distended, non-tender  Extremities:  Warm, well-perfused    Intake/Output from previous day: 04/25 0701 - 04/26 0700 In: 2080.9 [I.V.:1780.9; IV Piggyback:300] Out: 1980 [Urine:1730; Blood:250] Intake/Output this shift:    Lab Results:  CBC: Recent Labs  11/27/15 2035 11/27/15 2040 11/28/15 0415  WBC 10.3  --  9.5  HGB 9.6* 9.9* 9.6*  HCT 29.7* 29.0* 29.4*  PLT 184  --  170    BMET:  Recent Labs  11/26/15 1511  11/27/15 2040 11/28/15 0415  NA 140  < > 138 137  K 4.2  < > 4.1 3.9  CL 106  < > 104 106  CO2 21*  --   --  23  GLUCOSE 133*  < > 186* 131*  BUN 21*  < > 18 14  CREATININE 1.18*  < > 1.00 1.16*  CALCIUM 9.0  --   --  8.1*  < > = values in this interval not displayed.   PT/INR:   Recent Labs  11/27/15 1500  LABPROT 19.5*  INR 1.65*    CBG (last 3)   Recent Labs  11/28/15 0033 11/28/15 0414 11/28/15 0755  GLUCAP 139* 126* 117*    ABG    Component Value Date/Time   PHART 7.375 11/27/2015 1500   PCO2ART 38.7 11/27/2015 1500   PO2ART 107.0* 11/27/2015 1500   HCO3 22.7 11/27/2015 1500   TCO2 22 11/27/2015 2040   ACIDBASEDEF 2.0 11/27/2015 1500   O2SAT 98.0  11/27/2015 1500    CXR: PORTABLE CHEST 1 VIEW  COMPARISON: 11/27/2015.  FINDINGS: Right IJ central line tip projects over the SVC. Heart size stable. Thoracic aorta is calcified. Lungs are low in volume with mild residual bibasilar airspace opacification. Overall aeration has improved bilaterally. No definite pleural fluid.  IMPRESSION: Resolved pulmonary edema with mild residual bibasilar atelectasis.   Electronically Signed  By: Lorin Picket M.D.  On: 11/28/2015 08:07   EKG: Afib w/ LBBB, no acute ischemic changes, unchanged from preop    Assessment/Plan: S/P Procedure(s) (LRB): TRANSCATHETER AORTIC VALVE REPLACEMENT, TRANSFEMORAL (N/A) TRANSESOPHAGEAL ECHOCARDIOGRAM (TEE) (N/A)  Doing very well POD1 Plan as outlined by Dr [name]  I spent in excess of 15 minutes during the conduct of this hospital encounter and >50% of this time involved direct face-to-face encounter with the patient for counseling and/or coordination of their care.   Rexene Alberts, MD 11/28/2015 10:33 AM

## 2015-11-29 ENCOUNTER — Inpatient Hospital Stay (HOSPITAL_COMMUNITY): Payer: Medicare Other

## 2015-11-29 DIAGNOSIS — I350 Nonrheumatic aortic (valve) stenosis: Secondary | ICD-10-CM

## 2015-11-29 LAB — ECHOCARDIOGRAM COMPLETE
Height: 60.5 in
Weight: 2184 oz

## 2015-11-29 LAB — PROTIME-INR
INR: 1.44 (ref 0.00–1.49)
Prothrombin Time: 17.7 seconds — ABNORMAL HIGH (ref 11.6–15.2)

## 2015-11-29 MED ORDER — STUDY - ENVISAGE TAVI AF EDOXABAN 30 MG(DU176B-C-U4001)
30.0000 mg | ORAL_TABLET | Freq: Every day | ORAL | Status: DC
  Administered 2015-11-29 – 2015-11-30 (×2): 30 mg via ORAL
  Filled 2015-11-29 (×2): qty 30

## 2015-11-29 MED FILL — Dextrose Inj 5%: INTRAVENOUS | Qty: 250 | Status: AC

## 2015-11-29 MED FILL — Phenylephrine HCl Inj 10 MG/ML: INTRAMUSCULAR | Qty: 2 | Status: AC

## 2015-11-29 MED FILL — Insulin Regular (Human) Inj 100 Unit/ML: INTRAMUSCULAR | Qty: 250 | Status: AC

## 2015-11-29 NOTE — Progress Notes (Signed)
Echocardiogram 2D Echocardiogram has been performed.  Joelene Millin 11/29/2015, 10:24 AM

## 2015-11-29 NOTE — Clinical Social Work Note (Signed)
Clinical Social Work Assessment  Patient Details  Name: Alexa Mcdonald MRN: [id number] Date of Birth: September 07, 1920  Date of referral:  11/29/15               Reason for consult:  Discharge Planning                Permission sought to share information with:  Chartered certified accountant granted to share information::  Yes, Verbal Permission Granted  Name::     Biochemist, clinical::  SNFs  Relationship::  Son  Contact Information:     Housing/Transportation Living arrangements for the past 2 months:  Single Family Home Source of Information:  Patient Patient Interpreter Needed:  None Criminal Activity/Legal Involvement Pertinent to Current Situation/Hospitalization:  No - Comment as needed Significant Relationships:  Adult Children Lives with:  Self Do you feel safe going back to the place where you live?  Yes Need for family participation in patient care:  Yes (Comment)  Care giving concerns:  The patient is aggregable for short term rehab at discharge. Patient would like to rebuild  her strength to return home. Patient's son is in agreement with patient to go to a facility for short term rehab.     Social Worker assessment / plan:  CSW met with patient at beside to complete assessment. Patient was resting comfortably in bed.  CSW explained SNF search and placement process to the patient and answered her questions. Patient reported her support is her son. Per patient request, CSW called patient's son.  CSW explained SNF search and placement process to the patient's son and answered his questions. CSW will follow up with bed offers.   Employment status:  Retired Forensic scientist:  Medicare PT Recommendations:  Not assessed at this time [place] / Referral to community resources:  [place]  Patient/Family's Response to care:  The patient appears happy with the care she is receiving in hospital and is appreciative of CSW assistance.  Patient/Family's  Understanding of and Emotional Response to Diagnosis, Current Treatment, and Prognosis:  The patient has a good understanding of why she was admitted. She understands the care plan and what she will need post discharge.  Emotional Assessment Appearance:  Appears stated age Attitude/Demeanor/Rapport:   (The patient was welcoming and appropriate.) Affect (typically observed):  Accepting, Calm, Appropriate Orientation:  Oriented to Self, Oriented to Place, Oriented to  Time, Oriented to Situation Alcohol / Substance use:  Not Applicable Psych involvement (Current and /or in the community):  No (Comment)  Discharge Needs  Concerns to be addressed:  Discharge Planning Concerns Readmission within the last 30 days:  No Current discharge risk:  Physical Impairment, Lives alone Barriers to Discharge:  Continued Medical Work up   [employer], LCSW [phone]  11/29/2015, 4:45 PM

## 2015-11-29 NOTE — NC FL2 (Signed)
[place] LEVEL OF CARE SCREENING TOOL     IDENTIFICATION  Patient Name: Alexa Mcdonald Birthdate: [birth year] Sex: female Admission Date (Current Location): 11/27/2015  [facility] and [place] Number:  Herbalist and Address:  [facility] [place], [place] [address] [place], [place] [address]      Provider Number: [id number]  Attending Physician Name and Address:  [name], MD  Relative Name and Phone Number:       Current Level of Care: Hospital Recommended Level of Care: [place] Prior Approval Number:    Date Approved/Denied:   PASRR Number: [id number]  Discharge Plan: SNF    Current Diagnoses: Patient Active Problem List   Diagnosis Date Noted  . 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 Severe aortic stenosis   . Atrial fibrillation, persistent ([place])   . Mitral regurgitation   . Interstitial lung disease ([place]) 10/30/2014  . Hematochezia 12/13/2013  . CKD (chronic kidney disease), stage III 12/13/2013  . Hyperthyroidism 12/13/2013  . GERD (gastroesophageal reflux disease) 12/13/2013  . Long term current use of anticoagulant therapy 12/13/2013  . Elevated brain natriuretic peptide (BNP) level 12/13/2013  . Sinus bradycardia 12/13/2013  . Pneumonia 12/13/2013  . Encounter for therapeutic drug monitoring 08/29/2013  . Aortic stenosis, severe 08/05/2013  . Essential hypertension 08/05/2013  . Edema extremities 08/05/2013  . Bradycardia 11/27/2011    Orientation RESPIRATION BLADDER Height & Weight     Time, Situation, Place, Self  Normal Continent Weight: 136 lb 8 oz (61.916 kg) Height:  5' 0.5" (153.7 cm)  BEHAVIORAL SYMPTOMS/MOOD NEUROLOGICAL BOWEL NUTRITION STATUS   (None)  (None) Continent Diet (Heart Healthy/ Carb Modified)  AMBULATORY STATUS COMMUNICATION OF NEEDS Skin   Extensive Assist Verbally  (Incision Left Leg, Incision Right Leg)                        Personal Care Assistance Level of Assistance  Bathing, Feeding, Dressing Bathing Assistance: Limited assistance Feeding assistance: Limited assistance Dressing Assistance: Limited assistance     Functional Limitations Info  Sight, Hearing, Speech Sight Info: Adequate Hearing Info: Adequate Speech Info: Adequate    SPECIAL CARE FACTORS FREQUENCY  PT (By licensed PT)     PT Frequency: 5/ week OT Frequency: 5/ week            Contractures Contractures Info: Not present    Additional Factors Info  Code Status, Allergies Code Status Info: Full Allergies Info: Amoxicillin, Celebrex           Current Medications (11/29/2015):  This is the current hospital active medication list Current Facility-Administered Medications  Medication Dose Route Frequency Provider Last Rate Last Dose  . aspirin chewable tablet 81 mg  81 mg Oral Daily [name], MD   81 mg at 11/29/15 1040  . [facility]AGE TAVI - AF Study - edoxaban 30mg  ([id number])  30 mg Oral Daily [name], MD      . furosemide (LASIX) tablet 20 mg  20 mg Oral Daily [name], MD   20 mg at 11/29/15 1041  . methimazole (TAPAZOLE) tablet 7.5 mg  7.5 mg Oral Daily Rexene Alberts, MD   7.5 mg at 11/29/15 1041  . metoprolol (LOPRESSOR) injection 2.5-5 mg  2.5-5 mg Intravenous Q2H PRN Rexene Alberts, MD      . ondansetron [facility]) injection 4 mg  4 mg Intravenous Q6H PRN Rexene Alberts, MD      .  pantoprazole (PROTONIX) EC tablet 40 mg  40 mg Oral Daily Rexene Alberts, MD   40 mg at 11/29/15 1041  . pravastatin (PRAVACHOL) tablet 10 mg  10 mg Oral QHS Rexene Alberts, MD   10 mg at 11/28/15 2127  . traMADol (ULTRAM) tablet 50-100 mg  50-100 mg Oral Q4H PRN Rexene Alberts, MD         Discharge Medications: Please see discharge summary for a list of discharge medications.  Relevant Imaging Results:  Relevant Lab Results:   Additional Information SSN: [id number]  Samule Dry, LCSW

## 2015-11-29 NOTE — Progress Notes (Signed)
Pt is having some moments of bradycardia during her sleep but she will come right back up to normal rate. She is asymptomatic with no distress noted. Will continue to monitor pt closely.

## 2015-11-29 NOTE — Progress Notes (Signed)
CARDIAC REHAB PHASE I   PRE:  Rate/Rhythm: 56 a fib  BP:  Sitting: 158/59        SaO2: 98 RA  MODE:  Ambulation: 350 ft   POST:  Rate/Rhythm: 67 a fib  BP:  Sitting: 158/67         SaO2: 96 RA  Pt up in chair, agreeable to walk. Pt ambulated 350 ft on RA, rolling walker, assist x1, steady gait, tolerated well, brief standing rest x1. Pt able to increase distance today. Encouraged additional ambulation x2 today with staff. Pt states she does not think she can go home by herself. Pt states she is hoping to go to [facility] at discharge. If so, pt will need SW consult. Pt to bed for echo after walk, call bell within reach. Will follow.  [place], RN, BSN 11/29/2015  9:46 AM

## 2015-11-29 NOTE — Evaluation (Signed)
Physical Therapy Evaluation Patient Details Name: Alexa Mcdonald MRN: [id number] DOB: 07-21-1921 Today's Date: 11/29/2015   History of Present Illness  Pt s/p TAVR. PMH - CHF, a-fib, CKD  Clinical Impression  Pt admitted with above diagnosis and presents to PT with functional limitations due to deficits listed below (See PT problem list). Pt needs skilled PT to maximize independence and safety to allow discharge to ST-SNF. Pt with decline in functional activity tolerance that will make it very difficult for her to succeed at home since she lives alone and has been performing all ADL's including shopping and driving. Believe with ST-SNF she will eventually be able to return home alone.safely.     Follow Up Recommendations SNF    Equipment Recommendations  None recommended by PT    Recommendations for Other Services       Precautions / Restrictions Precautions Precautions: Fall Restrictions Weight Bearing Restrictions: No      Mobility  Bed Mobility Overal bed mobility: Needs Assistance Bed Mobility: Supine to Sit     Supine to sit: Min assist     General bed mobility comments: Assist to elevate trunk.  Transfers Overall transfer level: Needs assistance Equipment used: Rolling walker (2 wheeled);None Transfers: Sit to/from Stand Sit to Stand: Min assist         General transfer comment: For balance  Ambulation/Gait Ambulation/Gait assistance: Min assist Ambulation Distance (Feet): 175 Feet Assistive device: Rolling walker (2 wheeled);1 person hand held assist Gait Pattern/deviations: Step-through pattern;Decreased stride length;Drifts right/left   Gait velocity interpretation: <1.8 ft/sec, indicative of risk for recurrent falls General Gait Details: Pt with unsteady gait when not using assistive device.   Stairs            Wheelchair Mobility    Modified Rankin (Stroke Patients Only)       Balance Overall balance assessment: Needs  assistance Sitting-balance support: No upper extremity supported;Feet supported Sitting balance-Leahy Scale: Normal     Standing balance support: No upper extremity supported;During functional activity Standing balance-Leahy Scale: Fair                               Pertinent Vitals/Pain Pain Assessment: No/denies pain    Home Living Family/patient expects to be discharged to:: Skilled nursing facility Living Arrangements: Alone   Type of Home: House       Home Layout: One level Home Equipment: Cane - single point;Walker - 2 wheels      Prior Function Level of Independence: Independent         Comments: Drives.     Hand Dominance        Extremity/Trunk Assessment   Upper Extremity Assessment: Generalized weakness           Lower Extremity Assessment: Generalized weakness         Communication   Communication: No difficulties  Cognition Arousal/Alertness: Awake/alert Behavior During Therapy: WFL for tasks assessed/performed Overall Cognitive Status: Within Functional Limits for tasks assessed                      General Comments      Exercises        Assessment/Plan    PT Assessment Patient needs continued PT services  PT Diagnosis Generalized weakness;Difficulty walking   PT Problem List Decreased strength;Decreased activity tolerance;Decreased balance;Decreased mobility  PT Treatment Interventions DME instruction;Gait training;Stair training;Functional mobility training;Therapeutic activities;Therapeutic exercise;Balance training;Patient/family education  PT Goals (Current goals can be found in the Care Plan section) Acute Rehab PT Goals Patient Stated Goal: Return home after short term rehab PT Goal Formulation: With patient Time For Goal Achievement: 12/06/15 Potential to Achieve Goals: Good    Frequency Min 2X/week   Barriers to discharge Decreased caregiver support Lives alone    Co-evaluation                End of Session   Activity Tolerance: Patient tolerated treatment well Patient left: in chair;with call bell/phone within reach Nurse Communication: Mobility status         Time: [id number] PT Time Calculation (min) (ACUTE ONLY): 14 min   Charges:   PT Evaluation $PT Eval Low Complexity: 1 Procedure     PT G Codes:        Alexa Mcdonald 12-14-2015, 6:08 PM [facility] PT [phone]

## 2015-11-29 NOTE — Progress Notes (Addendum)
      [address]       [place] [address]             [phone]        2 Days Post-Op Procedure(s) (LRB): TRANSCATHETER AORTIC VALVE REPLACEMENT, TRANSFEMORAL (N/A) TRANSESOPHAGEAL ECHOCARDIOGRAM (TEE) (N/A)  Subjective: Patient just waking up. She states her breathing is better. She also states she "is not quite ready for discharge yet".  Objective: Vital signs in last 24 hours: Temp:  [97.8 F (36.6 C)-98.6 F (37 C)] 98.4 F (36.9 C) (04/27 0516) Pulse Rate:  [51-96] 51 (04/27 0516) Cardiac Rhythm:  [-] Atrial fibrillation (04/27 0037) Resp:  [18-25] 18 (04/27 0516) BP: (128-156)/(58-79) 136/67 mmHg (04/27 0516) SpO2:  [96 %-99 %] 98 % (04/27 0516) Weight:  [136 lb 8 oz (61.916 kg)-136 lb 11 oz (62 kg)] 136 lb 8 oz (61.916 kg) (04/27 0516)   Current Weight  11/29/15 136 lb 8 oz (61.916 kg)      Intake/Output from previous day: 04/26 0701 - 04/27 0700 In: 870 [P.O.:720; I.V.:100; IV Piggyback:50] Out: -    Physical Exam:  Cardiovascular:IRRR, no murmur Pulmonary: Clear to auscultation bilaterally; no rales, wheezes, or rhonchi. Abdomen: Soft, non tender, bowel sounds present. Extremities: No lower extremity edema. Wounds: Left groin wound is clean and dry.  Ecchymosis.  Lab Results: CBC: Recent Labs  11/27/15 2035 11/27/15 2040 11/28/15 0415  WBC 10.3  --  9.5  HGB 9.6* 9.9* 9.6*  HCT 29.7* 29.0* 29.4*  PLT 184  --  170   BMET:  Recent Labs  11/26/15 1511  11/27/15 2040 11/28/15 0415  NA 140  < > 138 137  K 4.2  < > 4.1 3.9  CL 106  < > 104 106  CO2 21*  --   --  23  GLUCOSE 133*  < > 186* 131*  BUN 21*  < > 18 14  CREATININE 1.18*  < > 1.00 1.16*  CALCIUM 9.0  --   --  8.1*  < > = values in this interval not displayed.  PT/INR:  Lab Results  Component Value Date   INR 1.65* 11/27/2015   INR 1.39 11/26/2015   INR 2.5 11/12/2015   ABG:  INR: Will add last result for INR, ABG once components are confirmed Will  add last 4 CBG results once components are confirmed  Assessment/Plan:  1. CV - A fib and hypertensive this am. Is being enrolled in randomizing trial (warfarin vs Savaysa)-management per cardiology. Await echo results 2.  Pulmonary - On room air. 3.  Acute blood loss anemia - H and H yesterday stable at 9.6 and 29.4. 4. Acute on chronic diastolic heart failure-on Lasix 20 mg daily. 5. CKD stage III-creatinine yesterday 1.16  Bertrum Helmstetter MPA-C 11/29/2015,7:23 AM

## 2015-11-29 NOTE — Progress Notes (Signed)
    Subjective:  Doing well today. Feels much better and denies CP or shortness of breath  Objective:  Vital Signs in the last 24 hours: Temp:  [97.8 F (36.6 C)-98.6 F (37 C)] 98.3 F (36.8 C) (04/27 1323) Pulse Rate:  [51-69] 62 (04/27 1323) Resp:  [18-20] 18 (04/27 1323) BP: (134-159)/(58-97) 159/97 mmHg (04/27 1323) SpO2:  [96 %-100 %] 100 % (04/27 1323) Weight:  [136 lb 8 oz (61.916 kg)] 136 lb 8 oz (61.916 kg) (04/27 0516)  Intake/Output from previous day: 04/26 0701 - 04/27 0700 In: 870 [P.O.:720; I.V.:100; IV Piggyback:50] Out: -   Physical Exam: Pt is alert and oriented, NAD HEENT: normal Neck: JVP - normal Lungs: CTA bilaterally CV: irregular Abd: soft, NT, Positive BS Ext: no C/C/E, left groin ecchymoses stable, right groin clear Skin: warm/dry no rash  Lab Results:  Recent Labs  11/27/15 2035 11/27/15 2040 11/28/15 0415  WBC 10.3  --  9.5  HGB 9.6* 9.9* 9.6*  PLT 184  --  170    Recent Labs  11/26/15 1511  11/27/15 2040 11/28/15 0415  NA 140  < > 138 137  K 4.2  < > 4.1 3.9  CL 106  < > 104 106  CO2 21*  --   --  23  GLUCOSE 133*  < > 186* 131*  BUN 21*  < > 18 14  CREATININE 1.18*  < > 1.00 1.16*  < > = values in this interval not displayed. No results for input(s): TROPONINI in the last 72 hours.  Invalid input(s): CK, MB  Cardiac Studies: 2D Echo: Study Conclusions  - Left ventricle: The cavity size was normal. There was moderate  concentric hypertrophy. Systolic function was normal. The  estimated ejection fraction was in the range of 50% to 55%.  Hypokinesis of the anteroseptal myocardium consistent with LBBB  morphology. - Aortic valve: A 2.3 cm Edwards Sapien 3 THV percutanous aortic  valve was present. There was mild, perivalvular regurgitation in  the region adjacent to the main pulmonary artery. Peak velocity  (S): 244 cm/s. Mean gradient (S): 12 mm Hg. Valve area (VTI):  0.84 cm^2. Valve area (Vmax): 0.77 cm^2.  Valve area (Vmean): 0.82  cm^2. Regurgitation pressure half-time: 544 ms. - Mitral valve: Severely calcified annulus. Mobility of the  posterior leaflet was restricted to the point of immobility. Mean  gradient (D): 3 mm Hg. Valve area by continuity equation (using  LVOT flow): 1.23 cm^2. - Left atrium: The atrium was severely dilated. - Right ventricle: The cavity size was normal. Wall thickness was  normal. Systolic function was mildly reduced. - Right atrium: The atrium was mildly dilated. - Tricuspid valve: There was moderate regurgitation. - Pulmonary arteries: Systolic pressure was severely increased. PA  peak pressure: 63 mm Hg (S).  Tele: Atrial fibrillation, personally reviewed  Assessment/Plan:  1. Acute on chronic diastolic heart failure, NYHA III, now POD#2 from TAVR. Resume ACE, continue lasix  2. S/P TAVR POD #2 - doing well - echo reviewed. Mild paravalvular regurgitation noted with normal transvalvular gradients.     3. Post-op blood loss anemia, mild, stable.   4. CKD III: stable labs  5. AFIB: rhythm stable. Randomized per the TAVI-AF Trial, starting oral anticoagulation today.  Dispo: lives alone. Likely stable for discharge tomorrow. Doing well with cardiac rehab. Will need ST-SNF for rehab. Pt and family prefer [facility]   [name], M.D. 11/29/2015, 2:04 PM

## 2015-11-30 DIAGNOSIS — I5033 Acute on chronic diastolic (congestive) heart failure: Secondary | ICD-10-CM | POA: Diagnosis not present

## 2015-11-30 DIAGNOSIS — R5381 Other malaise: Secondary | ICD-10-CM | POA: Diagnosis not present

## 2015-11-30 DIAGNOSIS — R262 Difficulty in walking, not elsewhere classified: Secondary | ICD-10-CM | POA: Diagnosis not present

## 2015-11-30 DIAGNOSIS — N183 Chronic kidney disease, stage 3 (moderate): Secondary | ICD-10-CM | POA: Diagnosis not present

## 2015-11-30 DIAGNOSIS — K5901 Slow transit constipation: Secondary | ICD-10-CM | POA: Diagnosis not present

## 2015-11-30 DIAGNOSIS — E1122 Type 2 diabetes mellitus with diabetic chronic kidney disease: Secondary | ICD-10-CM | POA: Diagnosis not present

## 2015-11-30 DIAGNOSIS — Z954 Presence of other heart-valve replacement: Secondary | ICD-10-CM

## 2015-11-30 DIAGNOSIS — I083 Combined rheumatic disorders of mitral, aortic and tricuspid valves: Secondary | ICD-10-CM | POA: Diagnosis not present

## 2015-11-30 DIAGNOSIS — E0590 Thyrotoxicosis, unspecified without thyrotoxic crisis or storm: Secondary | ICD-10-CM | POA: Diagnosis not present

## 2015-11-30 DIAGNOSIS — I481 Persistent atrial fibrillation: Secondary | ICD-10-CM | POA: Diagnosis not present

## 2015-11-30 DIAGNOSIS — I5032 Chronic diastolic (congestive) heart failure: Secondary | ICD-10-CM | POA: Diagnosis not present

## 2015-11-30 DIAGNOSIS — K5900 Constipation, unspecified: Secondary | ICD-10-CM | POA: Diagnosis not present

## 2015-11-30 DIAGNOSIS — I350 Nonrheumatic aortic (valve) stenosis: Secondary | ICD-10-CM | POA: Diagnosis not present

## 2015-11-30 DIAGNOSIS — I272 Other secondary pulmonary hypertension: Secondary | ICD-10-CM | POA: Diagnosis not present

## 2015-11-30 DIAGNOSIS — M6281 Muscle weakness (generalized): Secondary | ICD-10-CM | POA: Diagnosis not present

## 2015-11-30 DIAGNOSIS — D62 Acute posthemorrhagic anemia: Secondary | ICD-10-CM | POA: Diagnosis not present

## 2015-11-30 DIAGNOSIS — I10 Essential (primary) hypertension: Secondary | ICD-10-CM | POA: Diagnosis not present

## 2015-11-30 DIAGNOSIS — Z5189 Encounter for other specified aftercare: Secondary | ICD-10-CM | POA: Diagnosis not present

## 2015-11-30 DIAGNOSIS — E785 Hyperlipidemia, unspecified: Secondary | ICD-10-CM | POA: Diagnosis not present

## 2015-11-30 LAB — TYPE AND SCREEN
ABO/RH(D): O POS
Antibody Screen: NEGATIVE
Unit division: 0
Unit division: 0

## 2015-11-30 MED ORDER — STUDY - INVESTIGATIONAL MEDICATION: Status: DC

## 2015-11-30 MED ORDER — TRAMADOL HCL 50 MG PO TABS: 50.0000 mg | ORAL_TABLET | ORAL | Status: DC | PRN

## 2015-11-30 MED ORDER — FUROSEMIDE 20 MG PO TABS: 20.0000 mg | ORAL_TABLET | Freq: Every day | ORAL | Status: DC

## 2015-11-30 MED ORDER — ASPIRIN 81 MG PO CHEW: 81.0000 mg | CHEWABLE_TABLET | Freq: Every day | ORAL | Status: DC

## 2015-11-30 NOTE — Clinical Social Work Placement (Signed)
   CLINICAL SOCIAL WORK PLACEMENT  NOTE  Date:  11/30/2015  Patient Details  Name: Alexa Mcdonald MRN: [id number] Date of Birth: 1920-12-10  Clinical Social Work is seeking post-discharge placement for this patient at the [place] level of care (*CSW will initial, date and re-position this form in  chart as items are completed):  Yes   Patient/family provided with [place] Work Department's list of facilities offering this level of care within the geographic area requested by the patient (or if unable, by the patient's family).  Yes   Patient/family informed of their freedom to choose among providers that offer the needed level of care, that participate in Medicare, Medicaid or managed care program needed by the patient, have an available bed and are willing to accept the patient.  Yes   Patient/family informed of [facility]'s ownership interest in [facility] and [facility], as well as of the fact that they are under no obligation to receive care at these facilities.  PASRR submitted to EDS on 11/29/15     PASRR number received on 11/29/15     Existing PASRR number confirmed on       FL2 transmitted to all facilities in geographic area requested by pt/family on       FL2 transmitted to all facilities within larger geographic area on       Patient informed that his/her managed care company has contracts with or will negotiate with certain facilities, including the following:        Yes   Patient/family informed of bed offers received.  Patient chooses bed at [facility]     Physician recommends and patient chooses bed at      Patient to be transferred to [facility] on 11/30/15.  Patient to be transferred to facility by Patient's son     Patient family notified on 11/30/15 of transfer.  Name of family member notified:  Delorse Lek     PHYSICIAN Please prepare priority discharge summary, including medications, Please  sign FL2, Please prepare prescriptions     Additional Comment:  Per MD patient is ready to discharge to [facility] . RN, patient, patient's family, and facility notified of discharge. RN given phone number for report and transport packet is on patient's chart. CSW spoke with patient's son he will provide patient transportation to [place]. CSW signing off.   _______________________________________________ Samule Dry, LCSW 11/30/2015, 3:54 PM

## 2015-11-30 NOTE — Progress Notes (Signed)
      [address]       [place] [address]             [phone]        3 Days Post-Op Procedure(s) (LRB): TRANSCATHETER AORTIC VALVE REPLACEMENT, TRANSFEMORAL (N/A) TRANSESOPHAGEAL ECHOCARDIOGRAM (TEE) (N/A)  Subjective: Patient sitting in chair. No specific complaints. She tells me she is being discharged later today.  Objective: Vital signs in last 24 hours: Temp:  [98.1 F (36.7 C)-98.6 F (37 C)] 98.6 F (37 C) (04/28 1353) Pulse Rate:  [51-62] 51 (04/28 1353) Cardiac Rhythm:  [-] Atrial flutter (04/28 0700) Resp:  [18] 18 (04/28 1353) BP: (131-169)/(47-69) 131/47 mmHg (04/28 1353) SpO2:  [94 %-98 %] 98 % (04/28 1353) Weight:  [136 lb 1.6 oz (61.735 kg)] 136 lb 1.6 oz (61.735 kg) (04/28 0344)   Current Weight  11/30/15 136 lb 1.6 oz (61.735 kg)      Intake/Output from previous day: 04/27 0701 - 04/28 0700 In: 720 [P.O.:720] Out: -    Physical Exam:  Cardiovascular:RRR, no murmur Pulmonary: Clear to auscultation bilaterally; no rales, wheezes, or rhonchi. Abdomen: Soft, non tender, bowel sounds present. Extremities: No lower extremity edema. Wounds: Left groin wound is clean and dry.  Ecchymosis.  Lab Results: CBC:  Recent Labs  11/27/15 2035 11/27/15 2040 11/28/15 0415  WBC 10.3  --  9.5  HGB 9.6* 9.9* 9.6*  HCT 29.7* 29.0* 29.4*  PLT 184  --  170   BMET:   Recent Labs  11/27/15 2040 11/28/15 0415  NA 138 137  K 4.1 3.9  CL 104 106  CO2  --  23  GLUCOSE 186* 131*  BUN 18 14  CREATININE 1.00 1.16*  CALCIUM  --  8.1*    PT/INR:  Lab Results  Component Value Date   INR 1.44 11/29/2015   INR 1.65* 11/27/2015   INR 1.39 11/26/2015   ABG:  INR: Will add last result for INR, ABG once components are confirmed Will add last 4 CBG results once components are confirmed  Assessment/Plan:  1. CV - A fib and hypertensive this am. Is being enrolled in randomizing trial (warfarin vs Savaysa). Cardiology following.  INR 1.44 this am. 2.  Pulmonary - On room air. 3.  Acute blood loss anemia - H and H yesterday stable at 9.6 and 29.4. 4. Acute on chronic diastolic heart failure-on Lasix 20 mg daily. 5. CKD stage III-last creatinine stable at 1.16 6. Hopefully, to SNF today  Skylan Lara MPA-C 11/30/2015,2:23 PM

## 2015-11-30 NOTE — Care Management Important Message (Signed)
Important Message  Patient Details  Name: Alexa Mcdonald MRN: [id number] Date of Birth: [birth year]   Medicare Important Message Given:  Yes    Dawayne Patricia, RN 11/30/2015, 11:29 AM

## 2015-11-30 NOTE — Progress Notes (Signed)
SUBJECTIVE: No chest pain or SOB  BP 154/58 mmHg  Pulse 62  Temp(Src) 98.4 F (36.9 C) (Oral)  Resp 18  Ht 5' 0.5" (1.537 m)  Wt 136 lb 1.6 oz (61.735 kg)  BMI 26.13 kg/m2  SpO2 98%  Intake/Output Summary (Last 24 hours) at 11/30/15 [id number] Last data filed at 11/29/15 1630  Gross per 24 hour  Intake    480 ml  Output      0 ml  Net    480 ml    PHYSICAL EXAM General: Well developed, well nourished, in no acute distress. Alert and oriented x 3.  Psych:  Good affect, responds appropriately Neck: No JVD. No masses noted.  Lungs: Clear bilaterally with no wheezes or rhonci noted.  Heart: RRR with no murmurs noted. Abdomen: Bowel sounds are present. Soft, non-tender.  Extremities: No lower extremity edema.   LABS: Basic Metabolic Panel:  Recent Labs  11/27/15 2035 11/27/15 2040 11/28/15 0415  NA  --  138 137  K  --  4.1 3.9  CL  --  104 106  CO2  --   --  23  GLUCOSE  --  186* 131*  BUN  --  18 14  CREATININE 1.06* 1.00 1.16*  CALCIUM  --   --  8.1*  MG 2.4  --  2.5*   CBC:  Recent Labs  11/27/15 2035 11/27/15 2040 11/28/15 0415  WBC 10.3  --  9.5  HGB 9.6* 9.9* 9.6*  HCT 29.7* 29.0* 29.4*  MCV 88.4  --  88.3  PLT 184  --  170    Current Meds: . aspirin  81 mg Oral Daily  . ENVISAGE TAVI - AF Study - edoxaban 30mg  ([id number])  30 mg Oral Daily  . furosemide  20 mg Oral Daily  . methimazole  7.5 mg Oral Daily  . pantoprazole  40 mg Oral Daily  . pravastatin  10 mg Oral QHS    2D Echo: Study Conclusions  - Left ventricle: The cavity size was normal. There was moderate  concentric hypertrophy. Systolic function was normal. The  estimated ejection fraction was in the range of 50% to 55%.  Hypokinesis of the anteroseptal myocardium consistent with LBBB  morphology. - Aortic valve: A 2.3 cm Edwards Sapien 3 THV percutanous aortic  valve was present. There was mild, perivalvular regurgitation in  the region adjacent to the main  pulmonary artery. Peak velocity  (S): 244 cm/s. Mean gradient (S): 12 mm Hg. Valve area (VTI):  0.84 cm^2. Valve area (Vmax): 0.77 cm^2. Valve area (Vmean): 0.82  cm^2. Regurgitation pressure half-time: 544 ms. - Mitral valve: Severely calcified annulus. Mobility of the  posterior leaflet was restricted to the point of immobility. Mean  gradient (D): 3 mm Hg. Valve area by continuity equation (using  LVOT flow): 1.23 cm^2. - Left atrium: The atrium was severely dilated. - Right ventricle: The cavity size was normal. Wall thickness was  normal. Systolic function was mildly reduced. - Right atrium: The atrium was mildly dilated. - Tricuspid valve: There was moderate regurgitation. - Pulmonary arteries: Systolic pressure was severely increased. PA  peak pressure: 63 mm Hg (S).  Tele: Atrial fibrillation, personally reviewed  Assessment/Plan:   1. Acute on chronic diastolic heart failure, NYHA III: volume status is ok. Continue lasix  2. Severe Aortic stenosis: S/P TAVR POD #3. She is doing well. Echo with mild paravalvular regurgitation noted with normal transvalvular gradients. She is on ASA and  study drug. (Randomized per the TAVI-AF Trial, starting oral anticoagulation today)  3. AFIB: rhythm stable.   Dispo: d/c today to SNF  [facility]  4/28/20179:07 AM

## 2015-11-30 NOTE — Consult Note (Signed)
   [facility] Inpatient Consult   11/30/2015  Alexa Mcdonald 05-03-1921 [id number]   Patient screened for potential [place] Management services. Patient is eligible for [place] as a benefit of her Medicare. Chart review  reveals patient's discharge plan is for skilled nursing for rehab there were no identifiable [facility] care management needs at this time. [facility] Care Management services not appropriate at this time.  For questions please contact:   [name], RN BSN [facility] Liaison  [phone] business mobile phone Toll free office [phone]

## 2015-11-30 NOTE — Progress Notes (Signed)
Gave report to nurse at [facility] place. Daughter in law is on her way to get patient. Shirley Muscat 6:29 PM

## 2015-11-30 NOTE — Anesthesia Postprocedure Evaluation (Signed)
Anesthesia Post Note  Patient: Alexa Mcdonald  Procedure(s) Performed: Procedure(s) (LRB): TRANSCATHETER AORTIC VALVE REPLACEMENT, TRANSFEMORAL (N/A) TRANSESOPHAGEAL ECHOCARDIOGRAM (TEE) (N/A)  Patient location during evaluation: SICU Anesthesia Type: General Level of consciousness: awake and alert Pain management: pain level controlled Vital Signs Assessment: post-procedure vital signs reviewed and stable Respiratory status: spontaneous breathing Cardiovascular status: stable Postop Assessment: no signs of nausea or vomiting Anesthetic complications: no    Last Vitals:  Filed Vitals:   11/29/15 2000 11/30/15 0345  BP: 169/69 154/58  Pulse: 58 62  Temp: 36.7 C 36.9 C  Resp: 18 18    Last Pain:  Filed Vitals:   11/30/15 0348  PainSc: 0-No pain                 Demetria Lightsey,JAMES TERRILL

## 2015-11-30 NOTE — Progress Notes (Signed)
Pt had a 2.23 sec pause. She is asymptomatic and resting comfortably. No distress noted. MD has been notified. Will continue to monitor closely.

## 2015-11-30 NOTE — Discharge Summary (Signed)
Discharge Summary    Patient ID: Alexa Mcdonald,  MRN: [id number], DOB/AGE: [birth year] 82 y.o.  Admit date: 11/27/2015 Discharge date: 11/30/2015  Primary Care Provider: Jonathon Jordan A Primary Cardiologist: Dr. [name]  Discharge Diagnoses    Principal Problem:   S/P TAVR (transcatheter aortic valve replacement) Active Problems:   Aortic stenosis, severe   Essential hypertension   CKD (chronic kidney disease), stage III   GERD (gastroesophageal reflux disease)   Pneumonia   Interstitial lung disease (HCC)   Atrial fibrillation, persistent (HCC)   Mitral regurgitation   Severe aortic stenosis   History of Present Illness     Alexa Mcdonald is a 82 y.o. female with past medical history of HTN, PAF (on Coumadin), Stage 3 CKD, chronic diastolic CHF, and severe AS who presented for TAVR on 11/27/2015.  Hospital Course     Consultants: None  The procedure was performed by Dr. [name] and Dr. [name]. A periprocedural TEE showed her native valve was severely thickened and calcified with severely restricted leaflet opening.There was moderate aortic regurgitations and critical aortic stenosis with peak gradient 104 mmHg and mean gradient 60 mmHg. A 23 mm Particia Lather TAVR valve was successfully deployed and post-deployment, there was peak gradient 6 mmHg and mean gradient 3 mmHg. She tolerated the procedure well and no complications were noted.  The following morning, she denied any chest pain or palpitations. She ambulated 150 ft with minimal assistance. A repeat echocardiogram showed mild, perivalvular regurgitation in the region adjacent to the main pulmonary artery. Peak velocity(S): 244 cm/s. Mean gradient (S): 12 mm Hg. Valve area (VTI):0.84 cm^2. Valve area (Vmax): 0.77 cm^2. Valve area (Vmean): 0.82 cm^2. Regurgitation pressure half-time: 544 ms. CBC showed a stable Hgb of 9.6 (consistent with values on 11/27/2015).   On 11/29/2015, she was randomized per the TAVI-AF Trial,  and started oral anticoagulation. She ambulated another 350 ft without difficulty.   She continued to improve on 11/30/2015. She was last examined by Dr. Angelena Form and deemed stable for discharge for SNF ([facility]). She will continue on ASA and her randomized control oral anticoagulant. Follow-Up with Dr. [name] along with a repeat echocardiogram has been arranged for 12/24/2015. _____________  Discharge Vitals Blood pressure 131/47, pulse 51, temperature 98.6 F (37 C), temperature source Oral, resp. rate 18, height 5' 0.5" (1.537 m), weight 136 lb 1.6 oz (61.735 kg), SpO2 98 %.  Filed Weights   11/28/15 1300 11/29/15 0516 11/30/15 0344  Weight: 136 lb 11 oz (62 kg) 136 lb 8 oz (61.916 kg) 136 lb 1.6 oz (61.735 kg)    Labs & Radiologic Studies     CBC  Recent Labs  11/27/15 2035 11/27/15 2040 11/28/15 0415  WBC 10.3  --  9.5  HGB 9.6* 9.9* 9.6*  HCT 29.7* 29.0* 29.4*  MCV 88.4  --  88.3  PLT 184  --  [id number]   Basic Metabolic Panel  Recent Labs  11/27/15 2035 11/27/15 2040 11/28/15 0415  NA  --  138 137  K  --  4.1 3.9  CL  --  104 106  CO2  --   --  23  GLUCOSE  --  186* 131*  BUN  --  18 14  CREATININE 1.06* 1.00 1.16*  CALCIUM  --   --  8.1*  MG 2.4  --  2.5*    Dg Chest 2 View: 11/26/2015  CLINICAL DATA:  Preop for TAVR. EXAM: CHEST  2 VIEW COMPARISON:  10/30/2014 FINDINGS: Moderate thoracic spondylosis. Patient rotated right on the frontal radiograph. Cardiomegaly. Tracheal deviation to the right secondary to left-sided thyroid enlargement. No pleural effusion or pneumothorax. Lower lobe predominant interstitial thickening. No lobar consolidation. No overt congestive failure. Mild volume loss in both lung bases. IMPRESSION: Cardiomegaly with chronic interstitial lung disease, as on prior CT. No convincing evidence of acute superimposed process. Electronically Signed   By: Abigail Miyamoto M.D.   On: 11/26/2015 15:45   Dg Chest Port 1 View: 11/28/2015  CLINICAL DATA:   Atelectasis, congestive heart failure. EXAM: PORTABLE CHEST 1 VIEW COMPARISON:  11/27/2015. FINDINGS: Right IJ central line tip projects over the SVC. Heart size stable. Thoracic aorta is calcified. Lungs are low in volume with mild residual bibasilar airspace opacification. Overall aeration has improved bilaterally. No definite pleural fluid. IMPRESSION: Resolved pulmonary edema with mild residual bibasilar atelectasis. Electronically Signed   By: Lorin Picket M.D.   On: 11/28/2015 08:07   Dg Chest Port 1 View: 11/27/2015  CLINICAL DATA:  Postop TAVR EXAM: PORTABLE CHEST 1 VIEW COMPARISON:  11/26/2015 FINDINGS: Normal heart size. Aortic atherosclerosis noted. Aortic valve prosthesis is noted. There is mild to moderate pulmonary edema, increased from previous exam. IMPRESSION: 1. Pulmonary edema. Electronically Signed   By: Kerby Moors M.D.   On: 11/27/2015 14:28    Diagnostic Studies/Procedures    TAVR Procedure: 11/27/2015  DETAILS OF THE OPERATIVE PROCEDURE  See Complete note of Dr [name] for full details of the operative procedure.   PERIPHERAL ACCESS:   Using the modified Seldinger technique, femoral arterial and venous access was obtained with placement of 6 Fr sheaths on the left side. It was difficult to access the left femoral artery and initially a wire could not be passed. Manual pressure was held and a second attempt was successful.   TRANSFEMORAL ACCESS:  A micropuncture technique is used to access the right femoral artery under fluoroscopic guidance. Femoral angiography is performed to verify access in the common femoral artery. 2 Perclose devices are deployed at 10' and 2' positions to 'PreClose' the femoral artery. An 8 French sheath is placed and then an Amplatz Superstiff wire is advanced through the sheath. This is changed out for a 14 French transfemoral E-Sheath after progressively dilating over the Superstiff wire.   PROCEDURE COMPLETION:  The sheath was removed and  femoral artery closure is performed using the 2 previously deployed Perclose devices. Protamine was administered once femoral arterial repair was complete. The temporary pacemaker, pigtail catheters and femoral sheaths were removed with manual pressure used for hemostasis.   The patient tolerated the procedure well and is transported to the surgical intensive care in stable condition. There were no immediate intraoperative complications. All sponge instrument and needle counts are verified correct at completion of the operation.   The patient received a total of 45 mL of intravenous contrast during the procedure (two 20 cc aortic root injections and one 5 cc femoral angiogram).   Study Conclusions - Left ventricle: Wall thickness was increased in a pattern of  moderate LVH. Systolic function was vigorous. The estimated  ejection fraction was in the range of 60% to 65%. Wall motion was  normal; there were no regional wall motion abnormalities. - Aortic valve: Cusp separation was severely reduced. There was  critical stenosis. There was moderate regurgitation. - Mitral valve: There was severe regurgitation. - Left atrium: No evidence of thrombus in the atrial cavity or  appendage. No evidence of thrombus in the atrial  cavity or  appendage. - Right atrium: No evidence of thrombus in the atrial cavity or  appendage. - Atrial septum: No defect or patent foramen ovale was identified. - Tricuspid valve: There was moderate regurgitation. - Pulmonary arteries: Systolic pressure was severely increased. PA  peak pressure: 59 mm Hg (S).  Impressions: - This was a periprocedural TEE during the TAVR procedure.   Native valve was severely thickened and calcified with severely  restricted leaflet opening.  There was moderate aortic regurgitations and critical aortic  stenosis with peak gradient 104 mmHg and mean gradient 60 mmHg,  these gradients are underestimated as performed off  axis.   A 23 mm Particia Lather TAVR valve was successfully deployed.   Post deployment there was peak gradient 6 mmHg and mean gradient  3 mmHg.  There was no central aortic regurgitation and only trivial  paravalvular leak.  Post-Procedure Echocardiogram: 11/29/2015 Study Conclusions  - Left ventricle: The cavity size was normal. There was moderate  concentric hypertrophy. Systolic function was normal. The  estimated ejection fraction was in the range of 50% to 55%.  Hypokinesis of the anteroseptal myocardium consistent with LBBB  morphology. - Aortic valve: A 2.3 cm Edwards Sapien 3 THV percutanous aortic  valve was present. There was mild, perivalvular regurgitation in  the region adjacent to the main pulmonary artery. Peak velocity  (S): 244 cm/s. Mean gradient (S): 12 mm Hg. Valve area (VTI):  0.84 cm^2. Valve area (Vmax): 0.77 cm^2. Valve area (Vmean): 0.82  cm^2. Regurgitation pressure half-time: 544 ms. - Mitral valve: Severely calcified annulus. Mobility of the  posterior leaflet was restricted to the point of immobility. Mean  gradient (D): 3 mm Hg. Valve area by continuity equation (using  LVOT flow): 1.23 cm^2. - Left atrium: The atrium was severely dilated. - Right ventricle: The cavity size was normal. Wall thickness was  normal. Systolic function was mildly reduced. - Right atrium: The atrium was mildly dilated. - Tricuspid valve: There was moderate regurgitation. - Pulmonary arteries: Systolic pressure was severely increased. PA  peak pressure: 63 mm Hg (S).  Disposition   Pt is being discharged home today in good condition.  Follow-up Plans & Appointments    Follow-up Information    Follow up with [facility] .   Specialty:  Skilled Nursing Facility   Contact information:   [place] [phone]      Follow up with [name], MD On 12/24/2015.   Specialty:  Cardiology   Why:   Cardiology Follow-Up Echocardiogram and Appointment on 12/24/2015 at 10:30AM.   Contact information:   [id number] [address] [phone]      Discharge Instructions    Diet - low sodium heart healthy    Complete by:  As directed      Increase activity slowly    Complete by:  As directed            Discharge Medications   Current Discharge Medication List    START taking these medications   Details  aspirin 81 MG chewable tablet Chew 1 tablet (81 mg total) by mouth daily.    furosemide (LASIX) 20 MG tablet Take 1 tablet (20 mg total) by mouth daily. Qty: 30 tablet    Investigational - Study Medication Additional Study Details: Take as Prescribed by Study    traMADol (ULTRAM) 50 MG tablet Take 1-2 tablets (50-100 mg total) by mouth every 4 (four) hours  as needed for moderate pain. Qty: 30 tablet, Refills: 0      CONTINUE these medications which have NOT CHANGED   Details  acetaminophen (TYLENOL) 500 MG tablet Take 500 mg by mouth every 6 (six) hours as needed for moderate pain.    Calcium Carb-Cholecalciferol (CALCIUM 600/VITAMIN D3 PO) Take 1 tablet by mouth 2 (two) times daily.    lisinopril (PRINIVIL,ZESTRIL) 20 MG tablet Take 1 tablet (20 mg total) by mouth 2 (two) times daily. Qty: 60 tablet, Refills: 11    methimazole (TAPAZOLE) 5 MG tablet Take 7.5 mg by mouth daily.     Multiple Vitamins-Minerals (OCUVITE PRESERVISION PO) Take 1 capsule by mouth daily.     pravastatin (PRAVACHOL) 10 MG tablet Take 10 mg by mouth at bedtime.           Allergies Allergies  Allergen Reactions  . Amoxicillin Other (See Comments)    tongue felt bruised.  . Celebrex [Celecoxib] Other (See Comments)    Mouth sores     Outstanding Labs/Studies   None  Duration of Discharge Encounter   Greater than 30 minutes including physician time.  Signed, Erma Heritage, PA-C 11/30/2015, 2:47 PM

## 2015-11-30 NOTE — Progress Notes (Signed)
CARDIAC REHAB PHASE I   PRE:  Rate/Rhythm: 50 afib  BP:  Supine:   Sitting: 131/60  Standing:    SaO2: 96%RA  MODE:  Ambulation: 410 ft   POST:  Rate/Rhythm: 68 afib  BP:  Supine:   Sitting: 147/58  Standing:    SaO2: 97%RA 0900-0932 Pt walked 410 ft with rolling walker and minimal asst. Stopped a couple of times to rest. Encouraged walking as tolerated with asst. Discussed CRP 2 and will refer to [facility]. Left low sodium diets and encouraged to watch sodium. Stated neighbor has walker if she needs it after Rehab. Stood at sink as pt brushed her teeth independently.   Graylon Good, RN BSN  11/30/2015 9:28 AM

## 2015-11-30 NOTE — Care Management Note (Signed)
Case Management Note Previous CM note initiated by Elenor Quinones RN, CM  Patient Details  Name: Alexa Mcdonald MRN: [id number] Date of Birth: 10/07/1920  Subjective/Objective:       S/p TAVR             Action/Plan:  Pt is independent from home alone, son serves as support system.  CM will continue to monitor for disposition needs   Expected Discharge Date:     11/30/15             Expected Discharge Plan:  Home/Self Care  In-House Referral:  Clinical Social Work  Discharge planning Services  CM Consult  Post Acute Care Choice:    Choice offered to:     DME Arranged:    DME Agency:     HH Arranged:    [place] Agency:     Status of Service:  Completed, signed off  Medicare Important Message Given:  Yes Date Medicare IM Given:    Medicare IM give by:    Date Additional Medicare IM Given:    Additional Medicare Important Message give by:     If discussed at [place] of Stay Meetings, dates discussed:    Discharge Disposition: skilled facility   Additional Comments:  11/30/15- 1300- Marvetta Gibbons RN, BSN- pt ready for d/c today- plan for SNF- CSW following for placement needs.  Dawayne Patricia, RN 11/30/2015, 1:50 PM

## 2015-12-03 ENCOUNTER — Encounter: Payer: Self-pay | Admitting: Adult Health

## 2015-12-03 ENCOUNTER — Non-Acute Institutional Stay (SKILLED_NURSING_FACILITY): Payer: Medicare Other | Admitting: Adult Health

## 2015-12-03 DIAGNOSIS — D62 Acute posthemorrhagic anemia: Secondary | ICD-10-CM | POA: Diagnosis not present

## 2015-12-03 DIAGNOSIS — I10 Essential (primary) hypertension: Secondary | ICD-10-CM | POA: Diagnosis not present

## 2015-12-03 DIAGNOSIS — I5032 Chronic diastolic (congestive) heart failure: Secondary | ICD-10-CM

## 2015-12-03 DIAGNOSIS — N183 Chronic kidney disease, stage 3 unspecified: Secondary | ICD-10-CM

## 2015-12-03 DIAGNOSIS — E785 Hyperlipidemia, unspecified: Secondary | ICD-10-CM

## 2015-12-03 DIAGNOSIS — K5901 Slow transit constipation: Secondary | ICD-10-CM | POA: Diagnosis not present

## 2015-12-03 DIAGNOSIS — R5381 Other malaise: Secondary | ICD-10-CM

## 2015-12-03 DIAGNOSIS — I350 Nonrheumatic aortic (valve) stenosis: Secondary | ICD-10-CM | POA: Diagnosis not present

## 2015-12-03 DIAGNOSIS — E0590 Thyrotoxicosis, unspecified without thyrotoxic crisis or storm: Secondary | ICD-10-CM

## 2015-12-03 NOTE — Progress Notes (Signed)
Patient ID: Alexa Mcdonald, female   DOB: Feb 22, 1921, 82 y.o.   MRN: [id number]    DATE:  12/03/2015   MRN:  [id number]  BIRTHDAY: September 13, 1920  Facility:  [facility]ing Home Location:  [place] [facility] [place] Room Number: 706-P  LEVEL OF CARE:  [phone])  Contact Information    Name [place] Son [phone]  [phone]   [place], [place] [phone]     [place], [place]   [phone]       Code Status History    Date Active Date Inactive Code Status Order ID Comments User Context   10/31/2015 11:59 AM 10/31/2015  5:24 PM Full Code [id number]  [name], MD Inpatient   12/12/2013  7:18 PM 12/15/2013  6:17 PM Full Code [id number]  Shanda Howells, MD ED       Chief Complaint  Patient presents with  . Hospitalization Follow-up    HISTORY OF PRESENT ILLNESS:  This is a 82 year old female who has been admitted to [facility] on 11/30/15 from [facility]. She has PMH of hypertension, PAF (on Coumadin), stage 3 CKD, chronic diastolic CHF and severe AS. She had TAVR on 11/27/15. She has been admitted for a short-term rehabilitation.    PAST MEDICAL HISTORY:  Past Medical History  Diagnosis Date  . Mediastinal goiter      recurrent s/p resection 1988  . CKD (chronic kidney disease), stage III   . Osteopenia   . Osteoporosis   . Vitamin D deficiency   . Paroxysmal atrial fibrillation (HCC)     a. CHA2DS2VASc = 5-->coumadin;  b. previously on amio->d/c'd 11/2014 secondary to concern for possible amio lung.  . Essential hypertension   . LBBB (left bundle branch block)   . Severe aortic stenosis   . Hx of orthostatic hypotension   . Colon, diverticulosis   . Tachycardia-bradycardia ([place])   . Diabetes mellitus     diet controlled  . Esophageal reflux   . GERD (gastroesophageal reflux disease)   . Mitral regurgitation   . 1St degree AV block   . Shortness of breath dyspnea     for last 3 weeks -  with any activity  . HOH (hard of hearing)     wears bilateral hearing aids  . 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    23 mm Edwards Sapien 3 transcatheter heart valve placed via percutaneous right transfemoral approach  . Interstitial lung disease ([place])   . Elevated brain natriuretic peptide (BNP) level   . Long term current use of anticoagulant therapy   . Pneumonia   . Hematochezia   . Bradycardia      CURRENT MEDICATIONS: Reviewed  Patient's Medications  New Prescriptions   No medications on file  Previous Medications   ACETAMINOPHEN (TYLENOL) 500 MG TABLET    Take 500 mg by mouth every 6 (six) hours as needed for moderate pain.   ASPIRIN 81 MG CHEWABLE TABLET    Chew 1 tablet (81 mg total) by mouth daily.   ATORVASTATIN (LIPITOR) 10 MG TABLET    Take 10 mg by mouth daily.   CALCIUM CARB-CHOLECALCIFEROL (CALCIUM 600/VITAMIN D3 PO)    Take 1 tablet by mouth 2 (two) times daily.   FUROSEMIDE (LASIX) 20 MG TABLET    Take 1 tablet (20 mg total) by mouth daily.   LISINOPRIL (PRINIVIL,ZESTRIL) 20 MG TABLET    Take 1 tablet (20 mg total) by mouth 2 (two)  times daily.   MAGNESIUM HYDROXIDE (MILK OF MAGNESIA PO)    Take 45 mLs by mouth. QD PRN x2 days per S/O   METHIMAZOLE (TAPAZOLE) 5 MG TABLET    Take 7.5 mg by mouth daily.    MULTIPLE VITAMINS-MINERALS (OCUVITE PRESERVISION PO)    Take 1 capsule by mouth daily.    TRAMADOL (ULTRAM) 50 MG TABLET    Take 1-2 tablets (50-100 mg total) by mouth every 4 (four) hours as needed for moderate pain.  Modified Medications   No medications on file  Discontinued Medications   INVESTIGATIONAL - STUDY MEDICATION    Additional Study Details: Take as Prescribed by Study   PRAVASTATIN (PRAVACHOL) 10 MG TABLET    Take 10 mg by mouth at bedtime.      Allergies  Allergen Reactions  . Amoxicillin Other (See Comments)    tongue felt bruised.  . Celebrex [Celecoxib] Other (See Comments)    Mouth sores     REVIEW OF  SYSTEMS:  GENERAL: no change in appetite, no fatigue, no weight changes, no fever, chills or weakness EYES: Denies change in vision, dry eyes, eye pain, itching or discharge EARS: Denies change in hearing, ringing in ears, or earache NOSE: Denies nasal congestion or epistaxis MOUTH and THROAT: Denies oral discomfort, gingival pain or bleeding, pain from teeth or hoarseness   RESPIRATORY: no cough, SOB, DOE, wheezing, hemoptysis CARDIAC: no chest pain, or palpitations GI: no abdominal pain, diarrhea, heart burn, nausea or vomiting, +constipation GU: Denies dysuria, frequency, hematuria, incontinence, or discharge PSYCHIATRIC: Denies feeling of depression or anxiety. No report of hallucinations, insomnia, paranoia, or agitation   PHYSICAL EXAMINATION  GENERAL APPEARANCE: Well nourished. In no acute distress. Normal body habitus SKIN:  Bilateral lower quadrant laparoscopic site; RLQ site is dry, LLQ site has dressing with dry red blood. HEAD: Normal in size and contour. No evidence of trauma EYES: Lids open and close normally. No blepharitis, entropion or ectropion. PERRL. Conjunctivae are clear and sclerae are white. Lenses are without opacity EARS: Pinnae are normal. Patient hears normal voice tunes of the examiner MOUTH and THROAT: Lips are without lesions. Oral mucosa is moist and without lesions. Tongue is normal in shape, size, and color and without lesions NECK: supple, trachea midline, no neck masses, no thyroid tenderness, no thyromegaly LYMPHATICS: no LAN in the neck, no supraclavicular LAN RESPIRATORY: breathing is even & unlabored, BS CTAB CARDIAC: no murmur,no extra heart sounds, BLE edema 2+ GI: abdomen soft, normal BS, no masses, no tenderness, no hepatomegaly, no splenomegaly EXTREMITIES:  Able to move X 4 extremities PSYCHIATRIC: Alert and oriented X 3. Affect and behavior are appropriate  LABS/RADIOLOGY: Labs reviewed: Basic Metabolic Panel:  Recent Labs   10/17/15 1405 11/26/15 1511  11/27/15 1500 11/27/15 2035 11/27/15 2040 11/28/15 0415  NA 139 140  < > 141  --  138 137  K 4.1 4.2  < > 3.7  --  4.1 3.9  CL 105 106  < > 105  --  104 106  CO2 26 21*  --   --   --   --  23  GLUCOSE 150* 133*  < > 150*  --  186* 131*  BUN 24 21*  < > 20  --  18 14  CREATININE 1.10* 1.18*  < > 1.00 1.06* 1.00 1.16*  CALCIUM 8.9 9.0  --   --   --   --  8.1*  MG  --   --   --   --  2.4  --  2.5*  < > = values in this interval not displayed. Liver Function Tests:  Recent Labs  11/26/15 1511  AST 16  ALT 14  ALKPHOS 68  BILITOT 0.9  PROT 6.6  ALBUMIN 3.5   CBC:  Recent Labs  11/27/15 1500 11/27/15 2035 11/27/15 2040 11/28/15 0415  WBC 8.7 10.3  --  9.5  HGB 9.6*  9.9* 9.6* 9.9* 9.6*  HCT 30.1*  29.0* 29.7* 29.0* 29.4*  MCV 88.5 88.4  --  88.3  PLT 191 184  --  170   CBG:  Recent Labs  11/28/15 0414 11/28/15 0755 11/28/15 1211  GLUCAP 126* 117* 166*      Dg Chest 2 View  11/26/2015  CLINICAL DATA:  Preop for TAVR. EXAM: CHEST  2 VIEW COMPARISON:  10/30/2014 FINDINGS: Moderate thoracic spondylosis. Patient rotated right on the frontal radiograph. Cardiomegaly. Tracheal deviation to the right secondary to left-sided thyroid enlargement. No pleural effusion or pneumothorax. Lower lobe predominant interstitial thickening. No lobar consolidation. No overt congestive failure. Mild volume loss in both lung bases. IMPRESSION: Cardiomegaly with chronic interstitial lung disease, as on prior CT. No convincing evidence of acute superimposed process. Electronically Signed   By: Abigail Miyamoto M.D.   On: 11/26/2015 15:45   Dg Chest Port 1 View  11/28/2015  CLINICAL DATA:  Atelectasis, congestive heart failure. EXAM: PORTABLE CHEST 1 VIEW COMPARISON:  11/27/2015. FINDINGS: Right IJ central line tip projects over the SVC. Heart size stable. Thoracic aorta is calcified. Lungs are low in volume with mild residual bibasilar airspace opacification.  Overall aeration has improved bilaterally. No definite pleural fluid. IMPRESSION: Resolved pulmonary edema with mild residual bibasilar atelectasis. Electronically Signed   By: Lorin Picket M.D.   On: 11/28/2015 08:07   Dg Chest Port 1 View  11/27/2015  CLINICAL DATA:  Postop TAVR EXAM: PORTABLE CHEST 1 VIEW COMPARISON:  11/26/2015 FINDINGS: Normal heart size. Aortic atherosclerosis noted. Aortic valve prosthesis is noted. There is mild to moderate pulmonary edema, increased from previous exam. IMPRESSION: 1. Pulmonary edema. Electronically Signed   By: Kerby Moors M.D.   On: 11/27/2015 14:28    ASSESSMENT/PLAN:  Physical deconditioning - for PT and OT  Severe aortic stenosis S/P TAVR - or rehabilitation; continue aspirin 81 mg 1 tab by mouth daily; Tylenol 500 mg 1 tab by mouth every 6 hours when necessary and tramadol 50 mg 1-2 tabs by mouth every 4 hours when necessary for pain; follow-up with Dr. [name], cardiology, on [id number]  Chronic diastolic heart failure - continue Lasix 20 mg 1 tab daily; weigh daily  Hypertension - continue Lisinopril 20 mg 1 tab daily  Hyperlipidemia - continue Pravachol 10 mg 1 tab by mouth daily  Hyperthyroidism - continue Tapazole 5 mg take 1 1/2 tab = 7.5 mg daily; check TSH  CKD stage 3 - creatinine 1.16; check BMP  Anemia, acute blood loss - hgb 9.6; check CBC  Constipation - start Senna-S 2 tabs BID and Miralax 17 gm daily     Goals of care:  Short-term rehabilitation    [facility], NP [facility] [phone]

## 2015-12-04 ENCOUNTER — Encounter: Payer: Self-pay | Admitting: Internal Medicine

## 2015-12-04 ENCOUNTER — Non-Acute Institutional Stay (SKILLED_NURSING_FACILITY): Payer: Medicare Other | Admitting: Internal Medicine

## 2015-12-04 DIAGNOSIS — E0590 Thyrotoxicosis, unspecified without thyrotoxic crisis or storm: Secondary | ICD-10-CM | POA: Diagnosis not present

## 2015-12-04 DIAGNOSIS — K5900 Constipation, unspecified: Secondary | ICD-10-CM | POA: Diagnosis not present

## 2015-12-04 DIAGNOSIS — N183 Chronic kidney disease, stage 3 unspecified: Secondary | ICD-10-CM

## 2015-12-04 DIAGNOSIS — E785 Hyperlipidemia, unspecified: Secondary | ICD-10-CM

## 2015-12-04 DIAGNOSIS — I10 Essential (primary) hypertension: Secondary | ICD-10-CM

## 2015-12-04 DIAGNOSIS — I481 Persistent atrial fibrillation: Secondary | ICD-10-CM | POA: Diagnosis not present

## 2015-12-04 DIAGNOSIS — D62 Acute posthemorrhagic anemia: Secondary | ICD-10-CM | POA: Diagnosis not present

## 2015-12-04 DIAGNOSIS — I350 Nonrheumatic aortic (valve) stenosis: Secondary | ICD-10-CM | POA: Diagnosis not present

## 2015-12-04 DIAGNOSIS — R5381 Other malaise: Secondary | ICD-10-CM

## 2015-12-04 DIAGNOSIS — I4819 Other persistent atrial fibrillation: Secondary | ICD-10-CM

## 2015-12-04 NOTE — Progress Notes (Signed)
LOCATION: [facility]  PCP: [name], MD   Code Status: Full Code  Goals of care: Advanced Directive information Advanced Directives 11/27/2015  Does patient have an advance directive? Yes  Type of Paramedic of [place];Living will  Copy of advanced directive(s) in chart? -       Extended Emergency Contact Information Primary Emergency Contact: Backs,[name] Address: [address]          [place] Phone: [phone] Mobile Phone: [phone] Relation: Son Secondary Emergency Contact: Cephas Darby [place] of [place] Phone: [phone] Relation: Grandaughter   Allergies  Allergen Reactions  . Amoxicillin Other (See Comments)    tongue felt bruised.  . Celebrex [Celecoxib] Other (See Comments)    Mouth sores    Chief Complaint  Patient presents with  . New Admit To SNF    New Admission     HPI:  Patient is a 82 y.o. female seen today for short term rehabilitation post hospital admission from 11/27/15-11/30/15 with severe Aortic stenosis. She underwent TAVR on 11/27/15. Se has PMH of afib, ckd stage 3, HTN, chronic diastolic chf among others. She is seen in her room today.   Review of Systems:  Constitutional: Negative for fever, chills. Energy slowly returning.  HENT: Negative for headache, congestion, nasal discharge, hearing loss, sore throat, difficulty swallowing.   Eyes: Negative for blurred vision, double vision and discharge.  Respiratory: Negative for cough, shortness of breath and wheezing. Improved breathing post surgery.   Cardiovascular: Negative for chest pain, palpitations. Positive for occasional leg swelling.  Gastrointestinal: Negative for heartburn, nausea, vomiting, abdominal pain, loss of appetite. Last bowel movement was yesterday.  Genitourinary: Negative for dysuria and flank pain.  Musculoskeletal: Negative for back pain, fall in the facility.  Skin:  Negative for itching, rash.  Neurological: Negative for dizziness. Psychiatric/Behavioral: Negative for depression   Past Medical History  Diagnosis Date  . Mediastinal goiter      recurrent s/p resection 1988  . CKD (chronic kidney disease), stage III   . Osteopenia   . Osteoporosis   . Vitamin D deficiency   . Paroxysmal atrial fibrillation (HCC)     a. CHA2DS2VASc = 5-->coumadin;  b. previously on amio->d/c'd 11/2014 secondary to concern for possible amio lung.  . Essential hypertension   . LBBB (left bundle branch block)   . Severe aortic stenosis   . Hx of orthostatic hypotension   . Colon, diverticulosis   . Tachycardia-bradycardia ([place])   . Diabetes mellitus     diet controlled  . Esophageal reflux   . GERD (gastroesophageal reflux disease)   . Mitral regurgitation   . 1St degree AV block   . Shortness of breath dyspnea     for last 3 weeks - with any activity  . HOH (hard of hearing)     wears bilateral hearing aids  . 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    23 mm Edwards Sapien 3 transcatheter heart valve placed via percutaneous right transfemoral approach  . Interstitial lung disease ([place])   . Elevated brain natriuretic peptide (BNP) level   . Long term current use of anticoagulant therapy   . Pneumonia   . Hematochezia   . Bradycardia    Past Surgical History  Procedure Laterality Date  . Cataract extraction Bilateral   . Carpal tunnel release Bilateral   . Mri  08/18/07    head (diabetes heart study at [facility])  .  Mediastinal removal of a goiter    . Cesarean section      x3  . Cardiac catheterization N/A 10/31/2015    Procedure: Right/Left Heart Cath and Coronary Angiography;  Surgeon: [name], MD;  Location: [facility];  Service: Cardiovascular;  Laterality: N/A;  . Transcatheter aortic valve replacement, transfemoral N/A 11/27/2015    Procedure: TRANSCATHETER AORTIC VALVE REPLACEMENT, TRANSFEMORAL;  Surgeon: [name], MD;  Location: [facility];  Service: Open Heart Surgery;  Laterality: N/A;  . Tee without cardioversion N/A 11/27/2015    Procedure: TRANSESOPHAGEAL ECHOCARDIOGRAM (TEE);  Surgeon: [name], MD;  Location: [facility];  Service: Open Heart Surgery;  Laterality: N/A;   Social History:   reports that she has never smoked. She has never used smokeless tobacco. She reports that she does not drink alcohol or use illicit drugs.  Family History  Problem Relation Age of Onset  . Diabetes Mother   . Cancer - Prostate Father   . Pulmonary embolism Father   . Hypertension Sister   . Diabetes Sister   . Hypertension Brother   . Arrhythmia Brother     afib  . CAD Brother   . Pulmonary fibrosis Brother     Medications:   Medication List       This list is accurate as of: 12/04/15  3:08 PM.  Always use your most recent med list.               acetaminophen 500 MG tablet  Commonly known as:  TYLENOL  Take 500 mg by mouth every 6 (six) hours as needed for moderate pain.     aspirin 81 MG chewable tablet  Chew 1 tablet (81 mg total) by mouth daily.     atorvastatin 10 MG tablet  Commonly known as:  LIPITOR  Take 10 mg by mouth daily.     CALCIUM 600/VITAMIN D3 PO  Take 1 tablet by mouth 2 (two) times daily.     edoxaban 30 MG Tabs tablet  Commonly known as:  SAVAYSA  Take 30 mg by mouth daily.     furosemide 20 MG tablet  Commonly known as:  LASIX  Take 1 tablet (20 mg total) by mouth daily.     lisinopril 20 MG tablet  Commonly known as:  PRINIVIL,ZESTRIL  Take 1 tablet (20 mg total) by mouth 2 (two) times daily.     methimazole 5 MG tablet  Commonly known as:  TAPAZOLE  Take 7.5 mg by mouth daily.     OCUVITE PRESERVISION PO  Take 1 capsule by mouth daily.     polyethylene glycol packet  Commonly known as:  MIRALAX / GLYCOLAX  Take 17 g by mouth daily.     senna 8.6 MG tablet  Commonly known as:  SENOKOT  Take 2 tablets by mouth 2 (two) times daily.      traMADol 50 MG tablet  Commonly known as:  ULTRAM  Take 1-2 tablets (50-100 mg total) by mouth every 4 (four) hours as needed for moderate pain.        Immunizations: Immunization History  Administered Date(s) Administered  . Influenza Split 04/09/2015  . Influenza-Unspecified 05/04/2014  . PPD Test 12/02/2015  . Pneumococcal-Unspecified 08/04/2013  . Tdap 06/06/2012     Physical Exam: Filed Vitals:   12/04/15 1501  BP: 105/89  Pulse: 79  Temp: 98.4 F (36.9 C)  TempSrc: Oral  Resp: 20  Height: 5\' 5"  (1.651 m)  Weight: 136  lb 12.8 oz (62.052 kg)  SpO2: 98%   Body mass index is 22.76 kg/(m^2).  General- elderly female, well built, in no acute distress Head- normocephalic, atraumatic Nose- no maxillary or frontal sinus tenderness, no nasal discharge Throat- moist mucus membrane Eyes- PERRLA, EOMI, no pallor, no icterus, no discharge, normal conjunctiva, normal sclera Neck- no cervical lymphadenopathy Cardiovascular- normal s1,s2, + murmur, trace leg edema Respiratory- bilateral clear to auscultation, no wheeze, no rhonchi, no crackles, no use of accessory muscles Abdomen- bowel sounds present, soft, non tender Musculoskeletal- able to move all 4 extremities, generalized weakness Neurological- alert and oriented to person, place and time Skin- warm and dry, groin incision healing well Psychiatry- normal mood and affect    Labs reviewed: Basic Metabolic Panel:  Recent Labs  10/17/15 1405 11/26/15 1511  11/27/15 1500 11/27/15 2035 11/27/15 2040 11/28/15 11/28/15 0415  NA 139 140  < > 141  --  138 137 137  K 4.1 4.2  < > 3.7  --  4.1  --  3.9  CL 105 106  < > 105  --  104  --  106  CO2 26 21*  --   --   --   --   --  23  GLUCOSE 150* 133*  < > 150*  --  186*  --  131*  BUN 24 21*  < > 20  --  18 14 14   CREATININE 1.10* 1.18*  < > 1.00 1.06* 1.00 1.2* 1.16*  CALCIUM 8.9 9.0  --   --   --   --   --  8.1*  MG  --   --   --   --  2.4  --   --  2.5*  < > =  values in this interval not displayed. Liver Function Tests:  Recent Labs  11/26/15 1511  AST 16  ALT 14  ALKPHOS 68  BILITOT 0.9  PROT 6.6  ALBUMIN 3.5   No results for input(s): LIPASE, AMYLASE in the last 8760 hours. No results for input(s): AMMONIA in the last 8760 hours. CBC:  Recent Labs  11/27/15 1500 11/27/15 2035 11/27/15 2040 11/28/15 11/28/15 0415  WBC 8.7 10.3  --  9.5 9.5  HGB 9.6*  9.9* 9.6* 9.9*  --  9.6*  HCT 30.1*  29.0* 29.7* 29.0*  --  29.4*  MCV 88.5 88.4  --   --  88.3  PLT 191 184  --   --  170   Cardiac Enzymes: No results for input(s): CKTOTAL, CKMB, CKMBINDEX, TROPONINI in the last 8760 hours. BNP: Invalid input(s): POCBNP CBG:  Recent Labs  11/28/15 0414 11/28/15 0755 11/28/15 1211  GLUCAP 126* 117* 166*    Radiological Exams: Dg Chest 2 View  11/26/2015  CLINICAL DATA:  Preop for TAVR. EXAM: CHEST  2 VIEW COMPARISON:  10/30/2014 FINDINGS: Moderate thoracic spondylosis. Patient rotated right on the frontal radiograph. Cardiomegaly. Tracheal deviation to the right secondary to left-sided thyroid enlargement. No pleural effusion or pneumothorax. Lower lobe predominant interstitial thickening. No lobar consolidation. No overt congestive failure. Mild volume loss in both lung bases. IMPRESSION: Cardiomegaly with chronic interstitial lung disease, as on prior CT. No convincing evidence of acute superimposed process. Electronically Signed   By: Abigail Miyamoto M.D.   On: 11/26/2015 15:45   Dg Chest Port 1 View  11/28/2015  CLINICAL DATA:  Atelectasis, congestive heart failure. EXAM: PORTABLE CHEST 1 VIEW COMPARISON:  11/27/2015. FINDINGS: Right IJ central line tip projects over  the SVC. Heart size stable. Thoracic aorta is calcified. Lungs are low in volume with mild residual bibasilar airspace opacification. Overall aeration has improved bilaterally. No definite pleural fluid. IMPRESSION: Resolved pulmonary edema with mild residual bibasilar  atelectasis. Electronically Signed   By: Lorin Picket M.D.   On: 11/28/2015 08:07   Dg Chest Port 1 View  11/27/2015  CLINICAL DATA:  Postop TAVR EXAM: PORTABLE CHEST 1 VIEW COMPARISON:  11/26/2015 FINDINGS: Normal heart size. Aortic atherosclerosis noted. Aortic valve prosthesis is noted. There is mild to moderate pulmonary edema, increased from previous exam. IMPRESSION: 1. Pulmonary edema. Electronically Signed   By: Kerby Moors M.D.   On: 11/27/2015 14:28    Assessment/Plan  Physical deconditioning Will have her work with physical therapy and occupational therapy team to help with gait training and muscle strengthening exercises.fall precautions. Skin care. Encourage to be out of bed.   Severe aortic stenosis  S/P TAVR. continue aspirin 81 mg daily. Has follow up with cardiothoracic surgeon. Continue prn tylenol and tramadol for pain  Blood loss anemia Post op, Monitor cbc  afib Controlled HR. Continue edoxaban 30 mg daily for anticoagulation  Chronic diastolic heart failure euvolemic on exam. continue Lasix 20 mg daily with lisinopril 20 mg daily, monitor bmp and weight  Constipation Stable, continue senna s and miralax  Hypertension continue Lisinopril 20 mg daily and monitor BP  Hyperthyroidism Continue tapazole for now  ckd stage 3 Monitor bmp  Hyperlipidemia  continue Pravachol 10 mg daily   Goals of care: short term rehabilitation   Labs/tests ordered: cbc, cmp  Family/ staff Communication: reviewed care plan with patient, her son and nursing supervisor    Blanchie Serve, MD Internal Medicine [place], [place] [address] Cell Phone (Monday-Friday 8 am - 5 pm): [phone] On Call: [phone] and follow prompts after 5 pm and on weekends Office Phone: [phone] Office Fax: [phone]

## 2015-12-05 ENCOUNTER — Encounter: Payer: Medicare Other | Admitting: Surgery

## 2015-12-07 ENCOUNTER — Other Ambulatory Visit (HOSPITAL_COMMUNITY): Payer: Medicare Other

## 2015-12-13 ENCOUNTER — Encounter: Payer: Self-pay | Admitting: Adult Health

## 2015-12-13 NOTE — Progress Notes (Signed)
Patient ID: Alexa Mcdonald, female   DOB: [birth year], 82 y.o.   MRN: [id number]   This encounter was created in error - please disregard.

## 2015-12-24 ENCOUNTER — Ambulatory Visit (HOSPITAL_COMMUNITY): Payer: Medicare Other | Attending: Cardiology

## 2015-12-24 ENCOUNTER — Other Ambulatory Visit: Payer: Self-pay

## 2015-12-24 ENCOUNTER — Encounter: Payer: Self-pay | Admitting: Cardiovascular Disease

## 2015-12-24 ENCOUNTER — Encounter (INDEPENDENT_AMBULATORY_CARE_PROVIDER_SITE_OTHER): Payer: Medicare Other | Admitting: Pharmacist

## 2015-12-24 ENCOUNTER — Ambulatory Visit (INDEPENDENT_AMBULATORY_CARE_PROVIDER_SITE_OTHER): Payer: Medicare Other | Admitting: Cardiovascular Disease

## 2015-12-24 VITALS — Wt 126.0 lb

## 2015-12-24 VITALS — BP 110/70 | HR 64 | Ht 60.5 in | Wt 126.8 lb

## 2015-12-24 DIAGNOSIS — I081 Rheumatic disorders of both mitral and tricuspid valves: Secondary | ICD-10-CM | POA: Insufficient documentation

## 2015-12-24 DIAGNOSIS — I272 Other secondary pulmonary hypertension: Secondary | ICD-10-CM | POA: Insufficient documentation

## 2015-12-24 DIAGNOSIS — Z954 Presence of other heart-valve replacement: Secondary | ICD-10-CM

## 2015-12-24 DIAGNOSIS — I350 Nonrheumatic aortic (valve) stenosis: Secondary | ICD-10-CM

## 2015-12-24 DIAGNOSIS — Z952 Presence of prosthetic heart valve: Secondary | ICD-10-CM

## 2015-12-24 DIAGNOSIS — I4891 Unspecified atrial fibrillation: Secondary | ICD-10-CM | POA: Diagnosis not present

## 2015-12-24 DIAGNOSIS — I359 Nonrheumatic aortic valve disorder, unspecified: Secondary | ICD-10-CM | POA: Diagnosis not present

## 2015-12-24 DIAGNOSIS — I129 Hypertensive chronic kidney disease with stage 1 through stage 4 chronic kidney disease, or unspecified chronic kidney disease: Secondary | ICD-10-CM | POA: Insufficient documentation

## 2015-12-24 DIAGNOSIS — N189 Chronic kidney disease, unspecified: Secondary | ICD-10-CM | POA: Diagnosis not present

## 2015-12-24 DIAGNOSIS — Z953 Presence of xenogenic heart valve: Secondary | ICD-10-CM | POA: Diagnosis not present

## 2015-12-24 LAB — BASIC METABOLIC PANEL
BUN: 26 mg/dL — ABNORMAL HIGH (ref 7–25)
CO2: 26 mmol/L (ref 20–31)
Calcium: 9.4 mg/dL (ref 8.6–10.4)
Chloride: 102 mmol/L (ref 98–110)
Creat: 1.14 mg/dL — ABNORMAL HIGH (ref 0.60–0.88)
Glucose, Bld: 109 mg/dL — ABNORMAL HIGH (ref 65–99)
Potassium: 4.4 mmol/L (ref 3.5–5.3)
Sodium: 139 mmol/L (ref 135–146)

## 2015-12-24 LAB — CBC
HCT: 37.3 % (ref 35.0–45.0)
Hemoglobin: 12.2 g/dL (ref 11.7–15.5)
MCH: 28.6 pg (ref 27.0–33.0)
MCHC: 32.7 g/dL (ref 32.0–36.0)
MCV: 87.4 fL (ref 80.0–100.0)
MPV: 9.5 fL (ref 7.5–12.5)
Platelets: 202 10*3/uL (ref 140–400)
RBC: 4.27 MIL/uL (ref 3.80–5.10)
RDW: 14.7 % (ref 11.0–15.0)
WBC: 5.8 10*3/uL (ref 3.8–10.8)

## 2015-12-24 LAB — POCT INR: INR: 1.3

## 2015-12-24 MED ORDER — LISINOPRIL 20 MG PO TABS: 20.0000 mg | ORAL_TABLET | Freq: Every day | ORAL | Status: DC

## 2015-12-24 NOTE — Progress Notes (Signed)
Cardiology Office Note Date:  12/24/2015   ID:  Alexa Mcdonald, DOB Jan 30, 1921, MRN [id number]  PCP:  Alexa Coma, MD  Cardiologist:  Alexa Mocha, MD    Chief Complaint  Patient presents with  . Aortic Stenosis    severe. S/P TAVR. Denies cp,sob,lee,or claudication. C/O being light headed     History of Present Illness: Alexa Mcdonald is a 82 y.o. female who presents for 30-day FU after undergoing TAVR 11-27-2015. The patient was treated with a 23 mm Edwards Sapien 3 THV via percutaneous right trasfemoral access. She has a history of hypertension, paroxysmal atrial fibrillation, and stage III chronic kidney disease. She had developed progressive shortness of breath with low-level activity (NYHA functional class III) and was found to have both a critical aortic stenosis and severe mitral regurgitation. Preoperative cardiac catheterization showed mild nonobstructive CAD.  Her hospital course and immediate postoperative period was uncomplicated. Her breathing is dramatically improved since surgery. However, she complains of lightheadedness. This is not positional. She feels that all of the time. She does not experience a sensation of the room spinning. She has not had presyncope or frank syncope. She denies visual changes. She has no focal neurologic symptoms. She has no other complaints today.  Past Medical History  Diagnosis Date  . Mediastinal goiter      recurrent s/p resection 1988  . CKD (chronic kidney disease), stage III   . Osteopenia   . Osteoporosis   . Vitamin D deficiency   . Paroxysmal atrial fibrillation (HCC)     a. CHA2DS2VASc = 5-->coumadin;  b. previously on amio->d/c'd 11/2014 secondary to concern for possible amio lung.  . Essential hypertension   . LBBB (left bundle branch block)   . Severe aortic stenosis   . Hx of orthostatic hypotension   . Colon, diverticulosis   . Tachycardia-bradycardia ([place])   . Diabetes mellitus     diet controlled  .  Esophageal reflux   . GERD (gastroesophageal reflux disease)   . Mitral regurgitation   . 1St degree AV block   . Shortness of breath dyspnea     for last 3 weeks - with any activity  . HOH (hard of hearing)     wears bilateral hearing aids  . 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    23 mm Edwards Sapien 3 transcatheter heart valve placed via percutaneous right transfemoral approach  . Interstitial lung disease ([place])   . Elevated brain natriuretic peptide (BNP) level   . Long term current use of anticoagulant therapy   . Pneumonia   . Hematochezia   . Bradycardia     Past Surgical History  Procedure Laterality Date  . Cataract extraction Bilateral   . Carpal tunnel release Bilateral   . Mri  08/18/07    head (diabetes heart study at [facility])  . Mediastinal removal of a goiter    . Cesarean section      x3  . Cardiac catheterization N/A 10/31/2015    Procedure: Right/Left Heart Cath and Coronary Angiography;  Surgeon: Alexa Mocha, MD;  Location: [facility];  Service: Cardiovascular;  Laterality: N/A;  . Transcatheter aortic valve replacement, transfemoral N/A 11/27/2015    Procedure: TRANSCATHETER AORTIC VALVE REPLACEMENT, TRANSFEMORAL;  Surgeon: Alexa Mocha, MD;  Location: [facility];  Service: Open Heart Surgery;  Laterality: N/A;  . Tee without cardioversion N/A 11/27/2015    Procedure: TRANSESOPHAGEAL ECHOCARDIOGRAM (TEE);  Surgeon: Alexa Mocha, MD;  Location: [facility];  Service: Open Heart Surgery;  Laterality: N/A;    Current Outpatient Prescriptions  Medication Sig Dispense Refill  . acetaminophen (TYLENOL) 500 MG tablet Take 500 mg by mouth every 6 (six) hours as needed for moderate pain.    [name] aspirin 81 MG chewable tablet Chew 1 tablet (81 mg total) by mouth daily.    [name] atorvastatin (LIPITOR) 10 MG tablet Take 10 mg by mouth daily.    . Calcium Carb-Cholecalciferol (CALCIUM 600/VITAMIN D3 PO) Take 1 tablet by mouth 2 (two) times daily.    [name]  edoxaban (SAVAYSA) 30 MG TABS tablet Take 30 mg by mouth daily.    . furosemide (LASIX) 20 MG tablet Take 1 tablet (20 mg total) by mouth daily. 30 tablet   . lisinopril (PRINIVIL,ZESTRIL) 20 MG tablet Take 1 tablet (20 mg total) by mouth daily. 30 tablet 11  . methimazole (TAPAZOLE) 5 MG tablet Take 7.5 mg by mouth daily. Take 1-1/2 tabs to = 7.5 mg    . Multiple Vitamins-Minerals (OCUVITE PRESERVISION PO) Take 1 capsule by mouth daily.     . polyethylene glycol (MIRALAX / GLYCOLAX) packet Take 17 g by mouth daily.    . pravastatin (PRAVACHOL) 10 MG tablet Take 10 mg by mouth daily.    [name] senna (SENOKOT) 8.6 MG tablet Take 2 tablets by mouth 2 (two) times daily.    . traMADol (ULTRAM) 50 MG tablet Take 1-2 tablets (50-100 mg total) by mouth every 4 (four) hours as needed for moderate pain. 30 tablet 0   No current facility-administered medications for this visit.    Allergies:   Amoxicillin and Celebrex   Social History:  The patient  reports that she has never smoked. She has never used smokeless tobacco. She reports that she does not drink alcohol or use illicit drugs.   Family History:  The patient's  family history includes Arrhythmia in her brother; CAD in her brother; Cancer - Prostate in her father; Diabetes in her mother and sister; Hypertension in her brother and sister; Pulmonary embolism in her father; Pulmonary fibrosis in her brother.    ROS:  Please see the history of present illness. All other systems are reviewed and negative.    PHYSICAL EXAM: VS:  BP 110/70 mmHg  Pulse 64  Ht 5' 0.5" (1.537 m)  Wt 126 lb 12.8 oz (57.516 kg)  BMI 24.35 kg/m2 , BMI Body mass index is 24.35 kg/(m^2). GEN: Well nourished, well developed, in no acute distress HEENT: normal Neck: no JVD, no masses. No carotid bruits Cardiac: irregularly irregular with a paradoxical S2 and soft ejection murmur at the RUSB. There is a 2/6 holosystolic murmur at the apex               Respiratory:  clear to  auscultation bilaterally, normal work of breathing GI: soft, nontender, nondistended, + BS MS: no deformity or atrophy Ext: no pretibial edema, pedal pulses 2+= bilaterally Skin: warm and dry, no rash Neuro:  Strength and sensation are intact Psych: euthymic mood, full affect  EKG:  EKG is ordered today. The ekg ordered today shows atrial fibrillation 62 bpm, left bundle branch block, no change from previous  Recent Labs: 11/26/2015: ALT 14 11/28/2015: BUN 14; Creatinine, Ser 1.16*; Hemoglobin 9.6*; Magnesium 2.5*; Platelets 170; Potassium 3.9; Sodium 137   Lipid Panel  No results found for: CHOL, TRIG, HDL, CHOLHDL, VLDL, LDLCALC, LDLDIRECT    Wt Readings from Last 3 Encounters:  12/24/15 126 lb (57.153 kg)  12/24/15  126 lb 12.8 oz (57.516 kg)  12/13/15 134 lb (60.782 kg)     Cardiac Studies Reviewed: 2D Echo: Left ventricle: The cavity size was normal. There was mild concentric hypertrophy. Systolic function was mildly reduced. The estimated ejection fraction was in the range of 45% to 50%. Wall motion was normal; there were no regional wall motion abnormalities. Normal sinus rhythm was absent. The study was not technically sufficient to allow evaluation of LV diastolic dysfunction due to atrial fibrillation. Doppler parameters are consistent with high ventricular filling pressure.  ------------------------------------------------------------------- Aortic valve: S/P TAVR with normal functioning 65mm Edward Sapien bioprosthesis which Is functioning normally Trileaflet; normal thickness leaflets. Mobility was not restricted. Doppler: Transvalvular velocity was within the normal range. There was no stenosis. There was no regurgitation. VTI ratio of LVOT to aortic valve: 0.29. Valve area (VTI): 0.73 cm^2. Indexed valve area (VTI): 0.44 cm^2/m^2. Peak velocity ratio of LVOT to aortic valve: 0.29. Valve area (Vmax): 0.73 cm^2. Indexed valve area (Vmax): 0.44 cm^2/m^2.  Mean velocity ratio of LVOT to aortic valve: 0.31. Valve area (Vmean): 0.78 cm^2. Indexed valve area (Vmean): 0.47 cm^2/m^2.  Mean gradient (S): 15 mm Hg. Peak gradient (S): 27 mm Hg.  ------------------------------------------------------------------- Aorta: Aortic root: The aortic root was normal in size.  ------------------------------------------------------------------- Mitral valve: Calcified annulus. Mobility was not restricted. Doppler: Transvalvular velocity was within the normal range. There was no evidence for stenosis. There was moderate regurgitation. Peak gradient (D): 9 mm Hg.  ------------------------------------------------------------------- Left atrium: The atrium was severely dilated.  ------------------------------------------------------------------- Right ventricle: The cavity size was normal. Wall thickness was normal. Systolic function was normal.  ------------------------------------------------------------------- Ventricular septum: Septal motion showed paradox.  ------------------------------------------------------------------- Pulmonic valve: Structurally normal valve. Cusp separation was normal. Doppler: Transvalvular velocity was within the normal range. There was no evidence for stenosis. There was trivial regurgitation.  ------------------------------------------------------------------- Tricuspid valve: Structurally normal valve. Doppler: Transvalvular velocity was within the normal range. There was mild regurgitation.  ------------------------------------------------------------------- Pulmonary artery: The main pulmonary artery was normal-sized. Systolic pressure was within the normal range.   ASSESSMENT AND PLAN: 1.  Aortic valve disorder s/p TAVR: overall seems to be doing well with NYHA I sx's of chronic diastolic heart failure. I have personally reviewed her echo images. Her aortic valve prosthesis is  functioning normally. She has at least moderate residual mitral regurgitation. I am not sure of the cause of her lightheadedness. Her blood pressure is in the low-normal range and will decrease her lisinopril to 20 mg once daily. Will check baseline labs with a CBC and metabolic panel to rule out anemia or electrolyte disturbances. She will see Dr. [name] in follow-up in about 6 weeks.  Current medicines are reviewed with the patient today.  The patient does not have concerns regarding medicines.  Labs/ tests ordered today include:  Orders Placed This Encounter  Procedures  . Basic Metabolic Panel (BMET)  . CBC  . EKG 12-Lead    Disposition:   FU one year Valve clinic visit with echo at that time  Signed, Alexa Mocha, MD  12/24/2015 2:44 PM    [address], [place], [place]  [address] Phone: [phone]; Fax: [phone]

## 2015-12-24 NOTE — Patient Instructions (Addendum)
Medication Instructions:  Your physician has recommended you make the following change in your medication:  1. DECREASE Lisinopril to 20mg  one tablet by mouth daily  Please review your medications and contact the office in regards to whether you are taking Atorvastatin or Pravastatin.   Labwork: Your physician recommends that you have lab work today: BMP and CBC  Testing/Procedures: No new orders.   Follow-Up: Your physician recommends that you schedule a follow-up appointment in: 6-8 WEEKS with Dr [name]   Any Other Special Instructions Will Be Listed Below (If Applicable).     If you need a refill on your cardiac medications before your next appointment, please call your pharmacy.

## 2015-12-24 NOTE — Progress Notes (Signed)
This encounter was created in error - please disregard.

## 2015-12-26 ENCOUNTER — Other Ambulatory Visit: Payer: Self-pay

## 2016-01-04 ENCOUNTER — Telehealth: Payer: Self-pay | Admitting: Cardiovascular Disease

## 2016-01-04 NOTE — Telephone Encounter (Signed)
I spoke with the pt and since she contacted the office she realized that she actually has not taken her lisinopril for today.  I advised the pt that she needs to go ahead and take this medication for her elevated BP.  The pt continues to complain of the same dizziness that she described at her office visit with Dr [name].  I recommended that she try either meclizine or dramamine OTC to see if this improves her symptoms.  I asked her to contact her PCP for further evaluation.  Pt agreed with plan.

## 2016-01-04 NOTE — Telephone Encounter (Signed)
New message     Pt c/o BP issue: STAT if pt c/o blurred vision, one-sided weakness or slurred speech  1. What are your last 5 BP readings? Taken @1627  b/p [id number] 66-pulse   2. Are you having any other symptoms (ex. Dizziness, headache, blurred vision, passed out)? Dizziness  3. What is your BP issue? The pt took bp pill, but still states she does not feel right, pt feels very light headed when walking, the pt sitting down

## 2016-01-04 NOTE — Telephone Encounter (Signed)
Please see follow up phone call.

## 2016-01-04 NOTE — Telephone Encounter (Signed)
NEW MESSAGE   Pt is calling because she is lightheaded and dizzy pt has not passed out  No SOB no CP no need for medication or refills

## 2016-01-22 ENCOUNTER — Telehealth: Payer: Self-pay

## 2016-01-22 NOTE — Telephone Encounter (Signed)
Patient called in and is complaining of dizziness and would like a call back from nurse. Per patient this is not a new symptom but has caused her concern and she feels she needs something called into her pharmacy and would like call back from nurse since I advised her I can not send in a medication to her pharmacy that would help with dizziness. I also advised her she can call her PCP while waiting for call back from our office.

## 2016-01-22 NOTE — Telephone Encounter (Signed)
Patient calling to report intermittent dizziness since surgery. She st it is no worse than when she talked to Dr. Antionette Char nurse a couple weeks ago, but it is no better, either.  She has no other complaints. Reiterated to patient Dr. Antionette Char nurse's instructions to try meclizine or dramamine. She st she bought [facility] Motion Sickness Relief tablets and takes them as needed. She st that they do not really help much, but she takes them after dizziness onset. Per instructions on the medication, encouraged patient to take one chewable daily for prevention instead.  Offered patient to be evaluated on Monday but she declined. She wishes to wait to see Dr. [name] July 6. She st she will call if symptoms worsen prior to that appointment.

## 2016-01-31 ENCOUNTER — Telehealth: Payer: Self-pay | Admitting: *Deleted

## 2016-01-31 NOTE — Telephone Encounter (Signed)
Patient called to say she has misplaced her study drug.which is Edoxaban. Patient has not taken dose today.  I asked patient if she like to come by and get some replacement drug. She said she would come by in am if does not find drug today. I will call her at 10:00am to check and see if she found study drug.

## 2016-02-01 ENCOUNTER — Telehealth: Payer: Self-pay | Admitting: *Deleted

## 2016-02-01 NOTE — Telephone Encounter (Signed)
I spoke with patient today. She cannot find her study medication which is Edoxaban. She thinks it accidentally got thrown out. I told her I could replace the lost medication. We set it up so her daughter in law Solai Sara would come to hospital and pick up her medications. I instructed patient to make sure she started her medication back today. Patient verbalized understanding. I did give medication to Leda Gauze today and reminded her to have patient take medication today.

## 2016-02-07 ENCOUNTER — Encounter (INDEPENDENT_AMBULATORY_CARE_PROVIDER_SITE_OTHER): Payer: Self-pay

## 2016-02-07 ENCOUNTER — Encounter: Payer: Self-pay | Admitting: Cardiology

## 2016-02-07 ENCOUNTER — Ambulatory Visit (INDEPENDENT_AMBULATORY_CARE_PROVIDER_SITE_OTHER)
Admission: RE | Admit: 2016-02-07 | Discharge: 2016-02-07 | Disposition: A | Discharge status: Home / Self Care | Payer: Medicare Other | Source: Ambulatory Visit | Attending: Cardiology | Admitting: Cardiology

## 2016-02-07 ENCOUNTER — Ambulatory Visit (INDEPENDENT_AMBULATORY_CARE_PROVIDER_SITE_OTHER): Payer: Medicare Other | Admitting: Cardiology

## 2016-02-07 VITALS — BP 122/70 | HR 64 | Ht 60.5 in | Wt 125.0 lb

## 2016-02-07 DIAGNOSIS — I5032 Chronic diastolic (congestive) heart failure: Secondary | ICD-10-CM

## 2016-02-07 DIAGNOSIS — R42 Dizziness and giddiness: Secondary | ICD-10-CM | POA: Diagnosis not present

## 2016-02-07 DIAGNOSIS — I4819 Other persistent atrial fibrillation: Secondary | ICD-10-CM

## 2016-02-07 DIAGNOSIS — I481 Persistent atrial fibrillation: Secondary | ICD-10-CM

## 2016-02-07 DIAGNOSIS — I340 Nonrheumatic mitral (valve) insufficiency: Secondary | ICD-10-CM | POA: Diagnosis not present

## 2016-02-07 DIAGNOSIS — I5033 Acute on chronic diastolic (congestive) heart failure: Secondary | ICD-10-CM | POA: Insufficient documentation

## 2016-02-07 DIAGNOSIS — I10 Essential (primary) hypertension: Secondary | ICD-10-CM

## 2016-02-07 DIAGNOSIS — I350 Nonrheumatic aortic (valve) stenosis: Secondary | ICD-10-CM | POA: Diagnosis not present

## 2016-02-07 HISTORY — DX: Chronic diastolic (congestive) heart failure: I50.32

## 2016-02-07 IMAGING — CT CT HEAD W/O CM
3 series · 15 of 45 positions shown, 18 images · non-contrast
Comparison: None.

CLINICAL DATA: Dizziness for a few weeks. Evaluation for CVA. No
fall.

EXAM:
CT HEAD WITHOUT CONTRAST
TECHNIQUE: Contiguous axial images were obtained from the base of the skull
through the vertex without intravenous contrast.

[Series 2: head 5.0 h37s · axial · 0.43mm/px · z∈[+166,+281]mm · 9 of 28 slices shown, 12 images]
[im 3/28  brain]
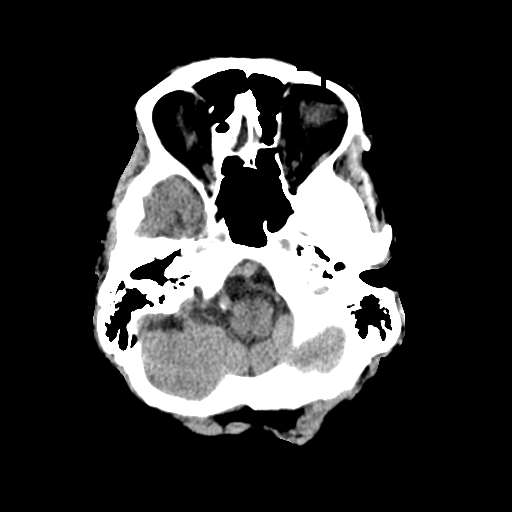
[im 3/28  bone]
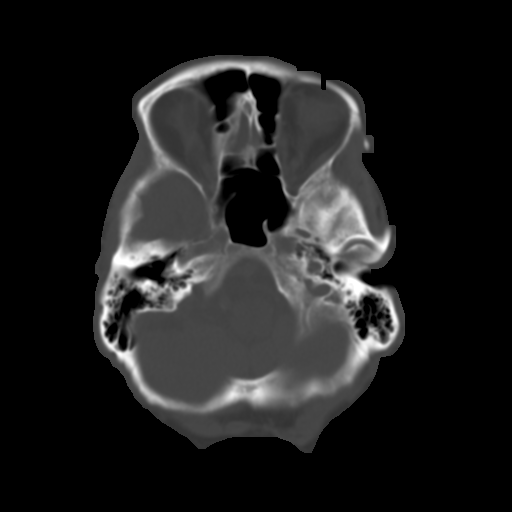
[im 6/28  brain]
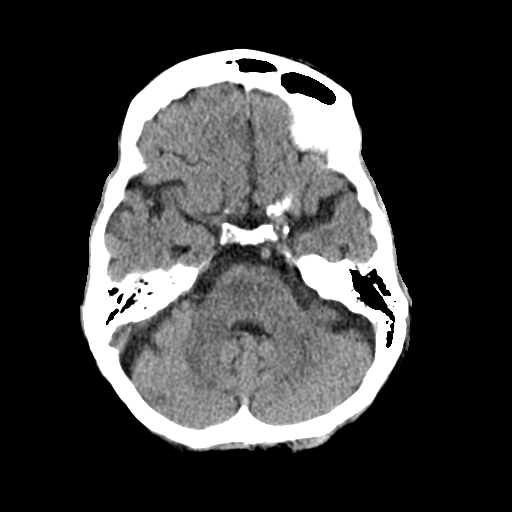
[im 9/28  brain]
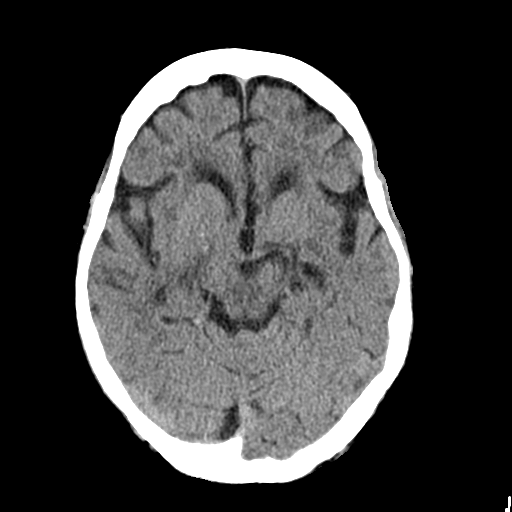
[im 12/28  brain]
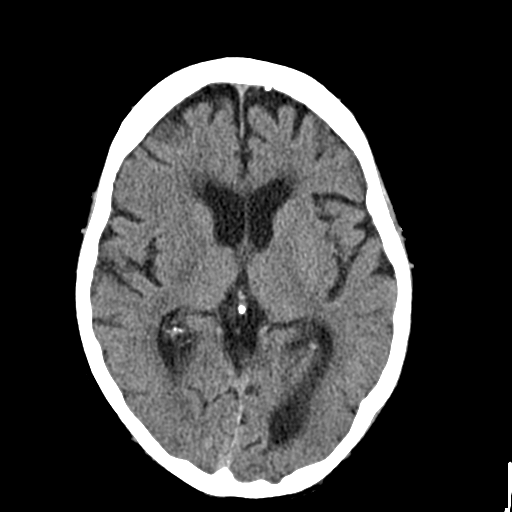
[im 15/28  brain]
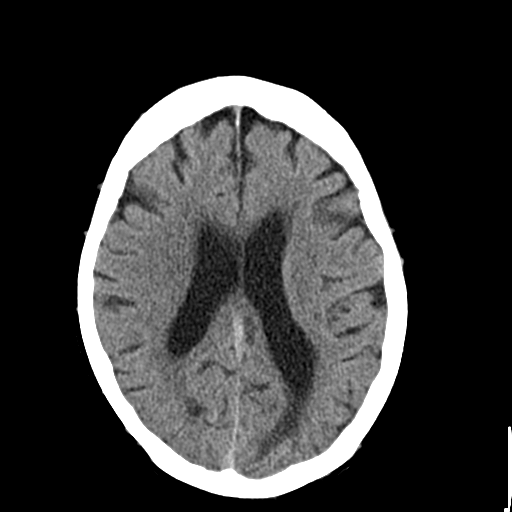
[im 15/28  bone]
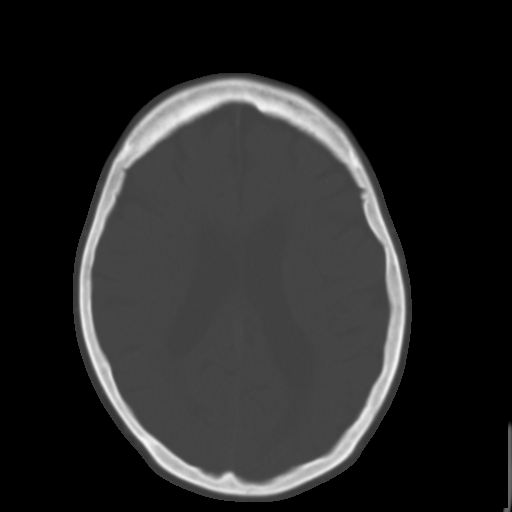
[im 17/28  brain]
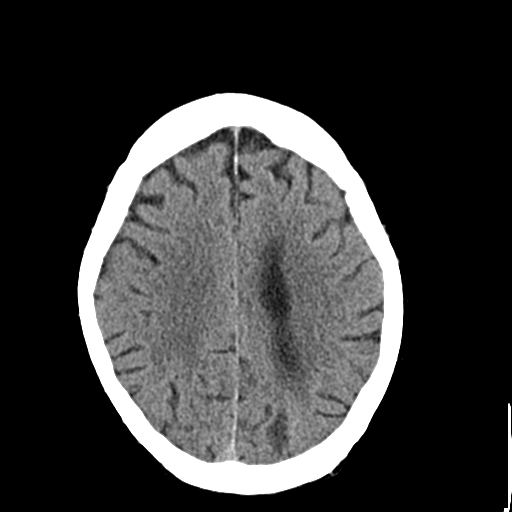
[im 20/28  brain]
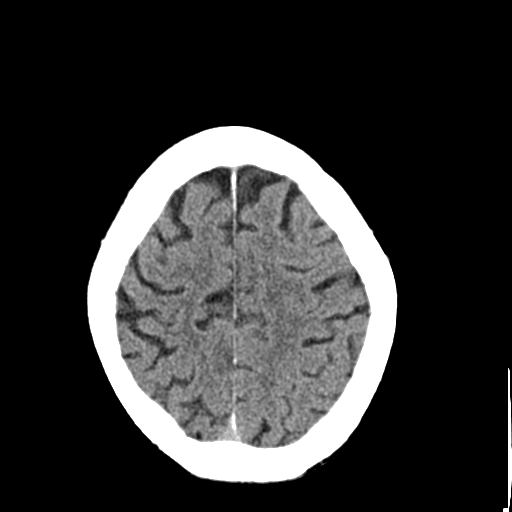
[im 23/28  brain]
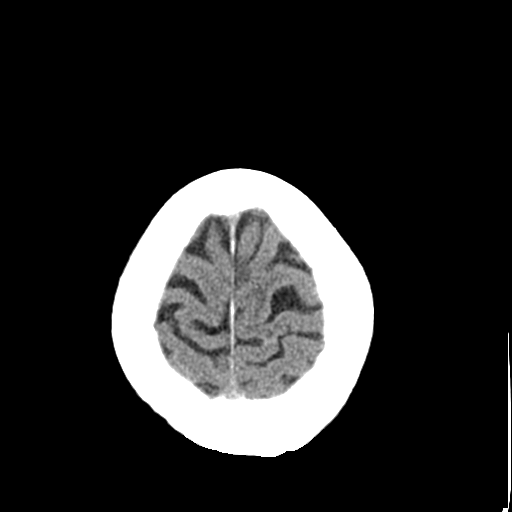
[im 26/28  brain]
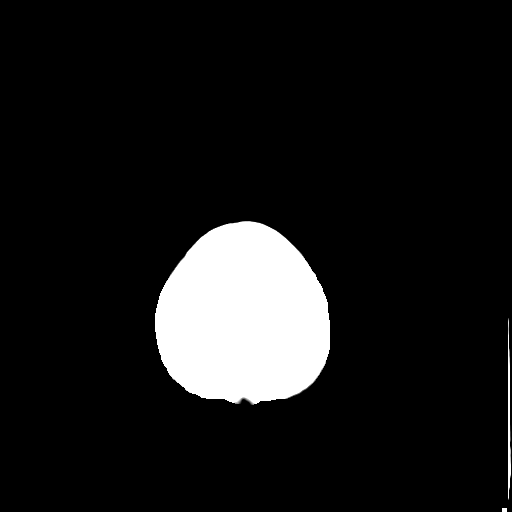
[im 26/28  bone]
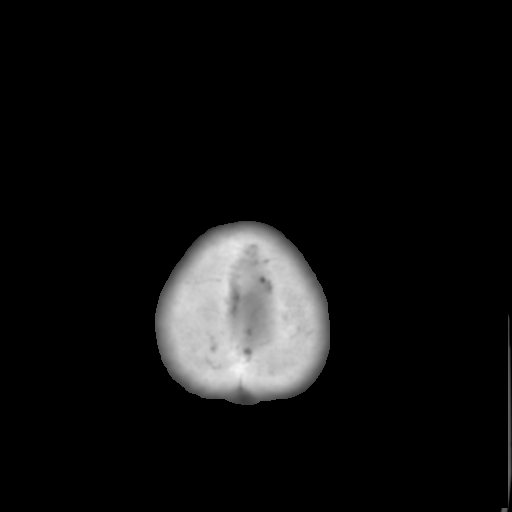

[Series 4: head 3.0 mpr cor · coronal · 0.29mm/px · 3 of 65 slices shown]
[im 22/65  brain]
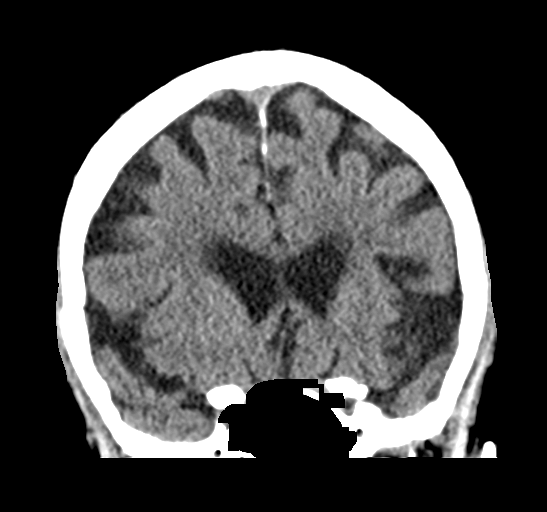
[im 29/65  brain]
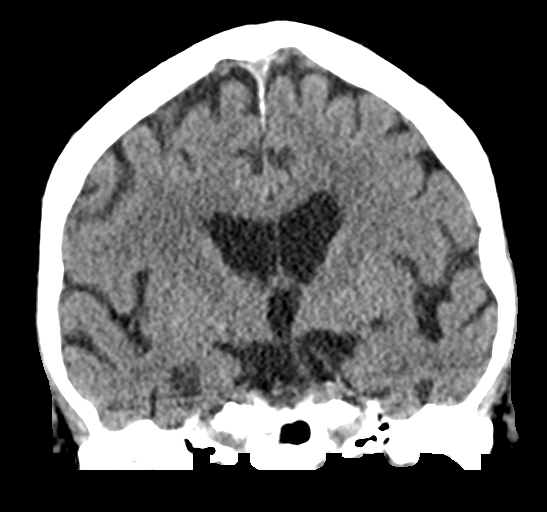
[im 36/65  brain]
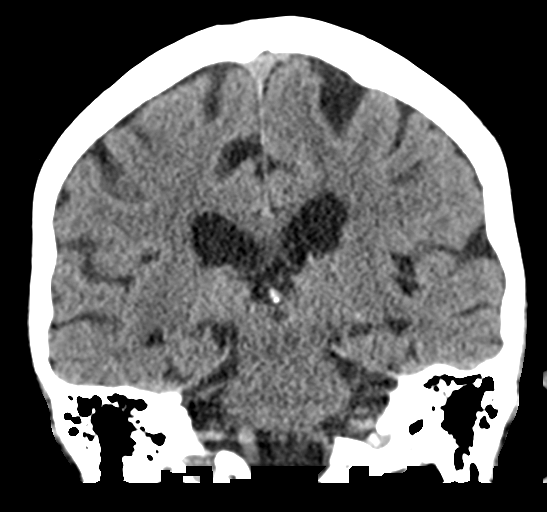

[Series 5: head 3.0 mpr sag · sagittal · 0.29mm/px · 3 of 52 slices shown]
[im 18/52  brain]
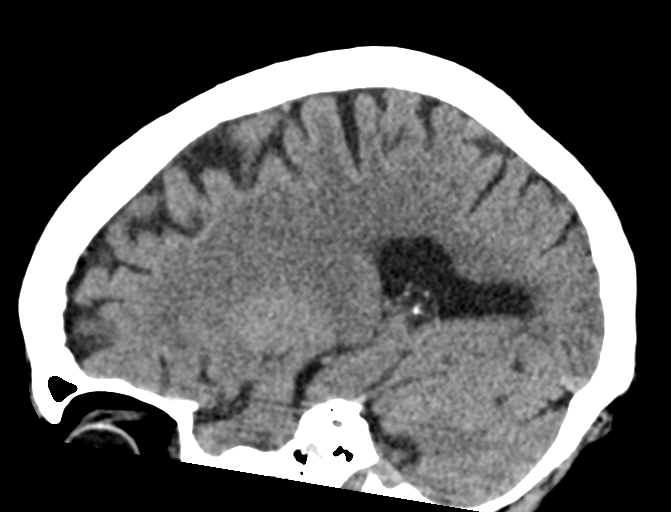
[im 26/52  brain]
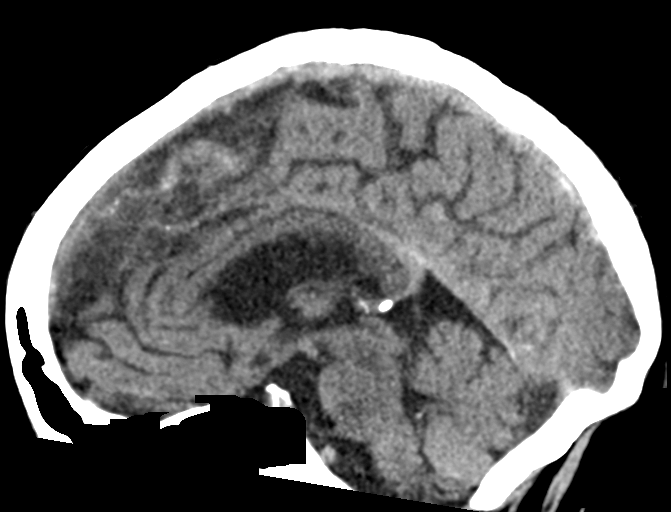
[im 35/52  brain]
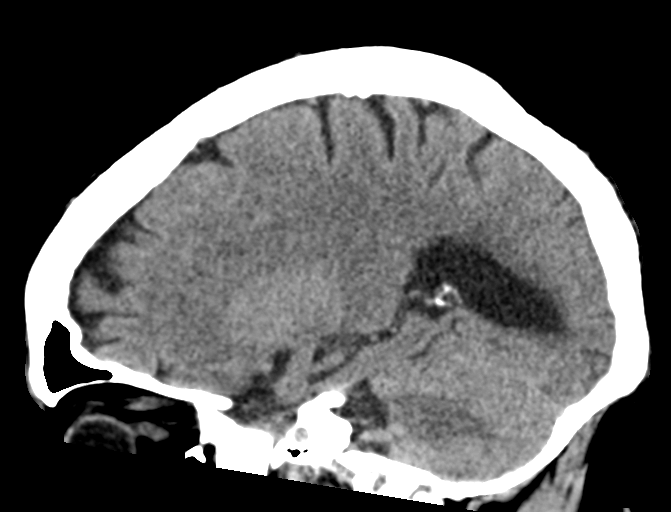

[15 of 45 positions shown; findings below may reference images not displayed]

FINDINGS: Brain Parenchyma: There is mild periventricular hypoattenuation and
a small amount of bilateral basal ganglia mineralization. There is a
somewhat more focal region of hypoattenuation adjacent to the
occipital horn of the left lateral ventricle, likely a chronic
infarct, with slightly asymmetric enlargement of the occipital horn
of the left lateral ventricle, possibly due to underlying left
occipital encephalomalacia. No evidence of acute cortical infarct.
No intraparenchymal hemorrhage. No mass lesion or midline shift.

Ventricles, Sulci and Extra-axial Spaces: There is mild prominence
of the ventricles and sulci compatible with age-related volume loss.
No lobar predominance. No extra-axial collection.

Paranasal Sinuses and Mastoids: No fluid levels or advanced mucosal
thickening.

Orbits: Normal.

Bones and Soft Tissues: Normal.
IMPRESSION: 1. No acute intracranial abnormality.
2. Likely chronic left occipital infarct and mild for age volume
loss without lobar predominance.

## 2016-02-07 NOTE — Progress Notes (Signed)
Cardiology Office Note    Date:  02/07/2016   ID:  ROISE LOFINK, DOB Dec 19, 1920, MRN [id number]  PCP:  [name], MD  Cardiologist:  Fransico Him, MD   Chief Complaint  Patient presents with  . Dizziness    currently a little dizziness w/o reason. dizziness comes and goes whether position change or still.    History of Present Illness:  Alexa Mcdonald is a 82 y.o. female  with a history of severe AS s/p TAVR 11/2015 with 3mm Edwards Sapien 3 THV via percutaneous right transfemoral approach.  She had developed progressive shortness of breath with low-level activity (NYHA functional class III) and was found to have both a critical aortic stenosis and severe mitral regurgitation. Preoperative cardiac catheterization showed mild nonobstructive CAD. She also has HTN, PAF on chronic anticoagulation, chronic diastolic CHF, moderate MR and chronic LE edema.  She saw Dr. [name] in may and was complaining of dizziness although her BP was ok.  Her lisinopril was decreased to 20mg  daily.   She is doing well. Since the TAVR her SOB has dramatically improved.  She denies any chest pain or pressure, SOB, DOE, palpitations, LE edema or syncope. She says that the lightheadedness has persisted.  She says that her head feels "foggy".  This has been occurring ever since her TAVR.  It can occur at any time.  She denies any numbness or weakness in her extremities and no speech issues.     Past Medical History  Diagnosis Date  . Mediastinal goiter      recurrent s/p resection 1988  . CKD (chronic kidney disease), stage III   . Osteopenia   . Osteoporosis   . Vitamin D deficiency   . Paroxysmal atrial fibrillation (HCC)     a. CHA2DS2VASc = 5-->coumadin;  b. previously on amio->d/c'd 11/2014 secondary to concern for possible amio lung.  . Essential hypertension   . LBBB (left bundle branch block)   . Severe aortic stenosis   . Hx of orthostatic hypotension   . Colon, diverticulosis   .  Tachycardia-bradycardia ([place])   . Diabetes mellitus     diet controlled  . Esophageal reflux   . GERD (gastroesophageal reflux disease)   . Mitral regurgitation   . 1St degree AV block   . Shortness of breath dyspnea     for last 3 weeks - with any activity  . HOH (hard of hearing)     wears bilateral hearing aids  . 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    23 mm Edwards Sapien 3 transcatheter heart valve placed via percutaneous right transfemoral approach  . Interstitial lung disease ([place])   . Elevated brain natriuretic peptide (BNP) level   . Long term current use of anticoagulant therapy   . Pneumonia   . Hematochezia   . Bradycardia   . Chronic diastolic heart failure ([place]) 02/07/2016    Past Surgical History  Procedure Laterality Date  . Cataract extraction Bilateral   . Carpal tunnel release Bilateral   . Mri  08/18/07    head (diabetes heart study at [facility])  . Mediastinal removal of a goiter    . Cesarean section      x3  . Cardiac catheterization N/A 10/31/2015    Procedure: Right/Left Heart Cath and Coronary Angiography;  Surgeon: [name], MD;  Location: [facility];  Service: Cardiovascular;  Laterality: N/A;  . Transcatheter aortic valve replacement, transfemoral N/A 11/27/2015  Procedure: TRANSCATHETER AORTIC VALVE REPLACEMENT, TRANSFEMORAL;  Surgeon: [name], MD;  Location: [facility];  Service: Open Heart Surgery;  Laterality: N/A;  . Tee without cardioversion N/A 11/27/2015    Procedure: TRANSESOPHAGEAL ECHOCARDIOGRAM (TEE);  Surgeon: [name], MD;  Location: [facility];  Service: Open Heart Surgery;  Laterality: N/A;    Current Medications: Outpatient Prescriptions Prior to Visit  Medication Sig Dispense Refill  . aspirin 81 MG chewable tablet Chew 1 tablet (81 mg total) by mouth daily.    . Calcium Carb-Cholecalciferol (CALCIUM 600/VITAMIN D3 PO) Take 1 tablet by mouth 2 (two) times daily.    [name] edoxaban (SAVAYSA) 30 MG TABS  tablet Take 30 mg by mouth daily.    . furosemide (LASIX) 20 MG tablet Take 20mg  daily as needed for edema or weigh gain (3 lbs in 24 hours or 5 lbs in 1 week) 30 tablet   . lisinopril (PRINIVIL,ZESTRIL) 20 MG tablet Take 1 tablet (20 mg total) by mouth daily. 30 tablet 11  . methimazole (TAPAZOLE) 5 MG tablet Take 7.5 mg by mouth daily. Take 1-1/2 tabs to = 7.5 mg    . Multiple Vitamins-Minerals (OCUVITE PRESERVISION PO) Take 1 capsule by mouth daily.     . pravastatin (PRAVACHOL) 10 MG tablet Take 10 mg by mouth daily.    [name] senna (SENOKOT) 8.6 MG tablet Take 2 tablets by mouth 2 (two) times daily.    [name] acetaminophen (TYLENOL) 500 MG tablet Take 500 mg by mouth every 6 (six) hours as needed for moderate pain.    . polyethylene glycol (MIRALAX / GLYCOLAX) packet Take 17 g by mouth daily.    . traMADol (ULTRAM) 50 MG tablet Take 1-2 tablets (50-100 mg total) by mouth every 4 (four) hours as needed for moderate pain. (Patient not taking: Reported on 02/07/2016) 30 tablet 0   No facility-administered medications prior to visit.     Allergies:   Amoxicillin and Celebrex   Social History   Social History  . Marital Status: Widowed    Spouse Name: N/A  . Number of Children: 3  . Years of Education: N/A   Occupational History  . retire    Social History Main Topics  . Smoking status: Never Smoker   . Smokeless tobacco: Never Used  . Alcohol Use: No  . Drug Use: No  . Sexual Activity: Not Asked   Other Topics Concern  . None   Social History Narrative   Lives in [place].  Widowed.     Family History:  The patient's family history includes Arrhythmia in her brother; CAD in her brother; Cancer - Prostate in her father; Diabetes in her mother and sister; Hypertension in her brother and sister; Pulmonary embolism in her father; Pulmonary fibrosis in her brother.   ROS:   Please see the history of present illness.    ROS All other systems reviewed and are negative.   PHYSICAL  EXAM:   VS:  BP 122/70 mmHg  Pulse 64  Ht 5' 0.5" (1.537 m)  Wt 125 lb (56.7 kg)  BMI 24.00 kg/m2   GEN: Well nourished, well developed, in no acute distress HEENT: normal Neck: no JVD or masses Cardiac: RRR; no rubs, or gallops,no edema.  Intact distal pulses bilaterally. 2/6 SM at RUSB to LLSB and into carotid arteries Respiratory:  clear to auscultation bilaterally, normal work of breathing GI: soft, nontender, nondistended, + BS MS: no deformity or atrophy Skin: warm and dry, no rash Neuro:  Alert  and Oriented x 3, Strength and sensation are intact Psych: euthymic mood, full affect  Wt Readings from Last 3 Encounters:  02/07/16 125 lb (56.7 kg)  12/24/15 126 lb (57.153 kg)  12/24/15 126 lb 12.8 oz (57.516 kg)      Studies/Labs Reviewed:   EKG:  EKG is not ordered today.   Recent Labs: 11/26/2015: ALT 14 11/28/2015: Magnesium 2.5* 12/24/2015: BUN 26*; Creat 1.14*; Hemoglobin 12.2; Platelets 202; Potassium 4.4; Sodium 139   Lipid Panel No results found for: CHOL, TRIG, HDL, CHOLHDL, VLDL, LDLCALC, LDLDIRECT  Additional studies/ records that were reviewed today include:  none    ASSESSMENT:    1. Severe aortic stenosis   2. Mitral regurgitation   3. Essential hypertension   4. Chronic diastolic heart failure ([place])   5. Atrial fibrillation, persistent ([place])      PLAN:  In order of problems listed above:  1. Severe AS s/p TAVR and doing well.  She has had dramatic improvement in her SOB. Continue ASA.   2. Moderate MR by last echo of ? Etiology. Repeat echo 06/2016 3. HTN - Bp well controlled on exam today.  Continue ACE I. 4. Chronic combined systolic/diastolic CHF (EF [id number] on echo 12/2015) - she appears euvolemic on exam today. Continue ACE I and PRN Lasix.   5. Chronic atrial fibrillation rate controlled.  Continue Edoxaban.   6. Chronic dizziness that is not related to position.  She says that it has been present since after her TAVR.  She has no neurological  deficits. Her symptoms did not improve after her ACE I was decreased.  I will get a non contrasted head CT to make sure she did not have a CVA.      Medication Adjustments/Labs and Tests Ordered: Current medicines are reviewed at length with the patient today.  Concerns regarding medicines are outlined above.  Medication changes, Labs and Tests ordered today are listed in the Patient Instructions below.  There are no Patient Instructions on file for this visit.   Signed, Fransico Him, MD  02/07/2016 2:30 PM    [address], [place], [place]  [address] Phone: [phone]; Fax: [phone]

## 2016-02-07 NOTE — Patient Instructions (Signed)
Medication Instructions:  Your physician recommends that you continue on your current medications as directed. Please refer to the Current Medication list given to you today.   Labwork: None  Testing/Procedures: Your physician has requested that you have an echocardiogram in November, 2017. Echocardiography is a painless test that uses sound waves to create images of your heart. It provides your doctor with information about the size and shape of your heart and how well your heart's chambers and valves are working. This procedure takes approximately one hour. There are no restrictions for this procedure.   Dr. [name] recommends you have a HEAD CT.  Follow-Up: Your physician wants you to follow-up in: 6 months with Dr. [name]. You will receive a reminder letter in the mail two months in advance. If you don't receive a letter, please call our office to schedule the follow-up appointment.   Any Other Special Instructions Will Be Listed Below (If Applicable).     If you need a refill on your cardiac medications before your next appointment, please call your pharmacy.

## 2016-02-08 ENCOUNTER — Telehealth: Payer: Self-pay

## 2016-02-08 DIAGNOSIS — R42 Dizziness and giddiness: Secondary | ICD-10-CM

## 2016-02-08 NOTE — Telephone Encounter (Signed)
Informed patient of results and verbal understanding expressed.   Neurology referral placed. Patient was grateful for call.

## 2016-02-08 NOTE — Telephone Encounter (Signed)
-----   Message from Sueanne Margarita, MD sent at 02/07/2016  4:37 PM EDT ----- Head CT shows chronic left occipital infarct but no acute CVA.  ? Whether this could have occurred at time of TAVR.  Please refer to Neuro for evaluation (dizziness and infarct on CT)

## 2016-02-26 ENCOUNTER — Encounter: Payer: Self-pay | Admitting: *Deleted

## 2016-02-26 DIAGNOSIS — Z006 Encounter for examination for normal comparison and control in clinical research program: Secondary | ICD-10-CM

## 2016-02-26 NOTE — Progress Notes (Signed)
I saw patient for 20-month [facility] study visit. Patient is continuing to have dizziness but states it is a little better. Patient has upcoming appointment with neurologist in August. Blood pressure was elevated 149/6/4. Patient stated had not taken Lisinopril today. I gave patient her Lisinopril. I gave patient her new study drug medications for this visit and took back her used medicine. I stressed importance of compliance with study medications. Patient verbalized understanding.

## 2016-02-29 ENCOUNTER — Telehealth: Payer: Self-pay | Admitting: *Deleted

## 2016-02-29 NOTE — Research (Signed)
I spoke with patient to let her know that her lab work looked good. Dr. [name] reviewed labs. I confirnmed with Dr. [name] that he wants patient to continue to take Aspirin 81 mg qd. I instructed patient to continue Aspirin everyday and Edoxaban 30 mg qd. Patient verbalized understanding.

## 2016-02-29 NOTE — Telephone Encounter (Signed)
See previous note

## 2016-03-12 ENCOUNTER — Ambulatory Visit (INDEPENDENT_AMBULATORY_CARE_PROVIDER_SITE_OTHER): Payer: Medicare Other | Admitting: Neurology

## 2016-03-12 ENCOUNTER — Encounter: Payer: Self-pay | Admitting: Neurology

## 2016-03-12 VITALS — BP 132/78 | HR 52 | Resp 20 | Ht 60.0 in | Wt 127.0 lb

## 2016-03-12 DIAGNOSIS — I63439 Cerebral infarction due to embolism of unspecified posterior cerebral artery: Secondary | ICD-10-CM | POA: Diagnosis not present

## 2016-03-12 DIAGNOSIS — R270 Ataxia, unspecified: Secondary | ICD-10-CM | POA: Insufficient documentation

## 2016-03-12 DIAGNOSIS — R42 Dizziness and giddiness: Secondary | ICD-10-CM

## 2016-03-12 DIAGNOSIS — H53459 Other localized visual field defect, unspecified eye: Secondary | ICD-10-CM | POA: Insufficient documentation

## 2016-03-12 DIAGNOSIS — H53451 Other localized visual field defect, right eye: Secondary | ICD-10-CM | POA: Diagnosis not present

## 2016-03-12 NOTE — Patient Instructions (Addendum)
I have arranged for you to have a physical therapy for gait stabilization at [facility] neuro rehabilitation. I have asked her ophthalmologist, Dr. Bing Plume, to perform a proper visual field test. This will determine driving restrictions and safety.  You  will undergo an MRI of the brain without contrast to further look at the extent of the stroke which can be over or under estimated in his CAT scan. He do not need to add any medication at this time. Allowed to walk with a cane.  Analese Sovine, MD

## 2016-03-12 NOTE — Progress Notes (Signed)
NEUROLOGY CLINIC   Provider:  Larey Seat, M D  Referring Provider: Jonathon Jordan, MD Primary Care Physician:  [name], MD  Chief Complaint  Patient presents with  . New Patient (Initial Visit)    dizziness comes and goes, discuss CT results    HPI:  Alexa Mcdonald is a 82 y.o. female , seen here as a referral from Fredna Dow, MD cardiology, [facility] .  Alexa Mcdonald reports that she has always slept well and that today's visit is due to her concern about her dizziness and balance problems. She is a mother of 3 adult sons and has not had serious medical problems until very recently. She suffers neither from headaches no significant forgetfulness but she developed a severe aortic stenosis this was found after she developed progressive shortness of breath New York Heart Association functional class III and had both critical aortic stenosis and severe mitral regurgitation. A cardiac catheterization showed also mild coronary artery disease. She has hearing loss age related and compensated for by hearing aids. She denies any chest pain denies having ever had chest pain.   She has been on chronic anticoagulation for atrial fibrillation, she  is considered to be in chronic diastoliccongestive heart failure, and had chronic lower extremity edema.  When she saw cardiologist Dr. [name] in May 2017 she reported dizziness even when her blood pressure was okay. He decreased her lisinopril to 20 mg daily from 40 mg  , the shortness of breath has dramatically improved since she underwent TAVR.  The lightheadedness however persisted  After TAVR and reduction in blood pressure medications and after leg edema , tinnitus and  shortness of breath had significantly improved.  Further evaluation : On July 6 the patient underwent a CT of the head to be evaluated for the dizziness causes. This showed an chronic left occipital infarct this means that the infarct has to be more than 3  months old to be considered chronic.  She denies any chest pain denies having ever had chest pain.  Social history:  The patient lives alone, widowed for 35 years. Husband died at age 23.   Review of Systems: Out of a complete 14 system review, the patient complains of only the following symptoms, and all other reviewed systems are negative.  Dizziness if he endorsed chief complaint, the patient has a history of diabetes, atrial fibrillation, congestive heart failure with atrial fibrillation and chronic anticoagulation aortic stenosis and mitral valve regurgitation.   Social History   Social History  . Marital status: Widowed    Spouse name: N/A  . Number of children: 3  . Years of education: N/A   Occupational History  . retire    Social History Main Topics  . Smoking status: Never Smoker  . Smokeless tobacco: Never Used  . Alcohol use No  . Drug use: No  . Sexual activity: Not on file   Other Topics Concern  . Not on file   Social History Narrative   Lives in [place].  Widowed.    Family History  Problem Relation Age of Onset  . Diabetes Mother   . Cancer - Prostate Father   . Pulmonary embolism Father   . Hypertension Sister   . Diabetes Sister   . Hypertension Brother   . Arrhythmia Brother     afib  . CAD Brother   . Pulmonary fibrosis Brother     Past Medical History:  Diagnosis Date  . 1St degree AV  block   . Bradycardia   . Chronic diastolic heart failure ([place]) 02/07/2016  . CKD (chronic kidney disease), stage III   . Colon, diverticulosis   . Diabetes mellitus    diet controlled  . Elevated brain natriuretic peptide (BNP) level   . Esophageal reflux   . Essential hypertension   . GERD (gastroesophageal reflux disease)   . Hematochezia   . HOH (hard of hearing)    wears bilateral hearing aids  . Hx of orthostatic hypotension   . Interstitial lung disease ([place])   . LBBB (left bundle branch block)   . Long term current use of anticoagulant  therapy   . Mediastinal goiter     recurrent s/p resection 1988  . Mitral regurgitation   . Osteopenia   . Osteoporosis   . Paroxysmal atrial fibrillation (HCC)    a. CHA2DS2VASc = 5-->coumadin;  b. previously on amio->d/c'd 11/2014 secondary to concern for possible amio lung.  . Pneumonia   . 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 Severe aortic stenosis   . Shortness of breath dyspnea    for last 3 weeks - with any activity  . Tachycardia-bradycardia ([place])   . Vitamin D deficiency     Past Surgical History:  Procedure Laterality Date  . CARDIAC CATHETERIZATION N/A 10/31/2015   Procedure: Right/Left Heart Cath and Coronary Angiography;  Surgeon: [name], MD;  Location: [place] [facility];  Service: Cardiovascular;  Laterality: N/A;  . CARPAL TUNNEL RELEASE Bilateral   . CATARACT EXTRACTION Bilateral   . CESAREAN SECTION     x3  . mediastinal removal of a goiter    . MRI  08/18/07   head (diabetes heart study at [facility])  . TEE WITHOUT CARDIOVERSION N/A 11/27/2015   Procedure: TRANSESOPHAGEAL ECHOCARDIOGRAM (TEE);  Surgeon: [name], MD;  Location: [place];  Service: Open Heart Surgery;  Laterality: N/A;  . TRANSCATHETER AORTIC VALVE REPLACEMENT, TRANSFEMORAL N/A 11/27/2015   Procedure: TRANSCATHETER AORTIC VALVE REPLACEMENT, TRANSFEMORAL;  Surgeon: [name], MD;  Location: [place];  Service: Open Heart Surgery;  Laterality: N/A;    Current Outpatient Prescriptions  Medication Sig Dispense Refill  . aspirin 81 MG chewable tablet Chew 1 tablet (81 mg total) by mouth daily.    . Calcium Carb-Cholecalciferol (CALCIUM 600/VITAMIN D3 PO) Take 1 tablet by mouth 2 (two) times daily.    [name] edoxaban (SAVAYSA) 30 MG TABS tablet Take 30 mg by mouth daily.    . furosemide (LASIX) 20 MG tablet Take 20mg  daily as needed for edema or weigh gain (3 lbs in 24 hours  or 5 lbs in 1 week) 30 tablet   . lisinopril (PRINIVIL,ZESTRIL) 20 MG tablet Take 1 tablet (20 mg total) by mouth daily. 30 tablet 11  . methimazole (TAPAZOLE) 5 MG tablet Take 7.5 mg by mouth daily. Take 1-1/2 tabs to = 7.5 mg    . Multiple Vitamins-Minerals (OCUVITE PRESERVISION PO) Take 1 capsule by mouth daily.     . pravastatin (PRAVACHOL) 10 MG tablet Take 10 mg by mouth daily.    [name] senna (SENOKOT) 8.6 MG tablet Take 2 tablets by mouth 2 (two) times daily.     No current facility-administered medications for this visit.     Allergies as of 03/12/2016 - Review Complete 03/12/2016  Allergen Reaction Noted  . Amoxicillin Other (See Comments) 11/25/2011  . Celebrex [celecoxib] Other (  See Comments) 11/25/2011    Vitals: BP 132/78   Pulse (!) 52   Resp 20   Ht 5' (1.524 m)   Wt 127 lb (57.6 kg)   BMI 24.80 kg/m  Last Weight:  Wt Readings from Last 1 Encounters:  03/12/16 127 lb (57.6 kg)   [id number] mass index is 24.8 kg/m.     Last Height:   Ht Readings from Last 1 Encounters:  03/12/16 5' (1.524 m)   FINDINGS: Brain Parenchyma: There is mild periventricular hypoattenuation and a small amount of bilateral basal ganglia mineralization. There is a somewhat more focal region of hypoattenuation adjacent to the occipital horn of the left lateral ventricle, likely a chronic infarct, with slightly asymmetric enlargement of the occipital horn of the left lateral ventricle, possibly due to underlying left occipital encephalomalacia. No evidence of acute cortical infarct. No intraparenchymal hemorrhage. No mass lesion or midline shift.  Ventricles, Sulci and Extra-axial Spaces: There is mild prominence of the ventricles and sulci compatible with age-related volume loss. No lobar predominance. No extra-axial collection.  Paranasal Sinuses and Mastoids: No fluid levels or advanced mucosal thickening.  Orbits: Normal.  Bones and Soft Tissues: Normal.  IMPRESSION: 1. No  acute intracranial abnormality. 2. Likely chronic left occipital infarct and mild for age volume loss without lobar predominance.   Electronically Signed   By: Ulyses Jarred M.D.  Physical exam:  General: The patient is awake, alert and appears not in acute distress. The patient is well groomed. Head: Normocephalic, atraumatic. Neck is supple. Mallampati 3   Cardiovascular:   without  murmurs or carotid bruit, and without distended neck veins. 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  Respiratory: Lungs are clear to auscultation. Skin:  Without evidence of edema, or rash Trunk: BMI is normal . The patient's posture is erect, she walks with a cane.   Neurologic exam : The patient is awake and alert, oriented to place and time.   Memory subjective described as intact.   Attention span & concentration ability appears normal.  Speech is fluent,  without dysarthria, dysphonia or aphasia.  Mood and affect are appropriate.  Cranial nerves: Pupils are equal and briskly reactive to light. Funduscopic exam without  evidence of pallor or edema.  Extraocular movements  in vertical and horizontal planes intact and without nystagmus.   Visual fields by finger perimetry - peripheral visual field restriction on the right Hearing to finger rub intact.   Facial sensation intact to fine touch.  Facial motor strength is symmetric and tongue and uvula move midline. Shoulder shrug was symmetrical.   Motor exam: Normal tone, muscle bulk and symmetric strength in all extremities.  Sensory:  Fine touch, pinprick and vibration were tested in all extremities. Proprioception tested in the upper extremities was normal.  Coordination: Rapid alternating movements / Finger-to-nose maneuver without evidence of ataxia, dysmetria or tremor.  Gait and station: Patient walks without assistive device and is able unassisted to climb up to the exam table. Strength within normal limits.  Stance is  stable and normal.  Tandem gait is fragmented. Turns with 4-5 Steps. Romberg testing is positive, swaying backwards.  Deep tendon reflexes: in the  upper and lower extremities are symmetric and intact. Babinski maneuver response is  downgoing.  The patient was advised of the nature of the diagnosed sleep disorder , the treatment options and risks for general a health and wellness arising from not treating the condition.  I spent more  than 40 minutes of face to face time with the patient. Greater than 50% of time was spent in counseling and coordination of care. We have discussed the diagnosis and differential and I answered the patient's questions.     Assessment:  After physical and neurologic examination, review of laboratory studies,  Personal review of imaging studies, reports of other /same  Imaging studies ,  Results of polysomnography/ neurophysiology testing and pre-existing records as far as provided in visit., my assessment is   1) Alexa Mcdonald has a largely unremarkable neurologic examination. If I would not have known about the CT results I would not have checked her eye movements and visual fields as diligently as I did and she does clearly have some peripheral right visual field restriction. Today a stroke by CT is more difficult than by MRI, but it is remarkable that the patient recalls becoming dizzy by spring of this year and the Ct appearance of this  stroke dates it to 3 months or older, There is a high chance that she suffered the stroke but undergoing a procedure but it could have been the week prior or after.  2) she does not have any pacemaker or mechanical device implanted and I think it advisable to obtain an MRI of the brain also to further look at the depending cranial  blood vessel structures.  3) EF 45% , continue SAVAYASA. Patient was in the past on coumadin. No additional antiplatelet therapy .     Plan:  Treatment plan and additional workup : MRI brain, can be  obtained through [facility], reading by [facility]. A sleep study is not needed.  Neurorehab for gait stabilization.    Alexa Partridge Anastazia Creek MD  03/12/2016   CC: Jonathon Jordan, Md [place], [place] [address]

## 2016-03-20 ENCOUNTER — Encounter: Payer: Self-pay | Admitting: Cardiovascular Disease

## 2016-03-20 ENCOUNTER — Encounter: Payer: Self-pay | Admitting: Cardiology

## 2016-03-28 ENCOUNTER — Telehealth: Payer: Self-pay | Admitting: Rehabilitation

## 2016-03-28 ENCOUNTER — Ambulatory Visit: Payer: Medicare Other | Attending: Neurology | Admitting: Rehabilitation

## 2016-03-28 DIAGNOSIS — M6281 Muscle weakness (generalized): Secondary | ICD-10-CM | POA: Insufficient documentation

## 2016-03-28 DIAGNOSIS — R2689 Other abnormalities of gait and mobility: Secondary | ICD-10-CM | POA: Diagnosis not present

## 2016-03-28 DIAGNOSIS — R2681 Unsteadiness on feet: Secondary | ICD-10-CM | POA: Insufficient documentation

## 2016-03-28 DIAGNOSIS — R293 Abnormal posture: Secondary | ICD-10-CM | POA: Diagnosis not present

## 2016-03-28 NOTE — Therapy (Addendum)
[facility] [address] [place], [place], [address] Phone: [phone]   Fax:  [phone]  Physical Therapy Evaluation  Patient Details  Name: Alexa Mcdonald MRN: [id number] Date of Birth: Jan 15, 1921 Referring Provider: Larey Seat, MD  Encounter Date: 03/28/2016      PT End of Session - 03/28/16 1306    Visit Number 1   Number of Visits 7   Date for PT Re-Evaluation 05/12/16   Authorization Type MCR-G code every 10th visit   PT Start Time 1102   PT Stop Time 1145   PT Time Calculation (min) 43 min   Activity Tolerance Patient tolerated treatment well      Past Medical History:  Diagnosis Date  . 1St degree AV block   . Bradycardia   . Chronic diastolic heart failure ([place]) 02/07/2016  . CKD (chronic kidney disease), stage III   . Colon, diverticulosis   . Diabetes mellitus    diet controlled  . Elevated brain natriuretic peptide (BNP) level   . Esophageal reflux   . Essential hypertension   . GERD (gastroesophageal reflux disease)   . Hematochezia   . HOH (hard of hearing)    wears bilateral hearing aids  . Hx of orthostatic hypotension   . Interstitial lung disease ([place])   . LBBB (left bundle branch block)   . Long term current use of anticoagulant therapy   . Mediastinal goiter     recurrent s/p resection 1988  . Mitral regurgitation   . Osteopenia   . Osteoporosis   . Paroxysmal atrial fibrillation (HCC)    a. CHA2DS2VASc = 5-->coumadin;  b. previously on amio->d/c'd 11/2014 secondary to concern for possible amio lung.  . Pneumonia   . 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 Severe aortic stenosis   . Shortness of breath dyspnea    for last 3 weeks - with any activity  . Tachycardia-bradycardia ([place])   . Vitamin D deficiency     Past Surgical History:   Procedure Laterality Date  . CARDIAC CATHETERIZATION N/A 10/31/2015   Procedure: Right/Left Heart Cath and Coronary Angiography;  Surgeon: [name], MD;  Location: [facility];  Service: Cardiovascular;  Laterality: N/A;  . CARPAL TUNNEL RELEASE Bilateral   . CATARACT EXTRACTION Bilateral   . CESAREAN SECTION     x3  . mediastinal removal of a goiter    . MRI  08/18/07   head (diabetes heart study at [facility])  . TEE WITHOUT CARDIOVERSION N/A 11/27/2015   Procedure: TRANSESOPHAGEAL ECHOCARDIOGRAM (TEE);  Surgeon: [name], MD;  Location: [facility];  Service: Open Heart Surgery;  Laterality: N/A;  . TRANSCATHETER AORTIC VALVE REPLACEMENT, TRANSFEMORAL N/A 11/27/2015   Procedure: TRANSCATHETER AORTIC VALVE REPLACEMENT, TRANSFEMORAL;  Surgeon: [name], MD;  Location: [facility];  Service: Open Heart Surgery;  Laterality: N/A;    There were no vitals filed for this visit.       Subjective Assessment - 03/28/16 1107    Subjective "They told me to come."  "I probably have problems walking and I'm unsteady sometimes."     Patient is accompained by: Family member  granddaughter Katie   Limitations Walking;House hold activities   Patient Stated Goals "to figure out why I'm light headed."    Currently in Pain? No/denies            [facility]  PT Assessment - 03/28/16 1111      Assessment   Medical Diagnosis CVA, balance   Referring Provider Larey Seat, MD   Onset Date/Surgical Date --  since the heart surgery 11/2015     Precautions   Precautions Fall     Balance Screen   Has the patient fallen in the past 6 months No   Has the patient had a decrease in activity level because of a fear of falling?  No   Is the patient reluctant to leave their home because of a fear of falling?  Yes     [place] Private residence   Living Arrangements Alone   Available Help at Discharge Family;Available PRN/intermittently;Neighbor   Type of [place] to enter   [employer] of Steps 8  in the back, 4 front   Entrance Stairs-Rails Right;Left;Cannot reach both  can reach both in the back   [place] One level   [place] bars - tub/shower;Walker - 2 wheels  walk in shower, doesn't use RW     Prior Function   Level of Independence Independent  has cleaning lady and family grocery shops   Vocation Retired   Leisure likes to get [employer]   Overall Cognitive Status Within Functional Limits for tasks assessed     Observation/Other Assessments   Focus on Therapeutic Outcomes (FOTO)  Neuro QOL LE-40%     Sensation   Light Touch Appears Intact   Hot/Cold Appears Intact   Proprioception Appears Intact     Coordination   Gross Motor Movements are Fluid and Coordinated Yes   Fine Motor Movements are Fluid and Coordinated Yes     ROM / Strength   AROM / PROM / Strength Strength     Strength   Overall Strength Within functional limits for tasks performed  grossly WFL, some hip flex weakness (3+/5)     Transfers   Transfers Sit to Stand;Stand to Sit   Sit to Stand 6: Modified independent (Device/Increase time)   Five time sit to stand comments  15.25 with hands on lap   Stand to Sit 6: Modified independent (Device/Increase time)     Ambulation/Gait   Ambulation/Gait Yes   Ambulation/Gait Assistance 5: Supervision;6: Modified independent (Device/Increase time)   Ambulation/Gait Assistance Details Pt mostly mod I for gait, however S during balance challenges and when fatigued due to noted hip instability.     Ambulation Distance (Feet) 345 Feet   Assistive device None   Gait Pattern Step-through pattern;Trendelenburg;Lateral hip instability;Trunk flexed   Ambulation Surface Level;Indoor   Gait velocity 2.73 ft/sec   Stairs Yes   Stairs Assistance 6: Modified independent (Device/Increase time)   Stair Management Technique One rail Right;Alternating pattern;Forwards    Number of Stairs 4   Height of Stairs 6     Standardized Balance Assessment   Standardized Balance Assessment Dynamic Gait Index     Dynamic Gait Index   Level Surface Normal   Change in Gait Speed Mild Impairment   Gait with Horizontal Head Turns Mild Impairment   Gait with Vertical Head Turns Mild Impairment   Gait and Pivot Turn Mild Impairment   Step Over Obstacle Mild Impairment   Step Around Obstacles Mild Impairment   Steps Mild Impairment   Total Score 17   DGI comment: Scores of 19 or less are predictive of falls in older community living adults  PT Education - 03/28/16 1305    Education provided Yes   Education Details Education on evaluation findings, orthostatic BP (encouraged to intake more fluids as she states she barely drinks water throughout the day)-will let MD know), POC, goals.     Person(s) Educated Patient;Other (comment)  granddaughter   Methods Explanation   Comprehension Verbalized understanding          PT Short Term Goals - 03/28/16 1410      PT SHORT TERM GOAL #1   Title Pt will initiate HEP in order to indicate improved functional mobility.  (Target Date: 04/18/16)   Time 3   Period Weeks   Status New     PT SHORT TERM GOAL #2   Title Pt will improve DGI to 20/24 in order to indicate decreased fall risk.     Time 3   Period Weeks   Status New     PT SHORT TERM GOAL #3   Title Will assess 6 MWT and improve distance by 150' in order to indicate improved functional endurance.     Time 3   Period Weeks   Status New           PT Long Term Goals - 03/28/16 1412      PT LONG TERM GOAL #1   Title Pt will be independent with HEP in order to indicate improved functional mobility and decreased fall risk.  (Target Date: 05/09/16)   Time 6   Period Weeks   Status New     PT LONG TERM GOAL #2   Title Pt will verbalize compliance with walking program (either in home or community) in order to  indicate improved functional endurance.     Time 6   Period Weeks   Status New     PT LONG TERM GOAL #3   Title Pt will ambulate 500' over unlevel paved outdoor surfaces at mod I level in order to indicate safe community negotiation.     Time 6   Period Weeks   Status New     PT LONG TERM GOAL #4   Title Pt will improve 6 MWT distance by 150' from STG distance in order to indicate improved functional endurance.     Time 6   Period Weeks   Status New               Plan - 03/28/16 1310    Clinical Impression Statement Pt presents with L occipital CVA that likely happened prior to or during atrial valve replacement in April of this year.  Note history of DM, CHF, and afib that could all impact her progress in PT.  She also has complaints of dizziness or light headedness with most upright activity.  Checked BP when sitting vs upright and note that pt is orthostatic (sitting 175/61 vs standing x 2 mins was 126/56).  PT to notify MD regarding matter.  Education regarding possible hydration issues.  Upon PT evaluation, in addition to orthostatic issues (does not have overt light headedness, but does state "I can tell its there"), pt with decreased hip strength with antalgic gait pattern and decreased balance with DGI score of 17/24, indicative of increased fall risk.  Pt is of evolving presentation due to BP issues and moderate complexity via POC due to medical issues.  Pt will benefti from skilled OP neuro PT in order to address deficits.     Rehab Potential Good   PT Frequency 1x / week  PT Duration 6 weeks   PT Treatment/Interventions ADLs/Self Care Home Management;Gait training;Stair training;Functional mobility training;Therapeutic activities;Therapeutic exercise;Balance training;Neuromuscular re-education;Patient/family education;Vestibular   PT Next Visit Plan est HEP for balance, hip strengthening (clam or SL hip abd or even standing would be great), continue to educate on increased  activity and hydration.     Consulted and Agree with Plan of Care Patient;Family member/caregiver   Family Member Consulted granddaughter Joellen [place].       Patient will benefit from skilled therapeutic intervention in order to improve the following deficits and impairments:  Cardiopulmonary status limiting activity, Decreased activity tolerance, Decreased balance, Decreased mobility, Decreased strength, Difficulty walking, Impaired flexibility, Improper body mechanics, Postural dysfunction  Visit Diagnosis: Abnormal posture - Plan: PT plan of care cert/re-cert  Other abnormalities of gait and mobility - Plan: PT plan of care cert/re-cert  Unsteadiness on feet - Plan: PT plan of care cert/re-cert  Muscle weakness (generalized) - Plan: PT plan of care cert/re-cert      G-Codes - [id number]    Functional Assessment Tool Used DGI: 17/24   Functional Limitation Mobility: Walking and moving around   Mobility: Walking and Moving Around Current Status [phone]) At least 20 percent but less than 40 percent impaired, limited or restricted   Mobility: Walking and Moving Around Goal Status [phone]) At least 1 percent but less than 20 percent impaired, limited or restricted     Gcode D/C status [id number]): CJ  Problem List Patient Active Problem List   Diagnosis Date Noted  . Loss of peripheral visual field 03/12/2016  . Ataxia 03/12/2016  . Dizziness and giddiness 03/12/2016  . Cerebrovascular accident (CVA) due to embolism of posterior cerebral artery with infarctions of both occipital lobes ([place]) 03/12/2016  . Chronic diastolic heart failure ([place]) 02/07/2016  . 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 Severe aortic stenosis   . Atrial fibrillation, persistent ([place])   . Mitral regurgitation   . Interstitial lung disease ([place]) 10/30/2014  . Hematochezia 12/13/2013  . CKD (chronic kidney disease), stage III 12/13/2013  . Hyperthyroidism 12/13/2013  . GERD  (gastroesophageal reflux disease) 12/13/2013  . Long term current use of anticoagulant therapy 12/13/2013  . Elevated brain natriuretic peptide (BNP) level 12/13/2013  . Sinus bradycardia 12/13/2013  . Pneumonia 12/13/2013  . Encounter for therapeutic drug monitoring 08/29/2013  . Essential hypertension 08/05/2013  . Edema extremities 08/05/2013  . Bradycardia 11/27/2011    Cameron Sprang, PT, MPT [facility] [address] [place], [place], [address] Phone: [phone]   Fax:  [phone] 03/28/16, 2:18 PM  Name: ANAYANCY POLAND MRN: [id number] Date of Birth: [birth year]

## 2016-03-28 NOTE — Telephone Encounter (Signed)
Dr. Brett Fairy,   I just evaluated Ms. Alexa Mcdonald today at [facility] neuro for PT.  Noted that she has c/o dizziness (light headedness) during upright activity.  Upon checking orthostatics (did not test supine) her BP went from 175/61 in sitting to 126/56 in standing (for 2 mins).  She states light headedness is better than prior to atrial valve placement, however is still present.  Feel that it may be more medically related than CVA.  It did level out with further activity (at end of session was 178/67).  I just wanted you to know for further medical management and any advice you may have on parameters for treatment.    Thanks,  Cameron Sprang, PT, MPT [facility] [address] [place], [place], [address] Phone: [phone]   Fax:  [phone] 03/28/16, 2:21 PM

## 2016-03-31 NOTE — Telephone Encounter (Signed)
I spoke to Alexa Mcdonald. I advised her that Dr. Brett Fairy wants her to follow up with Dr. Stephanie Acre regarding her orthostatic vital signs during physical therapy last week. Alexa Mcdonald is agreeable to this. Alexa Mcdonald says that she will call Dr. Myer Peer office next week and discuss. Alexa Mcdonald is going out of town on Thursday. I will send a copy of this message to Dr. Myer Peer office. Alexa Mcdonald verbalized understanding.

## 2016-03-31 NOTE — Telephone Encounter (Signed)
Patient was orthostatic at PT,  Need to share with PCP for further evaluation.

## 2016-04-10 ENCOUNTER — Encounter: Payer: Self-pay | Admitting: Family Medicine

## 2016-04-10 ENCOUNTER — Encounter: Payer: Self-pay | Admitting: Cardiology

## 2016-04-10 DIAGNOSIS — Z0000 Encounter for general adult medical examination without abnormal findings: Secondary | ICD-10-CM | POA: Diagnosis not present

## 2016-04-10 DIAGNOSIS — E1122 Type 2 diabetes mellitus with diabetic chronic kidney disease: Secondary | ICD-10-CM | POA: Diagnosis not present

## 2016-04-10 DIAGNOSIS — Z7901 Long term (current) use of anticoagulants: Secondary | ICD-10-CM | POA: Diagnosis not present

## 2016-04-10 DIAGNOSIS — N183 Chronic kidney disease, stage 3 (moderate): Secondary | ICD-10-CM | POA: Diagnosis not present

## 2016-04-10 DIAGNOSIS — E7800 Pure hypercholesterolemia, unspecified: Secondary | ICD-10-CM | POA: Diagnosis not present

## 2016-04-10 DIAGNOSIS — E559 Vitamin D deficiency, unspecified: Secondary | ICD-10-CM | POA: Diagnosis not present

## 2016-04-10 DIAGNOSIS — M810 Age-related osteoporosis without current pathological fracture: Secondary | ICD-10-CM | POA: Diagnosis not present

## 2016-04-10 DIAGNOSIS — I481 Persistent atrial fibrillation: Secondary | ICD-10-CM | POA: Diagnosis not present

## 2016-04-10 DIAGNOSIS — E0520 Thyrotoxicosis with toxic multinodular goiter without thyrotoxic crisis or storm: Secondary | ICD-10-CM | POA: Diagnosis not present

## 2016-04-10 DIAGNOSIS — Z23 Encounter for immunization: Secondary | ICD-10-CM | POA: Diagnosis not present

## 2016-04-10 DIAGNOSIS — I350 Nonrheumatic aortic (valve) stenosis: Secondary | ICD-10-CM | POA: Diagnosis not present

## 2016-04-10 DIAGNOSIS — I10 Essential (primary) hypertension: Secondary | ICD-10-CM | POA: Diagnosis not present

## 2016-04-12 DIAGNOSIS — R42 Dizziness and giddiness: Secondary | ICD-10-CM | POA: Diagnosis not present

## 2016-04-12 DIAGNOSIS — I69398 Other sequelae of cerebral infarction: Secondary | ICD-10-CM | POA: Diagnosis not present

## 2016-04-14 ENCOUNTER — Other Ambulatory Visit: Payer: Self-pay | Admitting: Adult Health

## 2016-04-14 DIAGNOSIS — R42 Dizziness and giddiness: Secondary | ICD-10-CM | POA: Diagnosis not present

## 2016-04-14 DIAGNOSIS — I69398 Other sequelae of cerebral infarction: Secondary | ICD-10-CM | POA: Diagnosis not present

## 2016-04-15 ENCOUNTER — Other Ambulatory Visit: Payer: Self-pay | Admitting: Adult Health

## 2016-04-15 DIAGNOSIS — R42 Dizziness and giddiness: Secondary | ICD-10-CM | POA: Diagnosis not present

## 2016-04-15 DIAGNOSIS — I69398 Other sequelae of cerebral infarction: Secondary | ICD-10-CM | POA: Diagnosis not present

## 2016-04-16 ENCOUNTER — Other Ambulatory Visit: Payer: Self-pay | Admitting: Adult Health

## 2016-04-16 ENCOUNTER — Other Ambulatory Visit: Payer: Self-pay | Admitting: Cardiology

## 2016-04-16 DIAGNOSIS — I69398 Other sequelae of cerebral infarction: Secondary | ICD-10-CM | POA: Diagnosis not present

## 2016-04-16 DIAGNOSIS — R42 Dizziness and giddiness: Secondary | ICD-10-CM | POA: Diagnosis not present

## 2016-04-17 DIAGNOSIS — R42 Dizziness and giddiness: Secondary | ICD-10-CM | POA: Diagnosis not present

## 2016-04-17 DIAGNOSIS — I69398 Other sequelae of cerebral infarction: Secondary | ICD-10-CM | POA: Diagnosis not present

## 2016-04-18 ENCOUNTER — Ambulatory Visit: Payer: Medicare Other

## 2016-04-21 DIAGNOSIS — I69398 Other sequelae of cerebral infarction: Secondary | ICD-10-CM | POA: Diagnosis not present

## 2016-04-21 DIAGNOSIS — R42 Dizziness and giddiness: Secondary | ICD-10-CM | POA: Diagnosis not present

## 2016-04-22 DIAGNOSIS — I69398 Other sequelae of cerebral infarction: Secondary | ICD-10-CM | POA: Diagnosis not present

## 2016-04-22 DIAGNOSIS — R42 Dizziness and giddiness: Secondary | ICD-10-CM | POA: Diagnosis not present

## 2016-04-23 DIAGNOSIS — I69398 Other sequelae of cerebral infarction: Secondary | ICD-10-CM | POA: Diagnosis not present

## 2016-04-23 DIAGNOSIS — R42 Dizziness and giddiness: Secondary | ICD-10-CM | POA: Diagnosis not present

## 2016-04-25 ENCOUNTER — Ambulatory Visit: Payer: Medicare Other | Admitting: Physical Therapy

## 2016-04-29 DIAGNOSIS — I69398 Other sequelae of cerebral infarction: Secondary | ICD-10-CM | POA: Diagnosis not present

## 2016-04-29 DIAGNOSIS — R42 Dizziness and giddiness: Secondary | ICD-10-CM | POA: Diagnosis not present

## 2016-05-01 ENCOUNTER — Ambulatory Visit: Payer: Medicare Other | Admitting: Neurology

## 2016-05-01 ENCOUNTER — Telehealth: Payer: Self-pay

## 2016-05-01 DIAGNOSIS — I69398 Other sequelae of cerebral infarction: Secondary | ICD-10-CM | POA: Diagnosis not present

## 2016-05-01 DIAGNOSIS — R42 Dizziness and giddiness: Secondary | ICD-10-CM | POA: Diagnosis not present

## 2016-05-01 NOTE — Telephone Encounter (Signed)
Pt has an appt today with Dr. Brett Fairy to discuss MRI results but the MRI has not been performed. I called pt. She says that no one has called her to schedule her MRI. She is agreeable to cancelling today's appt and when I call the pt with her MRI results, we will schedule a follow up then.   I will check into status of pt's MRI.

## 2016-05-02 ENCOUNTER — Ambulatory Visit: Payer: Medicare Other | Admitting: Rehabilitation

## 2016-05-02 DIAGNOSIS — I69398 Other sequelae of cerebral infarction: Secondary | ICD-10-CM | POA: Diagnosis not present

## 2016-05-02 DIAGNOSIS — R42 Dizziness and giddiness: Secondary | ICD-10-CM | POA: Diagnosis not present

## 2016-05-05 NOTE — Telephone Encounter (Signed)
It appears [facility] has called the pt several times to make an appt but pt did not call them back (per Andee Poles.) I called pt and asked her to call [facility] at [phone] and tell them that she is ready to schedule her MRI. Pt verbalized understanding.

## 2016-05-06 DIAGNOSIS — R42 Dizziness and giddiness: Secondary | ICD-10-CM | POA: Diagnosis not present

## 2016-05-06 DIAGNOSIS — I69398 Other sequelae of cerebral infarction: Secondary | ICD-10-CM | POA: Diagnosis not present

## 2016-05-07 DIAGNOSIS — R42 Dizziness and giddiness: Secondary | ICD-10-CM | POA: Diagnosis not present

## 2016-05-07 DIAGNOSIS — I69398 Other sequelae of cerebral infarction: Secondary | ICD-10-CM | POA: Diagnosis not present

## 2016-05-08 DIAGNOSIS — I69398 Other sequelae of cerebral infarction: Secondary | ICD-10-CM | POA: Diagnosis not present

## 2016-05-08 DIAGNOSIS — R42 Dizziness and giddiness: Secondary | ICD-10-CM | POA: Diagnosis not present

## 2016-05-09 ENCOUNTER — Ambulatory Visit: Payer: Medicare Other | Admitting: Rehabilitation

## 2016-05-16 ENCOUNTER — Ambulatory Visit: Payer: Medicare Other | Admitting: Rehabilitation

## 2016-05-17 ENCOUNTER — Ambulatory Visit
Admission: RE | Admit: 2016-05-17 | Discharge: 2016-05-17 | Disposition: A | Discharge status: Home / Self Care | Payer: Medicare Other | Source: Ambulatory Visit | Attending: Neurology | Admitting: Neurology

## 2016-05-17 DIAGNOSIS — R270 Ataxia, unspecified: Secondary | ICD-10-CM

## 2016-05-17 DIAGNOSIS — H53451 Other localized visual field defect, right eye: Secondary | ICD-10-CM

## 2016-05-17 DIAGNOSIS — I63439 Cerebral infarction due to embolism of unspecified posterior cerebral artery: Secondary | ICD-10-CM

## 2016-05-17 DIAGNOSIS — R42 Dizziness and giddiness: Secondary | ICD-10-CM | POA: Diagnosis not present

## 2016-05-23 ENCOUNTER — Ambulatory Visit: Payer: Medicare Other | Admitting: Rehabilitation

## 2016-05-26 ENCOUNTER — Telehealth: Payer: Self-pay

## 2016-05-26 NOTE — Telephone Encounter (Signed)
-----   Message from Larey Seat, MD sent at 05/23/2016 12:34 PM EDT ----- Old occipital stroke, no acute changes. Chronic vascular changs in balance center - cerebellum- explaining  Dizziness. Atrophy of mesio-temporal region suggestive of memory impairment - chronic findings. CC to PCP

## 2016-05-26 NOTE — Telephone Encounter (Signed)
I spoke to pt and advised her that per Dr. Brett Fairy, her MRI results showed an old stroke, but no acute changes. Pt said that she knew about the stroke. I also advised her that chronic vascular changes in her balance center of her brain could explain possible dizziness. I also advised pt that there was atrophy of the mesio-temporal region which is suggestive of memory impairment. I offered pt an appt with Dr. Brett Fairy to discuss results. Pt accepted, and an appt was made for 06/11/16 at 2:00. (Pt has transportation needs.) Pt verbalized understanding of results. Pt had no questions at this time but was encouraged to call back if questions arise.

## 2016-05-27 ENCOUNTER — Encounter: Payer: Self-pay | Admitting: *Deleted

## 2016-05-27 DIAGNOSIS — Z006 Encounter for examination for normal comparison and control in clinical research program: Secondary | ICD-10-CM

## 2016-05-27 NOTE — Progress Notes (Signed)
I saw patient for 27-month Envisage Study.today. Patient is doing well and reported no bleeding issues.Labs were drawn for study. Patient is 87% compliant with study drug. I dispensed new study drug for patient. I realized patient was not taking Lisinopril and Aspirin as ordered per Dr. Antionette Char notes. I wrote all medications down for patient and patient's son. I suggested a pill box to help with medication compliance. Patient and patient's son verbalized understanding.

## 2016-06-04 ENCOUNTER — Telehealth: Payer: Self-pay | Admitting: *Deleted

## 2016-06-04 NOTE — Telephone Encounter (Signed)
I called patient to let her know that her study lab work is okay and was reviewed by Dr. [name]. I will meet with patient on 06/18/16 to review a new consent and have her sign the latest Envisage study consent. Patient stated she is feeling better since taking Lisinopril at bedtime. Patient is having less dizziness.

## 2016-06-11 ENCOUNTER — Encounter: Payer: Self-pay | Admitting: Neurology

## 2016-06-11 ENCOUNTER — Ambulatory Visit (INDEPENDENT_AMBULATORY_CARE_PROVIDER_SITE_OTHER): Payer: Medicare Other | Admitting: Neurology

## 2016-06-11 VITALS — BP 140/64 | HR 64 | Wt 134.0 lb

## 2016-06-11 DIAGNOSIS — I480 Paroxysmal atrial fibrillation: Secondary | ICD-10-CM

## 2016-06-11 DIAGNOSIS — I69398 Other sequelae of cerebral infarction: Secondary | ICD-10-CM

## 2016-06-11 DIAGNOSIS — I639 Cerebral infarction, unspecified: Secondary | ICD-10-CM | POA: Insufficient documentation

## 2016-06-11 DIAGNOSIS — H53469 Homonymous bilateral field defects, unspecified side: Secondary | ICD-10-CM

## 2016-06-11 NOTE — Progress Notes (Signed)
NEUROLOGY CLINIC   Provider:  Larey Seat, M D  Referring Provider: Jonathon Jordan, MD Primary Care Physician:  [name], MD  Chief Complaint  Patient presents with  . Follow-up    results    HPI:  Alexa Mcdonald is a 82 y.o. female , seen here as a referral from Fredna Dow, MD cardiology, [facility] .  Alexa Mcdonald reports that she has always slept well and that today's visit is due to her concern about her dizziness and balance problems. She is a mother of 3 adult sons and has not had serious medical problems until very recently. She suffers neither from headaches no significant forgetfulness but she developed a severe aortic stenosis this was found after she developed progressive shortness of breath New York Heart Association functional class III and had both critical aortic stenosis and severe mitral regurgitation. A cardiac catheterization showed also mild coronary artery disease. She has hearing loss age related and compensated for by hearing aids. She denies any chest pain denies having ever had chest pain.   She has been on chronic anticoagulation for atrial fibrillation, she  is considered to be in chronic diastoliccongestive heart failure, and had chronic lower extremity edema.  When she saw cardiologist Dr. [name] in May 2017 she reported dizziness even when her blood pressure was okay. He decreased her lisinopril to 20 mg daily from 40 mg  , the shortness of breath has dramatically improved since she underwent TAVR.  The lightheadedness however persisted  After TAVR and reduction in blood pressure medications and after leg edema , tinnitus and  shortness of breath had significantly improved.  Further evaluation : On July 6 the patient underwent a CT of the head to be evaluated for the dizziness causes. This showed an chronic left occipital infarct this means that the infarct has to be more than 3 months old to be considered chronic.  She denies any  chest pain denies having ever had chest pain. Social history:  The patient lives alone, widowed for 35 years. Husband died at age 24.   Interval history from 06/11/2016, I have the pleasure of seeing Alexa Mcdonald today. She was finally evaluated in an MRI of the brain on 05/17/2016 which revealed enlarged third and lateral ventricles, mild to moderate cortical atrophy, and normal orbits. It showed also a left occipital stroke that was considered chronic older than 3 months and has some hemorrhagic component to it. This chronic stroke was seen in a July 2017 obtained brain CT scan. She has also some vertical ectasia of the vertebrobasilar system this is an age-related finding does not predispose her to strokes. Her main risk factor is atrial fibrillation. In addition she had suffered a light headedness spell in May 2017 after her blood pressure medication was adjusted.  She wondered if she has suffered a watershed infarct due to low blood pressure.   Review of Systems: Out of a complete 14 system review, the patient complains of only the following symptoms, and all other reviewed systems are negative.  She endorsed today the geriatric depression score 4 out of 15.  Dizziness if he endorsed chief complaint, the patient has a history of diabetes, atrial fibrillation, congestive heart failure with atrial fibrillation and chronic anticoagulation aortic stenosis and mitral valve regurgitation.     Social History   Social History  . Marital status: Widowed    Spouse name: N/A  . Number of children: 3  . Years of education: N/A  Occupational History  . retire    Social History Main Topics  . Smoking status: Never Smoker  . Smokeless tobacco: Never Used  . Alcohol use No  . Drug use: No  . Sexual activity: Not on file   Other Topics Concern  . Not on file   Social History Narrative   Lives in [place].  Widowed.    Family History  Problem Relation Age of Onset  . Diabetes  Mother   . Cancer - Prostate Father   . Pulmonary embolism Father   . Hypertension Sister   . Diabetes Sister   . Hypertension Brother   . Arrhythmia Brother     afib  . CAD Brother   . Pulmonary fibrosis Brother     Past Medical History:  Diagnosis Date  . 1st degree AV block   . Bradycardia   . Chronic diastolic heart failure ([place]) 02/07/2016  . CKD (chronic kidney disease), stage III   . Colon, diverticulosis   . Diabetes mellitus    diet controlled  . Elevated brain natriuretic peptide (BNP) level   . Esophageal reflux   . Essential hypertension   . GERD (gastroesophageal reflux disease)   . Hematochezia   . HOH (hard of hearing)    wears bilateral hearing aids  . Hx of orthostatic hypotension   . Interstitial lung disease ([place])   . LBBB (left bundle branch block)   . Long term current use of anticoagulant therapy   . Mediastinal goiter     recurrent s/p resection 1988  . Mitral regurgitation   . Osteopenia   . Osteoporosis   . Paroxysmal atrial fibrillation ([facility])    a. CHA2DS2VASc = 5-->coumadin;  b. previously on amio->d/c'd 11/2014 secondary to concern for possible amio lung.  . Pneumonia   . 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 Severe aortic stenosis   . Shortness of breath dyspnea    for last 3 weeks - with any activity  . Tachycardia-bradycardia ([place])   . Vitamin D deficiency     Past Surgical History:  Procedure Laterality Date  . CARDIAC CATHETERIZATION N/A 10/31/2015   Procedure: Right/Left Heart Cath and Coronary Angiography;  Surgeon: [name], MD;  Location: [facility];  Service: Cardiovascular;  Laterality: N/A;  . CARPAL TUNNEL RELEASE Bilateral   . CATARACT EXTRACTION Bilateral   . CESAREAN SECTION     x3  . mediastinal removal of a goiter    . MRI  08/18/07   head (diabetes heart [facility] at [facility])  . TEE  WITHOUT CARDIOVERSION N/A 11/27/2015   Procedure: TRANSESOPHAGEAL ECHOCARDIOGRAM (TEE);  Surgeon: [name], MD;  Location: [facility];  Service: Open Heart Surgery;  Laterality: N/A;  . TRANSCATHETER AORTIC VALVE REPLACEMENT, TRANSFEMORAL N/A 11/27/2015   Procedure: TRANSCATHETER AORTIC VALVE REPLACEMENT, TRANSFEMORAL;  Surgeon: [name], MD;  Location: [facility];  Service: Open Heart Surgery;  Laterality: N/A;    Current Outpatient Prescriptions  Medication Sig Dispense Refill  . aspirin 81 MG chewable tablet Chew 1 tablet (81 mg total) by mouth daily.    . Calcium Carb-Cholecalciferol (CALCIUM 600/VITAMIN D3 PO) Take 1 tablet by mouth 2 (two) times daily.    [name] edoxaban (SAVAYSA) 30 MG TABS tablet Take 30 mg by mouth daily.    . furosemide (LASIX) 20 MG tablet Take 20mg  daily as needed for edema or weigh gain (  3 lbs in 24 hours or 5 lbs in 1 week) 30 tablet   . lisinopril (PRINIVIL,ZESTRIL) 20 MG tablet Take 1 tablet (20 mg total) by mouth daily. 30 tablet 11  . methimazole (TAPAZOLE) 5 MG tablet Take 7.5 mg by mouth daily. Take 1-1/2 tabs to = 7.5 mg    . Multiple Vitamins-Minerals (OCUVITE PRESERVISION PO) Take 1 capsule by mouth daily.     . pravastatin (PRAVACHOL) 10 MG tablet Take 10 mg by mouth daily.     No current facility-administered medications for this visit.     Allergies as of 06/11/2016 - Review Complete 06/11/2016  Allergen Reaction Noted  . Amoxicillin Other (See Comments) 11/25/2011  . Celebrex [celecoxib] Other (See Comments) 11/25/2011    Vitals: BP 140/64   Pulse 64   Wt 134 lb (60.8 kg)   BMI 26.17 kg/m  Last Weight:  Wt Readings from Last 1 Encounters:  06/11/16 134 lb (60.8 kg)   IPJ:ASNK mass index is 26.17 kg/m.     Last Height:   Ht Readings from Last 1 Encounters:  05/27/16 5' (1.524 m)   FINDINGS: Brain Parenchyma: There is mild periventricular hypoattenuation and a small amount of bilateral basal ganglia mineralization. There is a somewhat  more focal region of hypoattenuation adjacent to the occipital horn of the left lateral ventricle, likely a chronic infarct, with slightly asymmetric enlargement of the occipital horn of the left lateral ventricle, possibly due to underlying left occipital encephalomalacia. No evidence of acute cortical infarct. No intraparenchymal hemorrhage. No mass lesion or midline shift.  Ventricles, Sulci and Extra-axial Spaces: There is mild prominence of the ventricles and sulci compatible with age-related volume loss. No lobar predominance. No extra-axial collection.  Paranasal Sinuses and Mastoids: No fluid levels or advanced mucosal thickening.  Orbits: Normal.  Bones and Soft Tissues: Normal.  IMPRESSION: 1. No acute intracranial abnormality. 2. Likely chronic left occipital infarct and mild for age volume loss without lobar predominance.   Electronically Signed   By: Ulyses Jarred M.D.  Physical exam:  General: The patient is awake, alert and appears not in acute distress. The patient is well groomed. Head: Normocephalic, atraumatic. Neck is supple. Mallampati 3   Cardiovascular:   without murmurs or carotid bruit, and without distended neck veins. 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  Respiratory: Lungs are clear to auscultation. Skin:  Without evidence of edema, or rash Trunk: BMI is normal . The patient's posture is erect, she walks with a cane.   Neurologic exam : The patient is awake and alert, oriented to place and time.   Memory subjective described as intact.   Attention span & concentration ability appears normal.  Speech is fluent,  without dysarthria, dysphonia or aphasia.  Mood and affect are appropriate.  Cranial nerves: The patient has lost her sense of smell and taste. She believes this has been present for several years and is not a recent development. Pupils are equal and briskly reactive to light. Funduscopic exam without  evidence of  pallor or edema.  Extraocular movements  in vertical and horizontal planes intact and without nystagmus.  Visual fields by finger perimetry - peripheral visual field restriction on the right Hearing to finger rub intact. Facial sensation intact to fine touch.Facial motor strength is symmetric and tongue and uvula move midline. Shoulder shrug was symmetrical.    The patient was advised of the nature of the diagnosed CVA , the treatment options and risks  for general a health and wellness arising from not treating the condition.  I spent more than 25 minutes of face to face time with the patient. Greater than 50% of time was spent in counseling and coordination of care. We have discussed the diagnosis and differential and I answered the patient's questions.     Assessment:  After physical and neurologic examination, review of laboratory studies,  Personal review of imaging studies, reports of other /same  Imaging studies ,  Results of polysomnography/ neurophysiology testing and pre-existing records as far as provided in visit., my assessment is   1) Alexa Mcdonald has a largely unremarkable neurologic examination, except for homonymous hemianopsia. She has not been driving since Summer. She has clearly as central occipital left sided brain stroke, most likely embolic in nature. Her dizzy spell would correlate with the onset. In addition her MRI showed some atrophy which is age expected. We do not evaluate today a cognitive baseline, I would like for the patient to remain on her anticoagulants. I will not add an antiplatelet. She has completed physical therapy at the home she had 6 sessions in the privacy of her home and feels that this has improved her gait and balance.   Plan:  Treatment plan and additional workup : continue follow up with cardiology, may follow with dr Leonie Man and Erlinda Hong yearly in stroke clinic. Not enrolled in any research trial.     Asencion Partridge Pranish Akhavan MD  06/11/2016   CC: Jonathon Jordan, Md [address] [place], [place] [address]

## 2016-06-18 ENCOUNTER — Ambulatory Visit (HOSPITAL_COMMUNITY): Payer: Medicare Other

## 2016-06-20 ENCOUNTER — Encounter: Payer: Self-pay | Admitting: Rehabilitation

## 2016-06-20 NOTE — Therapy (Signed)
[facility] [address] [place], [place], [address] Phone: [phone]   Fax:  [phone]  Patient Details  Name: Alexa Mcdonald MRN: [id number] Date of Birth: July 22, 1921 Referring Provider:  No ref. provider found  Encounter Date: 06/20/2016    PHYSICAL THERAPY DISCHARGE SUMMARY  Visits from Start of Care: 1  Current functional level related to goals / functional outcomes: Unable to state due to pt not returning for follow up visits.   Remaining deficits:     PT Long Term Goals - 03/28/16 1412      PT LONG TERM GOAL #1   Title Pt will be independent with HEP in order to indicate improved functional mobility and decreased fall risk.  (Target Date: 05/09/16)   Time 6   Period Weeks   Status New     PT LONG TERM GOAL #2   Title Pt will verbalize compliance with walking program (either in home or community) in order to indicate improved functional endurance.     Time 6   Period Weeks   Status New     PT LONG TERM GOAL #3   Title Pt will ambulate 500' over unlevel paved outdoor surfaces at mod I level in order to indicate safe community negotiation.     Time 6   Period Weeks   Status New     PT LONG TERM GOAL #4   Title Pt will improve 6 MWT distance by 150' from STG distance in order to indicate improved functional endurance.     Time 6   Period Weeks   Status New        Education / Equipment: None  Plan: Patient agrees to discharge.  Patient goals were not met. Patient is being discharged due to not returning since the last visit.  ?????          Cameron Sprang, PT, MPT [facility] [address] [place], [place], [address] Phone: [phone]   Fax:  [phone] 06/20/16, 12:39 PM

## 2016-06-20 NOTE — Therapy (Signed)
[facility] [address] [place], [place], [address] Phone: [phone]   Fax:  [phone]  Patient Details  Name: Alexa Mcdonald MRN: [id number] Date of Birth: 06/10/1921 Referring Provider:  No ref. provider found  Encounter Date: 06/20/2016  Encounter entered in error.    Denice Bors 06/20/2016, 1:01 PM  [facility] [address] [place], [place], [address] Phone: [phone]   Fax:  [phone]

## 2016-06-23 ENCOUNTER — Ambulatory Visit (HOSPITAL_COMMUNITY): Payer: Medicare Other | Attending: Cardiovascular Disease

## 2016-06-23 ENCOUNTER — Other Ambulatory Visit: Payer: Self-pay

## 2016-06-23 DIAGNOSIS — I5032 Chronic diastolic (congestive) heart failure: Secondary | ICD-10-CM

## 2016-06-23 DIAGNOSIS — I447 Left bundle-branch block, unspecified: Secondary | ICD-10-CM | POA: Insufficient documentation

## 2016-06-23 DIAGNOSIS — I481 Persistent atrial fibrillation: Secondary | ICD-10-CM | POA: Diagnosis not present

## 2016-06-23 DIAGNOSIS — I4819 Other persistent atrial fibrillation: Secondary | ICD-10-CM

## 2016-06-23 DIAGNOSIS — E119 Type 2 diabetes mellitus without complications: Secondary | ICD-10-CM | POA: Insufficient documentation

## 2016-06-23 DIAGNOSIS — I110 Hypertensive heart disease with heart failure: Secondary | ICD-10-CM | POA: Diagnosis not present

## 2016-06-23 DIAGNOSIS — I340 Nonrheumatic mitral (valve) insufficiency: Secondary | ICD-10-CM | POA: Insufficient documentation

## 2016-06-23 DIAGNOSIS — I350 Nonrheumatic aortic (valve) stenosis: Secondary | ICD-10-CM | POA: Diagnosis not present

## 2016-09-03 ENCOUNTER — Other Ambulatory Visit: Payer: Self-pay

## 2016-09-03 DIAGNOSIS — I350 Nonrheumatic aortic (valve) stenosis: Secondary | ICD-10-CM

## 2016-09-03 DIAGNOSIS — Z952 Presence of prosthetic heart valve: Secondary | ICD-10-CM

## 2016-09-10 DIAGNOSIS — E0590 Thyrotoxicosis, unspecified without thyrotoxic crisis or storm: Secondary | ICD-10-CM | POA: Diagnosis not present

## 2016-09-10 DIAGNOSIS — E0520 Thyrotoxicosis with toxic multinodular goiter without thyrotoxic crisis or storm: Secondary | ICD-10-CM | POA: Diagnosis not present

## 2016-09-10 DIAGNOSIS — Z5181 Encounter for therapeutic drug level monitoring: Secondary | ICD-10-CM | POA: Diagnosis not present

## 2016-09-19 DIAGNOSIS — E119 Type 2 diabetes mellitus without complications: Secondary | ICD-10-CM | POA: Diagnosis not present

## 2016-09-19 DIAGNOSIS — H52222 Regular astigmatism, left eye: Secondary | ICD-10-CM | POA: Diagnosis not present

## 2016-09-19 DIAGNOSIS — H04123 Dry eye syndrome of bilateral lacrimal glands: Secondary | ICD-10-CM | POA: Diagnosis not present

## 2016-09-19 DIAGNOSIS — H16223 Keratoconjunctivitis sicca, not specified as Sjogren's, bilateral: Secondary | ICD-10-CM | POA: Diagnosis not present

## 2016-09-19 DIAGNOSIS — H353131 Nonexudative age-related macular degeneration, bilateral, early dry stage: Secondary | ICD-10-CM | POA: Diagnosis not present

## 2016-09-19 DIAGNOSIS — H5212 Myopia, left eye: Secondary | ICD-10-CM | POA: Diagnosis not present

## 2016-10-17 DIAGNOSIS — E7800 Pure hypercholesterolemia, unspecified: Secondary | ICD-10-CM | POA: Diagnosis not present

## 2016-10-17 DIAGNOSIS — Z8673 Personal history of transient ischemic attack (TIA), and cerebral infarction without residual deficits: Secondary | ICD-10-CM | POA: Diagnosis not present

## 2016-10-24 ENCOUNTER — Ambulatory Visit: Payer: Self-pay | Admitting: Pharmacist

## 2016-10-24 DIAGNOSIS — Z5181 Encounter for therapeutic drug level monitoring: Secondary | ICD-10-CM

## 2016-10-24 DIAGNOSIS — H16223 Keratoconjunctivitis sicca, not specified as Sjogren's, bilateral: Secondary | ICD-10-CM | POA: Diagnosis not present

## 2016-10-24 DIAGNOSIS — H40012 Open angle with borderline findings, low risk, left eye: Secondary | ICD-10-CM | POA: Diagnosis not present

## 2016-10-24 DIAGNOSIS — H04123 Dry eye syndrome of bilateral lacrimal glands: Secondary | ICD-10-CM | POA: Diagnosis not present

## 2016-10-30 ENCOUNTER — Encounter: Payer: Self-pay | Admitting: Cardiovascular Disease

## 2016-11-14 ENCOUNTER — Encounter: Payer: Self-pay | Admitting: Cardiovascular Disease

## 2016-11-14 ENCOUNTER — Ambulatory Visit (INDEPENDENT_AMBULATORY_CARE_PROVIDER_SITE_OTHER): Payer: Medicare Other | Admitting: Cardiovascular Disease

## 2016-11-14 ENCOUNTER — Encounter: Payer: Self-pay | Admitting: *Deleted

## 2016-11-14 ENCOUNTER — Encounter (INDEPENDENT_AMBULATORY_CARE_PROVIDER_SITE_OTHER): Payer: Self-pay

## 2016-11-14 ENCOUNTER — Other Ambulatory Visit: Payer: Self-pay

## 2016-11-14 ENCOUNTER — Ambulatory Visit (HOSPITAL_COMMUNITY): Payer: Medicare Other | Attending: Cardiovascular Disease

## 2016-11-14 VITALS — BP 140/76 | HR 60 | Ht 61.0 in | Wt 135.0 lb

## 2016-11-14 DIAGNOSIS — Z952 Presence of prosthetic heart valve: Secondary | ICD-10-CM

## 2016-11-14 DIAGNOSIS — N189 Chronic kidney disease, unspecified: Secondary | ICD-10-CM | POA: Diagnosis not present

## 2016-11-14 DIAGNOSIS — Z953 Presence of xenogenic heart valve: Secondary | ICD-10-CM | POA: Insufficient documentation

## 2016-11-14 DIAGNOSIS — E1122 Type 2 diabetes mellitus with diabetic chronic kidney disease: Secondary | ICD-10-CM | POA: Diagnosis not present

## 2016-11-14 DIAGNOSIS — I350 Nonrheumatic aortic (valve) stenosis: Secondary | ICD-10-CM

## 2016-11-14 DIAGNOSIS — I480 Paroxysmal atrial fibrillation: Secondary | ICD-10-CM | POA: Insufficient documentation

## 2016-11-14 DIAGNOSIS — Z006 Encounter for examination for normal comparison and control in clinical research program: Secondary | ICD-10-CM

## 2016-11-14 DIAGNOSIS — I083 Combined rheumatic disorders of mitral, aortic and tricuspid valves: Secondary | ICD-10-CM | POA: Diagnosis not present

## 2016-11-14 MED ORDER — CLINDAMYCIN HCL 300 MG PO CAPS: ORAL_CAPSULE | ORAL | 2 refills | Status: DC

## 2016-11-14 NOTE — Progress Notes (Signed)
Cardiology Office Note Date:  11/14/2016   ID:  Alexa Mcdonald, DOB [birth year], MRN [id number]  PCP:  [name], MD  Cardiologist:  [name], MD    Chief Complaint  Patient presents with  . Aortic Stenosis    sever. S/P TAVR     History of Present Illness: Alexa Mcdonald is a 83 y.o. female who presents for one year TAVR follow-up. Whe underwent TAVR 4-25-2017with a 23 mm Edwards Sapien 3 THV via percutaneous right trasfemoral access. She has a history of hypertension, paroxysmal atrial fibrillation, and stage III chronic kidney disease. She had developed progressive shortness of breath with low-level activity (NYHA functional class III) and was found to have both a critical aortic stenosis and severe mitral regurgitation. Preoperative cardiac catheterization showed mild nonobstructive CAD. She had no immediate post-operative complications but did complain of dizziness at follow-up. A chronic left occipital stroke was identified on CT and MRI imaging. She has been evaluated by Dr Brett Fairy and has undergone physical therapy.  The patient denies chest pain or shortness of breath. She is not as active as she used to be. She complains of dizziness. She's had no frank syncope. She's compliant with her medications.  Past Medical History:  Diagnosis Date  . 1st degree AV block   . Bradycardia   . Chronic diastolic heart failure ([place]) 02/07/2016  . CKD (chronic kidney disease), stage III   . Colon, diverticulosis   . Diabetes mellitus    diet controlled  . Elevated brain natriuretic peptide (BNP) level   . Esophageal reflux   . Essential hypertension   . GERD (gastroesophageal reflux disease)   . Hematochezia   . HOH (hard of hearing)    wears bilateral hearing aids  . Hx of orthostatic hypotension   . Interstitial lung disease ([place])   . LBBB (left bundle branch block)   . Long term current use of anticoagulant therapy   . Mediastinal goiter     recurrent s/p  resection 1988  . Mitral regurgitation   . Osteopenia   . Osteoporosis   . Paroxysmal atrial fibrillation ([facility])    a. CHA2DS2VASc = 5-->coumadin;  b. previously on amio->d/c'd 11/2014 secondary to concern for possible amio lung.  . Pneumonia   . 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 Severe aortic stenosis   . Shortness of breath dyspnea    for last 3 weeks - with any activity  . Tachycardia-bradycardia ([place])   . Vitamin D deficiency     Past Surgical History:  Procedure Laterality Date  . CARDIAC CATHETERIZATION N/A 10/31/2015   Procedure: Right/Left Heart Cath and Coronary Angiography;  Surgeon: [name], MD;  Location: [facility];  Service: Cardiovascular;  Laterality: N/A;  . CARPAL TUNNEL RELEASE Bilateral   . CATARACT EXTRACTION Bilateral   . CESAREAN SECTION     x3  . mediastinal removal of a goiter    . MRI  08/18/07   head (diabetes heart study at [facility])  . TEE WITHOUT CARDIOVERSION N/A 11/27/2015   Procedure: TRANSESOPHAGEAL ECHOCARDIOGRAM (TEE);  Surgeon: [name], MD;  Location: [facility];  Service: Open Heart Surgery;  Laterality: N/A;  . TRANSCATHETER AORTIC VALVE REPLACEMENT, TRANSFEMORAL N/A 11/27/2015   Procedure: TRANSCATHETER AORTIC VALVE REPLACEMENT, TRANSFEMORAL;  Surgeon: [name], MD;  Location: [facility];  Service: Open Heart Surgery;  Laterality: N/A;  Current Outpatient Prescriptions  Medication Sig Dispense Refill  . aspirin 81 MG chewable tablet Chew 1 tablet (81 mg total) by mouth daily.    . Calcium Carb-Cholecalciferol (CALCIUM 600/VITAMIN D3 PO) Take 1 tablet by mouth 2 (two) times daily.    [name] edoxaban (SAVAYSA) 30 MG TABS tablet Take 30 mg by mouth daily.    . furosemide (LASIX) 20 MG tablet Take 20mg  daily as needed for edema or weigh gain (3 lbs in 24 hours or 5 lbs in 1 week) 30 tablet   . lisinopril  (PRINIVIL,ZESTRIL) 20 MG tablet Take 1 tablet (20 mg total) by mouth daily. 30 tablet 11  . methimazole (TAPAZOLE) 10 MG tablet Take 5 mg by mouth daily. Take 1/2 tablet by mouth once daily    . Multiple Vitamins-Minerals (OCUVITE PRESERVISION PO) Take 1 capsule by mouth daily.     . pravastatin (PRAVACHOL) 10 MG tablet Take 10 mg by mouth daily.    . clindamycin (CLEOCIN) 300 MG capsule Take 2 capsules by mouth one hour prior to dental appointment 4 capsule 2   No current facility-administered medications for this visit.     Allergies:   Amoxicillin and Celebrex [celecoxib]   Social History:  The patient  reports that she has never smoked. She has never used smokeless tobacco. She reports that she does not drink alcohol or use drugs.   Family History:  The patient's  family history includes Arrhythmia in her brother; CAD in her brother; Cancer - Prostate in her father; Diabetes in her mother and sister; Hypertension in her brother and sister; Pulmonary embolism in her father; Pulmonary fibrosis in her brother.    ROS:  Please see the history of present illness.  Otherwise, review of systems is positive for dizziness, hearing loss.  All other systems are reviewed and negative.    PHYSICAL EXAM: VS:  BP 140/76   Pulse 60   Ht 5\' 1"  (1.549 m)   Wt 135 lb (61.2 kg)   BMI 25.51 kg/m  , BMI Body mass index is 25.51 kg/m. GEN: Well nourished, well developed, in no acute distress  HEENT: normal  Neck: no JVD, no masses. No carotid bruits Cardiac: RRR with 2/6 SEM at the RUSB Respiratory:  clear to auscultation bilaterally, normal work of breathing GI: soft, nontender, nondistended, + BS MS: no deformity or atrophy  Ext: no pretibial edema, pedal pulses 2+= bilaterally Skin: warm and dry, no rash Neuro:  Strength and sensation are intact Psych: euthymic mood, full affect  EKG:  EKG is ordered today. The ekg ordered today shows NSR 59 bpm, LBBB  Recent Labs: 11/26/2015: ALT  14 11/28/2015: Magnesium 2.5 12/24/2015: BUN 26; Creat 1.14; Hemoglobin 12.2; Platelets 202; Potassium 4.4; Sodium 139   Lipid Panel  No results found for: CHOL, TRIG, HDL, CHOLHDL, VLDL, LDLCALC, LDLDIRECT    Wt Readings from Last 3 Encounters:  11/14/16 135 lb (61.2 kg)  06/11/16 134 lb (60.8 kg)  05/27/16 130 lb (59 kg)     Cardiac Studies Reviewed: 2-D echocardiogram is reviewed. Formal interpretation is pending. LV function appears to be normal. Transcatheter aortic valve function appears normal with no significant paravalvular regurgitation, mean transvalvular gradient of 15 mmHg, and peak transvalvular gradient of 26 mmHg. The left atrium is markedly dilated.  ASSESSMENT AND PLAN: Aortic valve disease s/p TAVR: NYHA 1 symptoms of chronic diastolic heart failure. Her functional status is limited from noncardiac issues (dizziness). Transvalvular gradients are unchanged from her 30 day  post-TAVR echo. She understands to continue to follow with SBE prophylaxis per guidelines. She continues on Edoxaban for anticoagulation of paroxysmal atrial fibrillation as part of the Envisage-TAVI Trial.   Current medicines are reviewed with the patient today.  The patient does not have concerns regarding medicines.  Labs/ tests ordered today include:   Orders Placed This Encounter  Procedures  . EKG 12-Lead    Disposition:   FU Dr [name] as planned  Signed, [name], MD  11/14/2016 5:49 PM    [facility], [place], [place]  [address] Phone: [phone]; Fax: [phone]

## 2016-11-14 NOTE — Patient Instructions (Addendum)
Medication Instructions:  Your physician recommends that you continue on your current medications as directed. Please refer to the Current Medication list given to you today.  Labwork: No new orders.   Testing/Procedures: No new orders.   Follow-Up: Your physician wants you to follow-up in: 10 MONTHS with Dr [name].  You will receive a reminder letter in the mail two months in advance. If you don't receive a letter, please call our office to schedule the follow-up appointment.   Any Other Special Instructions Will Be Listed Below (If Applicable).  Your physician discussed the importance of taking an antibiotic prior to any dental, gastrointestinal, genitourinary procedures to prevent damage to the heart valves from infection. You were given a prescription for an antibiotic based on current SBE prophylaxis guidelines. Clindamycin 300mg  capsules, take 2 capsules by mouth one hour prior to dental appointment      If you need a refill on your cardiac medications before your next appointment, please call your pharmacy.

## 2016-11-14 NOTE — Progress Notes (Signed)
Envisage Study  I saw patient for 28-month visit for Envisage Study. Patient is 94% compliant with study drug. Edoxaban study medication dispensed to patient. Patient returned all study medication from last visit. Reminded patient to take study medication each day. I will see patient back on Oct. 19, 2018 at 1 pm. Safety labs drawn for study.

## 2016-11-18 ENCOUNTER — Telehealth: Payer: Self-pay | Admitting: *Deleted

## 2016-11-18 NOTE — Telephone Encounter (Signed)
I called patient to let her know 70-month research labs looked fine. Dr [name] reviewed labs. I reminded patient to take her study medication one pill every day. I will see patient back in 6 months for next study visit.

## 2017-01-02 ENCOUNTER — Other Ambulatory Visit: Payer: Self-pay | Admitting: Cardiovascular Disease

## 2017-01-02 DIAGNOSIS — H16223 Keratoconjunctivitis sicca, not specified as Sjogren's, bilateral: Secondary | ICD-10-CM | POA: Diagnosis not present

## 2017-01-02 DIAGNOSIS — Z952 Presence of prosthetic heart valve: Secondary | ICD-10-CM

## 2017-01-02 DIAGNOSIS — I350 Nonrheumatic aortic (valve) stenosis: Secondary | ICD-10-CM

## 2017-01-02 DIAGNOSIS — H02831 Dermatochalasis of right upper eyelid: Secondary | ICD-10-CM | POA: Diagnosis not present

## 2017-01-02 DIAGNOSIS — H40012 Open angle with borderline findings, low risk, left eye: Secondary | ICD-10-CM | POA: Diagnosis not present

## 2017-01-02 DIAGNOSIS — H04123 Dry eye syndrome of bilateral lacrimal glands: Secondary | ICD-10-CM | POA: Diagnosis not present

## 2017-03-13 DIAGNOSIS — E0520 Thyrotoxicosis with toxic multinodular goiter without thyrotoxic crisis or storm: Secondary | ICD-10-CM | POA: Diagnosis not present

## 2017-03-13 DIAGNOSIS — E0590 Thyrotoxicosis, unspecified without thyrotoxic crisis or storm: Secondary | ICD-10-CM | POA: Diagnosis not present

## 2017-03-13 DIAGNOSIS — Z5181 Encounter for therapeutic drug level monitoring: Secondary | ICD-10-CM | POA: Diagnosis not present

## 2017-03-18 DIAGNOSIS — E0590 Thyrotoxicosis, unspecified without thyrotoxic crisis or storm: Secondary | ICD-10-CM | POA: Diagnosis not present

## 2017-03-18 DIAGNOSIS — Z5181 Encounter for therapeutic drug level monitoring: Secondary | ICD-10-CM | POA: Diagnosis not present

## 2017-03-18 DIAGNOSIS — E0520 Thyrotoxicosis with toxic multinodular goiter without thyrotoxic crisis or storm: Secondary | ICD-10-CM | POA: Diagnosis not present

## 2017-04-02 ENCOUNTER — Encounter: Payer: Self-pay | Admitting: Cardiology

## 2017-04-10 DIAGNOSIS — H04123 Dry eye syndrome of bilateral lacrimal glands: Secondary | ICD-10-CM | POA: Diagnosis not present

## 2017-04-10 DIAGNOSIS — H40012 Open angle with borderline findings, low risk, left eye: Secondary | ICD-10-CM | POA: Diagnosis not present

## 2017-04-10 DIAGNOSIS — H16223 Keratoconjunctivitis sicca, not specified as Sjogren's, bilateral: Secondary | ICD-10-CM | POA: Diagnosis not present

## 2017-04-10 DIAGNOSIS — H02831 Dermatochalasis of right upper eyelid: Secondary | ICD-10-CM | POA: Diagnosis not present

## 2017-04-17 DIAGNOSIS — Z23 Encounter for immunization: Secondary | ICD-10-CM | POA: Diagnosis not present

## 2017-04-17 DIAGNOSIS — N183 Chronic kidney disease, stage 3 (moderate): Secondary | ICD-10-CM | POA: Diagnosis not present

## 2017-04-17 DIAGNOSIS — I481 Persistent atrial fibrillation: Secondary | ICD-10-CM | POA: Diagnosis not present

## 2017-04-17 DIAGNOSIS — Z79899 Other long term (current) drug therapy: Secondary | ICD-10-CM | POA: Diagnosis not present

## 2017-04-17 DIAGNOSIS — M810 Age-related osteoporosis without current pathological fracture: Secondary | ICD-10-CM | POA: Diagnosis not present

## 2017-04-17 DIAGNOSIS — Z0000 Encounter for general adult medical examination without abnormal findings: Secondary | ICD-10-CM | POA: Diagnosis not present

## 2017-04-17 DIAGNOSIS — E559 Vitamin D deficiency, unspecified: Secondary | ICD-10-CM | POA: Diagnosis not present

## 2017-04-17 DIAGNOSIS — E1122 Type 2 diabetes mellitus with diabetic chronic kidney disease: Secondary | ICD-10-CM | POA: Diagnosis not present

## 2017-04-17 DIAGNOSIS — E7800 Pure hypercholesterolemia, unspecified: Secondary | ICD-10-CM | POA: Diagnosis not present

## 2017-04-17 DIAGNOSIS — Z952 Presence of prosthetic heart valve: Secondary | ICD-10-CM | POA: Diagnosis not present

## 2017-04-17 DIAGNOSIS — I10 Essential (primary) hypertension: Secondary | ICD-10-CM | POA: Diagnosis not present

## 2017-05-01 DIAGNOSIS — I10 Essential (primary) hypertension: Secondary | ICD-10-CM | POA: Diagnosis not present

## 2017-05-01 DIAGNOSIS — R42 Dizziness and giddiness: Secondary | ICD-10-CM | POA: Diagnosis not present

## 2017-05-01 DIAGNOSIS — G3184 Mild cognitive impairment, so stated: Secondary | ICD-10-CM | POA: Diagnosis not present

## 2017-05-14 ENCOUNTER — Ambulatory Visit: Payer: Medicare Other | Admitting: Cardiology

## 2017-05-29 ENCOUNTER — Encounter: Payer: Self-pay | Admitting: *Deleted

## 2017-05-29 DIAGNOSIS — Z006 Encounter for examination for normal comparison and control in clinical research program: Secondary | ICD-10-CM

## 2017-06-05 NOTE — Progress Notes (Addendum)
Late Entry:  Patient came in for her 43 month follow up appointment for ENVISAGE study. Pt compliant on medication was 82%. She live by her self and says she fills her own med box. No complaints of chest pain or sob. Patient is now taking Atorvastatin instead of Pravastatin; also she is now on Lisinopril/HCTZ instead of Lisinopril.  BMP and CBC drawn today.  New bottles of medication given to patient and explained the importance of taking the medication. Will fax labs to pt PCP also. Updated med list per patient. Will request an updated med list from PCP. We will see patient back in office on April 19 th for her 24 month follow up. Lab results are below:

## 2017-06-23 ENCOUNTER — Ambulatory Visit: Payer: Medicare Other | Admitting: Cardiology

## 2017-06-24 ENCOUNTER — Encounter: Payer: Self-pay | Admitting: Cardiology

## 2017-07-08 ENCOUNTER — Telehealth: Payer: Self-pay | Admitting: Cardiology

## 2017-07-08 NOTE — Telephone Encounter (Signed)
New message  appt 12.6.2018 @ 2 pm    STAT if patient feels like he/she is going to faint   1) Are you dizzy now?  Per [facility] calling  - no calm down a little bit    2) Do you feel faint or have you passed out? Per [facility] -no   3) Do you have any other symptoms? Per [facility] - no good day / bad days   4) Have you checked your HR and BP (record if available)? Per [facility] -no blood pressure machine at home.

## 2017-07-08 NOTE — Telephone Encounter (Signed)
Returned call to patient. She states she feels dizzy and doesn't feel steady on her feet. She states symptoms have been going on for about a week. Patent's BP was 126/86, did not check HR. She denies chest pain, sob, or syncope. Patient has an appointment with Melina Copa, PA on 07/09/17. Informed patient to follow up with appointment. Encouraged patient to go to the ED if she feels like she is going to pass out. Patient verbalized understanding and thanked me for the call.

## 2017-07-09 ENCOUNTER — Ambulatory Visit (INDEPENDENT_AMBULATORY_CARE_PROVIDER_SITE_OTHER): Payer: Medicare Other | Admitting: Physician Assistant

## 2017-07-09 ENCOUNTER — Encounter: Payer: Self-pay | Admitting: Physician Assistant

## 2017-07-09 ENCOUNTER — Telehealth: Payer: Self-pay | Admitting: Physician Assistant

## 2017-07-09 VITALS — BP 124/62 | HR 64 | Ht 61.0 in | Wt 138.0 lb

## 2017-07-09 DIAGNOSIS — I2510 Atherosclerotic heart disease of native coronary artery without angina pectoris: Secondary | ICD-10-CM | POA: Diagnosis not present

## 2017-07-09 DIAGNOSIS — I5032 Chronic diastolic (congestive) heart failure: Secondary | ICD-10-CM | POA: Diagnosis not present

## 2017-07-09 DIAGNOSIS — R42 Dizziness and giddiness: Secondary | ICD-10-CM

## 2017-07-09 DIAGNOSIS — I10 Essential (primary) hypertension: Secondary | ICD-10-CM

## 2017-07-09 DIAGNOSIS — I350 Nonrheumatic aortic (valve) stenosis: Secondary | ICD-10-CM | POA: Diagnosis not present

## 2017-07-09 DIAGNOSIS — I480 Paroxysmal atrial fibrillation: Secondary | ICD-10-CM

## 2017-07-09 DIAGNOSIS — Z952 Presence of prosthetic heart valve: Secondary | ICD-10-CM | POA: Diagnosis not present

## 2017-07-09 MED ORDER — LISINOPRIL 20 MG PO TABS: 20.0000 mg | ORAL_TABLET | Freq: Every day | ORAL | 3 refills | Status: DC

## 2017-07-09 MED ORDER — LISINOPRIL-HYDROCHLOROTHIAZIDE 10-12.5 MG PO TABS: 1.0000 | ORAL_TABLET | Freq: Every day | ORAL | 1 refills | Status: DC

## 2017-07-09 NOTE — Telephone Encounter (Signed)
°  New Prob  Calling to verify Lisinopril prescription. States 2 different scripts were received. Please call.

## 2017-07-09 NOTE — Telephone Encounter (Signed)
Returned pharmacies call and to let them that we had d/c'd the lisinopril-hctz and gave pt regular Lisinopril 20 mg daily. They thanked me for the return call.

## 2017-07-09 NOTE — Progress Notes (Addendum)
Cardiology Office Note    Date:  07/09/2017  ID:  Alexa Mcdonald, DOB 1921-07-22, MRN [id number] PCP:  Jonathon Jordan, MD  Cardiologist: Dr. [name]   Chief Complaint: f/u TAVR  History of Present Illness:  Alexa Mcdonald is a 83 y.o. female with history of severe aortic stenosis s/p TAVR 11/2015, PAF, 1st degree AV block, HTN, CKD stage III, mild nonobstructive CAD, DM, esophageal reflux, chronic diastolic CHF, orthostatic hypotension, ILD, LBBB, mediastinal goiter s/p resection, osteoporosis/osteopenia, stroke (by CT) who presents for follow-up.  She underwent TAVR 11-27-2015 with a 23 mm Edwards Sapien 3 THV via percutaneous right trasfemoral access. She had developed progressive shortness of breath with low-level activity (NYHA functional class III) and was found to have both a critical aortic stenosis and severe mitral regurgitation. Preoperative cardiac catheterization showed mild nonobstructive CAD (40% ostial LM, 25% mCx, 40% D2, 25% dLAD). She had no immediate post-operative complications but did complain of dizziness at follow-up. A chronic left occipital stroke was identified on CT and MRI imaging. She has been evaluated by Dr Brett Fairy and has undergone physical therapy. Last labs in Epic 04/2017 showed A1C 7, LDL 70. She had labs scanned into a research encounter from 05/2017 showing Na 136, K 4.2, BUN 47, Cr 1.61, glucose 199,Hgb 12.5, Hct 37.7. Prior Cr appears around 1.2. 2D echo 11/2016: mild LVH, EF 60-65%, grade 1 DD, mild MR/AR, severe LAE, PASP 43mmHg.  She presents for follow-up today with her caregiver Alexa Mcdonald. In general they feel she is doing well. She thinks she's doing great considering her age. She does have good days and bad days with dizziness upon standing at times. No falls. No CP, dyspnea, edema, palpitations or syncope. Denies bleeding.   Past Medical History:  Diagnosis Date  . 1st degree AV block   . Bradycardia   . Chronic diastolic heart failure ([place])  02/07/2016  . CKD (chronic kidney disease), stage III ([place])   . Colon, diverticulosis   . Diabetes mellitus    diet controlled  . Esophageal reflux   . Essential hypertension   . GERD (gastroesophageal reflux disease)   . Hematochezia   . HOH (hard of hearing)    wears bilateral hearing aids  . Hx of orthostatic hypotension   . Interstitial lung disease ([place])   . LBBB (left bundle branch block)   . Long term current use of anticoagulant therapy   . Mediastinal goiter     recurrent s/p resection 1988  . Mild CAD   . Mitral regurgitation   . Osteopenia   . Osteoporosis   . Paroxysmal atrial fibrillation ([facility])    a. CHA2DS2VASc = 5-->coumadin;  b. previously on amio->d/c'd 11/2014 secondary to concern for possible amio lung.  . Pneumonia   . 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 Severe aortic stenosis   . Stroke ([place])   . Tachycardia-bradycardia ([place])   . Vitamin D deficiency     Past Surgical History:  Procedure Laterality Date  . CARDIAC CATHETERIZATION N/A 10/31/2015   Procedure: Right/Left Heart Cath and Coronary Angiography;  Surgeon: [name], MD;  Location: [facility];  Service: Cardiovascular;  Laterality: N/A;  . CARPAL TUNNEL RELEASE Bilateral   . CATARACT EXTRACTION Bilateral   . CESAREAN SECTION     x3  . mediastinal removal of a goiter    . MRI  08/18/07  head (di[facility] at [facility])  . TEE WITHOUT CARDIOVERSION N/A 11/27/2015   Procedure: TRANSESOPHAGEAL ECHOCARDIOGRAM (TEE);  Surgeon: [name], MD;  Location: [facility];  Service: Open Heart Surgery;  Laterality: N/A;  . TRANSCATHETER AORTIC VALVE REPLACEMENT, TRANSFEMORAL N/A 11/27/2015   Procedure: TRANSCATHETER AORTIC VALVE REPLACEMENT, TRANSFEMORAL;  Surgeon: [name], MD;  Location: [facility];  Service: Open Heart Surgery;  Laterality: N/A;    Current  Medications: Current Meds  Medication Sig  . aspirin 81 MG chewable tablet Chew 1 tablet (81 mg total) by mouth daily.  [name] atorvastatin (LIPITOR) 10 MG tablet Take 10 mg daily by mouth.  . Calcium Carb-Cholecalciferol (CALCIUM 600/VITAMIN D3 PO) Take 1 tablet by mouth 2 (two) times daily.  . clindamycin (CLEOCIN) 300 MG capsule Take 2 capsules by mouth one hour prior to dental appointment  . edoxaban (SAVAYSA) 30 MG TABS tablet Take 30 mg by mouth daily.  . furosemide (LASIX) 20 MG tablet Take 20mg  daily as needed for edema or weigh gain (3 lbs in 24 hours or 5 lbs in 1 week)  . lisinopril-hydrochlorothiazide (PRINZIDE,ZESTORETIC) 20-12.5 MG tablet Take 1 tablet by mouth daily.  . methimazole (TAPAZOLE) 10 MG tablet Take 5 mg by mouth daily. Take 1/2 tablet by mouth once daily  . Multiple Vitamins-Minerals (OCUVITE PRESERVISION PO) Take 1 capsule by mouth daily.      Allergies:   Amoxicillin and Celebrex [celecoxib]   Social History   Socioeconomic History  . Marital status: Widowed    Spouse name: None  . Number of children: 3  . Years of education: None  . Highest education level: None  Social Needs  . Financial resource strain: None  . Food insecurity - worry: None  . Food insecurity - inability: None  . Transportation needs - medical: None  . Transportation needs - non-medical: None  Occupational History  . Occupation: retire  Tobacco Use  . Smoking status: Never Smoker  . Smokeless tobacco: Never Used  Substance and Sexual Activity  . Alcohol use: No    Alcohol/week: 0.0 oz  . Drug use: No  . Sexual activity: None  Other Topics Concern  . None  Social History Narrative   Lives in [place].  Widowed.     Family History:  Family History  Problem Relation Age of Onset  . Diabetes Mother   . Cancer - Prostate Father   . Pulmonary embolism Father   . Hypertension Sister   . Diabetes Sister   . Hypertension Brother   . Arrhythmia Brother        afib  . CAD  Brother   . Pulmonary fibrosis Brother     ROS:   Please see the history of present illness. .  All other systems are reviewed and otherwise negative.    PHYSICAL EXAM:   VS:  BP 124/62   Pulse 64   Ht 5\' 1"  (1.549 m)   Wt 138 lb (62.6 kg)   SpO2 98%   BMI 26.07 kg/m   BMI: Body mass index is 26.07 kg/m. GEN: Well nourished, well developed well appearing WF, in no acute distress  HEENT: normocephalic, atraumatic Neck: no JVD, carotid bruits, or masses Cardiac:RRR; 2/6 SEM, no rubs or gallops, no edema  Respiratory:  clear to auscultation bilaterally, normal work of breathing GI: soft, nontender, nondistended, + BS MS: no deformity or atrophy  Skin: warm and dry, no rash Neuro:  Alert and Oriented x 3, Strength and sensation are intact,  follows commands Psych: euthymic mood, full affect  Wt Readings from Last 3 Encounters:  07/09/17 138 lb (62.6 kg)  05/29/17 135 lb (61.2 kg)  11/14/16 135 lb (61.2 kg)      Studies/Labs Reviewed:   EKG:  EKG was ordered today and personally reviewed by me and demonstrates sinus bradycardia 59bpm LBBB, similar to prior.  Additional studies/ records that were reviewed today include: Summarized above.  ASSESSMENT & PLAN:   1. Severe AS s/p TAVR - overall doing well. Mitral regurgitation also improved with TAVR. Per d/w Dr. [name], [place] to stop aspirin. Reviewed SBE prophylaxis. 2. Dizziness - possibly related to h/o orthostasis. Labs in 05/2017 suggested dehydration. She has no evidence of fluid overload. Will stop HCTZ component of BP med and follow. No falls recently. If this continues can consider cutting lisinopril down to 10mg  daily. Check BMET today to trend recent Cr elevation. 3. Mild CAD - no recent anginal symptoms. Follow. Continue statin. 4. HTN - follow with med changes.  5. PAF - maintaining NSR. [facility] is followed by research team. Recent CBC OK. 6. Chronic diastolic CHF - previously related to AS/MR, improved s/p TAVR.  Euvolemic. Has Lasix PRN in case of weight gain or edema but has not had to use it.  Disposition: F/u with Dr. [name] in 6 months.  Medication Adjustments/Labs and Tests Ordered: Current medicines are reviewed at length with the patient today.  Concerns regarding medicines are outlined above. Medication changes, Labs and Tests ordered today are summarized above and listed in the Patient Instructions accessible in Encounters.   Signed, Charlie Pitter, PA-C  07/09/2017 2:27 PM    [facility] [address], [place], [place]  [address] Phone: [phone]; Fax: [phone]

## 2017-07-09 NOTE — Telephone Encounter (Signed)
Pharmacy is requesting clarification on lisinopril Rx that was sent in. Please address.

## 2017-07-09 NOTE — Patient Instructions (Addendum)
Medication Instructions:  Your physician has recommended you make the following change in your medication: 1.  STOP Lisinopril-HCTZ 2.  START Lisinopril 20 mg daily   Labwork: TODAY:  BMET  Testing/Procedures: None ordered  Follow-Up: Your physician wants you to follow-up in:6 MONTHS WITH DR. [name]  You will receive a reminder letter in the mail two months in advance. If you don't receive a letter, please call our office to schedule the follow-up appointment.   Any Other Special Instructions Will Be Listed Below (If Applicable).     If you need a refill on your cardiac medications before your next appointment, please call your pharmacy.      For patients with congestive heart failure, we give them these special instructions:  1. Follow a low-salt diet - you are allowed no more than 2,000mg  of sodium per day. Watch your fluid intake. In general, you should not be taking in more than 2 liters of fluid per day (no more than 64oz per day). This includes sources of water in foods like soup, coffee, tea, milk, etc. 2. Weigh yourself on the same scale at same time of day and keep a log. 3. Call your doctor: (Anytime you feel any of the following symptoms)  - 3lb weight gain overnight or 5lb within a few days - you have an as-needed medicine on your list called Lasix/furosemide if this happens - Shortness of breath, with or without a dry hacking cough  - Swelling in the hands, feet or stomach  - If you have to sleep on extra pillows at night in order to breathe  IT IS IMPORTANT TO LET YOUR DOCTOR KNOW EARLY ON IF YOU ARE HAVING SYMPTOMS SO WE CAN HELP YOU!

## 2017-07-10 LAB — BASIC METABOLIC PANEL
BUN/Creatinine Ratio: 26 (ref 12–28)
BUN: 33 mg/dL (ref 10–36)
CO2: 22 mmol/L (ref 20–29)
Calcium: 8.6 mg/dL — ABNORMAL LOW (ref 8.7–10.3)
Chloride: 103 mmol/L (ref 96–106)
Creatinine, Ser: 1.29 mg/dL — ABNORMAL HIGH (ref 0.57–1.00)
GFR calc Af Amer: 41 mL/min/{1.73_m2} — ABNORMAL LOW (ref >59)
GFR calc non Af Amer: 36 mL/min/{1.73_m2} — ABNORMAL LOW (ref >59)
Glucose: 224 mg/dL — ABNORMAL HIGH (ref 65–99)
Potassium: 4.8 mmol/L (ref 3.5–5.2)
Sodium: 141 mmol/L (ref 134–144)

## 2017-09-18 DIAGNOSIS — E0520 Thyrotoxicosis with toxic multinodular goiter without thyrotoxic crisis or storm: Secondary | ICD-10-CM | POA: Diagnosis not present

## 2017-09-18 DIAGNOSIS — E0590 Thyrotoxicosis, unspecified without thyrotoxic crisis or storm: Secondary | ICD-10-CM | POA: Diagnosis not present

## 2017-09-18 DIAGNOSIS — Z5181 Encounter for therapeutic drug level monitoring: Secondary | ICD-10-CM | POA: Diagnosis not present

## 2017-09-18 DIAGNOSIS — N183 Chronic kidney disease, stage 3 (moderate): Secondary | ICD-10-CM | POA: Diagnosis not present

## 2017-11-17 ENCOUNTER — Encounter: Payer: Self-pay | Admitting: *Deleted

## 2017-11-20 ENCOUNTER — Encounter: Payer: Medicare Other | Admitting: *Deleted

## 2017-11-20 VITALS — BP 177/50 | HR 78 | Temp 98.6°F | Resp 14 | Wt 148.0 lb

## 2017-11-20 DIAGNOSIS — Z006 Encounter for examination for normal comparison and control in clinical research program: Secondary | ICD-10-CM

## 2017-11-20 NOTE — Progress Notes (Signed)
24 month follow up for ENVISAGE  Pt here today and doing well. No complaints of chest pain or shortness of breath.  Pt hadn't taken her meds today so her BP was a little high, she was going home to take them.   Pt re-consented to version 3.0.   Returned medication and count: Bottles/Kit # 1. 1070607 = 14 2. 1029597 = 60 3. 1030914 = empty 4. 1022251 = empty  Date of last dose: 11/19/17 Date of last visit: 05/29/17  Number of days between visits: 175 Number of missed doses/days: none per patient  # of pills dispensed (   240   - 74  # of pills returned)  166 ______________________________________  = _____ %    =  95 % # of days between visits x 1 (pills per day)                 175   Current Outpatient Medications:  .  atorvastatin (LIPITOR) 10 MG tablet, Take 10 mg daily by mouth., Disp: , Rfl:  .  Calcium Carb-Cholecalciferol (CALCIUM 600/VITAMIN D3 PO), Take 1 tablet by mouth 2 (two) times daily., Disp: , Rfl:  .  edoxaban (SAVAYSA) 30 MG TABS tablet, Take 30 mg by mouth daily., Disp: , Rfl:  .  furosemide (LASIX) 20 MG tablet, Take 20mg daily as needed for edema or weigh gain (3 lbs in 24 hours or 5 lbs in 1 week), Disp: 30 tablet, Rfl:  .  lisinopril (PRINIVIL,ZESTRIL) 20 MG tablet, Take 1 tablet (20 mg total) by mouth daily., Disp: 90 tablet, Rfl: 3 .  methimazole (TAPAZOLE) 10 MG tablet, Take 5 mg by mouth daily. Take 1/2 tablet by mouth once daily, Disp: , Rfl:  .  Multiple Vitamins-Minerals (OCUVITE PRESERVISION PO), Take 1 capsule by mouth daily. , Disp: , Rfl:  .  RESTASIS MULTIDOSE 0.05 % ophthalmic emulsion, Apply 1 drop to eye daily., Disp: , Rfl: 0 .  clindamycin (CLEOCIN) 300 MG capsule, Take 2 capsules by mouth one hour prior to dental appointment (Patient not taking: Reported on 11/20/2017), Disp: 4 capsule, Rfl: 2  

## 2017-11-24 NOTE — Progress Notes (Addendum)
Labs for 24 month visit:  FROM: [facility] TO: [id number] Leslye [facility] Page 1 of 2  Specimen ID: [id number] Control ID:  Alesia Richards Physician Details DOB: [birth year] Age(y/m/d): [birth year] Gender: F SSN: Patient ID: Ordered Items  Date collected: 11/20/2017 1345 L[place] I Date received: 11/20/2017 Date entered: 11/20/2017 Date reported: 11/21/2017 0536 ET    CBC With Differential/Platelet; Comp. Metabolic Panel (14)  CBC With Differential/Platelet  WBC 7.8  xl0E3/uL 3.4 - 10.8 01  RBC 3.94  xl0E6/uL 3.77 - 5.28 01  Hemoglobin 12.1  g/dL 11.1 - 15.9 01  Hematocrit 34.8  % 34.0 - 46.6 01  MCV 88  fL 79 - 97 01  MCH 30.7  pg 26.6 - 33.0 01  MCHC 34.8  g/dL 31.5 - 35.7 01  ROW 13.6  % 12.3 - 15.4 01  Platelets 238  xl0E3/uL 150 - 379 01  Neutrophils 76  % Not Estab. 01  Lymphs 14  % Not Estab. 01  Monocytes 8  % Not Estab. 01  Eos 1  % Not Estab. 01  Basos 0   0 Not Estab. 01  Neutrophils (Absolute) 5.9  xl0E3/uL 1.4 - 7.0 01  Lymphs (Absolute) 1.1  xl0E3/uL 0.7 - 3.1 01  Monocytes(Absolute) 0.6  xl0E3/uL 0.1 - 0.9 01  Eos (Absolute) 0.1  xl0E3/uL 0.0 - 0.4 01  Basa (Absolute) 0.0  xl0E3/uL 0.0 - 0.2 01  Immature Granulocytes 1  % Not Estab. 01  Immature Grans (Abs) 0.0  xl0E3/uL 0.0 - 0.1 01  Comp. Metabolic Panel (14)        Glucose 161 High mg/dL 65 - 99 01  BUN 25  mg/dL 10 - 36 01  Creatinine 1. 62 High mg/dL 0.57 - 1.00 01  eGFR If NonAfricn Am 27 Low mL/min/1.73  >59   eGFR If Africn Arn 31 Low mL/min/1.73  >59   BUN/Creatinine Ratio 15   12 - 28   Sodium 138  mmol/L 134 - 144 01  Potassium 4.8  mmol/L 3.5 - 5.2 01  Chloride 102  mmol/L 96 - 106 01  Date Issued: 30/07/62 2633 ET DUPLICATE FINAL REPORT   11/24/2017 8:50:37 AM Patient: Alesia Richards FROM: [id number] TO: [id number] [facility] Page 2 of 2  Specimen ID: [id number] DOB: [birth year] Patient ID: Control ID: Date  collected: 11/20/2017 1345 Local    Carbon Dioxide, Total 24 rnrnol/L 20 - 29 01  Calcium 8.8 mg/dL 8.7 - 10.3 01  Protein, Total 6.8 g/dL 6.0 - 8.5 01  Albumin 3.9 g/dL 3.2 - 4.6 01  Globulin, Total 2.9 g/dL 1.5 - 4.5   A/G Ratio 1.3  1. 2 - 2.2   Bilirubin, Total 0.4 rng/dL 0.0 - 1.2 01  Alkaline Phosphatase 78 IU/L 39 - 117 01  AST (SGOT) 17 IU/L 0 - 40 01  ALT (SGPT) 11 IU/L 0 - 32 01   Not Clinically Significant thx  [name] 11/27/2017 3:31 PM

## 2017-12-18 DIAGNOSIS — H353131 Nonexudative age-related macular degeneration, bilateral, early dry stage: Secondary | ICD-10-CM | POA: Diagnosis not present

## 2017-12-18 DIAGNOSIS — H04123 Dry eye syndrome of bilateral lacrimal glands: Secondary | ICD-10-CM | POA: Diagnosis not present

## 2017-12-18 DIAGNOSIS — H524 Presbyopia: Secondary | ICD-10-CM | POA: Diagnosis not present

## 2017-12-18 DIAGNOSIS — H40012 Open angle with borderline findings, low risk, left eye: Secondary | ICD-10-CM | POA: Diagnosis not present

## 2017-12-18 DIAGNOSIS — H02831 Dermatochalasis of right upper eyelid: Secondary | ICD-10-CM | POA: Diagnosis not present

## 2017-12-18 DIAGNOSIS — H16223 Keratoconjunctivitis sicca, not specified as Sjogren's, bilateral: Secondary | ICD-10-CM | POA: Diagnosis not present

## 2017-12-18 DIAGNOSIS — H52223 Regular astigmatism, bilateral: Secondary | ICD-10-CM | POA: Diagnosis not present

## 2018-03-19 DIAGNOSIS — E0590 Thyrotoxicosis, unspecified without thyrotoxic crisis or storm: Secondary | ICD-10-CM | POA: Diagnosis not present

## 2018-03-19 DIAGNOSIS — Z79899 Other long term (current) drug therapy: Secondary | ICD-10-CM | POA: Diagnosis not present

## 2018-03-19 DIAGNOSIS — E0520 Thyrotoxicosis with toxic multinodular goiter without thyrotoxic crisis or storm: Secondary | ICD-10-CM | POA: Diagnosis not present

## 2018-05-28 DIAGNOSIS — N183 Chronic kidney disease, stage 3 (moderate): Secondary | ICD-10-CM | POA: Diagnosis not present

## 2018-05-28 DIAGNOSIS — Z23 Encounter for immunization: Secondary | ICD-10-CM | POA: Diagnosis not present

## 2018-05-28 DIAGNOSIS — E1122 Type 2 diabetes mellitus with diabetic chronic kidney disease: Secondary | ICD-10-CM | POA: Diagnosis not present

## 2018-05-28 DIAGNOSIS — G3184 Mild cognitive impairment, so stated: Secondary | ICD-10-CM | POA: Diagnosis not present

## 2018-05-28 DIAGNOSIS — Z79899 Other long term (current) drug therapy: Secondary | ICD-10-CM | POA: Diagnosis not present

## 2018-05-28 DIAGNOSIS — E0520 Thyrotoxicosis with toxic multinodular goiter without thyrotoxic crisis or storm: Secondary | ICD-10-CM | POA: Diagnosis not present

## 2018-05-28 DIAGNOSIS — I4819 Other persistent atrial fibrillation: Secondary | ICD-10-CM | POA: Diagnosis not present

## 2018-05-28 DIAGNOSIS — I129 Hypertensive chronic kidney disease with stage 1 through stage 4 chronic kidney disease, or unspecified chronic kidney disease: Secondary | ICD-10-CM | POA: Diagnosis not present

## 2018-05-28 DIAGNOSIS — Z0000 Encounter for general adult medical examination without abnormal findings: Secondary | ICD-10-CM | POA: Diagnosis not present

## 2018-05-28 DIAGNOSIS — E559 Vitamin D deficiency, unspecified: Secondary | ICD-10-CM | POA: Diagnosis not present

## 2018-05-28 DIAGNOSIS — I5032 Chronic diastolic (congestive) heart failure: Secondary | ICD-10-CM | POA: Diagnosis not present

## 2018-05-28 DIAGNOSIS — I639 Cerebral infarction, unspecified: Secondary | ICD-10-CM | POA: Diagnosis not present

## 2018-06-11 ENCOUNTER — Encounter: Payer: Medicare Other | Admitting: *Deleted

## 2018-06-11 VITALS — BP 154/51 | HR 62 | Resp 16 | Wt 132.4 lb

## 2018-06-11 DIAGNOSIS — Z006 Encounter for examination for normal comparison and control in clinical research program: Secondary | ICD-10-CM

## 2018-06-11 NOTE — Research (Addendum)
ENVISAGE visit 31M Pt doing well, no complaints of cp or sob. Granddaughter brought her today and is now doing her meds. No other med changes. CMP and CBC drawn today.  NO adverse events or hospitalizations.   Products allocated today  [id number], [id number], [id number], [id number]   Returned medication and count: Bottles/Kit # 1. [id number] = 0 2. [id number] = 0 3. [id number] = 0  4. [id number] = 47  Date of last dose: 06/11/18 Date of last visit: 11/20/17  Number of days between visits:  Number of missed doses/days:  # of pills dispensed ( 240    - 47 of pills returned)      193 ______________________________________  = ______   94% # of days between visits x 1 (pills per day)                 204   Current Outpatient Medications:  .  Calcium Carb-Cholecalciferol (CALCIUM 600/VITAMIN D3 PO), Take 1 tablet by mouth 2 (two) times daily., Disp: , Rfl:  .  edoxaban (SAVAYSA) 30 MG TABS tablet, Take 30 mg by mouth daily., Disp: , Rfl:  .  lisinopril (PRINIVIL,ZESTRIL) 20 MG tablet, Take 1 tablet (20 mg total) by mouth daily., Disp: 90 tablet, Rfl: 3 .  methimazole (TAPAZOLE) 10 MG tablet, Take 5 mg by mouth daily. Take 1/2 tablet by mouth once daily, Disp: , Rfl:  .  Multiple Vitamins-Minerals (OCUVITE PRESERVISION PO), Take 1 capsule by mouth daily. , Disp: , Rfl:  .  RESTASIS MULTIDOSE 0.05 % ophthalmic emulsion, Apply 1 drop to eye daily., Disp: , Rfl: 0 .  atorvastatin (LIPITOR) 10 MG tablet, Take 10 mg daily by mouth., Disp: , Rfl:  .  clindamycin (CLEOCIN) 300 MG capsule, Take 2 capsules by mouth one hour prior to dental appointment (Patient not taking: Reported on 11/20/2017), Disp: 4 capsule, Rfl: 2 .  furosemide (LASIX) 20 MG tablet, Take '20mg'$  daily as needed for edema or weigh gain (3 lbs in 24 hours or 5 lbs in 1 week), Disp: 30 tablet, Rfl:

## 2018-06-12 LAB — COMPREHENSIVE METABOLIC PANEL
ALT: 5 IU/L (ref 0–32)
AST: 11 IU/L (ref 0–40)
Albumin/Globulin Ratio: 1.7 (ref 1.2–2.2)
Albumin: 4 g/dL (ref 3.2–4.6)
Alkaline Phosphatase: 72 IU/L (ref 39–117)
BUN/Creatinine Ratio: 18 (ref 12–28)
BUN: 28 mg/dL (ref 10–36)
Bilirubin Total: 0.6 mg/dL (ref 0.0–1.2)
CO2: 22 mmol/L (ref 20–29)
Calcium: 8.6 mg/dL — ABNORMAL LOW (ref 8.7–10.3)
Chloride: 107 mmol/L — ABNORMAL HIGH (ref 96–106)
Creatinine, Ser: 1.57 mg/dL — ABNORMAL HIGH (ref 0.57–1.00)
GFR calc Af Amer: 32 mL/min/{1.73_m2} — ABNORMAL LOW (ref >59)
GFR calc non Af Amer: 28 mL/min/{1.73_m2} — ABNORMAL LOW (ref >59)
Globulin, Total: 2.4 g/dL (ref 1.5–4.5)
Glucose: 146 mg/dL — ABNORMAL HIGH (ref 65–99)
Potassium: 5 mmol/L (ref 3.5–5.2)
Sodium: 145 mmol/L — ABNORMAL HIGH (ref 134–144)
Total Protein: 6.4 g/dL (ref 6.0–8.5)

## 2018-06-12 LAB — CBC WITH DIFFERENTIAL/PLATELET
Basophils Absolute: 0 10*3/uL (ref 0.0–0.2)
Basos: 1 %
EOS (ABSOLUTE): 0.1 10*3/uL (ref 0.0–0.4)
Eos: 1 %
Hematocrit: 37.1 % (ref 34.0–46.6)
Hemoglobin: 12.4 g/dL (ref 11.1–15.9)
Immature Grans (Abs): 0 10*3/uL (ref 0.0–0.1)
Immature Granulocytes: 0 %
Lymphocytes Absolute: 0.9 10*3/uL (ref 0.7–3.1)
Lymphs: 11 %
MCH: 30.7 pg (ref 26.6–33.0)
MCHC: 33.4 g/dL (ref 31.5–35.7)
MCV: 92 fL (ref 79–97)
Monocytes Absolute: 0.7 10*3/uL (ref 0.1–0.9)
Monocytes: 9 %
Neutrophils Absolute: 6.2 10*3/uL (ref 1.4–7.0)
Neutrophils: 78 %
Platelets: 248 10*3/uL (ref 150–450)
RBC: 4.04 x10E6/uL (ref 3.77–5.28)
RDW: 12.7 % (ref 12.3–15.4)
WBC: 8 10*3/uL (ref 3.4–10.8)

## 2018-06-15 DIAGNOSIS — Z822 Family history of deafness and hearing loss: Secondary | ICD-10-CM | POA: Diagnosis not present

## 2018-06-15 DIAGNOSIS — H9313 Tinnitus, bilateral: Secondary | ICD-10-CM | POA: Diagnosis not present

## 2018-06-15 DIAGNOSIS — H903 Sensorineural hearing loss, bilateral: Secondary | ICD-10-CM | POA: Diagnosis not present

## 2018-06-21 ENCOUNTER — Telehealth: Payer: Self-pay

## 2018-06-21 DIAGNOSIS — I10 Essential (primary) hypertension: Secondary | ICD-10-CM

## 2018-06-21 MED ORDER — LISINOPRIL 10 MG PO TABS: 10.0000 mg | ORAL_TABLET | Freq: Every day | ORAL | 3 refills | Status: DC

## 2018-06-21 NOTE — Addendum Note (Signed)
Addended by: Philemon Kingdom D on: 06/21/2018 04:14 PM   Modules accepted: Orders

## 2018-06-21 NOTE — Telephone Encounter (Signed)
Notes recorded by Patton Salles, RN on 06/21/2018 at 4:05 PM EST Patient called. Patient aware. Spoke to pt grand daughter who takes care of [name] medication. She will decrease lisinopril to 10 mg daily. Recheck labs on 07/23/18 @ 3 pm.     Lab appointment scheduled.

## 2018-06-21 NOTE — Telephone Encounter (Signed)
-----   Message from [name], MD sent at 06/18/2018  9:00 PM EST ----- Creatinine elevated compared to past readings. Recommend decrease lisinopril to 10 mg and push fluids. Repeat labs one month. thx

## 2018-07-09 DIAGNOSIS — H40012 Open angle with borderline findings, low risk, left eye: Secondary | ICD-10-CM | POA: Diagnosis not present

## 2018-07-09 DIAGNOSIS — H02831 Dermatochalasis of right upper eyelid: Secondary | ICD-10-CM | POA: Diagnosis not present

## 2018-07-09 DIAGNOSIS — H04123 Dry eye syndrome of bilateral lacrimal glands: Secondary | ICD-10-CM | POA: Diagnosis not present

## 2018-07-09 DIAGNOSIS — H16223 Keratoconjunctivitis sicca, not specified as Sjogren's, bilateral: Secondary | ICD-10-CM | POA: Diagnosis not present

## 2018-07-23 ENCOUNTER — Other Ambulatory Visit: Payer: Medicare Other

## 2018-07-23 DIAGNOSIS — I10 Essential (primary) hypertension: Secondary | ICD-10-CM

## 2018-07-24 LAB — BASIC METABOLIC PANEL
BUN/Creatinine Ratio: 17 (ref 12–28)
BUN: 24 mg/dL (ref 10–36)
CO2: 24 mmol/L (ref 20–29)
Calcium: 8.7 mg/dL (ref 8.7–10.3)
Chloride: 103 mmol/L (ref 96–106)
Creatinine, Ser: 1.45 mg/dL — ABNORMAL HIGH (ref 0.57–1.00)
GFR calc Af Amer: 35 mL/min/{1.73_m2} — ABNORMAL LOW (ref >59)
GFR calc non Af Amer: 31 mL/min/{1.73_m2} — ABNORMAL LOW (ref >59)
Glucose: 170 mg/dL — ABNORMAL HIGH (ref 65–99)
Potassium: 5.1 mmol/L (ref 3.5–5.2)
Sodium: 142 mmol/L (ref 134–144)

## 2018-08-01 ENCOUNTER — Other Ambulatory Visit: Payer: Self-pay | Admitting: Physician Assistant

## 2018-08-05 ENCOUNTER — Other Ambulatory Visit: Payer: Self-pay | Admitting: Physician Assistant

## 2018-10-22 ENCOUNTER — Inpatient Hospital Stay (HOSPITAL_COMMUNITY)
Admission: EM | Admit: 2018-10-22 | Discharge: 2018-10-26 | DRG: 418 | Disposition: A | Discharge status: Home Health | Payer: Medicare Other | Attending: Internal Medicine | Admitting: Internal Medicine

## 2018-10-22 ENCOUNTER — Emergency Department (HOSPITAL_COMMUNITY): Payer: Medicare Other

## 2018-10-22 ENCOUNTER — Other Ambulatory Visit: Payer: Self-pay

## 2018-10-22 ENCOUNTER — Encounter (HOSPITAL_COMMUNITY): Payer: Self-pay | Admitting: *Deleted

## 2018-10-22 DIAGNOSIS — K8065 Calculus of gallbladder and bile duct with chronic cholecystitis with obstruction: Secondary | ICD-10-CM | POA: Diagnosis not present

## 2018-10-22 DIAGNOSIS — K8051 Calculus of bile duct without cholangitis or cholecystitis with obstruction: Secondary | ICD-10-CM

## 2018-10-22 DIAGNOSIS — E890 Postprocedural hypothyroidism: Secondary | ICD-10-CM | POA: Diagnosis present

## 2018-10-22 DIAGNOSIS — I130 Hypertensive heart and chronic kidney disease with heart failure and stage 1 through stage 4 chronic kidney disease, or unspecified chronic kidney disease: Secondary | ICD-10-CM | POA: Diagnosis present

## 2018-10-22 DIAGNOSIS — I5032 Chronic diastolic (congestive) heart failure: Secondary | ICD-10-CM | POA: Diagnosis present

## 2018-10-22 DIAGNOSIS — Z953 Presence of xenogenic heart valve: Secondary | ICD-10-CM

## 2018-10-22 DIAGNOSIS — Z7982 Long term (current) use of aspirin: Secondary | ICD-10-CM

## 2018-10-22 DIAGNOSIS — I10 Essential (primary) hypertension: Secondary | ICD-10-CM | POA: Diagnosis present

## 2018-10-22 DIAGNOSIS — N183 Chronic kidney disease, stage 3 unspecified: Secondary | ICD-10-CM | POA: Diagnosis present

## 2018-10-22 DIAGNOSIS — I482 Chronic atrial fibrillation, unspecified: Secondary | ICD-10-CM | POA: Diagnosis present

## 2018-10-22 DIAGNOSIS — Z23 Encounter for immunization: Secondary | ICD-10-CM | POA: Diagnosis not present

## 2018-10-22 DIAGNOSIS — M810 Age-related osteoporosis without current pathological fracture: Secondary | ICD-10-CM | POA: Diagnosis present

## 2018-10-22 DIAGNOSIS — E1122 Type 2 diabetes mellitus with diabetic chronic kidney disease: Secondary | ICD-10-CM | POA: Diagnosis present

## 2018-10-22 DIAGNOSIS — Z952 Presence of prosthetic heart valve: Secondary | ICD-10-CM

## 2018-10-22 DIAGNOSIS — Z833 Family history of diabetes mellitus: Secondary | ICD-10-CM

## 2018-10-22 DIAGNOSIS — I2510 Atherosclerotic heart disease of native coronary artery without angina pectoris: Secondary | ICD-10-CM | POA: Diagnosis present

## 2018-10-22 DIAGNOSIS — I480 Paroxysmal atrial fibrillation: Secondary | ICD-10-CM | POA: Diagnosis present

## 2018-10-22 DIAGNOSIS — H9190 Unspecified hearing loss, unspecified ear: Secondary | ICD-10-CM | POA: Diagnosis present

## 2018-10-22 DIAGNOSIS — Z7901 Long term (current) use of anticoagulants: Secondary | ICD-10-CM

## 2018-10-22 DIAGNOSIS — Z8249 Family history of ischemic heart disease and other diseases of the circulatory system: Secondary | ICD-10-CM

## 2018-10-22 DIAGNOSIS — K5790 Diverticulosis of intestine, part unspecified, without perforation or abscess without bleeding: Secondary | ICD-10-CM | POA: Diagnosis not present

## 2018-10-22 DIAGNOSIS — R1110 Vomiting, unspecified: Secondary | ICD-10-CM | POA: Diagnosis not present

## 2018-10-22 DIAGNOSIS — I447 Left bundle-branch block, unspecified: Secondary | ICD-10-CM | POA: Diagnosis not present

## 2018-10-22 DIAGNOSIS — J849 Interstitial pulmonary disease, unspecified: Secondary | ICD-10-CM | POA: Diagnosis present

## 2018-10-22 DIAGNOSIS — K8031 Calculus of bile duct with cholangitis, unspecified, with obstruction: Secondary | ICD-10-CM

## 2018-10-22 DIAGNOSIS — Z79899 Other long term (current) drug therapy: Secondary | ICD-10-CM

## 2018-10-22 DIAGNOSIS — K831 Obstruction of bile duct: Secondary | ICD-10-CM | POA: Diagnosis not present

## 2018-10-22 DIAGNOSIS — Z8673 Personal history of transient ischemic attack (TIA), and cerebral infarction without residual deficits: Secondary | ICD-10-CM

## 2018-10-22 DIAGNOSIS — K219 Gastro-esophageal reflux disease without esophagitis: Secondary | ICD-10-CM | POA: Diagnosis present

## 2018-10-22 DIAGNOSIS — E559 Vitamin D deficiency, unspecified: Secondary | ICD-10-CM | POA: Diagnosis present

## 2018-10-22 LAB — I-STAT TROPONIN, ED: Troponin i, poc: 0.02 ng/mL (ref 0.00–0.08)

## 2018-10-22 LAB — CBC
HCT: 43.9 % (ref 36.0–46.0)
Hemoglobin: 14.3 g/dL (ref 12.0–15.0)
MCH: 30.8 pg (ref 26.0–34.0)
MCHC: 32.6 g/dL (ref 30.0–36.0)
MCV: 94.6 fL (ref 80.0–100.0)
Platelets: 233 10*3/uL (ref 150–400)
RBC: 4.64 MIL/uL (ref 3.87–5.11)
RDW: 14.6 % (ref 11.5–15.5)
WBC: 17.4 10*3/uL — ABNORMAL HIGH (ref 4.0–10.5)
nRBC: 0 % (ref 0.0–0.2)

## 2018-10-22 LAB — COMPREHENSIVE METABOLIC PANEL
ALT: 185 U/L — ABNORMAL HIGH (ref 0–44)
AST: 366 U/L — ABNORMAL HIGH (ref 15–41)
Albumin: 4.3 g/dL (ref 3.5–5.0)
Alkaline Phosphatase: 124 U/L (ref 38–126)
Anion gap: 13 (ref 5–15)
BUN: 27 mg/dL — ABNORMAL HIGH (ref 8–23)
CO2: 23 mmol/L (ref 22–32)
Calcium: 9.1 mg/dL (ref 8.9–10.3)
Chloride: 102 mmol/L (ref 98–111)
Creatinine, Ser: 1.42 mg/dL — ABNORMAL HIGH (ref 0.44–1.00)
GFR calc Af Amer: 36 mL/min — ABNORMAL LOW (ref >60)
GFR calc non Af Amer: 31 mL/min — ABNORMAL LOW (ref >60)
Glucose, Bld: 209 mg/dL — ABNORMAL HIGH (ref 70–99)
Potassium: 4.2 mmol/L (ref 3.5–5.1)
Sodium: 138 mmol/L (ref 135–145)
Total Bilirubin: 2.6 mg/dL — ABNORMAL HIGH (ref 0.3–1.2)
Total Protein: 7.4 g/dL (ref 6.5–8.1)

## 2018-10-22 LAB — LIPASE, BLOOD: Lipase: 30 U/L (ref 11–51)

## 2018-10-22 IMAGING — CT CT ABDOMEN AND PELVIS WITHOUT CONTRAST
2 of 4 series · 16 of 46 positions shown, 18 images · non-contrast
Comparison: Body CT [DATE]

CLINICAL DATA: Nausea and loss of appetite.

EXAM:
CT ABDOMEN AND PELVIS WITHOUT CONTRAST
TECHNIQUE: Multidetector CT imaging of the abdomen and pelvis was performed
following the standard protocol without IV contrast.

[Series 3: axial st · axial · 0.79mm/px · z∈[+1040,+1446]mm · 13 of 91 slices shown, 15 images]
[im 5/91  soft-tissue]
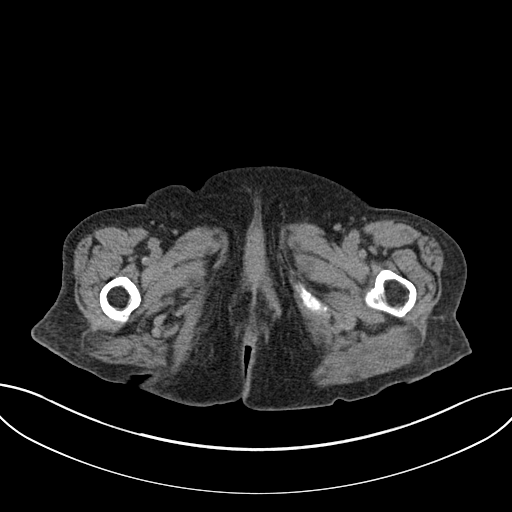
[im 5/91  bone]
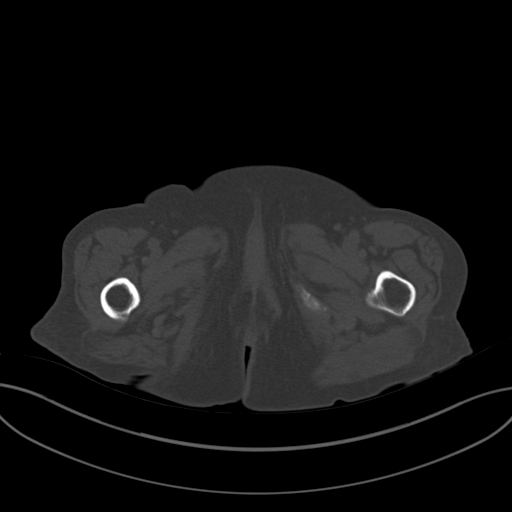
[im 15/91  soft-tissue]
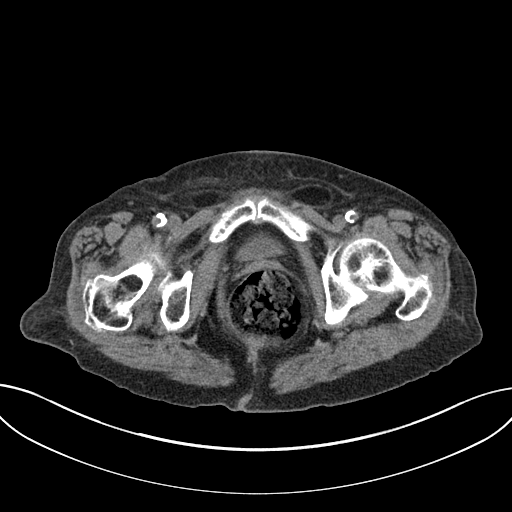
[im 19/91  soft-tissue]
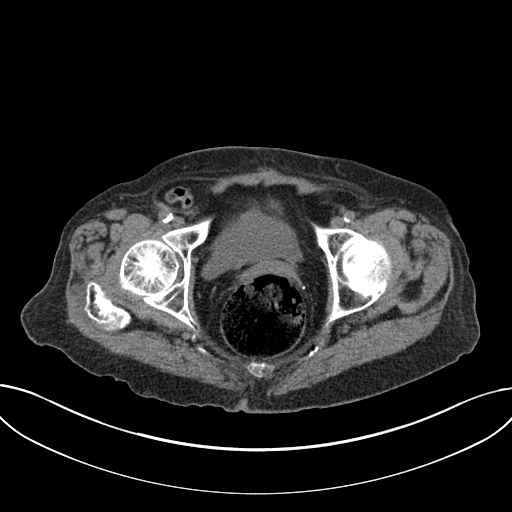
[im 24/91  soft-tissue]
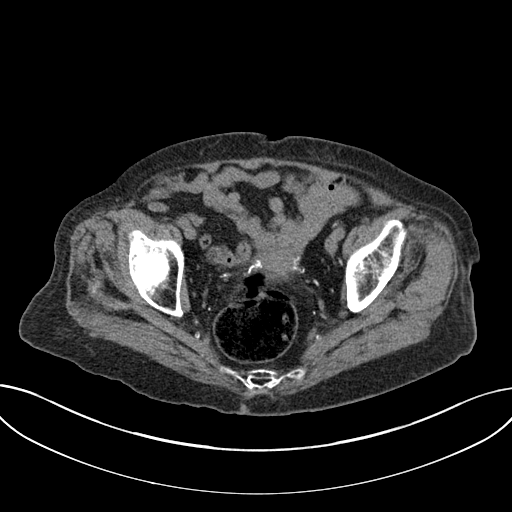
[im 34/91  soft-tissue]
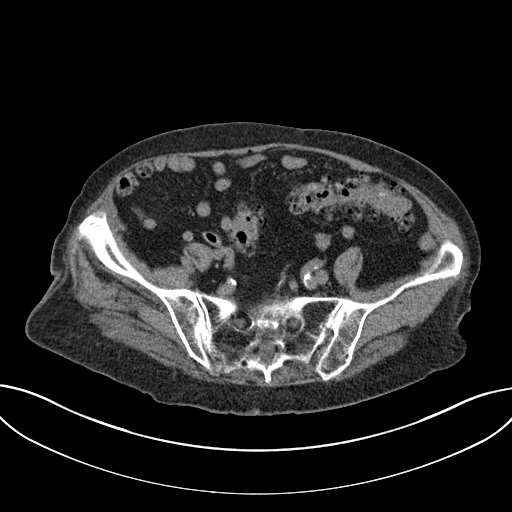
[im 38/91  soft-tissue]
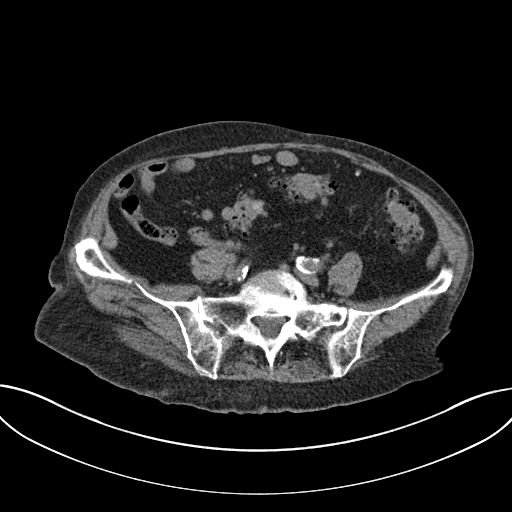
[im 48/91  soft-tissue]
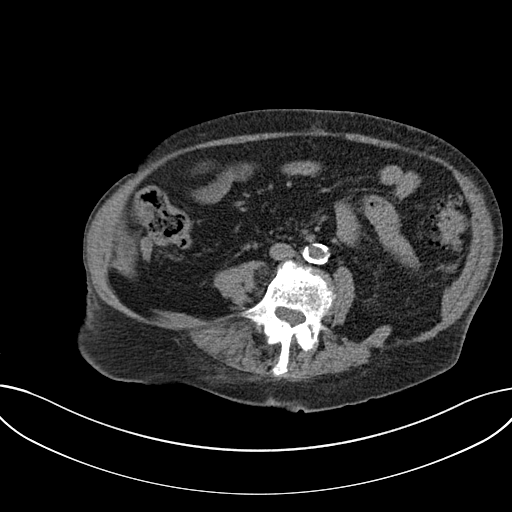
[im 53/91  soft-tissue]
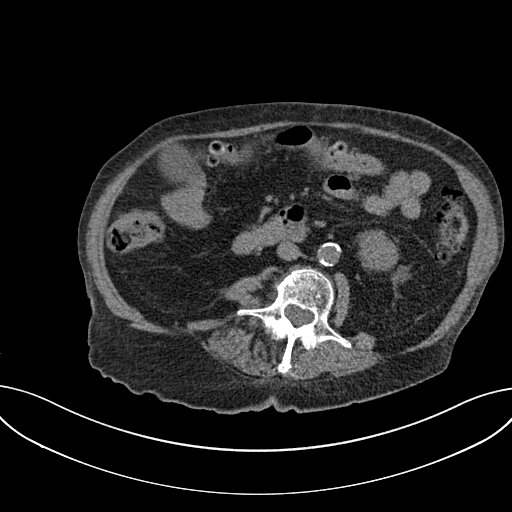
[im 57/91  soft-tissue]
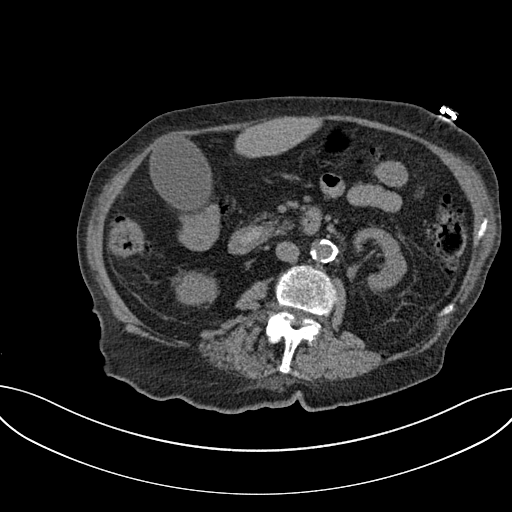
[im 57/91  bone]
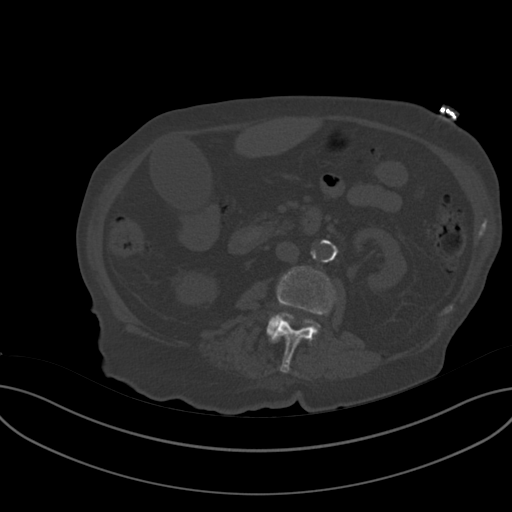
[im 67/91  soft-tissue]
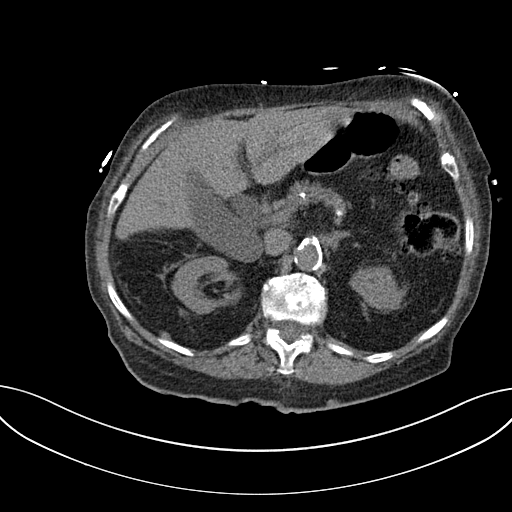
[im 72/91  soft-tissue]
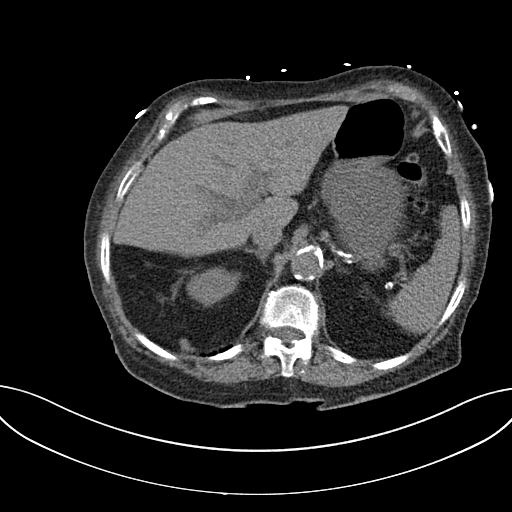
[im 76/91  soft-tissue]
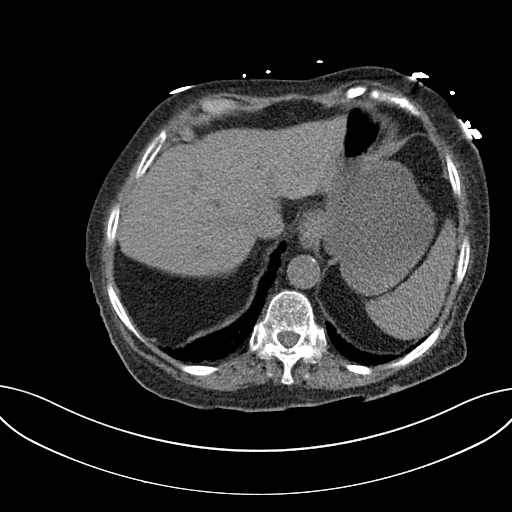
[im 86/91  soft-tissue]
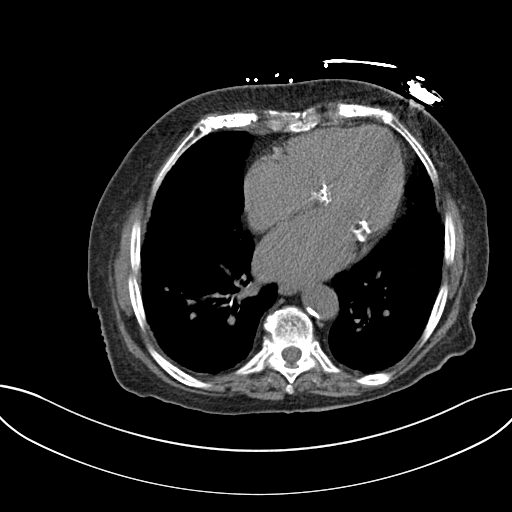

[Series 6: coronal st · coronal · 0.77mm/px · 3 of 143 slices shown]
[im 48/143  soft-tissue]
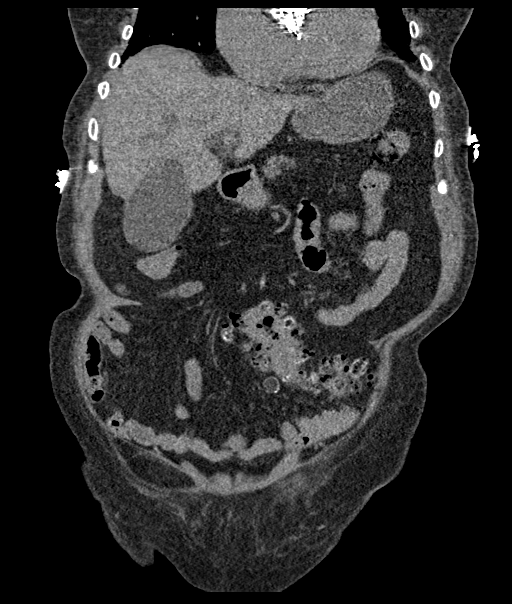
[im 64/143  soft-tissue]
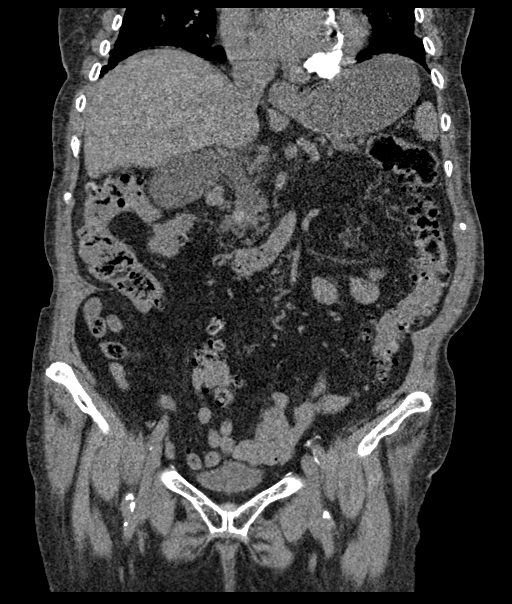
[im 79/143  soft-tissue]
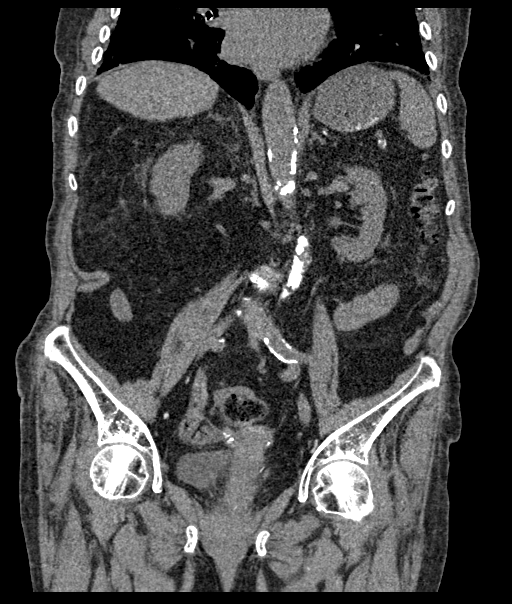

[16 of 46 positions shown; findings below may reference images not displayed]

FINDINGS: Lower chest: Chronic interstitial lung changes. Enlarged of the
cardiac silhouette. Heavy calcifications of the mitral valve
annulus. Artificial aortic valve noted.

Hepatobiliary: Is normal appearance of the liver. In the gallbladder
as dilated. There is dilation of the extrahepatic common bile duct
as well. High density material within the distal common bile duct
may represent obstructive choledocholithiasis.

Pancreas: Unremarkable. No pancreatic ductal dilatation or
surrounding inflammatory changes.

Spleen: Normal in size without focal abnormality.

Adrenals/Urinary Tract: Adrenal glands are unremarkable. Bilateral
renal cortical thinning and perinephric fat stranding, nonspecific.
Kidneys are without renal calculi, focal lesion, or hydronephrosis.
Bladder is unremarkable.

Stomach/Bowel: Stomach is within normal limits. No evidence of bowel
wall thickening, distention, or inflammatory changes. Diffuse
colonic diverticulosis without evidence of diverticulitis.

Vascular/Lymphatic: Aortic atherosclerosis. No enlarged abdominal or
pelvic lymph nodes.

Reproductive: Uterus and bilateral adnexa are unremarkable.

Other: No abdominal wall hernia or abnormality. No abdominopelvic
ascites.

Musculoskeletal: No acute or significant osseous findings.
IMPRESSION: 1. Dilation of the gallbladder and common bile duct with high
density material within the distal common bile duct which is
suspicious for obstructive choledocholithiasis.
2. Diffuse colonic diverticulosis without evidence of
diverticulitis.

## 2018-10-22 MED ORDER — SODIUM CHLORIDE 0.9 % IV BOLUS
500.0000 mL | Freq: Once | INTRAVENOUS | Status: AC
  Administered 2018-10-22: 500 mL via INTRAVENOUS

## 2018-10-22 MED ORDER — METRONIDAZOLE IN NACL 5-0.79 MG/ML-% IV SOLN
500.0000 mg | Freq: Once | INTRAVENOUS | Status: AC
  Administered 2018-10-22: 500 mg via INTRAVENOUS
  Filled 2018-10-22: qty 100

## 2018-10-22 MED ORDER — SODIUM CHLORIDE 0.9 % IV SOLN
2.0000 g | Freq: Once | INTRAVENOUS | Status: AC
  Administered 2018-10-23: 2 g via INTRAVENOUS
  Filled 2018-10-22: qty 2

## 2018-10-22 MED ORDER — SODIUM CHLORIDE 0.9% FLUSH
3.0000 mL | Freq: Once | INTRAVENOUS | Status: AC
  Administered 2018-10-22: 3 mL via INTRAVENOUS

## 2018-10-22 NOTE — ED Triage Notes (Addendum)
Pt stated "I haven't been feeling well all day.  I haven't eaten much today."  Pt is dry heaving in Triage, clear mucus noted.  Pt denies diarrhea/pain.

## 2018-10-22 NOTE — ED Notes (Signed)
UA COLLECT UNSUCCESSFUL. IV FLUIDS GIVEN. WILL PERFORM IN & OUT CATH WHEN BAG FINISHED.

## 2018-10-22 NOTE — ED Provider Notes (Signed)
[facility] DEPT Provider Note   CSN: [id number] Arrival date & time: 10/22/18  1935    History   Chief Complaint Chief Complaint  Patient presents with   Emesis    HPI Alexa Mcdonald is a 85 y.o. female.     The history is provided by the patient. No language interpreter was used.  Emesis   Alexa Mcdonald is a 85 y.o. female who presents to the Emergency Department complaining of malaise, nausea. She presents to the emergency department accompanied by her son for evaluation of nausea and malaise of began several days ago. She reports feeling unwell in general with fatigue and malaise. She also has associated nausea and poor oral intake. She has experienced occasional small volume emesis starting today. She lives at home alone and ambulates with a cane at baseline. She states she is able to ambulate at her baseline. She denies any fevers, chills, chest pain, shortness of breath, abdominal pain, dysuria, diarrhea, leg swelling or pain. No prior similar symptoms. No prior abdominal surgeries. No known sick contacts. Past Medical History:  Diagnosis Date   1st degree AV block    Bradycardia    Chronic diastolic heart failure ([place]) 02/07/2016   CKD (chronic kidney disease), stage III (HCC)    Colon, diverticulosis    Diabetes mellitus    diet controlled   Esophageal reflux    Essential hypertension    GERD (gastroesophageal reflux disease)    Hematochezia    HOH (hard of hearing)    wears bilateral hearing aids   Hx of orthostatic hypotension    Interstitial lung disease (HCC)    LBBB (left bundle branch block)    Long term current use of anticoagulant therapy    Mediastinal goiter     recurrent s/p resection 1988   Mild CAD    Mitral regurgitation    Osteopenia    Osteoporosis    Paroxysmal atrial fibrillation ([place])    a. CHA2DS2VASc = 5-->coumadin;  b. previously on amio->d/c'd 11/2014 secondary to concern for  possible amio lung.   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 59/56/3875   Loss of peripheral visual field 03/12/2016   Ataxia 03/12/2016   Dizziness and giddiness 03/12/2016   Cerebrovascular accident (CVA) due to embolism of posterior cerebral artery with infarctions of both occipital lobes ([place]) 03/12/2016   Chronic diastolic heart failure ([place]) 02/07/2016   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   Severe aortic stenosis    Atrial fibrillation, persistent ([place])    Mitral regurgitation    Interstitial lung disease ([place]) 10/30/2014   Hematochezia 12/13/2013   CKD (chronic kidney disease), stage III ([place]) 12/13/2013   Hyperthyroidism 12/13/2013   GERD (gastroesophageal reflux disease) 12/13/2013   Long term current use of anticoagulant therapy 12/13/2013   Elevated brain natriuretic peptide (BNP) level 12/13/2013   Sinus bradycardia 12/13/2013   Pneumonia 12/13/2013   Encounter for therapeutic drug monitoring 08/29/2013   Essential hypertension 08/05/2013   Edema extremities 08/05/2013   Bradycardia 11/27/2011  Past Surgical History:  Procedure Laterality Date   CARDIAC CATHETERIZATION N/A 10/31/2015   Procedure: Right/Left Heart Cath and Coronary Angiography;  Surgeon: [name], MD;  Location: [facility];  Service: Cardiovascular;  Laterality: N/A;   CARPAL TUNNEL RELEASE Bilateral    CATARACT EXTRACTION Bilateral    CESAREAN SECTION     x3   mediastinal removal of a goiter      MRI  08/18/07   head (diabetes heart study at [facility])   TEE WITHOUT CARDIOVERSION N/A 11/27/2015   Procedure: TRANSESOPHAGEAL ECHOCARDIOGRAM (TEE);  Surgeon: [name], MD;  Location: [facility];  Service: Open Heart Surgery;  Laterality: N/A;   TRANSCATHETER AORTIC VALVE REPLACEMENT, TRANSFEMORAL N/A 11/27/2015   Procedure: TRANSCATHETER AORTIC VALVE REPLACEMENT, TRANSFEMORAL;  Surgeon: [name], MD;  Location: [facility];  Service: Open Heart Surgery;  Laterality: N/A;     OB History   No obstetric history on file.      Home Medications    Prior to Admission medications   Medication Sig Start Date End Date Taking? Authorizing Provider  aspirin EC 81 MG tablet Take 81 mg by mouth daily.   Yes [provider]  atorvastatin (LIPITOR) 10 MG tablet Take 10 mg daily by mouth.   Yes [provider]  b complex vitamins tablet Take 1 tablet by mouth daily.   Yes [provider]  CALCIUM PO Take 1 tablet by mouth daily.   Yes [provider]  edoxaban (SAVAYSA) 30 MG TABS tablet Take 30 mg by mouth daily.   Yes [provider]  lisinopril (PRINIVIL,ZESTRIL) 10 MG tablet Take 1 tablet (10 mg total) by mouth daily. Please call our office to schedule an appt for future refills (2nd attempt) 08/02/18  Yes Dunn, Dayna N, PA-C  methimazole (TAPAZOLE) 10 MG tablet Take 5 mg by mouth daily. 09/27/18  Yes [provider]  Multiple Vitamins-Minerals (OCUVITE PRESERVISION PO) Take 1 capsule by mouth daily.    Yes [provider]  RESTASIS MULTIDOSE 0.05 % ophthalmic emulsion Apply 1 drop to eye daily. 08/13/17  Yes [provider]  VITAMIN D PO Take 1 tablet by mouth daily.   Yes [provider]  clindamycin (CLEOCIN) 300 MG capsule Take 2 capsules by mouth one hour prior to dental appointment 11/14/16   [name], MD    Family History Family History  Problem Relation Age of Onset   Diabetes Mother    Cancer - Prostate  Father    Pulmonary embolism Father    Hypertension Sister    Diabetes Sister    Hypertension Brother    Arrhythmia Brother        afib   CAD Brother    Pulmonary fibrosis Brother     Social History Social History   Tobacco Use   Smoking status: Never Smoker   Smokeless tobacco: Never Used  Substance Use Topics   Alcohol use: No    Alcohol/week: 0.0 standard drinks   Drug use: No     Allergies   Amoxicillin and Celebrex [celecoxib]   Review of Systems Review of Systems  Gastrointestinal: Positive for vomiting.  All other systems reviewed and are negative.    Physical Exam Updated Vital Signs BP (!) 167/75 (BP Location: Right Arm)    Pulse 71    Temp 98.7 F (37.1 C) (Oral)    Resp 16    Ht 5\' 2"  (1.575 m)    Wt 60 kg  LMP  (Approximate)    SpO2 98%    BMI 24.19 kg/m   Physical Exam Vitals signs and nursing note reviewed.  Constitutional:      Appearance: She is well-developed.  HENT:     Head: Normocephalic and atraumatic.  Cardiovascular:     Rate and Rhythm: Normal rate and regular rhythm.     Heart sounds: No murmur.  Pulmonary:     Effort: Pulmonary effort is normal. No respiratory distress.     Breath sounds: Normal breath sounds.  Abdominal:     Palpations: Abdomen is soft.     Tenderness: There is no abdominal tenderness. There is no guarding or rebound.  Musculoskeletal:        General: No swelling or tenderness.  Skin:    General: Skin is warm and dry.  Neurological:     Mental Status: She is alert and oriented to person, place, and time.  Psychiatric:        Mood and Affect: Mood normal.        Behavior: Behavior normal.      ED Treatments / Results  Labs (all labs ordered are listed, but only abnormal results are displayed) Labs Reviewed  COMPREHENSIVE METABOLIC PANEL - Abnormal; Notable for the following components:      Result Value   Glucose, Bld 209 (*)    BUN 27 (*)    Creatinine, Ser 1.42 (*)    AST 366 (*)      ALT 185 (*)    Total Bilirubin 2.6 (*)    GFR calc non Af Amer 31 (*)    GFR calc Af Amer 36 (*)    All other components within normal limits  CBC - Abnormal; Notable for the following components:   WBC 17.4 (*)    All other components within normal limits  LIPASE, BLOOD  URINALYSIS, ROUTINE W REFLEX MICROSCOPIC  I-STAT TROPONIN, ED    EKG EKG Interpretation  Date/Time:  Friday October 22 2018 20:53:20 EDT Ventricular Rate:  68 PR Interval:    QRS Duration: 151 QT Interval:  433 QTC Calculation: 461 R Axis:   -27 Text Interpretation:  Atrial fibrillation Left bundle branch block Confirmed by Quintella Reichert [phone]) on 10/22/2018 8:57:17 PM   Radiology Ct Abdomen Pelvis Wo Contrast  Result Date: 10/22/2018 CLINICAL DATA:  Nausea and loss of appetite. EXAM: CT ABDOMEN AND PELVIS WITHOUT CONTRAST TECHNIQUE: Multidetector CT imaging of the abdomen and pelvis was performed following the standard protocol without IV contrast. COMPARISON:  Body CT 10/24/2015 FINDINGS: Lower chest: Chronic interstitial lung changes. Enlarged of the cardiac silhouette. Heavy calcifications of the mitral valve annulus. Artificial aortic valve noted. Hepatobiliary: Is normal appearance of the liver. In the gallbladder as dilated. There is dilation of the extrahepatic common bile duct as well. High density material within the distal common bile duct may represent obstructive choledocholithiasis. Pancreas: Unremarkable. No pancreatic ductal dilatation or surrounding inflammatory changes. Spleen: Normal in size without focal abnormality. Adrenals/Urinary Tract: Adrenal glands are unremarkable. Bilateral renal cortical thinning and perinephric fat stranding, nonspecific. Kidneys are without renal calculi, focal lesion, or hydronephrosis. Bladder is unremarkable. Stomach/Bowel: Stomach is within normal limits. No evidence of bowel wall thickening, distention, or inflammatory changes. Diffuse colonic diverticulosis  without evidence of diverticulitis. Vascular/Lymphatic: Aortic atherosclerosis. No enlarged abdominal or pelvic lymph nodes. Reproductive: Uterus and bilateral adnexa are unremarkable. Other: No abdominal wall hernia or abnormality. No abdominopelvic ascites. Musculoskeletal: No acute or significant osseous findings. IMPRESSION: 1. Dilation  of the gallbladder and common bile duct with high density material within the distal common bile duct which is suspicious for obstructive choledocholithiasis. 2. Diffuse colonic diverticulosis without evidence of diverticulitis. Electronically Signed   By: Fidela Salisbury M.D.   On: 10/22/2018 22:56    Procedures Procedures (including critical care time) CRITICAL CARE Performed by: Quintella Reichert   Total critical care time: 35 minutes  Critical care time was exclusive of separately billable procedures and treating other patients.  Critical care was necessary to treat or prevent imminent or life-threatening deterioration.  Critical care was time spent personally by me on the following activities: development of treatment plan with patient and/or surrogate as well as nursing, discussions with consultants, evaluation of patient's response to treatment, examination of patient, obtaining history from patient or surrogate, ordering and performing treatments and interventions, ordering and review of laboratory studies, ordering and review of radiographic studies, pulse oximetry and re-evaluation of patient's condition.  Medications Ordered in ED Medications  ceFEPIme (MAXIPIME) 2 g in sodium chloride 0.9 % 100 mL IVPB (has no administration in time range)    And  metroNIDAZOLE (FLAGYL) IVPB 500 mg (has no administration in time range)  sodium chloride flush (NS) 0.9 % injection 3 mL (3 mLs Intravenous Given 10/22/18 2037)  sodium chloride 0.9 % bolus 500 mL (500 mLs Intravenous New Bag/Given 10/22/18 2153)     Initial Impression / Assessment and Plan / ED  Course  I have reviewed the triage vital signs and the nursing notes.  Pertinent labs & imaging results that were available during my care of the patient were reviewed by me and considered in my medical decision making (see chart for details).        Patient presents to the emergency department for evaluation of progressive malaise. She is non-toxic appearing on evaluation. Labs are significant for leukocytosis as well as elevation in her LFTs. CT abdomen pelvis obtained, which is consistent with biliary obstruction. She was treated with antibiotics. Patient updated of findings of studies and recommendation for admission and she is in agreement with treatment plan. Hospitalist consulted for admission for further treatment.  Final Clinical Impressions(s) / ED Diagnoses   Final diagnoses:  Biliary obstruction    ED Discharge Orders    None       Quintella Reichert, MD 10/22/18 2316

## 2018-10-23 ENCOUNTER — Encounter (HOSPITAL_COMMUNITY): Payer: Self-pay | Admitting: Internal Medicine

## 2018-10-23 DIAGNOSIS — E890 Postprocedural hypothyroidism: Secondary | ICD-10-CM | POA: Diagnosis present

## 2018-10-23 DIAGNOSIS — I480 Paroxysmal atrial fibrillation: Secondary | ICD-10-CM | POA: Diagnosis not present

## 2018-10-23 DIAGNOSIS — Z952 Presence of prosthetic heart valve: Secondary | ICD-10-CM | POA: Diagnosis not present

## 2018-10-23 DIAGNOSIS — J849 Interstitial pulmonary disease, unspecified: Secondary | ICD-10-CM | POA: Diagnosis not present

## 2018-10-23 DIAGNOSIS — K8051 Calculus of bile duct without cholangitis or cholecystitis with obstruction: Secondary | ICD-10-CM | POA: Diagnosis not present

## 2018-10-23 DIAGNOSIS — N183 Chronic kidney disease, stage 3 (moderate): Secondary | ICD-10-CM

## 2018-10-23 DIAGNOSIS — H9190 Unspecified hearing loss, unspecified ear: Secondary | ICD-10-CM | POA: Diagnosis present

## 2018-10-23 DIAGNOSIS — K8065 Calculus of gallbladder and bile duct with chronic cholecystitis with obstruction: Secondary | ICD-10-CM | POA: Diagnosis not present

## 2018-10-23 DIAGNOSIS — I5032 Chronic diastolic (congestive) heart failure: Secondary | ICD-10-CM | POA: Diagnosis not present

## 2018-10-23 DIAGNOSIS — K8309 Other cholangitis: Secondary | ICD-10-CM | POA: Diagnosis not present

## 2018-10-23 DIAGNOSIS — I10 Essential (primary) hypertension: Secondary | ICD-10-CM | POA: Diagnosis not present

## 2018-10-23 DIAGNOSIS — I482 Chronic atrial fibrillation, unspecified: Secondary | ICD-10-CM | POA: Diagnosis not present

## 2018-10-23 DIAGNOSIS — Z833 Family history of diabetes mellitus: Secondary | ICD-10-CM | POA: Diagnosis not present

## 2018-10-23 DIAGNOSIS — Z953 Presence of xenogenic heart valve: Secondary | ICD-10-CM | POA: Diagnosis not present

## 2018-10-23 DIAGNOSIS — Z23 Encounter for immunization: Secondary | ICD-10-CM | POA: Diagnosis not present

## 2018-10-23 DIAGNOSIS — E1122 Type 2 diabetes mellitus with diabetic chronic kidney disease: Secondary | ICD-10-CM | POA: Diagnosis present

## 2018-10-23 DIAGNOSIS — Z79899 Other long term (current) drug therapy: Secondary | ICD-10-CM | POA: Diagnosis not present

## 2018-10-23 DIAGNOSIS — R1110 Vomiting, unspecified: Secondary | ICD-10-CM | POA: Diagnosis not present

## 2018-10-23 DIAGNOSIS — I2510 Atherosclerotic heart disease of native coronary artery without angina pectoris: Secondary | ICD-10-CM | POA: Diagnosis not present

## 2018-10-23 DIAGNOSIS — M810 Age-related osteoporosis without current pathological fracture: Secondary | ICD-10-CM | POA: Diagnosis present

## 2018-10-23 DIAGNOSIS — I130 Hypertensive heart and chronic kidney disease with heart failure and stage 1 through stage 4 chronic kidney disease, or unspecified chronic kidney disease: Secondary | ICD-10-CM | POA: Diagnosis not present

## 2018-10-23 DIAGNOSIS — Z8249 Family history of ischemic heart disease and other diseases of the circulatory system: Secondary | ICD-10-CM | POA: Diagnosis not present

## 2018-10-23 DIAGNOSIS — K8020 Calculus of gallbladder without cholecystitis without obstruction: Secondary | ICD-10-CM | POA: Diagnosis not present

## 2018-10-23 DIAGNOSIS — E559 Vitamin D deficiency, unspecified: Secondary | ICD-10-CM | POA: Diagnosis present

## 2018-10-23 DIAGNOSIS — Z7901 Long term (current) use of anticoagulants: Secondary | ICD-10-CM | POA: Diagnosis not present

## 2018-10-23 DIAGNOSIS — Z7982 Long term (current) use of aspirin: Secondary | ICD-10-CM | POA: Diagnosis not present

## 2018-10-23 DIAGNOSIS — K8010 Calculus of gallbladder with chronic cholecystitis without obstruction: Secondary | ICD-10-CM | POA: Diagnosis not present

## 2018-10-23 DIAGNOSIS — K219 Gastro-esophageal reflux disease without esophagitis: Secondary | ICD-10-CM | POA: Diagnosis present

## 2018-10-23 DIAGNOSIS — Z8673 Personal history of transient ischemic attack (TIA), and cerebral infarction without residual deficits: Secondary | ICD-10-CM | POA: Diagnosis not present

## 2018-10-23 LAB — HEPATIC FUNCTION PANEL
ALT: 538 U/L — ABNORMAL HIGH (ref 0–44)
AST: 790 U/L — ABNORMAL HIGH (ref 15–41)
Albumin: 3.5 g/dL (ref 3.5–5.0)
Alkaline Phosphatase: 123 U/L (ref 38–126)
Bilirubin, Direct: 2.1 mg/dL — ABNORMAL HIGH (ref 0.0–0.2)
Indirect Bilirubin: 1.8 mg/dL — ABNORMAL HIGH (ref 0.3–0.9)
Total Bilirubin: 3.9 mg/dL — ABNORMAL HIGH (ref 0.3–1.2)
Total Protein: 6 g/dL — ABNORMAL LOW (ref 6.5–8.1)

## 2018-10-23 LAB — BASIC METABOLIC PANEL
Anion gap: 9 (ref 5–15)
BUN: 27 mg/dL — ABNORMAL HIGH (ref 8–23)
CO2: 23 mmol/L (ref 22–32)
Calcium: 8.8 mg/dL — ABNORMAL LOW (ref 8.9–10.3)
Chloride: 105 mmol/L (ref 98–111)
Creatinine, Ser: 1.28 mg/dL — ABNORMAL HIGH (ref 0.44–1.00)
GFR calc Af Amer: 41 mL/min — ABNORMAL LOW (ref >60)
GFR calc non Af Amer: 36 mL/min — ABNORMAL LOW (ref >60)
Glucose, Bld: 291 mg/dL — ABNORMAL HIGH (ref 70–99)
Potassium: 3.9 mmol/L (ref 3.5–5.1)
Sodium: 137 mmol/L (ref 135–145)

## 2018-10-23 LAB — CBC WITH DIFFERENTIAL/PLATELET
Abs Immature Granulocytes: 0.19 10*3/uL — ABNORMAL HIGH (ref 0.00–0.07)
Basophils Absolute: 0 10*3/uL (ref 0.0–0.1)
Basophils Relative: 0 %
Eosinophils Absolute: 0 10*3/uL (ref 0.0–0.5)
Eosinophils Relative: 0 %
HCT: 41.9 % (ref 36.0–46.0)
Hemoglobin: 12.8 g/dL (ref 12.0–15.0)
Immature Granulocytes: 1 %
Lymphocytes Relative: 1 %
Lymphs Abs: 0.2 10*3/uL — ABNORMAL LOW (ref 0.7–4.0)
MCH: 30 pg (ref 26.0–34.0)
MCHC: 30.5 g/dL (ref 30.0–36.0)
MCV: 98.1 fL (ref 80.0–100.0)
Monocytes Absolute: 1.1 10*3/uL — ABNORMAL HIGH (ref 0.1–1.0)
Monocytes Relative: 5 %
Neutro Abs: 18.7 10*3/uL — ABNORMAL HIGH (ref 1.7–7.7)
Neutrophils Relative %: 93 %
Platelets: 171 10*3/uL (ref 150–400)
RBC: 4.27 MIL/uL (ref 3.87–5.11)
RDW: 14.7 % (ref 11.5–15.5)
WBC: 20.2 10*3/uL — ABNORMAL HIGH (ref 4.0–10.5)
nRBC: 0 % (ref 0.0–0.2)

## 2018-10-23 LAB — URINALYSIS, ROUTINE W REFLEX MICROSCOPIC
Bilirubin Urine: NEGATIVE
Glucose, UA: 50 mg/dL — AB
Ketones, ur: 20 mg/dL — AB
Leukocytes,Ua: NEGATIVE
Nitrite: NEGATIVE
Protein, ur: 30 mg/dL — AB
Specific Gravity, Urine: 1.017 (ref 1.005–1.030)
pH: 5 (ref 5.0–8.0)

## 2018-10-23 MED ORDER — SODIUM CHLORIDE 0.9 % IV SOLN
INTRAVENOUS | Status: DC
  Administered 2018-10-23: via INTRAVENOUS

## 2018-10-23 MED ORDER — DEXTROSE-NACL 5-0.9 % IV SOLN
INTRAVENOUS | Status: AC
  Administered 2018-10-23: 02:00:00 via INTRAVENOUS

## 2018-10-23 MED ORDER — METRONIDAZOLE IN NACL 5-0.79 MG/ML-% IV SOLN: 500.0000 mg | Freq: Once | INTRAVENOUS | Status: DC

## 2018-10-23 MED ORDER — HYDRALAZINE HCL 20 MG/ML IJ SOLN
5.0000 mg | INTRAMUSCULAR | Status: DC | PRN
  Administered 2018-10-24 – 2018-10-25 (×2): 5 mg via INTRAVENOUS
  Filled 2018-10-23 (×2): qty 1

## 2018-10-23 MED ORDER — INFLUENZA VAC SPLIT HIGH-DOSE 0.5 ML IM SUSY
0.5000 mL | PREFILLED_SYRINGE | INTRAMUSCULAR | Status: DC
  Filled 2018-10-23: qty 0.5

## 2018-10-23 MED ORDER — ONDANSETRON HCL 4 MG PO TABS: 4.0000 mg | ORAL_TABLET | Freq: Four times a day (QID) | ORAL | Status: DC | PRN

## 2018-10-23 MED ORDER — SODIUM CHLORIDE 0.9 % IV SOLN: 2.0000 g | Freq: Three times a day (TID) | INTRAVENOUS | Status: DC

## 2018-10-23 MED ORDER — SODIUM CHLORIDE 0.9 % IV SOLN
2.0000 g | INTRAVENOUS | Status: DC
  Administered 2018-10-23 – 2018-10-26 (×4): 2 g via INTRAVENOUS
  Filled 2018-10-23 (×4): qty 2

## 2018-10-23 MED ORDER — MORPHINE SULFATE (PF) 2 MG/ML IV SOLN: 0.5000 mg | INTRAVENOUS | Status: DC | PRN

## 2018-10-23 MED ORDER — ONDANSETRON HCL 4 MG/2ML IJ SOLN
4.0000 mg | Freq: Four times a day (QID) | INTRAMUSCULAR | Status: DC | PRN
  Administered 2018-10-25: 4 mg via INTRAVENOUS

## 2018-10-23 MED ORDER — METRONIDAZOLE IN NACL 5-0.79 MG/ML-% IV SOLN
500.0000 mg | Freq: Three times a day (TID) | INTRAVENOUS | Status: DC
  Administered 2018-10-23 – 2018-10-26 (×9): 500 mg via INTRAVENOUS
  Filled 2018-10-23 (×9): qty 100

## 2018-10-23 NOTE — H&P (Signed)
History and Physical    Alexa Mcdonald IEP:[id number] DOB: February 01, 1921 DOA: 10/22/2018  PCP: Jonathon Jordan, MD  Patient coming from: Home.  Chief Complaint: Nausea.  HPI: Alexa Mcdonald is a 85 y.o. female with history of A. fib, chronic LBBB, nonobstructive CAD, chronic kidney disease was brought to the ER by patient's son after patient was complaining of not feeling well and nauseous over the last few days.  Had no vomiting or abdominal pain no fever or chills or chest pain has been feeling weak.  ED Course: In the ER labs revealed creatinine 1.4 AST 366 ALT 185 total bilirubin 2.6 alkaline phosphatase 124.  WBC count was 17.4 patient was afebrile.  Given the elevated LFTs CT abdomen pelvis was done which shows features concerning for choledocholithiasis.  On exam abdomen appears benign.  Patient admitted for further management of choledocholithiasis.  Review of Systems: As per HPI, rest all negative.   Past Medical History:  Diagnosis Date   1st degree AV block    Bradycardia    Chronic diastolic heart failure ([place]) 02/07/2016   CKD (chronic kidney disease), stage III (HCC)    Colon, diverticulosis    Diabetes mellitus    diet controlled   Esophageal reflux    Essential hypertension    GERD (gastroesophageal reflux disease)    Hematochezia    HOH (hard of hearing)    wears bilateral hearing aids   Hx of orthostatic hypotension    Interstitial lung disease (HCC)    LBBB (left bundle branch block)    Long term current use of anticoagulant therapy    Mediastinal goiter     recurrent s/p resection 1988   Mild CAD    Mitral regurgitation    Osteopenia    Osteoporosis    Paroxysmal atrial fibrillation ([place])    a. CHA2DS2VASc = 5-->coumadin;  b. previously on amio->d/c'd 11/2014 secondary to concern for possible amio lung.   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   23 mm Edwards Sapien 3 transcatheter heart valve  placed via percutaneous right transfemoral approach   Severe aortic stenosis    Stroke ([place])    Tachycardia-bradycardia ([place])    Vitamin D deficiency     Past Surgical History:  Procedure Laterality Date   CARDIAC CATHETERIZATION N/A 10/31/2015   Procedure: Right/Left Heart Cath and Coronary Angiography;  Surgeon: [name], MD;  Location: [facility];  Service: Cardiovascular;  Laterality: N/A;   CARPAL TUNNEL RELEASE Bilateral    CATARACT EXTRACTION Bilateral    CESAREAN SECTION     x3   mediastinal removal of a goiter     MRI  08/18/07   head (diabetes heart [facility] at [facility])   TEE WITHOUT CARDIOVERSION N/A 11/27/2015   Procedure: TRANSESOPHAGEAL ECHOCARDIOGRAM (TEE);  Surgeon: [name], MD;  Location: [facility];  Service: Open Heart Surgery;  Laterality: N/A;   TRANSCATHETER AORTIC VALVE REPLACEMENT, TRANSFEMORAL N/A 11/27/2015   Procedure: TRANSCATHETER AORTIC VALVE REPLACEMENT, TRANSFEMORAL;  Surgeon: [name], MD;  Location: [place];  Service: Open Heart Surgery;  Laterality: N/A;     reports that she has never smoked. She has never used smokeless tobacco. She reports that she does not drink alcohol or use drugs.  Allergies  Allergen Reactions   Amoxicillin Other (See Comments)    tongue felt bruised Did it involve swelling of the face/tongue/throat, SOB, or low BP? No Did it involve sudden or severe rash/hives, skin peeling,  or any reaction on the inside of your mouth or nose? No Did you need to seek medical attention at a hospital or doctor's office? No When did it last happen?childhood If all above answers are NO, may proceed with cephalosporin use.     Celebrex [Celecoxib] Other (See Comments)    Mouth sores    Family History  Problem Relation Age of Onset   Diabetes Mother    Cancer - Prostate Father    Pulmonary embolism Father    Hypertension Sister    Diabetes Sister    Hypertension Brother    Arrhythmia Brother          afib   CAD Brother    Pulmonary fibrosis Brother     Prior to Admission medications   Medication Sig Start Date End Date Taking? Authorizing Provider  aspirin EC 81 MG tablet Take 81 mg by mouth daily.   Yes [provider]  atorvastatin (LIPITOR) 10 MG tablet Take 10 mg daily by mouth.   Yes [provider]  b complex vitamins tablet Take 1 tablet by mouth daily.   Yes [provider]  CALCIUM PO Take 1 tablet by mouth daily.   Yes [provider]  edoxaban (SAVAYSA) 30 MG TABS tablet Take 30 mg by mouth daily.   Yes [provider]  lisinopril (PRINIVIL,ZESTRIL) 10 MG tablet Take 1 tablet (10 mg total) by mouth daily. Please call our office to schedule an appt for future refills (2nd attempt) 08/02/18  Yes Dunn, Dayna N, PA-C  methimazole (TAPAZOLE) 10 MG tablet Take 5 mg by mouth daily. 09/27/18  Yes [provider]  Multiple Vitamins-Minerals (OCUVITE PRESERVISION PO) Take 1 capsule by mouth daily.    Yes [provider]  RESTASIS MULTIDOSE 0.05 % ophthalmic emulsion Apply 1 drop to eye daily. 08/13/17  Yes [provider]  VITAMIN D PO Take 1 tablet by mouth daily.   Yes [provider]  clindamycin (CLEOCIN) 300 MG capsule Take 2 capsules by mouth one hour prior to dental appointment 11/14/16   [name], MD    Physical Exam: Vitals:   10/22/18 1942 10/22/18 2034 10/22/18 2152  BP: (!) 167/75    Pulse: 71    Resp: 16    Temp: 98.9 F (37.2 C)  98.7 F (37.1 C)  TempSrc: Oral  Oral  SpO2: 98%    Weight:  60 kg   Height: 5\' 2"  (1.575 m) 5\' 2"  (1.575 m)       Constitutional: Moderately built and nourished. Vitals:   10/22/18 1942 10/22/18 2034 10/22/18 2152  BP: (!) 167/75    Pulse: 71    Resp: 16    Temp: 98.9 F (37.2 C)  98.7 F (37.1 C)  TempSrc: Oral  Oral  SpO2: 98%    Weight:  60 kg   Height: 5\' 2"  (1.575 m) 5\' 2"  (1.575 m)    Eyes: Anicteric no pallor. ENMT: No  discharge from the ears eyes nose and mouth. Neck: No mass felt.  No neck rigidity. Respiratory: No rhonchi or crepitations. Cardiovascular: S1-S2 heard. Abdomen: Soft nontender bowel sounds present. Musculoskeletal: No edema.  No joint effusion. Skin: No rash. Neurologic: Alert awake oriented to time place and person.  Moves all extremities. Psychiatric: Appears normal.  Normal affect.   Labs on Admission: I have personally reviewed following labs and imaging studies  CBC: Recent Labs  Lab 10/22/18 2036  WBC 17.4*  HGB 14.3  HCT 43.9  MCV 94.6  PLT 062   Basic Metabolic Panel: Recent Labs  Lab 10/22/18 2036  NA 138  K 4.2  CL 102  CO2 23  GLUCOSE 209*  BUN 27*  CREATININE 1.42*  CALCIUM 9.1   GFR: Estimated Creatinine Clearance: 18.7 mL/min (A) (by C-G formula based on SCr of 1.42 mg/dL (H)). Liver Function Tests: Recent Labs  Lab 10/22/18 2036  AST 366*  ALT 185*  ALKPHOS 124  BILITOT 2.6*  PROT 7.4  ALBUMIN 4.3   Recent Labs  Lab 10/22/18 2036  LIPASE 30   No results for input(s): AMMONIA in the last 168 hours. Coagulation Profile: No results for input(s): INR, PROTIME in the last 168 hours. Cardiac Enzymes: No results for input(s): CKTOTAL, CKMB, CKMBINDEX, TROPONINI in the last 168 hours. BNP (last 3 results) No results for input(s): PROBNP in the last 8760 hours. HbA1C: No results for input(s): HGBA1C in the last 72 hours. CBG: No results for input(s): GLUCAP in the last 168 hours. Lipid Profile: No results for input(s): CHOL, HDL, LDLCALC, TRIG, CHOLHDL, LDLDIRECT in the last 72 hours. Thyroid Function Tests: No results for input(s): TSH, T4TOTAL, FREET4, T3FREE, THYROIDAB in the last 72 hours. Anemia Panel: No results for input(s): VITAMINB12, FOLATE, FERRITIN, TIBC, IRON, RETICCTPCT in the last 72 hours. Urine analysis:    Component Value Date/Time   COLORURINE YELLOW 11/26/2015 [place] 11/26/2015 1511   LABSPEC  1.016 11/26/2015 1511   PHURINE 6.0 11/26/2015 1511   GLUCOSEU NEGATIVE 11/26/2015 1511   HGBUR LARGE (A) 11/26/2015 1511   BILIRUBINUR NEGATIVE 11/26/2015 1511   KETONESUR NEGATIVE 11/26/2015 1511   PROTEINUR 30 (A) 11/26/2015 1511   NITRITE NEGATIVE 11/26/2015 1511   LEUKOCYTESUR MODERATE (A) 11/26/2015 1511   Sepsis Labs: @LABRCNTIP (procalcitonin:4,lacticidven:4) )No results found for this or any previous visit (from the past 240 hour(s)).   Radiological Exams on Admission: Ct Abdomen Pelvis Wo Contrast  Result Date: 10/22/2018 CLINICAL DATA:  Nausea and loss of appetite. EXAM: CT ABDOMEN AND PELVIS WITHOUT CONTRAST TECHNIQUE: Multidetector CT imaging of the abdomen and pelvis was performed following the standard protocol without IV contrast. COMPARISON:  Body CT 10/24/2015 FINDINGS: Lower chest: Chronic interstitial lung changes. Enlarged of the cardiac silhouette. Heavy calcifications of the mitral valve annulus. Artificial aortic valve noted. Hepatobiliary: Is normal appearance of the liver. In the gallbladder as dilated. There is dilation of the extrahepatic common bile duct as well. High density material within the distal common bile duct may represent obstructive choledocholithiasis. Pancreas: Unremarkable. No pancreatic ductal dilatation or surrounding inflammatory changes. Spleen: Normal in size without focal abnormality. Adrenals/Urinary Tract: Adrenal glands are unremarkable. Bilateral renal cortical thinning and perinephric fat stranding, nonspecific. Kidneys are without renal calculi, focal lesion, or hydronephrosis. Bladder is unremarkable. Stomach/Bowel: Stomach is within normal limits. No evidence of bowel wall thickening, distention, or inflammatory changes. Diffuse colonic diverticulosis without evidence of diverticulitis. Vascular/Lymphatic: Aortic atherosclerosis. No enlarged abdominal or pelvic lymph nodes. Reproductive: Uterus and bilateral adnexa are unremarkable. Other: No  abdominal wall hernia or abnormality. No abdominopelvic ascites. Musculoskeletal: No acute or significant osseous findings. IMPRESSION: 1. Dilation of the gallbladder and common bile duct with high density material within the distal common bile duct which is suspicious for obstructive choledocholithiasis. 2. Diffuse colonic diverticulosis without evidence of diverticulitis. Electronically Signed   By: Fidela Salisbury M.D.   On: 10/22/2018 22:56    EKG: Independently reviewed.  Normal sinus rhythm LBBB.  Assessment/Plan Principal Problem:  Choledocholithiasis with obstruction Active Problems:   Essential hypertension   CKD (chronic kidney disease), stage III (HCC)   Interstitial lung disease (HCC)   S/P TAVR (transcatheter aortic valve replacement)   Paroxysmal atrial fibrillation ([place])    1. Choledocholithiasis with obstruction -symptoms and labs and CAT scan findings are concerning for this.  We will keep patient on empiric antibiotics consult GI.  N.p.o.  Follow LFTs. 2. History of A. fib presently rate controlled.  Patient is on Savaysa which will be held in anticipation of procedure.  May need heparin bridging if procedure is planned another days and tomorrow. 3. Hypertension we will keep patient on PRN IV hydralazine. 4. Chronic disease stage III creatinine appears to be at baseline. 5. Chronic LBBB and nonobstructive CAD denies any chest pain. 6. History of aortic stenosis status post TAVR.   DVT prophylaxis: SCDs. Code Status: Full code. Family Communication: Discussed with patient. Disposition Plan: Home. Consults called: We will consult GI. Admission status: Inpatient.   Rise Patience MD [facility] Pager [phone].  If 7PM-7AM, please contact night-coverage [email or web address] Password [facility]  10/23/2018, 12:21 AM

## 2018-10-23 NOTE — Progress Notes (Signed)
  PROGRESS NOTE  CASSUNDRA MCKEEVER NLZ:[id number] DOB: 12-17-1920 DOA: 10/22/2018 PCP: Jonathon Jordan, MD  Brief History   The patient is a 85 yr old woman who presented to the ED with a complaint of nausea and not feeling well for a few days. She was found to have an elvated creatinine,, AST, ALT, and bilirubin. WBC was 17.4. CT was concerning for choledocholithiasis.   The patient has been admitted to a medical bed. GI was consulted. She has been started on cefepime and flagyl. Blood cultures have been obtained.   Consultants  . Gastroenterology   Procedures  . None  Antibiotics  . Cefepime and Flagyl  Interval History/Subjective  See above  Objective   Vitals:  Vitals:   10/23/18 0650 10/23/18 1353  BP: 129/69 (!) 138/59  Pulse: 61 (!) 44  Resp: 18 19  Temp: 98.2 F (36.8 C) 97.7 F (36.5 C)  SpO2: 97% 97%    Exam:  Constitutional:  . The patient is elderly, weak, and frail. She is awake, alert, and oriented x 3. No acute distress. Respiratory:  . No wheezes, rales, or rhonchi. . No tactile fremitus . No increased work of breathing. No wheezes, rales, or rhonchi. Cardiovascular:  . Regular rate and rhythm.  . No murmurs, ectopy, or gallups . No lateral PMI. No thrills . Normal pedal pulses Abdomen:  . Abdomen is soft, non-tender, non-distended. . No hernias, masses, or organomegaly are appreciated. [name] Hypoactive bowel sounds. Musculoskeletal:  . NO cyanosis, clubbing or edema Skin:  . No rashes, lesions, ulcers . palpation of skin: no induration or nodules Neurologic:  . CN 2-12 intact . Moving all extremities. Psychiatric:  . Mental status o Mood, affect appropriate  . judgment and insight appear intact     I have personally reviewed the following:   Today's Data  . CMP, CBC, Vitals  Scheduled Meds: . [START ON 10/24/2018] Influenza vac split quadrivalent PF  0.5 mL Intramuscular Tomorrow-1000   Continuous Infusions: . cefTRIAXone  (ROCEPHIN)  IV 2 g (10/23/18 1033)  . dextrose 5 % and 0.9% NaCl 75 mL/hr at 10/23/18 0200  . metronidazole 500 mg (10/23/18 9024)    Principal Problem:   Choledocholithiasis with obstruction Active Problems:   Essential hypertension   CKD (chronic kidney disease), stage III (HCC)   Interstitial lung disease (HCC)   S/P TAVR (transcatheter aortic valve replacement)   Paroxysmal atrial fibrillation (HCC)   A & P  Choledocholithiasis with obstruction: AST up to 790. ALT is now 538. Direct bilirubin is 2.1. Indirect is 1.8. T bili 3.9. up from 2.6. WBC is 20.2. GI is consulted. We appreciate their help. She is receiving IV Rocephin and Flagyl.  CKD Stage III: Creatinine 1.28 down from 1.57.   Essential Hypertension: Well controlled on IV hydralazine as needed.   S/P TAVR: Noted. Savaysa held for presumed procedures.  Interstitial Lung Disease: Noted. The patient is saturating 97% on room air.  Paroxysmal Atrial Fibrillation: Rate is controlled. [facility] Cancer is held for possible procedures.  DVT prophylaxis: SCD's Code Status: Full Code Family Communication: Son is in room. All questions addressed to the best of my ability. Disposition Plan: home.   Latiana Tomei, DO [facility] Direct contact: see [email or web address]  7PM-7AM contact night coverage as above 10/23/2018, 2:50 PM  LOS: 0 days    LOS: 0 days

## 2018-10-23 NOTE — ED Notes (Signed)
ED TO INPATIENT HANDOFF REPORT  ED Nurse Name and Phone #: [phone]  S Name/Age/Gender Alexa Mcdonald 85 y.o. female Room/Bed: WA03/WA03  Code Status   Code Status: Full Code  Home/SNF/Other Home Patient oriented to: self and place Is this baseline? No   Triage Complete: Triage complete  Chief Complaint Emesis  Triage Note Pt stated "I haven't been feeling well all day.  I haven't eaten much today."  Pt is dry heaving in Triage, clear mucus noted.  Pt denies diarrhea/pain.   Allergies Allergies  Allergen Reactions  . Amoxicillin Other (See Comments)    tongue felt bruised Did it involve swelling of the face/tongue/throat, SOB, or low BP? No Did it involve sudden or severe rash/hives, skin peeling, or any reaction on the inside of your mouth or nose? No Did you need to seek medical attention at a hospital or doctor's office? No When did it last happen?childhood If all above answers are "NO", may proceed with cephalosporin use.    . Celebrex [Celecoxib] Other (See Comments)    Mouth sores    Level of Care/Admitting Diagnosis ED Disposition    ED Disposition Condition Comment   Admit  Hospital Area: [place] [[id number]]  Level of Care: Telemetry [5]  Admit to tele based on following criteria: Monitor for Ischemic changes  Diagnosis: Choledocholithiasis with obstruction [876811]  Admitting Physician: Rise Patience [phone]  Attending Physician: Rise Patience [phone]  Estimated length of stay: past midnight tomorrow  Certification:: I certify this patient will need inpatient services for at least 2 midnights  PT Class (Do Not Modify): Inpatient [101]  PT Acc Code (Do Not Modify): Private [1]       B Medical/Surgery History Past Medical History:  Diagnosis Date  . 1st degree AV block   . Bradycardia   . Chronic diastolic heart failure ([place]) 02/07/2016  . CKD (chronic kidney disease), stage III ([place])   . Colon,  diverticulosis   . Diabetes mellitus    diet controlled  . Esophageal reflux   . Essential hypertension   . GERD (gastroesophageal reflux disease)   . Hematochezia   . HOH (hard of hearing)    wears bilateral hearing aids  . Hx of orthostatic hypotension   . Interstitial lung disease ([place])   . LBBB (left bundle branch block)   . Long term current use of anticoagulant therapy   . Mediastinal goiter     recurrent s/p resection 1988  . Mild CAD   . Mitral regurgitation   . Osteopenia   . Osteoporosis   . Paroxysmal atrial fibrillation (HCC)    a. CHA2DS2VASc = 5-->coumadin;  b. previously on amio->d/c'd 11/2014 secondary to concern for possible amio lung.  . Pneumonia   . 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 Severe aortic stenosis   . Stroke ([place])   . Tachycardia-bradycardia ([place])   . Vitamin D deficiency    Past Surgical History:  Procedure Laterality Date  . CARDIAC CATHETERIZATION N/A 10/31/2015   Procedure: Right/Left Heart Cath and Coronary Angiography;  Surgeon: [name], MD;  Location: [facility];  Service: Cardiovascular;  Laterality: N/A;  . CARPAL TUNNEL RELEASE Bilateral   . CATARACT EXTRACTION Bilateral   . CESAREAN SECTION     x3  . mediastinal removal of a goiter    . MRI  08/18/07  head (diabetes heart study at [facility])  . TEE WITHOUT CARDIOVERSION N/A 11/27/2015   Procedure: TRANSESOPHAGEAL ECHOCARDIOGRAM (TEE);  Surgeon: [name], MD;  Location: [facility];  Service: Open Heart Surgery;  Laterality: N/A;  . TRANSCATHETER AORTIC VALVE REPLACEMENT, TRANSFEMORAL N/A 11/27/2015   Procedure: TRANSCATHETER AORTIC VALVE REPLACEMENT, TRANSFEMORAL;  Surgeon: [name], MD;  Location: [facility];  Service: Open Heart Surgery;  Laterality: N/A;     A IV Location/Drains/Wounds Patient Lines/Drains/Airways Status   Active  Line/Drains/Airways    Name:   Placement date:   Placement time:   Site:   Days:   Peripheral IV 10/22/18 Left Forearm   10/22/18    2033    Forearm   1          Intake/Output Last 24 hours  Intake/Output Summary (Last 24 hours) at 10/23/2018 0118 Last data filed at 10/22/2018 2245 Gross per 24 hour  Intake 500 ml  Output -  Net 500 ml    Labs/Imaging Results for orders placed or performed during the hospital encounter of 10/22/18 (from the past 48 hour(s))  Lipase, blood     Status: None   Collection Time: 10/22/18  8:36 PM  Result Value Ref Range   Lipase 30 11 - 51 U/L    Comment: Performed at [facility], [place] [address], [place], [place] [address]  Comprehensive metabolic panel     Status: Abnormal   Collection Time: 10/22/18  8:36 PM  Result Value Ref Range   Sodium 138 135 - 145 mmol/L   Potassium 4.2 3.5 - 5.1 mmol/L   Chloride 102 98 - 111 mmol/L   CO2 23 22 - 32 mmol/L   Glucose, Bld 209 (H) 70 - 99 mg/dL   BUN 27 (H) 8 - 23 mg/dL   Creatinine, Ser 1.42 (H) 0.44 - 1.00 mg/dL   Calcium 9.1 8.9 - 10.3 mg/dL   Total Protein 7.4 6.5 - 8.1 g/dL   Albumin 4.3 3.5 - 5.0 g/dL   AST 366 (H) 15 - 41 U/L    Comment: RESULTS CONFIRMED BY MANUAL DILUTION   ALT 185 (H) 0 - 44 U/L   Alkaline Phosphatase 124 38 - 126 U/L   Total Bilirubin 2.6 (H) 0.3 - 1.2 mg/dL   GFR calc non Af Amer 31 (L) >60 mL/min   GFR calc Af Amer 36 (L) >60 mL/min   Anion gap 13 5 - 15    Comment: Performed at [facility], [place] [address], [place], [place] [address]  CBC     Status: Abnormal   Collection Time: 10/22/18  8:36 PM  Result Value Ref Range   WBC 17.4 (H) 4.0 - 10.5 K/uL   RBC 4.64 3.87 - 5.11 MIL/uL   Hemoglobin 14.3 12.0 - 15.0 g/dL   HCT 43.9 36.0 - 46.0 %   MCV 94.6 80.0 - 100.0 fL   MCH 30.8 26.0 - 34.0 pg   MCHC 32.6 30.0 - 36.0 g/dL   RDW 14.6 11.5 - 15.5 %   Platelets 233 150 - 400 K/uL   nRBC 0.0 0.0 - 0.2 %    Comment: Performed at  [facility], [place] [address], [place], [place] [address]  I-Stat Troponin, ED (not at [facility])     Status: None   Collection Time: 10/22/18  8:43 PM  Result Value Ref Range   Troponin i, poc 0.02 0.00 - 0.08 ng/mL   Comment 3  Comment: Due to the release kinetics of cTnI, a negative result within the first hours of the onset of symptoms does not rule out myocardial infarction with certainty. If myocardial infarction is still suspected, repeat the test at appropriate intervals.   Urinalysis, Routine w reflex microscopic     Status: Abnormal   Collection Time: 10/22/18 11:41 PM  Result Value Ref Range   Color, Urine YELLOW YELLOW   APPearance HAZY (A) CLEAR   Specific Gravity, Urine 1.017 1.005 - 1.030   pH 5.0 5.0 - 8.0   Glucose, UA 50 (A) NEGATIVE mg/dL   Hgb urine dipstick MODERATE (A) NEGATIVE   Bilirubin Urine NEGATIVE NEGATIVE   Ketones, ur 20 (A) NEGATIVE mg/dL   Protein, ur 30 (A) NEGATIVE mg/dL   Nitrite NEGATIVE NEGATIVE   Leukocytes,Ua NEGATIVE NEGATIVE   RBC / HPF 21-50 0 - 5 RBC/hpf   WBC, UA 0-5 0 - 5 WBC/hpf   Bacteria, UA RARE (A) NONE SEEN   Squamous Epithelial / LPF 0-5 0 - 5   Mucus PRESENT     Comment: Performed at [facility], [place] [address], [place], [place] [address]   Ct Abdomen Pelvis Wo Contrast  Result Date: 10/22/2018 CLINICAL DATA:  Nausea and loss of appetite. EXAM: CT ABDOMEN AND PELVIS WITHOUT CONTRAST TECHNIQUE: Multidetector CT imaging of the abdomen and pelvis was performed following the standard protocol without IV contrast. COMPARISON:  Body CT 10/24/2015 FINDINGS: Lower chest: Chronic interstitial lung changes. Enlarged of the cardiac silhouette. Heavy calcifications of the mitral valve annulus. Artificial aortic valve noted. Hepatobiliary: Is normal appearance of the liver. In the gallbladder as dilated. There is dilation of the extrahepatic common bile duct as well. High density material within the  distal common bile duct may represent obstructive choledocholithiasis. Pancreas: Unremarkable. No pancreatic ductal dilatation or surrounding inflammatory changes. Spleen: Normal in size without focal abnormality. Adrenals/Urinary Tract: Adrenal glands are unremarkable. Bilateral renal cortical thinning and perinephric fat stranding, nonspecific. Kidneys are without renal calculi, focal lesion, or hydronephrosis. Bladder is unremarkable. Stomach/Bowel: Stomach is within normal limits. No evidence of bowel wall thickening, distention, or inflammatory changes. Diffuse colonic diverticulosis without evidence of diverticulitis. Vascular/Lymphatic: Aortic atherosclerosis. No enlarged abdominal or pelvic lymph nodes. Reproductive: Uterus and bilateral adnexa are unremarkable. Other: No abdominal wall hernia or abnormality. No abdominopelvic ascites. Musculoskeletal: No acute or significant osseous findings. IMPRESSION: 1. Dilation of the gallbladder and common bile duct with high density material within the distal common bile duct which is suspicious for obstructive choledocholithiasis. 2. Diffuse colonic diverticulosis without evidence of diverticulitis. Electronically Signed   By: Fidela Salisbury M.D.   On: 10/22/2018 22:56    Pending Labs Unresulted Labs (From admission, onward)    Start     Ordered   10/23/18 8841  Basic metabolic panel  Tomorrow morning,   R     10/23/18 0021   10/23/18 0500  Hepatic function panel  Tomorrow morning,   R     10/23/18 0021   10/23/18 0500  CBC WITH DIFFERENTIAL  Tomorrow morning,   R     10/23/18 0021          Vitals/Pain Today's Vitals   10/22/18 2152 10/22/18 2200 10/23/18 0000 10/23/18 0100  BP:  (!) 145/112 (!) 182/82 (!) 165/75  Pulse:  78 77 86  Resp:  (!) 27 (!) 23 (!) 21  Temp: 98.7 F (37.1 C)   98.2 F (36.8 C)  TempSrc: Oral  Oral  SpO2:  95% 95% 94%  Weight:      Height:      PainSc:    0-No pain    Isolation Precautions No active  isolations  Medications Medications  ceFEPIme (MAXIPIME) 2 g in sodium chloride 0.9 % 100 mL IVPB (has no administration in time range)    And  metroNIDAZOLE (FLAGYL) IVPB 500 mg (500 mg Intravenous New Bag/Given 10/22/18 2351)  ondansetron (ZOFRAN) tablet 4 mg (has no administration in time range)    Or  ondansetron (ZOFRAN) injection 4 mg (has no administration in time range)  dextrose 5 %-0.9 % sodium chloride infusion (has no administration in time range)  morphine 2 MG/ML injection 0.5 mg (has no administration in time range)  cefTRIAXone (ROCEPHIN) 1 g in sodium chloride 0.9 % 100 mL IVPB (has no administration in time range)  metroNIDAZOLE (FLAGYL) IVPB 500 mg (has no administration in time range)  hydrALAZINE (APRESOLINE) injection 5 mg (has no administration in time range)  sodium chloride flush (NS) 0.9 % injection 3 mL (3 mLs Intravenous Given 10/22/18 2037)  sodium chloride 0.9 % bolus 500 mL (0 mLs Intravenous Stopped 10/22/18 2245)    Mobility walks with device Low fall risk   Focused Assessments Cardiac Assessment Handoff:  Cardiac Rhythm: Atrial fibrillation No results found for: CKTOTAL, CKMB, CKMBINDEX, TROPONINI No results found for: DDIMER Does the Patient currently have chest pain? No      R Recommendations: See Admitting Provider Note  Report given to:   Additional Notes: HAVE A GOOD NIGHT!

## 2018-10-23 NOTE — ED Notes (Signed)
WILL TRANSPORT PT TO 4E 1431-1. AAOX2. PT IN NO APPARENT DISTRESS OR PAIN. IVF INFUSING W/O PAIN OR SWELLING. THE OPPORTUNITY TO ASK QUESTIONS WAS PROVIDED.

## 2018-10-23 NOTE — Consult Note (Addendum)
Consultation  Referring Provider: No ref. provider found Primary Care Physician:  Jonathon Jordan, MD Primary Gastroenterologist:  Dr.  Luiz Iron for Consultation: Abnormal CT    ASSESSMENT/PLAN   85 year old with A. fib on savaysa (last dose 3/20), CKD3, LBBB, AS s/p TAVR with obstructive jaundice, abn LFTs with CT s/o choledocholithiasis with obstruction and early ascending cholangitis. GB still in. Plan: Broad-spectrum antibiotics, trend LFTs, CBC.  Hold Savaysa. ERCP with possible ES/stent insertion tomorrow. CT reviewed independently.    I have discussed risks and benefits in detail with the patient and patient's son [name] in detail including risks of pancreatitis, perforation, bleeding, small risks of dying from the procedure as well.  The benefits were also discussed.  His contact numbers are ([phone])    HPI: Alexa Mcdonald is a 85 y.o. female brought in to the ER patient's son complaining of feeling unwell, nausea over the last few days with decreased appetite.  She denies having any nausea or vomiting.  No significant abdominal pain.  No fever or chills.  Was found to have abnormal liver function tests.  Underwent CT scan of the abdomen pelvis which showed choledocholithiasis.  Being admitted for further evaluation.  Has been on savaysa for atrial fibrillation.  Has elevated WBC count despite antibiotics.  Today had bradycardia.  Not taking any beta-blockers.  Son By bedside.   Past Medical History:  Diagnosis Date  . 1st degree AV block   . Bradycardia   . Chronic diastolic heart failure ([place]) 02/07/2016  . CKD (chronic kidney disease), stage III ([place])   . Colon, diverticulosis   . Diabetes mellitus    diet controlled  . Esophageal reflux   . Essential hypertension   . GERD (gastroesophageal reflux disease)   . Hematochezia   . HOH (hard of hearing)    wears bilateral hearing aids  . Hx of orthostatic hypotension   . Interstitial lung disease  ([place])   . LBBB (left bundle branch block)   . Long term current use of anticoagulant therapy   . Mediastinal goiter     recurrent s/p resection 1988  . Mild CAD   . Mitral regurgitation   . Osteopenia   . Osteoporosis   . Paroxysmal atrial fibrillation (HCC)    a. CHA2DS2VASc = 5-->coumadin;  b. previously on amio->d/c'd 11/2014 secondary to concern for possible amio lung.  . Pneumonia   . 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 Severe aortic stenosis   . Stroke ([place])   . Tachycardia-bradycardia ([place])   . Vitamin D deficiency     Past Surgical History:  Procedure Laterality Date  . CARDIAC CATHETERIZATION N/A 10/31/2015   Procedure: Right/Left Heart Cath and Coronary Angiography;  Surgeon: [name], MD;  Location: [facility];  Service: Cardiovascular;  Laterality: N/A;  . CARPAL TUNNEL RELEASE Bilateral   . CATARACT EXTRACTION Bilateral   . CESAREAN SECTION     x3  . mediastinal removal of a goiter    . MRI  08/18/07   head (di[facility])  . TEE WITHOUT CARDIOVERSION N/A 11/27/2015   Procedure: TRANSESOPHAGEAL ECHOCARDIOGRAM (TEE);  Surgeon: [name], MD;  Location: [facility];  Service: Open Heart Surgery;  Laterality: N/A;  . TRANSCATHETER AORTIC VALVE REPLACEMENT, TRANSFEMORAL N/A 11/27/2015   Procedure: TRANSCATHETER AORTIC VALVE REPLACEMENT, TRANSFEMORAL;  Surgeon: [name], MD;  Location: [facility];  Service: Open Heart Surgery;  Laterality: N/A;    Prior to Admission medications   Medication Sig Start Date End Date Taking? Authorizing Provider  aspirin EC 81 MG tablet Take 81 mg by mouth daily.   Yes [provider]  atorvastatin (LIPITOR) 10 MG tablet Take 10 mg daily by mouth.   Yes [provider]  b complex vitamins tablet Take 1 tablet by mouth daily.   Yes [provider]  CALCIUM PO  Take 1 tablet by mouth daily.   Yes [provider]  edoxaban (SAVAYSA) 30 MG TABS tablet Take 30 mg by mouth daily.   Yes [provider]  lisinopril (PRINIVIL,ZESTRIL) 10 MG tablet Take 1 tablet (10 mg total) by mouth daily. Please call our office to schedule an appt for future refills (2nd attempt) 08/02/18  Yes Dunn, Dayna N, PA-C  methimazole (TAPAZOLE) 10 MG tablet Take 5 mg by mouth daily. 09/27/18  Yes [provider]  Multiple Vitamins-Minerals (OCUVITE PRESERVISION PO) Take 1 capsule by mouth daily.    Yes [provider]  RESTASIS MULTIDOSE 0.05 % ophthalmic emulsion Apply 1 drop to eye daily. 08/13/17  Yes [provider]  VITAMIN D PO Take 1 tablet by mouth daily.   Yes [provider]  clindamycin (CLEOCIN) 300 MG capsule Take 2 capsules by mouth one hour prior to dental appointment 11/14/16   [name], MD    Current Facility-Administered Medications  Medication Dose Route Frequency Provider Last Rate Last Dose  . cefTRIAXone (ROCEPHIN) 2 g in sodium chloride 0.9 % 100 mL IVPB  2 g Intravenous Q24H Rise Patience, MD 200 mL/hr at 10/23/18 1033 2 g at 10/23/18 1033  . dextrose 5 %-0.9 % sodium chloride infusion   Intravenous Continuous Rise Patience, MD 75 mL/hr at 10/23/18 0200    . hydrALAZINE (APRESOLINE) injection 5 mg  5 mg Intravenous Q4H PRN Rise Patience, MD      . Derrill Memo ON 10/24/2018] Influenza vac split quadrivalent PF (FLUZONE HIGH-DOSE) injection 0.5 mL  0.5 mL Intramuscular Tomorrow-1000 Rise Patience, MD      . metroNIDAZOLE (FLAGYL) IVPB 500 mg  500 mg Intravenous Q8H Rise Patience, MD 100 mL/hr at 10/23/18 1521 500 mg at 10/23/18 1521  . morphine 2 MG/ML injection 0.5 mg  0.5 mg Intravenous Q4H PRN Rise Patience, MD      . ondansetron [facility]) tablet 4 mg  4 mg Oral Q6H PRN Rise Patience, MD       Or  . ondansetron [facility]) injection 4 mg  4 mg Intravenous Q6H PRN  Rise Patience, MD        Allergies as of 10/22/2018 - Review Complete 10/22/2018  Allergen Reaction Noted  . Amoxicillin Other (See Comments) 11/25/2011  . Celebrex [celecoxib] Other (See Comments) 11/25/2011    Family History  Problem Relation Age of Onset  . Diabetes Mother   . Cancer - Prostate Father   . Pulmonary embolism Father   . Hypertension Sister   . Diabetes Sister   . Hypertension Brother   . Arrhythmia Brother        afib  . CAD Brother   . Pulmonary fibrosis Brother     Social History   Socioeconomic History  . Marital status: Widowed    Spouse name: Not on file  . Number of children: 3  . Years of education: Not on file  . Highest education level: Not on file  Occupational History  . Occupation: retire  Social Needs  . Financial resource strain: Not on file  . Food insecurity:    Worry: Not on file    Inability: Not on file  . Transportation needs:    Medical: Not on file    Non-medical: Not on file  Tobacco Use  . Smoking status: Never Smoker  . Smokeless tobacco: Never Used  Substance and Sexual Activity  . Alcohol use: No    Alcohol/week: 0.0 standard drinks  . Drug use: No  . Sexual activity: Not on file  Lifestyle  . Physical activity:    Days per week: Not on file    Minutes per session: Not on file  . Stress: Not on file  Relationships  . Social connections:    Talks on phone: Not on file    Gets together: Not on file    Attends religious service: Not on file    Active member of club or organization: Not on file    Attends meetings of clubs or organizations: Not on file    Relationship status: Not on file  . Intimate partner violence:    Fear of current or ex partner: Not on file    Emotionally abused: Not on file    Physically abused: Not on file    Forced sexual activity: Not on file  Other Topics Concern  . Not on file  Social History Narrative   Lives in [place].  Widowed.    Review of Systems: General:  Denies any fever, chills, night sweats, anorexia, fatigue, weakness, malaise, weight loss.  Skin: Denies rash, itching, dry skin, hives, moles, warts, or unhealing ulcers.  HEENT: Not present - nasal congestion, sinus pain, hoarseness and sore throat. Respiratory: Denies dyspnea at rest, dyspnea with exercise, cough, sputum, wheezing, coughing up blood. Cardiovascular: Denies any chest pain, angina, palpitations, syncope, orthopnea, PND, peripheral edema, and claudication. GU : Denies urinary burning, blood in urine, urinary frequency, urinary hesitancy, nocturnal urination, and urinary incontinence. Musculoskeletal: denies joint pain, extremity pain. Denies muscle weakness, cramps, atrophy.  Neurological:  Denies any headaches, dizziness, paresthesias and seizures. Psych: Denies depression, anxiety or insomnia. No memory loss, suicidal ideation, hallucinations, paranoia, and confusion. Heme: Denies bruising, bleeding, and enlarged lymph nodes or prolonged bleeding.  Physical Exam: Vital signs in last 24 hours: Temp:  [97.7 F (36.5 C)-98.9 F (37.2 C)] 97.7 F (36.5 C) (03/21 1353) Pulse Rate:  [44-86] 44 (03/21 1353) Resp:  [16-27] 19 (03/21 1353) BP: (129-182)/(59-112) 138/59 (03/21 1353) SpO2:  [94 %-98 %] 97 % (03/21 1353) Weight:  [60 kg-60.4 kg] 60.4 kg (03/21 0223) Last BM Date: 10/23/18 General:   Alert,  Well-developed, cooperative, not in acute distress. Head:  Normocephalic and atraumatic. Eyes:  Sclera clear, no icterus.   Conjunctiva pink. Ears:  Normal auditory acuity. Nose:  No deformity, discharge,  or lesions. Mouth:  No deformity or lesions.   Neck:  Supple; no masses or thyromegaly. Lungs:  Clear throughout to auscultation.   No wheezes, crackles, or rhonchi.  Heart:  Regular rate and rhythm; no murmurs, clicks, rubs,  or gallops. Abdomen: Inspection: No visible peristalsis, no abnormal pulsations, skin is normal.  Palpation/percussion : Nontender, no rigidity, no  abnormal dullness to percussion, no hepatosplenomegaly or palpable abdominal masses.  Auscultation: Normal bowel sounds and no abdominal bruits.    Rectal:  Deferred  Extremities:  Without clubbing or edema. Neurologic:  Alert and  oriented x 3;  grossly normal neurologically. Skin:  Intact without significant lesions or rashes.. Psych:  Alert and cooperative. Normal mood and affect.  Intake/Output from previous day: 03/20 0701 - 03/21 0700 In: 777 [I.V.:77; IV Piggyback:700] Out: -  Intake/Output this shift: No intake/output data recorded.  Lab Results: Recent Labs    10/22/18 2036 10/23/18 0341  WBC 17.4* 20.2*  HGB 14.3 12.8  HCT 43.9 41.9  PLT 233 171   BMET Recent Labs    10/22/18 2036 10/23/18 0341  NA 138 137  K 4.2 3.9  CL 102 105  CO2 23 23  GLUCOSE 209* 291*  BUN 27* 27*  CREATININE 1.42* 1.28*  CALCIUM 9.1 8.8*   LFT Recent Labs    10/23/18 0341  PROT 6.0*  ALBUMIN 3.5  AST 790*  ALT 538*  ALKPHOS 123  BILITOT 3.9*  BILIDIR 2.1*  IBILI 1.8*     Studies/Results: CT IMPRESSION: 1. Dilation of the gallbladder and common bile duct with high density material within the distal common bile duct which is suspicious for obstructive choledocholithiasis. 2. Diffuse colonic diverticulosis without evidence of diverticulitis.     Jackquline Denmark, MD  10/23/2018, 6:26 PM

## 2018-10-23 NOTE — H&P (View-Only) (Signed)
Consultation  Referring Provider: No ref. provider found Primary Care Physician:  Jonathon Jordan, MD Primary Gastroenterologist:  Dr.  Luiz Iron for Consultation: Abnormal CT    ASSESSMENT/PLAN   85 year old with A. fib on savaysa (last dose 3/20), CKD3, LBBB, AS s/p TAVR with obstructive jaundice, abn LFTs with CT s/o choledocholithiasis with obstruction and early ascending cholangitis. GB still in. Plan: Broad-spectrum antibiotics, trend LFTs, CBC.  Hold Savaysa. ERCP with possible ES/stent insertion tomorrow. CT reviewed independently.    I have discussed risks and benefits in detail with the patient and patient's son [name] in detail including risks of pancreatitis, perforation, bleeding, small risks of dying from the procedure as well.  The benefits were also discussed.  His contact numbers are [phone])    HPI: Alexa Mcdonald is a 85 y.o. female brought in to the ER patient's son complaining of feeling unwell, nausea over the last few days with decreased appetite.  She denies having any nausea or vomiting.  No significant abdominal pain.  No fever or chills.  Was found to have abnormal liver function tests.  Underwent CT scan of the abdomen pelvis which showed choledocholithiasis.  Being admitted for further evaluation.  Has been on savaysa for atrial fibrillation.  Has elevated WBC count despite antibiotics.  Today had bradycardia.  Not taking any beta-blockers.  Son By bedside.   Past Medical History:  Diagnosis Date  . 1st degree AV block   . Bradycardia   . Chronic diastolic heart failure ([place]) 02/07/2016  . CKD (chronic kidney disease), stage III ([place])   . Colon, diverticulosis   . Diabetes mellitus    diet controlled  . Esophageal reflux   . Essential hypertension   . GERD (gastroesophageal reflux disease)   . Hematochezia   . HOH (hard of hearing)    wears bilateral hearing aids  . Hx of orthostatic hypotension   . Interstitial lung disease  ([place])   . LBBB (left bundle branch block)   . Long term current use of anticoagulant therapy   . Mediastinal goiter     recurrent s/p resection 1988  . Mild CAD   . Mitral regurgitation   . Osteopenia   . Osteoporosis   . Paroxysmal atrial fibrillation (HCC)    a. CHA2DS2VASc = 5-->coumadin;  b. previously on amio->d/c'd 11/2014 secondary to concern for possible amio lung.  . Pneumonia   . 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 Severe aortic stenosis   . Stroke ([place])   . Tachycardia-bradycardia ([place])   . Vitamin D deficiency     Past Surgical History:  Procedure Laterality Date  . CARDIAC CATHETERIZATION N/A 10/31/2015   Procedure: Right/Left Heart Cath and Coronary Angiography;  Surgeon: [name], MD;  Location: [place] [facility];  Service: Cardiovascular;  Laterality: N/A;  . CARPAL TUNNEL RELEASE Bilateral   . CATARACT EXTRACTION Bilateral   . CESAREAN SECTION     x3  . mediastinal removal of a goiter    . MRI  08/18/07   head (diabetes heart study at [facility])  . TEE WITHOUT CARDIOVERSION N/A 11/27/2015   Procedure: TRANSESOPHAGEAL ECHOCARDIOGRAM (TEE);  Surgeon: [name], MD;  Location: [place];  Service: Open Heart Surgery;  Laterality: N/A;  . TRANSCATHETER AORTIC VALVE REPLACEMENT, TRANSFEMORAL N/A 11/27/2015   Procedure: TRANSCATHETER AORTIC VALVE REPLACEMENT, TRANSFEMORAL;  Surgeon: [name], MD;  Location: [place];  Service: Open Heart Surgery;  Laterality: N/A;    Prior to Admission medications   Medication Sig Start Date End Date Taking? Authorizing Provider  aspirin EC 81 MG tablet Take 81 mg by mouth daily.   Yes [provider]  atorvastatin (LIPITOR) 10 MG tablet Take 10 mg daily by mouth.   Yes [provider]  b complex vitamins tablet Take 1 tablet by mouth daily.   Yes [provider]  CALCIUM PO  Take 1 tablet by mouth daily.   Yes [provider]  edoxaban (SAVAYSA) 30 MG TABS tablet Take 30 mg by mouth daily.   Yes [provider]  lisinopril (PRINIVIL,ZESTRIL) 10 MG tablet Take 1 tablet (10 mg total) by mouth daily. Please call our office to schedule an appt for future refills (2nd attempt) 08/02/18  Yes Dunn, Dayna N, PA-C  methimazole (TAPAZOLE) 10 MG tablet Take 5 mg by mouth daily. 09/27/18  Yes [provider]  Multiple Vitamins-Minerals (OCUVITE PRESERVISION PO) Take 1 capsule by mouth daily.    Yes [provider]  RESTASIS MULTIDOSE 0.05 % ophthalmic emulsion Apply 1 drop to eye daily. 08/13/17  Yes [provider]  VITAMIN D PO Take 1 tablet by mouth daily.   Yes [provider]  clindamycin (CLEOCIN) 300 MG capsule Take 2 capsules by mouth one hour prior to dental appointment 11/14/16   [name], MD    Current Facility-Administered Medications  Medication Dose Route Frequency Provider Last Rate Last Dose  . cefTRIAXone (ROCEPHIN) 2 g in sodium chloride 0.9 % 100 mL IVPB  2 g Intravenous Q24H Rise Patience, MD 200 mL/hr at 10/23/18 1033 2 g at 10/23/18 1033  . dextrose 5 %-0.9 % sodium chloride infusion   Intravenous Continuous Rise Patience, MD 75 mL/hr at 10/23/18 0200    . hydrALAZINE (APRESOLINE) injection 5 mg  5 mg Intravenous Q4H PRN Rise Patience, MD      . Derrill Memo ON 10/24/2018] Influenza vac split quadrivalent PF (FLUZONE HIGH-DOSE) injection 0.5 mL  0.5 mL Intramuscular Tomorrow-1000 Rise Patience, MD      . metroNIDAZOLE (FLAGYL) IVPB 500 mg  500 mg Intravenous Q8H Rise Patience, MD 100 mL/hr at 10/23/18 1521 500 mg at 10/23/18 1521  . morphine 2 MG/ML injection 0.5 mg  0.5 mg Intravenous Q4H PRN Rise Patience, MD      . ondansetron [facility]) tablet 4 mg  4 mg Oral Q6H PRN Rise Patience, MD       Or  . ondansetron [facility]) injection 4 mg  4 mg Intravenous Q6H PRN  Rise Patience, MD        Allergies as of 10/22/2018 - Review Complete 10/22/2018  Allergen Reaction Noted  . Amoxicillin Other (See Comments) 11/25/2011  . Celebrex [celecoxib] Other (See Comments) 11/25/2011    Family History  Problem Relation Age of Onset  . Diabetes Mother   . Cancer - Prostate Father   . Pulmonary embolism Father   . Hypertension Sister   . Diabetes Sister   . Hypertension Brother   . Arrhythmia Brother        afib  . CAD Brother   . Pulmonary fibrosis Brother     Social History   Socioeconomic History  . Marital status: Widowed    Spouse name: Not on file  . Number of children: 3  . Years of education: Not on file  . Highest education level: Not on file  Occupational History  . Occupation: retire  Social Needs  . Financial resource strain: Not on file  . Food insecurity:    Worry: Not on file    Inability: Not on file  . Transportation needs:    Medical: Not on file    Non-medical: Not on file  Tobacco Use  . Smoking status: Never Smoker  . Smokeless tobacco: Never Used  Substance and Sexual Activity  . Alcohol use: No    Alcohol/week: 0.0 standard drinks  . Drug use: No  . Sexual activity: Not on file  Lifestyle  . Physical activity:    Days per week: Not on file    Minutes per session: Not on file  . Stress: Not on file  Relationships  . Social connections:    Talks on phone: Not on file    Gets together: Not on file    Attends religious service: Not on file    Active member of club or organization: Not on file    Attends meetings of clubs or organizations: Not on file    Relationship status: Not on file  . Intimate partner violence:    Fear of current or ex partner: Not on file    Emotionally abused: Not on file    Physically abused: Not on file    Forced sexual activity: Not on file  Other Topics Concern  . Not on file  Social History Narrative   Lives in [place].  Widowed.    Review of Systems: General:  Denies any fever, chills, night sweats, anorexia, fatigue, weakness, malaise, weight loss.  Skin: Denies rash, itching, dry skin, hives, moles, warts, or unhealing ulcers.  HEENT: Not present - nasal congestion, sinus pain, hoarseness and sore throat. Respiratory: Denies dyspnea at rest, dyspnea with exercise, cough, sputum, wheezing, coughing up blood. Cardiovascular: Denies any chest pain, angina, palpitations, syncope, orthopnea, PND, peripheral edema, and claudication. GU : Denies urinary burning, blood in urine, urinary frequency, urinary hesitancy, nocturnal urination, and urinary incontinence. Musculoskeletal: denies joint pain, extremity pain. Denies muscle weakness, cramps, atrophy.  Neurological:  Denies any headaches, dizziness, paresthesias and seizures. Psych: Denies depression, anxiety or insomnia. No memory loss, suicidal ideation, hallucinations, paranoia, and confusion. Heme: Denies bruising, bleeding, and enlarged lymph nodes or prolonged bleeding.  Physical Exam: Vital signs in last 24 hours: Temp:  [97.7 F (36.5 C)-98.9 F (37.2 C)] 97.7 F (36.5 C) (03/21 1353) Pulse Rate:  [44-86] 44 (03/21 1353) Resp:  [16-27] 19 (03/21 1353) BP: (129-182)/(59-112) 138/59 (03/21 1353) SpO2:  [94 %-98 %] 97 % (03/21 1353) Weight:  [60 kg-60.4 kg] 60.4 kg (03/21 0223) Last BM Date: 10/23/18 General:   Alert,  Well-developed, cooperative, not in acute distress. Head:  Normocephalic and atraumatic. Eyes:  Sclera clear, no icterus.   Conjunctiva pink. Ears:  Normal auditory acuity. Nose:  No deformity, discharge,  or lesions. Mouth:  No deformity or lesions.   Neck:  Supple; no masses or thyromegaly. Lungs:  Clear throughout to auscultation.   No wheezes, crackles, or rhonchi.  Heart:  Regular rate and rhythm; no murmurs, clicks, rubs,  or gallops. Abdomen: Inspection: No visible peristalsis, no abnormal pulsations, skin is normal.  Palpation/percussion : Nontender, no rigidity, no  abnormal dullness to percussion, no hepatosplenomegaly or palpable abdominal masses.  Auscultation: Normal bowel sounds and no abdominal bruits.    Rectal:  Deferred  Extremities:  Without clubbing or edema. Neurologic:  Alert and  oriented x 3;  grossly normal neurologically. Skin:  Intact without significant lesions or rashes.. Psych:  Alert and cooperative. Normal mood and affect.  Intake/Output from previous day: 03/20 0701 - 03/21 0700 In: 777 [I.V.:77; IV Piggyback:700] Out: -  Intake/Output this shift: No intake/output data recorded.  Lab Results: Recent Labs    10/22/18 2036 10/23/18 0341  WBC 17.4* 20.2*  HGB 14.3 12.8  HCT 43.9 41.9  PLT 233 171   BMET Recent Labs    10/22/18 2036 10/23/18 0341  NA 138 137  K 4.2 3.9  CL 102 105  CO2 23 23  GLUCOSE 209* 291*  BUN 27* 27*  CREATININE 1.42* 1.28*  CALCIUM 9.1 8.8*   LFT Recent Labs    10/23/18 0341  PROT 6.0*  ALBUMIN 3.5  AST 790*  ALT 538*  ALKPHOS 123  BILITOT 3.9*  BILIDIR 2.1*  IBILI 1.8*     Studies/Results: CT IMPRESSION: 1. Dilation of the gallbladder and common bile duct with high density material within the distal common bile duct which is suspicious for obstructive choledocholithiasis. 2. Diffuse colonic diverticulosis without evidence of diverticulitis.     Jackquline Denmark, MD  10/23/2018, 6:26 PM

## 2018-10-24 ENCOUNTER — Encounter (HOSPITAL_COMMUNITY)
Admission: EM | Disposition: A | Discharge status: Home Health | Payer: Self-pay | Source: Home / Self Care | Attending: Internal Medicine

## 2018-10-24 ENCOUNTER — Inpatient Hospital Stay (HOSPITAL_COMMUNITY): Payer: Medicare Other

## 2018-10-24 ENCOUNTER — Inpatient Hospital Stay (HOSPITAL_COMMUNITY): Payer: Medicare Other | Admitting: Anesthesiology

## 2018-10-24 ENCOUNTER — Encounter (HOSPITAL_COMMUNITY): Payer: Self-pay | Admitting: *Deleted

## 2018-10-24 HISTORY — PX: SPHINCTEROTOMY: SHX5544

## 2018-10-24 HISTORY — PX: BILIARY DILATION: SHX6850

## 2018-10-24 HISTORY — PX: ERCP: SHX5425

## 2018-10-24 HISTORY — PX: REMOVAL OF STONES: SHX5545

## 2018-10-24 LAB — CBC WITH DIFFERENTIAL/PLATELET
Abs Immature Granulocytes: 0.1 10*3/uL — ABNORMAL HIGH (ref 0.00–0.07)
Basophils Absolute: 0 10*3/uL (ref 0.0–0.1)
Basophils Relative: 0 %
Eosinophils Absolute: 0.1 10*3/uL (ref 0.0–0.5)
Eosinophils Relative: 1 %
HCT: 37 % (ref 36.0–46.0)
Hemoglobin: 12 g/dL (ref 12.0–15.0)
Immature Granulocytes: 1 %
Lymphocytes Relative: 4 %
Lymphs Abs: 0.6 10*3/uL — ABNORMAL LOW (ref 0.7–4.0)
MCH: 30.2 pg (ref 26.0–34.0)
MCHC: 32.4 g/dL (ref 30.0–36.0)
MCV: 93.2 fL (ref 80.0–100.0)
Monocytes Absolute: 0.8 10*3/uL (ref 0.1–1.0)
Monocytes Relative: 6 %
Neutro Abs: 11.3 10*3/uL — ABNORMAL HIGH (ref 1.7–7.7)
Neutrophils Relative %: 88 %
Platelets: 156 10*3/uL (ref 150–400)
RBC: 3.97 MIL/uL (ref 3.87–5.11)
RDW: 15.2 % (ref 11.5–15.5)
WBC: 12.9 10*3/uL — ABNORMAL HIGH (ref 4.0–10.5)
nRBC: 0 % (ref 0.0–0.2)

## 2018-10-24 LAB — COMPREHENSIVE METABOLIC PANEL
ALT: 436 U/L — ABNORMAL HIGH (ref 0–44)
AST: 305 U/L — ABNORMAL HIGH (ref 15–41)
Albumin: 3.2 g/dL — ABNORMAL LOW (ref 3.5–5.0)
Alkaline Phosphatase: 125 U/L (ref 38–126)
Anion gap: 8 (ref 5–15)
BUN: 29 mg/dL — ABNORMAL HIGH (ref 8–23)
CO2: 21 mmol/L — ABNORMAL LOW (ref 22–32)
Calcium: 8.2 mg/dL — ABNORMAL LOW (ref 8.9–10.3)
Chloride: 108 mmol/L (ref 98–111)
Creatinine, Ser: 1.34 mg/dL — ABNORMAL HIGH (ref 0.44–1.00)
GFR calc Af Amer: 39 mL/min — ABNORMAL LOW (ref >60)
GFR calc non Af Amer: 34 mL/min — ABNORMAL LOW (ref >60)
Glucose, Bld: 157 mg/dL — ABNORMAL HIGH (ref 70–99)
Potassium: 3.8 mmol/L (ref 3.5–5.1)
Sodium: 137 mmol/L (ref 135–145)
Total Bilirubin: 3.6 mg/dL — ABNORMAL HIGH (ref 0.3–1.2)
Total Protein: 5.9 g/dL — ABNORMAL LOW (ref 6.5–8.1)

## 2018-10-24 LAB — GLUCOSE, CAPILLARY: Glucose-Capillary: 149 mg/dL — ABNORMAL HIGH (ref 70–99)

## 2018-10-24 IMAGING — RF ERCP
1 series · 7 of 7 positions shown · non-contrast
Comparison: CT [DATE]

CLINICAL DATA: Cholangitis, probable choledocholithiasis

EXAM:
ERCP
TECHNIQUE: Multiple spot images obtained with the fluoroscopic device and
submitted for interpretation post-procedure.

[Series 1: run · 7 of 7 slices shown]
[im 1/7]
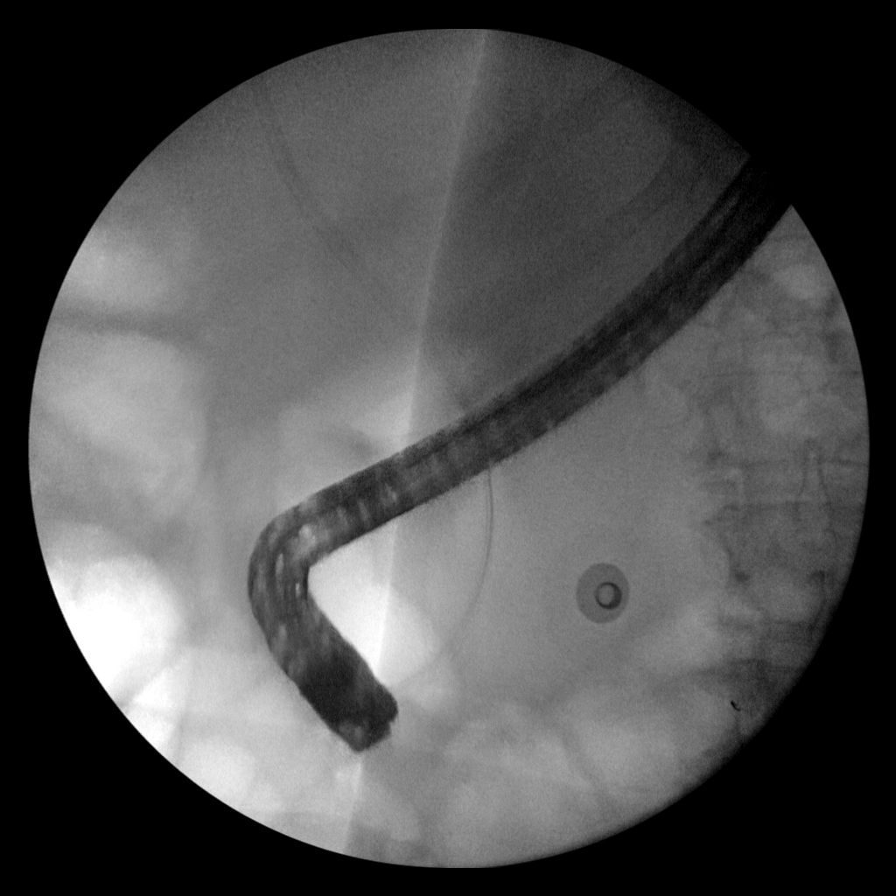
[im 2/7]
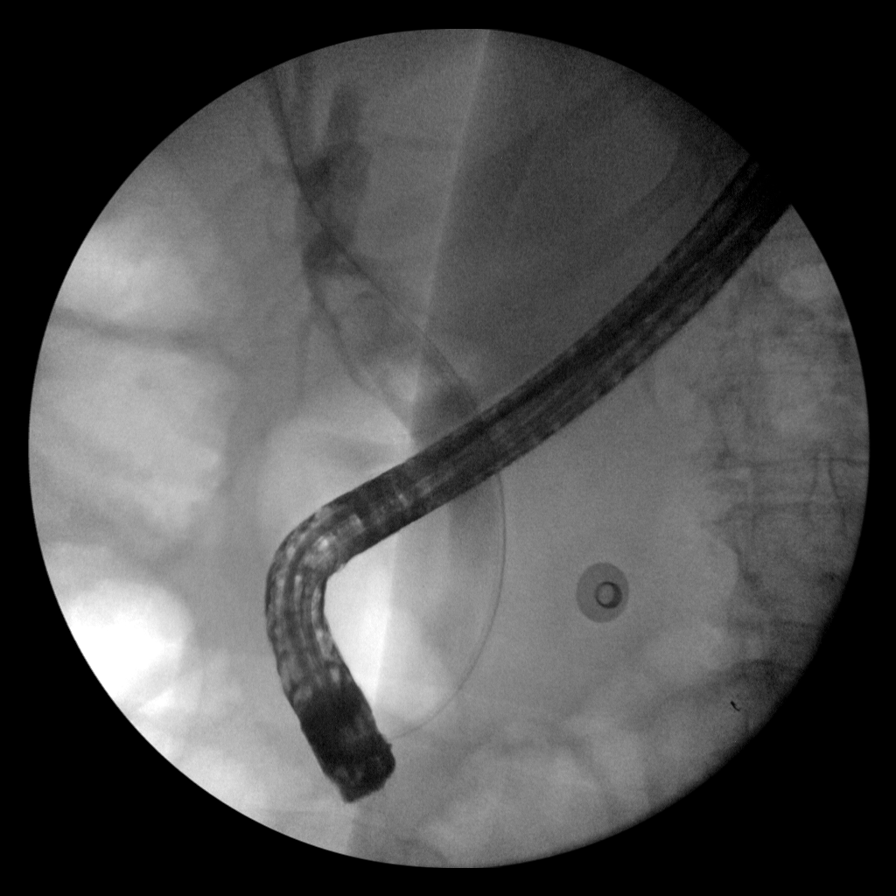
[im 3/7]
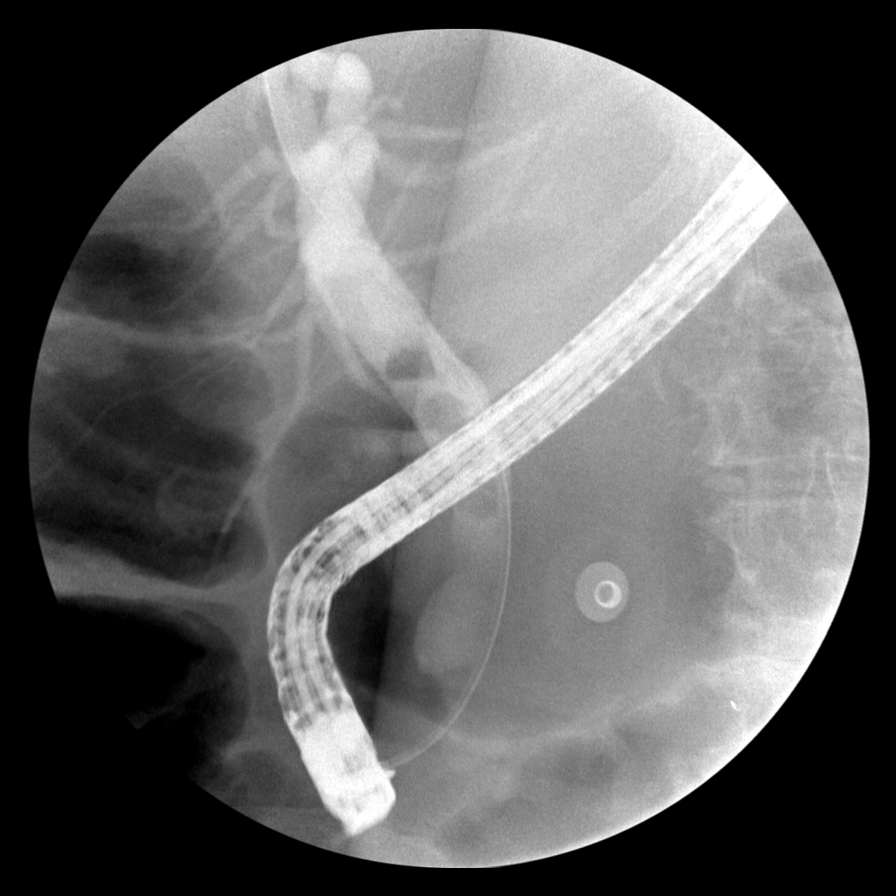
[im 4/7]
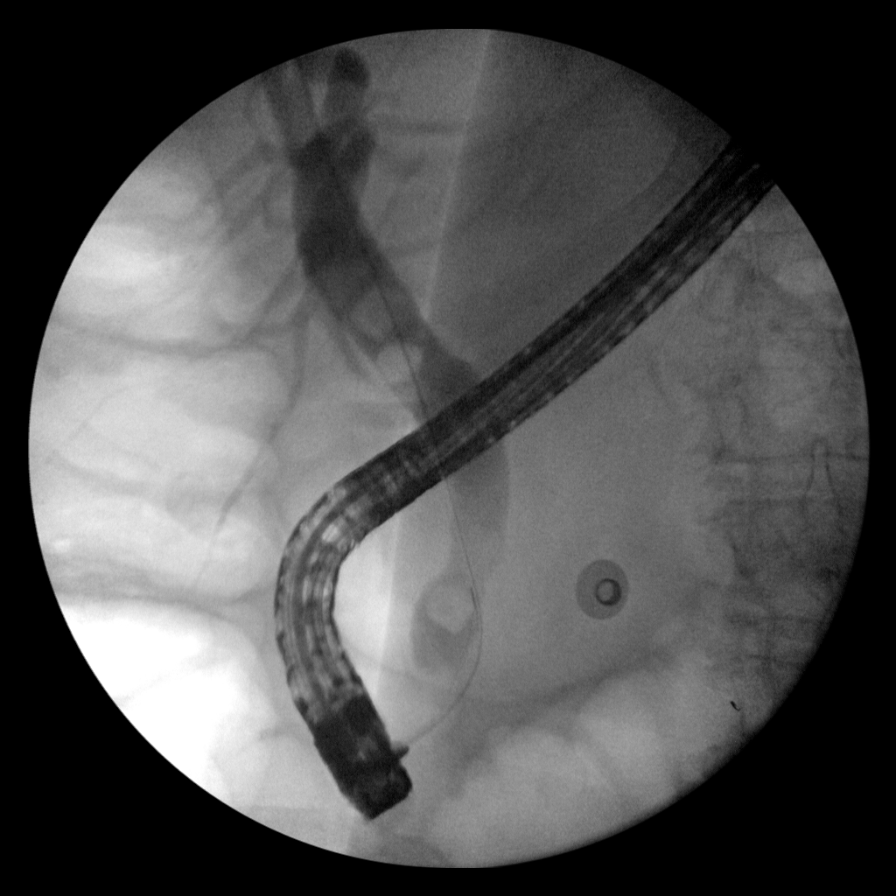
[im 5/7]
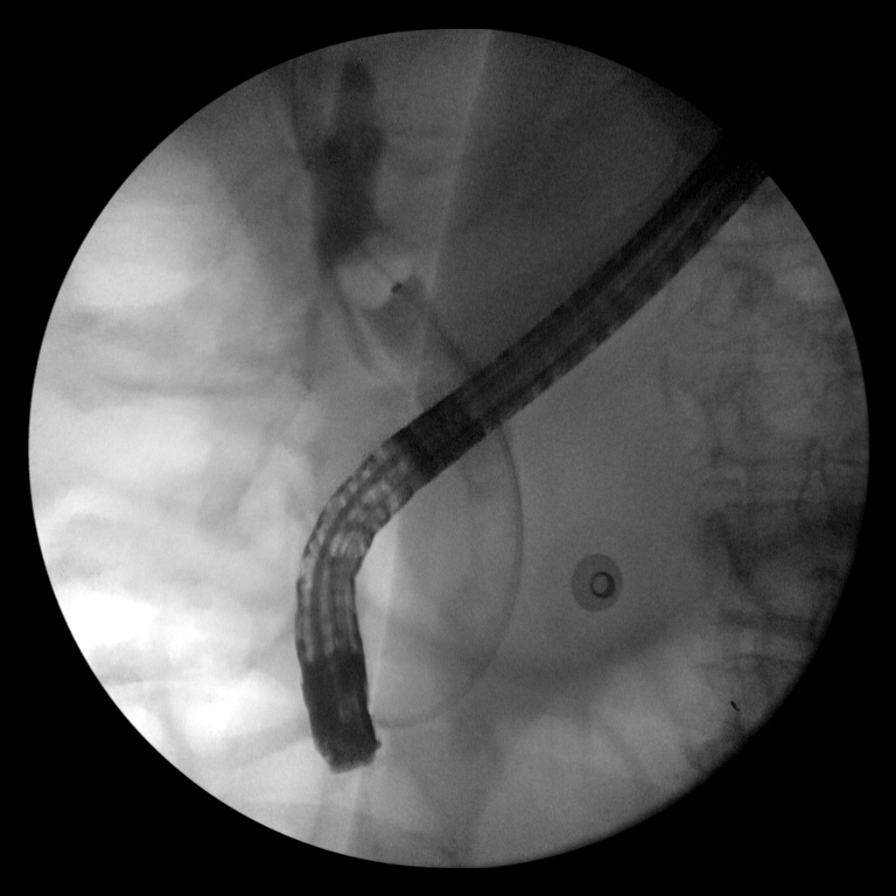
[im 6/7]
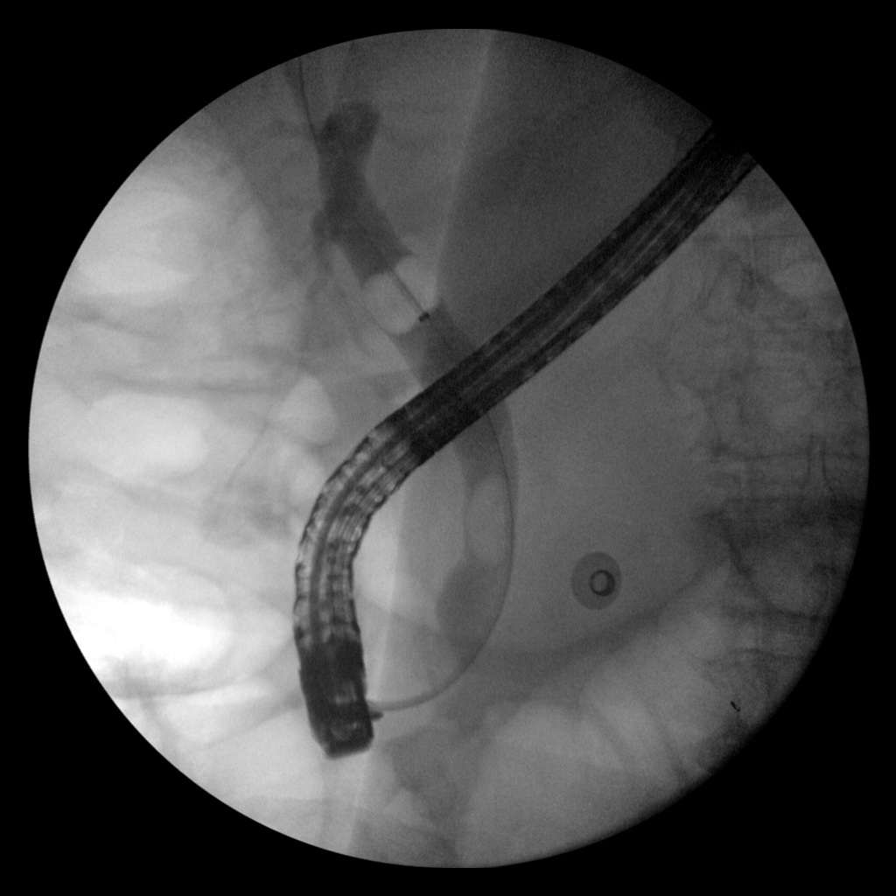
[im 7/7]
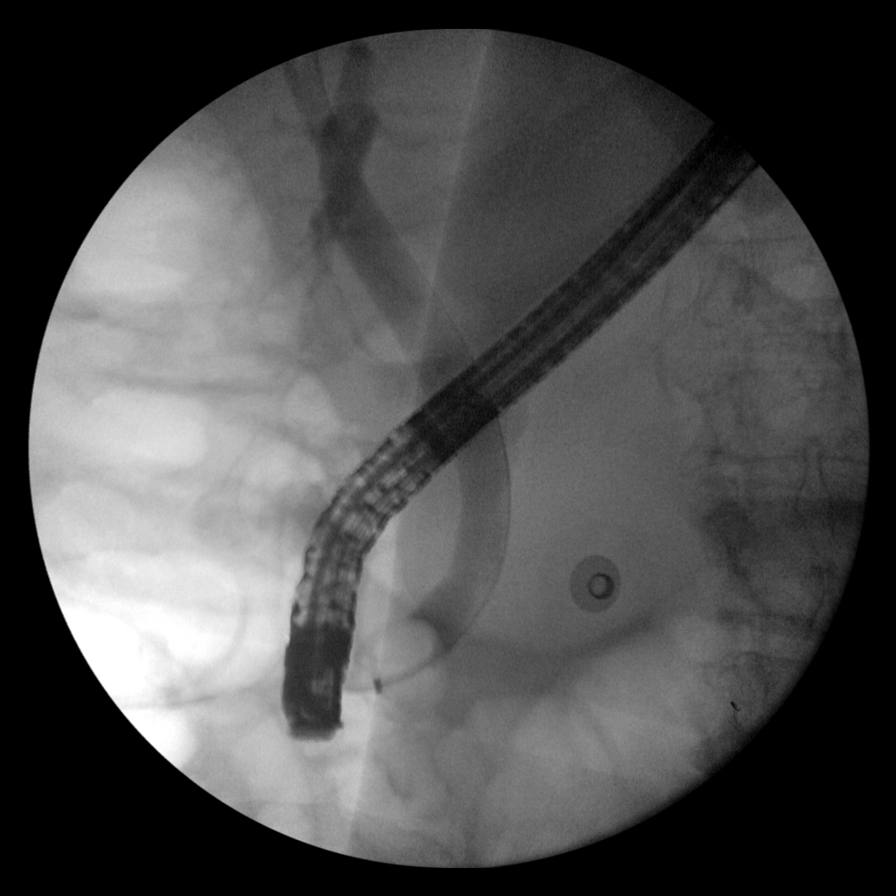

[7 of 7 positions shown; findings below may reference images not displayed]

FINDINGS: A series of fluoroscopic spot images document endoscopic cannulation
and opacification of the CBD. Filling defects within the proximal
CBD which is distended. Incomplete opacification of the biliary
tree, which appears mildly distended centrally. Subsequent images
document balloon catheter passage through the CBD with apparent
clearance of filling defects. No evidence of extravasation.
IMPRESSION: Endoscopic CBD cannulation and intervention.

These images were submitted for radiologic interpretation only.
Please see the procedural report for the amount of contrast and the
fluoroscopy time utilized.

## 2018-10-24 SURGERY — ERCP, WITH INTERVENTION IF INDICATED
Anesthesia: General

## 2018-10-24 MED ORDER — SODIUM CHLORIDE 0.9 % IV SOLN
INTRAVENOUS | Status: DC | PRN
  Administered 2018-10-24: 50 mL

## 2018-10-24 MED ORDER — CHLORHEXIDINE GLUCONATE CLOTH 2 % EX PADS
6.0000 | MEDICATED_PAD | Freq: Every day | CUTANEOUS | Status: DC
  Administered 2018-10-24 – 2018-10-25 (×2): 6 via TOPICAL

## 2018-10-24 MED ORDER — ROCURONIUM BROMIDE 10 MG/ML (PF) SYRINGE
PREFILLED_SYRINGE | INTRAVENOUS | Status: DC | PRN
  Administered 2018-10-24: 50 mg via INTRAVENOUS

## 2018-10-24 MED ORDER — SODIUM CHLORIDE 0.9 % IV SOLN
INTRAVENOUS | Status: DC | PRN
  Administered 2018-10-24: 10 ug/min via INTRAVENOUS

## 2018-10-24 MED ORDER — FENTANYL CITRATE (PF) 100 MCG/2ML IJ SOLN
INTRAMUSCULAR | Status: DC | PRN
  Administered 2018-10-24: 50 ug via INTRAVENOUS

## 2018-10-24 MED ORDER — PROMETHAZINE HCL 25 MG/ML IJ SOLN: 6.2500 mg | INTRAMUSCULAR | Status: DC | PRN

## 2018-10-24 MED ORDER — GLUCAGON HCL RDNA (DIAGNOSTIC) 1 MG IJ SOLR
INTRAMUSCULAR | Status: AC
  Filled 2018-10-24: qty 1

## 2018-10-24 MED ORDER — DEXTROSE-NACL 5-0.9 % IV SOLN
INTRAVENOUS | Status: AC
  Administered 2018-10-24: 22:00:00 via INTRAVENOUS

## 2018-10-24 MED ORDER — PROPOFOL 10 MG/ML IV BOLUS
INTRAVENOUS | Status: AC
  Filled 2018-10-24: qty 20

## 2018-10-24 MED ORDER — SUGAMMADEX SODIUM 500 MG/5ML IV SOLN
INTRAVENOUS | Status: DC | PRN
  Administered 2018-10-24: 200 mg via INTRAVENOUS

## 2018-10-24 MED ORDER — GLUCAGON HCL RDNA (DIAGNOSTIC) 1 MG IJ SOLR
INTRAMUSCULAR | Status: DC | PRN
  Administered 2018-10-24 (×2): .2 mg via INTRAVENOUS
  Administered 2018-10-24: 0.25 mg via INTRAVENOUS

## 2018-10-24 MED ORDER — ONDANSETRON HCL 4 MG/2ML IJ SOLN
INTRAMUSCULAR | Status: DC | PRN
  Administered 2018-10-24: 4 mg via INTRAVENOUS

## 2018-10-24 MED ORDER — INDOMETHACIN 50 MG RE SUPP
RECTAL | Status: DC | PRN
  Administered 2018-10-24: 100 mg via RECTAL

## 2018-10-24 MED ORDER — FENTANYL CITRATE (PF) 100 MCG/2ML IJ SOLN
INTRAMUSCULAR | Status: AC
  Filled 2018-10-24: qty 2

## 2018-10-24 MED ORDER — INDOMETHACIN 50 MG RE SUPP
100.0000 mg | Freq: Once | RECTAL | Status: DC
  Filled 2018-10-24: qty 2

## 2018-10-24 MED ORDER — LIDOCAINE 2% (20 MG/ML) 5 ML SYRINGE
INTRAMUSCULAR | Status: DC | PRN
  Administered 2018-10-24: 40 mg via INTRAVENOUS

## 2018-10-24 MED ORDER — PROPOFOL 10 MG/ML IV BOLUS
INTRAVENOUS | Status: DC | PRN
  Administered 2018-10-24: 100 mg via INTRAVENOUS

## 2018-10-24 MED ORDER — INDOMETHACIN 50 MG RE SUPP
RECTAL | Status: AC
  Filled 2018-10-24: qty 2

## 2018-10-24 NOTE — Op Note (Signed)
[facility] Patient Name: Alexa Mcdonald Procedure Date: 10/24/2018 MRN: [id number] Attending MD: Jackquline Denmark , MD Date of Birth: 1921/05/02 CSN: [id number] Age: 85 Admit Type: Inpatient Procedure:                ERCP Indications:              Bile duct stone(s) with ascending cholangitis Providers:                Jackquline Denmark, MD, Elna Breslow, RN, William Dalton, Technician Referring MD:              Medicines:                General Anesthesia Complications:            No immediate complications. Estimated Blood Loss:     Estimated blood loss: none. Procedure:                Pre-Anesthesia Assessment:                           - Prior to the procedure, a History and Physical                            was performed, and patient medications and                            allergies were reviewed. The patient's tolerance of                            previous anesthesia was also reviewed. The risks                            and benefits of the procedure and the sedation                            options and risks were discussed with the patient.                            All questions were answered, and informed consent                            was obtained. Prior Anticoagulants: The patient has                            taken Savaysa, last dose was 2 days prior to                            procedure. ASA Grade Assessment: III - A patient                            with severe systemic disease. After reviewing the  risks and benefits, the patient was deemed in                            satisfactory condition to undergo the procedure.                           After obtaining informed consent, the scope was                            passed under direct vision. Throughout the                            procedure, the patient's blood pressure, pulse, and                            oxygen saturations were  monitored continuously. The                            [id number]-[id number] ([id number] Olympus duodenoscope was                            introduced through the mouth, and used to inject                            contrast into and used to inject contrast into the                            bile duct. The ERCP was accomplished without                            difficulty. The patient tolerated the procedure                            well. Scope In: Scope Out: Findings:      The scout film was normal. The major papilla was on the rim of a       diverticulum. The major papilla was normal. The bile duct was deeply       cannulated with the short-nosed traction sphincterotome. Contrast was       injected. I personally interpreted the bile duct images. The main bile       duct contained four stones, the largest of which was 12 mm in diameter.       The main bile duct and entire biliary tree were moderately dilated, with       a stone causing an obstruction. The largest diameter was 15 mm. A 8 mm       biliary sphincterotomy was made with a braided traction (standard)       sphincterotome using ERBE electrocautery. There was no       post-sphincterotomy bleeding. Sphincteroplasty was performed with 12 mm       CRE balloon x 1 min. No significant bleeding. The biliary tree was swept       with a 15 mm balloon starting at the bifurcation. Four stones were       removed. Multiple sweeps were performed. Endoscopic photodocumentation  is obtained. No stones remained on postocclusion cholangiogram. Cystic       duct was patent. There was cholelithiasis. There was no pus. Impression:               -Choledocholithiasis status post biliary                            sphincterotomy, sphincteroplasty and stone                            extraction.                           -Cholelithiasis. Moderate Sedation:      Not Applicable - Patient had care per Anesthesia. Recommendation:           - Return patient  to hospital ward for ongoing care.                           - Recommend surgical consultation for lap chole.                           - Resume savaysa at prior dose in 3 days.                           - Watch for pancreatitis, bleeding, perforation,                            and cholangitis.                           - Clear liquid diet. Procedure Code(s):        --- Professional ---                           [phone], 103, Endoscopic retrograde                            cholangiopancreatography (ERCP); with                            trans-endoscopic balloon dilation of                            biliary/pancreatic duct(s) or of ampulla                            (sphincteroplasty), including sphincterotomy, when                            performed, each duct                           [id number], Endoscopic retrograde                            cholangiopancreatography (ERCP); with removal of  calculi/debris from biliary/pancreatic duct(s)                           [phone], Endoscopic catheterization of the biliary                            ductal system, radiological supervision and                            interpretation Diagnosis Code(s):        --- Professional ---                           K80.51, Calculus of bile duct without cholangitis                            or cholecystitis with obstruction CPT copyright 2018 American [employer] Association. All rights reserved. The codes documented in this report are preliminary and upon coder review may  be revised to meet current compliance requirements. Jackquline Denmark, MD 10/24/2018 2:27:14 PM This report has been signed electronically. Number of Addenda: 0

## 2018-10-24 NOTE — Anesthesia Postprocedure Evaluation (Signed)
Anesthesia Post Note  Patient: Alexa Mcdonald  Procedure(s) Performed: ENDOSCOPIC RETROGRADE CHOLANGIOPANCREATOGRAPHY (ERCP) (N/A ) SPHINCTEROTOMY BILIARY DILATION REMOVAL OF STONES     Patient location during evaluation: PACU Anesthesia Type: General Level of consciousness: awake and alert Pain management: pain level controlled Vital Signs Assessment: post-procedure vital signs reviewed and stable Respiratory status: spontaneous breathing, nonlabored ventilation and respiratory function stable Cardiovascular status: blood pressure returned to baseline and stable Postop Assessment: no apparent nausea or vomiting Anesthetic complications: no    Last Vitals:  Vitals:   10/24/18 1500 10/24/18 1515  BP: (!) 164/75   Pulse: 62 (!) 53  Resp: 16 16  Temp:    SpO2: 95% 95%    Last Pain:  Vitals:   10/24/18 1500  TempSrc:   PainSc: 0-No pain                 Brennan Bailey

## 2018-10-24 NOTE — Progress Notes (Signed)
PROGRESS NOTE  Alexa Mcdonald PPJ:[id number] DOB: 1921/07/27 DOA: 10/22/2018 PCP: Jonathon Jordan, MD  Brief History   The patient is a 85 yr old woman who presented to the ED with a complaint of nausea and not feeling well for a few days. She was found to have an elvated creatinine,, AST, ALT, and bilirubin. WBC was 17.4. CT was concerning for choledocholithiasis.   The patient has been admitted to a medical bed. GI was consulted. She has been started on cefepime and flagyl. Blood cultures have been obtained. No growth thus far. The patient will undergo ERCP today.  Consultants  . Gastroenterology   Procedures  . None  Antibiotics  . Cefepime and Flagyl  Interval History/Subjective  See above  Objective   Vitals:  Vitals:   10/24/18 0523 10/24/18 1255  BP: (!) 169/68 (!) 198/64  Pulse: 82 (!) 54  Resp: 18 20  Temp: 99.8 F (37.7 C) 98.3 F (36.8 C)  SpO2: 95% 97%    Exam:  Constitutional:  . The patient is elderly, weak, and frail. She is awake, alert, and oriented x 3. No acute distress. Respiratory:  . No wheezes, rales, or rhonchi. . No tactile fremitus . No increased work of breathing. No wheezes, rales, or rhonchi. Cardiovascular:  . Regular rate and rhythm.  . No murmurs, ectopy, or gallups . No lateral PMI. No thrills . Normal pedal pulses Abdomen:  . Abdomen is soft, non-tender, non-distended. . No hernias, masses, or organomegaly are appreciated. [name] Hypoactive bowel sounds. Musculoskeletal:  . NO cyanosis, clubbing or edema Skin:  . No rashes, lesions, ulcers . palpation of skin: no induration or nodules Neurologic:  . CN 2-12 intact . Moving all extremities. Psychiatric:  . Mental status o Mood, affect appropriate  . judgment and insight appear intact     I have personally reviewed the following:   Today's Data  . CMP, CBC, Vitals  Scheduled Meds: . [MAR Hold] Chlorhexidine Gluconate Cloth  6 each Topical Q0600  . [MAR Hold]  indomethacin  100 mg Rectal Once  . [MAR Hold] Influenza vac split quadrivalent PF  0.5 mL Intramuscular Tomorrow-1000   Continuous Infusions: . sodium chloride 20 mL/hr at 10/23/18 2344  . [MAR Hold] cefTRIAXone (ROCEPHIN)  IV 2 g (10/24/18 1011)  . [MAR Hold] metronidazole 500 mg (10/24/18 0825)    Principal Problem:   Choledocholithiasis with obstruction Active Problems:   Essential hypertension   CKD (chronic kidney disease), stage III (HCC)   Interstitial lung disease (HCC)   S/P TAVR (transcatheter aortic valve replacement)   Paroxysmal atrial fibrillation (HCC)   A & P  Choledocholithiasis with obstruction: AST up to 790. ALT is now 538. Direct bilirubin is 2.1. Indirect is 1.8. T bili 3.9. up from 2.6. WBC is 20.2. GI is consulted. We appreciate their help. She is receiving IV Rocephin and Flagyl.  CKD Stage III: Creatinine 1.34 down from 1.57.   Essential Hypertension: Well controlled on IV hydralazine as needed.   S/P TAVR: Noted. Savaysa held for presumed procedures.  Interstitial Lung Disease: Noted. The patient is saturating 97% on room air.  Paroxysmal Atrial Fibrillation: Rate is controlled. [facility] Cancer is held for ERCP.  DVT prophylaxis: SCD's Code Status: Full Code Family Communication: No family is available Disposition Plan: home.  Magdala Brahmbhatt, DO [facility] Direct contact: see [email or web address]  7PM-7AM contact night coverage as above 10/23/2018, 2:50 PM  LOS: 0 days    LOS: 1 day

## 2018-10-24 NOTE — Anesthesia Preprocedure Evaluation (Addendum)
Anesthesia Evaluation  Patient identified by MRN, date of birth, ID band Patient awake    Reviewed: Allergy & Precautions, NPO status , Patient's Chart, lab work & pertinent test results  History of Anesthesia Complications Negative for: history of anesthetic complications  Airway Mallampati: II  TM Distance: >3 FB Neck ROM: Full    Dental  (+) Dental Advisory Given, Partial Upper   Pulmonary neg pulmonary ROS,    Pulmonary exam normal breath sounds clear to auscultation       Cardiovascular hypertension, Pt. on medications + CAD  Normal cardiovascular exam+ dysrhythmias Atrial Fibrillation  Rhythm:Irregular Rate:Normal  TTE 2018: EF 11-17%, grade 1 diastolic dysfunction, s/p TAVR (2017) with mild AR, severe LAE, mild TR, PASP 56mmHg     LBBB    Neuro/Psych CVA    GI/Hepatic GERD  Controlled,  Endo/Other  diabetes, Type 2Hyperthyroidism   Renal/GU Renal InsufficiencyRenal disease     Musculoskeletal   Abdominal   Peds  Hematology   Anesthesia Other Findings   Reproductive/Obstetrics                           Anesthesia Physical Anesthesia Plan  ASA: III and emergent  Anesthesia Plan: General   Post-op Pain Management:    Induction: Intravenous  PONV Risk Score and Plan: 3 and Treatment may vary due to age or medical condition and Ondansetron  Airway Management Planned: Oral ETT  Additional Equipment:   Intra-op Plan:   Post-operative Plan: Extubation in OR  Informed Consent: I have reviewed the patients History and Physical, chart, labs and discussed the procedure including the risks, benefits and alternatives for the proposed anesthesia with the patient or authorized representative who has indicated his/her understanding and acceptance.     Dental advisory given  Plan Discussed with: CRNA  Anesthesia Plan Comments:        Anesthesia Quick Evaluation

## 2018-10-24 NOTE — Anesthesia Procedure Notes (Signed)
Date/Time: 10/24/2018 2:20 PM Performed by: Cynda Familia, CRNA Oxygen Delivery Method: Simple face mask Placement Confirmation: positive ETCO2 and breath sounds checked- equal and bilateral Dental Injury: Teeth and Oropharynx as per pre-operative assessment

## 2018-10-24 NOTE — Anesthesia Procedure Notes (Signed)
Procedure Name: Intubation Date/Time: 10/24/2018 1:34 PM Performed by: Cynda Familia, CRNA Pre-anesthesia Checklist: Patient identified, Emergency Drugs available, Suction available and Patient being monitored Patient Re-evaluated:Patient Re-evaluated prior to induction Oxygen Delivery Method: Circle System Utilized Preoxygenation: Pre-oxygenation with 100% oxygen Induction Type: IV induction Ventilation: Mask ventilation without difficulty Laryngoscope Size: Miller and 2 Grade View: Grade I Tube type: Oral Tube size: 7.0 mm Number of attempts: 1 Airway Equipment and Method: Stylet Placement Confirmation: ETT inserted through vocal cords under direct vision,  positive ETCO2 and breath sounds checked- equal and bilateral Secured at: 21 cm Tube secured with: Tape Dental Injury: Teeth and Oropharynx as per pre-operative assessment  Comments: Smooth IV induction Howze-- intubation AM CRNA atraumatic-- teeth and mouth as preop-- bilat BS Howze--many missing teeth preop-- unchanged with laryngoscopy

## 2018-10-24 NOTE — Transfer of Care (Signed)
Immediate Anesthesia Transfer of Care Note  Patient: Alexa Mcdonald  Procedure(s) Performed: ENDOSCOPIC RETROGRADE CHOLANGIOPANCREATOGRAPHY (ERCP) (N/A ) SPHINCTEROTOMY BILIARY DILATION REMOVAL OF STONES  Patient Location: PACU  Anesthesia Type:General  Level of Consciousness: sedated  Airway & Oxygen Therapy: Patient Spontanous Breathing and Patient connected to face mask oxygen  Post-op Assessment: Report given to RN and Post -op Vital signs reviewed and stable  Post vital signs: Reviewed and stable  Last Vitals:  Vitals Value Taken Time  BP    Temp    Pulse    Resp    SpO2      Last Pain:  Vitals:   10/24/18 1255  TempSrc: Oral  PainSc: 0-No pain      Patients Stated Pain Goal: 2 (06/06/14 9458)  Complications: No apparent anesthesia complications

## 2018-10-24 NOTE — Interval H&P Note (Signed)
History and Physical Interval Note:  10/24/2018 1:06 PM  Alexa Mcdonald  has presented today for surgery, with the diagnosis of Obstructive jaundice with ascending cholangitis.  The various methods of treatment have been discussed with the patient and family. After consideration of risks, benefits and other options for treatment, the patient has consented to  Procedure(s): ENDOSCOPIC RETROGRADE CHOLANGIOPANCREATOGRAPHY (ERCP) (N/A) as a surgical intervention.  The patient's history has been reviewed, patient examined, no change in status, stable for surgery.  I have reviewed the patient's chart and labs.  Questions were answered to the patient's satisfaction.     Jackquline Denmark

## 2018-10-24 NOTE — Consult Note (Addendum)
Re:   NIANNA IGO DOB:   [birth year] MRN:   [id number]  Chief Complaint Abdominal pain  ASSESEMENT AND PLAN: 1.  Cholelithiasis  The patient was sedated. I discusseed the indications and risks of gall bladder surgery with her son by phone.  The primary risks of gall bladder surgery include, but are not limited to, bleeding, infection, common bile duct injury, and open surgery.  There is also the risk that the patient may have continued symptoms after surgery.  We discussed the typical post-operative recovery course. I tried to answer the patient's questions.  I spent 15 minutes talking to her son, [name], on the phone about the options of observation vs going ahead with surgery.  We will tentatively plan for surgery tomorrow.  Dr. Kieth Brightly is the surgeon of the week and I will discuss her case with him in the AM.  2.  CBD stones  ERCP today - Dr. Arelia Sneddon  I discussed the findings with Dr. Lyndel Safe by phone. 3.  DM 4.  Atrial fibrillation  Chronic LBBB 5.  TAVR - aortic valve  Drs. Owen/Cooper - 11/27/2015 6.  CKD  Creatinine - 1.34 - 10/24/2018 7.  Anticoagulated  On edoxaban, though off right now 8.  History of thyroidectomy - 1988  Chief Complaint  Patient presents with  . Emesis   PHYSICIAN REQUESTING CONSULTATION:  Dr. Karie Kirks, Hospitalist  HISTORY OF PRESENT ILLNESS: LADY WISHAM is a 85 y.o. (DOB: 06-25-1921) white female whose primary care physician is Jonathon Jordan, MD.   She is by herself.  She just had an ERCP and was returned from the recovery room to her room.  There is no family in the room.  She was seen by Dr. Lyndel Safe and found to be jaundiced with elevated liver function test.  Apparently she has had some nausea and vague GI symptoms for a few days.  But because of her sedation, I could not take a GI history from her.   She had an abdominal CT scan on 10/22/2018 - 1. Dilation of the gallbladder and common bile duct with high density material within  the distal common bile duct which is suspicious for obstructive choledocholithiasis.   2. Diffuse colonic diverticulosis without evidence of diverticulitis.      Past Medical History:  Diagnosis Date  . 1st degree AV block   . Bradycardia   . Chronic diastolic heart failure ([place]) 02/07/2016  . CKD (chronic kidney disease), stage III ([place])   . Colon, diverticulosis   . Diabetes mellitus    diet controlled  . Esophageal reflux   . Essential hypertension   . GERD (gastroesophageal reflux disease)   . Hematochezia   . HOH (hard of hearing)    wears bilateral hearing aids  . Hx of orthostatic hypotension   . Interstitial lung disease ([place])   . LBBB (left bundle branch block)   . Long term current use of anticoagulant therapy   . Mediastinal goiter     recurrent s/p resection 1988  . Mild CAD   . Mitral regurgitation   . Osteopenia   . Osteoporosis   . Paroxysmal atrial fibrillation ([facility])    a. CHA2DS2VASc = 5-->coumadin;  b. previously on amio->d/c'd 11/2014 secondary to concern for possible amio lung.  . Pneumonia   . 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 Severe aortic stenosis   .  Stroke ([place])   . Tachycardia-bradycardia ([place])   . Vitamin D deficiency       Past Surgical History:  Procedure Laterality Date  . CARDIAC CATHETERIZATION N/A 10/31/2015   Procedure: Right/Left Heart Cath and Coronary Angiography;  Surgeon: [name], MD;  Location: [facility];  Service: Cardiovascular;  Laterality: N/A;  . CARPAL TUNNEL RELEASE Bilateral   . CATARACT EXTRACTION Bilateral   . CESAREAN SECTION     x3  . mediastinal removal of a goiter    . MRI  08/18/07   head (diabetes heart study at [facility])  . TEE WITHOUT CARDIOVERSION N/A 11/27/2015   Procedure: TRANSESOPHAGEAL ECHOCARDIOGRAM (TEE);  Surgeon: [name], MD;  Location: [facility];  Service:  Open Heart Surgery;  Laterality: N/A;  . TRANSCATHETER AORTIC VALVE REPLACEMENT, TRANSFEMORAL N/A 11/27/2015   Procedure: TRANSCATHETER AORTIC VALVE REPLACEMENT, TRANSFEMORAL;  Surgeon: [name], MD;  Location: [facility];  Service: Open Heart Surgery;  Laterality: N/A;      Current Facility-Administered Medications  Medication Dose Route Frequency Provider Last Rate Last Dose  . cefTRIAXone (ROCEPHIN) 2 g in sodium chloride 0.9 % 100 mL IVPB  2 g Intravenous Q24H Rise Patience, MD 200 mL/hr at 10/24/18 1011 2 g at 10/24/18 1011  . Chlorhexidine Gluconate Cloth 2 % PADS 6 each  6 each Topical Q0600 Swayze, Ava, DO   6 each at 10/24/18 0502  . hydrALAZINE (APRESOLINE) injection 5 mg  5 mg Intravenous Q4H PRN Rise Patience, MD      . indomethacin (INDOCIN) 50 MG suppository 100 mg  100 mg Rectal Once Jackquline Denmark, MD      . Influenza vac split quadrivalent PF (FLUZONE HIGH-DOSE) injection 0.5 mL  0.5 mL Intramuscular Tomorrow-1000 Rise Patience, MD      . metroNIDAZOLE (FLAGYL) IVPB 500 mg  500 mg Intravenous Q8H Rise Patience, MD 100 mL/hr at 10/24/18 0825 500 mg at 10/24/18 0825  . morphine 2 MG/ML injection 0.5 mg  0.5 mg Intravenous Q4H PRN Rise Patience, MD      . ondansetron [facility]) tablet 4 mg  4 mg Oral Q6H PRN Rise Patience, MD       Or  . ondansetron [facility])) injection 4 mg  4 mg Intravenous Q6H PRN Rise Patience, MD      . promethazine (PHENERGAN) injection 6.25-12.5 mg  6.25-12.5 mg Intravenous Q15 min PRN Brennan Bailey, MD          Allergies  Allergen Reactions  . Amoxicillin Other (See Comments)    tongue felt bruised Did it involve swelling of the face/tongue/throat, SOB, or low BP? No Did it involve sudden or severe rash/hives, skin peeling, or any reaction on the inside of your mouth or nose? No Did you need to seek medical attention at a hospital or doctor's office? No When did it last happen?childhood If all  above answers are "NO", may proceed with cephalosporin use.    . Celebrex [Celecoxib] Other (See Comments)    Mouth sores    REVIEW OF SYSTEMS: Skin:  No history of rash.  No history of abnormal moles. Infection:  No history of hepatitis or HIV.  No history of MRSA. Neurologic:  No history of stroke.  No history of seizure.  No history of headaches. Cardiac: Atrial fibrillation,    TAVR - aortic valve - Drs. Owen/Cooper - 11/27/2015 Pulmonary:  Does not smoke cigarettes.  No asthma or bronchitis.  No OSA/CPAP.  Endocrine:  DM  No thyroid disease. Gastrointestinal:  See HPI Urologic:  No history of kidney stones.  No history of bladder infections. Musculoskeletal:  No history of joint or back disease. Hematologic:  No bleeding disorder.  No history of anemia.  Not anticoagulated. Psycho-social:  The patient is oriented.   The patient has no obvious psychologic or social impairment to understanding our conversation and plan.  SOCIAL and FAMILY HISTORY: Widowed She lives at home by herself.  Still drives ??? She has 3 sons, [place], in [place], Shanon Brow in [place], and [place] ? location Son Raylan Troiani - [phone]  PHYSICAL EXAM: BP (!) 159/73 (BP Location: Right Arm)   Pulse (!) 53   Temp 98.3 F (36.8 C)   Resp 16   Ht 5\' 2"  (1.575 m)   Wt 62.8 kg   LMP  (Approximate)   SpO2 95%   BMI 25.32 kg/m   General: Older WF who is alert and generally healthy appearing.  Skin:  Inspection and palpation - no mass or rash. Eyes:  Conjunctiva and lids unremarkable.            Pupils are equal Ears, Nose, Mouth, and Throat:  Ears and nose unremarkable            Lips and teeth are unremarable. Neck: Supple. No mass, trachea midline.  No thyroid mass. Lymph Nodes:  No supraclavicular, cervical, or inguinal nodes. Lungs: Normal respiratory effort.  Clear to auscultation and symmetric breath sounds. Heart:  Palpation of the heart is normal.            Auscultation: RRR. No murmur  or rub.  Abdomen: Soft. No mass.  No hernia.             Few bowel sounds. No localized tenderness  Rectal: Not done. Musculoskeletal:  Normal gait.            Good muscle strength and ROM  in upper and lower extremities.  Neurologic:  Grossly intact to motor and sensory function. Psychiatric: Normal judgement and insight. Behavior is normal.            Oriented to time, person, place.   DATA REVIEWED, COUNSELING AND COORDINATION OF CARE: Epic notes reviewed. Counseling and coordination of care exceeded more than 50% of the time spent with patient. Total time spent with patient and charting: 45 minutes  Alphonsa Overall, MD,  [facility], [address]  [place], [place]    [place] Phone:  [phone] FAX:  [phone]

## 2018-10-25 ENCOUNTER — Inpatient Hospital Stay (HOSPITAL_COMMUNITY): Payer: Medicare Other | Admitting: Certified Registered"

## 2018-10-25 ENCOUNTER — Encounter (HOSPITAL_COMMUNITY): Payer: Self-pay | Admitting: Gastroenterology

## 2018-10-25 ENCOUNTER — Encounter (HOSPITAL_COMMUNITY)
Admission: EM | Disposition: A | Discharge status: Home Health | Payer: Self-pay | Source: Home / Self Care | Attending: Internal Medicine

## 2018-10-25 HISTORY — PX: CHOLECYSTECTOMY: SHX55

## 2018-10-25 LAB — COMPREHENSIVE METABOLIC PANEL
ALT: 249 U/L — ABNORMAL HIGH (ref 0–44)
AST: 94 U/L — ABNORMAL HIGH (ref 15–41)
Albumin: 2.9 g/dL — ABNORMAL LOW (ref 3.5–5.0)
Alkaline Phosphatase: 135 U/L — ABNORMAL HIGH (ref 38–126)
Anion gap: 10 (ref 5–15)
BUN: 35 mg/dL — ABNORMAL HIGH (ref 8–23)
CO2: 17 mmol/L — ABNORMAL LOW (ref 22–32)
Calcium: 7.6 mg/dL — ABNORMAL LOW (ref 8.9–10.3)
Chloride: 111 mmol/L (ref 98–111)
Creatinine, Ser: 1.52 mg/dL — ABNORMAL HIGH (ref 0.44–1.00)
GFR calc Af Amer: 33 mL/min — ABNORMAL LOW (ref >60)
GFR calc non Af Amer: 29 mL/min — ABNORMAL LOW (ref >60)
Glucose, Bld: 181 mg/dL — ABNORMAL HIGH (ref 70–99)
Potassium: 3.7 mmol/L (ref 3.5–5.1)
Sodium: 138 mmol/L (ref 135–145)
Total Bilirubin: 1.7 mg/dL — ABNORMAL HIGH (ref 0.3–1.2)
Total Protein: 5.6 g/dL — ABNORMAL LOW (ref 6.5–8.1)

## 2018-10-25 LAB — CBC WITH DIFFERENTIAL/PLATELET
Abs Immature Granulocytes: 0.05 10*3/uL (ref 0.00–0.07)
Basophils Absolute: 0 10*3/uL (ref 0.0–0.1)
Basophils Relative: 0 %
Eosinophils Absolute: 0.1 10*3/uL (ref 0.0–0.5)
Eosinophils Relative: 1 %
HCT: 38.1 % (ref 36.0–46.0)
Hemoglobin: 12 g/dL (ref 12.0–15.0)
Immature Granulocytes: 1 %
Lymphocytes Relative: 5 %
Lymphs Abs: 0.5 10*3/uL — ABNORMAL LOW (ref 0.7–4.0)
MCH: 29.9 pg (ref 26.0–34.0)
MCHC: 31.5 g/dL (ref 30.0–36.0)
MCV: 94.8 fL (ref 80.0–100.0)
Monocytes Absolute: 0.7 10*3/uL (ref 0.1–1.0)
Monocytes Relative: 6 %
Neutro Abs: 8.8 10*3/uL — ABNORMAL HIGH (ref 1.7–7.7)
Neutrophils Relative %: 87 %
Platelets: 164 10*3/uL (ref 150–400)
RBC: 4.02 MIL/uL (ref 3.87–5.11)
RDW: 15.2 % (ref 11.5–15.5)
WBC: 10.1 10*3/uL (ref 4.0–10.5)
nRBC: 0 % (ref 0.0–0.2)

## 2018-10-25 LAB — GLUCOSE, CAPILLARY: Glucose-Capillary: 155 mg/dL — ABNORMAL HIGH (ref 70–99)

## 2018-10-25 LAB — SURGICAL PCR SCREEN
MRSA, PCR: NEGATIVE
Staphylococcus aureus: NEGATIVE

## 2018-10-25 SURGERY — LAPAROSCOPIC CHOLECYSTECTOMY WITH INTRAOPERATIVE CHOLANGIOGRAM
Anesthesia: General | Site: Abdomen

## 2018-10-25 MED ORDER — PROPOFOL 10 MG/ML IV BOLUS
INTRAVENOUS | Status: DC | PRN
  Administered 2018-10-25: 50 mg via INTRAVENOUS

## 2018-10-25 MED ORDER — FENTANYL CITRATE (PF) 100 MCG/2ML IJ SOLN: 25.0000 ug | INTRAMUSCULAR | Status: DC | PRN

## 2018-10-25 MED ORDER — LACTATED RINGERS IV SOLN
INTRAVENOUS | Status: DC
  Administered 2018-10-25: 13:00:00 via INTRAVENOUS

## 2018-10-25 MED ORDER — SUCCINYLCHOLINE CHLORIDE 200 MG/10ML IV SOSY
PREFILLED_SYRINGE | INTRAVENOUS | Status: DC | PRN
  Administered 2018-10-25: 120 mg via INTRAVENOUS

## 2018-10-25 MED ORDER — BUPIVACAINE-EPINEPHRINE (PF) 0.25% -1:200000 IJ SOLN
INTRAMUSCULAR | Status: AC
  Filled 2018-10-25: qty 30

## 2018-10-25 MED ORDER — ROCURONIUM BROMIDE 10 MG/ML (PF) SYRINGE
PREFILLED_SYRINGE | INTRAVENOUS | Status: DC | PRN
  Administered 2018-10-25: 30 mg via INTRAVENOUS
  Administered 2018-10-25: 20 mg via INTRAVENOUS

## 2018-10-25 MED ORDER — PROPOFOL 10 MG/ML IV BOLUS
INTRAVENOUS | Status: AC
  Filled 2018-10-25: qty 20

## 2018-10-25 MED ORDER — SUGAMMADEX SODIUM 200 MG/2ML IV SOLN
INTRAVENOUS | Status: DC | PRN
  Administered 2018-10-25: 250 mg via INTRAVENOUS

## 2018-10-25 MED ORDER — ROCURONIUM BROMIDE 100 MG/10ML IV SOLN
INTRAVENOUS | Status: AC
  Filled 2018-10-25: qty 1

## 2018-10-25 MED ORDER — SUGAMMADEX SODIUM 500 MG/5ML IV SOLN
INTRAVENOUS | Status: AC
  Filled 2018-10-25: qty 5

## 2018-10-25 MED ORDER — BUPIVACAINE-EPINEPHRINE 0.25% -1:200000 IJ SOLN
INTRAMUSCULAR | Status: DC | PRN
  Administered 2018-10-25: 20 mL

## 2018-10-25 MED ORDER — FENTANYL CITRATE (PF) 100 MCG/2ML IJ SOLN
INTRAMUSCULAR | Status: AC
  Filled 2018-10-25: qty 2

## 2018-10-25 MED ORDER — ONDANSETRON HCL 4 MG/2ML IJ SOLN
INTRAMUSCULAR | Status: AC
  Filled 2018-10-25: qty 2

## 2018-10-25 MED ORDER — LIDOCAINE 2% (20 MG/ML) 5 ML SYRINGE
INTRAMUSCULAR | Status: DC | PRN
  Administered 2018-10-25: 30 mg via INTRAVENOUS

## 2018-10-25 MED ORDER — PHENYLEPHRINE 40 MCG/ML (10ML) SYRINGE FOR IV PUSH (FOR BLOOD PRESSURE SUPPORT)
PREFILLED_SYRINGE | INTRAVENOUS | Status: DC | PRN
  Administered 2018-10-25: 120 ug via INTRAVENOUS

## 2018-10-25 MED ORDER — TRAMADOL HCL 50 MG PO TABS: 50.0000 mg | ORAL_TABLET | Freq: Four times a day (QID) | ORAL | Status: DC | PRN

## 2018-10-25 MED ORDER — ACETAMINOPHEN 325 MG PO TABS
650.0000 mg | ORAL_TABLET | Freq: Four times a day (QID) | ORAL | Status: DC | PRN
  Administered 2018-10-25 – 2018-10-26 (×3): 650 mg via ORAL
  Filled 2018-10-25 (×3): qty 2

## 2018-10-25 MED ORDER — SODIUM CHLORIDE 0.9 % IR SOLN
Status: DC | PRN
  Administered 2018-10-25: 3000 mL

## 2018-10-25 MED ORDER — SUCCINYLCHOLINE CHLORIDE 200 MG/10ML IV SOSY
PREFILLED_SYRINGE | INTRAVENOUS | Status: AC
  Filled 2018-10-25: qty 10

## 2018-10-25 MED ORDER — FENTANYL CITRATE (PF) 250 MCG/5ML IJ SOLN
INTRAMUSCULAR | Status: DC | PRN
  Administered 2018-10-25: 25 ug via INTRAVENOUS
  Administered 2018-10-25: 50 ug via INTRAVENOUS
  Administered 2018-10-25 (×2): 25 ug via INTRAVENOUS

## 2018-10-25 MED ORDER — LIDOCAINE 2% (20 MG/ML) 5 ML SYRINGE
INTRAMUSCULAR | Status: AC
  Filled 2018-10-25: qty 5

## 2018-10-25 SURGICAL SUPPLY — 46 items
ADH SKN CLS APL DERMABOND .7 (GAUZE/BANDAGES/DRESSINGS) ×1
APL SKNCLS STERI-STRIP NONHPOA (GAUZE/BANDAGES/DRESSINGS) ×1
APPLIER CLIP ROT 10 11.4 M/L (STAPLE)
APR CLP MED LRG 11.4X10 (STAPLE)
BAG SPEC RTRVL 10 TROC 200 (ENDOMECHANICALS) ×1
BANDAGE ADH SHEER 1  50/CT (GAUZE/BANDAGES/DRESSINGS) ×8 IMPLANT
BENZOIN TINCTURE PRP APPL 2/3 (GAUZE/BANDAGES/DRESSINGS) ×2 IMPLANT
CABLE HIGH FREQUENCY MONO STRZ (ELECTRODE) ×2 IMPLANT
CATH CHOLANG 76X19 KUMAR (CATHETERS) ×2 IMPLANT
CHLORAPREP W/TINT 26ML (MISCELLANEOUS) ×2 IMPLANT
CLIP APPLIE ROT 10 11.4 M/L (STAPLE) IMPLANT
CLIP VESOLOCK LG 6/CT PURPLE (CLIP) IMPLANT
CLIP VESOLOCK MED LG 6/CT (CLIP) ×1 IMPLANT
COVER MAYO STAND STRL (DRAPES) ×2 IMPLANT
COVER SURGICAL LIGHT HANDLE (MISCELLANEOUS) ×2 IMPLANT
COVER WAND RF STERILE (DRAPES) IMPLANT
DECANTER SPIKE VIAL GLASS SM (MISCELLANEOUS) ×2 IMPLANT
DERMABOND ADVANCED (GAUZE/BANDAGES/DRESSINGS) ×1
DERMABOND ADVANCED .7 DNX12 (GAUZE/BANDAGES/DRESSINGS) IMPLANT
DRAIN CHANNEL 19F RND (DRAIN) IMPLANT
DRAPE C-ARM 42X120 X-RAY (DRAPES) IMPLANT
EVACUATOR SILICONE 100CC (DRAIN) IMPLANT
GLOVE BIOGEL PI IND STRL 7.0 (GLOVE) ×1 IMPLANT
GLOVE BIOGEL PI INDICATOR 7.0 (GLOVE) ×1
GLOVE SURG SS PI 7.0 STRL IVOR (GLOVE) ×2 IMPLANT
GOWN STRL REUS W/TWL LRG LVL3 (GOWN DISPOSABLE) ×2 IMPLANT
GOWN STRL REUS W/TWL XL LVL3 (GOWN DISPOSABLE) ×4 IMPLANT
GRASPER SUT TROCAR 14GX15 (MISCELLANEOUS) IMPLANT
KIT BASIN OR (CUSTOM PROCEDURE TRAY) ×2 IMPLANT
KIT TURNOVER KIT A (KITS) IMPLANT
POUCH RETRIEVAL ECOSAC 10 (ENDOMECHANICALS) ×1 IMPLANT
POUCH RETRIEVAL ECOSAC 10MM (ENDOMECHANICALS) ×1
SCISSORS LAP 5X35 DISP (ENDOMECHANICALS) ×2 IMPLANT
SET IRRIG TUBING LAPAROSCOPIC (IRRIGATION / IRRIGATOR) ×2 IMPLANT
SET TUBE SMOKE EVAC HIGH FLOW (TUBING) ×2 IMPLANT
SLEEVE XCEL OPT CAN 5 100 (ENDOMECHANICALS) ×4 IMPLANT
STOPCOCK 4 WAY LG BORE MALE ST (IV SETS) IMPLANT
STRIP CLOSURE SKIN 1/2X4 (GAUZE/BANDAGES/DRESSINGS) ×2 IMPLANT
SUT ETHILON 2 0 PS N (SUTURE) IMPLANT
SUT MNCRL AB 4-0 PS2 18 (SUTURE) ×2 IMPLANT
SUT VICRYL 0 ENDOLOOP (SUTURE) IMPLANT
TOWEL OR 17X26 10 PK STRL BLUE (TOWEL DISPOSABLE) ×2 IMPLANT
TOWEL OR NON WOVEN STRL DISP B (DISPOSABLE) IMPLANT
TRAY LAPAROSCOPIC (CUSTOM PROCEDURE TRAY) ×2 IMPLANT
TROCAR BLADELESS OPT 5 100 (ENDOMECHANICALS) ×2 IMPLANT
TROCAR XCEL NON-BLD 11X100MML (ENDOMECHANICALS) ×2 IMPLANT

## 2018-10-25 NOTE — Anesthesia Procedure Notes (Signed)
Procedure Name: Intubation Date/Time: 10/25/2018 2:47 PM Performed by: Niel Hummer, CRNA Pre-anesthesia Checklist: Patient being monitored, Suction available, Emergency Drugs available and Patient identified Patient Re-evaluated:Patient Re-evaluated prior to induction Oxygen Delivery Method: Circle system utilized Preoxygenation: Pre-oxygenation with 100% oxygen Induction Type: IV induction and Rapid sequence Laryngoscope Size: Mac and 3 Grade View: Grade I Tube type: Oral Number of attempts: 1 Airway Equipment and Method: Stylet Placement Confirmation: ETT inserted through vocal cords under direct vision,  positive ETCO2 and breath sounds checked- equal and bilateral Secured at: 23 cm Tube secured with: Tape Dental Injury: Teeth and Oropharynx as per pre-operative assessment

## 2018-10-25 NOTE — Anesthesia Preprocedure Evaluation (Signed)
Anesthesia Evaluation  Patient identified by MRN, date of birth, ID band Patient awake    Reviewed: Allergy & Precautions, NPO status , Patient's Chart, lab work & pertinent test results  Airway Mallampati: II  TM Distance: >3 FB     Dental   Pulmonary pneumonia,    breath sounds clear to auscultation       Cardiovascular hypertension, + CAD  + dysrhythmias  Rhythm:Regular Rate:Normal  History noted. CG   Neuro/Psych    GI/Hepatic GERD  ,  Endo/Other  diabetesHyperthyroidism   Renal/GU Renal disease     Musculoskeletal   Abdominal   Peds  Hematology   Anesthesia Other Findings   Reproductive/Obstetrics                             Anesthesia Physical Anesthesia Plan  ASA: III  Anesthesia Plan: General   Post-op Pain Management:    Induction: Intravenous  PONV Risk Score and Plan: Ondansetron  Airway Management Planned: Oral ETT  Additional Equipment:   Intra-op Plan:   Post-operative Plan: Possible Post-op intubation/ventilation  Informed Consent: I have reviewed the patients History and Physical, chart, labs and discussed the procedure including the risks, benefits and alternatives for the proposed anesthesia with the patient or authorized representative who has indicated his/her understanding and acceptance.     Dental advisory given  Plan Discussed with: CRNA and Anesthesiologist  Anesthesia Plan Comments:         Anesthesia Quick Evaluation

## 2018-10-25 NOTE — Transfer of Care (Signed)
Immediate Anesthesia Transfer of Care Note  Patient: Alexa Mcdonald  Procedure(s) Performed: LAPAROSCOPIC CHOLECYSTECTOMY WITH INTRAOPERATIVE CHOLANGIOGRAM (N/A Abdomen)  Patient Location: PACU  Anesthesia Type:General  Level of Consciousness: awake, alert  and oriented  Airway & Oxygen Therapy: Patient Spontanous Breathing and Patient connected to face mask oxygen  Post-op Assessment: Report given to RN and Post -op Vital signs reviewed and stable  Post vital signs: Reviewed and stable  Last Vitals:  Vitals Value Taken Time  BP    Temp    Pulse 85 10/25/2018  3:50 PM  Resp 18 10/25/2018  3:50 PM  SpO2 100 % 10/25/2018  3:50 PM  Vitals shown include unvalidated device data.  Last Pain:  Vitals:   10/25/18 0925  TempSrc:   PainSc: (P) 0-No pain      Patients Stated Pain Goal: 2 (09/40/76 8088)  Complications: No apparent anesthesia complications

## 2018-10-25 NOTE — Op Note (Signed)
PATIENT:  Alexa Mcdonald  85 y.o. female  PRE-OPERATIVE DIAGNOSIS:  cholecystitis  POST-OPERATIVE DIAGNOSIS:  cholecystitis  PROCEDURE:  Procedure(s): LAPAROSCOPIC CHOLECYSTECTOMY  SURGEON:  Surgeon(s): Clovis Riley, MD Modesto Ganoe, Arta Bruce, MD  ASSISTANT: Romana Juniper  ANESTHESIA:   local and general  Indications for procedure: Alexa Mcdonald is a 85 y.o. female with symptoms of Abdominal pain consistent with gallbladder disease, Confirmed by Ultrasound.  Description of procedure: The patient was brought into the operative suite, placed supine. Anesthesia was administered with endotracheal tube. Patient was strapped in place and foot board was secured. All pressure points were offloaded by foam padding. The patient was prepped and draped in the usual sterile fashion.  A small incision was made to the right of the umbilicus. A 55mm trocar was inserted into the peritoneal cavity with optical entry. Pneumoperitoneum was applied with high flow low pressure. 2 72mm trocars were placed in the RUQ. A 64mm trocar was placed in the subxiphoid space. Marcaine was infused to the subxiphoid space and lateral upper right abdomen in the transversus abdominis plane. Next the patient was placed in reverse trendelenberg. The gallbladder was edematous and distended and more lateral than most.  The gallbladder was retracted cephalad and lateral. The peritoneum was reflected off the infundibulum working lateral to medial. The cystic duct and cystic artery were identified and further dissection revealed a critical view. The cystic duct and cystic artery were doubly clipped and ligated.   The gallbladder was removed off the liver bed with cautery. The Gallbladder was placed in a specimen bag. The gallbladder fossa was irrigated and hemostasis was applied with cautery. The gallbladder was removed via the 58mm trocar. The fascial defect was closed with interrupted 0 vicryl suture via laparoscopic  trans-fascial suture passer. Pneumoperitoneum was removed, all trocar were removed. All incisions were closed with 4-0 monocryl subcuticular stitch. The patient woke from anesthesia and was brought to PACU in stable condition. All counts were correct  Findings: inflamed gallbladder  Specimen: gallbladder  Blood loss: 50 ml  Local anesthesia: 30 ml marcaine  Complications: none  PLAN OF CARE: Admit to inpatient   PATIENT DISPOSITION:  PACU - hemodynamically stable.  Gurney Maxin, M.D. General, Bariatric, & Minimally Invasive Surgery [facility] [place]

## 2018-10-25 NOTE — Anesthesia Postprocedure Evaluation (Signed)
Anesthesia Post Note  Patient: Alexa Mcdonald  Procedure(s) Performed: LAPAROSCOPIC CHOLECYSTECTOMY WITH INTRAOPERATIVE CHOLANGIOGRAM (N/A Abdomen)     Patient location during evaluation: PACU Anesthesia Type: General Level of consciousness: awake Pain management: pain level controlled Vital Signs Assessment: post-procedure vital signs reviewed and stable Respiratory status: spontaneous breathing Cardiovascular status: stable Postop Assessment: no apparent nausea or vomiting Anesthetic complications: no    Last Vitals:  Vitals:   10/25/18 1625 10/25/18 1701  BP: (!) 167/74 (!) 173/65  Pulse: 86 66  Resp: (!) 24 19  Temp:  36.5 C  SpO2: 98% 97%    Last Pain:  Vitals:   10/25/18 1720  TempSrc:   PainSc: 2                  Alysiana Ethridge

## 2018-10-25 NOTE — Progress Notes (Signed)
PROGRESS NOTE  Alexa Mcdonald:[id number] DOB: 1921/07/23 DOA: 10/22/2018 PCP: Jonathon Jordan, MD  Brief History   The patient is a 85 yr old woman who presented to the ED with a complaint of nausea and not feeling well for a few days. She was found to have an elvated creatinine,, AST, ALT, and bilirubin. WBC was 17.4. CT was concerning for choledocholithiasis.   The patient has been admitted to a medical bed. GI was consulted. She has been started on cefepime and flagyl. Blood cultures have been obtained. No growth thus far. ERCP on 10/24/2018 with sphincterotomy, sphincteroplasty, and stone extraction. It demonstrated 4 stones, 2 of which were large.   General surgery was consulted for cholecystitis. She will undergo lap chole today. Consultants  . Gastroenterology  . General Surgery  Procedures  . ERCP  Antibiotics  . Cefepime and Flagyl  Interval History/Subjective  See above  Objective   Vitals:  Vitals:   10/25/18 0107 10/25/18 0522  BP: (!) 178/77 (!) 115/53  Pulse: 63 69  Resp: 18 18  Temp:  99 F (37.2 C)  SpO2: 98% 98%    Exam:  Constitutional:  . The patient is elderly, weak, and frail. She is awake, alert, and oriented x 3. No acute distress. Respiratory:  . No wheezes, rales, or rhonchi. . No tactile fremitus . No increased work of breathing. No wheezes, rales, or rhonchi. Cardiovascular:  . Regular rate and rhythm.  . No murmurs, ectopy, or gallups . No lateral PMI. No thrills . Normal pedal pulses Abdomen:  . Abdomen is soft, non-tender, non-distended. . No hernias, masses, or organomegaly are appreciated. [name] Hypoactive bowel sounds. Musculoskeletal:  . NO cyanosis, clubbing or edema Skin:  . No rashes, lesions, ulcers . palpation of skin: no induration or nodules Neurologic:  . CN 2-12 intact . Moving all extremities. Psychiatric:  . Mental status o Mood, affect appropriate  . judgment and insight appear intact     I have  personally reviewed the following:   Today's Data  . CMP, CBC, Vitals  Scheduled Meds: . Chlorhexidine Gluconate Cloth  6 each Topical Q0600  . indomethacin  100 mg Rectal Once  . Influenza vac split quadrivalent PF  0.5 mL Intramuscular Tomorrow-1000   Continuous Infusions: . cefTRIAXone (ROCEPHIN)  IV 2 g (10/25/18 1143)  . dextrose 5 % and 0.9% NaCl 75 mL/hr at 10/24/18 2142  . metronidazole 500 mg (10/25/18 0929)    Principal Problem:   Choledocholithiasis with obstruction Active Problems:   Essential hypertension   CKD (chronic kidney disease), stage III (HCC)   Interstitial lung disease (HCC)   S/P TAVR (transcatheter aortic valve replacement)   Paroxysmal atrial fibrillation (HCC)   A & P  Choledocholithiasis with obstruction: AST up to 790. ALT is now 538. Direct bilirubin is 2.1. Indirect is 1.8. T bili 3.9. up from 2.6. WBC is 20.2. GI is consulted. We appreciate their help. She is receiving IV Rocephin and Flagyl.  ERCP on 10/24/2018 with sphincterotomy, sphincteroplasty, and stone extraction. It demonstrated 4 stones, 2 of which were large.  General surgery was consulted. The patient will undergo lap chole today.  CKD Stage III: Creatinine 1.52 today. Monitor creatinine, electrolytes, and volume status.  Essential Hypertension: Well controlled on IV hydralazine as needed.   S/P TAVR: Noted. Savaysa held for ERCP and surgery.   Interstitial Lung Disease: Noted. The patient is saturating 97% on room air.  Paroxysmal Atrial Fibrillation: Rate is controlled. Baird Cancer is  held for ERCP and lap chole.  DVT prophylaxis: SCD's Code Status: Full Code Family Communication: No family is available Disposition Plan: home.  Alexa Fiske, DO [facility] Direct contact: see [email or web address]  7PM-7AM contact night coverage as above 10/23/2018, 2:50 PM  LOS: 0 days    LOS: 2 days

## 2018-10-25 NOTE — Progress Notes (Addendum)
Patient ID: Alexa Mcdonald, female   DOB: [birth year], 85 y.o.   MRN: [id number]     Progress Note   Subjective   S/P ERCP yesterday  With sphincterotomy and extraction of 4 CBD stones. Pt feels "fine" - no c/o abdominal  pain or nausea. For surgery today    Objective   Vital signs in last 24 hours: Temp:  [98.3 F (36.8 C)-99 F (37.2 C)] 99 F (37.2 C) (03/23 0522) Pulse Rate:  [50-69] 69 (03/23 0522) Resp:  [16-20] 18 (03/23 0522) BP: (115-198)/(53-78) 115/53 (03/23 0522) SpO2:  [95 %-98 %] 98 % (03/23 0522) Last BM Date: 10/23/18 General:   elderly white female in NAD Heart:  Regular rate and rhythm; no murmurs Lungs: Respirations even and unlabored, lungs CTA bilaterally Abdomen:  Soft, nontender and nondistended. Normal bowel sounds. Extremities:  Without edema. Neurologic:  Alert and oriented,  grossly normal neurologically. Psych:  Cooperative. Normal mood and affect.  Intake/Output from previous day: 03/22 0701 - 03/23 0700 In: 1890.1 [I.V.:1390.1; IV Piggyback:500] Out: 0  Intake/Output this shift: No intake/output data recorded.  Lab Results: Recent Labs    10/23/18 0341 10/24/18 0502 10/25/18 0823  WBC 20.2* 12.9* 10.1  HGB 12.8 12.0 12.0  HCT 41.9 37.0 38.1  PLT 171 156 164   BMET Recent Labs    10/23/18 0341 10/24/18 0502 10/25/18 0823  NA 137 137 138  K 3.9 3.8 3.7  CL 105 108 111  CO2 23 21* 17*  GLUCOSE 291* 157* 181*  BUN 27* 29* 35*  CREATININE 1.28* 1.34* 1.52*  CALCIUM 8.8* 8.2* 7.6*   LFT Recent Labs    10/23/18 0341  10/25/18 0823  PROT 6.0*   < > 5.6*  ALBUMIN 3.5   < > 2.9*  AST 790*   < > 94*  ALT 538*   < > 249*  ALKPHOS 123   < > 135*  BILITOT 3.9*   < > 1.7*  BILIDIR 2.1*  --   --   IBILI 1.8*  --   --    < > = values in this interval not displayed.   PT/INR No results for input(s): LABPROT, INR in the last 72 hours.  Studies/Results: Dg Ercp With Sphincterotomy  Result Date: 10/24/2018 CLINICAL DATA:   Cholangitis, probable choledocholithiasis EXAM: ERCP TECHNIQUE: Multiple spot images obtained with the fluoroscopic device and submitted for interpretation post-procedure. COMPARISON:  CT 10/22/2018 FINDINGS: A series of fluoroscopic spot images document endoscopic cannulation and opacification of the CBD. Filling defects within the proximal CBD which is distended. Incomplete opacification of the biliary tree, which appears mildly distended centrally. Subsequent images document balloon catheter passage through the CBD with apparent clearance of filling defects. No evidence of extravasation. IMPRESSION: Endoscopic CBD cannulation and intervention. These images were submitted for radiologic interpretation only. Please see the procedural report for the amount of contrast and the fluoroscopy time utilized. Electronically Signed   By: Lucrezia Europe M.D.   On: 10/24/2018 14:25       Assessment / Plan:     #1 85 yo female stable s/p ERCP and stone extraction yesterday - LFT's trending down  #2 Cholelithiasis - for lap chole today  #3 CKD  #4 s/p TAVR #5 hx Afib     Plan ; GI will sign off -pt for lap chole today  Follow Lft's  Please call if needed       Principal Problem:   Choledocholithiasis with obstruction Active Problems:  Essential hypertension   CKD (chronic kidney disease), stage III (HCC)   Interstitial lung disease (HCC)   S/P TAVR (transcatheter aortic valve replacement)   Paroxysmal atrial fibrillation (HCC)     LOS: 2 days   Amy Esterwood  10/25/2018, 9:59 AM    Attending physician's note   I have taken an interval history, reviewed the chart and examined the patient. I agree with the Advanced Practitioner's note, impression and recommendations.   85 year old with ascending cholangitis s/p ERCP with biliary sphincterotomy/sphincteroplasty, extraction of 4 large stones.  For lap chole today.  No abdominal pain, LFTs trending down well.  WBC count down.  Will sign off for  now.  Carmell Austria, MD

## 2018-10-25 NOTE — Discharge Instructions (Signed)
Managing Your Pain After Surgery Without Opioids    Thank you for participating in our program to help patients manage their pain after surgery without opioids. This is part of our effort to provide you with the best care possible, without exposing you or your family to the risk that opioids pose.  What pain can I expect after surgery? You can expect to have some pain after surgery. This is normal. The pain is typically worse the day after surgery, and quickly begins to get better. Many studies have found that many patients are able to manage their pain after surgery with Over-the-Counter (OTC) medications such as Tylenol and Motrin. If you have a condition that does not allow you to take Tylenol or Motrin, notify your surgical team.  How will I manage my pain? The best strategy for controlling your pain after surgery is around the clock pain control with Tylenol (acetaminophen) and Motrin (ibuprofen or Advil). Alternating these medications with each other allows you to maximize your pain control. In addition to Tylenol and Motrin, you can use heating pads or ice packs on your incisions to help reduce your pain.  How will I alternate your regular strength over-the-counter pain medication? You will take a dose of pain medication every three hours. ; Start by taking 650 mg of Tylenol (2 pills of 325 mg) ; 3 hours later take 600 mg of Motrin (3 pills of 200 mg) ; 3 hours after taking the Motrin take 650 mg of Tylenol ; 3 hours after that take 600 mg of Motrin.   - 1 -  See example - if your first dose of Tylenol is at 12:00 PM   12:00 PM Tylenol 650 mg (2 pills of 325 mg)  3:00 PM Motrin 600 mg (3 pills of 200 mg)  6:00 PM Tylenol 650 mg (2 pills of 325 mg)  9:00 PM Motrin 600 mg (3 pills of 200 mg)  Continue alternating every 3 hours   We recommend that you follow this schedule around-the-clock for at least 3 days after surgery, or until you feel that it is no longer needed. Use  the table on the last page of this handout to keep track of the medications you are taking. Important: Do not take more than 3000mg  of Tylenol or 3200mg  of Motrin in a 24-hour period. Do not take ibuprofen/Motrin if you have a history of bleeding stomach ulcers, severe kidney disease, &/or actively taking a blood thinner  What if I still have pain? If you have pain that is not controlled with the over-the-counter pain medications (Tylenol and Motrin or Advil) you might have what we call breakthrough pain. You will receive a prescription for a small amount of an opioid pain medication such as Oxycodone, Tramadol, or Tylenol with Codeine. Use these opioid pills in the first 24 hours after surgery if you have breakthrough pain. Do not take more than 1 pill every 4-6 hours.  If you still have uncontrolled pain after using all opioid pills, don't hesitate to call our staff using the number provided. We will help make sure you are managing your pain in the best way possible, and if necessary, we can provide a prescription for additional pain medication.   Day 1    Time  Name of Medication Number of pills taken  Amount of Acetaminophen  Pain Level   Comments  AM PM       AM PM       AM PM  AM PM       AM PM       AM PM       AM PM       AM PM       Total Daily amount of Acetaminophen Do not take more than  3,000 mg per day      Day 2    Time  Name of Medication Number of pills taken  Amount of Acetaminophen  Pain Level   Comments  AM PM       AM PM       AM PM       AM PM       AM PM       AM PM       AM PM       AM PM       Total Daily amount of Acetaminophen Do not take more than  3,000 mg per day      Day 3    Time  Name of Medication Number of pills taken  Amount of Acetaminophen  Pain Level   Comments  AM PM       AM PM       AM PM       AM PM          AM PM       AM PM       AM PM       AM PM       Total Daily amount of Acetaminophen Do  not take more than  3,000 mg per day      Day 4    Time  Name of Medication Number of pills taken  Amount of Acetaminophen  Pain Level   Comments  AM PM       AM PM       AM PM       AM PM       AM PM       AM PM       AM PM       AM PM       Total Daily amount of Acetaminophen Do not take more than  3,000 mg per day      Day 5    Time  Name of Medication Number of pills taken  Amount of Acetaminophen  Pain Level   Comments  AM PM       AM PM       AM PM       AM PM       AM PM       AM PM       AM PM       AM PM       Total Daily amount of Acetaminophen Do not take more than  3,000 mg per day       Day 6    Time  Name of Medication Number of pills taken  Amount of Acetaminophen  Pain Level  Comments  AM PM       AM PM       AM PM       AM PM       AM PM       AM PM       AM PM       AM PM       Total Daily amount of Acetaminophen Do not take more than  3,000 mg per day      Day 7    Time  Name of Medication Number of pills taken  Amount of Acetaminophen  Pain Level   Comments  AM PM       AM PM       AM PM       AM PM       AM PM       AM PM       AM PM       AM PM       Total Daily amount of Acetaminophen Do not take more than  3,000 mg per day        For additional information about how and where to safely dispose of unused opioid medications - [employer]  Disclaimer: This document contains information and/or instructional materials adapted from [facility] for the typical patient with your condition. It does not replace medical advice from your health care provider because your experience may differ from that of the typical patient. Talk to your health care provider if you have any questions about this document, your condition or your treatment plan. Adapted from [facility] LAPAROSCOPIC SURGERY: POST OP INSTRUCTIONS Always review your discharge  instruction sheet given to you by the facility where your surgery was performed. IF YOU HAVE DISABILITY OR FAMILY LEAVE FORMS, YOU MUST BRING THEM TO THE OFFICE FOR PROCESSING.   DO NOT GIVE THEM TO YOUR DOCTOR.  PAIN CONTROL  1. First take acetaminophen (Tylenol) AND/or ibuprofen (Advil) to control your pain after surgery.  Follow directions on package.  Taking acetaminophen (Tylenol) and/or ibuprofen (Advil) regularly after surgery will help to control your pain and lower the amount of prescription pain medication you may need.  You should not take more than 3,000 mg (3 grams) of acetaminophen (Tylenol) in 24 hours.  You should not take ibuprofen (Advil), aleve, motrin, naprosyn or other NSAIDS if you have a history of stomach ulcers or chronic kidney disease.  2. A prescription for pain medication may be given to you upon discharge.  Take your pain medication as prescribed, if you still have uncontrolled pain after taking acetaminophen (Tylenol) or ibuprofen (Advil). 3. Use ice packs to help control pain. 4. If you need a refill on your pain medication, please contact your pharmacy.  They will contact our office to request authorization. Prescriptions will not be filled after 5pm or on week-ends.  HOME MEDICATIONS 5. Take your usually prescribed medications unless otherwise directed.  DIET 6. You should follow a light diet the first few days after arrival home.  Be sure to include lots of fluids daily. Avoid fatty, fried foods.   CONSTIPATION 7. It is common to experience some constipation after surgery and if you are taking pain medication.  Increasing fluid intake and taking a stool softener (such as Colace) will usually help or prevent this problem from occurring.  A mild laxative (Milk of Magnesia or Miralax) should be taken according to package instructions if there are no bowel movements after 48 hours.  WOUND/INCISION CARE 8. Most patients will experience some swelling and bruising in  the area of the incisions.  Ice packs will help.  Swelling and bruising can take several days to resolve.  9. Unless discharge instructions indicate otherwise, follow guidelines below  a. STERI-STRIPS - you may remove your outer bandages 48 hours after surgery, and you may shower at that time.  You have steri-strips (  small skin tapes) in place directly over the incision.  These strips should be left on the skin for 7-10 days.   b. DERMABOND/SKIN GLUE - you may shower in 24 hours.  The glue will flake off over the next 2-3 weeks. 10. Any sutures or staples will be removed at the office during your follow-up visit.  ACTIVITIES 11. You may resume regular (light) daily activities beginning the next day--such as daily self-care, walking, climbing stairs--gradually increasing activities as tolerated.  You may have sexual intercourse when it is comfortable.  Refrain from any heavy lifting or straining until approved by your doctor. a. You may drive when you are no longer taking prescription pain medication, you can comfortably wear a seatbelt, and you can safely maneuver your car and apply brakes.  FOLLOW-UP 12. You should see your doctor in the office for a follow-up appointment approximately 2-3 weeks after your surgery.  You should have been given your post-op/follow-up appointment when your surgery was scheduled.  If you did not receive a post-op/follow-up appointment, make sure that you call for this appointment within a day or two after you arrive home to insure a convenient appointment time.   WHEN TO CALL YOUR DOCTOR: 1. Fever over 101.0 2. Inability to urinate 3. Continued bleeding from incision. 4. Increased pain, redness, or drainage from the incision. 5. Increasing abdominal pain  The clinic staff is available to answer your questions during regular business hours.  Please dont hesitate to call and ask to speak to one of the nurses for clinical concerns.  If you have a medical emergency,  go to the nearest emergency room or call 911.  A surgeon from [facility] is always on call at the hospital. [address], [place], [place], [place]  [address] ? [address], [place], [place]   [address] [phone] ? [phone] ? [phone] Web site: [email or web address]

## 2018-10-25 NOTE — Progress Notes (Signed)
Spoke with son to obtained informed consent. Prepared for surgery. SRP,RN

## 2018-10-25 NOTE — Progress Notes (Addendum)
Progress Note: General Surgery Service   Assessment/Plan: Principal Problem:   Choledocholithiasis with obstruction Active Problems:   Essential hypertension   CKD (chronic kidney disease), stage III (HCC)   Interstitial lung disease (HCC)   S/P TAVR (transcatheter aortic valve replacement)   Paroxysmal atrial fibrillation (HCC)  s/p Procedure(s): ENDOSCOPIC RETROGRADE CHOLANGIOPANCREATOGRAPHY (ERCP) SPHINCTEROTOMY BILIARY DILATION REMOVAL OF STONES 10/24/2018 85 yo Mcdonald with choledocholithiasis s/p ERCP with successful removal of stones. -will discuss with son for possible lap chole to decrease future risk vs observation    LOS: 2 days  Chief Complaint/Subjective: No abdominal pain  Objective: Vital signs in last 24 hours: Temp:  [98.3 F (36.8 C)-99 F (37.2 C)] 99 F (37.2 C) (03/23 0522) Pulse Rate:  [50-69] 69 (03/23 0522) Resp:  [16-20] 18 (03/23 0522) BP: (115-198)/(53-78) 115/53 (03/23 0522) SpO2:  [Alexa %-98 %] 98 % (03/23 0522) Last BM Date: 10/23/18  Intake/Output from previous day: 03/22 0701 - 03/23 0700 In: 1890.1 [I.V.:1390.1; IV Piggyback:500] Out: 0  Intake/Output this shift: No intake/output data recorded.  Lungs: nonlabored  Cardiovascular: RRR  Abd: soft, slight epigastric tenderness  Extremities: no edema  Neuro: AOx2 (did not know year or president or reason for hospitalization)  Lab Results: CBC  Recent Labs    10/23/18 0341 10/24/18 0502  WBC 20.2* 12.9*  HGB 12.8 12.0  HCT 41.9 37.0  PLT 171 156   BMET Recent Labs    10/23/18 0341 10/24/18 0502  NA 137 137  K 3.9 3.8  CL 105 108  CO2 23 21*  GLUCOSE 291* 157*  BUN 27* 29*  CREATININE 1.28* 1.34*  CALCIUM 8.8* 8.2*   PT/INR No results for input(s): LABPROT, INR in the last 72 hours. ABG No results for input(s): PHART, HCO3 in the last 72 hours.  Invalid input(s): PCO2, PO2  Studies/Results:  Anti-infectives: Anti-infectives (From admission, onward)   Start     Dose/Rate Route Frequency Ordered Stop   10/23/18 1500  ceFEPIme (MAXIPIME) 2 g in sodium chloride 0.9 % 100 mL IVPB  Status:  Discontinued     2 g 200 mL/hr over 30 Minutes Intravenous Every 8 hours 10/23/18 1458 10/23/18 1503   10/23/18 1500  metroNIDAZOLE (FLAGYL) IVPB 500 mg  Status:  Discontinued     500 mg 100 mL/hr over 60 Minutes Intravenous  Once 10/23/18 1458 10/23/18 1503   10/23/18 1000  cefTRIAXone (ROCEPHIN) 2 g in sodium chloride 0.9 % 100 mL IVPB     2 g 200 mL/hr over 30 Minutes Intravenous Every 24 hours 10/23/18 0021     10/23/18 0800  metroNIDAZOLE (FLAGYL) IVPB 500 mg     500 mg 100 mL/hr over 60 Minutes Intravenous Every 8 hours 10/23/18 0021     10/22/18 2300  ceFEPIme (MAXIPIME) 2 g in sodium chloride 0.9 % 100 mL IVPB     2 g 200 mL/hr over 30 Minutes Intravenous  Once 10/22/18 2255 10/23/18 0233   10/22/18 2300  metroNIDAZOLE (FLAGYL) IVPB 500 mg     500 mg 100 mL/hr over 60 Minutes Intravenous  Once 10/22/18 2255 10/23/18 0051      Medications: Scheduled Meds: . Chlorhexidine Gluconate Cloth  6 each Topical Q0600  . indomethacin  100 mg Rectal Once  . Influenza vac split quadrivalent PF  0.5 mL Intramuscular Tomorrow-1000   Continuous Infusions: . cefTRIAXone (ROCEPHIN)  IV Stopped (10/24/18 1041)  . dextrose 5 % and 0.9% NaCl 75 mL/hr at 10/24/18 2142  . metronidazole  500 mg (10/25/18 0039)   PRN Meds:.hydrALAZINE, morphine injection, ondansetron **OR** ondansetron (ZOFRAN) IV, promethazine  Mickeal Skinner, MD [facility]  Addendum- Spoke with son [name]) about patient and risks and benefits of this surgery. We both agreed the benefits outweigh the risks especially with concerns of future healthcare surge needs. -OR today for lap chole

## 2018-10-26 ENCOUNTER — Encounter (HOSPITAL_COMMUNITY): Payer: Self-pay | Admitting: General Surgery

## 2018-10-26 LAB — COMPREHENSIVE METABOLIC PANEL
ALT: 186 U/L — ABNORMAL HIGH (ref 0–44)
AST: 66 U/L — ABNORMAL HIGH (ref 15–41)
Albumin: 2.9 g/dL — ABNORMAL LOW (ref 3.5–5.0)
Alkaline Phosphatase: 122 U/L (ref 38–126)
Anion gap: 10 (ref 5–15)
BUN: 32 mg/dL — ABNORMAL HIGH (ref 8–23)
CO2: 17 mmol/L — ABNORMAL LOW (ref 22–32)
Calcium: 7.7 mg/dL — ABNORMAL LOW (ref 8.9–10.3)
Chloride: 112 mmol/L — ABNORMAL HIGH (ref 98–111)
Creatinine, Ser: 1.29 mg/dL — ABNORMAL HIGH (ref 0.44–1.00)
GFR calc Af Amer: 41 mL/min — ABNORMAL LOW (ref >60)
GFR calc non Af Amer: 35 mL/min — ABNORMAL LOW (ref >60)
Glucose, Bld: 125 mg/dL — ABNORMAL HIGH (ref 70–99)
Potassium: 3.6 mmol/L (ref 3.5–5.1)
Sodium: 139 mmol/L (ref 135–145)
Total Bilirubin: 1.5 mg/dL — ABNORMAL HIGH (ref 0.3–1.2)
Total Protein: 5.5 g/dL — ABNORMAL LOW (ref 6.5–8.1)

## 2018-10-26 LAB — CBC WITH DIFFERENTIAL/PLATELET
Abs Immature Granulocytes: 0.08 10*3/uL — ABNORMAL HIGH (ref 0.00–0.07)
Basophils Absolute: 0 10*3/uL (ref 0.0–0.1)
Basophils Relative: 0 %
Eosinophils Absolute: 0.1 10*3/uL (ref 0.0–0.5)
Eosinophils Relative: 1 %
HCT: 37.9 % (ref 36.0–46.0)
Hemoglobin: 11.8 g/dL — ABNORMAL LOW (ref 12.0–15.0)
Immature Granulocytes: 1 %
Lymphocytes Relative: 5 %
Lymphs Abs: 0.5 10*3/uL — ABNORMAL LOW (ref 0.7–4.0)
MCH: 29.8 pg (ref 26.0–34.0)
MCHC: 31.1 g/dL (ref 30.0–36.0)
MCV: 95.7 fL (ref 80.0–100.0)
Monocytes Absolute: 0.7 10*3/uL (ref 0.1–1.0)
Monocytes Relative: 7 %
Neutro Abs: 9 10*3/uL — ABNORMAL HIGH (ref 1.7–7.7)
Neutrophils Relative %: 86 %
Platelets: 183 10*3/uL (ref 150–400)
RBC: 3.96 MIL/uL (ref 3.87–5.11)
RDW: 15.5 % (ref 11.5–15.5)
WBC: 10.4 10*3/uL (ref 4.0–10.5)
nRBC: 0 % (ref 0.0–0.2)

## 2018-10-26 MED ORDER — CEPHALEXIN 250 MG PO CAPS: 250.0000 mg | ORAL_CAPSULE | Freq: Four times a day (QID) | ORAL | 0 refills | Status: AC

## 2018-10-26 MED ORDER — ACETAMINOPHEN 325 MG PO TABS: 650.0000 mg | ORAL_TABLET | Freq: Four times a day (QID) | ORAL | 0 refills | Status: DC | PRN

## 2018-10-26 MED ORDER — TRAMADOL HCL 50 MG PO TABS: 50.0000 mg | ORAL_TABLET | Freq: Four times a day (QID) | ORAL | 0 refills | Status: DC | PRN

## 2018-10-26 MED ORDER — EDOXABAN TOSYLATE 30 MG PO TABS: 30.0000 mg | ORAL_TABLET | Freq: Every day | ORAL | 0 refills | Status: DC

## 2018-10-26 MED ORDER — METRONIDAZOLE 500 MG PO TABS: 500.0000 mg | ORAL_TABLET | Freq: Three times a day (TID) | ORAL | 0 refills | Status: AC

## 2018-10-26 NOTE — Progress Notes (Signed)
Progress Note: General Surgery Service   Assessment/Plan: Principal Problem:   Choledocholithiasis with obstruction Active Problems:   Essential hypertension   CKD (chronic kidney disease), stage III (HCC)   Interstitial lung disease (HCC)   S/P TAVR (transcatheter aortic valve replacement)   Paroxysmal atrial fibrillation (HCC)  s/p Procedure(s): LAPAROSCOPIC CHOLECYSTECTOMY 10/25/2018  POD 1 lap chole for choledocholithasis, POD 2 ERCP -on reg diet -PT evaluation for stability -ok to discharge from surgery standpoint     LOS: 3 days  Chief Complaint/Subjective: No abdominal pain, she did not eat last night  Objective: Vital signs in last 24 hours: Temp:  [97.7 F (36.5 C)-98.9 F (37.2 C)] 98.9 F (37.2 C) (03/24 0556) Pulse Rate:  [52-93] 52 (03/24 0556) Resp:  [12-24] 16 (03/24 0556) BP: (140-189)/(65-80) 140/65 (03/24 0556) SpO2:  [97 %-100 %] 98 % (03/24 0556) Weight:  [65.2 kg] 65.2 kg (03/24 0552) Last BM Date: 10/25/18  Intake/Output from previous day: 03/23 0701 - 03/24 0700 In: 887.3 [I.V.:587.3; IV Piggyback:300] Out: 20 [Blood:20] Intake/Output this shift: No intake/output data recorded.  Lungs: nonlabored  Cardiovascular: bradycardic  Abd: soft, incisions c/d/i, nontender  Extremities: no edema  Neuro: alert, answers questions, does not remember surgery from yesterday  Lab Results: CBC  Recent Labs    10/25/18 0823 10/26/18 0457  WBC 10.1 10.4  HGB 12.0 11.8*  HCT 38.1 37.9  PLT 164 183   BMET Recent Labs    10/25/18 0823 10/26/18 0457  NA 138 139  K 3.7 3.6  CL 111 112*  CO2 17* 17*  GLUCOSE 181* 125*  BUN 35* 32*  CREATININE 1.52* 1.29*  CALCIUM 7.6* 7.7*   PT/INR No results for input(s): LABPROT, INR in the last 72 hours. ABG No results for input(s): PHART, HCO3 in the last 72 hours.  Invalid input(s): PCO2, PO2  Studies/Results:  Anti-infectives: Anti-infectives (From admission, onward)   Start      Dose/Rate Route Frequency Ordered Stop   10/23/18 1500  ceFEPIme (MAXIPIME) 2 g in sodium chloride 0.9 % 100 mL IVPB  Status:  Discontinued     2 g 200 mL/hr over 30 Minutes Intravenous Every 8 hours 10/23/18 1458 10/23/18 1503   10/23/18 1500  metroNIDAZOLE (FLAGYL) IVPB 500 mg  Status:  Discontinued     500 mg 100 mL/hr over 60 Minutes Intravenous  Once 10/23/18 1458 10/23/18 1503   10/23/18 1000  cefTRIAXone (ROCEPHIN) 2 g in sodium chloride 0.9 % 100 mL IVPB     2 g 200 mL/hr over 30 Minutes Intravenous Every 24 hours 10/23/18 0021     10/23/18 0800  metroNIDAZOLE (FLAGYL) IVPB 500 mg     500 mg 100 mL/hr over 60 Minutes Intravenous Every 8 hours 10/23/18 0021     10/22/18 2300  ceFEPIme (MAXIPIME) 2 g in sodium chloride 0.9 % 100 mL IVPB     2 g 200 mL/hr over 30 Minutes Intravenous  Once 10/22/18 2255 10/23/18 0233   10/22/18 2300  metroNIDAZOLE (FLAGYL) IVPB 500 mg     500 mg 100 mL/hr over 60 Minutes Intravenous  Once 10/22/18 2255 10/23/18 0051      Medications: Scheduled Meds: . Influenza vac split quadrivalent PF  0.5 mL Intramuscular Tomorrow-1000   Continuous Infusions: . cefTRIAXone (ROCEPHIN)  IV Stopped (10/25/18 1213)  . metronidazole 500 mg (10/25/18 2335)   PRN Meds:.acetaminophen, hydrALAZINE, morphine injection, ondansetron **OR** ondansetron (ZOFRAN) IV, traMADol  Mickeal Skinner, MD [facility]

## 2018-10-26 NOTE — Progress Notes (Signed)
CCMD made this RN aware that pt. Is having multiple pauses on telemetry, longest being 2.21 seconds. Pt. Resting comfortably in bed. On call NP Baltazar Najjar paged and made aware. Will continue to monitor pt. Closely.

## 2018-10-26 NOTE — Progress Notes (Signed)
Pt discharged to home, instructions reviewed with family acknowledged understanding.SRP, RN

## 2018-10-26 NOTE — TOC Initial Note (Signed)
Transition of Care [facility]) - Initial/Assessment Note    Patient Details  Name: Alexa Mcdonald MRN: [id number] Date of Birth: 1920/12/29  Transition [facility]) CM/SW Contact:    Purcell Mouton, RN Phone Number: 10/26/2018, 12:23 PM  Clinical Narrative:                  Pt will discharge with [facility] 1st program  Expected Discharge Plan: [place] Barriers to Discharge: No Barriers Identified   Patient Goals and CMS Choice     Choice offered to / list presented to : Adult Children  Expected Discharge Plan and Services Expected Discharge Plan: [place]   Discharge Planning Services: CM Consult   Living arrangements for the past 2 months: Single Family Home Expected Discharge Date: 10/25/18                   HH Arranged: RN, PT, OT, NA HH Agency: [place]  Prior Living Arrangements/Services Living arrangements for the past 2 months: Single Family Home Lives with:: Self Patient language and need for interpreter reviewed:: No Do you feel safe going back to the place where you live?: Yes      Need for Family Participation in Patient Care: Yes (Comment) Care giver support system in place?: Yes (comment)   Criminal Activity/Legal Involvement Pertinent to Current Situation/Hospitalization: No - Comment as needed  Activities of Daily Living Home Assistive Devices/Equipment: Cane (specify quad or straight) ADL Screening (condition at time of admission) Patient's cognitive ability adequate to safely complete daily activities?: Yes Is the patient deaf or have difficulty hearing?: No Does the patient have difficulty seeing, even when wearing glasses/contacts?: No Does the patient have difficulty concentrating, remembering, or making decisions?: No Patient able to express need for assistance with ADLs?: Yes Does the patient have difficulty dressing or bathing?: No Independently performs ADLs?: Yes (appropriate for  developmental age) Does the patient have difficulty walking or climbing stairs?: No Weakness of Legs: Both Weakness of Arms/Hands: None  Permission Sought/Granted Permission sought to share information with : Case Manager                Emotional Assessment Appearance:: Appears stated age   Affect (typically observed): Accepting, Calm Orientation: : Oriented to Self, Oriented to Place, Oriented to  Time, Oriented to Situation   Psych Involvement: No (comment)  Admission diagnosis:  Biliary obstruction [K83.1] Choledocholithiasis with obstruction [K80.51] Patient Active Problem List   Diagnosis Date Noted  . Choledocholithiasis with obstruction 10/23/2018  . Occipital stroke ([place]) 06/11/2016  . Paroxysmal atrial fibrillation ([place]) 06/11/2016  . 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 Loss of peripheral visual field 03/12/2016  . Ataxia 03/12/2016  . Dizziness and giddiness 03/12/2016  . Cerebrovascular accident (CVA) due to embolism of posterior cerebral artery with infarctions of both occipital lobes ([place]) 03/12/2016  . Chronic diastolic heart failure ([place]) 02/07/2016  . 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 Severe aortic stenosis   . Atrial fibrillation, persistent ([place])   . Mitral regurgitation   . Interstitial lung disease ([place]) 10/30/2014  . Hematochezia 12/13/2013  . CKD (chronic kidney disease), stage III ([place]) 12/13/2013  . Hyperthyroidism 12/13/2013  . GERD (gastroesophageal reflux disease) 12/13/2013  . Long term current use of anticoagulant therapy 12/13/2013  . Elevated brain natriuretic peptide (BNP) level 12/13/2013  . Sinus bradycardia 12/13/2013  . Pneumonia 12/13/2013  . Encounter for  therapeutic drug monitoring 08/29/2013  . Essential hypertension 08/05/2013  . Edema extremities 08/05/2013  . Bradycardia 11/27/2011   PCP:  Jonathon Jordan, MD Pharmacy:   [facility] [address] [place]  - [place] AT [place] [address] [place] [address] Phone: [phone] Fax: [phone]     Social Determinants of Health (SDOH) Interventions    Readmission Risk Interventions No flowsheet data found.

## 2018-10-26 NOTE — Evaluation (Addendum)
Physical Therapy Evaluation Patient Details Name: Alexa Mcdonald MRN: [id number] DOB: 1921/01/10 Today's Date: 10/26/2018   History of Present Illness  85 yo female admitted to ED on 3/20 with emesis and malaise. Pt with ERCP 3/22 finding choledocholithiasis with obstruction early ascending cholangitis. s/p lap chole on 3/23. PMH includes first degree AV block, TAVR 2017, CHF, CKD, diverticulosis, DM, HTN, GERD, orthostatic hypotensioon, ILD, CAD, osteoporosis, afib, occipital CVA 2017, ataxia, edema.     Clinical Impression  Pt presents with generalized LE weakness, increased time and effort to perform mobility tasks, impaired dynamic standing balance, and mild abdominal pain with mobility. Pt to benefit from acute PT to address deficits. Pt ambulated hallway distance with min guard assist for safety, verbal cuing for form and safety with RW provided throughout. Overall, pt performs mobility tasks well with increased time and is steady with use of AD. PT recommending HHPT and [place] aide to address pt mobility and ADL needs. PT also strongly encouraged pt to have son or granddaughter stay with her to assist her as needed initially after d/c, pt states she will talk to son and granddaughter about this. PT to progress mobility as tolerated, and will continue to follow acutely.      Follow Up Recommendations Home health PT;Supervision for mobility/OOB( with home health aide few hours a day to assist with ADLs; contingent on increased assist from son/grandaughter upon d/c home)    Equipment Recommendations  None recommended by PT    Recommendations for Other Services       Precautions / Restrictions Precautions Precautions: Fall Precaution Comments: Pt instructed in log roll to protect abdominal incision and to improve pt comfort.  Restrictions Weight Bearing Restrictions: No      Mobility  Bed Mobility Overal bed mobility: Needs Assistance Bed Mobility: Rolling;Sidelying to Sit Rolling:  Supervision Sidelying to sit: Min assist       General bed mobility comments: Min assist for sidelying to sit for LE management towards EOB, trunk elevation. Pt assisting sitting up by using UEs to push off of bed.   Transfers Overall transfer level: Needs assistance Equipment used: Rolling walker (2 wheeled) Transfers: Sit to/from Stand Sit to Stand: Min guard         General transfer comment: Min guard for safety, verbal cuing to push up to standing with UEs pushing from bed.   Ambulation/Gait Ambulation/Gait assistance: Min guard;+2 safety/equipment Gait Distance (Feet): 125 Feet Assistive device: Rolling walker (2 wheeled) Gait Pattern/deviations: Step-through pattern;Wide base of support;Decreased stride length Gait velocity: slightly decr    General Gait Details: min guard for safety. Frequent verbal cuing to step into RW, pt with tendency to be far outside of RW when ambulating. Increased time to perform turns. Pt reporting mild soreness over abdomen with upright standing and ambulation.   Stairs            Wheelchair Mobility    Modified Rankin (Stroke Patients Only)       Balance Overall balance assessment: Needs assistance Sitting-balance support: No upper extremity supported;Feet supported Sitting balance-Leahy Scale: Good     Standing balance support: Bilateral upper extremity supported Standing balance-Leahy Scale: Fair Standing balance comment: able to stand without UE support, cannot accept challenge                              Pertinent Vitals/Pain Pain Assessment: Faces Faces Pain Scale: Hurts a little bit Pain Location: abdomen  Pain Descriptors / Indicators: Sore Pain Intervention(s): Limited activity within patient's tolerance;Monitored during session;Repositioned    Home Living Family/patient expects to be discharged to:: Private residence Living Arrangements: Alone Available Help at Discharge: Family;Available  PRN/intermittently(son and granddaughter live close by, pt reports having good neighbors that can assist as well) Type of Home: House Home Access: Stairs to enter Entrance Stairs-Rails: Left Entrance Stairs-Number of Steps: 2 Home Layout: One level Home Equipment: Walker - 2 wheels;Cane - single point      Prior Function Level of Independence: Independent         Comments: Pt reports not needing AD, occasionally uses the cane. Pt reports she enjoys her independence, and never has health problems "until now".      Hand Dominance   Dominant Hand: Right    Extremity/Trunk Assessment   Upper Extremity Assessment Upper Extremity Assessment: Overall WFL for tasks assessed    Lower Extremity Assessment Lower Extremity Assessment: Generalized weakness    Cervical / Trunk Assessment Cervical / Trunk Assessment: Normal  Communication   Communication: HOH  Cognition Arousal/Alertness: Awake/alert Behavior During Therapy: WFL for tasks assessed/performed Overall Cognitive Status: Within Functional Limits for tasks assessed                                        General Comments      Exercises     Assessment/Plan    PT Assessment Patient needs continued PT services  PT Problem List Decreased mobility;Decreased strength;Decreased activity tolerance;Decreased balance;Decreased knowledge of use of DME;Pain       PT Treatment Interventions DME instruction;Functional mobility training;Balance training;Gait training;Patient/family education;Therapeutic activities;Stair training;Therapeutic exercise    PT Goals (Current goals can be found in the Care Plan section)  Acute Rehab PT Goals Patient Stated Goal: return to independence  PT Goal Formulation: With patient Time For Goal Achievement: 11/09/18 Potential to Achieve Goals: Good    Frequency Min 3X/week   Barriers to discharge        Co-evaluation               AM-PAC PT "6 Clicks" Mobility   Outcome Measure Help needed turning from your back to your side while in a flat bed without using bedrails?: A Little Help needed moving from lying on your back to sitting on the side of a flat bed without using bedrails?: A Little Help needed moving to and from a bed to a chair (including a wheelchair)?: A Little Help needed standing up from a chair using your arms (e.g., wheelchair or bedside chair)?: A Little Help needed to walk in hospital room?: A Little Help needed climbing 3-5 steps with a railing? : A Little 6 Click Score: 18    End of Session Equipment Utilized During Treatment: Gait belt Activity Tolerance: Patient tolerated treatment well Patient left: in chair;with call bell/phone within reach;with SCD's reapplied(Pt with no posey chair alarm wall monitor in room, RN notified. Pt verbally agrees not to mobilize back to bed until she has pressed call button and waited for RN/NT assist back to bed.) Nurse Communication: Mobility status PT Visit Diagnosis: Other abnormalities of gait and mobility (R26.89);Difficulty in walking, not elsewhere classified (R26.2)    Time: 3664-4034 PT Time Calculation (min) (ACUTE ONLY): 27 min   Charges:   PT Evaluation $PT Eval Low Complexity: 1 Low PT Treatments $Gait Training: 8-22 mins  Julien Girt, PT Acute Rehabilitation Services Pager [phone]  Office [phone]  [place] 10/26/2018, 1:57 PM

## 2018-10-26 NOTE — Consult Note (Signed)
   [facility] Inpatient Consult   10/26/2018  Alexa Mcdonald 04/05/1921 [id number]  Patient screened for potential [facility] [employer] services are needed.  Patient has Medicare and her primary care provider as Dr. Jonathon Jordan.  Patient is a beneficiary of [facility] [employer] services.  Chart reviewed and this information from MD notes is a: Brief History   The patient is a 85 yr old woman who presented to the ED with a complaint of nausea and not feeling well for a few days. She was found to have an elvated creatinine,, AST, ALT, and bilirubin. WBC was 17.4. CT was concerning for choledocholithiasis.   For referral or for questions contact:   [name], RN BSN [facility] Liaison  [phone] business mobile phone Toll free office [phone]

## 2018-10-26 NOTE — Care Management Important Message (Signed)
Important Message  Patient Details  Name: Alexa Mcdonald MRN: [id number] Date of Birth: [birth year]   Medicare Important Message Given:  Yes    Kerin Salen 10/26/2018, 12:40 [place] Message  Patient Details  Name: Alexa Mcdonald MRN: [id number] Date of Birth: January 19, 1921   Medicare Important Message Given:  Yes    Kerin Salen 10/26/2018, 12:40 PM

## 2018-10-26 NOTE — Discharge Summary (Signed)
Physician Discharge Summary  Alexa Mcdonald IOM:[id number] DOB: March 10, 1921 DOA: 10/22/2018  PCP: Jonathon Jordan, MD  Admit date: 10/22/2018 Discharge date: 10/26/2018  Recommendations for Outpatient Follow-up:  1. Follow up with PCP in 7-10 days.  Follow-up Information    Surgery, [place]. Go on 11/09/2018.   Specialty:  General Surgery Why:  Follow up scheduled for 10:30 AM. A provider will call you during scheduled appointment time. Please send a photograph of incisions to [email or web address] the day prior to appointment.  Contact information: [address] [place] [phone]            Discharge Diagnoses: Principal diagnosis is #1 1. Choledocholithiasis: S/P ERCP with removal of stones and laparoscopic cholecystectomy 2. CKD III 3. CAD 4. S/P TAVR 5. Atrial fibrillation 6. hypertension  Discharge Condition: Fair Disposition: Home with home health  Diet recommendation: Heart healthy  Filed Weights   10/23/18 0223 10/24/18 0526 10/26/18 0552  Weight: 60.4 kg 62.8 kg 65.2 kg    History of present illness: Alexa Mcdonald is a 85 y.o. female with history of A. fib, chronic LBBB, nonobstructive CAD, chronic kidney disease was brought to the ER by patient's son after patient was complaining of not feeling well and nauseous over the last few days.  Had no vomiting or abdominal pain no fever or chills or chest pain has been feeling weak.  Hospital Course: The patient is a 85 yr old woman who presented to the ED with a complaint of nausea and not feeling well for a few days. She was found to have an elvated creatinine,, AST, ALT, and bilirubin. WBC was 17.4. CT was concerning for choledocholithiasis.   The patient has been admitted to a medical bed. GI was consulted. She has been started on cefepime and flagyl. Blood cultures have been obtained. No growth thus far. ERCP on 10/24/2018 with sphincterotomy, sphincteroplasty,  and stone extraction. It demonstrated 4 stones, 2 of which were large.   General surgery was consulted for cholecystitis. She underwent laparoscopic cholecystectomy on 10/25/2018. She tolerated the procedure well. She is discharged to home today with family and home health.  Today's assessment: S: The patient is sitting up at bedside. No new complaints. O: Vitals:  Vitals:   10/26/18 0556 10/26/18 1300  BP: 140/65 (!) 145/72  Pulse: (!) 52 67  Resp: 16 16  Temp: 98.9 F (37.2 C) 98.7 F (37.1 C)  SpO2: 98% 98%    Constitutional:   The patient is awake, alert, and oriented x 3. No acute distress. Respiratory:   No wheezes, rales, or rhonchi.   No tactile fremitus  No increased work of breathing. Cardiovascular:   Regular rate and rhythm.   No murmurs, ectopy, or gallups.  No LE extremity edema    Normal pedal pulses Abdomen:   Abdomen is soft, non-tender, non-distended.  No hernias, masses, or organomegaly.  Normoactive bowel sounds. Musculoskeletal:   No cyanosis, clubbing or edema. Skin:   No rashes, lesions, ulcers  palpation of skin: no induration or nodules Neurologic:   CN 2-12 intact  Sensation all 4 extremities intact Psychiatric:   judgement and insight appear normal  Mental status o Mood, affect appropriate o Orientation to person, place, time     Discharge Instructions  Discharge Instructions    Activity as tolerated - No restrictions   Complete by:  As directed    Call MD for:  persistant nausea and vomiting   Complete by:  As  directed    Call MD for:  persistant nausea and vomiting   Complete by:  As directed    Call MD for:  severe uncontrolled pain   Complete by:  As directed    Call MD for:  severe uncontrolled pain   Complete by:  As directed    Diet - low sodium heart healthy   Complete by:  As directed    Diet - low sodium heart healthy   Complete by:  As directed    Increase activity slowly   Complete by:  As  directed      Allergies as of 10/26/2018      Reactions   Amoxicillin Other (See Comments)   tongue felt bruised Did it involve swelling of the face/tongue/throat, SOB, or low BP? No Did it involve sudden or severe rash/hives, skin peeling, or any reaction on the inside of your mouth or nose? No Did you need to seek medical attention at a hospital or doctor's office? No When did it last happen?childhood If all above answers are NO, may proceed with cephalosporin use.   Celebrex [celecoxib] Other (See Comments)   Mouth sores      Medication List    STOP taking these medications   clindamycin 300 MG capsule Commonly known as:  CLEOCIN     TAKE these medications   acetaminophen 325 MG tablet Commonly known as:  TYLENOL Take 2 tablets (650 mg total) by mouth every 6 (six) hours as needed for mild pain.   aspirin EC 81 MG tablet Take 81 mg by mouth daily.   atorvastatin 10 MG tablet Commonly known as:  LIPITOR Take 10 mg daily by mouth.   b complex vitamins tablet Take 1 tablet by mouth daily.   CALCIUM PO Take 1 tablet by mouth daily.   cephALEXin 250 MG capsule Commonly known as:  KEFLEX Take 1 capsule (250 mg total) by mouth 4 (four) times daily for 4 days.   edoxaban 30 MG Tabs tablet Commonly known as:  SAVAYSA Take 1 tablet (30 mg total) by mouth daily. Start taking on:  October 28, 2018 What changed:  These instructions start on October 28, 2018. If you are unsure what to do until then, ask your doctor or other care provider.   lisinopril 10 MG tablet Commonly known as:  PRINIVIL,ZESTRIL Take 1 tablet (10 mg total) by mouth daily. Please call our office to schedule an appt for future refills (2nd attempt)   methimazole 10 MG tablet Commonly known as:  TAPAZOLE Take 5 mg by mouth daily.   metroNIDAZOLE 500 MG tablet Commonly known as:  Flagyl Take 1 tablet (500 mg total) by mouth 3 (three) times daily for 4 days.   OCUVITE PRESERVISION PO Take 1  capsule by mouth daily.   Restasis Multidose 0.05 % ophthalmic emulsion Generic drug:  cycloSPORINE Apply 1 drop to eye daily.   traMADol 50 MG tablet Commonly known as:  ULTRAM Take 1 tablet (50 mg total) by mouth every 6 (six) hours as needed for moderate pain.   VITAMIN D PO Take 1 tablet by mouth daily.      Allergies  Allergen Reactions   Amoxicillin Other (See Comments)    tongue felt bruised Did it involve swelling of the face/tongue/throat, SOB, or low BP? No Did it involve sudden or severe rash/hives, skin peeling, or any reaction on the inside of your mouth or nose? No Did you need to seek medical attention at a hospital  or doctor's office? No When did it last happen?childhood If all above answers are NO, may proceed with cephalosporin use.     Celebrex [Celecoxib] Other (See Comments)    Mouth sores    The results of significant diagnostics from this hospitalization (including imaging, microbiology, ancillary and laboratory) are listed below for reference.    Significant Diagnostic Studies: Ct Abdomen Pelvis Wo Contrast  Result Date: 10/22/2018 CLINICAL DATA:  Nausea and loss of appetite. EXAM: CT ABDOMEN AND PELVIS WITHOUT CONTRAST TECHNIQUE: Multidetector CT imaging of the abdomen and pelvis was performed following the standard protocol without IV contrast. COMPARISON:  Body CT 10/24/2015 FINDINGS: Lower chest: Chronic interstitial lung changes. Enlarged of the cardiac silhouette. Heavy calcifications of the mitral valve annulus. Artificial aortic valve noted. Hepatobiliary: Is normal appearance of the liver. In the gallbladder as dilated. There is dilation of the extrahepatic common bile duct as well. High density material within the distal common bile duct may represent obstructive choledocholithiasis. Pancreas: Unremarkable. No pancreatic ductal dilatation or surrounding inflammatory changes. Spleen: Normal in size without focal abnormality.  Adrenals/Urinary Tract: Adrenal glands are unremarkable. Bilateral renal cortical thinning and perinephric fat stranding, nonspecific. Kidneys are without renal calculi, focal lesion, or hydronephrosis. Bladder is unremarkable. Stomach/Bowel: Stomach is within normal limits. No evidence of bowel wall thickening, distention, or inflammatory changes. Diffuse colonic diverticulosis without evidence of diverticulitis. Vascular/Lymphatic: Aortic atherosclerosis. No enlarged abdominal or pelvic lymph nodes. Reproductive: Uterus and bilateral adnexa are unremarkable. Other: No abdominal wall hernia or abnormality. No abdominopelvic ascites. Musculoskeletal: No acute or significant osseous findings. IMPRESSION: 1. Dilation of the gallbladder and common bile duct with high density material within the distal common bile duct which is suspicious for obstructive choledocholithiasis. 2. Diffuse colonic diverticulosis without evidence of diverticulitis. Electronically Signed   By: Fidela Salisbury M.D.   On: 10/22/2018 22:56   Dg Ercp With Sphincterotomy  Result Date: 10/24/2018 CLINICAL DATA:  Cholangitis, probable choledocholithiasis EXAM: ERCP TECHNIQUE: Multiple spot images obtained with the fluoroscopic device and submitted for interpretation post-procedure. COMPARISON:  CT 10/22/2018 FINDINGS: A series of fluoroscopic spot images document endoscopic cannulation and opacification of the CBD. Filling defects within the proximal CBD which is distended. Incomplete opacification of the biliary tree, which appears mildly distended centrally. Subsequent images document balloon catheter passage through the CBD with apparent clearance of filling defects. No evidence of extravasation. IMPRESSION: Endoscopic CBD cannulation and intervention. These images were submitted for radiologic interpretation only. Please see the procedural report for the amount of contrast and the fluoroscopy time utilized. Electronically Signed   By: Lucrezia Europe M.D.   On: 10/24/2018 14:25    Microbiology: Recent Results (from the past 240 hour(s))  Culture, blood (routine x 2)     Status: None (Preliminary result)   Collection Time: 10/23/18  3:59 PM  Result Value Ref Range Status   Specimen Description   Final    BLOOD LEFT HAND Performed at [facility], [place] [address], [place], [place] [address]    Special Requests   Final    BOTTLES DRAWN AEROBIC ONLY Blood Culture adequate volume Performed at [place] [address], [place], [place] [address]    Culture   Final    NO GROWTH 3 DAYS Performed at [facility], [place] [address], [place], [place] [address]    Report Status PENDING  Incomplete  Culture, blood (routine x 2)     Status: None (Preliminary result)  Collection Time: 10/23/18  4:04 PM  Result Value Ref Range Status   Specimen Description   Final    BLOOD RIGHT HAND Performed at [facility], [place] [address], [place], [place] [address]    Special Requests   Final    BOTTLES DRAWN AEROBIC ONLY Blood Culture adequate volume Performed at [place] [address], [place], [place] [address]    Culture   Final    NO GROWTH 3 DAYS Performed at [facility], [place] [address], [place], [place] [address]    Report Status PENDING  Incomplete  Surgical pcr screen     Status: None   Collection Time: 10/25/18  6:06 AM  Result Value Ref Range Status   MRSA, PCR NEGATIVE NEGATIVE Final   Staphylococcus aureus NEGATIVE NEGATIVE Final    Comment: (NOTE) The Xpert SA Assay (FDA approved for NASAL specimens in patients 17 years of age and older), is one component of a comprehensive surveillance program. It is not intended to diagnose infection nor to guide or monitor treatment. Performed at [facility], [place] [address], [place], [place] [address]      Labs: Basic Metabolic Panel: Recent Labs  Lab  10/22/18 2036 10/23/18 0341 10/24/18 0502 10/25/18 0823 10/26/18 0457  NA 138 137 137 138 139  K 4.2 3.9 3.8 3.7 3.6  CL 102 105 108 111 112*  CO2 23 23 21* 17* 17*  GLUCOSE 209* 291* 157* 181* 125*  BUN 27* 27* 29* 35* 32*  CREATININE 1.42* 1.28* 1.34* 1.52* 1.29*  CALCIUM 9.1 8.8* 8.2* 7.6* 7.7*   Liver Function Tests: Recent Labs  Lab 10/22/18 2036 10/23/18 0341 10/24/18 0502 10/25/18 0823 10/26/18 0457  AST 366* 790* 305* 94* 66*  ALT 185* 538* 436* 249* 186*  ALKPHOS 124 123 125 135* 122  BILITOT 2.6* 3.9* 3.6* 1.7* 1.5*  PROT 7.4 6.0* 5.9* 5.6* 5.5*  ALBUMIN 4.3 3.5 3.2* 2.9* 2.9*   Recent Labs  Lab 10/22/18 2036  LIPASE 30   No results for input(s): AMMONIA in the last 168 hours. CBC: Recent Labs  Lab 10/22/18 2036 10/23/18 0341 10/24/18 0502 10/25/18 0823 10/26/18 0457  WBC 17.4* 20.2* 12.9* 10.1 10.4  NEUTROABS  --  18.7* 11.3* 8.8* 9.0*  HGB 14.3 12.8 12.0 12.0 11.8*  HCT 43.9 41.9 37.0 38.1 37.9  MCV 94.6 98.1 93.2 94.8 95.7  PLT 233 171 156 164 183   Cardiac Enzymes: No results for input(s): CKTOTAL, CKMB, CKMBINDEX, TROPONINI in the last 168 hours. BNP: BNP (last 3 results) No results for input(s): BNP in the last 8760 hours.  ProBNP (last 3 results) No results for input(s): PROBNP in the last 8760 hours.  CBG: Recent Labs  Lab 10/24/18 1453 10/25/18 1550  GLUCAP 149* 155*    Principal Problem:   Choledocholithiasis with obstruction Active Problems:   Essential hypertension   CKD (chronic kidney disease), stage III (HCC)   Interstitial lung disease (HCC)   S/P TAVR (transcatheter aortic valve replacement)   Paroxysmal atrial fibrillation ([place])   Time coordinating discharge: 38 minutes.  Signed:        Antonius Hartlage, DO [facility]  10/26/2018, 4:00 PM

## 2018-10-26 NOTE — Progress Notes (Signed)
Pt discharged with [facility] and with family.  Spoke with pt's daughter Leda Gauze [phone] concerning discharge plans and HH needs. Leda Gauze agreed with [facility], she and her daughter are staying with pt at discharge and between [facility].

## 2018-10-27 DIAGNOSIS — Z9841 Cataract extraction status, right eye: Secondary | ICD-10-CM | POA: Diagnosis not present

## 2018-10-27 DIAGNOSIS — R001 Bradycardia, unspecified: Secondary | ICD-10-CM | POA: Diagnosis not present

## 2018-10-27 DIAGNOSIS — M8580 Other specified disorders of bone density and structure, unspecified site: Secondary | ICD-10-CM | POA: Diagnosis not present

## 2018-10-27 DIAGNOSIS — K8051 Calculus of bile duct without cholangitis or cholecystitis with obstruction: Secondary | ICD-10-CM | POA: Diagnosis not present

## 2018-10-27 DIAGNOSIS — I480 Paroxysmal atrial fibrillation: Secondary | ICD-10-CM | POA: Diagnosis not present

## 2018-10-27 DIAGNOSIS — Z48815 Encounter for surgical aftercare following surgery on the digestive system: Secondary | ICD-10-CM | POA: Diagnosis not present

## 2018-10-27 DIAGNOSIS — Z7982 Long term (current) use of aspirin: Secondary | ICD-10-CM | POA: Diagnosis not present

## 2018-10-27 DIAGNOSIS — I052 Rheumatic mitral stenosis with insufficiency: Secondary | ICD-10-CM | POA: Diagnosis not present

## 2018-10-27 DIAGNOSIS — J849 Interstitial pulmonary disease, unspecified: Secondary | ICD-10-CM | POA: Diagnosis not present

## 2018-10-27 DIAGNOSIS — E1122 Type 2 diabetes mellitus with diabetic chronic kidney disease: Secondary | ICD-10-CM | POA: Diagnosis not present

## 2018-10-27 DIAGNOSIS — E559 Vitamin D deficiency, unspecified: Secondary | ICD-10-CM | POA: Diagnosis not present

## 2018-10-27 DIAGNOSIS — M810 Age-related osteoporosis without current pathological fracture: Secondary | ICD-10-CM | POA: Diagnosis not present

## 2018-10-27 DIAGNOSIS — N183 Chronic kidney disease, stage 3 (moderate): Secondary | ICD-10-CM | POA: Diagnosis not present

## 2018-10-27 DIAGNOSIS — K219 Gastro-esophageal reflux disease without esophagitis: Secondary | ICD-10-CM | POA: Diagnosis not present

## 2018-10-27 DIAGNOSIS — I440 Atrioventricular block, first degree: Secondary | ICD-10-CM | POA: Diagnosis not present

## 2018-10-27 DIAGNOSIS — I495 Sick sinus syndrome: Secondary | ICD-10-CM | POA: Diagnosis not present

## 2018-10-27 DIAGNOSIS — Z8673 Personal history of transient ischemic attack (TIA), and cerebral infarction without residual deficits: Secondary | ICD-10-CM | POA: Diagnosis not present

## 2018-10-27 DIAGNOSIS — H9193 Unspecified hearing loss, bilateral: Secondary | ICD-10-CM | POA: Diagnosis not present

## 2018-10-27 DIAGNOSIS — K5790 Diverticulosis of intestine, part unspecified, without perforation or abscess without bleeding: Secondary | ICD-10-CM | POA: Diagnosis not present

## 2018-10-27 DIAGNOSIS — I130 Hypertensive heart and chronic kidney disease with heart failure and stage 1 through stage 4 chronic kidney disease, or unspecified chronic kidney disease: Secondary | ICD-10-CM | POA: Diagnosis not present

## 2018-10-27 DIAGNOSIS — Z952 Presence of prosthetic heart valve: Secondary | ICD-10-CM | POA: Diagnosis not present

## 2018-10-27 DIAGNOSIS — I5032 Chronic diastolic (congestive) heart failure: Secondary | ICD-10-CM | POA: Diagnosis not present

## 2018-10-27 DIAGNOSIS — I447 Left bundle-branch block, unspecified: Secondary | ICD-10-CM | POA: Diagnosis not present

## 2018-10-27 DIAGNOSIS — Z8701 Personal history of pneumonia (recurrent): Secondary | ICD-10-CM | POA: Diagnosis not present

## 2018-10-27 DIAGNOSIS — I2510 Atherosclerotic heart disease of native coronary artery without angina pectoris: Secondary | ICD-10-CM | POA: Diagnosis not present

## 2018-10-28 DIAGNOSIS — I130 Hypertensive heart and chronic kidney disease with heart failure and stage 1 through stage 4 chronic kidney disease, or unspecified chronic kidney disease: Secondary | ICD-10-CM | POA: Diagnosis not present

## 2018-10-28 DIAGNOSIS — Z48815 Encounter for surgical aftercare following surgery on the digestive system: Secondary | ICD-10-CM | POA: Diagnosis not present

## 2018-10-28 DIAGNOSIS — I5032 Chronic diastolic (congestive) heart failure: Secondary | ICD-10-CM | POA: Diagnosis not present

## 2018-10-28 DIAGNOSIS — N183 Chronic kidney disease, stage 3 (moderate): Secondary | ICD-10-CM | POA: Diagnosis not present

## 2018-10-28 DIAGNOSIS — E1122 Type 2 diabetes mellitus with diabetic chronic kidney disease: Secondary | ICD-10-CM | POA: Diagnosis not present

## 2018-10-28 DIAGNOSIS — K8051 Calculus of bile duct without cholangitis or cholecystitis with obstruction: Secondary | ICD-10-CM | POA: Diagnosis not present

## 2018-10-28 LAB — CULTURE, BLOOD (ROUTINE X 2)
Culture: NO GROWTH
Culture: NO GROWTH
Special Requests: ADEQUATE
Special Requests: ADEQUATE

## 2018-10-29 DIAGNOSIS — K8051 Calculus of bile duct without cholangitis or cholecystitis with obstruction: Secondary | ICD-10-CM | POA: Diagnosis not present

## 2018-10-29 DIAGNOSIS — Z48815 Encounter for surgical aftercare following surgery on the digestive system: Secondary | ICD-10-CM | POA: Diagnosis not present

## 2018-10-29 DIAGNOSIS — E1122 Type 2 diabetes mellitus with diabetic chronic kidney disease: Secondary | ICD-10-CM | POA: Diagnosis not present

## 2018-10-29 DIAGNOSIS — N183 Chronic kidney disease, stage 3 (moderate): Secondary | ICD-10-CM | POA: Diagnosis not present

## 2018-10-29 DIAGNOSIS — I130 Hypertensive heart and chronic kidney disease with heart failure and stage 1 through stage 4 chronic kidney disease, or unspecified chronic kidney disease: Secondary | ICD-10-CM | POA: Diagnosis not present

## 2018-10-29 DIAGNOSIS — I5032 Chronic diastolic (congestive) heart failure: Secondary | ICD-10-CM | POA: Diagnosis not present

## 2018-11-01 DIAGNOSIS — K8051 Calculus of bile duct without cholangitis or cholecystitis with obstruction: Secondary | ICD-10-CM | POA: Diagnosis not present

## 2018-11-01 DIAGNOSIS — N183 Chronic kidney disease, stage 3 (moderate): Secondary | ICD-10-CM | POA: Diagnosis not present

## 2018-11-01 DIAGNOSIS — Z48815 Encounter for surgical aftercare following surgery on the digestive system: Secondary | ICD-10-CM | POA: Diagnosis not present

## 2018-11-01 DIAGNOSIS — I5032 Chronic diastolic (congestive) heart failure: Secondary | ICD-10-CM | POA: Diagnosis not present

## 2018-11-01 DIAGNOSIS — I130 Hypertensive heart and chronic kidney disease with heart failure and stage 1 through stage 4 chronic kidney disease, or unspecified chronic kidney disease: Secondary | ICD-10-CM | POA: Diagnosis not present

## 2018-11-01 DIAGNOSIS — E1122 Type 2 diabetes mellitus with diabetic chronic kidney disease: Secondary | ICD-10-CM | POA: Diagnosis not present

## 2018-11-02 ENCOUNTER — Other Ambulatory Visit: Payer: Self-pay | Admitting: Pharmacist

## 2018-11-02 MED ORDER — EDOXABAN TOSYLATE 30 MG PO TABS: 30.0000 mg | ORAL_TABLET | Freq: Every day | ORAL | 5 refills | Status: DC

## 2018-11-03 ENCOUNTER — Telehealth: Payer: Self-pay

## 2018-11-03 NOTE — Telephone Encounter (Signed)
**Note De-Identified  Obfuscation** I called [employer] and did a Savaysa PA over the phone with Maudie Mercury. Per Maudie Mercury they will fax [place] their determination within 72 hours.

## 2018-11-04 DIAGNOSIS — Z48815 Encounter for surgical aftercare following surgery on the digestive system: Secondary | ICD-10-CM | POA: Diagnosis not present

## 2018-11-04 DIAGNOSIS — N183 Chronic kidney disease, stage 3 (moderate): Secondary | ICD-10-CM | POA: Diagnosis not present

## 2018-11-04 DIAGNOSIS — I5032 Chronic diastolic (congestive) heart failure: Secondary | ICD-10-CM | POA: Diagnosis not present

## 2018-11-04 DIAGNOSIS — K8051 Calculus of bile duct without cholangitis or cholecystitis with obstruction: Secondary | ICD-10-CM | POA: Diagnosis not present

## 2018-11-04 DIAGNOSIS — E1122 Type 2 diabetes mellitus with diabetic chronic kidney disease: Secondary | ICD-10-CM | POA: Diagnosis not present

## 2018-11-04 DIAGNOSIS — I130 Hypertensive heart and chronic kidney disease with heart failure and stage 1 through stage 4 chronic kidney disease, or unspecified chronic kidney disease: Secondary | ICD-10-CM | POA: Diagnosis not present

## 2018-11-04 NOTE — Telephone Encounter (Signed)
**Note De-Identified  Obfuscation** We received a form from [employer] for additional information on the pts Savaysa PA. I called [employer] and s/w Alexa Mcdonald who stated that I need to call the appeal dept at [phone]. I s/w Alexa Mcdonald in appeals but she states that the pt does not have [place] and state plan and that I need to contact the pt for info from her card.  I called the pt but she could not help me and put her CNA Alexa Mcdonald on the phone. Alexa Mcdonald states that she cannot go into the pts wallet to obtain insurance info and that she will have [place] or the pts son call me back. The pt gave permission for me to talk with [place] or her son concerning her care/insurance.

## 2018-11-05 DIAGNOSIS — N183 Chronic kidney disease, stage 3 (moderate): Secondary | ICD-10-CM | POA: Diagnosis not present

## 2018-11-05 DIAGNOSIS — Z48815 Encounter for surgical aftercare following surgery on the digestive system: Secondary | ICD-10-CM | POA: Diagnosis not present

## 2018-11-05 DIAGNOSIS — K8051 Calculus of bile duct without cholangitis or cholecystitis with obstruction: Secondary | ICD-10-CM | POA: Diagnosis not present

## 2018-11-05 DIAGNOSIS — I130 Hypertensive heart and chronic kidney disease with heart failure and stage 1 through stage 4 chronic kidney disease, or unspecified chronic kidney disease: Secondary | ICD-10-CM | POA: Diagnosis not present

## 2018-11-05 DIAGNOSIS — E1122 Type 2 diabetes mellitus with diabetic chronic kidney disease: Secondary | ICD-10-CM | POA: Diagnosis not present

## 2018-11-05 DIAGNOSIS — I5032 Chronic diastolic (congestive) heart failure: Secondary | ICD-10-CM | POA: Diagnosis not present

## 2018-11-05 NOTE — Telephone Encounter (Signed)
**Note De-Identified  Obfuscation** Will forward this message to April Garrison, CMA for her assistance with filling the form out and faxing back to [employer] along with notes explaining why Dr [name] wants the pt to take [facility]. (note can be found in my staff messages and it is from [place], [facility])

## 2018-11-08 DIAGNOSIS — Z48815 Encounter for surgical aftercare following surgery on the digestive system: Secondary | ICD-10-CM | POA: Diagnosis not present

## 2018-11-08 DIAGNOSIS — E1122 Type 2 diabetes mellitus with diabetic chronic kidney disease: Secondary | ICD-10-CM | POA: Diagnosis not present

## 2018-11-08 DIAGNOSIS — I5032 Chronic diastolic (congestive) heart failure: Secondary | ICD-10-CM | POA: Diagnosis not present

## 2018-11-08 DIAGNOSIS — K8051 Calculus of bile duct without cholangitis or cholecystitis with obstruction: Secondary | ICD-10-CM | POA: Diagnosis not present

## 2018-11-08 DIAGNOSIS — N183 Chronic kidney disease, stage 3 (moderate): Secondary | ICD-10-CM | POA: Diagnosis not present

## 2018-11-08 DIAGNOSIS — I130 Hypertensive heart and chronic kidney disease with heart failure and stage 1 through stage 4 chronic kidney disease, or unspecified chronic kidney disease: Secondary | ICD-10-CM | POA: Diagnosis not present

## 2018-11-08 NOTE — Telephone Encounter (Signed)
Drug Denial appeal form has been completed and faxed back to [employer] with Dr York Cerise note.

## 2018-11-09 ENCOUNTER — Emergency Department (HOSPITAL_COMMUNITY): Payer: Medicare Other

## 2018-11-09 ENCOUNTER — Encounter (HOSPITAL_COMMUNITY): Payer: Self-pay | Admitting: *Deleted

## 2018-11-09 ENCOUNTER — Telehealth: Payer: Self-pay | Admitting: Cardiovascular Disease

## 2018-11-09 ENCOUNTER — Inpatient Hospital Stay (HOSPITAL_COMMUNITY)
Admission: EM | Admit: 2018-11-09 | Discharge: 2018-11-13 | DRG: 377 | Disposition: A | Discharge status: Home Health | Payer: Medicare Other | Attending: Internal Medicine | Admitting: Internal Medicine

## 2018-11-09 DIAGNOSIS — I340 Nonrheumatic mitral (valve) insufficiency: Secondary | ICD-10-CM | POA: Diagnosis present

## 2018-11-09 DIAGNOSIS — K2951 Unspecified chronic gastritis with bleeding: Secondary | ICD-10-CM | POA: Diagnosis present

## 2018-11-09 DIAGNOSIS — Z974 Presence of external hearing-aid: Secondary | ICD-10-CM

## 2018-11-09 DIAGNOSIS — Z7982 Long term (current) use of aspirin: Secondary | ICD-10-CM

## 2018-11-09 DIAGNOSIS — J849 Interstitial pulmonary disease, unspecified: Secondary | ICD-10-CM | POA: Diagnosis present

## 2018-11-09 DIAGNOSIS — Z8249 Family history of ischemic heart disease and other diseases of the circulatory system: Secondary | ICD-10-CM

## 2018-11-09 DIAGNOSIS — K219 Gastro-esophageal reflux disease without esophagitis: Secondary | ICD-10-CM | POA: Diagnosis not present

## 2018-11-09 DIAGNOSIS — Z833 Family history of diabetes mellitus: Secondary | ICD-10-CM

## 2018-11-09 DIAGNOSIS — M810 Age-related osteoporosis without current pathological fracture: Secondary | ICD-10-CM | POA: Diagnosis present

## 2018-11-09 DIAGNOSIS — Z886 Allergy status to analgesic agent status: Secondary | ICD-10-CM

## 2018-11-09 DIAGNOSIS — E039 Hypothyroidism, unspecified: Secondary | ICD-10-CM | POA: Diagnosis present

## 2018-11-09 DIAGNOSIS — Z515 Encounter for palliative care: Secondary | ICD-10-CM | POA: Diagnosis not present

## 2018-11-09 DIAGNOSIS — I130 Hypertensive heart and chronic kidney disease with heart failure and stage 1 through stage 4 chronic kidney disease, or unspecified chronic kidney disease: Secondary | ICD-10-CM | POA: Diagnosis not present

## 2018-11-09 DIAGNOSIS — K922 Gastrointestinal hemorrhage, unspecified: Secondary | ICD-10-CM | POA: Diagnosis present

## 2018-11-09 DIAGNOSIS — N183 Chronic kidney disease, stage 3 (moderate): Secondary | ICD-10-CM | POA: Diagnosis not present

## 2018-11-09 DIAGNOSIS — I5033 Acute on chronic diastolic (congestive) heart failure: Secondary | ICD-10-CM | POA: Diagnosis present

## 2018-11-09 DIAGNOSIS — M25472 Effusion, left ankle: Secondary | ICD-10-CM

## 2018-11-09 DIAGNOSIS — Z79899 Other long term (current) drug therapy: Secondary | ICD-10-CM

## 2018-11-09 DIAGNOSIS — Z7901 Long term (current) use of anticoagulants: Secondary | ICD-10-CM | POA: Diagnosis not present

## 2018-11-09 DIAGNOSIS — E1122 Type 2 diabetes mellitus with diabetic chronic kidney disease: Secondary | ICD-10-CM | POA: Diagnosis present

## 2018-11-09 DIAGNOSIS — Z880 Allergy status to penicillin: Secondary | ICD-10-CM

## 2018-11-09 DIAGNOSIS — I447 Left bundle-branch block, unspecified: Secondary | ICD-10-CM | POA: Diagnosis present

## 2018-11-09 DIAGNOSIS — D509 Iron deficiency anemia, unspecified: Secondary | ICD-10-CM | POA: Diagnosis not present

## 2018-11-09 DIAGNOSIS — M25471 Effusion, right ankle: Secondary | ICD-10-CM | POA: Diagnosis not present

## 2018-11-09 DIAGNOSIS — Z9889 Other specified postprocedural states: Secondary | ICD-10-CM | POA: Diagnosis not present

## 2018-11-09 DIAGNOSIS — K269 Duodenal ulcer, unspecified as acute or chronic, without hemorrhage or perforation: Secondary | ICD-10-CM | POA: Diagnosis present

## 2018-11-09 DIAGNOSIS — I440 Atrioventricular block, first degree: Secondary | ICD-10-CM | POA: Diagnosis present

## 2018-11-09 DIAGNOSIS — E119 Type 2 diabetes mellitus without complications: Secondary | ICD-10-CM | POA: Diagnosis not present

## 2018-11-09 DIAGNOSIS — K254 Chronic or unspecified gastric ulcer with hemorrhage: Secondary | ICD-10-CM | POA: Diagnosis present

## 2018-11-09 DIAGNOSIS — N179 Acute kidney failure, unspecified: Secondary | ICD-10-CM | POA: Diagnosis not present

## 2018-11-09 DIAGNOSIS — E785 Hyperlipidemia, unspecified: Secondary | ICD-10-CM | POA: Diagnosis present

## 2018-11-09 DIAGNOSIS — I4819 Other persistent atrial fibrillation: Secondary | ICD-10-CM | POA: Diagnosis present

## 2018-11-09 DIAGNOSIS — D62 Acute posthemorrhagic anemia: Secondary | ICD-10-CM | POA: Diagnosis present

## 2018-11-09 DIAGNOSIS — D500 Iron deficiency anemia secondary to blood loss (chronic): Secondary | ICD-10-CM

## 2018-11-09 DIAGNOSIS — Z9049 Acquired absence of other specified parts of digestive tract: Secondary | ICD-10-CM

## 2018-11-09 DIAGNOSIS — K2960 Other gastritis without bleeding: Secondary | ICD-10-CM

## 2018-11-09 DIAGNOSIS — I2510 Atherosclerotic heart disease of native coronary artery without angina pectoris: Secondary | ICD-10-CM | POA: Diagnosis present

## 2018-11-09 DIAGNOSIS — Z953 Presence of xenogenic heart valve: Secondary | ICD-10-CM | POA: Diagnosis not present

## 2018-11-09 DIAGNOSIS — I4891 Unspecified atrial fibrillation: Secondary | ICD-10-CM | POA: Diagnosis not present

## 2018-11-09 DIAGNOSIS — K3189 Other diseases of stomach and duodenum: Secondary | ICD-10-CM | POA: Diagnosis not present

## 2018-11-09 DIAGNOSIS — H9193 Unspecified hearing loss, bilateral: Secondary | ICD-10-CM | POA: Diagnosis present

## 2018-11-09 DIAGNOSIS — E559 Vitamin D deficiency, unspecified: Secondary | ICD-10-CM | POA: Diagnosis present

## 2018-11-09 DIAGNOSIS — R600 Localized edema: Secondary | ICD-10-CM

## 2018-11-09 DIAGNOSIS — I10 Essential (primary) hypertension: Secondary | ICD-10-CM | POA: Diagnosis not present

## 2018-11-09 DIAGNOSIS — D649 Anemia, unspecified: Secondary | ICD-10-CM | POA: Diagnosis not present

## 2018-11-09 DIAGNOSIS — E0500 Thyrotoxicosis with diffuse goiter without thyrotoxic crisis or storm: Secondary | ICD-10-CM | POA: Diagnosis present

## 2018-11-09 DIAGNOSIS — Z952 Presence of prosthetic heart valve: Secondary | ICD-10-CM | POA: Diagnosis not present

## 2018-11-09 DIAGNOSIS — R001 Bradycardia, unspecified: Secondary | ICD-10-CM | POA: Diagnosis not present

## 2018-11-09 DIAGNOSIS — K2970 Gastritis, unspecified, without bleeding: Secondary | ICD-10-CM | POA: Diagnosis not present

## 2018-11-09 DIAGNOSIS — R609 Edema, unspecified: Secondary | ICD-10-CM | POA: Diagnosis not present

## 2018-11-09 DIAGNOSIS — Z7189 Other specified counseling: Secondary | ICD-10-CM | POA: Diagnosis not present

## 2018-11-09 DIAGNOSIS — Z8673 Personal history of transient ischemic attack (TIA), and cerebral infarction without residual deficits: Secondary | ICD-10-CM

## 2018-11-09 LAB — COMPREHENSIVE METABOLIC PANEL
ALT: 19 U/L (ref 0–44)
AST: 17 U/L (ref 15–41)
Albumin: 3.2 g/dL — ABNORMAL LOW (ref 3.5–5.0)
Alkaline Phosphatase: 84 U/L (ref 38–126)
Anion gap: 9 (ref 5–15)
BUN: 18 mg/dL (ref 8–23)
CO2: 24 mmol/L (ref 22–32)
Calcium: 8.3 mg/dL — ABNORMAL LOW (ref 8.9–10.3)
Chloride: 103 mmol/L (ref 98–111)
Creatinine, Ser: 1.28 mg/dL — ABNORMAL HIGH (ref 0.44–1.00)
GFR calc Af Amer: 41 mL/min — ABNORMAL LOW (ref >60)
GFR calc non Af Amer: 36 mL/min — ABNORMAL LOW (ref >60)
Glucose, Bld: 147 mg/dL — ABNORMAL HIGH (ref 70–99)
Potassium: 4.2 mmol/L (ref 3.5–5.1)
Sodium: 136 mmol/L (ref 135–145)
Total Bilirubin: 1.1 mg/dL (ref 0.3–1.2)
Total Protein: 6.2 g/dL — ABNORMAL LOW (ref 6.5–8.1)

## 2018-11-09 LAB — CBC WITH DIFFERENTIAL/PLATELET
Abs Immature Granulocytes: 0.08 10*3/uL — ABNORMAL HIGH (ref 0.00–0.07)
Basophils Absolute: 0 10*3/uL (ref 0.0–0.1)
Basophils Relative: 0 %
Eosinophils Absolute: 0 10*3/uL (ref 0.0–0.5)
Eosinophils Relative: 0 %
HCT: 24.8 % — ABNORMAL LOW (ref 36.0–46.0)
Hemoglobin: 7.6 g/dL — ABNORMAL LOW (ref 12.0–15.0)
Immature Granulocytes: 1 %
Lymphocytes Relative: 7 %
Lymphs Abs: 0.6 10*3/uL — ABNORMAL LOW (ref 0.7–4.0)
MCH: 29.1 pg (ref 26.0–34.0)
MCHC: 30.6 g/dL (ref 30.0–36.0)
MCV: 95 fL (ref 80.0–100.0)
Monocytes Absolute: 0.6 10*3/uL (ref 0.1–1.0)
Monocytes Relative: 7 %
Neutro Abs: 8 10*3/uL — ABNORMAL HIGH (ref 1.7–7.7)
Neutrophils Relative %: 85 %
Platelets: 568 10*3/uL — ABNORMAL HIGH (ref 150–400)
RBC: 2.61 MIL/uL — ABNORMAL LOW (ref 3.87–5.11)
RDW: 17 % — ABNORMAL HIGH (ref 11.5–15.5)
WBC: 9.4 10*3/uL (ref 4.0–10.5)
nRBC: 0 % (ref 0.0–0.2)

## 2018-11-09 LAB — PREPARE RBC (CROSSMATCH)

## 2018-11-09 LAB — HEMOGLOBIN AND HEMATOCRIT, BLOOD
HCT: 24.3 % — ABNORMAL LOW (ref 36.0–46.0)
Hemoglobin: 7.6 g/dL — ABNORMAL LOW (ref 12.0–15.0)

## 2018-11-09 LAB — POC OCCULT BLOOD, ED: Fecal Occult Bld: POSITIVE — AB

## 2018-11-09 LAB — BRAIN NATRIURETIC PEPTIDE
B Natriuretic Peptide: 588.1 pg/mL — ABNORMAL HIGH (ref 0.0–100.0)
B Natriuretic Peptide: 458.9 pg/mL — ABNORMAL HIGH (ref 0.0–100.0)

## 2018-11-09 IMAGING — CR CHEST - 2 VIEW
2 series · 2 of 2 positions shown · non-contrast
Comparison: [DATE]

CLINICAL DATA: Pt stated she didn't know why she was here and has
no current or previous chest complaints, HTN, diabetic, nonsmoker

EXAM:
CHEST - 2 VIEW

[chest pa]
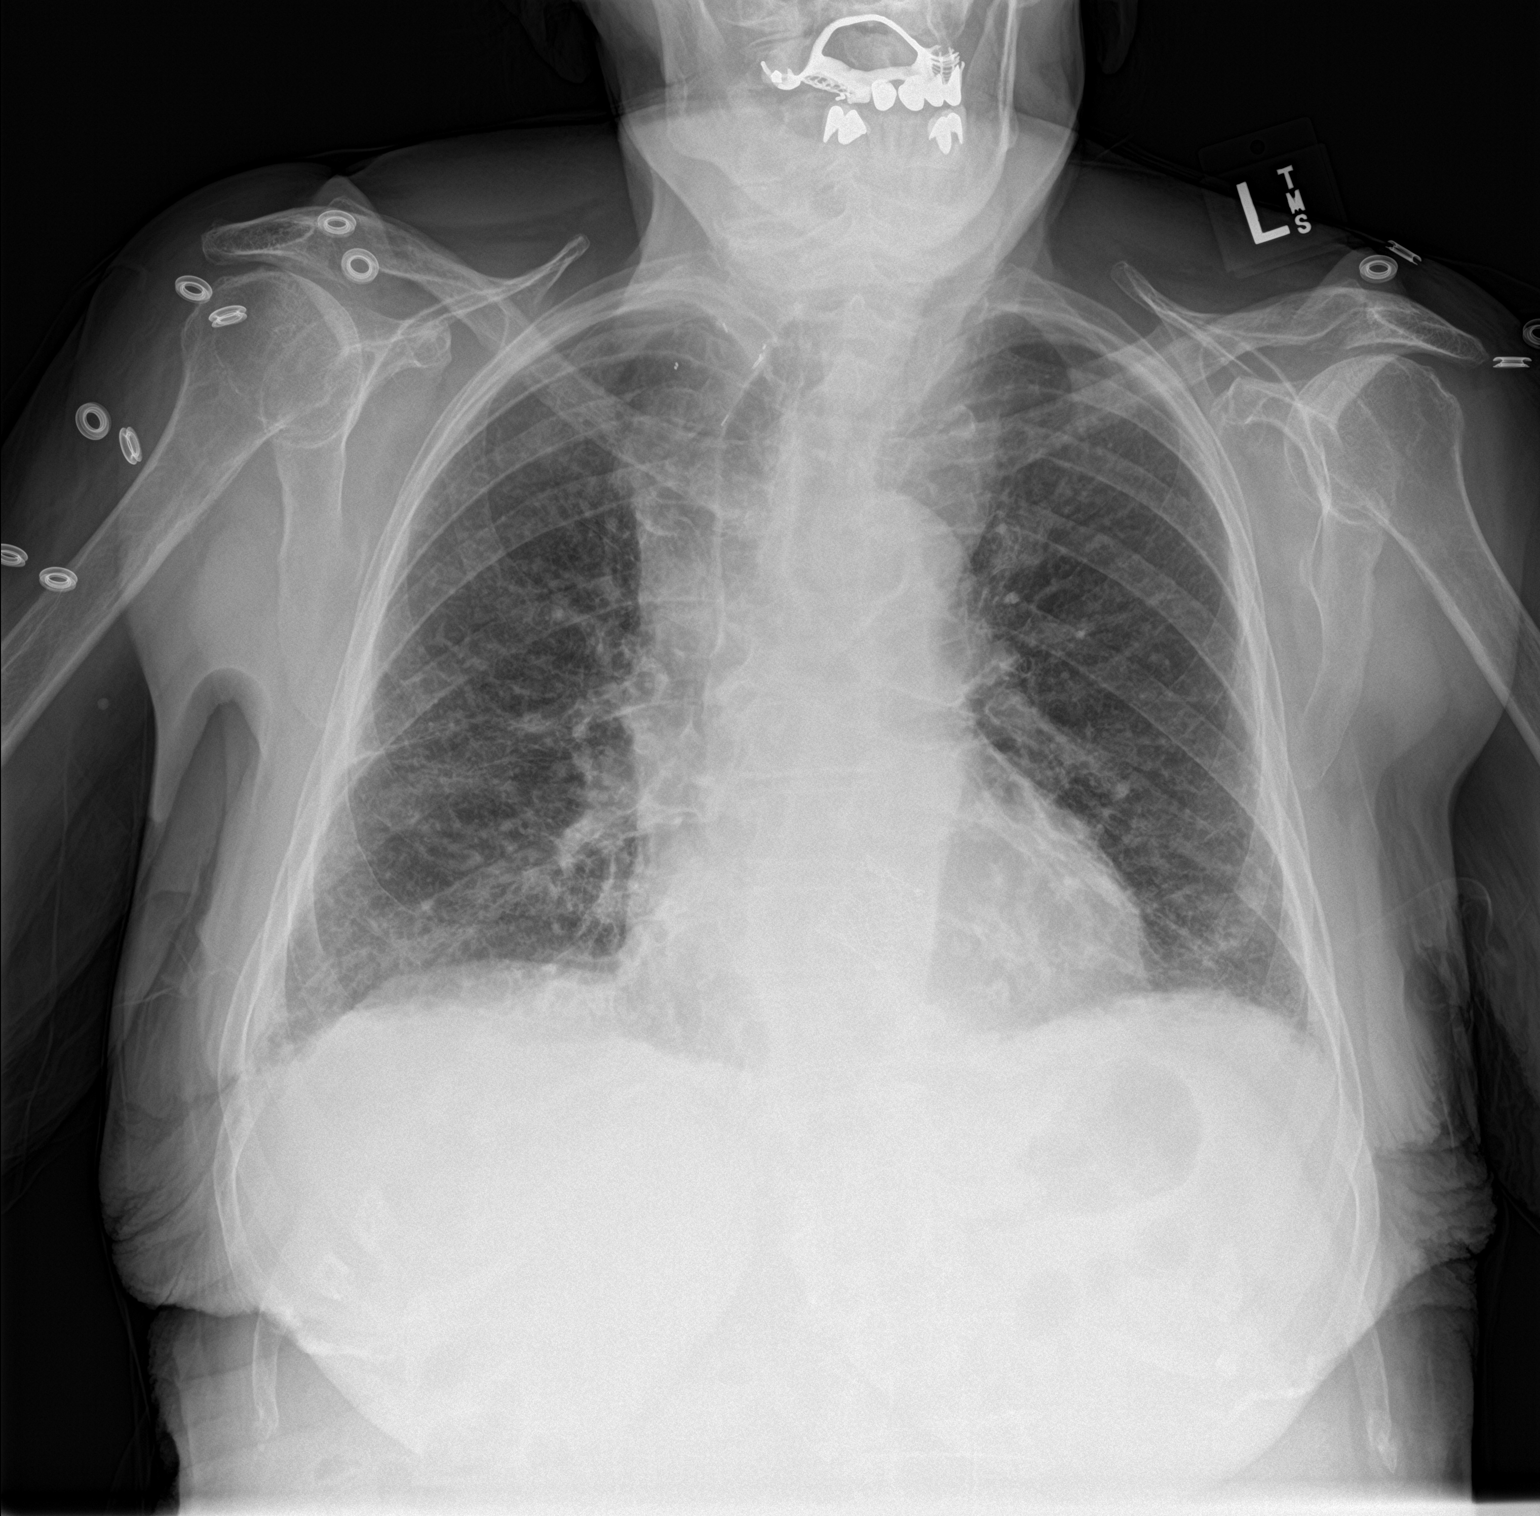

[chest lat]
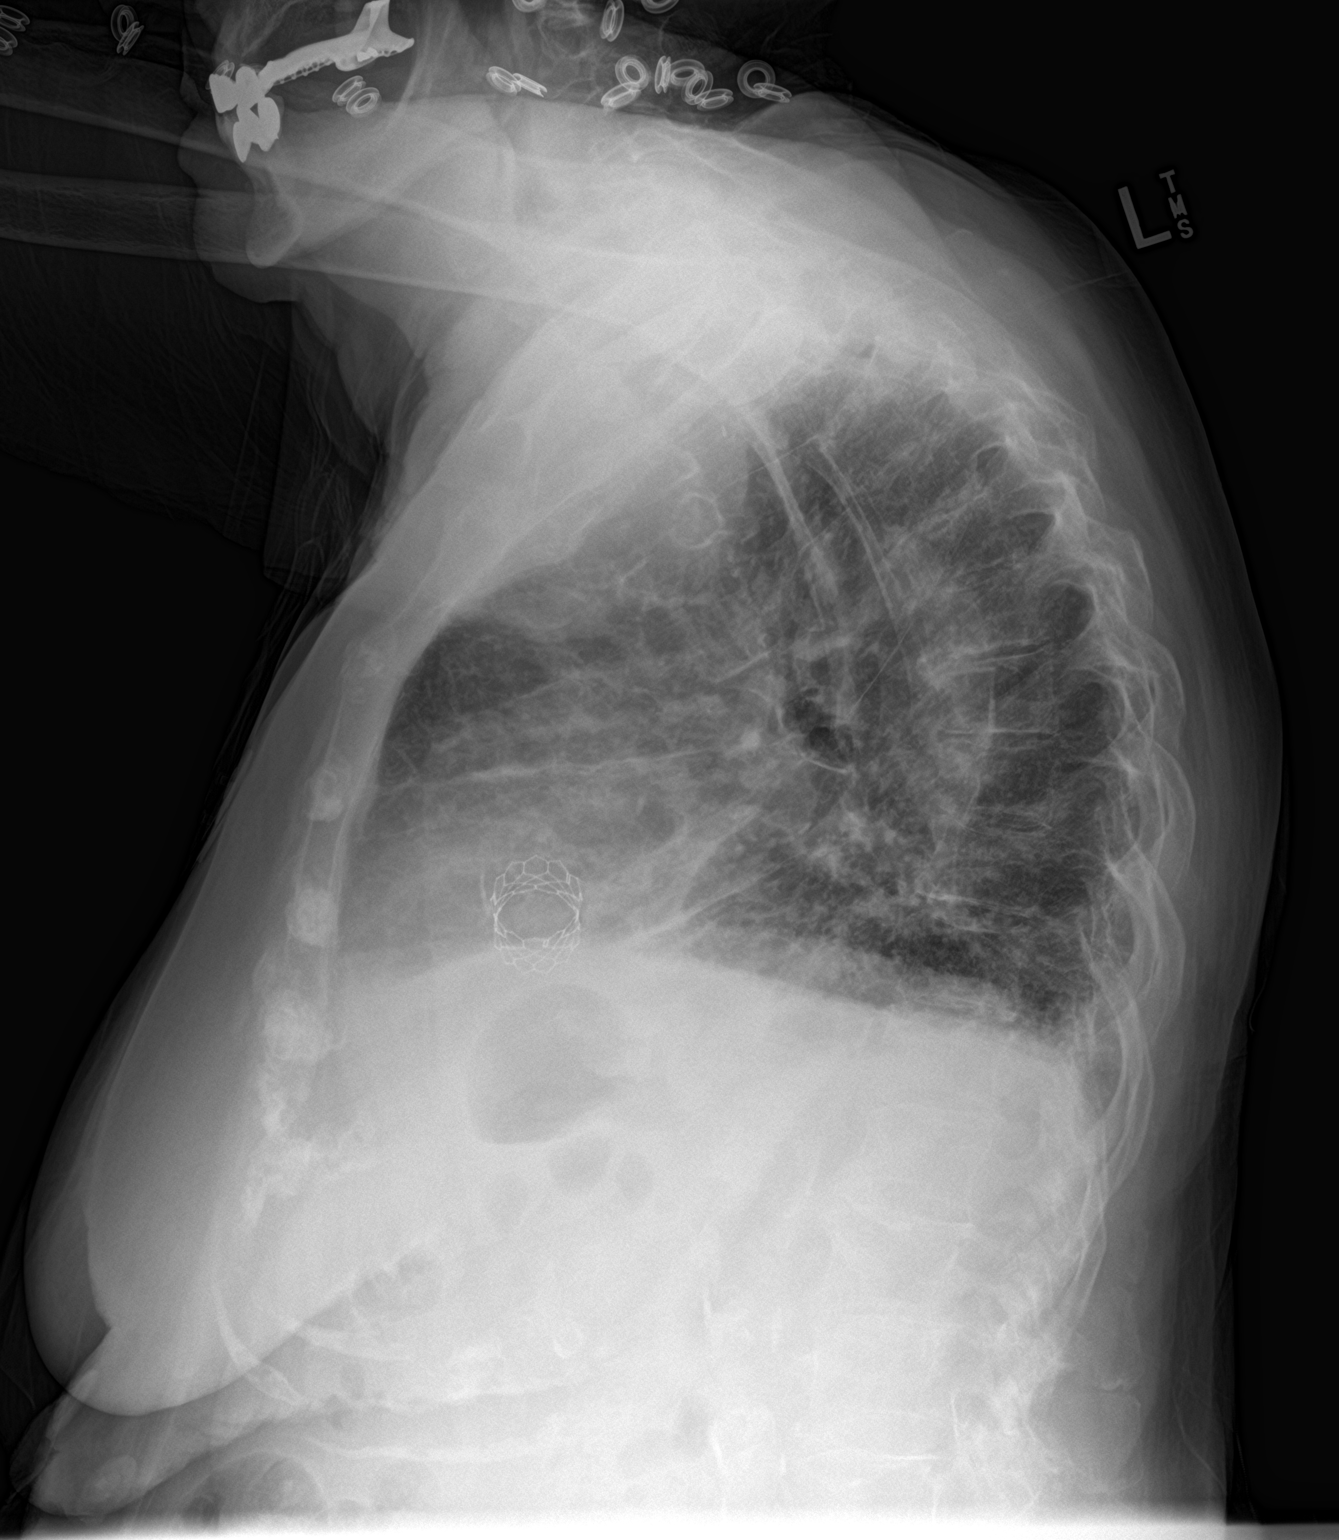

[2 of 2 positions shown; findings below may reference images not displayed]

FINDINGS: Cardiac silhouette is normal in size. There are stable changes from
endovascular aortic valve replacement. No mediastinal or hilar
masses. There is no evidence of adenopathy.

There are prominent bronchovascular and interstitial lung markings
similar to prior exams. No lung consolidation. No pleural effusion
or pneumothorax.

Skeletal structures are demineralized but grossly intact.
IMPRESSION: No acute cardiopulmonary disease.

## 2018-11-09 MED ORDER — FUROSEMIDE 10 MG/ML IJ SOLN
40.0000 mg | Freq: Once | INTRAMUSCULAR | Status: AC
  Administered 2018-11-09: 16:00:00 40 mg via INTRAVENOUS
  Filled 2018-11-09: qty 4

## 2018-11-09 MED ORDER — SODIUM CHLORIDE 0.9% FLUSH
3.0000 mL | Freq: Two times a day (BID) | INTRAVENOUS | Status: DC
  Administered 2018-11-10 – 2018-11-12 (×3): 3 mL via INTRAVENOUS

## 2018-11-09 MED ORDER — POLYETHYLENE GLYCOL 3350 17 G PO PACK: 17.0000 g | PACK | Freq: Every day | ORAL | Status: DC | PRN

## 2018-11-09 MED ORDER — FUROSEMIDE 10 MG/ML IJ SOLN
40.0000 mg | Freq: Two times a day (BID) | INTRAMUSCULAR | Status: DC
  Administered 2018-11-10 – 2018-11-11 (×4): 40 mg via INTRAVENOUS
  Filled 2018-11-09 (×4): qty 4

## 2018-11-09 MED ORDER — TRAZODONE HCL 50 MG PO TABS
25.0000 mg | ORAL_TABLET | Freq: Every evening | ORAL | Status: DC | PRN
  Administered 2018-11-10 – 2018-11-12 (×2): 25 mg via ORAL
  Filled 2018-11-09 (×2): qty 1

## 2018-11-09 MED ORDER — SODIUM CHLORIDE 0.9 % IV SOLN
10.0000 mL/h | Freq: Once | INTRAVENOUS | Status: AC
  Administered 2018-11-09: 10 mL/h via INTRAVENOUS

## 2018-11-09 MED ORDER — SODIUM CHLORIDE 0.9% FLUSH: 3.0000 mL | INTRAVENOUS | Status: DC | PRN

## 2018-11-09 MED ORDER — ACETAMINOPHEN 650 MG RE SUPP: 650.0000 mg | Freq: Four times a day (QID) | RECTAL | Status: DC | PRN

## 2018-11-09 MED ORDER — TRAMADOL HCL 50 MG PO TABS: 50.0000 mg | ORAL_TABLET | Freq: Four times a day (QID) | ORAL | Status: DC | PRN

## 2018-11-09 MED ORDER — SODIUM CHLORIDE 0.9 % IV SOLN: 250.0000 mL | INTRAVENOUS | Status: DC | PRN

## 2018-11-09 MED ORDER — ACETAMINOPHEN 325 MG PO TABS: 650.0000 mg | ORAL_TABLET | Freq: Four times a day (QID) | ORAL | Status: DC | PRN

## 2018-11-09 MED ORDER — SODIUM CHLORIDE 0.9% FLUSH
3.0000 mL | Freq: Two times a day (BID) | INTRAVENOUS | Status: DC
  Administered 2018-11-09 – 2018-11-13 (×7): 3 mL via INTRAVENOUS

## 2018-11-09 NOTE — H&P (View-Only) (Signed)
[facility] Consult: 3:44 PM 11/09/2018  LOS: 0 days    Referring Provider: Dr. Ashok Cordia Primary Care Physician:  Jonathon Jordan, MD [facility] primary care. Primary Gastroenterologist:  Dr. Lyndel Safe new to him as of 10/2018.    Reason for Consultation: Anemia.  FOBT positive.   HPI: ARAH ARO is a 85 y.o. female.  History A. fib.  S/P TAVR.  On chronic Edoxaban (NOAC).   CKD, stage III.   No information regarding previous EGD or colonoscopy found in epic.   Admitted 3/20 -09/27/2018 with choledocholithiasis. 10/24/18 ERCP with sphincterotomy and stone removal. 10/25/18 Laparoscopic cholecystectomy Discharged on 81 ASA , Keflex and metronidazole x4 days resumed anticoagulation 10/28/2018.  Seen at surgery office today for routine wound check.  The wound looked good, she passed with flying colors.  However surgery was concerned by her pulse of 172, blood pressure 134/96.  Surgeon advised her to call her PMD.  When she called Dr. Cheron Schaumann office she spoke with the nurse.  She mentioned 3 days of weeping fluid out of her swollen legs and some skin breakdown on the back of her legs.  She was not having any dizziness, fatigue, sense of tachycardia, shortness of breath.  I spoke with her son.  There is been no notice of dark or tarry stools though the patient does go to the bathroom by herself.  Since discharge she has had a nursing assistant with her from 9-5 daily and family member from [place] until 9 in the morning.  The son mentions that the mom seems to have had some cognitive decline since her surgery, she is obsessing that she cannot hear with her hearing aids that the batteries are poor.  She changes the batteries every 20 minutes but still says she cannot hear well.  Is been eating well.  Not complaining of anything in  particular in terms of GI issues.  In the ED Hgb is 7.6, compares to 11.8 on 10/26/2018.  No elevation of her white count. Platelets 568.  BUN not elevated. Stool is FOBT positive at the lab. Heart rate initially 118 but now in the 50s.  Blood pressure in the 150s to 160s/50s to 70s.  Room air saturation at 100%. Telemetry monitor shows atrial fibrillation.   Past Medical History:  Diagnosis Date   1st degree AV block    Bradycardia    Chronic diastolic heart failure ([place]) 02/07/2016   CKD (chronic kidney disease), stage III (HCC)    Colon, diverticulosis    Diabetes mellitus    diet controlled   Esophageal reflux    Essential hypertension    GERD (gastroesophageal reflux disease)    Hematochezia    HOH (hard of hearing)    wears bilateral hearing aids   Hx of orthostatic hypotension    Interstitial lung disease (HCC)    LBBB (left bundle branch block)    Long term current use of anticoagulant therapy    Mediastinal goiter     recurrent s/p resection 1988   Mild CAD    Mitral regurgitation  Osteopenia    Osteoporosis    Paroxysmal atrial fibrillation (HCC)    a. CHA2DS2VASc = 5-->coumadin;  b. previously on amio->d/c'd 11/2014 secondary to concern for possible amio lung.   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   23 mm Edwards Sapien 3 transcatheter heart valve placed via percutaneous right transfemoral approach   Severe aortic stenosis    Stroke ([place])    Tachycardia-bradycardia ([place])    Vitamin D deficiency     Past Surgical History:  Procedure Laterality Date   BILIARY DILATION  10/24/2018   Procedure: BILIARY DILATION;  Surgeon: Jackquline Denmark, MD;  Location: [facility];  Service: Endoscopy;;   CARDIAC CATHETERIZATION N/A 10/31/2015   Procedure: Right/Left Heart Cath and Coronary Angiography;  Surgeon: [name], MD;  Location: [facility];  Service: Cardiovascular;  Laterality: N/A;   CARPAL  TUNNEL RELEASE Bilateral    CATARACT EXTRACTION Bilateral    CESAREAN SECTION     x3   CHOLECYSTECTOMY N/A 10/25/2018   Procedure: LAPAROSCOPIC CHOLECYSTECTOMY WITH INTRAOPERATIVE CHOLANGIOGRAM;  Surgeon: Kieth Brightly Arta Bruce, MD;  Location: [facility];  Service: General;  Laterality: N/A;   ERCP N/A 10/24/2018   Procedure: ENDOSCOPIC RETROGRADE CHOLANGIOPANCREATOGRAPHY (ERCP);  Surgeon: Jackquline Denmark, MD;  Location: [facility];  Service: Endoscopy;  Laterality: N/A;   mediastinal removal of a goiter     MRI  08/18/07   head (di[facility] heart study [facility])   [place]  10/24/2018   Procedure: REMOVAL OF STONES;  Surgeon: Jackquline Denmark, MD;  Location: [facility];  Service: Endoscopy;;   SPHINCTEROTOMY  10/24/2018   Procedure: Joan Mayans;  Surgeon: Jackquline Denmark, MD;  Location: [facility];  Service: Endoscopy;;   TEE WITHOUT CARDIOVERSION N/A 11/27/2015   Procedure: TRANSESOPHAGEAL ECHOCARDIOGRAM (TEE);  Surgeon: [name], MD;  Location: [facility];  Service: Open Heart Surgery;  Laterality: N/A;   TRANSCATHETER AORTIC VALVE REPLACEMENT, TRANSFEMORAL N/A 11/27/2015   Procedure: TRANSCATHETER AORTIC VALVE REPLACEMENT, TRANSFEMORAL;  Surgeon: [name], MD;  Location: [facility];  Service: Open Heart Surgery;  Laterality: N/A;    Prior to Admission medications   Medication Sig Start Date End Date Taking? Authorizing Provider  acetaminophen (TYLENOL) 325 MG tablet Take 2 tablets (650 mg total) by mouth every 6 (six) hours as needed for mild pain. 10/26/18  Yes Swayze, Ava, DO  edoxaban (SAVAYSA) 30 MG TABS tablet Take 1 tablet (30 mg total) by mouth daily. 11/02/18  Yes [name], MD  aspirin EC 81 MG tablet Take 81 mg by mouth daily.    [provider]  atorvastatin (LIPITOR) 10 MG tablet Take 10 mg daily by mouth.    [provider]  lisinopril (PRINIVIL,ZESTRIL) 10 MG tablet Take 1 tablet (10 mg total) by mouth daily. Please call our office to schedule  an appt for future refills (2nd attempt) 08/02/18   Dunn, Nedra Hai, PA-C  Multiple Vitamins-Minerals (OCUVITE PRESERVISION PO) Take 1 capsule by mouth daily.     [provider]  traMADol (ULTRAM) 50 MG tablet Take 1 tablet (50 mg total) by mouth every 6 (six) hours as needed for moderate pain. 10/26/18   Swayze, Ava, DO  VITAMIN D PO Take 1 tablet by mouth daily.    [provider]    Scheduled Meds:  furosemide  40 mg Intravenous Once   Infusions:  sodium chloride     PRN Meds:    Allergies as of 11/09/2018 - Review Complete 11/09/2018  Allergen Reaction Noted   Amoxicillin Other (See Comments) 11/25/2011   Celebrex [celecoxib] Other (See Comments) 11/25/2011    Family History  Problem Relation Age of Onset   Diabetes Mother    Cancer - Prostate Father    Pulmonary embolism Father    Hypertension Sister    Diabetes Sister    Hypertension Brother    Arrhythmia Brother        afib   CAD Brother    Pulmonary fibrosis Brother     Social History   Socioeconomic History   Marital status: Widowed    Spouse name: Not on file   Number of children: 3   Years of education: Not on file   Highest education level: Not on file  Occupational History   Occupation: retire  Social Designer, fashion/clothing strain: Not on file   Food insecurity:    Worry: Not on file    Inability: Not on file   Transportation needs:    Medical: Not on file    Non-medical: Not on file  Tobacco Use   Smoking status: Never Smoker   Smokeless tobacco: Never Used  Substance and Sexual Activity   Alcohol use: No    Alcohol/week: 0.0 standard drinks   Drug use: No   Sexual activity: Not on file  Lifestyle   Physical activity:    Days per week: Not on file    Minutes per session: Not on file   Stress: Not on file  Relationships   Social connections:    Talks on phone: Not on file    Gets together: Not on file    Attends religious service: Not  on file    Active member of club or organization: Not on file    Attends meetings of clubs or organizations: Not on file    Relationship status: Not on file   Intimate partner violence:    Fear of current or ex partner: Not on file    Emotionally abused: Not on file    Physically abused: Not on file    Forced sexual activity: Not on file  Other Topics Concern   Not on file  Social History Narrative   Lives in [place].  Widowed.    REVIEW OF SYSTEMS: Constitutional: Per HPI. ENT:  No nose bleeds Pulm: Per HPI. CV:  No palpitations, + LE edema with weeping laterally..  GU:  No hematuria, no frequency GI: Per HPI Heme: Denies unusual bleeding or bruising. Transfusions: None the past.  He is about to receive 1 unit of PRBCs. Neuro:  No headaches, no peripheral tingling or numbness Derm:  No itching, no rash.  .  Endocrine:  No sweats or chills.  No polyuria or dysuria Immunization: Reviewed. Travel:  None beyond local counties in last few months.    PHYSICAL EXAM: Vital signs in last 24 hours: Vitals:   11/09/18 1500 11/09/18 1530  BP: (!) 166/57 (!) 143/75  Pulse: (!) 53 (!) 56  Resp: 18 (!) 23  Temp:    SpO2: 99% 100%   Wt Readings from Last 3 Encounters:  10/26/18 65.2 kg  06/11/18 60.1 kg  11/20/17 67.1 kg    General: Aged patient but does not look her age.  Somewhat pale.  Alert.  A bit fretful. Head: No facial asymmetry or swelling.  No signs of head trauma. Eyes: Scleral icterus.  No conjunctival pallor. Ears: Hard of hearing Nose: No discharge Mouth: Moist, pink, clear oropharynx.  Tongue midline.  Good dentition. Neck: No JVD, no masses, no thyromegaly. Lungs: No labored breathing or cough.  Lungs clear bilaterally. Heart: Irregularly irregular.  Rate not accelerated. Abdomen: Soft.  Not tender or distended.  Well-healed laparoscopy scars.  Active bowel sounds.  No distention..   Rectal: Small amounts of hard, difficult to sample stool which is dark  brown, FOBT positive. Musc/Skeltl: No joint redness, swelling, gross deformity. Extremities: Lower extremity edema with weeping. Neurologic: Oriented to hospital, self.  Knew it was 2000 and something but told me she looked it up last week but could not remember exactly what year.  Follows all commands.  No tremor.  No gross weakness. Skin: Lower extremity weeping. Tattoos: None Nodes: No cervical adenopathy Psych: Cooperative, pleasant.  Intake/Output from previous day: No intake/output data recorded. Intake/Output this shift: No intake/output data recorded.  LAB RESULTS: Recent Labs    11/09/18 1315  WBC 9.4  HGB 7.6*  HCT 24.8*  PLT 568*   BMET Lab Results  Component Value Date   NA 136 11/09/2018   NA 139 10/26/2018   NA 138 10/25/2018   K 4.2 11/09/2018   K 3.6 10/26/2018   K 3.7 10/25/2018   CL 103 11/09/2018   CL 112 (H) 10/26/2018   CL 111 10/25/2018   CO2 24 11/09/2018   CO2 17 (L) 10/26/2018   CO2 17 (L) 10/25/2018   GLUCOSE 147 (H) 11/09/2018   GLUCOSE 125 (H) 10/26/2018   GLUCOSE 181 (H) 10/25/2018   BUN 18 11/09/2018   BUN 32 (H) 10/26/2018   BUN 35 (H) 10/25/2018   CREATININE 1.28 (H) 11/09/2018   CREATININE 1.29 (H) 10/26/2018   CREATININE 1.52 (H) 10/25/2018   CALCIUM 8.3 (L) 11/09/2018   CALCIUM 7.7 (L) 10/26/2018   CALCIUM 7.6 (L) 10/25/2018   LFT Recent Labs    11/09/18 1315  PROT 6.2*  ALBUMIN 3.2*  AST 17  ALT 19  ALKPHOS 84  BILITOT 1.1   PT/INR Lab Results  Component Value Date   INR 1.3 12/24/2015   INR 1.44 11/29/2015   INR 1.65 (H) 11/27/2015   Hepatitis Panel No results for input(s): HEPBSAG, HCVAB, HEPAIGM, HEPBIGM in the last 72 hours. C-Diff No components found for: CDIFF Lipase     Component Value Date/Time   LIPASE 30 10/22/2018 2036    Drugs of Abuse  No results found for: LABOPIA, COCAINSCRNUR, LABBENZ, AMPHETMU, THCU, LABBARB   RADIOLOGY STUDIES: Dg Chest 2 View  Result Date: 11/09/2018 CLINICAL  DATA:  Pt stated she didn't know why she was here and has no current or previous chest complaints, HTN, diabetic, nonsmoker EXAM: CHEST - 2 VIEW COMPARISON:  11/28/2015 FINDINGS: Cardiac silhouette is normal in size. There are stable changes from endovascular aortic valve replacement. No mediastinal or hilar masses. There is no evidence of adenopathy. There are prominent bronchovascular and interstitial lung markings similar to prior exams. No lung consolidation. No pleural effusion or pneumothorax. Skeletal structures are demineralized but grossly intact. IMPRESSION: No acute cardiopulmonary disease. Electronically Signed   By: Lajean Manes M.D.   On: 11/09/2018 13:59     IMPRESSION:   *    Acute anemia.  Surprisingly not symptomatic other than the pulse recorded in the 170s at her doctor's office today though the accompanying blood pressure was actually hypertensive, not hypotensive. Stool FOBT positive but not having melena. No abdominal pain to suggest a retroperitoneal or intra-abdominal bleed. ? post sphincterotomy bleeding?  *   10/24/2018 ERCP with  sphincterotomy and stone removal.  *     10/25/18 laparoscopic cholecystectomy.  Everything looked good at postsurgical office visit today  *    Chronic NOAC for A fib.    *   Stage III chronic kidney disease.  BUN/creatinine today improved compared with recent hospitalization.   PLAN:     *   Will probably need EGD for investigation of GI bleeding.   *   Given the significant decline in her Hgb, ?  Should we image the abdomen to assess for retroperitoneal or other intra-abdominal bleed.   Azucena Freed  11/09/2018, 3:44 PM Phone [phone]

## 2018-11-09 NOTE — ED Notes (Signed)
Pt's bilateral lower legs weeping clear fluid.

## 2018-11-09 NOTE — Telephone Encounter (Signed)
New Message   Alexa Mcdonald is calling from [employer], she says the case is time sensitive. It is about a medication appeal.  Medication is Savaysa  Please call back

## 2018-11-09 NOTE — ED Provider Notes (Signed)
[facility] EMERGENCY DEPARTMENT Provider Note   CSN: [id number] Arrival date & time: 11/09/18  1246    History   Chief Complaint Chief Complaint  Patient presents with   Leg Swelling    HPI Alexa Mcdonald is a 85 y.o. female.     85yo female with history of CKD, CHF, afib on Edoxaban (held during admission and restarted 3/26) sent by provider at post op visit today for swelling of the lower extremities and and elevated heart rate today. Patient was admitted to the hospital 3/20, had ERCP with stone removal followed by laparoscopic cholecystectomy and was dc home on 10/26/2018. Patient denies any complaints today and does not know why she is in the ER. Patient denies feeling SHOB, weak/dizzy/lightheaded. Patient is aware of the swelling in her legs but unsure how long her feet have been swollen. Patient reports last bowel movement was today, denies bloody or black stools. Denies fevers, chills, abdominal pain, vomiting.      Past Medical History:  Diagnosis Date   1st degree AV block    Bradycardia    Chronic diastolic heart failure ([place]) 02/07/2016   CKD (chronic kidney disease), stage III (HCC)    Colon, diverticulosis    Diabetes mellitus    diet controlled   Esophageal reflux    Essential hypertension    GERD (gastroesophageal reflux disease)    Hematochezia    HOH (hard of hearing)    wears bilateral hearing aids   Hx of orthostatic hypotension    Interstitial lung disease (HCC)    LBBB (left bundle branch block)    Long term current use of anticoagulant therapy    Mediastinal goiter     recurrent s/p resection 1988   Mild CAD    Mitral regurgitation    Osteopenia    Osteoporosis    Paroxysmal atrial fibrillation ([place])    a. CHA2DS2VASc = 5-->coumadin;  b. previously on amio->d/c'd 11/2014 secondary to concern for possible amio lung.   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   23 mm  Edwards Sapien 3 transcatheter heart valve placed via percutaneous right transfemoral approach   Severe aortic stenosis    Stroke ([place])    Tachycardia-bradycardia ([place])    Vitamin D deficiency     Patient Active Problem List   Diagnosis Date Noted   Choledocholithiasis with obstruction 10/23/2018   Occipital stroke ([place]) 06/11/2016   Paroxysmal atrial fibrillation ([place]) 06/11/2016   Homonymous hemianopsia due to old embolic stroke 41/32/4401   Loss of peripheral visual field 03/12/2016   Ataxia 03/12/2016   Dizziness and giddiness 03/12/2016   Cerebrovascular accident (CVA) due to embolism of posterior cerebral artery with infarctions of both occipital lobes ([place]) 03/12/2016   Chronic diastolic heart failure ([place]) 02/07/2016   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   Severe aortic stenosis    Atrial fibrillation, persistent    Mitral regurgitation    Interstitial lung disease ([place]) 10/30/2014   Hematochezia 12/13/2013   CKD (chronic kidney disease), stage III ([place]) 12/13/2013   Hyperthyroidism 12/13/2013   GERD (gastroesophageal reflux disease) 12/13/2013   Long term current use of anticoagulant therapy 12/13/2013   Elevated brain natriuretic peptide (BNP) level 12/13/2013   Sinus bradycardia 12/13/2013   Pneumonia 12/13/2013   Encounter for therapeutic drug monitoring 08/29/2013   Essential hypertension 08/05/2013   Edema of both ankles 08/05/2013   Bradycardia 11/27/2011    Past Surgical History:  Procedure  Laterality Date   BILIARY DILATION  10/24/2018   Procedure: BILIARY DILATION;  Surgeon: Jackquline Denmark, MD;  Location: [facility];  Service: Endoscopy;;   CARDIAC CATHETERIZATION N/A 10/31/2015   Procedure: Right/Left Heart Cath and Coronary Angiography;  Surgeon: [name], MD;  Location: [facility];  Service: Cardiovascular;  Laterality: N/A;   CARPAL TUNNEL RELEASE Bilateral    CATARACT EXTRACTION  Bilateral    CESAREAN SECTION     x3   CHOLECYSTECTOMY N/A 10/25/2018   Procedure: LAPAROSCOPIC CHOLECYSTECTOMY WITH INTRAOPERATIVE CHOLANGIOGRAM;  Surgeon: Kieth Brightly Arta Bruce, MD;  Location: [facility];  Service: General;  Laterality: N/A;   ERCP N/A 10/24/2018   Procedure: ENDOSCOPIC RETROGRADE CHOLANGIOPANCREATOGRAPHY (ERCP);  Surgeon: Jackquline Denmark, MD;  Location: [facility];  Service: Endoscopy;  Laterality: N/A;   mediastinal removal of a goiter     MRI  08/18/07   head (diabetes heart study at [facility])   [place]  10/24/2018   Procedure: REMOVAL OF STONES;  Surgeon: Jackquline Denmark, MD;  Location: [facility];  Service: Endoscopy;;   SPHINCTEROTOMY  10/24/2018   Procedure: Joan Mayans;  Surgeon: Jackquline Denmark, MD;  Location: [facility];  Service: Endoscopy;;   TEE WITHOUT CARDIOVERSION N/A 11/27/2015   Procedure: TRANSESOPHAGEAL ECHOCARDIOGRAM (TEE);  Surgeon: [name], MD;  Location: [facility];  Service: Open Heart Surgery;  Laterality: N/A;   TRANSCATHETER AORTIC VALVE REPLACEMENT, TRANSFEMORAL N/A 11/27/2015   Procedure: TRANSCATHETER AORTIC VALVE REPLACEMENT, TRANSFEMORAL;  Surgeon: [name], MD;  Location: [facility];  Service: Open Heart Surgery;  Laterality: N/A;     OB History   No obstetric history on file.      Home Medications    Prior to Admission medications   Medication Sig Start Date End Date Taking? Authorizing Provider  acetaminophen (TYLENOL) 325 MG tablet Take 2 tablets (650 mg total) by mouth every 6 (six) hours as needed for mild pain. 10/26/18   Swayze, Ava, DO  aspirin EC 81 MG tablet Take 81 mg by mouth daily.    [provider]  atorvastatin (LIPITOR) 10 MG tablet Take 10 mg daily by mouth.    [provider]  b complex vitamins tablet Take 1 tablet by mouth daily.    [provider]  CALCIUM PO Take 1 tablet by mouth daily.    [provider]  edoxaban (SAVAYSA) 30 MG TABS tablet Take 1 tablet (30 mg  total) by mouth daily. 11/02/18   [name], MD  lisinopril (PRINIVIL,ZESTRIL) 10 MG tablet Take 1 tablet (10 mg total) by mouth daily. Please call our office to schedule an appt for future refills (2nd attempt) 08/02/18   Dunn, Nedra Hai, PA-C  methimazole (TAPAZOLE) 10 MG tablet Take 5 mg by mouth daily. 09/27/18   [provider]  Multiple Vitamins-Minerals (OCUVITE PRESERVISION PO) Take 1 capsule by mouth daily.     [provider]  RESTASIS MULTIDOSE 0.05 % ophthalmic emulsion Apply 1 drop to eye daily. 08/13/17   [provider]  traMADol (ULTRAM) 50 MG tablet Take 1 tablet (50 mg total) by mouth every 6 (six) hours as needed for moderate pain. 10/26/18   Swayze, Ava, DO  VITAMIN D PO Take 1 tablet by mouth daily.    [provider]    Family History Family History  Problem Relation Age of Onset   Diabetes Mother    Cancer - Prostate Father    Pulmonary embolism Father    Hypertension Sister  Diabetes Sister    Hypertension Brother    Arrhythmia Brother        afib   CAD Brother    Pulmonary fibrosis Brother     Social History Social History   Tobacco Use   Smoking status: Never Smoker   Smokeless tobacco: Never Used  Substance Use Topics   Alcohol use: No    Alcohol/week: 0.0 standard drinks   Drug use: No     Allergies   Amoxicillin and Celebrex [celecoxib]   Review of Systems Review of Systems   Physical Exam Updated Vital Signs BP (!) 143/75    Pulse (!) 56    Temp 98.3 F (36.8 C) (Oral)    Resp (!) 23    SpO2 100%   Physical Exam Vitals signs and nursing note reviewed. Exam conducted with a chaperone present.  Constitutional:      General: She is not in acute distress.    Appearance: She is well-developed. She is not diaphoretic.  HENT:     Head: Normocephalic and atraumatic.  Eyes:     Comments: Conjunctiva pale  Cardiovascular:     Rate and Rhythm: Normal rate. Rhythm irregular.     Pulses:  Normal pulses.     Heart sounds: Normal heart sounds. No murmur.  Pulmonary:     Effort: Tachypnea present.     Breath sounds: Normal breath sounds. No wheezing, rhonchi or rales.  Abdominal:     Tenderness: There is no abdominal tenderness.  Genitourinary:    Rectum: Guaiac result positive.     Comments: Scant amount of firm dark stool in rectum  Musculoskeletal:     Right lower leg: Edema present.     Left lower leg: Edema present.     Comments: Pitting edema to lower extremities, very slight pitting to abdomen  Skin:    General: Skin is warm and dry.     Coloration: Skin is pale.     Findings: Bruising present. No erythema.     Comments: Minor bruising to bilateral lower extremities   Neurological:     Mental Status: She is alert and oriented to person, place, and time.  Psychiatric:        Behavior: Behavior normal.      ED Treatments / Results  Labs (all labs ordered are listed, but only abnormal results are displayed) Labs Reviewed  CBC WITH DIFFERENTIAL/PLATELET - Abnormal; Notable for the following components:      Result Value   RBC 2.61 (*)    Hemoglobin 7.6 (*)    HCT 24.8 (*)    RDW 17.0 (*)    Platelets 568 (*)    Neutro Abs 8.0 (*)    Lymphs Abs 0.6 (*)    Abs Immature Granulocytes 0.08 (*)    All other components within normal limits  COMPREHENSIVE METABOLIC PANEL - Abnormal; Notable for the following components:   Glucose, Bld 147 (*)    Creatinine, Ser 1.28 (*)    Calcium 8.3 (*)    Total Protein 6.2 (*)    Albumin 3.2 (*)    GFR calc non Af Amer 36 (*)    GFR calc Af Amer 41 (*)    All other components within normal limits  BRAIN NATRIURETIC PEPTIDE - Abnormal; Notable for the following components:   B Natriuretic Peptide 588.1 (*)    All other components within normal limits  POC OCCULT BLOOD, ED - Abnormal; Notable for the following components:   Fecal Occult  Bld POSITIVE (*)    All other components within normal limits  SAMPLE TO BLOOD  BANK  PREPARE RBC (CROSSMATCH)  TYPE AND SCREEN    EKG EKG Interpretation  Date/Time:  Tuesday November 09 2018 12:04:34 EDT Ventricular Rate:  70 PR Interval:    QRS Duration: 144 QT Interval:  480 QTC Calculation: 518 R Axis:   -24 Text Interpretation:  Atrial fibrillation Left bundle branch block No significant change since last tracing Confirmed by Lajean Saver [phone]) on 11/09/2018 3:02:45 PM   Radiology Dg Chest 2 View  Result Date: 11/09/2018 CLINICAL DATA:  Pt stated she didn't know why she was here and has no current or previous chest complaints, HTN, diabetic, nonsmoker EXAM: CHEST - 2 VIEW COMPARISON:  11/28/2015 FINDINGS: Cardiac silhouette is normal in size. There are stable changes from endovascular aortic valve replacement. No mediastinal or hilar masses. There is no evidence of adenopathy. There are prominent bronchovascular and interstitial lung markings similar to prior exams. No lung consolidation. No pleural effusion or pneumothorax. Skeletal structures are demineralized but grossly intact. IMPRESSION: No acute cardiopulmonary disease. Electronically Signed   By: Lajean Manes M.D.   On: 11/09/2018 13:59    Procedures .Critical Care Performed by: Tacy Learn, PA-C Authorized by: Tacy Learn, PA-C   Critical care provider statement:    Critical care time (minutes):  45   Critical care was necessary to treat or prevent imminent or life-threatening deterioration of the following conditions:  Circulatory failure   Critical care was time spent personally by me on the following activities:  Discussions with consultants, evaluation of patient's response to treatment, examination of patient, ordering and performing treatments and interventions, ordering and review of laboratory studies, ordering and review of radiographic studies, pulse oximetry, re-evaluation of patient's condition, obtaining history from patient or surrogate and review of old charts   (including  critical care time)  Medications Ordered in ED Medications  0.9 %  sodium chloride infusion (has no administration in time range)     Initial Impression / Assessment and Plan / ED Course  I have reviewed the triage vital signs and the nursing notes.  Pertinent labs & imaging results that were available during my care of the patient were reviewed by me and considered in my medical decision making (see chart for details).  Clinical Course as of Nov 08 1532  Tue Nov 09, 2018  1510 85yo female sent to the ER at post op lap chole visit today for tachycardia and swelling of her legs. Patient denies any complaints, is on blood thinner for persistent afib. On exam, mildly tachypneic, pale, pitting edema noted bilateral lower extremities, lung sounds clear, heart rate is regular.  On rectal exam patient has scant amount of firm stool, dark in nature, Hemoccult positive.  Review of lab work shows CBC with hemoglobin of 7.6, upon mission to the hospital on March 20 patient's hemoglobin was noted to be 14, discharged home at 11.8.  CMP relatively unchanged compared to discharge labs.  Chest x-ray does not show cardiomegaly or signs of CHF.  As discussed with Dr. Ashok Cordia, ER attending who has seen the patient, consult to hospital for admission, transfuse, 1 unit PRBCs ordered.    [LM]  1526 Case discussed with [facility] Dr. Evangeline Gula, accepts consult for admission, requests consult with GI.    [LM]  1532 Case discussed with  GI, requests patient remain NPO, will plan to see today, if unable to see today will  see tomorrow.    [LM]    Clinical Course User Index [LM] Tacy Learn, PA-C      Final Clinical Impressions(s) / ED Diagnoses   Final diagnoses:  Anemia, unspecified type  Bilateral lower extremity edema    ED Discharge Orders    None       Tacy Learn, PA-C 11/09/18 1534    Lajean Saver, MD 11/09/18 1949

## 2018-11-09 NOTE — ED Triage Notes (Signed)
Pt in from PCP office from check up today for gallbladder surgery and was noted having a fast HR that went down with deep inspiration per family, pt sent here for eval and reports having exertional SOB, pt reports bil leg swelling, A&O x4, denies CP

## 2018-11-09 NOTE — Progress Notes (Signed)
Alexa Mcdonald is a 85 y.o. female patient admitted from ED awake, alert - oriented  X 4 - no acute distress noted.  VSS - Blood pressure (!) 157/59, pulse (!) 58, temperature 98.9 F (37.2 C), temperature source Oral, resp. rate 20, SpO2 97 %.    IV in place, occlusive dsg intact without redness.  Orientation to room, and floor completed with information packet given to patient. Admission INP armband ID verified with patient/family, and in place. Fall assessment complete, with patient and family able to verbalize understanding of risk associated with falls, and verbalized understanding to call nsg before up out of bed.  Call light within reach, patient able to voice, and demonstrate understanding.  Skin, clean-dry- intact without evidence of bruising, or skin tears.   No evidence of skin break down noted on exam.     Will cont to eval and treat per MD orders.  Patience Musca, RN 11/09/2018 6:25 PM

## 2018-11-09 NOTE — Telephone Encounter (Signed)
**Note De-Identified  Obfuscation** I left a detailed message on Alexa Mcdonald's VM stating that she can reach me at my personal phone number (I did leave my number for her to call me as I am working from home due to the Coronavirus) after 9 am tomorrow morning as I know this Savaysa PA is time sensitive and is important for the pt.  I will call Alexa Mcdonald back if I do not get a call from her in the morning.

## 2018-11-09 NOTE — H&P (Signed)
History and Physical    WOODROW DULSKI BXU:[id number] DOB: 11-12-1920 DOA: 11/09/2018  PCP: Jonathon Jordan, MD  Patient coming from: 6 office, likely her general surgeon's office  I have personally briefly reviewed patient's old medical records in [place]  Chief Complaint: Ankle edema and tachycardia noted this morning  HPI: Alexa Mcdonald is a 85 y.o. female with medical history significant of atrial fibrillation, chronic left bundle branch block, nonobstructive coronary artery disease, chronic kidney disease stage III, heart failure with preserved ejection fraction (last echo April 2019 with an EF of 60 to 65% and grade 1 diastolic dysfunction, aortic valve stenosis with history of T AVR, bradycardia, and diverticulosis who presents to the emergency department being sent in from her Doctors office with concerns for lower extremity edema and elevated heart rate.  Patient recently underwent laparoscopic cholecystectomy and was discharged home from the hospital on October 26, 2018, she had been on edoxaban as an outpatient this was held for the surgery and restarted at discharge.  Denies any complaints and does not know why she is in the emergency department.  She denies feeling short of breath, weak/dizzy/or lightheaded.  She notes the swelling on her legs but does not know how long it has been present.  Patient has had a bowel movement today but denies any bloody or black stools.  She denies fevers chills abdominal pain or vomiting.  ED Course: Patient noted to be pale with pitting edema of the bilateral lower extremities, rectal examination reveals a scant amount of firm dark stool which is Hemoccult positive, CBC reveals a hemoglobin of 7.6, last admission on March 20 hemoglobin was 14, discharged home with a hemoglobin of 11.8, CMP, chest x-ray and EKG are unremarkable.  1 unit of packed red blood cells was ordered in the emergency department but due to COVID-19 pandemic  patient was deemed not a candidate for blood transfusion as her hemoglobin is greater then 6 despite symptomatology.  Review of Systems: As per HPI otherwise all other systems reviewed and  negative.  The unreliable due to patient not knowing why she is here.  She does not carry a diagnosis of dementia but may have some.  Past Medical History:  Diagnosis Date  . 1st degree AV block   . Bradycardia   . Chronic diastolic heart failure ([place]) 02/07/2016  . CKD (chronic kidney disease), stage III ([place])   . Colon, diverticulosis   . Diabetes mellitus    diet controlled  . Esophageal reflux   . Essential hypertension   . GERD (gastroesophageal reflux disease)   . Hematochezia   . HOH (hard of hearing)    wears bilateral hearing aids  . Hx of orthostatic hypotension   . Interstitial lung disease ([place])   . LBBB (left bundle branch block)   . Long term current use of anticoagulant therapy   . Mediastinal goiter     recurrent s/p resection 1988  . Mild CAD   . Mitral regurgitation   . Osteopenia   . Osteoporosis   . Paroxysmal atrial fibrillation (HCC)    a. CHA2DS2VASc = 5-->coumadin;  b. previously on amio->d/c'd 11/2014 secondary to concern for possible amio lung.  . Pneumonia   . 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 Severe aortic stenosis   . Stroke ([place])   . Tachycardia-bradycardia ([place])   . Vitamin D deficiency  Past Surgical History:  Procedure Laterality Date  . BILIARY DILATION  10/24/2018   Procedure: BILIARY DILATION;  Surgeon: Jackquline Denmark, MD;  Location: [facility];  Service: Endoscopy;;  . CARDIAC CATHETERIZATION N/A 10/31/2015   Procedure: Right/Left Heart Cath and Coronary Angiography;  Surgeon: [name], MD;  Location: [facility];  Service: Cardiovascular;  Laterality: N/A;  . CARPAL TUNNEL RELEASE Bilateral   . CATARACT  EXTRACTION Bilateral   . CESAREAN SECTION     x3  . CHOLECYSTECTOMY N/A 10/25/2018   Procedure: LAPAROSCOPIC CHOLECYSTECTOMY WITH INTRAOPERATIVE CHOLANGIOGRAM;  Surgeon: Kieth Brightly Arta Bruce, MD;  Location: [facility];  Service: General;  Laterality: N/A;  . ERCP N/A 10/24/2018   Procedure: ENDOSCOPIC RETROGRADE CHOLANGIOPANCREATOGRAPHY (ERCP);  Surgeon: Jackquline Denmark, MD;  Location: [facility];  Service: Endoscopy;  Laterality: N/A;  . mediastinal removal of a goiter    . MRI  08/18/07   head (diabetes heart study at [facility])  . REMOVAL OF STONES  10/24/2018   Procedure: REMOVAL OF STONES;  Surgeon: Jackquline Denmark, MD;  Location: [facility];  Service: Endoscopy;;  . Joan Mayans  10/24/2018   Procedure: Joan Mayans;  Surgeon: Jackquline Denmark, MD;  Location: [facility];  Service: Endoscopy;;  . TEE WITHOUT CARDIOVERSION N/A 11/27/2015   Procedure: TRANSESOPHAGEAL ECHOCARDIOGRAM (TEE);  Surgeon: [name], MD;  Location: [facility];  Service: Open Heart Surgery;  Laterality: N/A;  . TRANSCATHETER AORTIC VALVE REPLACEMENT, TRANSFEMORAL N/A 11/27/2015   Procedure: TRANSCATHETER AORTIC VALVE REPLACEMENT, TRANSFEMORAL;  Surgeon: [name], MD;  Location: [place];  Service: Open Heart Surgery;  Laterality: N/A;    Social History   Social History Narrative   Lives in [place].  Widowed.     reports that she has never smoked. She has never used smokeless tobacco. She reports that she does not drink alcohol or use drugs.  Allergies  Allergen Reactions  . Amoxicillin Other (See Comments)    tongue felt bruised Did it involve swelling of the face/tongue/throat, SOB, or low BP? No Did it involve sudden or severe rash/hives, skin peeling, or any reaction on the inside of your mouth or nose? No Did you need to seek medical attention at a hospital or doctor's office? No When did it last happen?childhood If all above answers are "NO", may proceed with cephalosporin use.    . Celebrex  [Celecoxib] Other (See Comments)    Mouth sores    Family History  Problem Relation Age of Onset  . Diabetes Mother   . Cancer - Prostate Father   . Pulmonary embolism Father   . Hypertension Sister   . Diabetes Sister   . Hypertension Brother   . Arrhythmia Brother        afib  . CAD Brother   . Pulmonary fibrosis Brother      Prior to Admission medications   Medication Sig Start Date End Date Taking? Authorizing Provider  acetaminophen (TYLENOL) 325 MG tablet Take 2 tablets (650 mg total) by mouth every 6 (six) hours as needed for mild pain. 10/26/18  Yes Swayze, Ava, DO  edoxaban (SAVAYSA) 30 MG TABS tablet Take 1 tablet (30 mg total) by mouth daily. 11/02/18  Yes [name], MD  aspirin EC 81 MG tablet Take 81 mg by mouth daily.    [provider]  atorvastatin (LIPITOR) 10 MG tablet Take 10 mg daily by mouth.    [provider]  lisinopril (PRINIVIL,ZESTRIL) 10 MG tablet Take 1 tablet (10 mg total) by mouth daily.  Please call our office to schedule an appt for future refills (2nd attempt) 08/02/18   Dunn, Nedra Hai, PA-C  Multiple Vitamins-Minerals (OCUVITE PRESERVISION PO) Take 1 capsule by mouth daily.     [provider]  traMADol (ULTRAM) 50 MG tablet Take 1 tablet (50 mg total) by mouth every 6 (six) hours as needed for moderate pain. 10/26/18   Swayze, Ava, DO  VITAMIN D PO Take 1 tablet by mouth daily.    [provider]    Physical Exam:  Constitutional: NAD, calm, comfortable Vitals:   11/09/18 1253 11/09/18 1415 11/09/18 1500 11/09/18 1530  BP: (!) 160/55 (!) 162/61 (!) 166/57 (!) 143/75  Pulse: (!) 118 (!) 56 (!) 53 (!) 56  Resp: 16 19 18  (!) 23  Temp: 98.3 F (36.8 C)     TempSrc: Oral     SpO2: 100% 100% 99% 100%   Eyes: PERRL, lids and conjunctivae pale, sclerae are white ENMT: Mucous membranes are moist and pale. Posterior pharynx clear of any exudate or lesions.Normal dentition.  Neck: normal, supple, no masses, no  thyromegaly Respiratory: clear to auscultation bilaterally, no wheezing, no crackles. Normal respiratory effort. No accessory muscle use.  Cardiovascular: Irregularly irregular rate and rhythm, no murmurs / rubs / gallops.  2+ extremity edema. 2+ pedal pulses. No carotid bruits.  Abdomen: no tenderness, no masses palpated. No hepatosplenomegaly. Bowel sounds positive.  Stool examination by ED revealed heme positive stool Musculoskeletal: no clubbing / cyanosis. No joint deformity upper and lower extremities. Good ROM, no contractures. Normal muscle tone.  Skin: no rashes, lesions, ulcers. No induration Neurologic: CN 2-12 grossly intact. Sensation intact, DTR normal. Strength 5/5 in all 4.  Psychiatric: Question judgment and insight patient does not know why she is in the emergency department. Alert and oriented x 3. Normal mood was not and socially intact.    Labs on Admission: I have personally reviewed following labs and imaging studies  CBC: Recent Labs  Lab 11/09/18 1315  WBC 9.4  NEUTROABS 8.0*  HGB 7.6*  HCT 24.8*  MCV 95.0  PLT 644*   Basic Metabolic Panel: Recent Labs  Lab 11/09/18 1315  NA 136  K 4.2  CL 103  CO2 24  GLUCOSE 147*  BUN 18  CREATININE 1.28*  CALCIUM 8.3*   GFR: CrCl cannot be calculated (Unknown ideal weight.). Liver Function Tests: Recent Labs  Lab 11/09/18 1315  AST 17  ALT 19  ALKPHOS 84  BILITOT 1.1  PROT 6.2*  ALBUMIN 3.2*   Urine analysis:    Component Value Date/Time   COLORURINE YELLOW 10/22/2018 2341   APPEARANCEUR HAZY (A) 10/22/2018 2341   LABSPEC 1.017 10/22/2018 2341   PHURINE 5.0 10/22/2018 2341   GLUCOSEU 50 (A) 10/22/2018 2341   HGBUR MODERATE (A) 10/22/2018 2341   BILIRUBINUR NEGATIVE 10/22/2018 2341   KETONESUR 20 (A) 10/22/2018 2341   PROTEINUR 30 (A) 10/22/2018 2341   NITRITE NEGATIVE 10/22/2018 2341   LEUKOCYTESUR NEGATIVE 10/22/2018 2341    Radiological Exams on Admission: Dg Chest 2 View  Result Date:  11/09/2018 CLINICAL DATA:  Pt stated she didn't know why she was here and has no current or previous chest complaints, HTN, diabetic, nonsmoker EXAM: CHEST - 2 VIEW COMPARISON:  11/28/2015 FINDINGS: Cardiac silhouette is normal in size. There are stable changes from endovascular aortic valve replacement. No mediastinal or hilar masses. There is no evidence of adenopathy. There are prominent bronchovascular and interstitial lung markings similar to prior exams. No  lung consolidation. No pleural effusion or pneumothorax. Skeletal structures are demineralized but grossly intact. IMPRESSION: No acute cardiopulmonary disease. Electronically Signed   By: Lajean Manes M.D.   On: 11/09/2018 13:59    EKG: Independently reviewed.  Atrial fibrillation with left bundle branch block no change when compared to prior  Assessment/Plan Principal Problem:   Acute blood loss anemia Active Problems:   Edema of both ankles   Acute on chronic diastolic CHF (congestive heart failure) (HCC)   Acute lower GI bleeding   Symptomatic anemia   Bradycardia   Long term current use of anticoagulant therapy   Atrial fibrillation, persistent   Essential hypertension   GERD (gastroesophageal reflux disease)   Interstitial lung disease (HCC)   S/P TAVR (transcatheter aortic valve replacement)   1.  Acute blood loss anemia: Transfusions ordered but have been refused due to COVID-19 pandemic.  Will monitor patient's hemoglobin and hematocrit every 6 hours for 6 more lab draws.  If hemoglobin drops below 6 we will transfuse 1 unit of packed red blood cells.  2.  Acute on chronic diastolic congestive heart failure with bilateral ankle edema: Patient is not hypoxic.  She does have some oxygen in place.  I have ordered Lasix 40 mg IV x1 to be given today.  I have also ordered Lasix twice daily to start tomorrow morning.  Monitor intake and output closely as well as creatinine.  If patient requires blood transfusion will need close  cardiac monitoring.  Last echocardiogram in April 2019 showed an ejection fraction of 60 to 65% with grade 1 diastolic dysfunction.  Echo not ordered as I believe that her etiology of her exacerbation is related to her anemia  3.  Symptomatic anemia with acute lower GI bleeding: Patient is symptomatic and the fact that she is having high output congestive heart failure.  [facility] has been consulted.  They saw patient last admission when she had an ERCP.  Patient does carry a diagnosis of diverticulosis.  This may be the etiology of her bleeding.  Discontinue anticoagulation.  Will give IV Protonix milligrams every 12 hours.  Patient likely no longer a candidate.  4.  Bradycardia with persistent atrial fibrillation: Noted rate is currently controlled.  Patient was anticoagulated with edoxaban.  This will be discontinued.  5.  Long-term use of anticoagulant therapy: This was for atrial fibrillation but will need to be discontinued due to massive GI bleeding.  Between March 20 and today patient has lost approximately 7 units of blood.  6.  Essential hypertension: Despite blood loss patient's blood pressure remains hearty at 140 over 70s when I was in the room.  Will continue to monitor.  Will likely hold blood pressure medicines for 24 hours due to blood loss to ensure that she does not become hypotensive.  7.  Gastroesophageal reflux disease: Noted continue home medication management.  8.  Interstitial lung disease: Noted continue home management.  9.  Status post transcatheter aortic valve replacement: Noted.  DVT prophylaxis: SCDs Code Status: Full code, palliative care has been consulted regarding goals of care. Family Communication: Attempted phone call to patient's son, Adeja Sarratt, [phone].  No answer notified patient's nurse.  I left nurses phone # for Mr. Zachery Dakins to call back Disposition Plan: Likely home in 2 to 3 days Consults called: GI, Maryanna Shape; palliative  care Admission status: Inpatient   Lady Deutscher MD [facility] Pager [phone]  How to contact the [facility] Attending or  Consulting provider [place] or covering provider during after hours [place], for this patient?  1. Check the care team in [facility] and look for a) attending/consulting [facility] provider listed and b) the [facility] team listed 2. Log into [email or web address] and use [facility] universal password to access. If you do not have the password, please contact the hospital operator. 3. Locate the [facility] provider you are looking for under [facility] and page to a number that you can be directly reached. 4. If you still have difficulty reaching the provider, please page the [facility] (Director on Call) for the Hospitalists listed on amion for assistance.  If 7PM-7AM, please contact night-coverage [email or web address] Password [facility]  11/09/2018, 3:47 PM

## 2018-11-09 NOTE — ED Notes (Signed)
Pt's son Barry Dienes [phone]. Pls contact for information and D/C

## 2018-11-09 NOTE — Consult Note (Signed)
[facility] Consult: 3:44 PM 11/09/2018  LOS: 0 days    Referring Provider: Dr. Ashok Cordia Primary Care Physician:  Jonathon Jordan, MD [facility] primary care. Primary Gastroenterologist:  Dr. Lyndel Safe new to him as of 10/2018.    Reason for Consultation: Anemia.  FOBT positive.   HPI: Alexa Mcdonald is a 85 y.o. female.  History A. fib.  S/P TAVR.  On chronic Edoxaban (NOAC).   CKD, stage III.   No information regarding previous EGD or colonoscopy found in epic.   Admitted 3/20 -09/27/2018 with choledocholithiasis. 10/24/18 ERCP with sphincterotomy and stone removal. 10/25/18 Laparoscopic cholecystectomy Discharged on 81 ASA , Keflex and metronidazole x4 days resumed anticoagulation 10/28/2018.  Seen at surgery office today for routine wound check.  The wound looked good, she passed with flying colors.  However surgery was concerned by her pulse of 172, blood pressure 134/96.  Surgeon advised her to call her PMD.  When she called Dr. Cheron Schaumann office she spoke with the nurse.  She mentioned 3 days of weeping fluid out of her swollen legs and some skin breakdown on the back of her legs.  She was not having any dizziness, fatigue, sense of tachycardia, shortness of breath.  I spoke with her son.  There is been no notice of dark or tarry stools though the patient does go to the bathroom by herself.  Since discharge she has had a nursing assistant with her from 9-5 daily and family member from [place] until 9 in the morning.  The son mentions that the mom seems to have had some cognitive decline since her surgery, she is obsessing that she cannot hear with her hearing aids that the batteries are poor.  She changes the batteries every 20 minutes but still says she cannot hear well.  Is been eating well.  Not complaining of anything in  particular in terms of GI issues.  In the ED Hgb is 7.6, compares to 11.8 on 10/26/2018.  No elevation of her white count. Platelets 568.  BUN not elevated. Stool is FOBT positive at the lab. Heart rate initially 118 but now in the 50s.  Blood pressure in the 150s to 160s/50s to 70s.  Room air saturation at 100%. Telemetry monitor shows atrial fibrillation.   Past Medical History:  Diagnosis Date   1st degree AV block    Bradycardia    Chronic diastolic heart failure ([place]) 02/07/2016   CKD (chronic kidney disease), stage III (HCC)    Colon, diverticulosis    Diabetes mellitus    diet controlled   Esophageal reflux    Essential hypertension    GERD (gastroesophageal reflux disease)    Hematochezia    HOH (hard of hearing)    wears bilateral hearing aids   Hx of orthostatic hypotension    Interstitial lung disease (HCC)    LBBB (left bundle branch block)    Long term current use of anticoagulant therapy    Mediastinal goiter     recurrent s/p resection 1988   Mild CAD    Mitral regurgitation  Osteopenia    Osteoporosis    Paroxysmal atrial fibrillation (HCC)    a. CHA2DS2VASc = 5-->coumadin;  b. previously on amio->d/c'd 11/2014 secondary to concern for possible amio lung.   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   23 mm Edwards Sapien 3 transcatheter heart valve placed via percutaneous right transfemoral approach   Severe aortic stenosis    Stroke ([place])    Tachycardia-bradycardia ([place])    Vitamin D deficiency     Past Surgical History:  Procedure Laterality Date   BILIARY DILATION  10/24/2018   Procedure: BILIARY DILATION;  Surgeon: Jackquline Denmark, MD;  Location: [facility];  Service: Endoscopy;;   CARDIAC CATHETERIZATION N/A 10/31/2015   Procedure: Right/Left Heart Cath and Coronary Angiography;  Surgeon: [name], MD;  Location: [facility] [facility];  Service: Cardiovascular;  Laterality: N/A;   CARPAL  TUNNEL RELEASE Bilateral    CATARACT EXTRACTION Bilateral    CESAREAN SECTION     x3   CHOLECYSTECTOMY N/A 10/25/2018   Procedure: LAPAROSCOPIC CHOLECYSTECTOMY WITH INTRAOPERATIVE CHOLANGIOGRAM;  Surgeon: Kieth Brightly Arta Bruce, MD;  Location: [facility];  Service: General;  Laterality: N/A;   ERCP N/A 10/24/2018   Procedure: ENDOSCOPIC RETROGRADE CHOLANGIOPANCREATOGRAPHY (ERCP);  Surgeon: Jackquline Denmark, MD;  Location: [facility];  Service: Endoscopy;  Laterality: N/A;   mediastinal removal of a goiter     MRI  08/18/07   head (diabetes heart study at [facility])   [place]  10/24/2018   Procedure: REMOVAL OF STONES;  Surgeon: Jackquline Denmark, MD;  Location: [facility];  Service: Endoscopy;;   SPHINCTEROTOMY  10/24/2018   Procedure: Joan Mayans;  Surgeon: Jackquline Denmark, MD;  Location: [facility];  Service: Endoscopy;;   TEE WITHOUT CARDIOVERSION N/A 11/27/2015   Procedure: TRANSESOPHAGEAL ECHOCARDIOGRAM (TEE);  Surgeon: [name], MD;  Location: [place];  Service: Open Heart Surgery;  Laterality: N/A;   TRANSCATHETER AORTIC VALVE REPLACEMENT, TRANSFEMORAL N/A 11/27/2015   Procedure: TRANSCATHETER AORTIC VALVE REPLACEMENT, TRANSFEMORAL;  Surgeon: [name], MD;  Location: [place];  Service: Open Heart Surgery;  Laterality: N/A;    Prior to Admission medications   Medication Sig Start Date End Date Taking? Authorizing Provider  acetaminophen (TYLENOL) 325 MG tablet Take 2 tablets (650 mg total) by mouth every 6 (six) hours as needed for mild pain. 10/26/18  Yes Swayze, Ava, DO  edoxaban (SAVAYSA) 30 MG TABS tablet Take 1 tablet (30 mg total) by mouth daily. 11/02/18  Yes [name], MD  aspirin EC 81 MG tablet Take 81 mg by mouth daily.    [provider]  atorvastatin (LIPITOR) 10 MG tablet Take 10 mg daily by mouth.    [provider]  lisinopril (PRINIVIL,ZESTRIL) 10 MG tablet Take 1 tablet (10 mg total) by mouth daily. Please call our office to schedule  an appt for future refills (2nd attempt) 08/02/18   Dunn, Nedra Hai, PA-C  Multiple Vitamins-Minerals (OCUVITE PRESERVISION PO) Take 1 capsule by mouth daily.     [provider]  traMADol (ULTRAM) 50 MG tablet Take 1 tablet (50 mg total) by mouth every 6 (six) hours as needed for moderate pain. 10/26/18   Swayze, Ava, DO  VITAMIN D PO Take 1 tablet by mouth daily.    [provider]    Scheduled Meds:  furosemide  40 mg Intravenous Once   Infusions:  sodium chloride     PRN Meds:    Allergies as of 11/09/2018 - Review Complete 11/09/2018  Allergen Reaction Noted   Amoxicillin Other (See Comments) 11/25/2011   Celebrex [celecoxib] Other (See Comments) 11/25/2011    Family History  Problem Relation Age of Onset   Diabetes Mother    Cancer - Prostate Father    Pulmonary embolism Father    Hypertension Sister    Diabetes Sister    Hypertension Brother    Arrhythmia Brother        afib   CAD Brother    Pulmonary fibrosis Brother     Social History   Socioeconomic History   Marital status: Widowed    Spouse name: Not on file   Number of children: 3   Years of education: Not on file   Highest education level: Not on file  Occupational History   Occupation: retire  Social Designer, fashion/clothing strain: Not on file   Food insecurity:    Worry: Not on file    Inability: Not on file   Transportation needs:    Medical: Not on file    Non-medical: Not on file  Tobacco Use   Smoking status: Never Smoker   Smokeless tobacco: Never Used  Substance and Sexual Activity   Alcohol use: No    Alcohol/week: 0.0 standard drinks   Drug use: No   Sexual activity: Not on file  Lifestyle   Physical activity:    Days per week: Not on file    Minutes per session: Not on file   Stress: Not on file  Relationships   Social connections:    Talks on phone: Not on file    Gets together: Not on file    Attends religious service: Not  on file    Active member of club or organization: Not on file    Attends meetings of clubs or organizations: Not on file    Relationship status: Not on file   Intimate partner violence:    Fear of current or ex partner: Not on file    Emotionally abused: Not on file    Physically abused: Not on file    Forced sexual activity: Not on file  Other Topics Concern   Not on file  Social History Narrative   Lives in [place].  Widowed.    REVIEW OF SYSTEMS: Constitutional: Per HPI. ENT:  No nose bleeds Pulm: Per HPI. CV:  No palpitations, + LE edema with weeping laterally..  GU:  No hematuria, no frequency GI: Per HPI Heme: Denies unusual bleeding or bruising. Transfusions: None the past.  He is about to receive 1 unit of PRBCs. Neuro:  No headaches, no peripheral tingling or numbness Derm:  No itching, no rash.  .  Endocrine:  No sweats or chills.  No polyuria or dysuria Immunization: Reviewed. Travel:  None beyond local counties in last few months.    PHYSICAL EXAM: Vital signs in last 24 hours: Vitals:   11/09/18 1500 11/09/18 1530  BP: (!) 166/57 (!) 143/75  Pulse: (!) 53 (!) 56  Resp: 18 (!) 23  Temp:    SpO2: 99% 100%   Wt Readings from Last 3 Encounters:  10/26/18 65.2 kg  06/11/18 60.1 kg  11/20/17 67.1 kg    General: Aged patient but does not look her age.  Somewhat pale.  Alert.  A bit fretful. Head: No facial asymmetry or swelling.  No signs of head trauma. Eyes: Scleral icterus.  No conjunctival pallor. Ears: Hard of hearing Nose: No discharge Mouth: Moist, pink, clear oropharynx.  Tongue midline.  Good dentition. Neck: No JVD, no masses, no thyromegaly. Lungs: No labored breathing or cough.  Lungs clear bilaterally. Heart: Irregularly irregular.  Rate not accelerated. Abdomen: Soft.  Not tender or distended.  Well-healed laparoscopy scars.  Active bowel sounds.  No distention..   Rectal: Small amounts of hard, difficult to sample stool which is dark  brown, FOBT positive. Musc/Skeltl: No joint redness, swelling, gross deformity. Extremities: Lower extremity edema with weeping. Neurologic: Oriented to hospital, self.  Knew it was 2000 and something but told me she looked it up last week but could not remember exactly what year.  Follows all commands.  No tremor.  No gross weakness. Skin: Lower extremity weeping. Tattoos: None Nodes: No cervical adenopathy Psych: Cooperative, pleasant.  Intake/Output from previous day: No intake/output data recorded. Intake/Output this shift: No intake/output data recorded.  LAB RESULTS: Recent Labs    11/09/18 1315  WBC 9.4  HGB 7.6*  HCT 24.8*  PLT 568*   BMET Lab Results  Component Value Date   NA 136 11/09/2018   NA 139 10/26/2018   NA 138 10/25/2018   K 4.2 11/09/2018   K 3.6 10/26/2018   K 3.7 10/25/2018   CL 103 11/09/2018   CL 112 (H) 10/26/2018   CL 111 10/25/2018   CO2 24 11/09/2018   CO2 17 (L) 10/26/2018   CO2 17 (L) 10/25/2018   GLUCOSE 147 (H) 11/09/2018   GLUCOSE 125 (H) 10/26/2018   GLUCOSE 181 (H) 10/25/2018   BUN 18 11/09/2018   BUN 32 (H) 10/26/2018   BUN 35 (H) 10/25/2018   CREATININE 1.28 (H) 11/09/2018   CREATININE 1.29 (H) 10/26/2018   CREATININE 1.52 (H) 10/25/2018   CALCIUM 8.3 (L) 11/09/2018   CALCIUM 7.7 (L) 10/26/2018   CALCIUM 7.6 (L) 10/25/2018   LFT Recent Labs    11/09/18 1315  PROT 6.2*  ALBUMIN 3.2*  AST 17  ALT 19  ALKPHOS 84  BILITOT 1.1   PT/INR Lab Results  Component Value Date   INR 1.3 12/24/2015   INR 1.44 11/29/2015   INR 1.65 (H) 11/27/2015   Hepatitis Panel No results for input(s): HEPBSAG, HCVAB, HEPAIGM, HEPBIGM in the last 72 hours. C-Diff No components found for: CDIFF Lipase     Component Value Date/Time   LIPASE 30 10/22/2018 2036    Drugs of Abuse  No results found for: LABOPIA, COCAINSCRNUR, LABBENZ, AMPHETMU, THCU, LABBARB   RADIOLOGY STUDIES: Dg Chest 2 View  Result Date: 11/09/2018 CLINICAL  DATA:  Pt stated she didn't know why she was here and has no current or previous chest complaints, HTN, diabetic, nonsmoker EXAM: CHEST - 2 VIEW COMPARISON:  11/28/2015 FINDINGS: Cardiac silhouette is normal in size. There are stable changes from endovascular aortic valve replacement. No mediastinal or hilar masses. There is no evidence of adenopathy. There are prominent bronchovascular and interstitial lung markings similar to prior exams. No lung consolidation. No pleural effusion or pneumothorax. Skeletal structures are demineralized but grossly intact. IMPRESSION: No acute cardiopulmonary disease. Electronically Signed   By: Lajean Manes M.D.   On: 11/09/2018 13:59     IMPRESSION:   *    Acute anemia.  Surprisingly not symptomatic other than the pulse recorded in the 170s at her doctor's office today though the accompanying blood pressure was actually hypertensive, not hypotensive. Stool FOBT positive but not having melena. No abdominal pain to suggest a retroperitoneal or intra-abdominal bleed. ? post sphincterotomy bleeding?  *   10/24/2018 ERCP with  sphincterotomy and stone removal.  *     10/25/18 laparoscopic cholecystectomy.  Everything looked good at postsurgical office visit today  *    Chronic NOAC for A fib.    *   Stage III chronic kidney disease.  BUN/creatinine today improved compared with recent hospitalization.   PLAN:     *   Will probably need EGD for investigation of GI bleeding.   *   Given the significant decline in her Hgb, ?  Should we image the abdomen to assess for retroperitoneal or other intra-abdominal bleed.   Azucena Freed  11/09/2018, 3:44 PM Phone [phone]

## 2018-11-10 DIAGNOSIS — Z515 Encounter for palliative care: Secondary | ICD-10-CM

## 2018-11-10 DIAGNOSIS — I5033 Acute on chronic diastolic (congestive) heart failure: Secondary | ICD-10-CM

## 2018-11-10 DIAGNOSIS — R001 Bradycardia, unspecified: Secondary | ICD-10-CM

## 2018-11-10 DIAGNOSIS — K922 Gastrointestinal hemorrhage, unspecified: Secondary | ICD-10-CM

## 2018-11-10 DIAGNOSIS — Z7189 Other specified counseling: Secondary | ICD-10-CM

## 2018-11-10 DIAGNOSIS — M25471 Effusion, right ankle: Secondary | ICD-10-CM

## 2018-11-10 DIAGNOSIS — I4819 Other persistent atrial fibrillation: Secondary | ICD-10-CM

## 2018-11-10 DIAGNOSIS — K219 Gastro-esophageal reflux disease without esophagitis: Secondary | ICD-10-CM

## 2018-11-10 DIAGNOSIS — M25472 Effusion, left ankle: Secondary | ICD-10-CM

## 2018-11-10 DIAGNOSIS — I10 Essential (primary) hypertension: Secondary | ICD-10-CM

## 2018-11-10 DIAGNOSIS — R600 Localized edema: Secondary | ICD-10-CM

## 2018-11-10 LAB — TYPE AND SCREEN
ABO/RH(D): O POS
Antibody Screen: NEGATIVE
Unit division: 0

## 2018-11-10 LAB — FERRITIN: Ferritin: 19 ng/mL (ref 11–307)

## 2018-11-10 LAB — IRON AND TIBC
Iron: 35 ug/dL (ref 28–170)
Saturation Ratios: 11 % (ref 10.4–31.8)
TIBC: 318 ug/dL (ref 250–450)
UIBC: 283 ug/dL

## 2018-11-10 LAB — BASIC METABOLIC PANEL
Anion gap: 11 (ref 5–15)
BUN: 18 mg/dL (ref 8–23)
CO2: 24 mmol/L (ref 22–32)
Calcium: 8.1 mg/dL — ABNORMAL LOW (ref 8.9–10.3)
Chloride: 101 mmol/L (ref 98–111)
Creatinine, Ser: 1.42 mg/dL — ABNORMAL HIGH (ref 0.44–1.00)
GFR calc Af Amer: 36 mL/min — ABNORMAL LOW (ref >60)
GFR calc non Af Amer: 31 mL/min — ABNORMAL LOW (ref >60)
Glucose, Bld: 99 mg/dL (ref 70–99)
Potassium: 3.8 mmol/L (ref 3.5–5.1)
Sodium: 136 mmol/L (ref 135–145)

## 2018-11-10 LAB — CBC
HCT: 25.7 % — ABNORMAL LOW (ref 36.0–46.0)
Hemoglobin: 8.2 g/dL — ABNORMAL LOW (ref 12.0–15.0)
MCH: 28.8 pg (ref 26.0–34.0)
MCHC: 31.9 g/dL (ref 30.0–36.0)
MCV: 90.2 fL (ref 80.0–100.0)
Platelets: 450 10*3/uL — ABNORMAL HIGH (ref 150–400)
RBC: 2.85 MIL/uL — ABNORMAL LOW (ref 3.87–5.11)
RDW: 17 % — ABNORMAL HIGH (ref 11.5–15.5)
WBC: 7.4 10*3/uL (ref 4.0–10.5)
nRBC: 0 % (ref 0.0–0.2)

## 2018-11-10 LAB — SAMPLE TO BLOOD BANK

## 2018-11-10 LAB — BPAM RBC
Blood Product Expiration Date: 202004282359
ISSUE DATE / TIME: 202004072311
Unit Type and Rh: 5100

## 2018-11-10 LAB — BRAIN NATRIURETIC PEPTIDE: B Natriuretic Peptide: 550 pg/mL — ABNORMAL HIGH (ref 0.0–100.0)

## 2018-11-10 LAB — HEMOGLOBIN AND HEMATOCRIT, BLOOD
HCT: 29.1 % — ABNORMAL LOW (ref 36.0–46.0)
Hemoglobin: 9.5 g/dL — ABNORMAL LOW (ref 12.0–15.0)

## 2018-11-10 MED ORDER — METHIMAZOLE 5 MG PO TABS
7.5000 mg | ORAL_TABLET | Freq: Every day | ORAL | Status: DC
  Filled 2018-11-10 (×2): qty 2

## 2018-11-10 MED ORDER — METHIMAZOLE 5 MG PO TABS
7.5000 mg | ORAL_TABLET | Freq: Every day | ORAL | Status: DC
  Administered 2018-11-10 – 2018-11-12 (×3): 7.5 mg via ORAL
  Filled 2018-11-10 (×4): qty 2

## 2018-11-10 MED ORDER — ATORVASTATIN CALCIUM 10 MG PO TABS
10.0000 mg | ORAL_TABLET | Freq: Every day | ORAL | Status: DC
  Administered 2018-11-11 – 2018-11-13 (×3): 10 mg via ORAL
  Filled 2018-11-10 (×3): qty 1

## 2018-11-10 NOTE — Telephone Encounter (Signed)
**Note De-Identified  Obfuscation** I did a Savaysa appeal over the phone with Bessie at [employer]. Per Bessie she is sending this appeal for review and that they will notify us with decision.

## 2018-11-10 NOTE — Telephone Encounter (Signed)
**Note De-Identified  Obfuscation** Alexa Mcdonald left a message on my VM yesterday at 5:11 pm to call her back. I have called and left her a message to call me back concerning the pts Savaysa.

## 2018-11-10 NOTE — Consult Note (Signed)
Consultation Note Date: 11/10/2018   Patient Name: Alexa Mcdonald  DOB: 03-28-1921  MRN: [id number]  Age / Sex: 85 y.o., female   PCP: Jonathon Jordan, MD Referring Physician: Patrecia Pour, MD   REASON FOR CONSULTATION:Establishing goals of care   Palliative Care consult requested for this 85 y.o. female with multiple medical problems including chronic kidney disease stage III, diastolic congestive heart failure (EF 60-65% April 2019), left bundle branch block, atrial fibrillation (Edoxaban), nonobstructive coronary artery disease, aortic valve stenosis with history of TAVR, bradycardia, diverticulosis, GERD, hard of hearing (bilateral hearing aids), CVA, s/p ERCP with sphincterotomy and stone removal (10/24/18), laparoscopic cholecystectomy (10/25/18), and interstitial lung disease.   She was sent to ED from her surgeon's office due to concerns for lower extremity edema and tachycardia (172). Since admission she has been evaluated by Gastroenterology due to anemia with on admission Hgb 7.6. Per notation GI plans are to perform and upper endoscopy as patient is not interested in colonoscopy.   I have reviewed medical records including lab results, imaging, Epic notes, and MAR, received report from the bedside RN, and assessed the patient. I met at the bedside with patient and son, Monie Shere via phone at the bedside to discuss diagnosis prognosis, [place], EOL wishes, disposition and options.  I introduced Palliative Medicine as specialized medical care for people living with serious illness. It focuses on providing relief from the symptoms and stress of a serious illness. The goal is to improve quality of life for both the patient and the family.  Alexa Mcdonald is aware, alert, and oriented x4. She is able to sit on the side of the bed. Assistance given with walker to ambulate to bathroom. She denied shortness of breath or pain.   She reports she has 3 sons, one of whom lives locally and  the other 2 live in [place] and [place]. She lives by herself with frequent support from her son and granddaughter. She is a retired Therapist, music. She reports she has lived by herself for the last 51 years and enjoys gardening, reading, and spending time with family.  Prior to admission she was able to perform most ADLs independently. She does not drive. She reports she has a walker but seldom used in the home, moreso with provider appointments if she had to walk long-distances. She and son both reports she has a good appetite and would often snack throughout the day.   We discussed her current illness and what it means in the larger context of her on-going co-morbidities. Specifically in regards to her congestive heart failure, CAD, and acute blood loss  Natural disease trajectory and expectations at EOL were discussed. Alexa Mcdonald expressed not being interested in colonoscopy due to the preparation of testing. Son agreed and stated if highly recommended by medical team they would reconsider at that time.   I attempted to elicit values and goals of care important to the patient.    The difference between aggressive medical intervention and comfort care was considered in light of the patient's goals of care. Patient and son reports their goal is for her to get somewhat better and return home with home health.   Alexa Mcdonald joking laughed and stated "I know I am not a young spring chicken and 85 years old but I feel like I am doing good for my age!" She expressed her wishes are to continue to treat medical conditions at this time and make decisions regarding limitations  as they are needed. Son verbalized his agreement.   Both patient and son verbalized she has a living will which identifies Alexa Mcdonald as the [place], however they express she does not have a specific document specifying wishes in the event of a emergent cardiac or respiratory event. I discussed her current full code status in detail  with consideration to her current illness and co-morbidities. [name] expressed she wished to continue with aggressive interventions including life-support, CPR, defibrillation, and ACLS. Son agreed and expressed "as weird as it may sounds with her being 84 years old we have not had a serious conversation regarding what to doe in this situation". He reports they have really thought more along the lines that one day she may not wake up. I used this opportunity to discuss his statement,given if she did not wake up would they want medical interventions such as heroic measures. Son reported at home probably not but in the hospital yes. Patient expressed this would be her wishes also until she was able to have a serious conversation with all of her sons. Alexa Mcdonald, agreed and stated they would discuss as a family and until then they both request all interventions and if something was to happen emergent they would cross that line when they got there.   Hospice and Palliative Care services outpatient were explained and offered. Patient verbalized her understanding and awareness of both hospice and palliative's goals and philosophy of care. At this time she verbalizes wishes to have outpatient palliative support at discharge.   Questions and concerns were addressed.  Hard Choices booklet left for review. The family was encouraged to call with questions or concerns.  PMT will continue to support holistically.   SOCIAL HISTORY:     reports that she has never smoked. She has never used smokeless tobacco. She reports that she does not drink alcohol or use drugs.  ADVANCE DIRECTIVES:  Primary Decision Maker: Patient  HCPOA: Valentino Nose per patient/son/advanced directive    CODE STATUS: Full code  SYMPTOM MANAGEMENT: Per attending    PSYCHO-SOCIAL/SPIRITUAL:  Support System: Son Alexa Mcdonald and granddaughter Alexa Mcdonald  Desire for further Chaplaincy support:NO    PAST MEDICAL HISTORY: Past Medical  History:  Diagnosis Date   1st degree AV block    Bradycardia    Chronic diastolic heart failure ([facility] [facility]) 02/07/2016   CKD (chronic kidney disease), stage III (HCC)    Colon, diverticulosis    Diabetes mellitus    diet controlled   Esophageal reflux    Essential hypertension    GERD (gastroesophageal reflux disease)    Hematochezia    HOH (hard of hearing)    wears bilateral hearing aids   Hx of orthostatic hypotension    Interstitial lung disease (HCC)    LBBB (left bundle branch block)    Long term current use of anticoagulant therapy    Mediastinal goiter     recurrent s/p resection 1988   Mild CAD    Mitral regurgitation    Osteopenia    Osteoporosis    Paroxysmal atrial fibrillation ([place])    a. CHA2DS2VASc = 5-->coumadin;  b. previously on amio->d/c'd 11/2014 secondary to concern for possible amio lung.   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   23 mm Edwards Sapien 3 transcatheter heart valve placed via percutaneous right transfemoral approach   Severe aortic stenosis    Stroke ([place])    Tachycardia-bradycardia (HCC)    Vitamin D deficiency  PAST SURGICAL HISTORY:  Past Surgical History:  Procedure Laterality Date   BILIARY DILATION  10/24/2018   Procedure: BILIARY DILATION;  Surgeon: Jackquline Denmark, MD;  Location: [facility];  Service: Endoscopy;;   CARDIAC CATHETERIZATION N/A 10/31/2015   Procedure: Right/Left Heart Cath and Coronary Angiography;  Surgeon: [name], MD;  Location: [facility];  Service: Cardiovascular;  Laterality: N/A;   CARPAL TUNNEL RELEASE Bilateral    CATARACT EXTRACTION Bilateral    CESAREAN SECTION     x3   CHOLECYSTECTOMY N/A 10/25/2018   Procedure: LAPAROSCOPIC CHOLECYSTECTOMY WITH INTRAOPERATIVE CHOLANGIOGRAM;  Surgeon: Kieth Brightly Arta Bruce, MD;  Location: [facility];  Service: General;  Laterality: N/A;   ERCP N/A 10/24/2018   Procedure: ENDOSCOPIC RETROGRADE  CHOLANGIOPANCREATOGRAPHY (ERCP);  Surgeon: Jackquline Denmark, MD;  Location: [facility];  Service: Endoscopy;  Laterality: N/A;   mediastinal removal of a goiter     MRI  08/18/07   head (diabetes heart study at [facility])   [place]  10/24/2018   Procedure: REMOVAL OF STONES;  Surgeon: Jackquline Denmark, MD;  Location: [facility];  Service: Endoscopy;;   SPHINCTEROTOMY  10/24/2018   Procedure: Joan Mayans;  Surgeon: Jackquline Denmark, MD;  Location: [facility];  Service: Endoscopy;;   TEE WITHOUT CARDIOVERSION N/A 11/27/2015   Procedure: TRANSESOPHAGEAL ECHOCARDIOGRAM (TEE);  Surgeon: [name], MD;  Location: [facility];  Service: Open Heart Surgery;  Laterality: N/A;   TRANSCATHETER AORTIC VALVE REPLACEMENT, TRANSFEMORAL N/A 11/27/2015   Procedure: TRANSCATHETER AORTIC VALVE REPLACEMENT, TRANSFEMORAL;  Surgeon: [name], MD;  Location: [facility];  Service: Open Heart Surgery;  Laterality: N/A;    ALLERGIES:  is allergic to amoxicillin and celebrex [celecoxib].  MEDICATIONS:  Current Facility-Administered Medications  Medication Dose Route Frequency Provider Last Rate Last Dose   0.9 %  sodium chloride infusion  250 mL Intravenous PRN Lady Deutscher, MD       acetaminophen (TYLENOL) tablet 650 mg  650 mg Oral Q6H PRN Lady Deutscher, MD       Or   acetaminophen (TYLENOL) suppository 650 mg  650 mg Rectal Q6H PRN Lady Deutscher, MD       [START ON 11/11/2018] atorvastatin (LIPITOR) tablet 10 mg  10 mg Oral Daily Patrecia Pour, MD       furosemide (LASIX) injection 40 mg  40 mg Intravenous BID Lady Deutscher, MD   40 mg at 11/10/18 0915   methimazole (TAPAZOLE) tablet 7.5 mg  7.5 mg Oral Daily Patrecia Pour, MD       polyethylene glycol (MIRALAX / GLYCOLAX) packet 17 g  17 g Oral Daily PRN Lady Deutscher, MD       sodium chloride flush (NS) 0.9 % injection 3 mL  3 mL Intravenous Q12H Lady Deutscher, MD   3 mL at 11/10/18 0919   sodium chloride flush (NS)  0.9 % injection 3 mL  3 mL Intravenous Q12H Lady Deutscher, MD   3 mL at 11/10/18 0918   sodium chloride flush (NS) 0.9 % injection 3 mL  3 mL Intravenous PRN Lady Deutscher, MD       traMADol Veatrice Bourbon) tablet 50 mg  50 mg Oral Q6H PRN Lady Deutscher, MD       traZODone (DESYREL) tablet 25 mg  25 mg Oral QHS PRN Lady Deutscher, MD        VITAL SIGNS: BP (!) 150/72 (BP Location: Left Arm)    Pulse 80  Temp 99 F (37.2 C) (Oral)    Resp 20    SpO2 99%  There were no vitals filed for this visit.  Estimated body mass index is 26.29 kg/m as calculated from the following:   Height as of 10/23/18: '5\' 2"'$  (1.575 m).   Weight as of 10/26/18: 65.2 kg.  LABS: CBC:    Component Value Date/Time   WBC 7.4 11/10/2018 0456   HGB 9.5 (L) 11/10/2018 1130   HGB 12.4 06/11/2018 1500   HCT 29.1 (L) 11/10/2018 1130   HCT 37.1 06/11/2018 1500   PLT 450 (H) 11/10/2018 0456   PLT 248 06/11/2018 1500   Comprehensive Metabolic Panel:    Component Value Date/Time   NA 136 11/10/2018 0456   NA 142 07/23/2018 1512   K 3.8 11/10/2018 0456   CO2 24 11/10/2018 0456   BUN 18 11/10/2018 0456   BUN 24 07/23/2018 1512   CREATININE 1.42 (H) 11/10/2018 0456   CREATININE 1.14 (H) 12/24/2015 1311   ALBUMIN 3.2 (L) 11/09/2018 1315   ALBUMIN 4.0 06/11/2018 1500     Review of Systems Unless otherwise noted, a complete review of systems is negative.  Physical Exam Vitals signs and nursing note reviewed.  Constitutional:      General: She is awake.     Comments: Chronically ill appearing, thin   Cardiovascular:     Rate and Rhythm: Normal rate and regular rhythm.     Pulses: Normal pulses.     Heart sounds: Normal heart sounds.  Pulmonary:     Effort: Pulmonary effort is normal.     Breath sounds: Decreased breath sounds present.     Comments: Room Air  Abdominal:     General: Abdomen is flat. Bowel sounds are normal.     Palpations: Abdomen is soft.  Musculoskeletal:     Right  lower leg: 1+ Edema present.  Skin:    General: Skin is warm and dry.     Findings: Bruising present.  Neurological:     Mental Status: She is alert and oriented to person, place, and time.     Motor: Weakness present.  Psychiatric:        Attention and Perception: Attention normal.        Mood and Affect: Mood normal.        Speech: Speech normal.        Behavior: Behavior is cooperative.        Thought Content: Thought content normal.        Cognition and Memory: Cognition normal.        Judgment: Judgment normal.   Prognosis: Unable to determine-Guarded in the setting of advanced age, acute on chronicCHF, lower extremity edema, bradycardia, a-fib,stage III CKD, hyperthyroidism,  generalized weakness, anemia, interstitial lung disease, and GERD,   Discharge Planning:  Home with Palliative Services and home health services    PLAN:  Full Code-as requested by patient/son  Continue to treat  Patient/Son expressed wishes to return home with home health and Palliative support   Outpatient Palliative at discharge  PMT will continue to follow and support        Palliative Performance Scale: 50%              Patient and son expressed understanding and was in agreement with this plan.    Thank you for allowing the Palliative Medicine Team to assist in the care of this patient.   Time In: 1300 Time Out: 1415 Total Time 75  min Prolonged Time Billed  NO      Visit consisted of counseling and education dealing with the complex and emotionally intense issues of symptom management and palliative care in the setting of serious and potentially life-threatening illness.Greater than 50%  of this time was spent counseling and coordinating care related to the above assessment and plan.  Signed by:  Alda Lea, AGPCNP-BC Palliative Medicine Team  Phone: [phone] Fax: [phone] Pager: [phone] Amion: Bjorn Pippin

## 2018-11-10 NOTE — Progress Notes (Signed)
PROGRESS NOTE  Alexa Mcdonald  BUL:[id number] DOB: 1921-04-03 DOA: 11/09/2018 PCP: Jonathon Jordan, MD   Brief Narrative:  Alexa Mcdonald is a 85 y.o. female with a history of atrial fibrillation, bradycardia, LBBB, nonobstructive CAD, stage III CKD, chronic HFpEF, aortic stenosis s/p TAVR, diverticulosis who presented to the ED 4/7 from surgeon's office for lower extremity edema and elevated HR. She had been admitted 3/20-3/24, underwent laroscopic cholecystectomy and ERCP with sphincterotomy for choledocholithiasis recently and restarted edoxaban at discharge home on 10/26/18. She reported no significant symptoms, but confirmed increasing leg swelling. In the ED, she was pale with pitting edema of the bilateral lower extremities, with hemoccult positive dark stool, Hgb was 7.6g/dl from 11.8 at recent discharge. CMP, CXR, ECG non acute. GI was consulted. RBC transfusion was ordered but not pursued due to limited supply and noncritical indication.   Assessment & Plan: Principal Problem:   Acute blood loss anemia Active Problems:   Bradycardia   Essential hypertension   Edema of both ankles   GERD (gastroesophageal reflux disease)   Long term current use of anticoagulant therapy   Interstitial lung disease (HCC)   Atrial fibrillation, persistent   S/P TAVR (transcatheter aortic valve replacement)   Acute on chronic diastolic CHF (congestive heart failure) (HCC)   Acute lower GI bleeding   Symptomatic anemia  Symptomatic acute blood loss anemia due to GI bleeding: hgb improved appropriately s/p 1u PRBCs 4/8. Sources possible sphincterotomy during recent ERCP, diverticulosis - Monitor serial H/H, if stable, check CBC in AM - Holding anticoagulation. - Make NPO pending GI reevaluation today. Plan per notes was for endoscopy. Pt not interested in colonoscopy. CT abd/pelvis discussed, ?if this would be helpful without contrast. - PPI empirically  Acute on chronic HFpEF: Last echocardiogram  in April 2019 showed an ejection fraction of 60 to 65% with grade 1 diastolic dysfunction. - Continue lasix 40mg  IV BID - Monitor strict I/O, daily weights.  - Continue telemetry  Bradycardia with persistent atrial fibrillation: Rate controlled.  - Stop anticoagulation, may be prudent to permanently discontinue this.   Stage III CKD:  - Avoid contrast if able.   HTN:  - Hold ACE  Interstitial lung disease: Noted continue home management.  Hyperthyroidism:  - Continue methimazole  HLD:  - Continue statin  Status post transcatheter aortic valve replacement: Noted.  DVT prophylaxis: SCDs Code Status: Full Family Communication: None at bedside Disposition Plan: Uncertain  Consultants:   [facility]  Procedures:   None  Antimicrobials:  None   Subjective: Denies dyspnea, chest pain, palpitations, dizziness. Slept well, no bleeding noted.  Objective: Vitals:   11/09/18 2258 11/09/18 2333 11/10/18 0215 11/10/18 0501  BP: (!) 147/78 (!) 151/70 (!) 150/64 (!) 155/70  Pulse: 78 63 66 78  Resp: 16 18 16    Temp: 98.2 F (36.8 C) 98.5 F (36.9 C) 98.4 F (36.9 C) 99.1 F (37.3 C)  TempSrc: Oral Oral Oral Oral  SpO2: 98% 100% 99% 99%    Intake/Output Summary (Last 24 hours) at 11/10/2018 1211 Last data filed at 11/10/2018 0300 Gross per 24 hour  Intake 620 ml  Output -  Net 620 ml   There were no vitals filed for this visit.  Gen: Elderly female in no distress Pulm: Non-labored breathing. Clear to auscultation bilaterally.  CV: Regular rate and rhythm. No murmur, rub, or gallop. No JVD, 1+ pitting pedal edema. GI: Abdomen soft, non-tender, non-distended, with normoactive bowel sounds. No organomegaly or masses felt. Ext:  Warm, no deformities Skin: Diffuse ecchymoses Neuro: Alert and oriented. HOH. No focal neurological deficits. Psych: Judgement and insight appear normal. Mood & affect appropriate.   Data Reviewed: I have personally reviewed following  labs and imaging studies  CBC: Recent Labs  Lab 11/09/18 1315 11/09/18 1920 11/10/18 0456 11/10/18 1130  WBC 9.4  --  7.4  --   NEUTROABS 8.0*  --   --   --   HGB 7.6* 7.6* 8.2* 9.5*  HCT 24.8* 24.3* 25.7* 29.1*  MCV 95.0  --  90.2  --   PLT 568*  --  450*  --    Basic Metabolic Panel: Recent Labs  Lab 11/09/18 1315 11/10/18 0456  NA 136 136  K 4.2 3.8  CL 103 101  CO2 24 24  GLUCOSE 147* 99  BUN 18 18  CREATININE 1.28* 1.42*  CALCIUM 8.3* 8.1*   GFR: CrCl cannot be calculated (Unknown ideal weight.). Liver Function Tests: Recent Labs  Lab 11/09/18 1315  AST 17  ALT 19  ALKPHOS 84  BILITOT 1.1  PROT 6.2*  ALBUMIN 3.2*   No results for input(s): LIPASE, AMYLASE in the last 168 hours. No results for input(s): AMMONIA in the last 168 hours. Coagulation Profile: No results for input(s): INR, PROTIME in the last 168 hours. Cardiac Enzymes: No results for input(s): CKTOTAL, CKMB, CKMBINDEX, TROPONINI in the last 168 hours. BNP (last 3 results) No results for input(s): PROBNP in the last 8760 hours. HbA1C: No results for input(s): HGBA1C in the last 72 hours. CBG: No results for input(s): GLUCAP in the last 168 hours. Lipid Profile: No results for input(s): CHOL, HDL, LDLCALC, TRIG, CHOLHDL, LDLDIRECT in the last 72 hours. Thyroid Function Tests: No results for input(s): TSH, T4TOTAL, FREET4, T3FREE, THYROIDAB in the last 72 hours. Anemia Panel: Recent Labs    11/10/18 0456  FERRITIN 19  TIBC 318  IRON 35   Urine analysis:    Component Value Date/Time   COLORURINE YELLOW 10/22/2018 2341   APPEARANCEUR HAZY (A) 10/22/2018 2341   LABSPEC 1.017 10/22/2018 2341   PHURINE 5.0 10/22/2018 2341   GLUCOSEU 50 (A) 10/22/2018 2341   HGBUR MODERATE (A) 10/22/2018 2341   BILIRUBINUR NEGATIVE 10/22/2018 2341   KETONESUR 20 (A) 10/22/2018 2341   PROTEINUR 30 (A) 10/22/2018 2341   NITRITE NEGATIVE 10/22/2018 2341   LEUKOCYTESUR NEGATIVE 10/22/2018 2341   No  results found for this or any previous visit (from the past 240 hour(s)).    Radiology Studies: Dg Chest 2 View  Result Date: 11/09/2018 CLINICAL DATA:  Pt stated she didn't know why she was here and has no current or previous chest complaints, HTN, diabetic, nonsmoker EXAM: CHEST - 2 VIEW COMPARISON:  11/28/2015 FINDINGS: Cardiac silhouette is normal in size. There are stable changes from endovascular aortic valve replacement. No mediastinal or hilar masses. There is no evidence of adenopathy. There are prominent bronchovascular and interstitial lung markings similar to prior exams. No lung consolidation. No pleural effusion or pneumothorax. Skeletal structures are demineralized but grossly intact. IMPRESSION: No acute cardiopulmonary disease. Electronically Signed   By: Lajean Manes M.D.   On: 11/09/2018 13:59    Scheduled Meds: . furosemide  40 mg Intravenous BID  . sodium chloride flush  3 mL Intravenous Q12H  . sodium chloride flush  3 mL Intravenous Q12H   Continuous Infusions: . sodium chloride       LOS: 1 day   Time spent: 25 minutes.  Meredith Leeds  Bonner Puna, MD [facility] [email or web address] Password [facility] 11/10/2018, 12:11 PM

## 2018-11-11 ENCOUNTER — Encounter (HOSPITAL_COMMUNITY)
Admission: EM | Disposition: A | Discharge status: Home Health | Payer: Self-pay | Source: Home / Self Care | Attending: Internal Medicine

## 2018-11-11 ENCOUNTER — Inpatient Hospital Stay (HOSPITAL_COMMUNITY): Payer: Medicare Other | Admitting: Anesthesiology

## 2018-11-11 ENCOUNTER — Encounter (HOSPITAL_COMMUNITY): Payer: Self-pay | Admitting: Certified Registered Nurse Anesthetist

## 2018-11-11 ENCOUNTER — Other Ambulatory Visit: Payer: Self-pay

## 2018-11-11 DIAGNOSIS — K269 Duodenal ulcer, unspecified as acute or chronic, without hemorrhage or perforation: Secondary | ICD-10-CM

## 2018-11-11 DIAGNOSIS — Z7901 Long term (current) use of anticoagulants: Secondary | ICD-10-CM

## 2018-11-11 DIAGNOSIS — Z952 Presence of prosthetic heart valve: Secondary | ICD-10-CM

## 2018-11-11 DIAGNOSIS — K2970 Gastritis, unspecified, without bleeding: Secondary | ICD-10-CM

## 2018-11-11 DIAGNOSIS — K3189 Other diseases of stomach and duodenum: Secondary | ICD-10-CM

## 2018-11-11 DIAGNOSIS — J849 Interstitial pulmonary disease, unspecified: Secondary | ICD-10-CM

## 2018-11-11 HISTORY — PX: ESOPHAGOGASTRODUODENOSCOPY (EGD) WITH PROPOFOL: SHX5813

## 2018-11-11 HISTORY — PX: BIOPSY: SHX5522

## 2018-11-11 LAB — BASIC METABOLIC PANEL
Anion gap: 13 (ref 5–15)
BUN: 17 mg/dL (ref 8–23)
CO2: 26 mmol/L (ref 22–32)
Calcium: 8.1 mg/dL — ABNORMAL LOW (ref 8.9–10.3)
Chloride: 101 mmol/L (ref 98–111)
Creatinine, Ser: 1.66 mg/dL — ABNORMAL HIGH (ref 0.44–1.00)
GFR calc Af Amer: 30 mL/min — ABNORMAL LOW (ref >60)
GFR calc non Af Amer: 26 mL/min — ABNORMAL LOW (ref >60)
Glucose, Bld: 76 mg/dL (ref 70–99)
Potassium: 3.3 mmol/L — ABNORMAL LOW (ref 3.5–5.1)
Sodium: 140 mmol/L (ref 135–145)

## 2018-11-11 LAB — CBC
HCT: 25.9 % — ABNORMAL LOW (ref 36.0–46.0)
Hemoglobin: 8.3 g/dL — ABNORMAL LOW (ref 12.0–15.0)
MCH: 28.6 pg (ref 26.0–34.0)
MCHC: 32 g/dL (ref 30.0–36.0)
MCV: 89.3 fL (ref 80.0–100.0)
Platelets: 447 10*3/uL — ABNORMAL HIGH (ref 150–400)
RBC: 2.9 MIL/uL — ABNORMAL LOW (ref 3.87–5.11)
RDW: 17 % — ABNORMAL HIGH (ref 11.5–15.5)
WBC: 7.9 10*3/uL (ref 4.0–10.5)
nRBC: 0 % (ref 0.0–0.2)

## 2018-11-11 SURGERY — ESOPHAGOGASTRODUODENOSCOPY (EGD) WITH PROPOFOL
Anesthesia: General

## 2018-11-11 MED ORDER — PHENYLEPHRINE HCL 10 MG/ML IJ SOLN
INTRAMUSCULAR | Status: DC | PRN
  Administered 2018-11-11: 160 ug via INTRAVENOUS

## 2018-11-11 MED ORDER — DEXAMETHASONE SODIUM PHOSPHATE 10 MG/ML IJ SOLN
INTRAMUSCULAR | Status: DC | PRN
  Administered 2018-11-11: 5 mg via INTRAVENOUS

## 2018-11-11 MED ORDER — SUCCINYLCHOLINE CHLORIDE 20 MG/ML IJ SOLN
INTRAMUSCULAR | Status: DC | PRN
  Administered 2018-11-11: 120 mg via INTRAVENOUS

## 2018-11-11 MED ORDER — ONDANSETRON HCL 4 MG/2ML IJ SOLN
INTRAMUSCULAR | Status: DC | PRN
  Administered 2018-11-11: 4 mg via INTRAVENOUS

## 2018-11-11 MED ORDER — INDOMETHACIN 50 MG RE SUPP
RECTAL | Status: AC
  Filled 2018-11-11: qty 1

## 2018-11-11 MED ORDER — SODIUM CHLORIDE 0.9 % IV SOLN
INTRAVENOUS | Status: DC
  Administered 2018-11-11: 09:00:00 via INTRAVENOUS

## 2018-11-11 MED ORDER — PROPOFOL 10 MG/ML IV BOLUS
INTRAVENOUS | Status: DC | PRN
  Administered 2018-11-11: 80 mg via INTRAVENOUS

## 2018-11-11 MED ORDER — GLUCAGON HCL RDNA (DIAGNOSTIC) 1 MG IJ SOLR
INTRAMUSCULAR | Status: AC
  Filled 2018-11-11: qty 1

## 2018-11-11 MED ORDER — CIPROFLOXACIN IN D5W 400 MG/200ML IV SOLN
INTRAVENOUS | Status: AC
  Filled 2018-11-11: qty 200

## 2018-11-11 MED ORDER — LIDOCAINE HCL (CARDIAC) PF 100 MG/5ML IV SOSY
PREFILLED_SYRINGE | INTRAVENOUS | Status: DC | PRN
  Administered 2018-11-11: 100 mg via INTRAVENOUS

## 2018-11-11 MED ORDER — SODIUM CHLORIDE 0.9 % IV SOLN
510.0000 mg | Freq: Once | INTRAVENOUS | Status: DC
  Filled 2018-11-11: qty 17

## 2018-11-11 SURGICAL SUPPLY — 15 items

## 2018-11-11 NOTE — Interval H&P Note (Signed)
History and Physical Interval Note:  11/11/2018 9:01 AM  Alexa Mcdonald  has presented today for surgery, with the diagnosis of Acute anemia.  FOBT positive.  Sphincterotomy 2 weeks ago.  The various methods of treatment have been discussed with the patient and family. After consideration of risks, benefits and other options for treatment, the patient has consented to  Procedure(s): ESOPHAGOGASTRODUODENOSCOPY (EGD) WITH PROPOFOL (N/A) as a surgical intervention.  The patient's history has been reviewed, patient examined, no change in status, stable for surgery.  I have reviewed the patient's chart and labs.  Questions were answered to the patient's satisfaction.    The risks of an ERCP were discussed at length, including but not limited to the risk of perforation, bleeding, abdominal pain, post-ERCP pancreatitis (while usually mild can be severe and even life threatening).    [employer]

## 2018-11-11 NOTE — Anesthesia Postprocedure Evaluation (Signed)
Anesthesia Post Note  Patient: Alexa Mcdonald  Procedure(s) Performed: ESOPHAGOGASTRODUODENOSCOPY (EGD) WITH PROPOFOL (N/A ) BIOPSY     Patient location during evaluation: PACU Anesthesia Type: General Level of consciousness: awake and alert Pain management: pain level controlled Vital Signs Assessment: post-procedure vital signs reviewed and stable Respiratory status: spontaneous breathing, nonlabored ventilation, respiratory function stable and patient connected to nasal cannula oxygen Cardiovascular status: blood pressure returned to baseline and stable Postop Assessment: no apparent nausea or vomiting Anesthetic complications: no    Last Vitals:  Vitals:   11/11/18 1030 11/11/18 1050  BP: (!) 158/53 (!) 153/64  Pulse: 64 60  Resp: 15 16  Temp:  36.5 C  SpO2: 99% 98%    Last Pain:  Vitals:   11/11/18 1050  TempSrc: Oral  PainSc:                  Jora Galluzzo

## 2018-11-11 NOTE — Progress Notes (Signed)
Patient ID: Alexa Mcdonald, female   DOB: Aug 13, 1920, 85 y.o.   MRN: [id number]  PROGRESS NOTE    Alexa Mcdonald  RWE:[id number] DOB: 1920-11-29 DOA: 11/09/2018 PCP: Jonathon Jordan, MD   Brief Narrative:  85 year old female with history of atrial fibrillation, bradycardia, LBBB, nonobstructive CAD, stage III CKD, chronic diastolic heart failure, aortic stenosis status post TAVR, diverticulosis, recent admission from 10/22/2018-10/26/2018 during which she underwent laparoscopic cholecystectomy and ERCP with sphincterotomy for choledocholithiasis and restarted on edoxaban at discharge home on 10/26/2018 was sent from surgeon's office for lower extremity edema and elevated heart rate.  She was found to have hemoglobin of 7.6 on presentation; hemoglobin was 11.8 on recent discharge.  GI was consulted.  Assessment & Plan:   Principal Problem:   Acute blood loss anemia Active Problems:   Bradycardia   Essential hypertension   Edema of both ankles   GERD (gastroesophageal reflux disease)   Long term current use of anticoagulant therapy   Interstitial lung disease (HCC)   Atrial fibrillation, persistent   S/P TAVR (transcatheter aortic valve replacement)   Acute on chronic diastolic CHF (congestive heart failure) (HCC)   Acute lower GI bleeding   Symptomatic anemia  Symptomatic acute blood loss anemia due to GI bleeding -Might be secondary to patient sphincterotomy during recent ERCP versus diverticulosis  -status post 1 unit packed red cell transfusion on 11/10/2018.  Hemoglobin is 8.3 today. -Anticoagulation on hold -For EGD today.  Currently n.p.o.  Follow further GI recommendations  Acute on chronic diastolic heart failure -Last echo in April 2019 showed EF of 60 to 65% with grade 1 diastolic dysfunction -Continue Lasix.  Strict input output.  Daily weights.  Bradycardia with persistent atrial fibrillation -Heart rate stable.  Anticoagulation on hold.  Chronic kidney disease stage  III -Creatinine stable.  Monitor  Hypertension--ACE inhibitor on hold.  Blood pressure stable  Interstitial lung disease next-stable.  Outpatient follow-up  Hypothyroidism-continue methimazole  Hyperlipidemia--continue statin  Status post transcatheter aortic valve replacement  Generalized deconditioning -PT/OT eval.  Palliative care following.  Currently still full code.    DVT prophylaxis: SCDs Code Status: Full Family Communication: None at bedside Disposition Plan: Home versus rehab depending on PT evaluation.  Consultants: GI  Procedures: None  Antimicrobials: None   Subjective: Patient seen and examined at bedside.  She is sleepy, wakes up only very slightly, hardly answers any questions.  No overnight fever or vomiting.  Objective: Vitals:   11/11/18 0500 11/11/18 0540 11/11/18 0605 11/11/18 0835  BP:  (!) 156/67  (!) 164/63  Pulse:  70  74  Resp:  18  (!) 22  Temp:  98.7 F (37.1 C)  98.6 F (37 C)  TempSrc:  Oral  Oral  SpO2:  97%  97%  Weight: 59.7 kg     Height:   5\' 2"  (1.575 m)     Intake/Output Summary (Last 24 hours) at 11/11/2018 0911 Last data filed at 11/11/2018 0320 Gross per 24 hour  Intake 3 ml  Output 800 ml  Net -797 ml   Filed Weights   11/11/18 0500  Weight: 59.7 kg    Examination:  General exam: Appears calm and comfortable.  Elderly female, sleepy, wakes up only very slightly.  Hardly answers any questions Respiratory system: Bilateral decreased breath sounds at bases, scattered crackles Cardiovascular system: S1 & S2 heard, Rate controlled Gastrointestinal system: Abdomen is nondistended, soft and nontender. Normal bowel sounds heard. Extremities: No cyanosis, clubbing; trace edema  Data  Reviewed: I have personally reviewed following labs and imaging studies  CBC: Recent Labs  Lab 11/09/18 1315 11/09/18 1920 11/10/18 0456 11/10/18 1130 11/11/18 0220  WBC 9.4  --  7.4  --  7.9  NEUTROABS 8.0*  --   --   --   --    HGB 7.6* 7.6* 8.2* 9.5* 8.3*  HCT 24.8* 24.3* 25.7* 29.1* 25.9*  MCV 95.0  --  90.2  --  89.3  PLT 568*  --  450*  --  462*   Basic Metabolic Panel: Recent Labs  Lab 11/09/18 1315 11/10/18 0456 11/11/18 0220  NA 136 136 140  K 4.2 3.8 3.3*  CL 103 101 101  CO2 24 24 26   GLUCOSE 147* 99 76  BUN 18 18 17   CREATININE 1.28* 1.42* 1.66*  CALCIUM 8.3* 8.1* 8.1*   GFR: Estimated Creatinine Clearance: 16 mL/min (A) (by C-G formula based on SCr of 1.66 mg/dL (H)). Liver Function Tests: Recent Labs  Lab 11/09/18 1315  AST 17  ALT 19  ALKPHOS 84  BILITOT 1.1  PROT 6.2*  ALBUMIN 3.2*   No results for input(s): LIPASE, AMYLASE in the last 168 hours. No results for input(s): AMMONIA in the last 168 hours. Coagulation Profile: No results for input(s): INR, PROTIME in the last 168 hours. Cardiac Enzymes: No results for input(s): CKTOTAL, CKMB, CKMBINDEX, TROPONINI in the last 168 hours. BNP (last 3 results) No results for input(s): PROBNP in the last 8760 hours. HbA1C: No results for input(s): HGBA1C in the last 72 hours. CBG: No results for input(s): GLUCAP in the last 168 hours. Lipid Profile: No results for input(s): CHOL, HDL, LDLCALC, TRIG, CHOLHDL, LDLDIRECT in the last 72 hours. Thyroid Function Tests: No results for input(s): TSH, T4TOTAL, FREET4, T3FREE, THYROIDAB in the last 72 hours. Anemia Panel: Recent Labs    11/10/18 0456  FERRITIN 19  TIBC 318  IRON 35   Sepsis Labs: No results for input(s): PROCALCITON, LATICACIDVEN in the last 168 hours.  No results found for this or any previous visit (from the past 240 hour(s)).       Radiology Studies: Dg Chest 2 View  Result Date: 11/09/2018 CLINICAL DATA:  Pt stated she didn't know why she was here and has no current or previous chest complaints, HTN, diabetic, nonsmoker EXAM: CHEST - 2 VIEW COMPARISON:  11/28/2015 FINDINGS: Cardiac silhouette is normal in size. There are stable changes from endovascular  aortic valve replacement. No mediastinal or hilar masses. There is no evidence of adenopathy. There are prominent bronchovascular and interstitial lung markings similar to prior exams. No lung consolidation. No pleural effusion or pneumothorax. Skeletal structures are demineralized but grossly intact. IMPRESSION: No acute cardiopulmonary disease. Electronically Signed   By: Lajean Manes M.D.   On: 11/09/2018 13:59        Scheduled Meds: . [MAR Hold] atorvastatin  10 mg Oral Daily  . [MAR Hold] furosemide  40 mg Intravenous BID  . [MAR Hold] methimazole  7.5 mg Oral QHS  . [MAR Hold] sodium chloride flush  3 mL Intravenous Q12H  . [MAR Hold] sodium chloride flush  3 mL Intravenous Q12H   Continuous Infusions: . [MAR Hold] sodium chloride    . sodium chloride 20 mL/hr at 11/11/18 0840  . [MAR Hold] ferumoxytol       LOS: 2 days        Aline August, MD [facility] 11/11/2018, 9:11 AM

## 2018-11-11 NOTE — Anesthesia Procedure Notes (Signed)
Procedure Name: Intubation Date/Time: 11/11/2018 9:18 AM Performed by: Kaytlan Behrman T, CRNA Pre-anesthesia Checklist: Patient identified, Emergency Drugs available, Suction available and Patient being monitored Patient Re-evaluated:Patient Re-evaluated prior to induction Oxygen Delivery Method: Circle system utilized Preoxygenation: Pre-oxygenation with 100% oxygen Induction Type: IV induction and Rapid sequence Laryngoscope Size: Miller and 2 Grade View: Grade I Tube type: Oral Tube size: 7.5 mm Number of attempts: 1 Airway Equipment and Method: Patient positioned with wedge pillow and Stylet Placement Confirmation: ETT inserted through vocal cords under direct vision,  positive ETCO2 and breath sounds checked- equal and bilateral Secured at: 20 cm Tube secured with: Tape Dental Injury: Teeth and Oropharynx as per pre-operative assessment

## 2018-11-11 NOTE — Anesthesia Preprocedure Evaluation (Addendum)
Anesthesia Evaluation  Patient identified by MRN, date of birth, ID band Patient awake    Reviewed: Allergy & Precautions, H&P , NPO status , Patient's Chart, lab work & pertinent test results  Airway Mallampati: II  TM Distance: >3 FB     Dental   Pulmonary pneumonia,    breath sounds clear to auscultation       Cardiovascular hypertension, Pt. on medications + CAD  + dysrhythmias Atrial Fibrillation  Rhythm:Regular Rate:Normal  Echo 18  Left ventricle: The cavity size was normal. There was mild   concentric hypertrophy. Systolic function was normal. The   estimated ejection fraction was in the range of 60% to 65%. Wall   motion was normal; there were no regional wall motion   abnormalities. Doppler parameters are consistent with abnormal   left ventricular relaxation (grade 1 diastolic dysfunction).   Doppler parameters are consistent with high ventricular filling   pressure. - Aortic valve: A TAVR bioprosthesis was present and functioning   properly. There was mild regurgitation. Peak velocity (S): 302   cm/s. Mean gradient (S): 17 mm Hg. - Mitral valve: Calcified annulus. Transvalvular velocity was   within the normal range. There was no evidence for stenosis.   There was mild regurgitation. - Left atrium: The atrium was severely dilated. - Right ventricle: The cavity size was normal. Wall thickness was   normal. Systolic function was normal.   Neuro/Psych    GI/Hepatic GERD  ,  Endo/Other  diabetesHyperthyroidism   Renal/GU Renal disease     Musculoskeletal   Abdominal   Peds  Hematology   Anesthesia Other Findings   Reproductive/Obstetrics                            Anesthesia Physical  Anesthesia Plan  ASA: IV and emergent  Anesthesia Plan: General   Post-op Pain Management:    Induction: Intravenous  PONV Risk Score and Plan: 3 and Treatment may vary due to age or  medical condition and TIVA  Airway Management Planned: Oral ETT  Additional Equipment:   Intra-op Plan:   Post-operative Plan: Extubation in OR  Informed Consent: I have reviewed the patients History and Physical, chart, labs and discussed the procedure including the risks, benefits and alternatives for the proposed anesthesia with the patient or authorized representative who has indicated his/her understanding and acceptance.     Dental advisory given  Plan Discussed with: CRNA, Anesthesiologist and Surgeon  Anesthesia Plan Comments:        Anesthesia Quick Evaluation

## 2018-11-11 NOTE — Op Note (Signed)
[facility] Patient Name: Alexa Mcdonald Procedure Date : 11/11/2018 MRN: [id number] Attending MD: Justice Britain , MD Date of Birth: [birth year] CSN: [id number] Age: 85 Admit Type: Inpatient Procedure:                Upper GI endoscopy Indications:              Iron deficiency anemia, Need for anticoagulation Providers:                Justice Britain, MD, Baird Cancer, RN, Grace Isaac, RN, William Dalton, Technician Referring MD:             Dr. Jen Mow ([facility]), Jackquline Denmark, MD Medicines:                General Anesthesia Complications:            No immediate complications. Estimated Blood Loss:     Estimated blood loss was minimal. Procedure:                Pre-Anesthesia Assessment:                           - Prior to the procedure, a History and Physical                            was performed, and patient medications and                            allergies were reviewed. The patient's tolerance of                            previous anesthesia was also reviewed. The risks                            and benefits of the procedure and the sedation                            options and risks were discussed with the patient.                            All questions were answered, and informed consent                            was obtained. Prior Anticoagulants: The patient has                            taken anticoagulant medication, last dose was 3                            days prior to procedure. ASA Grade Assessment: III                            - A patient with severe systemic disease. After  reviewing the risks and benefits, the patient was                            deemed in satisfactory condition to undergo the                            procedure.                           After obtaining informed consent, the endoscope was                            passed under direct vision. Throughout  the                            procedure, the patient's blood pressure, pulse, and                            oxygen saturations were monitored continuously. The                            [id number]-H190 ([id number]) Olympus gastroscope was                            introduced through the mouth, and advanced to the                            second part of duodenum. The [id number]-[id number] ([id number])                            [place] was introduced through the                            mouth, and advanced to the second part of duodenum.                            The upper GI endoscopy was accomplished without                            difficulty. The patient tolerated the procedure. Scope In: Scope Out: Findings:      No gross lesions were noted in the entire esophagus.      Multiple dispersed, small to medium sized non-bleeding erosions were       found in the gastric body, at the incisura, in the gastric antrum and in       the prepyloric region of the stomach. There were stigmata of recent       bleeding in setting of hematin being noted within the stomach - this all       lavaged off.      No other gross lesions were noted in the entire examined stomach.       Biopsies were taken with a cold forceps for histology and Helicobacter       pylori testing.      Six non-bleeding cratered duodenal ulcers with a clean ulcer base       (  Forrest Class III) were found in the duodenal bulb and in the D1/D2       sweep. The largest lesion was 10 mm in largest dimension.      There was evidence of a patent sphincterotomy at the ampulla. This was       characterized by very minimal ulceration but otherwise healthy tissue.      The second portion of the duodenum was normal. Impression:               - No gross lesions in esophagus.                           - Recently bleeding erosive gastropathy. Biopsied                            for HP.                           - Non-bleeding duodenal ulcers  with a clean ulcer                            base (Forrest Class III).                           - Patent sphincterotomy, characterized by healthy                            tissue and very minimal ulceration.                           - Normal second portion of the duodenum. Recommendation:           - The patient will be observed post-procedure,                            until all discharge criteria are met.                           - Return patient to hospital ward for ongoing care.                           - Observe patient's clinical course.                           - Patient should be maintained on 40 mg PPI BID for                            the next 2-3 months. Eventually would like to                            decrease that to 40 mg daily and then maintain at                            20-40 mg based on how she does thereafter in  setting of PUD with bleeding history.                           - Observe patient's clinical course.                           - Trend Hgb/Hct.                           - With all ulcers being clean based I think it is                            OK to consider restarting NOAC in 24-48 hours. In                            effort of monitoring patient while she is in house                            before discharge home with NOAC, consider                            restarting anticoagulation tomorrow.                           - Follow up with GI in 4-6 weeks and consider                            possible role of repeat EGD in 50-month. I think                            doing in hospital would be ideal if this is pursued                            since the side-viewing endoscope was most helpful                            in identification of the duodenal sweep ulcer that                            was largest today.                           - The findings and recommendations were discussed                             with the referring physician.                           - The findings and recommendations were discussed                            with the patient.                           -  The findings and recommendations were discussed                            with the patient's family. Procedure Code(s):        --- Professional ---                           [phone], Esophagogastroduodenoscopy, flexible,                            transoral; with biopsy, single or multiple Diagnosis Code(s):        --- Professional ---                           K31.89, Other diseases of stomach and duodenum                           K92.2, Gastrointestinal hemorrhage, unspecified                           K26.9, Duodenal ulcer, unspecified as acute or                            chronic, without hemorrhage or perforation                           Z98.890, Other specified postprocedural states                           D50.9, Iron deficiency anemia, unspecified CPT copyright 2019 American Medical Association. All rights reserved. The codes documented in this report are preliminary and upon coder review may  be revised to meet current compliance requirements. Justice Britain, MD 11/11/2018 12:09:07 PM Number of Addenda: 0

## 2018-11-11 NOTE — Transfer of Care (Signed)
Immediate Anesthesia Transfer of Care Note  Patient: Alexa Mcdonald  Procedure(s) Performed: ESOPHAGOGASTRODUODENOSCOPY (EGD) WITH PROPOFOL (N/A ) BIOPSY  Patient Location: Endoscopy Unit  Anesthesia Type:General  Level of Consciousness: awake and alert   Airway & Oxygen Therapy: Patient Spontanous Breathing and Patient connected to nasal cannula oxygen  Post-op Assessment: Report given to RN, Post -op Vital signs reviewed and stable and Patient moving all extremities  Post vital signs: Reviewed and stable  Last Vitals:  Vitals Value Taken Time  BP    Temp    Pulse 61 11/11/2018 10:05 AM  Resp 23 11/11/2018 10:05 AM  SpO2 100 % 11/11/2018 10:05 AM  Vitals shown include unvalidated device data.  Last Pain:  Vitals:   11/11/18 0835  TempSrc: Oral  PainSc: 0-No pain         Complications: No apparent anesthesia complications

## 2018-11-12 ENCOUNTER — Encounter (HOSPITAL_COMMUNITY): Payer: Self-pay | Admitting: Physician Assistant

## 2018-11-12 DIAGNOSIS — D500 Iron deficiency anemia secondary to blood loss (chronic): Secondary | ICD-10-CM

## 2018-11-12 DIAGNOSIS — K2960 Other gastritis without bleeding: Secondary | ICD-10-CM

## 2018-11-12 LAB — BASIC METABOLIC PANEL
Anion gap: 11 (ref 5–15)
BUN: 30 mg/dL — ABNORMAL HIGH (ref 8–23)
CO2: 26 mmol/L (ref 22–32)
Calcium: 7.7 mg/dL — ABNORMAL LOW (ref 8.9–10.3)
Chloride: 97 mmol/L — ABNORMAL LOW (ref 98–111)
Creatinine, Ser: 1.93 mg/dL — ABNORMAL HIGH (ref 0.44–1.00)
GFR calc Af Amer: 25 mL/min — ABNORMAL LOW (ref >60)
GFR calc non Af Amer: 22 mL/min — ABNORMAL LOW (ref >60)
Glucose, Bld: 248 mg/dL — ABNORMAL HIGH (ref 70–99)
Potassium: 3.8 mmol/L (ref 3.5–5.1)
Sodium: 134 mmol/L — ABNORMAL LOW (ref 135–145)

## 2018-11-12 LAB — CBC WITH DIFFERENTIAL/PLATELET
Abs Immature Granulocytes: 0.04 10*3/uL (ref 0.00–0.07)
Basophils Absolute: 0 10*3/uL (ref 0.0–0.1)
Basophils Relative: 0 %
Eosinophils Absolute: 0 10*3/uL (ref 0.0–0.5)
Eosinophils Relative: 0 %
HCT: 24.6 % — ABNORMAL LOW (ref 36.0–46.0)
Hemoglobin: 7.8 g/dL — ABNORMAL LOW (ref 12.0–15.0)
Immature Granulocytes: 1 %
Lymphocytes Relative: 5 %
Lymphs Abs: 0.4 10*3/uL — ABNORMAL LOW (ref 0.7–4.0)
MCH: 28.2 pg (ref 26.0–34.0)
MCHC: 31.7 g/dL (ref 30.0–36.0)
MCV: 88.8 fL (ref 80.0–100.0)
Monocytes Absolute: 0.8 10*3/uL (ref 0.1–1.0)
Monocytes Relative: 9 %
Neutro Abs: 7.2 10*3/uL (ref 1.7–7.7)
Neutrophils Relative %: 85 %
Platelets: 433 10*3/uL — ABNORMAL HIGH (ref 150–400)
RBC: 2.77 MIL/uL — ABNORMAL LOW (ref 3.87–5.11)
RDW: 16.6 % — ABNORMAL HIGH (ref 11.5–15.5)
WBC: 8.4 10*3/uL (ref 4.0–10.5)
nRBC: 0 % (ref 0.0–0.2)

## 2018-11-12 LAB — MAGNESIUM: Magnesium: 1.9 mg/dL (ref 1.7–2.4)

## 2018-11-12 MED ORDER — FERROUS SULFATE 325 (65 FE) MG PO TABS
325.0000 mg | ORAL_TABLET | Freq: Every morning | ORAL | Status: DC
  Administered 2018-11-12 – 2018-11-13 (×2): 325 mg via ORAL
  Filled 2018-11-12 (×2): qty 1

## 2018-11-12 MED ORDER — PANTOPRAZOLE SODIUM 40 MG PO TBEC
40.0000 mg | DELAYED_RELEASE_TABLET | Freq: Every day | ORAL | Status: DC
  Administered 2018-11-13: 40 mg via ORAL
  Filled 2018-11-12: qty 1

## 2018-11-12 NOTE — Care Management Important Message (Signed)
Important Message  Patient Details  Name: Alexa Mcdonald MRN: [id number] Date of Birth: [birth year]   Medicare Important Message Given:  Yes    Orbie Pyo 11/12/2018, 2:57 PM

## 2018-11-12 NOTE — Progress Notes (Signed)
ANTICOAGULATION CONSULT NOTE - Initial Consult  Pharmacy Consult for Afib Indication: atrial fibrillation  Allergies  Allergen Reactions  . Amoxicillin Other (See Comments)    tongue felt bruised Did it involve swelling of the face/tongue/throat, SOB, or low BP? No Did it involve sudden or severe rash/hives, skin peeling, or any reaction on the inside of your mouth or nose? No Did you need to seek medical attention at a hospital or doctor's office? No When did it last happen?childhood If all above answers are "NO", may proceed with cephalosporin use.    . Celebrex [Celecoxib] Other (See Comments)    Mouth sores    Patient Measurements: Height: 5\' 2"  (157.5 cm) Weight: 128 lb (58.1 kg) IBW/kg (Calculated) : 50.1  Vital Signs: Temp: 98.6 F (37 C) (04/10 0510) Temp Source: Oral (04/10 0510) BP: 126/49 (04/10 0510) Pulse Rate: 59 (04/10 0510)  Labs: Recent Labs    11/10/18 0456 11/10/18 1130 11/11/18 0220 11/12/18 0235  HGB 8.2* 9.5* 8.3* 7.8*  HCT 25.7* 29.1* 25.9* 24.6*  PLT 450*  --  447* 433*  CREATININE 1.42*  --  1.66* 1.93*    Estimated Creatinine Clearance: 13.8 mL/min (A) (by C-G formula based on SCr of 1.93 mg/dL (H)).   Medical History: Past Medical History:  Diagnosis Date  . 1st degree AV block   . Bradycardia   . Chronic diastolic heart failure ([place]) 02/07/2016  . CKD (chronic kidney disease), stage III ([place])   . Colon, diverticulosis   . Diabetes mellitus    diet controlled  . Esophageal reflux   . Essential hypertension   . GERD (gastroesophageal reflux disease)   . Hematochezia   . HOH (hard of hearing)    wears bilateral hearing aids  . Hx of orthostatic hypotension   . Interstitial lung disease ([place])   . LBBB (left bundle branch block)   . Long term current use of anticoagulant therapy   . Mediastinal goiter     recurrent s/p resection 1988  . Mild CAD   . Mitral regurgitation   . Osteopenia   . Osteoporosis   . Paroxysmal  atrial fibrillation (HCC)    a. CHA2DS2VASc = 5-->coumadin;  b. previously on amio->d/c'd 11/2014 secondary to concern for possible amio lung.  . Pneumonia   . 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 Severe aortic stenosis   . Stroke ([place])   . Tachycardia-bradycardia ([place])   . Vitamin D deficiency     Assessment: CC/HPI: LE edema and tachy with Hgb down to 7.6  PMH: fibrillation, bradycardia, LBBB, nonobstructive CAD, stage III CKD, chronic diastolic heart failure, aortic stenosis status post TAVR, diverticulosis, GERD, ILD,   Significant events: recent admission from 10/22/2018-10/26/2018 during which she underwent laparoscopic cholecystectomy and ERCP with sphincterotomy for choledocholithiasis   Anticoag: Afib on Edoxaban PTA. Admitted with Hgb down to 7.6-transfused 4/8. Status post EGD on 11/11/2018 which showed erosive gastropathy and nonbleeding duodenal ulcers. Hgb dropped to 7.8 today. Resume Edoxaban.  Goal of Therapy:  Therapeutic oral anticoagulation   Plan:  Edoxaban 30mg /d (reduced dose)  Alexa Stephen S. Alford Highland, PharmD, [facility] Clinical Staff Pharmacist Eilene Ghazi Stillinger 11/12/2018,10:34 AM

## 2018-11-12 NOTE — Progress Notes (Addendum)
Daily Rounding Note  11/12/2018, 1:17 PM  LOS: 3 days   SUBJECTIVE:   Chief complaint:  IDA   Pt feels well.  No complaints.  No BMs  OBJECTIVE:         Vital signs in last 24 hours:    Temp:  [97.7 F (36.5 C)-98.7 F (37.1 C)] 98.6 F (37 C) (04/10 0510) Pulse Rate:  [56-71] 59 (04/10 0510) Resp:  [16-18] 16 (04/10 0510) BP: (115-133)/(46-79) 126/49 (04/10 0510) SpO2:  [97 %-100 %] 100 % (04/10 0510) Weight:  [58.1 kg] 58.1 kg (04/10 0136) Last BM Date: 11/09/18 Filed Weights   11/11/18 0500 11/12/18 0136  Weight: 59.7 kg 58.1 kg   General: looks well, younger looking than 89.     Heart: afib in 50s on tele, valve murmer Chest: clear bil.   No dyspnea or cough Abdomen: soft, NT, ND.  Active BS  Extremities: no CCE Neuro/Psych:  Alert, appropriate, no tremors.    Intake/Output from previous day: 04/09 0701 - 04/10 0700 In: 403 [P.O.:400; I.V.:3] Out: 500 [Urine:500]  Intake/Output this shift: No intake/output data recorded.  Lab Results: Recent Labs    11/10/18 0456 11/10/18 1130 11/11/18 0220 11/12/18 0235  WBC 7.4  --  7.9 8.4  HGB 8.2* 9.5* 8.3* 7.8*  HCT 25.7* 29.1* 25.9* 24.6*  PLT 450*  --  447* 433*   BMET Recent Labs    11/10/18 0456 11/11/18 0220 11/12/18 0235  NA 136 140 134*  K 3.8 3.3* 3.8  CL 101 101 97*  CO2 24 26 26   GLUCOSE 99 76 248*  BUN 18 17 30*  CREATININE 1.42* 1.66* 1.93*  CALCIUM 8.1* 8.1* 7.7*     Scheduled Meds: . atorvastatin  10 mg Oral Daily  . methimazole  7.5 mg Oral QHS  . sodium chloride flush  3 mL Intravenous Q12H  . sodium chloride flush  3 mL Intravenous Q12H   Continuous Infusions: . sodium chloride    . ferumoxytol     PRN Meds:.sodium chloride, acetaminophen **OR** acetaminophen, polyethylene glycol, sodium chloride flush, traMADol, traZODone   ASSESMENT:   *   IDA.  FOBT +.  No tarry or melenic stools 11/11/18 EGD: recently bleeding  erosive gastropathy.  Patent sphincterotomy with minor ulceration.  .  Non-bleeding DUs.  Path: - GASTRIC ANTRAL MUCOSA WITH REACTIVE GASTROPATHY. GASTRIC OXYNTIC MUCOSA WITH MILD CHRONIC GASTRITIS.  NEGATIVE FOR HELICOBACTER PYLORI. No records of previous colonoscopies.    Hgb 7.6 >> 9.5 >> 8.3 >> 7.8.  1 U PRBC on 4/8. Was 11.8 on 10/26/18. Ferritin 19, iron 35   *   10/24/18 ERCP with sphinct, stone extraction.  10/25/18 Lap chole.    *   Chronic Edoxaban for A fib.  S/p TAVR.  Afib as office 3 d ago  *   CKD, AKI.      *  Hyperthyroidism??, on Tapazole.  Has not had check of TSH    PLAN   *   Check TSH with next labs.  CBC in AM.      Azucena Freed  11/12/2018, 1:17 PM Phone [phone]  GI ATTENDING  Case reviewed with Dr. Rush Landmark.  Endoscopic findings reviewed.  Pathology as outlined above.  Patient with acute blood loss anemia secondary to erosive gastritis and likely post sphincterotomy bleed.  No high risk lesions for recurrent bleeding at this time.  Okay for her to be on her  anticoagulation therapy from a GI perspective.  She does need daily PPI therapy indefinitely in the form of pantoprazole 40 mg daily.  If you need to transfuse here for clinical reasons, feel free.  She may benefit from once daily iron therapy short-term to help build up her blood counts.  She will not need chronic iron unless she were to develop both short and long-term.  Her primary provider may monitor her blood counts carefully after her hospital discharge.  No specific GI follow-up planned.  Please call for questions or problems.  We will sign off.  Docia Chuck. Geri Seminole., M.D. [facility] of Gastroenterology

## 2018-11-12 NOTE — Evaluation (Signed)
Occupational Therapy Evaluation Patient Details Name: Alexa Mcdonald MRN: [id number] DOB: 19-Dec-1920 Today's Date: 11/12/2018    History of Present Illness Pt is a 85 y/o female admitted secondary to bilateral LE edema and tachycardia. PMH including but not limited to atrial fibrillation, bradycardia, LBBB, nonobstructive CAD, stage III CKD, chronic diastolic heart failure, aortic stenosis status post TAVR, diverticulosis, recent admission from 10/22/2018-10/26/2018 during which she underwent laparoscopic cholecystectomy and ERCP with sphincterotomy for choledocholithiasis.   Clinical Impression   Pt PTA: living alone and independent with ADL and mobility. Pt reports performing all IADLs as well. Pt currently performing ADLs at sink in standing with no AD and fair balance. Pt performing ADL functional mobility in room with minguardA. Pt required continued OT skilled services for ADL, mobility and safety in [place]. OT to follow acutely.     Follow Up Recommendations  Home health OT    Equipment Recommendations  None recommended by OT    Recommendations for Other Services       Precautions / Restrictions Precautions Precautions: Fall Restrictions Weight Bearing Restrictions: No      Mobility Bed Mobility Overal bed mobility: Needs Assistance Bed Mobility: Rolling;Sidelying to Sit Rolling: Supervision Sidelying to sit: Supervision       General bed mobility comments: cues to scoot hips forward  Transfers Overall transfer level: Needs assistance Equipment used: None Transfers: Sit to/from Stand Sit to Stand: Min guard         General transfer comment: min guard for safety    Balance Overall balance assessment: Needs assistance Sitting-balance support: Feet supported Sitting balance-Leahy Scale: Good     Standing balance support: During functional activity;No upper extremity supported Standing balance-Leahy Scale: Fair Standing balance comment: pt able to stand at  sink to perform ADL task without UE supports, min guard for safety                           ADL either performed or assessed with clinical judgement   ADL Overall ADL's : At baseline                                       General ADL Comments: Pt requiring set-upA to minguardA for toilet hygiene, grooming at sink and ambulation for mobility with no AD.     Vision Baseline Vision/History: No visual deficits Vision Assessment?: No apparent visual deficits     Perception     Praxis      Pertinent Vitals/Pain Pain Assessment: No/denies pain     Hand Dominance Right   Extremity/Trunk Assessment Upper Extremity Assessment Upper Extremity Assessment: Overall WFL for tasks assessed   Lower Extremity Assessment Lower Extremity Assessment: Overall WFL for tasks assessed   Cervical / Trunk Assessment Cervical / Trunk Assessment: Kyphotic   Communication Communication Communication: HOH   Cognition Arousal/Alertness: Awake/alert Behavior During Therapy: WFL for tasks assessed/performed Overall Cognitive Status: Within Functional Limits for tasks assessed                                     General Comments       Exercises     Shoulder Instructions      Home Living Family/patient expects to be discharged to:: Private residence Living Arrangements: Alone Available Help at Discharge: Family;Available PRN/intermittently  Type of Home: House Home Access: Stairs to enter [employer] of Steps: 4 Entrance Stairs-Rails: Right;Left Home Layout: One level     Bathroom Shower/Tub: Occupational psychologist: Standard     Home Equipment: Environmental consultant - 2 wheels;Cane - single point;Shower seat          Prior Functioning/Environment Level of Independence: Independent        Comments: has family that drives her wherever she needs to go        OT Problem List: Decreased activity tolerance;Decreased safety  awareness      OT Treatment/Interventions: Self-care/ADL training;Therapeutic exercise;Neuromuscular education;Energy conservation;Therapeutic activities;Patient/family education;Balance training    OT Goals(Current goals can be found in the care plan section) Acute Rehab OT Goals Patient Stated Goal: "go home today" OT Goal Formulation: With patient Time For Goal Achievement: 11/26/18 Potential to Achieve Goals: Good ADL Goals Additional ADL Goal #1: Pt will state 3 ways to practice energy conservation to be used here and at home. Additional ADL Goal #2: pt will perform ADL functional mobility with no AD, good balance and ambulating a community distance.  OT Frequency: Min 2X/week   Barriers to D/C:            Co-evaluation              AM-PAC OT "6 Clicks" Daily Activity     Outcome Measure Help from another person eating meals?: None Help from another person taking care of personal grooming?: None Help from another person toileting, which includes using toliet, bedpan, or urinal?: None Help from another person bathing (including washing, rinsing, drying)?: A Little Help from another person to put on and taking off regular upper body clothing?: None Help from another person to put on and taking off regular lower body clothing?: None 6 Click Score: 23   End of Session Equipment Utilized During Treatment: Gait belt Nurse Communication: Mobility status  Activity Tolerance: Patient tolerated treatment well Patient left: in bed;with call bell/phone within reach;with bed alarm set  OT Visit Diagnosis: Unsteadiness on feet (R26.81);Muscle weakness (generalized) (M62.81)                Time: 7867-6720 OT Time Calculation (min): 25 min Charges:  OT General Charges $OT Visit: 1 Visit OT Evaluation $OT Eval Moderate Complexity: 1 Mod OT Treatments $Self Care/Home Management : 8-22 mins  Ebony Hail Harold Hedge) Marsa Aris OTR/L Acute Rehabilitation Services Pager:  [phone] Office: [place] 11/12/2018, 12:42 PM

## 2018-11-12 NOTE — Evaluation (Signed)
Physical Therapy Evaluation Patient Details Name: Alexa Mcdonald MRN: [id number] DOB: 1921-06-15 Today's Date: 11/12/2018   History of Present Illness  Pt is a 85 y/o female admitted secondary to bilateral LE edema and tachycardia. PMH including but not limited to atrial fibrillation, bradycardia, LBBB, nonobstructive CAD, stage III CKD, chronic diastolic heart failure, aortic stenosis status post TAVR, diverticulosis, recent admission from 10/22/2018-10/26/2018 during which she underwent laparoscopic cholecystectomy and ERCP with sphincterotomy for choledocholithiasis.    Clinical Impression  Pt presented sitting EOB, awake and willing to participate in therapy session. Prior to admission, pt reported that she was independent with all functional mobility and ADLs. Pt lives alone in a single level home with four steps to enter. At the time of evaluation, pt at min guard level overall for functional mobility without the use of an AD. Pt would continue to benefit from skilled physical therapy services at this time while admitted and after d/c to address the below listed limitations in order to improve overall safety and independence with functional mobility.     Follow Up Recommendations Home health PT    Equipment Recommendations  None recommended by PT    Recommendations for Other Services       Precautions / Restrictions Precautions Precautions: Fall Restrictions Weight Bearing Restrictions: No      Mobility  Bed Mobility               General bed mobility comments: pt seated EOB upon arrival  Transfers Overall transfer level: Needs assistance Equipment used: None Transfers: Sit to/from Stand Sit to Stand: Min guard         General transfer comment: min guard for safety  Ambulation/Gait Ambulation/Gait assistance: Min guard Gait Distance (Feet): 40 Feet(20' x2 with sitting break on toilet to void) Assistive device: None Gait Pattern/deviations: Step-through  pattern;Decreased stride length Gait velocity: decreased   General Gait Details: pt with mild instability but no overt LOB or need for physical assistance, min guard for safety; pt not using an AD for gait this session, but frequently reaching for support surfaces for comfort  Stairs            Wheelchair Mobility    Modified Rankin (Stroke Patients Only)       Balance Overall balance assessment: Needs assistance Sitting-balance support: Feet supported Sitting balance-Leahy Scale: Good     Standing balance support: During functional activity;No upper extremity supported Standing balance-Leahy Scale: Fair Standing balance comment: pt able to stand at sink to perform ADL task without UE supports, min guard for safety                             Pertinent Vitals/Pain Pain Assessment: No/denies pain    Home Living Family/patient expects to be discharged to:: Private residence Living Arrangements: Alone Available Help at Discharge: Family;Available PRN/intermittently Type of Home: House Home Access: Stairs to enter Entrance Stairs-Rails: Psychiatric nurse of Steps: 4 Home Layout: One level Home Equipment: Walker - 2 wheels;Cane - single point;Shower seat      Prior Function Level of Independence: Independent         Comments: has family that drives her wherever she needs to go     Hand Dominance        Extremity/Trunk Assessment   Upper Extremity Assessment Upper Extremity Assessment: Overall WFL for tasks assessed    Lower Extremity Assessment Lower Extremity Assessment: Overall WFL for tasks assessed  Cervical / Trunk Assessment Cervical / Trunk Assessment: Kyphotic  Communication   Communication: HOH  Cognition Arousal/Alertness: Awake/alert Behavior During Therapy: WFL for tasks assessed/performed Overall Cognitive Status: Within Functional Limits for tasks assessed                                         General Comments      Exercises     Assessment/Plan    PT Assessment Patient needs continued PT services  PT Problem List Decreased balance;Decreased mobility       PT Treatment Interventions DME instruction;Gait training;Stair training;Functional mobility training;Therapeutic activities;Therapeutic exercise;Balance training;Neuromuscular re-education;Patient/family education    PT Goals (Current goals can be found in the Care Plan section)  Acute Rehab PT Goals Patient Stated Goal: "go home today" PT Goal Formulation: With patient Time For Goal Achievement: 11/26/18 Potential to Achieve Goals: Good    Frequency Min 3X/week   Barriers to discharge        Co-evaluation               AM-PAC PT "6 Clicks" Mobility  Outcome Measure Help needed turning from your back to your side while in a flat bed without using bedrails?: A Little Help needed moving from lying on your back to sitting on the side of a flat bed without using bedrails?: A Little Help needed moving to and from a bed to a chair (including a wheelchair)?: A Little Help needed standing up from a chair using your arms (e.g., wheelchair or bedside chair)?: A Little Help needed to walk in hospital room?: A Little Help needed climbing 3-5 steps with a railing? : A Little 6 Click Score: 18    End of Session   Activity Tolerance: Patient tolerated treatment well Patient left: in bed;with call bell/phone within reach;with bed alarm set;Other (comment)(sitting EOB) Nurse Communication: Mobility status PT Visit Diagnosis: Other abnormalities of gait and mobility (R26.89)    Time: 1155-2080 PT Time Calculation (min) (ACUTE ONLY): 14 min   Charges:   PT Evaluation $PT Eval Moderate Complexity: 1 Mod          Sherie Don, PT, DPT  Acute Rehabilitation Services Pager [phone] Office [place] 11/12/2018, 11:14 AM

## 2018-11-12 NOTE — Progress Notes (Signed)
Patient ID: Alexa Mcdonald, female   DOB: [birth year], 85 y.o.   MRN: [id number]  PROGRESS NOTE    Alexa Mcdonald  JJH:[id number] DOB: 06/30/1921 DOA: 11/09/2018 PCP: Alexa Jordan, MD   Brief Narrative:  85 year old female with history of atrial fibrillation, bradycardia, LBBB, nonobstructive CAD, stage III CKD, chronic diastolic heart failure, aortic stenosis status post TAVR, diverticulosis, recent admission from 10/22/2018-10/26/2018 during which she underwent laparoscopic cholecystectomy and ERCP with sphincterotomy for choledocholithiasis and restarted on edoxaban at discharge home on 10/26/2018 was sent from surgeon's office for lower extremity edema and elevated heart rate.  She was found to have hemoglobin of 7.6 on presentation; hemoglobin was 11.8 on recent discharge.  GI was consulted.  Assessment & Plan:   Principal Problem:   Acute blood loss anemia Active Problems:   Bradycardia   Essential hypertension   Edema of both ankles   GERD (gastroesophageal reflux disease)   Long term current use of anticoagulant therapy   Interstitial lung disease (HCC)   Atrial fibrillation, persistent   S/P TAVR (transcatheter aortic valve replacement)   Acute on chronic diastolic CHF (congestive heart failure) (HCC)   Acute lower GI bleeding   Symptomatic anemia  Symptomatic acute blood loss anemia due to GI bleeding -Might be secondary to sphincterotomy during recent ERCP versus diverticulosis  -status post 1 unit packed red cell transfusion on 11/10/2018.  Hemoglobin is 7.8 today.  Transfuse if hemoglobin is less than 7. -Anticoagulation is on hold for now -Status post EGD on 11/11/2018 which showed erosive gastropathy and nonbleeding duodenal ulcers.  Spoke to Dr. Mansouraty/GI on 11/11/2018 on phone and he had stated that edoxaban can be resumed from today onwards.  Patient will need outpatient GI follow-up in 4 to 6 weeks for possible repeat EGD.  Continue Protonix 40 mg twice a day for the  next 2 to 3 months. -We will resume edoxaban today and monitor H&H.  If hemoglobin remains stable tomorrow, will probably consider discharging her home.  Acute on chronic diastolic heart failure -Last echo in April 2019 showed EF of 60 to 65% with grade 1 diastolic dysfunction -Treated with intravenous Lasix.  Patient does not normally take Lasix at home.  Will stop Lasix.  Strict input and output.  Daily weights.  Outpatient follow-up with cardiology.  persistent atrial fibrillation -Heart rate stable.  Anticoagulation plan as above.  Chronic kidney disease stage III -Creatinine stable.  Monitor  Hypertension--ACE inhibitor on hold.  Blood pressure stable  Interstitial lung disease next-stable.  Outpatient follow-up  Hypothyroidism-continue methimazole  Hyperlipidemia--continue statin  Status post transcatheter aortic valve replacement  Generalized deconditioning -PT/OT eval.  Palliative care evaluation appreciated.  Recommend outpatient palliative care follow-up.  Currently still full code.    DVT prophylaxis: SCDs.  Start edoxaban Code Status: Full Family Communication: None at bedside.  Tried to call son/[name] on phone, he did not pick up. Disposition Plan: Home versus rehab depending on PT evaluation.  Probably will be ready for discharge tomorrow.  Consultants: GI  Procedures:  EGD on 11/11/2018 Findings:      No gross lesions were noted in the entire esophagus.      Multiple dispersed, small to medium sized non-bleeding erosions were       found in the gastric body, at the incisura, in the gastric antrum and in       the prepyloric region of the stomach. There were stigmata of recent       bleeding in setting of  hematin being noted within the stomach - this all       lavaged off.      No other gross lesions were noted in the entire examined stomach.       Biopsies were taken with a cold forceps for histology and Helicobacter       pylori testing.      Six  non-bleeding cratered duodenal ulcers with a clean ulcer base       (Forrest Class III) were found in the duodenal bulb and in the D1/D2       sweep. The largest lesion was 10 mm in largest dimension.      There was evidence of a patent sphincterotomy at the ampulla. This was       characterized by very minimal ulceration but otherwise healthy tissue.      The second portion of the duodenum was normal. Impression:               - No gross lesions in esophagus.                           - Recently bleeding erosive gastropathy. Biopsied                            for HP.                           - Non-bleeding duodenal ulcers with a clean ulcer                            base (Forrest Class III).                           - Patent sphincterotomy, characterized by healthy                            tissue and very minimal ulceration.                           - Normal second portion of the duodenum. Recommendation:           - The patient will be observed post-procedure,                            until all discharge criteria are met.                           - Return patient to hospital ward for ongoing care.                           - Observe patient's clinical course.                           - Patient should be maintained on 40 mg PPI BID for                            the next 2-3 months. Eventually would like to  decrease that to 40 mg daily and then maintain at                            20-40 mg based on how she does thereafter in                            setting of PUD with bleeding history.                           - Observe patient's clinical course.                           - Trend Hgb/Hct.                           - With all ulcers being clean based I think it is                            OK to consider restarting NOAC in 24-48 hours. In                            effort of monitoring patient while she is in house                             before discharge home with NOAC, consider                            restarting anticoagulation tomorrow.                           - Follow up with GI in 4-6 weeks and consider                            possible role of repeat EGD in 25-month. I think                            doing in hospital would be ideal if this is pursued                            since the side-viewing endoscope was most helpful                            in identification of the duodenal sweep ulcer that                            was largest today.                           - The findings and recommendations were discussed                            with the referring physician.                           -  The findings and recommendations were discussed                            with the patient.                           - The findings and recommendations were discussed                            with the patient's family.  Antimicrobials: None   Subjective: Patient seen and examined at bedside.  She is sleepy, wakes up only very slightly, answers some questions but is very hard of hearing.  No overnight fever or vomiting or rectal bleeding reported.  Objective: Vitals:   11/11/18 1747 11/11/18 2128 11/12/18 0136 11/12/18 0510  BP: 115/65 (!) 121/46  (!) 126/49  Pulse:  71  (!) 59  Resp:  18  16  Temp:  98.7 F (37.1 C)  98.6 F (37 C)  TempSrc:  Oral  Oral  SpO2:  98%  100%  Weight:   58.1 kg   Height:        Intake/Output Summary (Last 24 hours) at 11/12/2018 1008 Last data filed at 11/11/2018 2124 Gross per 24 hour  Intake 403 ml  Output 500 ml  Net -97 ml   Filed Weights   11/11/18 0500 11/12/18 0136  Weight: 59.7 kg 58.1 kg    Examination:  General exam: Appears calm and comfortable.  Elderly female, sleepy, wakes up only very slightly.  Very hard of hearing. Respiratory system: Bilateral decreased breath sounds at bases, scattered crackles.  No wheezing Cardiovascular system: Rate  controlled, S1-S2 heard Gastrointestinal system: Abdomen is nondistended, soft and nontender. Normal bowel sounds heard. Extremities: No cyanosis; trace edema  Data Reviewed: I have personally reviewed following labs and imaging studies  CBC: Recent Labs  Lab 11/09/18 1315 11/09/18 1920 11/10/18 0456 11/10/18 1130 11/11/18 0220 11/12/18 0235  WBC 9.4  --  7.4  --  7.9 8.4  NEUTROABS 8.0*  --   --   --   --  7.2  HGB 7.6* 7.6* 8.2* 9.5* 8.3* 7.8*  HCT 24.8* 24.3* 25.7* 29.1* 25.9* 24.6*  MCV 95.0  --  90.2  --  89.3 88.8  PLT 568*  --  450*  --  447* 888*   Basic Metabolic Panel: Recent Labs  Lab 11/09/18 1315 11/10/18 0456 11/11/18 0220 11/12/18 0235  NA 136 136 140 134*  K 4.2 3.8 3.3* 3.8  CL 103 101 101 97*  CO2 '24 24 26 26  '$ GLUCOSE 147* 99 76 248*  BUN '18 18 17 '$ 30*  CREATININE 1.28* 1.42* 1.66* 1.93*  CALCIUM 8.3* 8.1* 8.1* 7.7*  MG  --   --   --  1.9   GFR: Estimated Creatinine Clearance: 13.8 mL/min (A) (by C-G formula based on SCr of 1.93 mg/dL (H)). Liver Function Tests: Recent Labs  Lab 11/09/18 1315  AST 17  ALT 19  ALKPHOS 84  BILITOT 1.1  PROT 6.2*  ALBUMIN 3.2*   No results for input(s): LIPASE, AMYLASE in the last 168 hours. No results for input(s): AMMONIA in the last 168 hours. Coagulation Profile: No results for input(s): INR, PROTIME in the last 168 hours. Cardiac Enzymes: No results for input(s): CKTOTAL, CKMB, CKMBINDEX, TROPONINI in the last 168 hours. BNP (last 3 results) No results  for input(s): PROBNP in the last 8760 hours. HbA1C: No results for input(s): HGBA1C in the last 72 hours. CBG: No results for input(s): GLUCAP in the last 168 hours. Lipid Profile: No results for input(s): CHOL, HDL, LDLCALC, TRIG, CHOLHDL, LDLDIRECT in the last 72 hours. Thyroid Function Tests: No results for input(s): TSH, T4TOTAL, FREET4, T3FREE, THYROIDAB in the last 72 hours. Anemia Panel: Recent Labs    11/10/18 0456  FERRITIN 19  TIBC  318  IRON 35   Sepsis Labs: No results for input(s): PROCALCITON, LATICACIDVEN in the last 168 hours.  No results found for this or any previous visit (from the past 240 hour(s)).       Radiology Studies: No results found.      Scheduled Meds: . atorvastatin  10 mg Oral Daily  . methimazole  7.5 mg Oral QHS  . sodium chloride flush  3 mL Intravenous Q12H  . sodium chloride flush  3 mL Intravenous Q12H   Continuous Infusions: . sodium chloride    . ferumoxytol       LOS: 3 days        Aline August, MD [facility] 11/12/2018, 10:08 AM

## 2018-11-13 LAB — BASIC METABOLIC PANEL
Anion gap: 12 (ref 5–15)
BUN: 30 mg/dL — ABNORMAL HIGH (ref 8–23)
CO2: 26 mmol/L (ref 22–32)
Calcium: 7.9 mg/dL — ABNORMAL LOW (ref 8.9–10.3)
Chloride: 99 mmol/L (ref 98–111)
Creatinine, Ser: 1.72 mg/dL — ABNORMAL HIGH (ref 0.44–1.00)
GFR calc Af Amer: 29 mL/min — ABNORMAL LOW (ref >60)
GFR calc non Af Amer: 25 mL/min — ABNORMAL LOW (ref >60)
Glucose, Bld: 132 mg/dL — ABNORMAL HIGH (ref 70–99)
Potassium: 3.5 mmol/L (ref 3.5–5.1)
Sodium: 137 mmol/L (ref 135–145)

## 2018-11-13 LAB — CBC WITH DIFFERENTIAL/PLATELET
Abs Immature Granulocytes: 0.05 10*3/uL (ref 0.00–0.07)
Basophils Absolute: 0 10*3/uL (ref 0.0–0.1)
Basophils Relative: 0 %
Eosinophils Absolute: 0.1 10*3/uL (ref 0.0–0.5)
Eosinophils Relative: 1 %
HCT: 25.6 % — ABNORMAL LOW (ref 36.0–46.0)
Hemoglobin: 8.4 g/dL — ABNORMAL LOW (ref 12.0–15.0)
Immature Granulocytes: 1 %
Lymphocytes Relative: 18 %
Lymphs Abs: 1.7 10*3/uL (ref 0.7–4.0)
MCH: 29.5 pg (ref 26.0–34.0)
MCHC: 32.8 g/dL (ref 30.0–36.0)
MCV: 89.8 fL (ref 80.0–100.0)
Monocytes Absolute: 0.8 10*3/uL (ref 0.1–1.0)
Monocytes Relative: 8 %
Neutro Abs: 6.7 10*3/uL (ref 1.7–7.7)
Neutrophils Relative %: 72 %
Platelets: 386 10*3/uL (ref 150–400)
RBC: 2.85 MIL/uL — ABNORMAL LOW (ref 3.87–5.11)
RDW: 16.4 % — ABNORMAL HIGH (ref 11.5–15.5)
WBC: 9.3 10*3/uL (ref 4.0–10.5)
nRBC: 0 % (ref 0.0–0.2)

## 2018-11-13 LAB — TSH: TSH: 1.919 u[IU]/mL (ref 0.350–4.500)

## 2018-11-13 LAB — MAGNESIUM: Magnesium: 2.1 mg/dL (ref 1.7–2.4)

## 2018-11-13 MED ORDER — PANTOPRAZOLE SODIUM 40 MG PO TBEC: 40.0000 mg | DELAYED_RELEASE_TABLET | Freq: Every day | ORAL | 0 refills | Status: DC

## 2018-11-13 MED ORDER — FERROUS SULFATE 325 (65 FE) MG PO TABS: 325.0000 mg | ORAL_TABLET | Freq: Every morning | ORAL | 0 refills | Status: DC

## 2018-11-13 NOTE — Progress Notes (Signed)
Physical Therapy Treatment Patient Details Name: Alexa Mcdonald MRN: [id number] DOB: Dec 05, 1920 Today's Date: 11/13/2018    History of Present Illness Pt is a 85 y/o female admitted secondary to bilateral LE edema and tachycardia. PMH including but not limited to atrial fibrillation, bradycardia, LBBB, nonobstructive CAD, stage III CKD, chronic diastolic heart failure, aortic stenosis status post TAVR, diverticulosis, recent admission from 10/22/2018-10/26/2018 during which she underwent laparoscopic cholecystectomy and ERCP with sphincterotomy for choledocholithiasis.    PT Comments    Pt making steady progress with functional mobility. Pt would continue to benefit from skilled physical therapy services at this time while admitted and after d/c to address the below listed limitations in order to improve overall safety and independence with functional mobility.    Follow Up Recommendations  Home health PT     Equipment Recommendations  None recommended by PT    Recommendations for Other Services       Precautions / Restrictions Precautions Precautions: Fall Restrictions Weight Bearing Restrictions: No    Mobility  Bed Mobility Overal bed mobility: Needs Assistance Bed Mobility: Supine to Sit     Supine to sit: Supervision        Transfers Overall transfer level: Needs assistance Equipment used: None Transfers: Sit to/from Stand Sit to Stand: Min guard         General transfer comment: min guard for safety  Ambulation/Gait Ambulation/Gait assistance: Min guard;Min assist Gait Distance (Feet): 100 Feet Assistive device: None;1 person hand held assist Gait Pattern/deviations: Step-through pattern;Decreased stride length;Drifts right/left Gait velocity: decreased   General Gait Details: pt with mild instability and minor LOB x2 requiring min A to recover   Stairs             Wheelchair Mobility    Modified Rankin (Stroke Patients Only)        Balance Overall balance assessment: Needs assistance Sitting-balance support: Feet supported Sitting balance-Leahy Scale: Good     Standing balance support: During functional activity;No upper extremity supported Standing balance-Leahy Scale: Fair                              Cognition Arousal/Alertness: Awake/alert Behavior During Therapy: WFL for tasks assessed/performed Overall Cognitive Status: Within Functional Limits for tasks assessed                                        Exercises      General Comments        Pertinent Vitals/Pain Pain Assessment: No/denies pain    Home Living                      Prior Function            PT Goals (current goals can now be found in the care plan section) Acute Rehab PT Goals PT Goal Formulation: With patient Time For Goal Achievement: 11/26/18 Potential to Achieve Goals: Good Progress towards PT goals: Progressing toward goals    Frequency    Min 3X/week      PT Plan Current plan remains appropriate    Co-evaluation              AM-PAC PT "6 Clicks" Mobility   Outcome Measure  Help needed turning from your back to your side while in a flat bed without using bedrails?: None Help  needed moving from lying on your back to sitting on the side of a flat bed without using bedrails?: None Help needed moving to and from a bed to a chair (including a wheelchair)?: A Little Help needed standing up from a chair using your arms (e.g., wheelchair or bedside chair)?: A Little Help needed to walk in hospital room?: A Little Help needed climbing 3-5 steps with a railing? : A Little 6 Click Score: 20    End of Session Equipment Utilized During Treatment: Gait belt Activity Tolerance: Patient tolerated treatment well Patient left: in bed;with call bell/phone within reach;with bed alarm set;Other (comment)(sitting EOB) Nurse Communication: Mobility status PT Visit Diagnosis: Other  abnormalities of gait and mobility (R26.89)     Time: 5520-8022 PT Time Calculation (min) (ACUTE ONLY): 14 min  Charges:  $Gait Training: 8-22 mins                     Sherie Don, [place], DPT  Acute Rehabilitation Services Pager [phone] Office [place] 11/13/2018, 2:11 PM

## 2018-11-13 NOTE — Discharge Summary (Signed)
Physician Discharge Summary  AMAIRANI SHUEY QAS:[id number] DOB: Jan 08, 1921 DOA: 11/09/2018  PCP: Jonathon Jordan, MD  Admit date: 11/09/2018 Discharge date: 11/13/2018  Admitted From: Home Disposition: Home  Recommendations for Outpatient Follow-up:  1. Follow up with PCP in 1 week with repeat CBC/BMP 2. Outpatient follow-up with GI 3. Follow up in ED if symptoms worsen or new appear   Home Health: Home health PT/OT Equipment/Devices: None  Discharge Condition: Stable CODE STATUS: Full Diet recommendation: Heart healthy  Brief/Interim Summary: 85 year old female with history of atrial fibrillation, bradycardia, LBBB, nonobstructive CAD, stage III CKD, chronic diastolic heart failure, aortic stenosis status post TAVR, diverticulosis, recent admission from 10/22/2018-10/26/2018 during which she underwent laparoscopic cholecystectomy and ERCP with sphincterotomy for choledocholithiasis and restarted on edoxaban at discharge home on 10/26/2018 was sent from surgeon's office for lower extremity edema and elevated heart rate.  She was found to have hemoglobin of 7.6 on presentation; hemoglobin was 11.8 on recent discharge.  GI was consulted.  She underwent EGD on 11/11/2018.  Edoxaban was restarted on 11/12/2018.  Hemoglobin has remained stable.  GI has signed off.  Will discharge patient home on oral Protonix.   Discharge Diagnoses:  Principal Problem:   Acute blood loss anemia Active Problems:   Bradycardia   Essential hypertension   Edema of both ankles   GERD (gastroesophageal reflux disease)   Chronic anticoagulation   Interstitial lung disease (HCC)   Atrial fibrillation, persistent   S/P TAVR (transcatheter aortic valve replacement)   Acute on chronic diastolic CHF (congestive heart failure) (HCC)   Acute lower GI bleeding   Symptomatic anemia   Iron deficiency anemia due to chronic blood loss   Erosive gastritis  Symptomatic acute blood loss anemia due to GI bleeding -status  post 1 unit packed red cell transfusion on 11/10/2018.  Hemoglobin is 8.4 today.   -Anticoagulation  was initially held. -Status post EGD on 11/11/2018 which showed erosive gastropathy and nonbleeding duodenal ulcers.   -Edoxaban was restarted on 11/12/2018.  Hemoglobin has remained stable and no further backslash bloody stools. -Final GI recommendation: Protonix 40 mg daily along with once daily short-term iron therapy.    Acute on chronic diastolic heart failure -Last echo in April 2019 showed EF of 60 to 65% with grade 1 diastolic dysfunction -Treated with intravenous Lasix.  Patient does not normally take Lasix at home.    Lasix was subsequently stopped.  Outpatient follow-up with cardiology.  persistent atrial fibrillation -Heart rate stable.  Anticoagulation plan as above.  Chronic kidney disease stage III -Creatinine stable.  Monitor  Hypertension--ACE inhibitor on hold.  Blood pressure stable.  Outpatient follow-up  Interstitial lung disease -stable.  Outpatient follow-up  Hypothyroidism-continue methimazole  Hyperlipidemia--continue statin  Status post transcatheter aortic valve replacement  Generalized deconditioning -Palliative care evaluation appreciated.  Recommend outpatient palliative care follow-up.  Currently still full code. -We will need home PT/OT   Discharge Instructions  Discharge Instructions    Ambulatory referral to Cardiology   Complete by:  As directed    Afib   Ambulatory referral to Gastroenterology   Complete by:  As directed    Follow up for GI bleed   Call MD for:  extreme fatigue   Complete by:  As directed    Call MD for:  persistant dizziness or light-headedness   Complete by:  As directed    Call MD for:  persistant nausea and vomiting   Complete by:  As directed    Call MD for:  severe uncontrolled pain   Complete by:  As directed    Diet - low sodium heart healthy   Complete by:  As directed    Increase activity slowly    Complete by:  As directed      Allergies as of 11/13/2018      Reactions   Amoxicillin Other (See Comments)   tongue felt bruised Did it involve swelling of the face/tongue/throat, SOB, or low BP? No Did it involve sudden or severe rash/hives, skin peeling, or any reaction on the inside of your mouth or nose? No Did you need to seek medical attention at a hospital or doctor's office? No When did it last happen?childhood If all above answers are "NO", may proceed with cephalosporin use.   Celebrex [celecoxib] Other (See Comments)   Mouth sores      Medication List    STOP taking these medications   aspirin EC 81 MG tablet   lisinopril 10 MG tablet Commonly known as:  PRINIVIL,ZESTRIL   traMADol 50 MG tablet Commonly known as:  ULTRAM     TAKE these medications   acetaminophen 325 MG tablet Commonly known as:  TYLENOL Take 2 tablets (650 mg total) by mouth every 6 (six) hours as needed for mild pain.   atorvastatin 10 MG tablet Commonly known as:  LIPITOR Take 10 mg daily by mouth.   calcium-vitamin D 250-100 MG-UNIT tablet Take 1 tablet by mouth daily.   edoxaban 30 MG Tabs tablet Commonly known as:  SAVAYSA Take 1 tablet (30 mg total) by mouth daily.   ferrous sulfate 325 (65 FE) MG tablet Take 1 tablet (325 mg total) by mouth every morning.   methimazole 5 MG tablet Commonly known as:  TAPAZOLE Take 7.5 mg by mouth daily.   OCUVITE PRESERVISION PO Take 1 capsule by mouth daily.   pantoprazole 40 MG tablet Commonly known as:  PROTONIX Take 1 tablet (40 mg total) by mouth daily at 6 (six) AM. Start taking on:  November 14, 2018   vitamin B-12 100 MCG tablet Commonly known as:  CYANOCOBALAMIN Take 100 mcg by mouth daily.   VITAMIN D PO Take 1 tablet by mouth daily.      Follow-up Information    Jonathon Jordan, MD. Schedule an appointment as soon as possible for a visit in 1 week(s).   Specialty:  Family Medicine Why:  With repeat  CBC/BMP Contact information: [address] [place] [address] [phone]          Allergies  Allergen Reactions  . Amoxicillin Other (See Comments)    tongue felt bruised Did it involve swelling of the face/tongue/throat, SOB, or low BP? No Did it involve sudden or severe rash/hives, skin peeling, or any reaction on the inside of your mouth or nose? No Did you need to seek medical attention at a hospital or doctor's office? No When did it last happen?childhood If all above answers are "NO", may proceed with cephalosporin use.    . Celebrex [Celecoxib] Other (See Comments)    Mouth sores    Consultations:  GI/palliative care   Procedures/Studies: Ct Abdomen Pelvis Wo Contrast  Result Date: 10/22/2018 CLINICAL DATA:  Nausea and loss of appetite. EXAM: CT ABDOMEN AND PELVIS WITHOUT CONTRAST TECHNIQUE: Multidetector CT imaging of the abdomen and pelvis was performed following the standard protocol without IV contrast. COMPARISON:  Body CT 10/24/2015 FINDINGS: Lower chest: Chronic interstitial lung changes. Enlarged of the cardiac silhouette. Heavy calcifications of  the mitral valve annulus. Artificial aortic valve noted. Hepatobiliary: Is normal appearance of the liver. In the gallbladder as dilated. There is dilation of the extrahepatic common bile duct as well. High density material within the distal common bile duct may represent obstructive choledocholithiasis. Pancreas: Unremarkable. No pancreatic ductal dilatation or surrounding inflammatory changes. Spleen: Normal in size without focal abnormality. Adrenals/Urinary Tract: Adrenal glands are unremarkable. Bilateral renal cortical thinning and perinephric fat stranding, nonspecific. Kidneys are without renal calculi, focal lesion, or hydronephrosis. Bladder is unremarkable. Stomach/Bowel: Stomach is within normal limits. No evidence of bowel wall thickening, distention, or inflammatory changes. Diffuse  colonic diverticulosis without evidence of diverticulitis. Vascular/Lymphatic: Aortic atherosclerosis. No enlarged abdominal or pelvic lymph nodes. Reproductive: Uterus and bilateral adnexa are unremarkable. Other: No abdominal wall hernia or abnormality. No abdominopelvic ascites. Musculoskeletal: No acute or significant osseous findings. IMPRESSION: 1. Dilation of the gallbladder and common bile duct with high density material within the distal common bile duct which is suspicious for obstructive choledocholithiasis. 2. Diffuse colonic diverticulosis without evidence of diverticulitis. Electronically Signed   By: Fidela Salisbury M.D.   On: 10/22/2018 22:56   Dg Chest 2 View  Result Date: 11/09/2018 CLINICAL DATA:  Pt stated she didn't know why she was here and has no current or previous chest complaints, HTN, diabetic, nonsmoker EXAM: CHEST - 2 VIEW COMPARISON:  11/28/2015 FINDINGS: Cardiac silhouette is normal in size. There are stable changes from endovascular aortic valve replacement. No mediastinal or hilar masses. There is no evidence of adenopathy. There are prominent bronchovascular and interstitial lung markings similar to prior exams. No lung consolidation. No pleural effusion or pneumothorax. Skeletal structures are demineralized but grossly intact. IMPRESSION: No acute cardiopulmonary disease. Electronically Signed   By: Lajean Manes M.D.   On: 11/09/2018 13:59   Dg Ercp With Sphincterotomy  Result Date: 10/24/2018 CLINICAL DATA:  Cholangitis, probable choledocholithiasis EXAM: ERCP TECHNIQUE: Multiple spot images obtained with the fluoroscopic device and submitted for interpretation post-procedure. COMPARISON:  CT 10/22/2018 FINDINGS: A series of fluoroscopic spot images document endoscopic cannulation and opacification of the CBD. Filling defects within the proximal CBD which is distended. Incomplete opacification of the biliary tree, which appears mildly distended centrally. Subsequent  images document balloon catheter passage through the CBD with apparent clearance of filling defects. No evidence of extravasation. IMPRESSION: Endoscopic CBD cannulation and intervention. These images were submitted for radiologic interpretation only. Please see the procedural report for the amount of contrast and the fluoroscopy time utilized. Electronically Signed   By: Lucrezia Europe M.D.   On: 10/24/2018 14:25     EGD on 11/11/2018 Findings: No gross lesions were noted in the entire esophagus. Multiple dispersed, small to medium sized non-bleeding erosions were  found in the gastric body, at the incisura, in the gastric antrum and in  the prepyloric region of the stomach. There were stigmata of recent  bleeding in setting of hematin being noted within the stomach - this all  lavaged off. No other gross lesions were noted in the entire examined stomach.  Biopsies were taken with a cold forceps for histology and Helicobacter  pylori testing. Six non-bleeding cratered duodenal ulcers with a clean ulcer base  (Forrest Class III) were found in the duodenal bulb and in the D1/D2  sweep. The largest lesion was 10 mm in largest dimension. There was evidence of a patent sphincterotomy at the ampulla. This was  characterized by very minimal ulceration but otherwise healthy tissue. The second portion of the  duodenum was normal. Impression: - No gross lesions in esophagus. - Recently bleeding erosive gastropathy. Biopsied  for HP. - Non-bleeding duodenal ulcers with a clean ulcer  base (Forrest Class III). - Patent sphincterotomy, characterized by healthy  tissue and very minimal ulceration. - Normal second portion of the  duodenum. Recommendation: - The patient will be observed post-procedure,  until all discharge criteria are met. - Return patient to hospital ward for ongoing care. - Observe patient's clinical course. - Patient should be maintained on 40 mg PPI BID for  the next 2-3 months. Eventually would like to  decrease that to 40 mg daily and then maintain at  20-40 mg based on how she does thereafter in  setting of PUD with bleeding history. - Observe patient's clinical course. - Trend Hgb/Hct. - With all ulcers being clean based I think it is  OK to consider restarting NOAC in 24-48 hours. In  effort of monitoring patient while she is in house  before discharge home with NOAC, consider  restarting anticoagulation tomorrow. - Follow up with GI in 4-6 weeks and consider  possible role of repeat EGD in 100-month. I think  doing in hospital would be ideal if this is pursued  since the side-viewing endoscope was most helpful  in identification of the duodenal sweep ulcer that  was largest today. - The findings and recommendations were discussed  with the referring physician. - The findings and recommendations were discussed  with the patient. - The findings and recommendations were discussed  with the  patient's family.   Subjective: Patient seen and examined at bedside.  She is slightly awake but very hard of hearing.  Poor historian.  No overnight hematochezia or melena reported by nursing staff.  No fevers.  Discharge Exam: Vitals:   11/12/18 2045 11/13/18 0509  BP: (!) 113/49 (!) 117/52  Pulse: (!) 46 (!) 53  Resp: 16 16  Temp: 98.3 F (36.8 C) 98.4 F (36.9 C)  SpO2: 97% 98%    General: Pt is slightly awake, very hard of hearing.  Poor historian.  Does not answer much questions Cardiovascular: Bradycardic, S1/S2 + Respiratory: bilateral decreased breath sounds at bases Abdominal: Soft, NT, ND, bowel sounds + Extremities: no edema, no cyanosis    The results of significant diagnostics from this hospitalization (including imaging, microbiology, ancillary and laboratory) are listed below for reference.     Microbiology: No results found for this or any previous visit (from the past 240 hour(s)).   Labs: BNP (last 3 results) Recent Labs    11/09/18 1412 11/09/18 1920 11/10/18 0456  BNP 588.1* 458.9* 5494.4   Basic Metabolic Panel: Recent Labs  Lab 11/09/18 1315 11/10/18 0456 11/11/18 0220 11/12/18 0235 11/13/18 0356  NA 136 136 140 134* 137  K 4.2 3.8 3.3* 3.8 3.5  CL 103 101 101 97* 99  CO2 _0 GLUCOSE 147* 99 76 248* 132*  BUN _1 30* 30*  CREATININE 1.28* 1.42* 1.66* 1.93* 1.72*  CALCIUM 8.3* 8.1* 8.1* 7.7* 7.9*  MG  --   --   --  1.9 2.1   Liver Function Tests: Recent Labs  Lab 11/09/18 1315  AST 17  ALT 19  ALKPHOS 84  BILITOT 1.1  PROT 6.2*  ALBUMIN 3.2*   No results for input(s): LIPASE, AMYLASE in the last 168 hours. No results for input(s): AMMONIA in the last 168 hours. CBC: Recent Labs  Lab 11/09/18 1315  11/10/18 0456  11/10/18 1130 11/11/18 0220 11/12/18 0235 11/13/18 0356  WBC 9.4  --  7.4  --  7.9 8.4 9.3  NEUTROABS 8.0*  --   --   --   --  7.2 6.7  HGB 7.6*   < > 8.2* 9.5* 8.3* 7.8* 8.4*  HCT 24.8*    < > 25.7* 29.1* 25.9* 24.6* 25.6*  MCV 95.0  --  90.2  --  89.3 88.8 89.8  PLT 568*  --  450*  --  447* 433* 386   < > = values in this interval not displayed.   Cardiac Enzymes: No results for input(s): CKTOTAL, CKMB, CKMBINDEX, TROPONINI in the last 168 hours. BNP: Invalid input(s): POCBNP CBG: No results for input(s): GLUCAP in the last 168 hours. D-Dimer No results for input(s): DDIMER in the last 72 hours. Hgb A1c No results for input(s): HGBA1C in the last 72 hours. Lipid Profile No results for input(s): CHOL, HDL, LDLCALC, TRIG, CHOLHDL, LDLDIRECT in the last 72 hours. Thyroid function studies Recent Labs    11/13/18 0356  TSH 1.919   Anemia work up No results for input(s): VITAMINB12, FOLATE, FERRITIN, TIBC, IRON, RETICCTPCT in the last 72 hours. Urinalysis    Component Value Date/Time   COLORURINE YELLOW 10/22/2018 2341   APPEARANCEUR HAZY (A) 10/22/2018 2341   LABSPEC 1.017 10/22/2018 2341   PHURINE 5.0 10/22/2018 2341   GLUCOSEU 50 (A) 10/22/2018 2341   HGBUR MODERATE (A) 10/22/2018 2341   BILIRUBINUR NEGATIVE 10/22/2018 2341   KETONESUR 20 (A) 10/22/2018 2341   PROTEINUR 30 (A) 10/22/2018 2341   NITRITE NEGATIVE 10/22/2018 2341   LEUKOCYTESUR NEGATIVE 10/22/2018 2341   Sepsis Labs Invalid input(s): PROCALCITONIN,  WBC,  LACTICIDVEN Microbiology No results found for this or any previous visit (from the past 240 hour(s)).   Time coordinating discharge: 35 minutes  SIGNED:   Aline August, MD  [facility] 11/13/2018, 10:01 AM

## 2018-11-13 NOTE — TOC Transition Note (Signed)
Transition of Care [facility]) - CM/SW Discharge Note   Patient Details  Name: Alexa Mcdonald MRN: [id number] Date of Birth: 1920/08/11  [facility] CM/SW Contact:  Carles Collet, RN Phone Number: 11/13/2018, 10:24 AM   Clinical Narrative:    Damaris Schooner w patient's son and he is agreeable to continue [facility] services with [facility]. No DME needs.  Confirmed w [facility] rep. No other CM needs identified.  Prevo,[name] Son   [phone]       Final next level of care: [place] Barriers to Discharge: No Barriers Identified   Patient Goals and CMS Choice Patient states their goals for this hospitalization and ongoing recovery are:: to return home CMS Medicare.gov Compare Post Acute Care list provided to:: Other (Comment Required)(son) Choice offered to / list presented to : Adult Children  Discharge Placement                       Discharge Plan and Services     Post Acute Care Choice: Home Health, Resumption of Svcs/PTA Provider                    Social Determinants of Health (SDOH) Interventions     Readmission Risk Interventions No flowsheet data found.

## 2018-11-13 NOTE — Plan of Care (Signed)
Alexa Mcdonald discharged Home per MD order.  Discharge instructions reviewed and discussed with the patient, all questions and concerns answered. Copy of instructions and scripts given to patient.  Allergies as of 11/13/2018       Reactions   Amoxicillin Other (See Comments)   tongue felt bruised Did it involve swelling of the face/tongue/throat, SOB, or low BP? No Did it involve sudden or severe rash/hives, skin peeling, or any reaction on the inside of your mouth or nose? No Did you need to seek medical attention at a hospital or doctor's office? No When did it last happen?     childhood  If all above answers are "NO", may proceed with cephalosporin use.   Celebrex [celecoxib] Other (See Comments)   Mouth sores        Medication List     STOP taking these medications    aspirin EC 81 MG tablet   lisinopril 10 MG tablet Commonly known as:  PRINIVIL,ZESTRIL   traMADol 50 MG tablet Commonly known as:  ULTRAM       TAKE these medications    acetaminophen 325 MG tablet Commonly known as:  TYLENOL Take 2 tablets (650 mg total) by mouth every 6 (six) hours as needed for mild pain.   atorvastatin 10 MG tablet Commonly known as:  LIPITOR Take 10 mg daily by mouth.   calcium-vitamin D 250-100 MG-UNIT tablet Take 1 tablet by mouth daily.   edoxaban 30 MG Tabs tablet Commonly known as:  SAVAYSA Take 1 tablet (30 mg total) by mouth daily.   ferrous sulfate 325 (65 FE) MG tablet Take 1 tablet (325 mg total) by mouth every morning.   methimazole 5 MG tablet Commonly known as:  TAPAZOLE Take 7.5 mg by mouth daily.   OCUVITE PRESERVISION PO Take 1 capsule by mouth daily.   pantoprazole 40 MG tablet Commonly known as:  PROTONIX Take 1 tablet (40 mg total) by mouth daily at 6 (six) AM. Start taking on:  November 14, 2018   vitamin B-12 100 MCG tablet Commonly known as:  CYANOCOBALAMIN Take 100 mcg by mouth daily.   VITAMIN D PO Take 1 tablet by mouth daily.        Patients skin is clean, dry and intact, no evidence of skin break down. IV site discontinued and catheter remains intact. Site without signs and symptoms of complications. Dressing and pressure applied.  Patient escorted to car by NT in a wheelchair,  no distress noted upon discharge.  Alexa Mcdonald 11/13/2018 12:57 PM

## 2018-11-14 ENCOUNTER — Encounter (HOSPITAL_COMMUNITY): Payer: Self-pay | Admitting: Gastroenterology

## 2018-11-15 DIAGNOSIS — K8051 Calculus of bile duct without cholangitis or cholecystitis with obstruction: Secondary | ICD-10-CM | POA: Diagnosis not present

## 2018-11-15 DIAGNOSIS — N183 Chronic kidney disease, stage 3 (moderate): Secondary | ICD-10-CM | POA: Diagnosis not present

## 2018-11-15 DIAGNOSIS — Z48815 Encounter for surgical aftercare following surgery on the digestive system: Secondary | ICD-10-CM | POA: Diagnosis not present

## 2018-11-15 DIAGNOSIS — I5032 Chronic diastolic (congestive) heart failure: Secondary | ICD-10-CM | POA: Diagnosis not present

## 2018-11-15 DIAGNOSIS — E1122 Type 2 diabetes mellitus with diabetic chronic kidney disease: Secondary | ICD-10-CM | POA: Diagnosis not present

## 2018-11-15 DIAGNOSIS — I130 Hypertensive heart and chronic kidney disease with heart failure and stage 1 through stage 4 chronic kidney disease, or unspecified chronic kidney disease: Secondary | ICD-10-CM | POA: Diagnosis not present

## 2018-11-16 DIAGNOSIS — E1122 Type 2 diabetes mellitus with diabetic chronic kidney disease: Secondary | ICD-10-CM | POA: Diagnosis not present

## 2018-11-16 DIAGNOSIS — K8051 Calculus of bile duct without cholangitis or cholecystitis with obstruction: Secondary | ICD-10-CM | POA: Diagnosis not present

## 2018-11-16 DIAGNOSIS — I130 Hypertensive heart and chronic kidney disease with heart failure and stage 1 through stage 4 chronic kidney disease, or unspecified chronic kidney disease: Secondary | ICD-10-CM | POA: Diagnosis not present

## 2018-11-16 DIAGNOSIS — Z48815 Encounter for surgical aftercare following surgery on the digestive system: Secondary | ICD-10-CM | POA: Diagnosis not present

## 2018-11-16 DIAGNOSIS — D62 Acute posthemorrhagic anemia: Secondary | ICD-10-CM | POA: Diagnosis not present

## 2018-11-16 DIAGNOSIS — N183 Chronic kidney disease, stage 3 (moderate): Secondary | ICD-10-CM | POA: Diagnosis not present

## 2018-11-16 DIAGNOSIS — K279 Peptic ulcer, site unspecified, unspecified as acute or chronic, without hemorrhage or perforation: Secondary | ICD-10-CM | POA: Diagnosis not present

## 2018-11-16 DIAGNOSIS — K3189 Other diseases of stomach and duodenum: Secondary | ICD-10-CM | POA: Diagnosis not present

## 2018-11-16 DIAGNOSIS — I5032 Chronic diastolic (congestive) heart failure: Secondary | ICD-10-CM | POA: Diagnosis not present

## 2018-11-16 NOTE — Telephone Encounter (Signed)
**Note De-Identified  Obfuscation** Letter received from [employer] stating that they have denied coverage of the pts Savaysa. Reason: The pt must first try and fail 2 of the following: Eliquis, Pradaxa, or Xarelto  Will forward to Dr [name] and his nurse for advisement to the pt.

## 2018-11-16 NOTE — Telephone Encounter (Signed)
Seems like apixaban 2.5 mg BID might be safest drug for her. thanks

## 2018-11-17 NOTE — Telephone Encounter (Signed)
I will call patient and family the beginning of next week, as I need to know the count of her meds for edoxaban. Thanks for all yalls help. Happy Wednesday.

## 2018-11-18 DIAGNOSIS — K8051 Calculus of bile duct without cholangitis or cholecystitis with obstruction: Secondary | ICD-10-CM | POA: Diagnosis not present

## 2018-11-18 DIAGNOSIS — I5032 Chronic diastolic (congestive) heart failure: Secondary | ICD-10-CM | POA: Diagnosis not present

## 2018-11-18 DIAGNOSIS — N183 Chronic kidney disease, stage 3 (moderate): Secondary | ICD-10-CM | POA: Diagnosis not present

## 2018-11-18 DIAGNOSIS — Z48815 Encounter for surgical aftercare following surgery on the digestive system: Secondary | ICD-10-CM | POA: Diagnosis not present

## 2018-11-18 DIAGNOSIS — I130 Hypertensive heart and chronic kidney disease with heart failure and stage 1 through stage 4 chronic kidney disease, or unspecified chronic kidney disease: Secondary | ICD-10-CM | POA: Diagnosis not present

## 2018-11-18 DIAGNOSIS — E1122 Type 2 diabetes mellitus with diabetic chronic kidney disease: Secondary | ICD-10-CM | POA: Diagnosis not present

## 2018-11-19 DIAGNOSIS — I130 Hypertensive heart and chronic kidney disease with heart failure and stage 1 through stage 4 chronic kidney disease, or unspecified chronic kidney disease: Secondary | ICD-10-CM | POA: Diagnosis not present

## 2018-11-19 DIAGNOSIS — E1122 Type 2 diabetes mellitus with diabetic chronic kidney disease: Secondary | ICD-10-CM | POA: Diagnosis not present

## 2018-11-19 DIAGNOSIS — I5032 Chronic diastolic (congestive) heart failure: Secondary | ICD-10-CM | POA: Diagnosis not present

## 2018-11-19 DIAGNOSIS — Z48815 Encounter for surgical aftercare following surgery on the digestive system: Secondary | ICD-10-CM | POA: Diagnosis not present

## 2018-11-19 DIAGNOSIS — N183 Chronic kidney disease, stage 3 (moderate): Secondary | ICD-10-CM | POA: Diagnosis not present

## 2018-11-19 DIAGNOSIS — K8051 Calculus of bile duct without cholangitis or cholecystitis with obstruction: Secondary | ICD-10-CM | POA: Diagnosis not present

## 2018-11-21 NOTE — Telephone Encounter (Signed)
Sounds great thanks

## 2018-11-22 ENCOUNTER — Encounter: Payer: Self-pay | Admitting: *Deleted

## 2018-11-22 DIAGNOSIS — N183 Chronic kidney disease, stage 3 (moderate): Secondary | ICD-10-CM | POA: Diagnosis not present

## 2018-11-22 DIAGNOSIS — Z48815 Encounter for surgical aftercare following surgery on the digestive system: Secondary | ICD-10-CM | POA: Diagnosis not present

## 2018-11-22 DIAGNOSIS — I5032 Chronic diastolic (congestive) heart failure: Secondary | ICD-10-CM | POA: Diagnosis not present

## 2018-11-22 DIAGNOSIS — I130 Hypertensive heart and chronic kidney disease with heart failure and stage 1 through stage 4 chronic kidney disease, or unspecified chronic kidney disease: Secondary | ICD-10-CM | POA: Diagnosis not present

## 2018-11-22 DIAGNOSIS — E1122 Type 2 diabetes mellitus with diabetic chronic kidney disease: Secondary | ICD-10-CM | POA: Diagnosis not present

## 2018-11-22 DIAGNOSIS — Z006 Encounter for examination for normal comparison and control in clinical research program: Secondary | ICD-10-CM

## 2018-11-22 DIAGNOSIS — K8051 Calculus of bile duct without cholangitis or cholecystitis with obstruction: Secondary | ICD-10-CM | POA: Diagnosis not present

## 2018-11-22 NOTE — Research (Signed)
Envisage 53M  Visit was conducted over phone due to [place].  Spoke with patient son, he said mom was doing pretty good. She is making progress since getting out of hospital. No med changes per son since last visit.  Dr [name] has decided to switch her to Eliquis from Edoxaban. Will bring patient back in one month to check CBCD and CMP at Dr [name] office.  Pt son is to call back with how many pills they have left at this time.  Will call in new prescription of Eliquis 2.5 mg twice a day to [facility]. Called and spoke with Ms Devaux afterwards.  She said she was doing well. I reviewed the questionnaires with the patient. I explained to her that we would also check her blood work in one month, she understood.  I thanked her for participation in the study.    [id number]  MOBILITY:    I HAVE NO PROBLEMS WALKING []   I HAVE SLIGHT PROBLEMS WALKING []   I HAVE MODERATE PROBLEMS WALKING [x]   I HAVE SEVERE PROBLEMS WALKING []   I AM UNABLE TO WALK  []     SELF-CARE:   I HAVE NO PROBLEMS WASING OR DRESSING MYSELF  []   I HAVE SLIGHT PROBLEMS WASHING OR DRESSING MYSELF  []   I HAVE MODERATE PROBLEMS WASHING OR DRESSING MYSELF [x]   I HAVE SEVERE PROBLEMS WASHING OR DRESSING MYSELF  []   I HAVE SEVERE PROBLEMS WASHING OR DRESSING MYSELF  []   I AM UNABLE TO WASH OR DRESS MYSELF []     USUAL ACTIVITIES: (E.G. WORK/STUDY/HOUSEWORK/FAMILY OR LEISURE ACTIVITIES.    I HAVE NO PROBLEMS DOING MY USUAL ACTIVITIES []   I HAVE SLIGHT PROBLEMS DOING MY USUAL ACTIVITIES [x]   I HAVE MODERATE PROBLEMS DOING MY USUAL ACTIVIITIES []   I HAVE SEVERE PROBLEMS DOING MY USUAL ACTIVITIES []   I AM UNABLE TO DO MY USUAL ACTIVITIES []     PAIN /DISCOMFORT   I HAVE NO PAIN OR DISCOMFORT [x]   I HAVE SLIGHT PAIN OR DISCOMFORT []   I HAVE MODERATE PAIN OR DISCOMFORT []   I HAVE SEVERE PAIN OR DISCOMFORT []   I HAVE EXTREME PAIN OR DISCOMFORT []     ANXIETY/DEPRESSION   I AM NOT ANXIOUS OR DEPRESSED [x]   I AM SLIGHTLY ANXIOUS  OR DEPRESSED []   I AM MODERATELY ANXIOUS OR DREPRESSED []   I AM SEVERELY ANXIOUS OR DEPRESSED []   I AM EXTREMELY ANXIOUS OR DEPRESSED []     SCALE OF 0-100 HOW WOULD YOU RATE TODAY?  0 IS THE WORSE AND 100 IS THE BEST HEALTH YOU CAN IMAGINE: 70    Current Outpatient Medications:  .  acetaminophen (TYLENOL) 325 MG tablet, Take 2 tablets (650 mg total) by mouth every 6 (six) hours as needed for mild pain., Disp: 30 tablet, Rfl: 0 .  atorvastatin (LIPITOR) 10 MG tablet, Take 10 mg daily by mouth., Disp: , Rfl:  .  calcium-vitamin D 250-100 MG-UNIT tablet, Take 1 tablet by mouth daily., Disp: , Rfl:  .  edoxaban (SAVAYSA) 30 MG TABS tablet, Take 1 tablet (30 mg total) by mouth daily., Disp: 60 tablet, Rfl: 5 .  ferrous sulfate 325 (65 FE) MG tablet, Take 1 tablet (325 mg total) by mouth every morning., Disp: 30 tablet, Rfl: 0 .  methimazole (TAPAZOLE) 5 MG tablet, Take 7.5 mg by mouth daily., Disp: , Rfl:  .  Multiple Vitamins-Minerals (OCUVITE PRESERVISION PO), Take 1 capsule by mouth daily. , Disp: , Rfl:  .  pantoprazole (PROTONIX) 40 MG tablet, Take 1  tablet (40 mg total) by mouth daily at 6 (six) AM., Disp: 30 tablet, Rfl: 0 .  vitamin B-12 (CYANOCOBALAMIN) 100 MCG tablet, Take 100 mcg by mouth daily., Disp: , Rfl:  .  VITAMIN D PO, Take 1 tablet by mouth daily., Disp: , Rfl:

## 2018-11-23 DIAGNOSIS — Z48815 Encounter for surgical aftercare following surgery on the digestive system: Secondary | ICD-10-CM | POA: Diagnosis not present

## 2018-11-23 DIAGNOSIS — E1122 Type 2 diabetes mellitus with diabetic chronic kidney disease: Secondary | ICD-10-CM | POA: Diagnosis not present

## 2018-11-23 DIAGNOSIS — K8051 Calculus of bile duct without cholangitis or cholecystitis with obstruction: Secondary | ICD-10-CM | POA: Diagnosis not present

## 2018-11-23 DIAGNOSIS — I5032 Chronic diastolic (congestive) heart failure: Secondary | ICD-10-CM | POA: Diagnosis not present

## 2018-11-23 DIAGNOSIS — I130 Hypertensive heart and chronic kidney disease with heart failure and stage 1 through stage 4 chronic kidney disease, or unspecified chronic kidney disease: Secondary | ICD-10-CM | POA: Diagnosis not present

## 2018-11-23 DIAGNOSIS — N183 Chronic kidney disease, stage 3 (moderate): Secondary | ICD-10-CM | POA: Diagnosis not present

## 2018-11-23 MED ORDER — APIXABAN 2.5 MG PO TABS: 2.5000 mg | ORAL_TABLET | Freq: Two times a day (BID) | ORAL | 3 refills | Status: DC

## 2018-11-24 DIAGNOSIS — N183 Chronic kidney disease, stage 3 (moderate): Secondary | ICD-10-CM | POA: Diagnosis not present

## 2018-11-24 DIAGNOSIS — K8051 Calculus of bile duct without cholangitis or cholecystitis with obstruction: Secondary | ICD-10-CM | POA: Diagnosis not present

## 2018-11-24 DIAGNOSIS — I5032 Chronic diastolic (congestive) heart failure: Secondary | ICD-10-CM | POA: Diagnosis not present

## 2018-11-24 DIAGNOSIS — I130 Hypertensive heart and chronic kidney disease with heart failure and stage 1 through stage 4 chronic kidney disease, or unspecified chronic kidney disease: Secondary | ICD-10-CM | POA: Diagnosis not present

## 2018-11-24 DIAGNOSIS — E1122 Type 2 diabetes mellitus with diabetic chronic kidney disease: Secondary | ICD-10-CM | POA: Diagnosis not present

## 2018-11-24 DIAGNOSIS — Z48815 Encounter for surgical aftercare following surgery on the digestive system: Secondary | ICD-10-CM | POA: Diagnosis not present

## 2018-11-24 NOTE — Telephone Encounter (Signed)
pt family and patient aware of change and new med

## 2018-11-25 DIAGNOSIS — E1122 Type 2 diabetes mellitus with diabetic chronic kidney disease: Secondary | ICD-10-CM | POA: Diagnosis not present

## 2018-11-25 DIAGNOSIS — K5731 Diverticulosis of large intestine without perforation or abscess with bleeding: Secondary | ICD-10-CM | POA: Diagnosis not present

## 2018-11-25 DIAGNOSIS — D62 Acute posthemorrhagic anemia: Secondary | ICD-10-CM | POA: Diagnosis not present

## 2018-11-25 DIAGNOSIS — I130 Hypertensive heart and chronic kidney disease with heart failure and stage 1 through stage 4 chronic kidney disease, or unspecified chronic kidney disease: Secondary | ICD-10-CM | POA: Diagnosis not present

## 2018-11-25 DIAGNOSIS — K8051 Calculus of bile duct without cholangitis or cholecystitis with obstruction: Secondary | ICD-10-CM | POA: Diagnosis not present

## 2018-11-25 DIAGNOSIS — I5032 Chronic diastolic (congestive) heart failure: Secondary | ICD-10-CM | POA: Diagnosis not present

## 2018-11-25 DIAGNOSIS — N183 Chronic kidney disease, stage 3 (moderate): Secondary | ICD-10-CM | POA: Diagnosis not present

## 2018-11-25 DIAGNOSIS — Z48815 Encounter for surgical aftercare following surgery on the digestive system: Secondary | ICD-10-CM | POA: Diagnosis not present

## 2018-11-26 DIAGNOSIS — E559 Vitamin D deficiency, unspecified: Secondary | ICD-10-CM | POA: Diagnosis not present

## 2018-11-26 DIAGNOSIS — K5731 Diverticulosis of large intestine without perforation or abscess with bleeding: Secondary | ICD-10-CM | POA: Diagnosis not present

## 2018-11-26 DIAGNOSIS — H9193 Unspecified hearing loss, bilateral: Secondary | ICD-10-CM | POA: Diagnosis not present

## 2018-11-26 DIAGNOSIS — K8051 Calculus of bile duct without cholangitis or cholecystitis with obstruction: Secondary | ICD-10-CM | POA: Diagnosis not present

## 2018-11-26 DIAGNOSIS — I052 Rheumatic mitral stenosis with insufficiency: Secondary | ICD-10-CM | POA: Diagnosis not present

## 2018-11-26 DIAGNOSIS — E785 Hyperlipidemia, unspecified: Secondary | ICD-10-CM | POA: Diagnosis not present

## 2018-11-26 DIAGNOSIS — I130 Hypertensive heart and chronic kidney disease with heart failure and stage 1 through stage 4 chronic kidney disease, or unspecified chronic kidney disease: Secondary | ICD-10-CM | POA: Diagnosis not present

## 2018-11-26 DIAGNOSIS — D631 Anemia in chronic kidney disease: Secondary | ICD-10-CM | POA: Diagnosis not present

## 2018-11-26 DIAGNOSIS — M810 Age-related osteoporosis without current pathological fracture: Secondary | ICD-10-CM | POA: Diagnosis not present

## 2018-11-26 DIAGNOSIS — N183 Chronic kidney disease, stage 3 (moderate): Secondary | ICD-10-CM | POA: Diagnosis not present

## 2018-11-26 DIAGNOSIS — Z48815 Encounter for surgical aftercare following surgery on the digestive system: Secondary | ICD-10-CM | POA: Diagnosis not present

## 2018-11-26 DIAGNOSIS — J849 Interstitial pulmonary disease, unspecified: Secondary | ICD-10-CM | POA: Diagnosis not present

## 2018-11-26 DIAGNOSIS — I495 Sick sinus syndrome: Secondary | ICD-10-CM | POA: Diagnosis not present

## 2018-11-26 DIAGNOSIS — I5033 Acute on chronic diastolic (congestive) heart failure: Secondary | ICD-10-CM | POA: Diagnosis not present

## 2018-11-26 DIAGNOSIS — E1122 Type 2 diabetes mellitus with diabetic chronic kidney disease: Secondary | ICD-10-CM | POA: Diagnosis not present

## 2018-11-26 DIAGNOSIS — Z7982 Long term (current) use of aspirin: Secondary | ICD-10-CM | POA: Diagnosis not present

## 2018-11-26 DIAGNOSIS — I480 Paroxysmal atrial fibrillation: Secondary | ICD-10-CM | POA: Diagnosis not present

## 2018-11-26 DIAGNOSIS — M8580 Other specified disorders of bone density and structure, unspecified site: Secondary | ICD-10-CM | POA: Diagnosis not present

## 2018-11-26 DIAGNOSIS — Z952 Presence of prosthetic heart valve: Secondary | ICD-10-CM | POA: Diagnosis not present

## 2018-11-26 DIAGNOSIS — K219 Gastro-esophageal reflux disease without esophagitis: Secondary | ICD-10-CM | POA: Diagnosis not present

## 2018-11-26 DIAGNOSIS — I447 Left bundle-branch block, unspecified: Secondary | ICD-10-CM | POA: Diagnosis not present

## 2018-11-26 DIAGNOSIS — R001 Bradycardia, unspecified: Secondary | ICD-10-CM | POA: Diagnosis not present

## 2018-11-26 DIAGNOSIS — D62 Acute posthemorrhagic anemia: Secondary | ICD-10-CM | POA: Diagnosis not present

## 2018-11-26 DIAGNOSIS — I2510 Atherosclerotic heart disease of native coronary artery without angina pectoris: Secondary | ICD-10-CM | POA: Diagnosis not present

## 2018-11-26 DIAGNOSIS — I440 Atrioventricular block, first degree: Secondary | ICD-10-CM | POA: Diagnosis not present

## 2018-11-29 DIAGNOSIS — Z48815 Encounter for surgical aftercare following surgery on the digestive system: Secondary | ICD-10-CM | POA: Diagnosis not present

## 2018-11-29 DIAGNOSIS — N183 Chronic kidney disease, stage 3 (moderate): Secondary | ICD-10-CM | POA: Diagnosis not present

## 2018-11-29 DIAGNOSIS — I130 Hypertensive heart and chronic kidney disease with heart failure and stage 1 through stage 4 chronic kidney disease, or unspecified chronic kidney disease: Secondary | ICD-10-CM | POA: Diagnosis not present

## 2018-11-29 DIAGNOSIS — E1122 Type 2 diabetes mellitus with diabetic chronic kidney disease: Secondary | ICD-10-CM | POA: Diagnosis not present

## 2018-11-29 DIAGNOSIS — K8051 Calculus of bile duct without cholangitis or cholecystitis with obstruction: Secondary | ICD-10-CM | POA: Diagnosis not present

## 2018-11-29 DIAGNOSIS — I5033 Acute on chronic diastolic (congestive) heart failure: Secondary | ICD-10-CM | POA: Diagnosis not present

## 2018-12-02 ENCOUNTER — Other Ambulatory Visit: Payer: Self-pay

## 2018-12-02 ENCOUNTER — Ambulatory Visit (INDEPENDENT_AMBULATORY_CARE_PROVIDER_SITE_OTHER): Payer: Medicare Other | Admitting: Gastroenterology

## 2018-12-02 ENCOUNTER — Encounter: Payer: Self-pay | Admitting: Gastroenterology

## 2018-12-02 ENCOUNTER — Other Ambulatory Visit (INDEPENDENT_AMBULATORY_CARE_PROVIDER_SITE_OTHER): Payer: Medicare Other

## 2018-12-02 VITALS — BP 110/62 | HR 73 | Ht 60.0 in | Wt 138.0 lb

## 2018-12-02 DIAGNOSIS — Z9049 Acquired absence of other specified parts of digestive tract: Secondary | ICD-10-CM

## 2018-12-02 DIAGNOSIS — K269 Duodenal ulcer, unspecified as acute or chronic, without hemorrhage or perforation: Secondary | ICD-10-CM

## 2018-12-02 DIAGNOSIS — Z9889 Other specified postprocedural states: Secondary | ICD-10-CM

## 2018-12-02 DIAGNOSIS — D509 Iron deficiency anemia, unspecified: Secondary | ICD-10-CM

## 2018-12-02 DIAGNOSIS — Z48815 Encounter for surgical aftercare following surgery on the digestive system: Secondary | ICD-10-CM | POA: Diagnosis not present

## 2018-12-02 DIAGNOSIS — N183 Chronic kidney disease, stage 3 (moderate): Secondary | ICD-10-CM | POA: Diagnosis not present

## 2018-12-02 DIAGNOSIS — K8051 Calculus of bile duct without cholangitis or cholecystitis with obstruction: Secondary | ICD-10-CM | POA: Diagnosis not present

## 2018-12-02 DIAGNOSIS — K259 Gastric ulcer, unspecified as acute or chronic, without hemorrhage or perforation: Secondary | ICD-10-CM | POA: Diagnosis not present

## 2018-12-02 DIAGNOSIS — I5033 Acute on chronic diastolic (congestive) heart failure: Secondary | ICD-10-CM | POA: Diagnosis not present

## 2018-12-02 DIAGNOSIS — D649 Anemia, unspecified: Secondary | ICD-10-CM

## 2018-12-02 DIAGNOSIS — I130 Hypertensive heart and chronic kidney disease with heart failure and stage 1 through stage 4 chronic kidney disease, or unspecified chronic kidney disease: Secondary | ICD-10-CM | POA: Diagnosis not present

## 2018-12-02 DIAGNOSIS — E1122 Type 2 diabetes mellitus with diabetic chronic kidney disease: Secondary | ICD-10-CM | POA: Diagnosis not present

## 2018-12-02 LAB — COMPREHENSIVE METABOLIC PANEL
ALT: 9 U/L (ref 0–35)
AST: 12 U/L (ref 0–37)
Albumin: 3.5 g/dL (ref 3.5–5.2)
Alkaline Phosphatase: 82 U/L (ref 39–117)
BUN: 17 mg/dL (ref 6–23)
CO2: 25 mEq/L (ref 19–32)
Calcium: 8.5 mg/dL (ref 8.4–10.5)
Chloride: 103 mEq/L (ref 96–112)
Creatinine, Ser: 1.3 mg/dL — ABNORMAL HIGH (ref 0.40–1.20)
GFR: 38.02 mL/min — ABNORMAL LOW (ref >60.00)
Glucose, Bld: 245 mg/dL — ABNORMAL HIGH (ref 70–99)
Potassium: 4.3 mEq/L (ref 3.5–5.1)
Sodium: 136 mEq/L (ref 135–145)
Total Bilirubin: 0.7 mg/dL (ref 0.2–1.2)
Total Protein: 6.2 g/dL (ref 6.0–8.3)

## 2018-12-02 LAB — H. PYLORI ANTIBODY, IGG: H Pylori IgG: NEGATIVE

## 2018-12-02 LAB — IBC + FERRITIN
Ferritin: 35 ng/mL (ref 10.0–291.0)
Iron: 26 ug/dL — ABNORMAL LOW (ref 42–145)
Saturation Ratios: 7.8 % — ABNORMAL LOW (ref 20.0–50.0)
Transferrin: 239 mg/dL (ref 212.0–360.0)

## 2018-12-02 LAB — CBC
HCT: 32.7 % — ABNORMAL LOW (ref 36.0–46.0)
Hemoglobin: 10.6 g/dL — ABNORMAL LOW (ref 12.0–15.0)
MCHC: 32.4 g/dL (ref 30.0–36.0)
MCV: 90.1 fl (ref 78.0–100.0)
Platelets: 381 10*3/uL (ref 150.0–400.0)
RBC: 3.63 Mil/uL — ABNORMAL LOW (ref 3.87–5.11)
RDW: 18.9 % — ABNORMAL HIGH (ref 11.5–15.5)
WBC: 8 10*3/uL (ref 4.0–10.5)

## 2018-12-02 MED ORDER — FERROUS SULFATE 325 (65 FE) MG PO TABS: 325.0000 mg | ORAL_TABLET | Freq: Every morning | ORAL | 3 refills | Status: DC

## 2018-12-02 MED ORDER — PANTOPRAZOLE SODIUM 40 MG PO TBEC: 40.0000 mg | DELAYED_RELEASE_TABLET | Freq: Every day | ORAL | 3 refills | Status: DC

## 2018-12-02 NOTE — Patient Instructions (Signed)
If you are age 83 or older, your body mass index should be between 23-30. Your Body mass index is 26.95 kg/m. If this is out of the aforementioned range listed, please consider follow up with your Primary Care Provider.  If you are age 82 or younger, your body mass index should be between 19-25. Your Body mass index is 26.95 kg/m. If this is out of the aformentioned range listed, please consider follow up with your Primary Care Provider.   Your provider has requested that you go to the basement level for lab work before leaving today. Press "B" on the elevator. The lab is located at the first door on the left as you exit the elevator.  Refill of Iron and Protonix was sent to your pharmacy.  Thank you for choosing me and [facility].  Dr. Rush Landmark

## 2018-12-02 NOTE — Progress Notes (Signed)
[place] VISIT   Primary Care Provider Jonathon Jordan, MD [address] [place] [address] [phone]  Patient Profile: Alexa Mcdonald is a 85 y.o. female with a pmh significant for chronic renal insufficiency, diverticulosis, diabetes, hypertension, GERD, osteoporosis, pAFib(on AC), status post TAVR, status post cholecystectomy (with ERCP and sphincterotomy for CBD stones), PUD.  The patient presents to the [facility] for an evaluation and management of problem(s) noted below:  Problem List 1. Iron deficiency anemia, unspecified iron deficiency anemia type   2. Duodenal ulcer   3. Gastric erosion, unspecified chronicity   4. History of cholecystectomy   5. History of ERCP     History of Present Illness Please see initial consultation note from Dr. Lyndel Safe as well as consultation note in the hospital upon rehospitalization for full details of HPI.  In brief, this is a patient presented with symptomatic cholelithiasis and cholecystitis.  She underwent cross-sectional imaging showing evidence of choledocholithiasis.  She underwent an ERCP successfully with stone extraction via sphincterotomy/sphincteroplasty.  She subsequently underwent a cholecystectomy.  She was able to be discharged.   She returned in the setting of anemia and weakness as well as change in the color of her stools after she was following up with her surgeon post op.  She was found to be iron deficient and anemic.  We proceeded with scheduling an upper endoscopy.  EGD with side-viewing endoscopy was performed showing recently bleeding gastric erosions as well as multiple ulcerations in the duodenum and a patent sphincterotomy/sphincteroplasty site with mild erosion.  No evidence of active bleeding was found.  The patient was placed on high-dose PPI therapy and gastric biopsies were obtained showed no evidence of H. pylori infection.  The patient was able to  be discharged.  She has been able to reinitiate anticoagulation as per her cardiologist.  She has maintained her iron once daily.  Today, the patient presents with her daughter and states that she is doing well.  She is not noticing any recurrence of the significant dark/tarry stool but her stool is darker.  She is having no abdominal pain.  GI Review of Systems Positive as above Negative for pyrosis, dysphagia, odynophagia, change in bowel habits, melena, hematochezia  Review of Systems General: Denies fevers/chills HEENT: Denies oral lesions Cardiovascular: Denies chest pain Pulmonary: Denies shortness of breath Gastroenterological: See HPI Genitourinary: Denies darkened urine Hematological: Positive for easy bruising/bleeding as she is on anticoagulation Dermatological: Denies jaundice Psychological: Mood is stable   Medications Current Outpatient Medications  Medication Sig Dispense Refill   acetaminophen (TYLENOL) 325 MG tablet Take 2 tablets (650 mg total) by mouth every 6 (six) hours as needed for mild pain. 30 tablet 0   atorvastatin (LIPITOR) 10 MG tablet Take 10 mg daily by mouth.     calcium-vitamin D 250-100 MG-UNIT tablet Take 1 tablet by mouth daily.     edoxaban (SAVAYSA) 30 MG TABS tablet Take 1 tablet (30 mg total) by mouth daily. 60 tablet 5   ferrous sulfate 325 (65 FE) MG tablet Take 1 tablet (325 mg total) by mouth every morning. 30 tablet 3   methimazole (TAPAZOLE) 5 MG tablet Take 7.5 mg by mouth daily.     Multiple Vitamins-Minerals (OCUVITE PRESERVISION PO) Take 1 capsule by mouth daily.      pantoprazole (PROTONIX) 40 MG tablet Take 1 tablet (40 mg total) by mouth daily at 6 (six) AM. 30 tablet 3   vitamin B-12 (CYANOCOBALAMIN)  100 MCG tablet Take 100 mcg by mouth daily.     VITAMIN D PO Take 1 tablet by mouth daily.     apixaban (ELIQUIS) 2.5 MG TABS tablet Take 1 tablet (2.5 mg total) by mouth 2 (two) times daily. (Patient not taking: Reported on  12/02/2018) 60 tablet 3   No current facility-administered medications for this visit.     Allergies Allergies  Allergen Reactions   Amoxicillin Other (See Comments)    tongue felt bruised Did it involve swelling of the face/tongue/throat, SOB, or low BP? No Did it involve sudden or severe rash/hives, skin peeling, or any reaction on the inside of your mouth or nose? No Did you need to seek medical attention at a hospital or doctor's office? No When did it last happen?childhood If all above answers are NO, may proceed with cephalosporin use.     Celebrex [Celecoxib] Other (See Comments)    Mouth sores    Histories Past Medical History:  Diagnosis Date   1st degree AV block    Bradycardia    Chronic diastolic heart failure ([place]) 02/07/2016   CKD (chronic kidney disease), stage III (HCC)    Colon, diverticulosis    Diabetes mellitus    diet controlled   Essential hypertension    GERD (gastroesophageal reflux disease)    Hematochezia    HOH (hard of hearing)    wears bilateral hearing aids   Hx of orthostatic hypotension    Interstitial lung disease (HCC)    LBBB (left bundle branch block)    Long term current use of anticoagulant therapy    Mediastinal goiter     recurrent s/p resection 1988   Mild CAD    Mitral regurgitation    Osteopenia    Osteoporosis    Paroxysmal atrial fibrillation ([place])    a. CHA2DS2VASc = 5-->coumadin;  b. previously on amio->d/c'd 11/2014 secondary to concern for possible amio lung.   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   23 mm Edwards Sapien 3 transcatheter heart valve placed via percutaneous right transfemoral approach   Severe aortic stenosis    Stroke ([place])    Tachycardia-bradycardia ([place])    Vitamin D deficiency    Past Surgical History:  Procedure Laterality Date   BILIARY DILATION  10/24/2018   Procedure: BILIARY DILATION;  Surgeon: Jackquline Denmark, MD;   Location: [facility] ENDOSCOPY;  Service: Endoscopy;;   BIOPSY  11/11/2018   Procedure: BIOPSY;  Surgeon: Irving Copas., MD;  Location: [facility] ENDOSCOPY;  Service: Gastroenterology;;   CARDIAC CATHETERIZATION N/A 10/31/2015   Procedure: Right/Left Heart Cath and Coronary Angiography;  Surgeon: [name], MD;  Location: [facility];  Service: Cardiovascular;  Laterality: N/A;   CARPAL TUNNEL RELEASE Bilateral    CATARACT EXTRACTION Bilateral    CESAREAN SECTION     x3   CHOLECYSTECTOMY N/A 10/25/2018   Procedure: LAPAROSCOPIC CHOLECYSTECTOMY WITH INTRAOPERATIVE CHOLANGIOGRAM;  Surgeon: Kieth Brightly Arta Bruce, MD;  Location: [facility] ORS;  Service: General;  Laterality: N/A;   ERCP N/A 10/24/2018   Procedure: ENDOSCOPIC RETROGRADE CHOLANGIOPANCREATOGRAPHY (ERCP);  Surgeon: Jackquline Denmark, MD;  Location: [facility];  Service: Endoscopy;  Laterality: N/A;   ESOPHAGOGASTRODUODENOSCOPY (EGD) WITH PROPOFOL N/A 11/11/2018   Procedure: ESOPHAGOGASTRODUODENOSCOPY (EGD) WITH PROPOFOL;  Surgeon: Rush Landmark Telford Nab., MD;  Location: [facility];  Service: Gastroenterology;  Laterality: N/A;   mediastinal removal of a goiter     MRI  08/18/07   head (diabetes heart study at [facility])  REMOVAL OF STONES  10/24/2018   Procedure: REMOVAL OF STONES;  Surgeon: Jackquline Denmark, MD;  Location: [facility] ENDOS[facility];  Service: Endoscopy;;   SPHINCTEROTOMY  10/24/2018   Procedure: Joan Mayans;  Surgeon: Jackquline Denmark, MD;  Location: [facility];  Service: Endoscopy;;   TEE WITHOUT CARDIOVERSION N/A 11/27/2015   Procedure: TRANSESOPHAGEAL ECHOCARDIOGRAM (TEE);  Surgeon: [name], MD;  Location: [place];  Service: Open Heart Surgery;  Laterality: N/A;   TRANSCATHETER AORTIC VALVE REPLACEMENT, TRANSFEMORAL N/A 11/27/2015   Procedure: TRANSCATHETER AORTIC VALVE REPLACEMENT, TRANSFEMORAL;  Surgeon: [name], MD;  Location: [place] [place];  Service: Open Heart Surgery;  Laterality: N/A;   Social History    Socioeconomic History   Marital status: Widowed    Spouse name: Not on file   Number of children: 3   Years of education: Not on file   Highest education level: Not on file  Occupational History   Occupation: retire  Social Designer, fashion/clothing strain: Not on file   Food insecurity:    Worry: Not on file    Inability: Not on file   Transportation needs:    Medical: Not on file    Non-medical: Not on file  Tobacco Use   Smoking status: Never Smoker   Smokeless tobacco: Never Used  Substance and Sexual Activity   Alcohol use: No    Alcohol/week: 0.0 standard drinks   Drug use: No   Sexual activity: Not on file  Lifestyle   Physical activity:    Days per week: Not on file    Minutes per session: Not on file   Stress: Not on file  Relationships   Social connections:    Talks on phone: Not on file    Gets together: Not on file    Attends religious service: Not on file    Active member of club or organization: Not on file    Attends meetings of clubs or organizations: Not on file    Relationship status: Not on file   Intimate partner violence:    Fear of current or ex partner: Not on file    Emotionally abused: Not on file    Physically abused: Not on file    Forced sexual activity: Not on file  Other Topics Concern   Not on file  Social History Narrative   Lives in [place].  Widowed.   Family History  Problem Relation Age of Onset   Diabetes Mother    Cancer - Prostate Father    Pulmonary embolism Father    Hypertension Sister    Diabetes Sister    Hypertension Brother    Arrhythmia Brother        afib   CAD Brother    Pulmonary fibrosis Brother    Colon cancer Neg Hx    Esophageal cancer Neg Hx    Inflammatory bowel disease Neg Hx    Liver disease Neg Hx    Pancreatic cancer Neg Hx    Stomach cancer Neg Hx    Rectal cancer Neg Hx    I have reviewed her medical, social, and family history in detail and  updated the electronic medical record as necessary.    PHYSICAL EXAMINATION  BP 110/62    Pulse 73    Ht 5' (1.524 m)    Wt 138 lb (62.6 kg)    SpO2 96%    BMI 26.95 kg/m  Wt Readings from Last 3 Encounters:  12/02/18 138 lb (62.6 kg)  11/13/18 131 lb (  59.4 kg)  10/26/18 143 lb 11.8 oz (65.2 kg)  GEN: NAD, appears younger than stated age, doesn't appear chronically ill, accompanied by daughter PSYCH: Cooperative, without pressured speech, she is very hard of hearing but appropriate EYE: Conjunctivae pink, sclerae anicteric ENT: MMM, without oral ulcers CV: RR without R/Gs  RESP: No adventitious breath sounds present GI: NABS, soft, NT/ND, without rebound or guarding MSK/EXT: Trace bilateral lower extremity edema SKIN: No jaundice NEURO:  Alert & Oriented x 3, no focal defects   REVIEW OF DATA  I reviewed the following data at the time of this encounter:  GI Procedures and Studies  April 2020 EGD - No gross lesions in esophagus. - Recently bleeding erosive gastropathy.Biopsied for HP. - Non-bleeding duodenal ulcers with a clean ulcer base ([place] Class III). - Patent sphincterotomy, characterized by healthy tissue and very minimal ulceration. - Normal second portion of the duodenum.  March 2020 ERCP -Choledocholithiasis status post biliary sphincterotomy, sphincteroplasty and stone extraction. -Cholelithiasis.  Laboratory Studies  Reviewed in epic  Imaging Studies  March 2020 CT abdomen pelvis without contrast IMPRESSION: 1. Dilation of the gallbladder and common bile duct with high density material within the distal common bile duct which is suspicious for obstructive choledocholithiasis. 2. Diffuse colonic diverticulosis without evidence of diverticulitis.   ASSESSMENT  Ms. Oconnell is a 85 y.o. female with a pmh significant for chronic renal insufficiency, diverticulosis, diabetes, hypertension, GERD, osteoporosis, pAFib(on AC), status post TAVR, status post  cholecystectomy (with ERCP and sphincterotomy for CBD stones), PUD.  The patient is seen today for evaluation and management of:  1. Iron deficiency anemia, unspecified iron deficiency anemia type   2. Duodenal ulcer   3. Gastric erosion, unspecified chronicity   4. History of cholecystectomy   5. History of ERCP    The patient is hemodynamically and clinically stable today in clinic.  She seems to have done well post her upper endoscopy with findings of recent bleeding but no active extravasation.  She has been restarted on anticoagulation.  She not noting any evidence of dark or tarry stools and her energy levels are good.  I think at this point in time we should maintain her iron supplementation and recheck her labs.  Based on what her labs look like we will consider the role of IV iron infusions as well as increasing her iron intake.  Depending on what her blood count looks like we may need to consider earlier repeat endoscopy versus considering repeat endoscopy in approximately 2 to 3 months.  Let see what her labs look like today.  Otherwise no other significant issues.  Would not plan to perform a colonoscopy unless we were to see a persistent iron deficiency anemia and we ruled out that ulcer disease has been healed completely.  All patient questions were answered, to the best of my ability, and the patient agrees to the aforementioned plan of action with follow-up as indicated.   PLAN  Laboratories as outlined below H. pylori serology and if positive will strongly consider treatment Continue oral iron once daily Follow-up in approximately 6 to 8 weeks based on iron indices to evaluate follow-up endoscopy needs Dependent on labs will dictate need for follow-up endoscopy versus clinical monitoring   Orders Placed This Encounter  Procedures   CBC   Comp Met (CMET)   IBC + Ferritin   H. pylori antibody, IgG    New Prescriptions   No medications on file   Modified Medications    Modified  Medication Previous Medication   FERROUS SULFATE 325 (65 FE) MG TABLET ferrous sulfate 325 (65 FE) MG tablet      Take 1 tablet (325 mg total) by mouth every morning.    Take 1 tablet (325 mg total) by mouth every morning.   PANTOPRAZOLE (PROTONIX) 40 MG TABLET pantoprazole (PROTONIX) 40 MG tablet      Take 1 tablet (40 mg total) by mouth daily at 6 (six) AM.    Take 1 tablet (40 mg total) by mouth daily at 6 (six) AM.    Planned Follow Up No follow-ups on file.   Justice Britain, MD [facility] Advanced Endoscopy Office # [phone]

## 2018-12-02 NOTE — Telephone Encounter (Signed)
Spoke with the patient's son, who states the patient has about 90 days of Savaysa left before she will switch to Eliquis.  He states Kim instructed him to call when the patient runs out and started Eliquis. He understands if Maudie Mercury does not schedule lab work, to Designer, fashion/clothing and BMET and CBC will be scheduled 1 mo past Eliquis start. He was grateful for assistance.

## 2018-12-05 ENCOUNTER — Encounter: Payer: Self-pay | Admitting: Gastroenterology

## 2018-12-05 DIAGNOSIS — Z9889 Other specified postprocedural states: Secondary | ICD-10-CM | POA: Insufficient documentation

## 2018-12-05 DIAGNOSIS — D509 Iron deficiency anemia, unspecified: Secondary | ICD-10-CM | POA: Insufficient documentation

## 2018-12-05 DIAGNOSIS — Z9049 Acquired absence of other specified parts of digestive tract: Secondary | ICD-10-CM | POA: Insufficient documentation

## 2018-12-05 DIAGNOSIS — K269 Duodenal ulcer, unspecified as acute or chronic, without hemorrhage or perforation: Secondary | ICD-10-CM | POA: Insufficient documentation

## 2018-12-05 DIAGNOSIS — K259 Gastric ulcer, unspecified as acute or chronic, without hemorrhage or perforation: Secondary | ICD-10-CM | POA: Insufficient documentation

## 2018-12-06 ENCOUNTER — Other Ambulatory Visit: Payer: Self-pay

## 2018-12-06 DIAGNOSIS — D509 Iron deficiency anemia, unspecified: Secondary | ICD-10-CM

## 2018-12-07 DIAGNOSIS — I890 Lymphedema, not elsewhere classified: Secondary | ICD-10-CM | POA: Diagnosis not present

## 2018-12-07 DIAGNOSIS — J849 Interstitial pulmonary disease, unspecified: Secondary | ICD-10-CM | POA: Diagnosis not present

## 2018-12-07 DIAGNOSIS — Z48815 Encounter for surgical aftercare following surgery on the digestive system: Secondary | ICD-10-CM | POA: Diagnosis not present

## 2018-12-07 DIAGNOSIS — I5033 Acute on chronic diastolic (congestive) heart failure: Secondary | ICD-10-CM | POA: Diagnosis not present

## 2018-12-07 DIAGNOSIS — I2510 Atherosclerotic heart disease of native coronary artery without angina pectoris: Secondary | ICD-10-CM | POA: Diagnosis not present

## 2018-12-07 DIAGNOSIS — K8051 Calculus of bile duct without cholangitis or cholecystitis with obstruction: Secondary | ICD-10-CM | POA: Diagnosis not present

## 2018-12-07 DIAGNOSIS — D62 Acute posthemorrhagic anemia: Secondary | ICD-10-CM | POA: Diagnosis not present

## 2018-12-07 DIAGNOSIS — N183 Chronic kidney disease, stage 3 (moderate): Secondary | ICD-10-CM | POA: Diagnosis not present

## 2018-12-07 DIAGNOSIS — E1122 Type 2 diabetes mellitus with diabetic chronic kidney disease: Secondary | ICD-10-CM | POA: Diagnosis not present

## 2018-12-07 DIAGNOSIS — I130 Hypertensive heart and chronic kidney disease with heart failure and stage 1 through stage 4 chronic kidney disease, or unspecified chronic kidney disease: Secondary | ICD-10-CM | POA: Diagnosis not present

## 2018-12-08 DIAGNOSIS — N183 Chronic kidney disease, stage 3 (moderate): Secondary | ICD-10-CM | POA: Diagnosis not present

## 2018-12-08 DIAGNOSIS — I130 Hypertensive heart and chronic kidney disease with heart failure and stage 1 through stage 4 chronic kidney disease, or unspecified chronic kidney disease: Secondary | ICD-10-CM | POA: Diagnosis not present

## 2018-12-08 DIAGNOSIS — K8051 Calculus of bile duct without cholangitis or cholecystitis with obstruction: Secondary | ICD-10-CM | POA: Diagnosis not present

## 2018-12-08 DIAGNOSIS — I5033 Acute on chronic diastolic (congestive) heart failure: Secondary | ICD-10-CM | POA: Diagnosis not present

## 2018-12-08 DIAGNOSIS — Z48815 Encounter for surgical aftercare following surgery on the digestive system: Secondary | ICD-10-CM | POA: Diagnosis not present

## 2018-12-08 DIAGNOSIS — E1122 Type 2 diabetes mellitus with diabetic chronic kidney disease: Secondary | ICD-10-CM | POA: Diagnosis not present

## 2018-12-09 ENCOUNTER — Encounter (HOSPITAL_COMMUNITY): Payer: Medicare Other

## 2018-12-13 DIAGNOSIS — K8051 Calculus of bile duct without cholangitis or cholecystitis with obstruction: Secondary | ICD-10-CM | POA: Diagnosis not present

## 2018-12-13 DIAGNOSIS — I5033 Acute on chronic diastolic (congestive) heart failure: Secondary | ICD-10-CM | POA: Diagnosis not present

## 2018-12-13 DIAGNOSIS — I130 Hypertensive heart and chronic kidney disease with heart failure and stage 1 through stage 4 chronic kidney disease, or unspecified chronic kidney disease: Secondary | ICD-10-CM | POA: Diagnosis not present

## 2018-12-13 DIAGNOSIS — E1122 Type 2 diabetes mellitus with diabetic chronic kidney disease: Secondary | ICD-10-CM | POA: Diagnosis not present

## 2018-12-13 DIAGNOSIS — N183 Chronic kidney disease, stage 3 (moderate): Secondary | ICD-10-CM | POA: Diagnosis not present

## 2018-12-13 DIAGNOSIS — Z48815 Encounter for surgical aftercare following surgery on the digestive system: Secondary | ICD-10-CM | POA: Diagnosis not present

## 2018-12-16 ENCOUNTER — Encounter (HOSPITAL_COMMUNITY): Payer: Medicare Other

## 2018-12-16 DIAGNOSIS — I130 Hypertensive heart and chronic kidney disease with heart failure and stage 1 through stage 4 chronic kidney disease, or unspecified chronic kidney disease: Secondary | ICD-10-CM | POA: Diagnosis not present

## 2018-12-16 DIAGNOSIS — N183 Chronic kidney disease, stage 3 (moderate): Secondary | ICD-10-CM | POA: Diagnosis not present

## 2018-12-16 DIAGNOSIS — E1122 Type 2 diabetes mellitus with diabetic chronic kidney disease: Secondary | ICD-10-CM | POA: Diagnosis not present

## 2018-12-16 DIAGNOSIS — Z48815 Encounter for surgical aftercare following surgery on the digestive system: Secondary | ICD-10-CM | POA: Diagnosis not present

## 2018-12-16 DIAGNOSIS — K8051 Calculus of bile duct without cholangitis or cholecystitis with obstruction: Secondary | ICD-10-CM | POA: Diagnosis not present

## 2018-12-16 DIAGNOSIS — I5033 Acute on chronic diastolic (congestive) heart failure: Secondary | ICD-10-CM | POA: Diagnosis not present

## 2018-12-20 DIAGNOSIS — N183 Chronic kidney disease, stage 3 (moderate): Secondary | ICD-10-CM | POA: Diagnosis not present

## 2018-12-20 DIAGNOSIS — K8051 Calculus of bile duct without cholangitis or cholecystitis with obstruction: Secondary | ICD-10-CM | POA: Diagnosis not present

## 2018-12-20 DIAGNOSIS — I5033 Acute on chronic diastolic (congestive) heart failure: Secondary | ICD-10-CM | POA: Diagnosis not present

## 2018-12-20 DIAGNOSIS — E1122 Type 2 diabetes mellitus with diabetic chronic kidney disease: Secondary | ICD-10-CM | POA: Diagnosis not present

## 2018-12-20 DIAGNOSIS — I130 Hypertensive heart and chronic kidney disease with heart failure and stage 1 through stage 4 chronic kidney disease, or unspecified chronic kidney disease: Secondary | ICD-10-CM | POA: Diagnosis not present

## 2018-12-20 DIAGNOSIS — Z48815 Encounter for surgical aftercare following surgery on the digestive system: Secondary | ICD-10-CM | POA: Diagnosis not present

## 2018-12-22 DIAGNOSIS — K8051 Calculus of bile duct without cholangitis or cholecystitis with obstruction: Secondary | ICD-10-CM | POA: Diagnosis not present

## 2018-12-22 DIAGNOSIS — E1122 Type 2 diabetes mellitus with diabetic chronic kidney disease: Secondary | ICD-10-CM | POA: Diagnosis not present

## 2018-12-22 DIAGNOSIS — Z48815 Encounter for surgical aftercare following surgery on the digestive system: Secondary | ICD-10-CM | POA: Diagnosis not present

## 2018-12-22 DIAGNOSIS — I130 Hypertensive heart and chronic kidney disease with heart failure and stage 1 through stage 4 chronic kidney disease, or unspecified chronic kidney disease: Secondary | ICD-10-CM | POA: Diagnosis not present

## 2018-12-22 DIAGNOSIS — N183 Chronic kidney disease, stage 3 (moderate): Secondary | ICD-10-CM | POA: Diagnosis not present

## 2018-12-22 DIAGNOSIS — I5033 Acute on chronic diastolic (congestive) heart failure: Secondary | ICD-10-CM | POA: Diagnosis not present

## 2018-12-23 ENCOUNTER — Encounter: Payer: Self-pay | Admitting: General Surgery

## 2018-12-23 DIAGNOSIS — Z48815 Encounter for surgical aftercare following surgery on the digestive system: Secondary | ICD-10-CM | POA: Diagnosis not present

## 2018-12-23 DIAGNOSIS — I5033 Acute on chronic diastolic (congestive) heart failure: Secondary | ICD-10-CM | POA: Diagnosis not present

## 2018-12-23 DIAGNOSIS — I130 Hypertensive heart and chronic kidney disease with heart failure and stage 1 through stage 4 chronic kidney disease, or unspecified chronic kidney disease: Secondary | ICD-10-CM | POA: Diagnosis not present

## 2018-12-23 DIAGNOSIS — N183 Chronic kidney disease, stage 3 (moderate): Secondary | ICD-10-CM | POA: Diagnosis not present

## 2018-12-23 DIAGNOSIS — E1122 Type 2 diabetes mellitus with diabetic chronic kidney disease: Secondary | ICD-10-CM | POA: Diagnosis not present

## 2018-12-23 DIAGNOSIS — K8051 Calculus of bile duct without cholangitis or cholecystitis with obstruction: Secondary | ICD-10-CM | POA: Diagnosis not present

## 2018-12-23 NOTE — Progress Notes (Signed)
Tried to pre-screen the patient and she was unable to verify any information. She will call back today sometime with her medication in hand so we can go over them.

## 2018-12-28 ENCOUNTER — Other Ambulatory Visit: Payer: Self-pay

## 2018-12-28 ENCOUNTER — Ambulatory Visit: Payer: Medicare Other | Admitting: Gastroenterology

## 2018-12-28 NOTE — Progress Notes (Signed)
Left voicemail at mobile number for possible call back. Called home unable to leave message. Patient had not had labs drawn for follow-up as previous documentation had noted. Will plan to have patient return for laboratories before follow-up visit. She will be rescheduled for follow up.  Justice Britain, MD [facility] Advanced Endoscopy Office # [phone]

## 2018-12-30 ENCOUNTER — Telehealth: Payer: Self-pay

## 2018-12-30 ENCOUNTER — Telehealth: Payer: Self-pay | Admitting: *Deleted

## 2018-12-30 ENCOUNTER — Ambulatory Visit: Payer: Medicare Other | Admitting: Gastroenterology

## 2018-12-30 NOTE — Telephone Encounter (Signed)
Thank you for update. Let us go ahead and just plan to get the labs that are already future pended. If we can get those before her clinic visit at least 2 days before that would be ideal because then we can have a good telehealth visit or in person visit. Hold on IV iron for now unless the iron saturation remains significantly low. Thank you. GM

## 2018-12-30 NOTE — Telephone Encounter (Signed)
Dr Rush Landmark,  I called and sw pt's son> He states that pt was never informed that she needed IV iron. So she did not go for infusion, nor did she have labs. I have r/s patient for 01/27/2019 for telehealth visit. Pt has in-home nurses that come to patients home, but the son is not sure that they can draw labs for patient. Do you still want repeat labs, since pt did not have IV iron? Son states that pt has been doing well,  with no complaints that he knows of.

## 2018-12-30 NOTE — Telephone Encounter (Signed)
Spoke with son on phone about how many pills she had left for edoxaban for study. At our last conversation he said she had 7, but continuing to take them. He said he did pick up the eliquis at the pharmacy. We decided today would be her last dose of edoxband and tomorrow she would start Eliquis.

## 2018-12-31 ENCOUNTER — Other Ambulatory Visit: Payer: Self-pay | Admitting: *Deleted

## 2018-12-31 ENCOUNTER — Other Ambulatory Visit: Payer: Self-pay

## 2018-12-31 ENCOUNTER — Encounter: Payer: Self-pay | Admitting: *Deleted

## 2018-12-31 ENCOUNTER — Encounter: Payer: Medicare Other | Admitting: *Deleted

## 2018-12-31 DIAGNOSIS — I350 Nonrheumatic aortic (valve) stenosis: Secondary | ICD-10-CM

## 2018-12-31 DIAGNOSIS — Z006 Encounter for examination for normal comparison and control in clinical research program: Secondary | ICD-10-CM

## 2018-12-31 NOTE — Research (Signed)
Alexa Mcdonald   Went by patient home and picked up study drug, and drew her blood (CMP/CBC). Pt doing well.  Son Alexa Mcdonald aware to start eliquis today, and its a twice a day drug.

## 2018-12-31 NOTE — Research (Signed)
Open in error

## 2018-12-31 NOTE — Progress Notes (Signed)
Open in error

## 2019-01-01 LAB — COMPREHENSIVE METABOLIC PANEL
ALT: 7 IU/L (ref 0–32)
AST: 22 IU/L (ref 0–40)
Albumin/Globulin Ratio: 1.7 (ref 1.2–2.2)
Albumin: 3.8 g/dL (ref 3.5–4.6)
Alkaline Phosphatase: 88 IU/L (ref 39–117)
BUN/Creatinine Ratio: 17 (ref 12–28)
BUN: 22 mg/dL (ref 10–36)
Bilirubin Total: 0.4 mg/dL (ref 0.0–1.2)
CO2: 22 mmol/L (ref 20–29)
Calcium: 8.9 mg/dL (ref 8.7–10.3)
Chloride: 100 mmol/L (ref 96–106)
Creatinine, Ser: 1.28 mg/dL — ABNORMAL HIGH (ref 0.57–1.00)
GFR calc Af Amer: 41 mL/min/{1.73_m2} — ABNORMAL LOW (ref >59)
GFR calc non Af Amer: 36 mL/min/{1.73_m2} — ABNORMAL LOW (ref >59)
Globulin, Total: 2.3 g/dL (ref 1.5–4.5)
Glucose: 200 mg/dL — ABNORMAL HIGH (ref 65–99)
Potassium: 4.8 mmol/L (ref 3.5–5.2)
Sodium: 139 mmol/L (ref 134–144)
Total Protein: 6.1 g/dL (ref 6.0–8.5)

## 2019-01-01 LAB — CBC WITH DIFFERENTIAL/PLATELET
Basophils Absolute: 0 10*3/uL (ref 0.0–0.2)
Basos: 0 %
EOS (ABSOLUTE): 0.2 10*3/uL (ref 0.0–0.4)
Eos: 3 %
Hematocrit: 36.8 % (ref 34.0–46.6)
Hemoglobin: 12.1 g/dL (ref 11.1–15.9)
Immature Grans (Abs): 0.1 10*3/uL (ref 0.0–0.1)
Immature Granulocytes: 1 %
Lymphocytes Absolute: 0.8 10*3/uL (ref 0.7–3.1)
Lymphs: 12 %
MCH: 28.7 pg (ref 26.6–33.0)
MCHC: 32.9 g/dL (ref 31.5–35.7)
MCV: 87 fL (ref 79–97)
Monocytes Absolute: 0.6 10*3/uL (ref 0.1–0.9)
Monocytes: 9 %
Neutrophils Absolute: 5.2 10*3/uL (ref 1.4–7.0)
Neutrophils: 75 %
Platelets: 272 10*3/uL (ref 150–450)
RBC: 4.22 x10E6/uL (ref 3.77–5.28)
RDW: 14.3 % (ref 11.7–15.4)
WBC: 6.9 10*3/uL (ref 3.4–10.8)

## 2019-01-03 NOTE — Research (Signed)
Late entry:   Patient was doing well, no complaints of chest pain or shortness of breath. She is super cute.  I explained to her I was changing her meds and that she would have to take the new pill twice a day. She said just tell [name]. Alexa Mcdonald is aware of the changes. I did let him know that I took her envisage pill bottle and pills from her daily pill holder. I explained that I didn't want them to get the pills confused and her take double by mistake. He understood. Her last dose of Edoxban was 12/30/2018 and her Eliquis will start 12/31/2018 in the evening. Patient was thankful for coming out to see her.  Will check on her again in 20-30 days.   Returned medication and count: Bottles/Kit # 1. [id number] #55 left  2. [id number] #0 per son bottle was thrown away 3. [id number] #0 per son bottle was thrown away 4. [id number] #0 per son bottle was thrown away   Date of last dose:  12/30/2018 Date of last visit:  06/11/2018  Number of days between visits: 203 Number of missed doses/days: 7 while in hospital   # of pills dispensed (   240 - 55  of pills returned) ______________________________________  = ___185___ = 91% # of days between visits x 1 (pills per day)                   203  Patient had two visits to the hospital between research visits from Nov 2019 till May 2020.

## 2019-01-05 NOTE — Telephone Encounter (Signed)
Called and spoke with Son, he states that it would be easier for patient to have nurse to come out and draw labs vs bring his mother out. He will find out if the home health system/aide  that they use has the ability to send out a nurse to draw labs and will let me know, as well as get a fax number to fax lab order.

## 2019-01-26 NOTE — Telephone Encounter (Signed)
Spoke with pt's son today. He requested to cancel appointment for tomorrow. He states that his mom is doing well, but they have not had a chance to have labs drawn because he recently had surgery and was unable to drive. He requested to have appointment r/s in A month or so, he will bring his mom in for labs 1 week prior to that appointment. I informed him that once we have July 2020 office schedule available will call and get the pt r/s.

## 2019-01-27 ENCOUNTER — Ambulatory Visit: Payer: Medicare Other | Admitting: Gastroenterology

## 2019-03-04 DIAGNOSIS — H16223 Keratoconjunctivitis sicca, not specified as Sjogren's, bilateral: Secondary | ICD-10-CM | POA: Diagnosis not present

## 2019-03-04 DIAGNOSIS — H04123 Dry eye syndrome of bilateral lacrimal glands: Secondary | ICD-10-CM | POA: Diagnosis not present

## 2019-03-04 DIAGNOSIS — H353131 Nonexudative age-related macular degeneration, bilateral, early dry stage: Secondary | ICD-10-CM | POA: Diagnosis not present

## 2019-03-04 DIAGNOSIS — H40012 Open angle with borderline findings, low risk, left eye: Secondary | ICD-10-CM | POA: Diagnosis not present

## 2019-03-04 DIAGNOSIS — H02831 Dermatochalasis of right upper eyelid: Secondary | ICD-10-CM | POA: Diagnosis not present

## 2019-03-11 DIAGNOSIS — Z20828 Contact with and (suspected) exposure to other viral communicable diseases: Secondary | ICD-10-CM | POA: Diagnosis not present

## 2019-03-25 ENCOUNTER — Other Ambulatory Visit: Payer: Self-pay | Admitting: Gastroenterology

## 2019-03-25 DIAGNOSIS — Z119 Encounter for screening for infectious and parasitic diseases, unspecified: Secondary | ICD-10-CM | POA: Diagnosis not present

## 2019-03-30 ENCOUNTER — Other Ambulatory Visit: Payer: Self-pay | Admitting: Gastroenterology

## 2019-05-02 ENCOUNTER — Ambulatory Visit: Payer: Medicare Other | Admitting: Cardiology

## 2019-05-18 ENCOUNTER — Other Ambulatory Visit: Payer: Self-pay | Admitting: Cardiology

## 2019-05-18 MED ORDER — APIXABAN 2.5 MG PO TABS: 2.5000 mg | ORAL_TABLET | Freq: Two times a day (BID) | ORAL | 1 refills | Status: DC

## 2019-05-18 NOTE — Telephone Encounter (Signed)
Patient overdue for OV but has appointment scheduled in Nov Scr 85.71 85 years old 62 kg  Since weight is very [place] will give enough 2.5mg  until patient appointment with Dr. [name] than can reevaluate.

## 2019-05-18 NOTE — Telephone Encounter (Signed)
°*  STAT* If patient is at the pharmacy, call can be transferred to refill team.   1. Which medications need to be refilled? (please list name of each medication and dose if known) Eliquis  2. Which pharmacy/location (including street and city if local pharmacy) is medication to be sent to?[facility], [place], [place]  3. Do they need a 30 day or 90 day supply?  30 days and refills

## 2019-06-21 ENCOUNTER — Ambulatory Visit: Payer: Medicare Other | Admitting: Cardiology

## 2019-07-04 DIAGNOSIS — L820 Inflamed seborrheic keratosis: Secondary | ICD-10-CM | POA: Diagnosis not present

## 2019-07-04 DIAGNOSIS — K3189 Other diseases of stomach and duodenum: Secondary | ICD-10-CM | POA: Diagnosis not present

## 2019-07-04 DIAGNOSIS — I639 Cerebral infarction, unspecified: Secondary | ICD-10-CM | POA: Diagnosis not present

## 2019-07-04 DIAGNOSIS — D692 Other nonthrombocytopenic purpura: Secondary | ICD-10-CM | POA: Diagnosis not present

## 2019-07-12 ENCOUNTER — Other Ambulatory Visit: Payer: Self-pay

## 2019-07-12 ENCOUNTER — Telehealth: Payer: Self-pay | Admitting: *Deleted

## 2019-07-12 MED ORDER — APIXABAN 2.5 MG PO TABS: 2.5000 mg | ORAL_TABLET | Freq: Two times a day (BID) | ORAL | 1 refills | Status: DC

## 2019-07-12 NOTE — Telephone Encounter (Signed)
Prescription refill request for Eliquis received.  Last office visit: 07/09/2017 Scr: 1.28, 12/31/2018 Age: 85 y.o. Weight: 62.6 kg, 12/02/2018  Pt has on office visit scheduled with Dr. [name] on 08/16/2019.

## 2019-07-12 NOTE — Telephone Encounter (Signed)
Office visit with Dr [name] moved from November to January - will send in Eliquis 2.5mg  refill to last through upcoming appt. Pt has not been weight since April 2020 and at that time 1 weight was < 60kg and one was > 60kg. Will reevaluate Eliquis dosing after pt is seen in office in January and updated weight is obtained. Renal function stable with Scr < 1.5.

## 2019-07-14 ENCOUNTER — Other Ambulatory Visit: Payer: Self-pay | Admitting: Gastroenterology

## 2019-08-15 ENCOUNTER — Telehealth: Payer: Self-pay | Admitting: Cardiology

## 2019-08-15 NOTE — Telephone Encounter (Signed)
New Message     Alexa Mcdonald is calling and says her daughter Alexa Mcdonald and herself will need to assist the pt to her appt because shes uses a walker    Please call

## 2019-08-15 NOTE — Telephone Encounter (Signed)
Patient's daughter-in-law, Doran Clay, is calling because she needs to accompany the patient to her appointment due to memory issues and physical ability.

## 2019-08-15 NOTE — Progress Notes (Signed)
Cardiology Office Note:    Date:  08/16/2019   ID:  Alexa Mcdonald, DOB 12-18-1920, MRN [id number]  PCP:  Jonathon Jordan, MD  Cardiologist:  No primary care provider on file.    Referring MD: Jonathon Jordan, MD   Chief Complaint  Patient presents with  . Aortic Stenosis  . Coronary Artery Disease  . Hypertension  . Atrial Fibrillation  . Congestive Heart Failure    History of Present Illness:    Alexa Mcdonald is a 86 y.o. female with a hx of severe AS s/p TAVR 11/2015 with 65mm Edwards Sapien 3 THV via percutaneous right transfemoral approach.  She had developed progressive shortness of breath with low-level activity (NYHA functional class III) and was found to have both a critical aortic stenosis and severe mitral regurgitation. Preoperative cardiac catheterization showed mild nonobstructive CAD. She also has HTN, PAF on chronic anticoagulation, chronic diastolic CHF, moderate MR and chronic LE edema. Post TAVR she had dramatic improvement in her SOB.    She is here today for followup and is doing well.  She denies any chest pain or pressure, SOB, DOE, PND, orthopnea, LE edema, dizziness, palpitations or syncope. She is compliant with her meds and is tolerating meds with no SE.    Past Medical History:  Diagnosis Date  . 1st degree AV block   . Bradycardia   . Chronic diastolic heart failure ([place]) 02/07/2016  . CKD (chronic kidney disease), stage III   . Colon, diverticulosis   . Diabetes mellitus    diet controlled  . Essential hypertension   . GERD (gastroesophageal reflux disease)   . Hematochezia   . HOH (hard of hearing)    wears bilateral hearing aids  . Hx of orthostatic hypotension   . Interstitial lung disease ([place])   . LBBB (left bundle branch block)   . Long term current use of anticoagulant therapy   . Mediastinal goiter     recurrent s/p resection 1988  . Mild CAD   . Mitral regurgitation   . Osteopenia   . Osteoporosis   . Paroxysmal atrial  fibrillation (HCC)    a. CHA2DS2VASc = 5-->coumadin;  b. previously on amio->d/c'd 11/2014 secondary to concern for possible amio lung.  . Pneumonia   . 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 Severe aortic stenosis   . Stroke ([place])   . Tachycardia-bradycardia ([place])   . Vitamin D deficiency     Past Surgical History:  Procedure Laterality Date  . BILIARY DILATION  10/24/2018   Procedure: BILIARY DILATION;  Surgeon: Jackquline Denmark, MD;  Location: [facility];  Service: Endoscopy;;  . BIOPSY  11/11/2018   Procedure: BIOPSY;  Surgeon: Irving Copas., MD;  Location: [facility];  Service: Gastroenterology;;  . CARDIAC CATHETERIZATION N/A 10/31/2015   Procedure: Right/Left Heart Cath and Coronary Angiography;  Surgeon: [name], MD;  Location: [place] CV LAB;  Service: Cardiovascular;  Laterality: N/A;  . CARPAL TUNNEL RELEASE Bilateral   . CATARACT EXTRACTION Bilateral   . CESAREAN SECTION     x3  . CHOLECYSTECTOMY N/A 10/25/2018   Procedure: LAPAROSCOPIC CHOLECYSTECTOMY WITH INTRAOPERATIVE CHOLANGIOGRAM;  Surgeon: Kieth Brightly Arta Bruce, MD;  Location: [facility] ORS;  Service: General;  Laterality: N/A;  . ERCP N/A 10/24/2018   Procedure: ENDOSCOPIC RETROGRADE CHOLANGIOPANCREATOGRAPHY (ERCP);  Surgeon: Jackquline Denmark, MD;  Location: [facility];  Service: Endoscopy;  Laterality:  N/A;  . ESOPHAGOGASTRODUODENOSCOPY (EGD) WITH PROPOFOL N/A 11/11/2018   Procedure: ESOPHAGOGASTRODUODENOSCOPY (EGD) WITH PROPOFOL;  Surgeon: Rush Landmark Telford Nab., MD;  Location: [place];  Service: Gastroenterology;  Laterality: N/A;  . mediastinal removal of a goiter    . MRI  08/18/07   head (diabetes heart study at [facility])  . REMOVAL OF STONES  10/24/2018   Procedure: REMOVAL OF STONES;  Surgeon: Jackquline Denmark, MD;  Location: [facility];  Service: Endoscopy;;  . Joan Mayans   10/24/2018   Procedure: Joan Mayans;  Surgeon: Jackquline Denmark, MD;  Location: [facility];  Service: Endoscopy;;  . TEE WITHOUT CARDIOVERSION N/A 11/27/2015   Procedure: TRANSESOPHAGEAL ECHOCARDIOGRAM (TEE);  Surgeon: [name], MD;  Location: [place];  Service: Open Heart Surgery;  Laterality: N/A;  . TRANSCATHETER AORTIC VALVE REPLACEMENT, TRANSFEMORAL N/A 11/27/2015   Procedure: TRANSCATHETER AORTIC VALVE REPLACEMENT, TRANSFEMORAL;  Surgeon: [name], MD;  Location: [place];  Service: Open Heart Surgery;  Laterality: N/A;    Current Medications: Current Meds  Medication Sig  . acetaminophen (TYLENOL) 325 MG tablet Take 2 tablets (650 mg total) by mouth every 6 (six) hours as needed for mild pain.  [name] apixaban (ELIQUIS) 2.5 MG TABS tablet Take 1 tablet (2.5 mg total) by mouth 2 (two) times daily.  [name] atorvastatin (LIPITOR) 10 MG tablet Take 10 mg daily by mouth.  . calcium-vitamin D 250-100 MG-UNIT tablet Take 1 tablet by mouth daily.  . FEROSUL 325 (65 Fe) MG tablet TAKE 1 TABLET(325 MG) BY MOUTH EVERY MORNING  . methimazole (TAPAZOLE) 5 MG tablet Take 7.5 mg by mouth daily.  . Multiple Vitamins-Minerals (OCUVITE PRESERVISION PO) Take 1 capsule by mouth daily.   . pantoprazole (PROTONIX) 40 MG tablet TAKE 1 TABLET(40 MG) BY MOUTH DAILY AT 6 AM  . vitamin B-12 (CYANOCOBALAMIN) 100 MCG tablet Take 100 mcg by mouth daily.  [name] VITAMIN D PO Take 1 tablet by mouth daily.     Allergies:   Amoxicillin and Celebrex [celecoxib]   Social History   Socioeconomic History  . Marital status: Widowed    Spouse name: Not on file  . Number of children: 3  . Years of education: Not on file  . Highest education level: Not on file  Occupational History  . Occupation: retire  Tobacco Use  . Smoking status: Never Smoker  . Smokeless tobacco: Never Used  Substance and Sexual Activity  . Alcohol use: No    Alcohol/week: 0.0 standard drinks  . Drug use: No  . Sexual activity: Not on file    Other Topics Concern  . Not on file  Social History Narrative   Lives in [place].  Widowed.   Social Determinants of Health   Financial Resource Strain:   . Difficulty of Paying Living Expenses: Not on file  Food Insecurity:   . Worried About Charity fundraiser in the Last Year: Not on file  . Ran Out of Food in the Last Year: Not on file  Transportation Needs:   . Lack of Transportation (Medical): Not on file  . Lack of Transportation (Non-Medical): Not on file  Physical Activity:   . Days of Exercise per Week: Not on file  . Minutes of Exercise per Session: Not on file  Stress:   . Feeling of Stress : Not on file  Social Connections:   . Frequency of Communication with Friends and Family: Not on file  . Frequency of Social Gatherings with Friends and Family: Not on file  .  Attends Religious Services: Not on file  . Active Member of Clubs or Organizations: Not on file  . Attends Archivist Meetings: Not on file  . Marital Status: Not on file     Family History: The patient's family history includes Arrhythmia in her brother; CAD in her brother; Cancer - Prostate in her father; Diabetes in her mother and sister; Hypertension in her brother and sister; Pulmonary embolism in her father; Pulmonary fibrosis in her brother. There is no history of Colon cancer, Esophageal cancer, Inflammatory bowel disease, Liver disease, Pancreatic cancer, Stomach cancer, or Rectal cancer.  ROS:   Please see the history of present illness.    ROS  All other systems reviewed and negative.   EKGs/Labs/Other Studies Reviewed:    The following studies were reviewed today: none  EKG:  EKG is  ordered today.  The ekg ordered today demonstrates atrial fibrillation with CVR   Recent Labs: 11/10/2018: B Natriuretic Peptide 550.0 11/13/2018: Magnesium 2.1; TSH 1.919 12/31/2018: ALT 7; BUN 22; Creatinine, Ser 1.28; Hemoglobin 12.1; Platelets 272; Potassium 4.8; Sodium 139   Recent Lipid  Panel No results found for: CHOL, TRIG, HDL, CHOLHDL, VLDL, LDLCALC, LDLDIRECT  Physical Exam:    VS:  BP 136/64   Pulse (!) 58   Ht 5' (1.524 m)   Wt 132 lb (59.9 kg)   BMI 25.78 kg/m     Wt Readings from Last 3 Encounters:  08/16/19 132 lb (59.9 kg)  12/02/18 138 lb (62.6 kg)  11/13/18 131 lb (59.4 kg)     GEN:  Well nourished, well developed in no acute distress HEENT: Normal NECK: No JVD; No carotid bruits LYMPHATICS: No lymphadenopathy CARDIAC: RRR, no rubs, gallops.  2/6 SM at RUSB RESPIRATORY:  Clear to auscultation without rales, wheezing or rhonchi  ABDOMEN: Soft, non-tender, non-distended MUSCULOSKELETAL:  No edema; No deformity  SKIN: Warm and dry NEUROLOGIC:  Alert and oriented x 3 PSYCHIATRIC:  Normal affect   ASSESSMENT:    1. Severe aortic stenosis   2. Dizziness   3. Coronary artery disease involving native coronary artery of native heart without angina pectoris   4. Essential hypertension   5. Paroxysmal atrial fibrillation (HCC)   6. Chronic diastolic heart failure (HCC)    PLAN:    In order of problems listed above:  1.  Severe AS -s/p TAVR which was stable on echo 2018 with mean AVG 52mmHg and mild MR -will repeat echo since she is now past the 2 year mark to reassess her valve -no ASA due to Apixaban  2. Dizziness -this has resolved  3.  ASCAD -mild nonobstructive by cath 2017 -denies any anginal sx -continue statin -no ASA due to DOAC  4.  HTN -BP controlled -she is currently not on antihypertensive meds  5.  Permanent atrial fibrillation -her HR was controlled -denies any palpitations -continue Apixaban 2.5mg  BID (dosed for age > 20 and weight around 60kg) -check BMET and CBC  6.  Chronic diastolic CHF -she appears euvolemic on exam today.   -weight is stable and she denies any significant SOB or LE edema -continue PRN Lasix for LE edema or weight gain   Medication Adjustments/Labs and Tests Ordered: Current medicines  are reviewed at length with the patient today.  Concerns regarding medicines are outlined above.  Orders Placed This Encounter  Procedures  . Basic Metabolic Panel (BMET)  . CBC  . EKG 12/Charge capture  . ECHOCARDIOGRAM COMPLETE   No orders of  the defined types were placed in this encounter.   Signed, Fransico Him, MD  08/16/2019 12:30 PM    [place]

## 2019-08-16 ENCOUNTER — Encounter: Payer: Self-pay | Admitting: Cardiology

## 2019-08-16 ENCOUNTER — Other Ambulatory Visit: Payer: Self-pay

## 2019-08-16 ENCOUNTER — Ambulatory Visit (INDEPENDENT_AMBULATORY_CARE_PROVIDER_SITE_OTHER): Payer: Medicare Other | Admitting: Cardiology

## 2019-08-16 VITALS — BP 136/64 | HR 58 | Ht 60.0 in | Wt 132.0 lb

## 2019-08-16 DIAGNOSIS — I5032 Chronic diastolic (congestive) heart failure: Secondary | ICD-10-CM

## 2019-08-16 DIAGNOSIS — I350 Nonrheumatic aortic (valve) stenosis: Secondary | ICD-10-CM

## 2019-08-16 DIAGNOSIS — R42 Dizziness and giddiness: Secondary | ICD-10-CM

## 2019-08-16 DIAGNOSIS — I2510 Atherosclerotic heart disease of native coronary artery without angina pectoris: Secondary | ICD-10-CM | POA: Diagnosis not present

## 2019-08-16 DIAGNOSIS — I10 Essential (primary) hypertension: Secondary | ICD-10-CM | POA: Diagnosis not present

## 2019-08-16 DIAGNOSIS — I480 Paroxysmal atrial fibrillation: Secondary | ICD-10-CM

## 2019-08-16 LAB — BASIC METABOLIC PANEL
BUN/Creatinine Ratio: 20 (ref 12–28)
BUN: 32 mg/dL (ref 10–36)
CO2: 25 mmol/L (ref 20–29)
Calcium: 9.7 mg/dL (ref 8.7–10.3)
Chloride: 99 mmol/L (ref 96–106)
Creatinine, Ser: 1.61 mg/dL — ABNORMAL HIGH (ref 0.57–1.00)
GFR calc Af Amer: 31 mL/min/{1.73_m2} — ABNORMAL LOW (ref >59)
GFR calc non Af Amer: 27 mL/min/{1.73_m2} — ABNORMAL LOW (ref >59)
Glucose: 183 mg/dL — ABNORMAL HIGH (ref 65–99)
Potassium: 4.7 mmol/L (ref 3.5–5.2)
Sodium: 137 mmol/L (ref 134–144)

## 2019-08-16 LAB — CBC
Hematocrit: 40.1 % (ref 34.0–46.6)
Hemoglobin: 13.9 g/dL (ref 11.1–15.9)
MCH: 31.7 pg (ref 26.6–33.0)
MCHC: 34.7 g/dL (ref 31.5–35.7)
MCV: 92 fL (ref 79–97)
Platelets: 234 10*3/uL (ref 150–450)
RBC: 4.38 x10E6/uL (ref 3.77–5.28)
RDW: 12.6 % (ref 11.7–15.4)
WBC: 8.9 10*3/uL (ref 3.4–10.8)

## 2019-08-16 NOTE — Patient Instructions (Signed)
Medication Instructions:  Your physician recommends that you continue on your current medications as directed. Please refer to the Current Medication list given to you today.  *If you need a refill on your cardiac medications before your next appointment, please call your pharmacy*  Lab Work: TODAY:  If you have labs (blood work) drawn today and your tests are completely normal, you will receive your results only by: [name] MyChart Message (if you have MyChart) OR . A paper copy in the mail If you have any lab test that is abnormal or we need to change your treatment, we will call you to review the results.  Testing/Procedures: Your physician has requested that you have an echocardiogram. Echocardiography is a painless test that uses sound waves to create images of your heart. It provides your doctor with information about the size and shape of your heart and how well your heart's chambers and valves are working. This procedure takes approximately one hour. There are no restrictions for this procedure.  Follow-Up: At [facility], you and your health needs are our priority.  As part of our continuing mission to provide you with exceptional heart care, we have created designated Provider Care Teams.  These Care Teams include your primary Cardiologist (physician) and Advanced Practice Providers (APPs -  Physician Assistants and Nurse Practitioners) who all work together to provide you with the care you need, when you need it.  Your next appointment:   1 year(s)  The format for your next appointment:   In Person  Provider:   You may see Dr. Fransico Him or one of the following Advanced Practice Providers on your designated Care Team:    Melina Copa, PA-C  Ermalinda Barrios, PA-C

## 2019-08-25 DIAGNOSIS — Z23 Encounter for immunization: Secondary | ICD-10-CM | POA: Diagnosis not present

## 2019-08-26 ENCOUNTER — Ambulatory Visit (HOSPITAL_COMMUNITY): Payer: Medicare Other | Attending: Cardiology

## 2019-08-26 ENCOUNTER — Other Ambulatory Visit: Payer: Self-pay

## 2019-08-26 DIAGNOSIS — I350 Nonrheumatic aortic (valve) stenosis: Secondary | ICD-10-CM | POA: Diagnosis not present

## 2019-08-29 ENCOUNTER — Encounter: Payer: Self-pay | Admitting: Cardiology

## 2019-09-09 DIAGNOSIS — H40012 Open angle with borderline findings, low risk, left eye: Secondary | ICD-10-CM | POA: Diagnosis not present

## 2019-09-09 DIAGNOSIS — H16223 Keratoconjunctivitis sicca, not specified as Sjogren's, bilateral: Secondary | ICD-10-CM | POA: Diagnosis not present

## 2019-09-09 DIAGNOSIS — H02831 Dermatochalasis of right upper eyelid: Secondary | ICD-10-CM | POA: Diagnosis not present

## 2019-09-09 DIAGNOSIS — H52223 Regular astigmatism, bilateral: Secondary | ICD-10-CM | POA: Diagnosis not present

## 2019-09-09 DIAGNOSIS — H04123 Dry eye syndrome of bilateral lacrimal glands: Secondary | ICD-10-CM | POA: Diagnosis not present

## 2019-09-09 DIAGNOSIS — H524 Presbyopia: Secondary | ICD-10-CM | POA: Diagnosis not present

## 2019-09-22 DIAGNOSIS — Z23 Encounter for immunization: Secondary | ICD-10-CM | POA: Diagnosis not present

## 2019-10-07 DIAGNOSIS — H02831 Dermatochalasis of right upper eyelid: Secondary | ICD-10-CM | POA: Diagnosis not present

## 2019-10-07 DIAGNOSIS — H04123 Dry eye syndrome of bilateral lacrimal glands: Secondary | ICD-10-CM | POA: Diagnosis not present

## 2019-10-07 DIAGNOSIS — H40012 Open angle with borderline findings, low risk, left eye: Secondary | ICD-10-CM | POA: Diagnosis not present

## 2019-10-07 DIAGNOSIS — H16223 Keratoconjunctivitis sicca, not specified as Sjogren's, bilateral: Secondary | ICD-10-CM | POA: Diagnosis not present

## 2019-11-07 ENCOUNTER — Other Ambulatory Visit: Payer: Self-pay | Admitting: Gastroenterology

## 2020-01-31 ENCOUNTER — Other Ambulatory Visit: Payer: Self-pay | Admitting: Cardiology

## 2020-01-31 NOTE — Telephone Encounter (Signed)
Prescription refill request for Eliquis received. Indication: Atrial Fibrillation Last office visit: 08/16/19 Dr [name] Scr: 1.61 08/16/19 Age: 86 Weight: 59.9 kg  Prescription refill sent

## 2020-03-07 ENCOUNTER — Telehealth: Payer: Self-pay | Admitting: Cardiology

## 2020-03-07 ENCOUNTER — Telehealth: Payer: Self-pay | Admitting: Gastroenterology

## 2020-03-07 ENCOUNTER — Telehealth: Payer: Self-pay

## 2020-03-07 MED ORDER — FERROUS SULFATE 325 (65 FE) MG PO TABS: ORAL_TABLET | ORAL | 3 refills | Status: DC

## 2020-03-07 NOTE — Telephone Encounter (Signed)
Please see previous note. Refill had already been sent and pt was informed.

## 2020-03-07 NOTE — Telephone Encounter (Signed)
Called pt daughter back to inform her that the pt needs to contact Mansouraty, Telford Nab., MD, for a refill because that is who prescribed the medication ferosul 325 mg tablet. I advised the daughter that if the pt has any other heart problems, questions or concerns, to please give our office a call back. Daughter verbalized understanding.

## 2020-03-07 NOTE — Telephone Encounter (Signed)
*  STAT* If patient is at the pharmacy, call can be transferred to refill team.   1. Which medications need to be refilled? (please list name of each medication and dose if known)? FEROSUL 325 (65 Fe) MG tablet  2. Which pharmacy/location (including street and city if local pharmacy) is medication to be sent to? [id number] - [place], [place] [address] AT [address]  3. Do they need a 30 day or 90 day supply? 90 day

## 2020-03-07 NOTE — Telephone Encounter (Signed)
Patient needs to refill Ferosul. Please send it to [place] at [facility].

## 2020-03-07 NOTE — Telephone Encounter (Signed)
Refill for Ferrous Sulfate sent to [place]. Pt has been informed.

## 2020-05-01 DIAGNOSIS — Z23 Encounter for immunization: Secondary | ICD-10-CM | POA: Diagnosis not present

## 2020-05-15 ENCOUNTER — Other Ambulatory Visit: Payer: Self-pay

## 2020-05-15 ENCOUNTER — Emergency Department (HOSPITAL_COMMUNITY)
Admission: EM | Admit: 2020-05-15 | Discharge: 2020-05-15 | Disposition: A | Discharge status: Home / Self Care | Payer: Medicare Other | Attending: Emergency Medicine | Admitting: Emergency Medicine

## 2020-05-15 ENCOUNTER — Encounter (HOSPITAL_COMMUNITY): Payer: Self-pay

## 2020-05-15 ENCOUNTER — Emergency Department (HOSPITAL_COMMUNITY): Payer: Medicare Other

## 2020-05-15 DIAGNOSIS — S61512A Laceration without foreign body of left wrist, initial encounter: Secondary | ICD-10-CM | POA: Diagnosis not present

## 2020-05-15 DIAGNOSIS — W19XXXA Unspecified fall, initial encounter: Secondary | ICD-10-CM | POA: Diagnosis not present

## 2020-05-15 DIAGNOSIS — E119 Type 2 diabetes mellitus without complications: Secondary | ICD-10-CM | POA: Diagnosis not present

## 2020-05-15 DIAGNOSIS — Z7901 Long term (current) use of anticoagulants: Secondary | ICD-10-CM | POA: Insufficient documentation

## 2020-05-15 DIAGNOSIS — S52502A Unspecified fracture of the lower end of left radius, initial encounter for closed fracture: Secondary | ICD-10-CM | POA: Diagnosis not present

## 2020-05-15 DIAGNOSIS — I5033 Acute on chronic diastolic (congestive) heart failure: Secondary | ICD-10-CM | POA: Insufficient documentation

## 2020-05-15 DIAGNOSIS — E1165 Type 2 diabetes mellitus with hyperglycemia: Secondary | ICD-10-CM | POA: Diagnosis not present

## 2020-05-15 DIAGNOSIS — I130 Hypertensive heart and chronic kidney disease with heart failure and stage 1 through stage 4 chronic kidney disease, or unspecified chronic kidney disease: Secondary | ICD-10-CM | POA: Insufficient documentation

## 2020-05-15 DIAGNOSIS — S62102A Fracture of unspecified carpal bone, left wrist, initial encounter for closed fracture: Secondary | ICD-10-CM

## 2020-05-15 DIAGNOSIS — S6992XA Unspecified injury of left wrist, hand and finger(s), initial encounter: Secondary | ICD-10-CM | POA: Diagnosis present

## 2020-05-15 DIAGNOSIS — R52 Pain, unspecified: Secondary | ICD-10-CM | POA: Diagnosis not present

## 2020-05-15 DIAGNOSIS — N183 Chronic kidney disease, stage 3 unspecified: Secondary | ICD-10-CM | POA: Diagnosis not present

## 2020-05-15 DIAGNOSIS — S6292XA Unspecified fracture of left wrist and hand, initial encounter for closed fracture: Secondary | ICD-10-CM | POA: Diagnosis not present

## 2020-05-15 DIAGNOSIS — R410 Disorientation, unspecified: Secondary | ICD-10-CM | POA: Diagnosis not present

## 2020-05-15 IMAGING — CR DG WRIST COMPLETE 3+V*L*
4 series · 4 of 4 positions shown · non-contrast
Comparison: None.

CLINICAL DATA: [AGE] female status post unwitnessed fall with
pain.

EXAM:
LEFT WRIST - COMPLETE 3+ VIEW

[x wrist pa left]
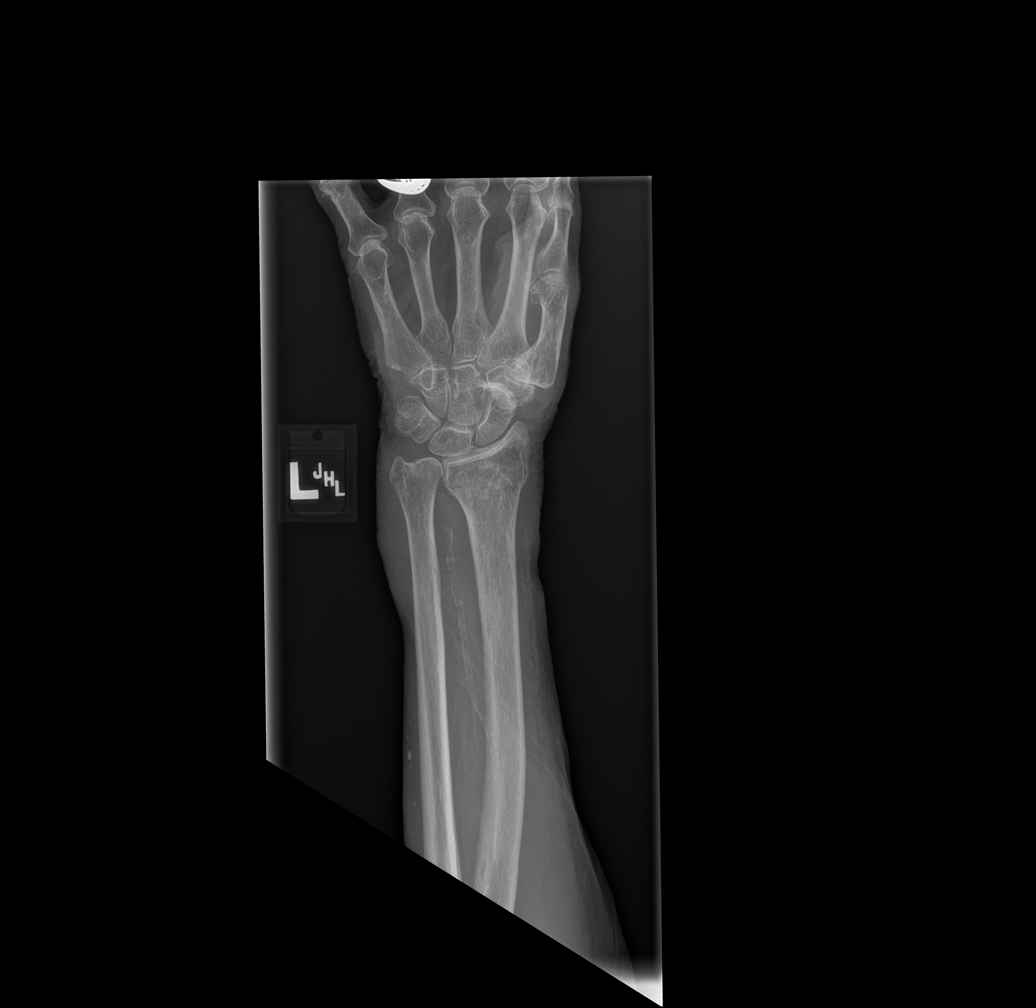

[x wrist obl left]
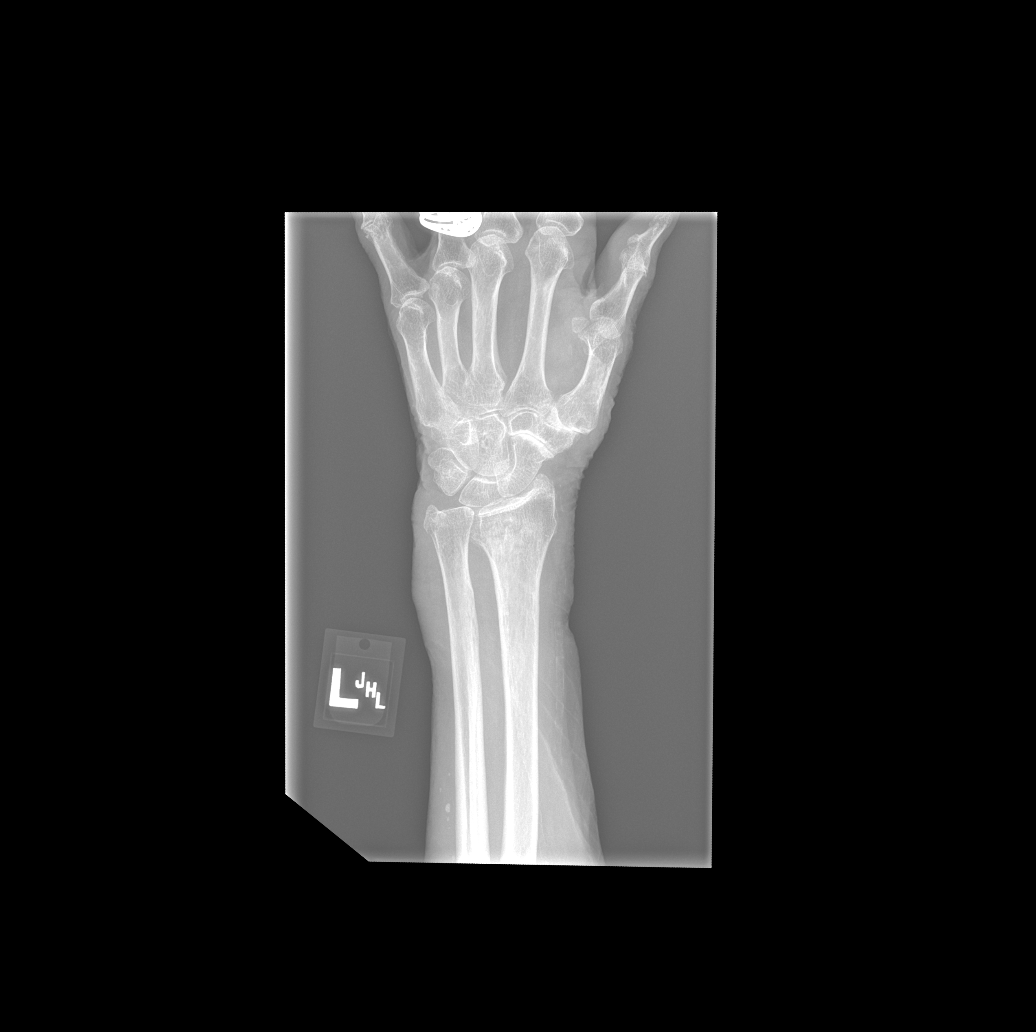

[x wrist lat left]
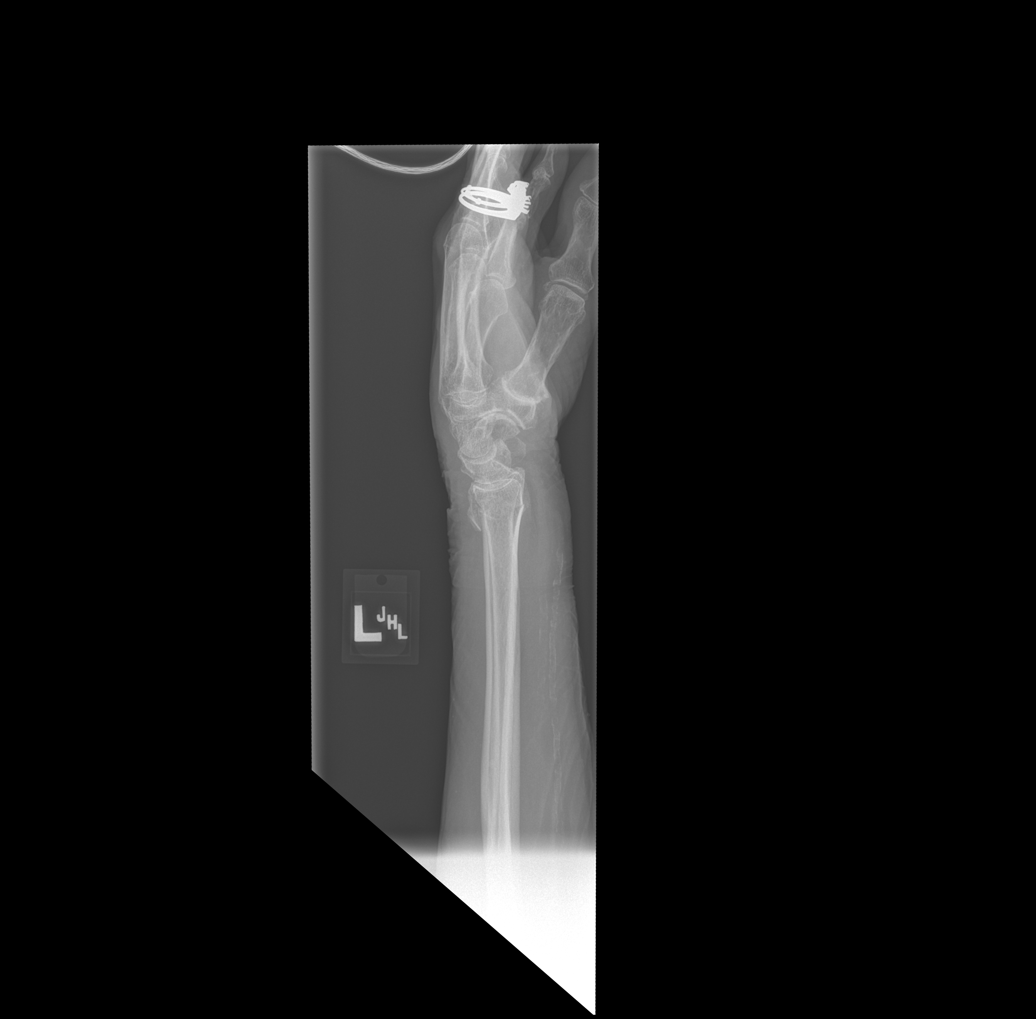

[x wrist navicular view left]
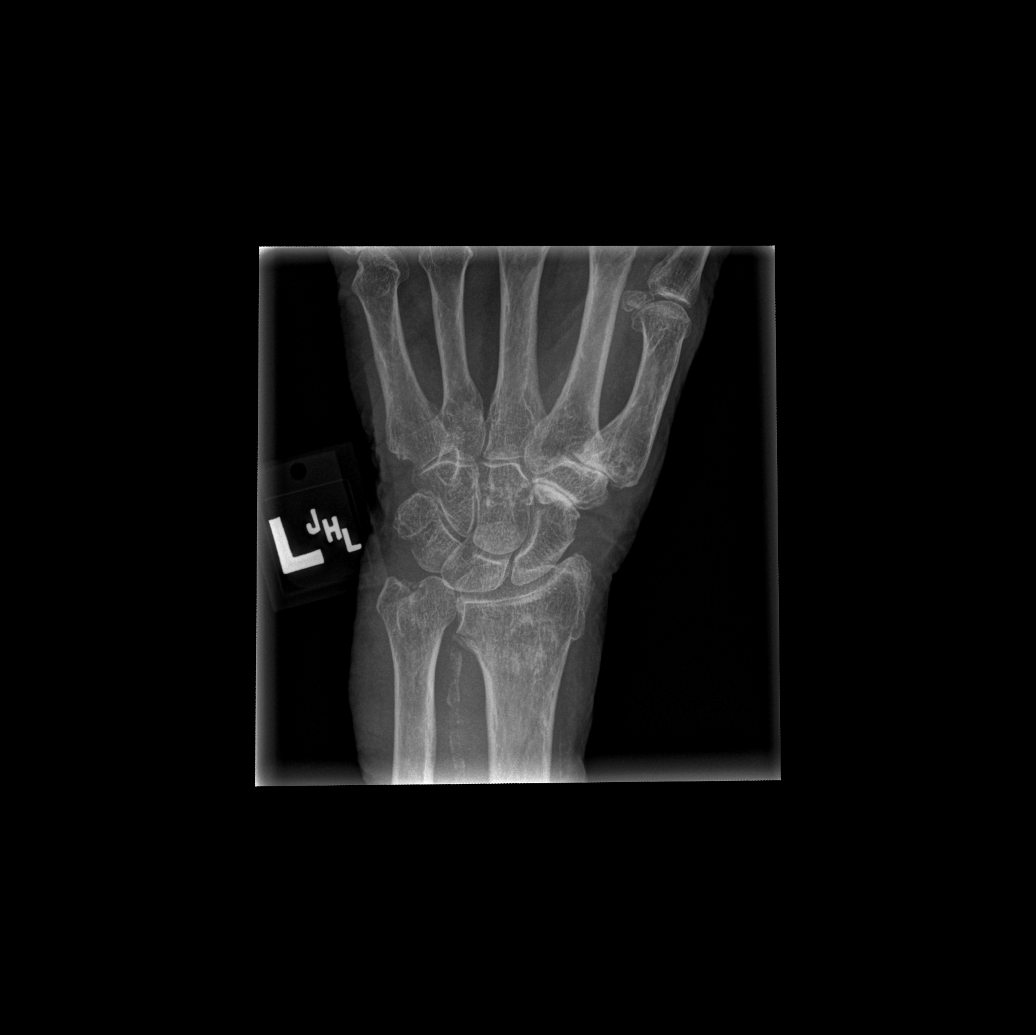

[4 of 4 positions shown; findings below may reference images not displayed]

FINDINGS: Comminuted and mild dorsally impacted distal left radius fracture.
Mild dorsal angulation. FANNY involvement. No definite radiocarpal
joint involvement.

Minimally displaced ulnar styloid fracture.

Carpal bone alignment maintained. The scaphoid appears intact. There
is chronic osteoarthritis with joint space loss and subchondral
sclerosis along the radial carpal bones. Visible metacarpals appear
intact.

Calcified peripheral vascular disease.
IMPRESSION: 1. Comminuted and mild dorsally impacted distal left radius fracture
with FANNY involvement.
2. Minimally displaced ulnar styloid fracture.

## 2020-05-15 NOTE — ED Triage Notes (Signed)
Pt BIB EMS from [place]. Pt had unwitnessed fall. Pt now have left wrist pain and swelling. Pt denies hitting her head. Pt is on blood thinners.

## 2020-05-15 NOTE — Progress Notes (Signed)
Orthopedic Tech Progress Note Patient Details:  JOHANNE MCGLADE 10/12/1920 [id number]  Ortho Devices Type of Ortho Device: Ace wrap, Sugartong splint Ortho Device/Splint Location: LUE splint and sling Ortho Device/Splint Interventions: Ordered, Application   Post Interventions Patient Tolerated: Well Instructions Provided: Care of device   Braulio Bosch 05/15/2020, 8:22 PM

## 2020-05-15 NOTE — Discharge Instructions (Addendum)
You have been diagnosed with a wrist fracture.  Please be sure to call our orthopedic colleague, Dr. Fredna Dow tomorrow morning.  Inform them that you were seen today in the emergency department, and the orthopedist like to see you in the coming days for follow-up.  Return here for concerning changes in your condition.

## 2020-05-15 NOTE — ED Provider Notes (Signed)
[facility] DEPT Provider Note   CSN: [id number] Arrival date & time: 05/15/20  1809     History Chief Complaint  Patient presents with  . Fall    Alexa Mcdonald is a 86 y.o. female.  HPI    Presents after mechanical fall with pain in her left wrist, less so in the left forearm. Patient recalls falling, but cannot specify what happened exactly. She denies any pain in his head, neck, weakness in the hand.  Indeed, patient denies any pain in the wrist until physical exam is performed. She states that she has been well generally, though she acknowledges multiple medical issues.  Past Medical History:  Diagnosis Date  . 1st degree AV block   . Bradycardia   . Chronic diastolic heart failure ([place]) 02/07/2016  . CKD (chronic kidney disease), stage III ([place])   . Colon, diverticulosis   . Diabetes mellitus    diet controlled  . Essential hypertension   . GERD (gastroesophageal reflux disease)   . Hematochezia   . HOH (hard of hearing)    wears bilateral hearing aids  . Hx of orthostatic hypotension   . Interstitial lung disease ([place])   . LBBB (left bundle branch block)   . Long term current use of anticoagulant therapy   . Mediastinal goiter     recurrent s/p resection 1988  . Mild CAD   . Mitral regurgitation   . Osteopenia   . Osteoporosis   . Paroxysmal atrial fibrillation (H[facility])    a. CHA2DS2VASc = 5-->coumadin;  b. previously on amio->d/c'd 11/2014 secondary to concern for possible amio lung.  . Pneumonia   . 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 Severe aortic stenosis    s/p TAVR with stable prosthesis by echo 08/2019 - mean AVG 2mmHg  . Stroke ([place])   . Tachycardia-bradycardia ([place])   . Vitamin D deficiency     Patient Active Problem List   Diagnosis Date Noted  . Iron deficiency anemia 12/05/2018  .  Duodenal ulcer 12/05/2018  . Gastric erosion 12/05/2018  . History of cholecystectomy 12/05/2018  . History of ERCP 12/05/2018  . Iron deficiency anemia due to chronic blood loss   . Erosive gastritis   . Acute lower GI bleeding 11/09/2018  . Acute blood loss anemia 11/09/2018  . Symptomatic anemia 11/09/2018  . Choledocholithiasis with obstruction 10/23/2018  . Occipital stroke ([place]) 06/11/2016  . Paroxysmal atrial fibrillation ([place]) 06/11/2016  . Homonymous hemianopsia due to old embolic stroke 19/50/9326  . Loss of peripheral visual field 03/12/2016  . Ataxia 03/12/2016  . Dizziness and giddiness 03/12/2016  . Cerebrovascular accident (CVA) due to embolism of posterior cerebral artery with infarctions of both occipital lobes ([place]) 03/12/2016  . Acute on chronic diastolic CHF (congestive heart failure) ([place]) 02/07/2016  . 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 Severe aortic stenosis   . Atrial fibrillation, persistent ([place])   . Mitral regurgitation   . Interstitial lung disease ([place]) 10/30/2014  . Hematochezia 12/13/2013  . CKD (chronic kidney disease), stage III ([place]) 12/13/2013  . Hyperthyroidism 12/13/2013  . GERD (gastroesophageal reflux disease) 12/13/2013  . Chronic anticoagulation 12/13/2013  . Elevated brain natriuretic peptide (BNP) level 12/13/2013  . Sinus bradycardia 12/13/2013  . Pneumonia 12/13/2013  . Encounter for therapeutic drug monitoring 08/29/2013  . Essential hypertension 08/05/2013  . Edema of both  ankles 08/05/2013  . Bradycardia 11/27/2011    Past Surgical History:  Procedure Laterality Date  . BILIARY DILATION  10/24/2018   Procedure: BILIARY DILATION;  Surgeon: Jackquline Denmark, MD;  Location: [facility] ENDOSCOPY;  Service: Endoscopy;;  . BIOPSY  11/11/2018   Procedure: BIOPSY;  Surgeon: Irving Copas., MD;  Location: [facility] ENDOSCOPY;  Service: Gastroenterology;;  . CARDIAC CATHETERIZATION N/A 10/31/2015   Procedure: Right/Left  Heart Cath and Coronary Angiography;  Surgeon: [name], MD;  Location: [facility] LAB;  Service: Cardiovascular;  Laterality: N/A;  . CARPAL TUNNEL RELEASE Bilateral   . CATARACT EXTRACTION Bilateral   . CESAREAN SECTION     x3  . CHOLECYSTECTOMY N/A 10/25/2018   Procedure: LAPAROSCOPIC CHOLECYSTECTOMY WITH INTRAOPERATIVE CHOLANGIOGRAM;  Surgeon: Kieth Brightly Arta Bruce, MD;  Location: [facility] ORS;  Service: General;  Laterality: N/A;  . ERCP N/A 10/24/2018   Procedure: ENDOSCOPIC RETROGRADE CHOLANGIOPANCREATOGRAPHY (ERCP);  Surgeon: Jackquline Denmark, MD;  Location: [facility];  Service: Endoscopy;  Laterality: N/A;  . ESOPHAGOGASTRODUODENOSCOPY (EGD) WITH PROPOFOL N/A 11/11/2018   Procedure: ESOPHAGOGASTRODUODENOSCOPY (EGD) WITH PROPOFOL;  Surgeon: Rush Landmark Telford Nab., MD;  Location: [facility];  Service: Gastroenterology;  Laterality: N/A;  . mediastinal removal of a goiter    . MRI  08/18/07   head (diabetes heart study at [facility])  . REMOVAL OF STONES  10/24/2018   Procedure: REMOVAL OF STONES;  Surgeon: Jackquline Denmark, MD;  Location: [facility]OS[facility];  Service: Endoscopy;;  . Joan Mayans  10/24/2018   Procedure: Joan Mayans;  Surgeon: Jackquline Denmark, MD;  Location: [facility];  Service: Endoscopy;;  . TEE WITHOUT CARDIOVERSION N/A 11/27/2015   Procedure: TRANSESOPHAGEAL ECHOCARDIOGRAM (TEE);  Surgeon: [name], MD;  Location: [facility];  Service: Open Heart Surgery;  Laterality: N/A;  . TRANSCATHETER AORTIC VALVE REPLACEMENT, TRANSFEMORAL N/A 11/27/2015   Procedure: TRANSCATHETER AORTIC VALVE REPLACEMENT, TRANSFEMORAL;  Surgeon: [name], MD;  Location: [facility];  Service: Open Heart Surgery;  Laterality: N/A;     OB History   No obstetric history on file.     Family History  Problem Relation Age of Onset  . Diabetes Mother   . Cancer - Prostate Father   . Pulmonary embolism Father   . Hypertension Sister   . Diabetes Sister   . Hypertension Brother   . Arrhythmia Brother         afib  . CAD Brother   . Pulmonary fibrosis Brother   . Colon cancer Neg Hx   . Esophageal cancer Neg Hx   . Inflammatory bowel disease Neg Hx   . Liver disease Neg Hx   . Pancreatic cancer Neg Hx   . Stomach cancer Neg Hx   . Rectal cancer Neg Hx     Social History   Tobacco Use  . Smoking status: Never Smoker  . Smokeless tobacco: Never Used  Substance Use Topics  . Alcohol use: No    Alcohol/week: 0.0 standard drinks  . Drug use: No    Home Medications Prior to Admission medications   Medication Sig Start Date End Date Taking? Authorizing Provider  acetaminophen (TYLENOL) 325 MG tablet Take 2 tablets (650 mg total) by mouth every 6 (six) hours as needed for mild pain. 10/26/18   Swayze, Ava, DO  atorvastatin (LIPITOR) 10 MG tablet Take 10 mg daily by mouth.    [provider]  calcium-vitamin D 250-100 MG-UNIT tablet Take 1 tablet by mouth daily.    [provider]  ELIQUIS 2.5 MG TABS tablet  TAKE 1 TABLET(2.5 MG) BY MOUTH TWICE DAILY 01/31/20   Sueanne Margarita, MD  ferrous sulfate (FEROSUL) 325 (65 FE) MG tablet TAKE 1 TABLET(325 MG) BY MOUTH EVERY MORNING 03/07/20   Mansouraty, Telford Nab., MD  methimazole (TAPAZOLE) 5 MG tablet Take 7.5 mg by mouth daily.    [provider]  Multiple Vitamins-Minerals (OCUVITE PRESERVISION PO) Take 1 capsule by mouth daily.     [provider]  pantoprazole (PROTONIX) 40 MG tablet TAKE 1 TABLET(40 MG) BY MOUTH DAILY AT 6 AM 03/30/19   Mansouraty, Telford Nab., MD  vitamin B-12 (CYANOCOBALAMIN) 100 MCG tablet Take 100 mcg by mouth daily.    [provider]  VITAMIN D PO Take 1 tablet by mouth daily.    [provider]    Allergies    Amoxicillin and Celebrex [celecoxib]  Review of Systems   Review of Systems  Constitutional:       Per HPI, otherwise negative  HENT:       Per HPI, otherwise negative  Respiratory:       Per HPI, otherwise negative  Cardiovascular:       Per  HPI, otherwise negative  Gastrointestinal: Negative for vomiting.  Endocrine:       Negative aside from HPI  Genitourinary:       Neg aside from HPI   Musculoskeletal:       Per HPI, otherwise negative  Skin: Positive for wound.  Neurological: Negative for syncope.  Hematological: Bruises/bleeds easily.    Physical Exam Updated Vital Signs BP (!) 188/68   Pulse 64   Temp 98.1 F (36.7 C)   Resp 16   SpO2 100%   Physical Exam Vitals and nursing note reviewed.  Constitutional:      General: She is not in acute distress.    Appearance: She is well-developed.  HENT:     Head: Normocephalic and atraumatic.  Eyes:     Conjunctiva/sclera: Conjunctivae normal.  Cardiovascular:     Rate and Rhythm: Normal rate and regular rhythm.  Pulmonary:     Effort: Pulmonary effort is normal. No respiratory distress.     Breath sounds: Normal breath sounds. No stridor.  Abdominal:     General: There is no distension.  Musculoskeletal:     Left elbow: Laceration present. No swelling, deformity or effusion. Normal range of motion. No tenderness.     Left wrist: Deformity, tenderness and bony tenderness present. No snuff box tenderness or crepitus. Decreased range of motion. Normal pulse.  Skin:    General: Skin is warm and dry.       Neurological:     Mental Status: She is alert and oriented to person, place, and time.     Cranial Nerves: No cranial nerve deficit.      ED Results / Procedures / Treatments   Labs (all labs ordered are listed, but only abnormal results are displayed) Labs Reviewed - No data to display  EKG None  Radiology DG Wrist Complete Left  Result Date: 05/15/2020 CLINICAL DATA:  87 year old female status post unwitnessed fall with pain. EXAM: LEFT WRIST - COMPLETE 3+ VIEW COMPARISON:  None. FINDINGS: Comminuted and mild dorsally impacted distal left radius fracture. Mild dorsal angulation. DRU involvement. No definite radiocarpal joint involvement.  Minimally displaced ulnar styloid fracture. Carpal bone alignment maintained. The scaphoid appears intact. There is chronic osteoarthritis with joint space loss and subchondral sclerosis along the radial carpal bones. Visible metacarpals appear intact. Calcified peripheral vascular  disease. IMPRESSION: 1. Comminuted and mild dorsally impacted distal left radius fracture with DRU involvement. 2. Minimally displaced ulnar styloid fracture. Electronically Signed   By: Genevie Ann M.D.   On: 05/15/2020 19:33    Procedures .Ortho Injury Treatment  Date/Time: 05/15/2020 8:00 PM Performed by: Carmin Muskrat, MD Authorized by: Carmin Muskrat, MD   Consent:    Consent obtained:  Verbal   Consent given by:  Patient   Risks discussed:  Fracture   Alternatives discussed:  Alternative treatment Universal protocol:    Procedure explained and questions answered to patient or proxy's satisfaction: yes     Relevant documents present and verified: yes     Test results available and properly labeled: yes     Imaging studies available: yes     Required blood products, implants, devices, and special equipment available: yes     Immediately prior to procedure a time out was called: yes     Patient identity confirmed:  Verbally with patientInjury location: wrist Location details: left wrist Injury type: fracture Fracture type: distal radius and ulnar styloid Pre-procedure neurovascular assessment: neurovascularly intact Pre-procedure distal perfusion: normal Pre-procedure neurological function: normal Pre-procedure range of motion: reduced  Anesthesia: Local anesthesia used: no  Patient sedated: NoManipulation performed: no Immobilization: splint Splint type: sugar tong Supplies used: Ortho-Glass Post-procedure neurovascular assessment: post-procedure neurovascularly intact Post-procedure distal perfusion: normal Post-procedure neurological function: normal Post-procedure range of motion:  unchanged Patient tolerance: patient tolerated the procedure well with no immediate complications    (including critical care time)  Medications Ordered in ED Medications - No data to display  ED Course  I have reviewed the triage vital signs and the nursing notes.  Pertinent labs & imaging results that were available during my care of the patient were reviewed by me and considered in my medical decision making (see chart for details).     On repeat exam patient is in no distress.  Line she is in aware of the need for splinting of her left wrist fracture.  9:17 PM Patient accompanied by her son.  On we discussed today's x-ray, patient's evaluation, splint which has already been applied, and is well-tolerated, with good distal perfusion in all digits. Also discussed the patient's case with our orthopedic doctor, Dr. Fredna Dow.   MDM Rules/Calculators/A&P MDM Number of Diagnoses or Management Options Closed fracture of left wrist, initial encounter: new, needed workup Fall, initial encounter: new, needed workup   Amount and/or Complexity of Data Reviewed Tests in the radiology section of CPT: reviewed Decide to obtain previous medical records or to obtain history from someone other than the patient: yes Obtain history from someone other than the patient: yes Review and summarize past medical records: yes Discuss the patient with other providers: yes Independent visualization of images, tracings, or specimens: yes  Risk of Complications, Morbidity, and/or Mortality Presenting problems: high Diagnostic procedures: high Management options: high  Critical Care Total time providing critical care: < 30 minutes  Patient Progress Patient progress: stable  Final Clinical Impression(s) / ED Diagnoses Final diagnoses:  Fall, initial encounter  Closed fracture of left wrist, initial encounter     Carmin Muskrat, MD 05/15/20 2118

## 2020-06-11 DIAGNOSIS — Z23 Encounter for immunization: Secondary | ICD-10-CM | POA: Diagnosis not present

## 2020-06-17 ENCOUNTER — Emergency Department (HOSPITAL_COMMUNITY): Payer: Medicare Other

## 2020-06-17 ENCOUNTER — Inpatient Hospital Stay (HOSPITAL_COMMUNITY)
Admission: EM | Admit: 2020-06-17 | Discharge: 2020-06-21 | DRG: 565 | Disposition: A | Discharge status: Skilled Nursing Facility | Payer: Medicare Other | Attending: Internal Medicine | Admitting: Internal Medicine

## 2020-06-17 ENCOUNTER — Encounter (HOSPITAL_COMMUNITY): Payer: Self-pay

## 2020-06-17 DIAGNOSIS — N179 Acute kidney failure, unspecified: Secondary | ICD-10-CM | POA: Diagnosis not present

## 2020-06-17 DIAGNOSIS — Y92030 Kitchen in apartment as the place of occurrence of the external cause: Secondary | ICD-10-CM

## 2020-06-17 DIAGNOSIS — N1831 Chronic kidney disease, stage 3a: Secondary | ICD-10-CM | POA: Diagnosis present

## 2020-06-17 DIAGNOSIS — I130 Hypertensive heart and chronic kidney disease with heart failure and stage 1 through stage 4 chronic kidney disease, or unspecified chronic kidney disease: Secondary | ICD-10-CM | POA: Diagnosis present

## 2020-06-17 DIAGNOSIS — Z952 Presence of prosthetic heart valve: Secondary | ICD-10-CM | POA: Diagnosis not present

## 2020-06-17 DIAGNOSIS — F0390 Unspecified dementia without behavioral disturbance: Secondary | ICD-10-CM | POA: Diagnosis present

## 2020-06-17 DIAGNOSIS — K5641 Fecal impaction: Secondary | ICD-10-CM | POA: Diagnosis present

## 2020-06-17 DIAGNOSIS — Z20822 Contact with and (suspected) exposure to covid-19: Secondary | ICD-10-CM | POA: Diagnosis present

## 2020-06-17 DIAGNOSIS — Z886 Allergy status to analgesic agent status: Secondary | ICD-10-CM

## 2020-06-17 DIAGNOSIS — K219 Gastro-esophageal reflux disease without esophagitis: Secondary | ICD-10-CM | POA: Diagnosis present

## 2020-06-17 DIAGNOSIS — J849 Interstitial pulmonary disease, unspecified: Secondary | ICD-10-CM | POA: Diagnosis present

## 2020-06-17 DIAGNOSIS — I5033 Acute on chronic diastolic (congestive) heart failure: Secondary | ICD-10-CM | POA: Diagnosis not present

## 2020-06-17 DIAGNOSIS — Z7901 Long term (current) use of anticoagulants: Secondary | ICD-10-CM

## 2020-06-17 DIAGNOSIS — R001 Bradycardia, unspecified: Secondary | ICD-10-CM | POA: Diagnosis present

## 2020-06-17 DIAGNOSIS — Z833 Family history of diabetes mellitus: Secondary | ICD-10-CM | POA: Diagnosis not present

## 2020-06-17 DIAGNOSIS — E119 Type 2 diabetes mellitus without complications: Secondary | ICD-10-CM | POA: Diagnosis not present

## 2020-06-17 DIAGNOSIS — E559 Vitamin D deficiency, unspecified: Secondary | ICD-10-CM | POA: Diagnosis present

## 2020-06-17 DIAGNOSIS — I2510 Atherosclerotic heart disease of native coronary artery without angina pectoris: Secondary | ICD-10-CM | POA: Diagnosis present

## 2020-06-17 DIAGNOSIS — K2960 Other gastritis without bleeding: Secondary | ICD-10-CM | POA: Diagnosis present

## 2020-06-17 DIAGNOSIS — E1122 Type 2 diabetes mellitus with diabetic chronic kidney disease: Secondary | ICD-10-CM | POA: Diagnosis present

## 2020-06-17 DIAGNOSIS — Z953 Presence of xenogenic heart valve: Secondary | ICD-10-CM

## 2020-06-17 DIAGNOSIS — Z79899 Other long term (current) drug therapy: Secondary | ICD-10-CM

## 2020-06-17 DIAGNOSIS — T796XXA Traumatic ischemia of muscle, initial encounter: Secondary | ICD-10-CM | POA: Diagnosis present

## 2020-06-17 DIAGNOSIS — E872 Acidosis, unspecified: Secondary | ICD-10-CM | POA: Diagnosis present

## 2020-06-17 DIAGNOSIS — S0990XA Unspecified injury of head, initial encounter: Secondary | ICD-10-CM | POA: Diagnosis not present

## 2020-06-17 DIAGNOSIS — I959 Hypotension, unspecified: Secondary | ICD-10-CM | POA: Diagnosis not present

## 2020-06-17 DIAGNOSIS — W19XXXD Unspecified fall, subsequent encounter: Secondary | ICD-10-CM | POA: Diagnosis not present

## 2020-06-17 DIAGNOSIS — E0590 Thyrotoxicosis, unspecified without thyrotoxic crisis or storm: Secondary | ICD-10-CM | POA: Diagnosis present

## 2020-06-17 DIAGNOSIS — S199XXA Unspecified injury of neck, initial encounter: Secondary | ICD-10-CM | POA: Diagnosis not present

## 2020-06-17 DIAGNOSIS — Z8249 Family history of ischemic heart disease and other diseases of the circulatory system: Secondary | ICD-10-CM

## 2020-06-17 DIAGNOSIS — I447 Left bundle-branch block, unspecified: Secondary | ICD-10-CM | POA: Diagnosis present

## 2020-06-17 DIAGNOSIS — Z043 Encounter for examination and observation following other accident: Secondary | ICD-10-CM | POA: Diagnosis not present

## 2020-06-17 DIAGNOSIS — I63439 Cerebral infarction due to embolism of unspecified posterior cerebral artery: Secondary | ICD-10-CM | POA: Diagnosis not present

## 2020-06-17 DIAGNOSIS — M6282 Rhabdomyolysis: Secondary | ICD-10-CM | POA: Diagnosis present

## 2020-06-17 DIAGNOSIS — W1830XA Fall on same level, unspecified, initial encounter: Secondary | ICD-10-CM | POA: Diagnosis present

## 2020-06-17 DIAGNOSIS — R4182 Altered mental status, unspecified: Secondary | ICD-10-CM | POA: Diagnosis not present

## 2020-06-17 DIAGNOSIS — Z8673 Personal history of transient ischemic attack (TIA), and cerebral infarction without residual deficits: Secondary | ICD-10-CM

## 2020-06-17 DIAGNOSIS — E1165 Type 2 diabetes mellitus with hyperglycemia: Secondary | ICD-10-CM | POA: Diagnosis present

## 2020-06-17 DIAGNOSIS — Z66 Do not resuscitate: Secondary | ICD-10-CM | POA: Diagnosis present

## 2020-06-17 DIAGNOSIS — Z7401 Bed confinement status: Secondary | ICD-10-CM | POA: Diagnosis not present

## 2020-06-17 DIAGNOSIS — R404 Transient alteration of awareness: Secondary | ICD-10-CM | POA: Diagnosis not present

## 2020-06-17 DIAGNOSIS — M810 Age-related osteoporosis without current pathological fracture: Secondary | ICD-10-CM | POA: Diagnosis present

## 2020-06-17 DIAGNOSIS — R52 Pain, unspecified: Secondary | ICD-10-CM | POA: Diagnosis not present

## 2020-06-17 DIAGNOSIS — N183 Chronic kidney disease, stage 3 unspecified: Secondary | ICD-10-CM | POA: Diagnosis present

## 2020-06-17 DIAGNOSIS — W19XXXA Unspecified fall, initial encounter: Secondary | ICD-10-CM

## 2020-06-17 DIAGNOSIS — Z9181 History of falling: Secondary | ICD-10-CM

## 2020-06-17 DIAGNOSIS — D72829 Elevated white blood cell count, unspecified: Secondary | ICD-10-CM | POA: Diagnosis not present

## 2020-06-17 DIAGNOSIS — I5032 Chronic diastolic (congestive) heart failure: Secondary | ICD-10-CM | POA: Diagnosis present

## 2020-06-17 DIAGNOSIS — T68XXXA Hypothermia, initial encounter: Secondary | ICD-10-CM | POA: Diagnosis not present

## 2020-06-17 DIAGNOSIS — Z880 Allergy status to penicillin: Secondary | ICD-10-CM

## 2020-06-17 DIAGNOSIS — E1169 Type 2 diabetes mellitus with other specified complication: Secondary | ICD-10-CM

## 2020-06-17 DIAGNOSIS — I10 Essential (primary) hypertension: Secondary | ICD-10-CM | POA: Diagnosis present

## 2020-06-17 DIAGNOSIS — I4819 Other persistent atrial fibrillation: Secondary | ICD-10-CM | POA: Diagnosis not present

## 2020-06-17 DIAGNOSIS — R739 Hyperglycemia, unspecified: Secondary | ICD-10-CM

## 2020-06-17 DIAGNOSIS — M2550 Pain in unspecified joint: Secondary | ICD-10-CM | POA: Diagnosis not present

## 2020-06-17 DIAGNOSIS — I4891 Unspecified atrial fibrillation: Secondary | ICD-10-CM | POA: Diagnosis not present

## 2020-06-17 LAB — BETA-HYDROXYBUTYRIC ACID: Beta-Hydroxybutyric Acid: 1.07 mmol/L — ABNORMAL HIGH (ref 0.05–0.27)

## 2020-06-17 LAB — COMPREHENSIVE METABOLIC PANEL
ALT: 20 U/L (ref 0–44)
AST: 51 U/L — ABNORMAL HIGH (ref 15–41)
Albumin: 3.2 g/dL — ABNORMAL LOW (ref 3.5–5.0)
Alkaline Phosphatase: 94 U/L (ref 38–126)
Anion gap: 18 — ABNORMAL HIGH (ref 5–15)
BUN: 33 mg/dL — ABNORMAL HIGH (ref 8–23)
CO2: 19 mmol/L — ABNORMAL LOW (ref 22–32)
Calcium: 8.7 mg/dL — ABNORMAL LOW (ref 8.9–10.3)
Chloride: 100 mmol/L (ref 98–111)
Creatinine, Ser: 2.17 mg/dL — ABNORMAL HIGH (ref 0.44–1.00)
GFR, Estimated: 20 mL/min — ABNORMAL LOW (ref >60)
Glucose, Bld: 449 mg/dL — ABNORMAL HIGH (ref 70–99)
Potassium: 4.4 mmol/L (ref 3.5–5.1)
Sodium: 137 mmol/L (ref 135–145)
Total Bilirubin: 1.5 mg/dL — ABNORMAL HIGH (ref 0.3–1.2)
Total Protein: 5.8 g/dL — ABNORMAL LOW (ref 6.5–8.1)

## 2020-06-17 LAB — I-STAT VENOUS BLOOD GAS, ED
Acid-base deficit: 8 mmol/L — ABNORMAL HIGH (ref 0.0–2.0)
Bicarbonate: 18.9 mmol/L — ABNORMAL LOW (ref 20.0–28.0)
Calcium, Ion: 1.07 mmol/L — ABNORMAL LOW (ref 1.15–1.40)
HCT: 36 % (ref 36.0–46.0)
Hemoglobin: 12.2 g/dL (ref 12.0–15.0)
O2 Saturation: 81 %
Potassium: 3.9 mmol/L (ref 3.5–5.1)
Sodium: 139 mmol/L (ref 135–145)
TCO2: 20 mmol/L — ABNORMAL LOW (ref 22–32)
pCO2, Ven: 42.5 mmHg — ABNORMAL LOW (ref 44.0–60.0)
pH, Ven: 7.255 (ref 7.250–7.430)
pO2, Ven: 52 mmHg — ABNORMAL HIGH (ref 32.0–45.0)

## 2020-06-17 LAB — LACTIC ACID, PLASMA
Lactic Acid, Venous: 6.9 mmol/L (ref 0.5–1.9)
Lactic Acid, Venous: 8.3 mmol/L (ref 0.5–1.9)

## 2020-06-17 LAB — CBC WITH DIFFERENTIAL/PLATELET
Abs Immature Granulocytes: 0.27 10*3/uL — ABNORMAL HIGH (ref 0.00–0.07)
Basophils Absolute: 0.1 10*3/uL (ref 0.0–0.1)
Basophils Relative: 0 %
Eosinophils Absolute: 0 10*3/uL (ref 0.0–0.5)
Eosinophils Relative: 0 %
HCT: 37.4 % (ref 36.0–46.0)
Hemoglobin: 12 g/dL (ref 12.0–15.0)
Immature Granulocytes: 1 %
Lymphocytes Relative: 2 %
Lymphs Abs: 0.5 10*3/uL — ABNORMAL LOW (ref 0.7–4.0)
MCH: 31.9 pg (ref 26.0–34.0)
MCHC: 32.1 g/dL (ref 30.0–36.0)
MCV: 99.5 fL (ref 80.0–100.0)
Monocytes Absolute: 1.3 10*3/uL — ABNORMAL HIGH (ref 0.1–1.0)
Monocytes Relative: 6 %
Neutro Abs: 19.4 10*3/uL — ABNORMAL HIGH (ref 1.7–7.7)
Neutrophils Relative %: 91 %
Platelets: 235 10*3/uL (ref 150–400)
RBC: 3.76 MIL/uL — ABNORMAL LOW (ref 3.87–5.11)
RDW: 13.2 % (ref 11.5–15.5)
WBC: 21.5 10*3/uL — ABNORMAL HIGH (ref 4.0–10.5)
nRBC: 0 % (ref 0.0–0.2)

## 2020-06-17 LAB — URINALYSIS, ROUTINE W REFLEX MICROSCOPIC
Bilirubin Urine: NEGATIVE
Bilirubin Urine: NEGATIVE
Glucose, UA: >=500 mg/dL — AB
Glucose, UA: 150 mg/dL — AB
Hgb urine dipstick: NEGATIVE
Hgb urine dipstick: NEGATIVE
Ketones, ur: 5 mg/dL — AB
Ketones, ur: 5 mg/dL — AB
Leukocytes,Ua: NEGATIVE
Nitrite: NEGATIVE
Nitrite: NEGATIVE
Protein, ur: 30 mg/dL — AB
Protein, ur: 30 mg/dL — AB
Specific Gravity, Urine: 1.015 (ref 1.005–1.030)
Specific Gravity, Urine: 1.019 (ref 1.005–1.030)
pH: 5 (ref 5.0–8.0)
pH: 5 (ref 5.0–8.0)

## 2020-06-17 LAB — CBG MONITORING, ED
Glucose-Capillary: 365 mg/dL — ABNORMAL HIGH (ref 70–99)
Glucose-Capillary: 392 mg/dL — ABNORMAL HIGH (ref 70–99)
Glucose-Capillary: 341 mg/dL — ABNORMAL HIGH (ref 70–99)
Glucose-Capillary: 354 mg/dL — ABNORMAL HIGH (ref 70–99)

## 2020-06-17 LAB — CK
Total CK: 1219 U/L — ABNORMAL HIGH (ref 38–234)
Total CK: 1220 U/L — ABNORMAL HIGH (ref 38–234)

## 2020-06-17 LAB — BASIC METABOLIC PANEL
Anion gap: 15 (ref 5–15)
BUN: 34 mg/dL — ABNORMAL HIGH (ref 8–23)
CO2: 20 mmol/L — ABNORMAL LOW (ref 22–32)
Calcium: 8.6 mg/dL — ABNORMAL LOW (ref 8.9–10.3)
Chloride: 103 mmol/L (ref 98–111)
Creatinine, Ser: 2.28 mg/dL — ABNORMAL HIGH (ref 0.44–1.00)
GFR, Estimated: 19 mL/min — ABNORMAL LOW (ref >60)
Glucose, Bld: 384 mg/dL — ABNORMAL HIGH (ref 70–99)
Potassium: 4.3 mmol/L (ref 3.5–5.1)
Sodium: 138 mmol/L (ref 135–145)

## 2020-06-17 LAB — HEMOGLOBIN A1C
Hgb A1c MFr Bld: 7.3 % — ABNORMAL HIGH (ref 4.8–5.6)
Mean Plasma Glucose: 162.81 mg/dL

## 2020-06-17 LAB — RESPIRATORY PANEL BY RT PCR (FLU A&B, COVID)
Influenza A by PCR: NEGATIVE
Influenza B by PCR: NEGATIVE
SARS Coronavirus 2 by RT PCR: NEGATIVE

## 2020-06-17 LAB — TSH: TSH: 2.144 u[IU]/mL (ref 0.350–4.500)

## 2020-06-17 IMAGING — CT CT HEAD W/O CM
5 of 8 series · 17 of 47 positions shown, 18 images · non-contrast
Comparison: CT of the head [DATE]

CLINICAL DATA: Post head trauma.

EXAM:
CT HEAD WITHOUT CONTRAST
TECHNIQUE: Contiguous axial images were obtained from the base of the skull
through the vertex without intravenous contrast.

[Series 3: head bone · axial · 0.43mm/px · z∈[-159,-31]mm · 8 of 81 slices shown]
[im 9/81  bone]
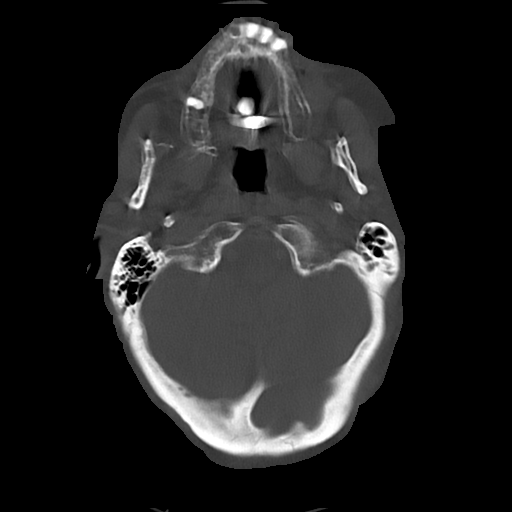
[im 17/81  bone]
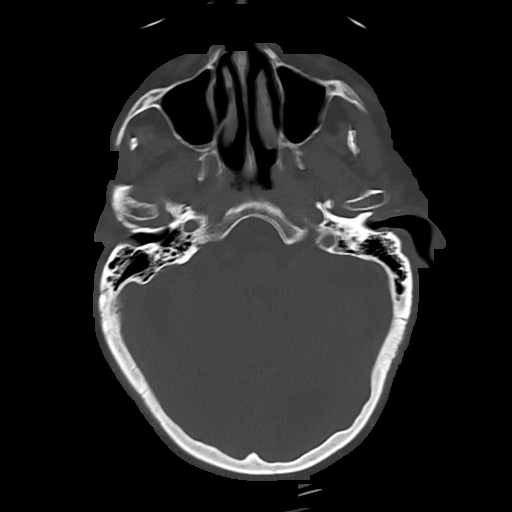
[im 25/81  bone]
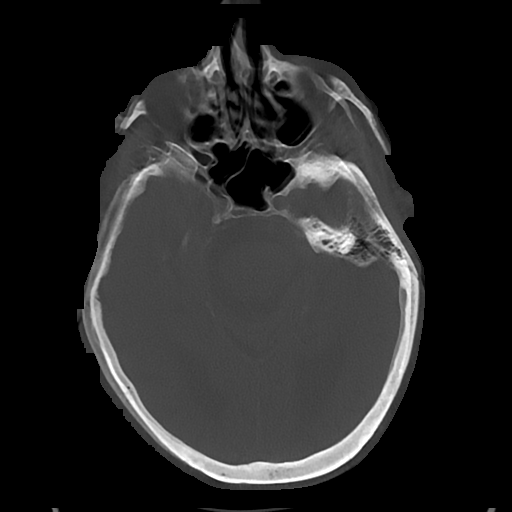
[im 33/81  bone]
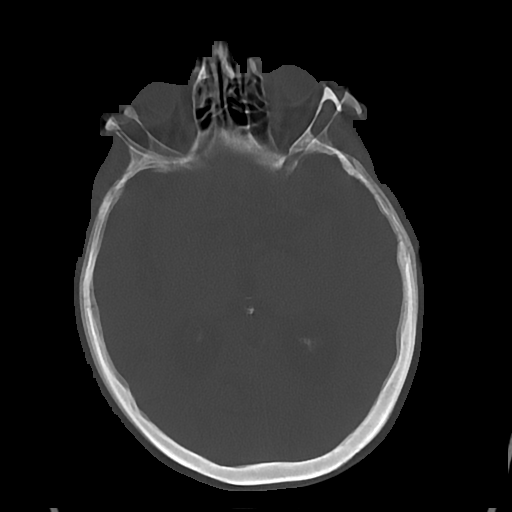
[im 49/81  bone]
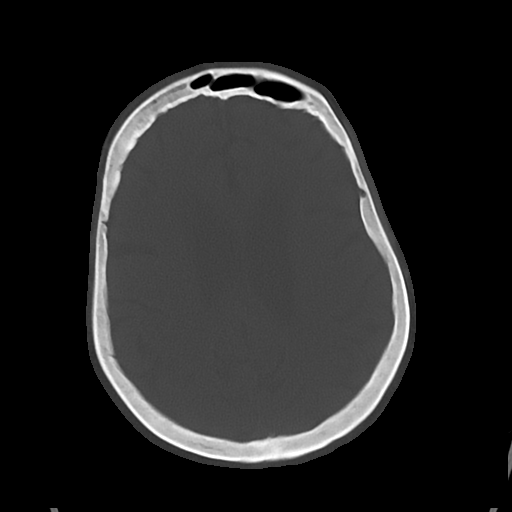
[im 57/81  bone]
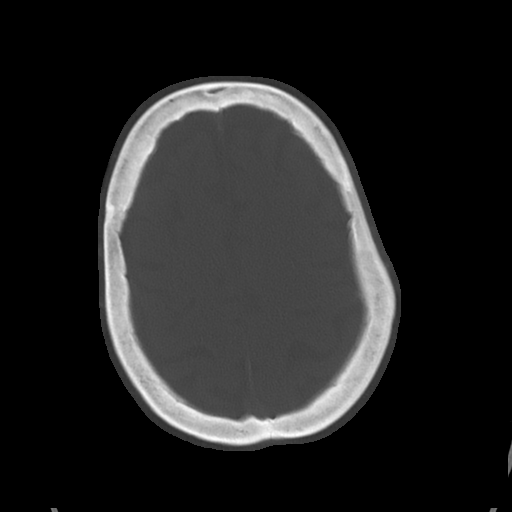
[im 65/81  bone]
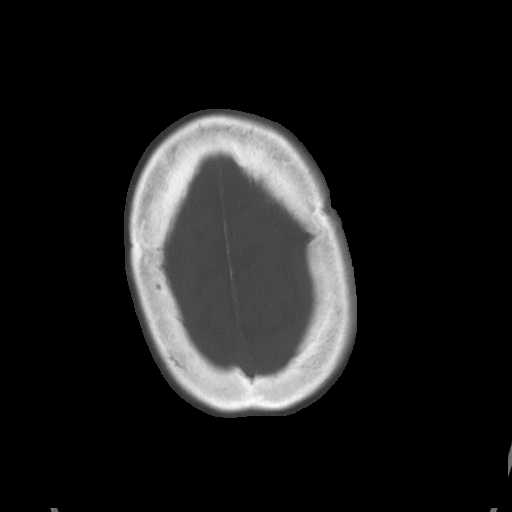
[im 73/81  bone]
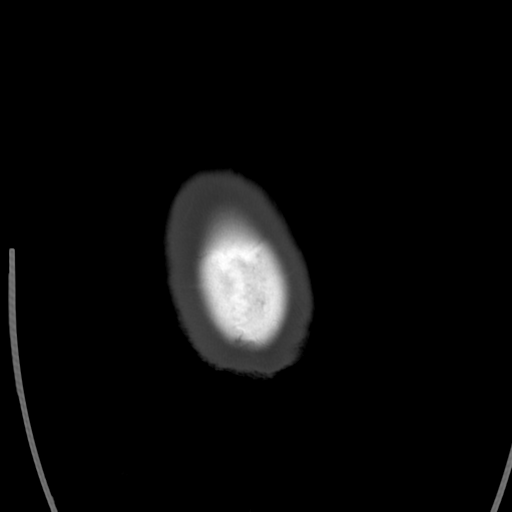

[Series 4: head wo · axial · 0.43mm/px · z∈[-125,-75]mm · 2 of 31 slices shown, 3 images (1 of 2)]
[im 11/31  brain]
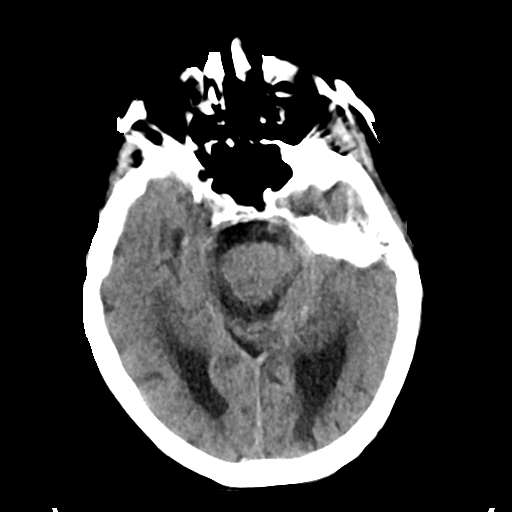
[im 11/31  bone]
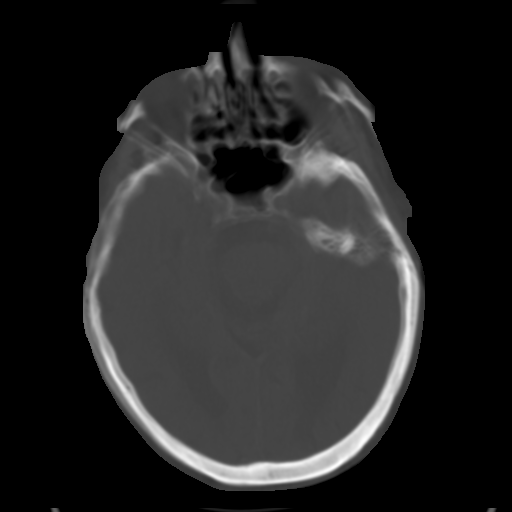
[im 21/31  brain]
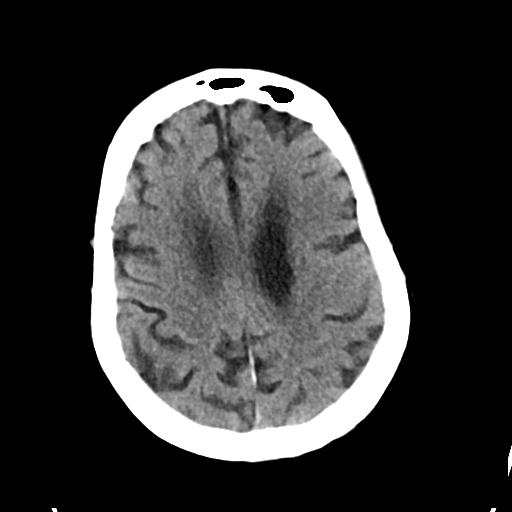

[Series 5: cor soft · coronal · 0.35mm/px · 3 of 69 slices shown]
[im 18/69  brain]
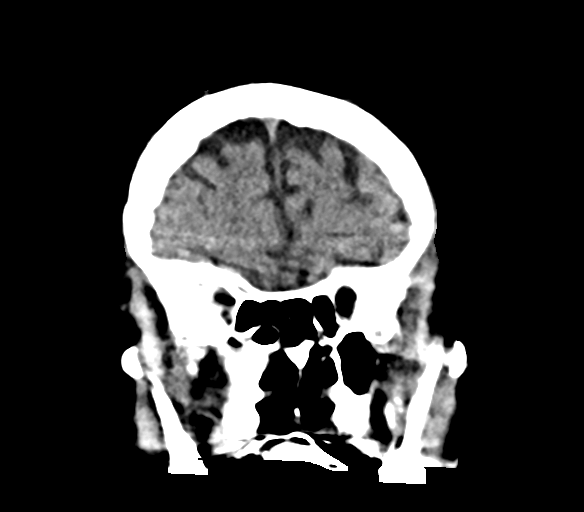
[im 35/69  brain]
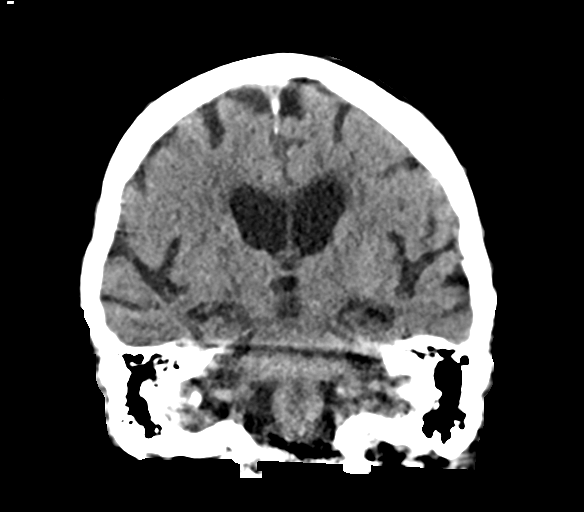
[im 52/69  brain]
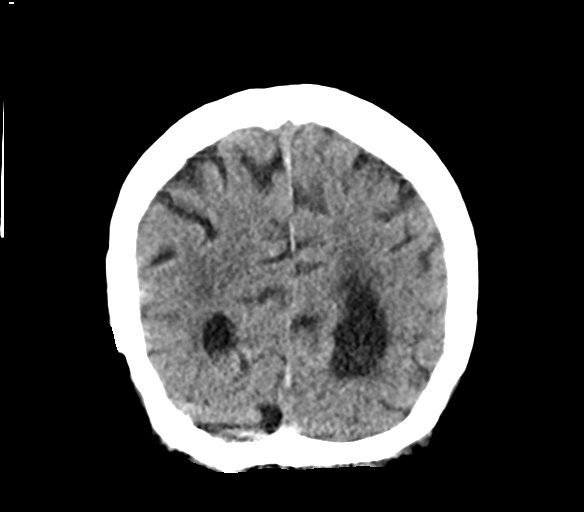

[Series 6: sag soft · sagittal · 0.34mm/px · 2 of 53 slices shown]
[im 18/53  brain]
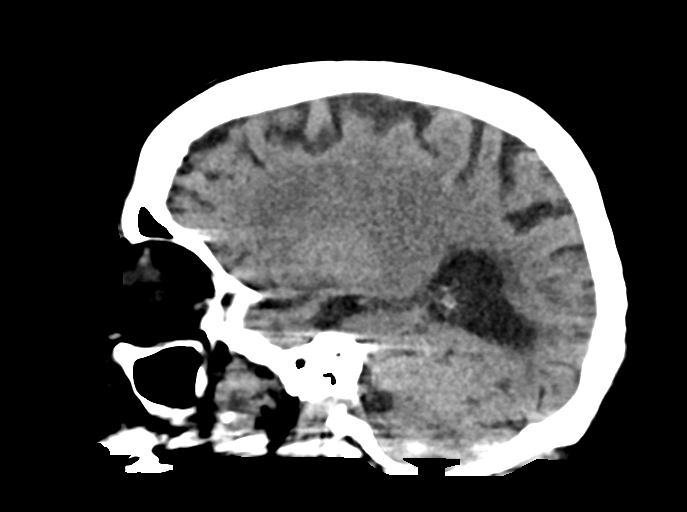
[im 35/53  brain]
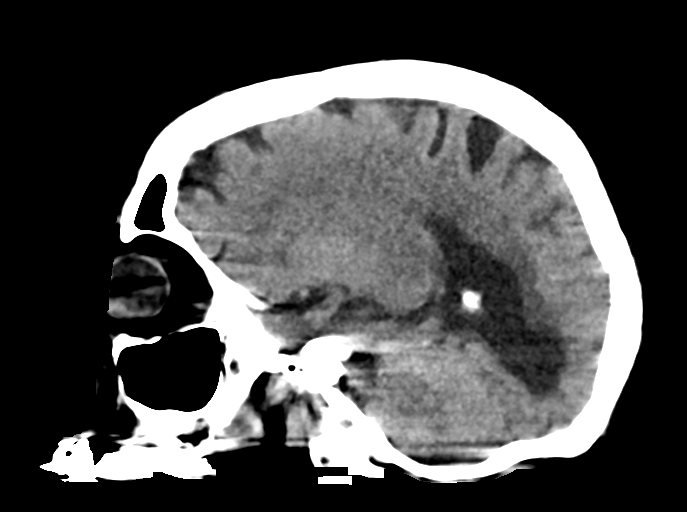

[Series 7: head wo · axial · 0.43mm/px · z∈[-125,-75]mm · 2 of 31 slices shown (2 of 2)]
[im 11/31  brain]
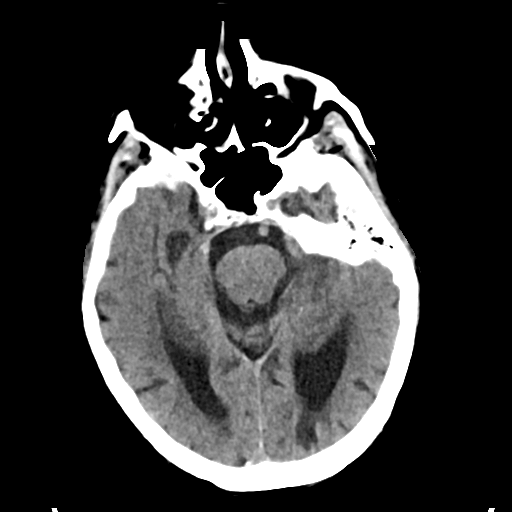
[im 21/31  brain]
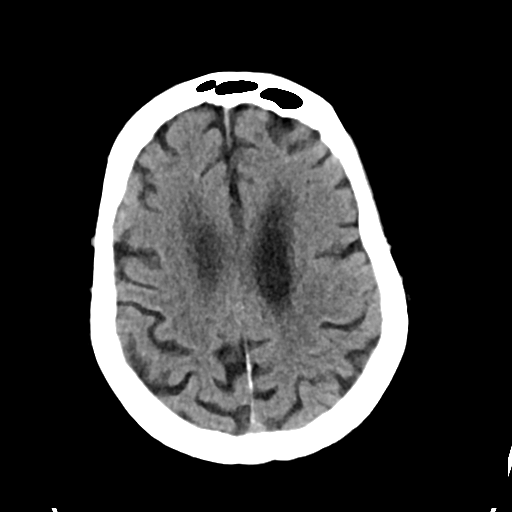

[17 of 47 positions shown; findings below may reference images not displayed]

FINDINGS: Brain: No evidence of acute infarction, hemorrhage, hydrocephalus,
extra-axial collection or mass lesion/mass effect. Insert full
malacia from chronic left occipital infarct. Advanced brain
parenchymal volume loss and moderate deep white matter
microangiopathy.

Vascular: Calcific atherosclerotic disease.

Skull: Normal. Negative for fracture or focal lesion.

Sinuses/Orbits: No acute finding.

Other: None.
IMPRESSION: 1. No acute intracranial abnormality.
2. Advanced brain parenchymal atrophy and chronic microvascular
disease.
3. Chronic left occipital infarct.

## 2020-06-17 IMAGING — DX DG CHEST 2V
2 series · 2 of 2 positions shown · non-contrast
Comparison: [DATE]

CLINICAL DATA: Status post fall.

EXAM:
CHEST - 2 VIEW

[chest lat]
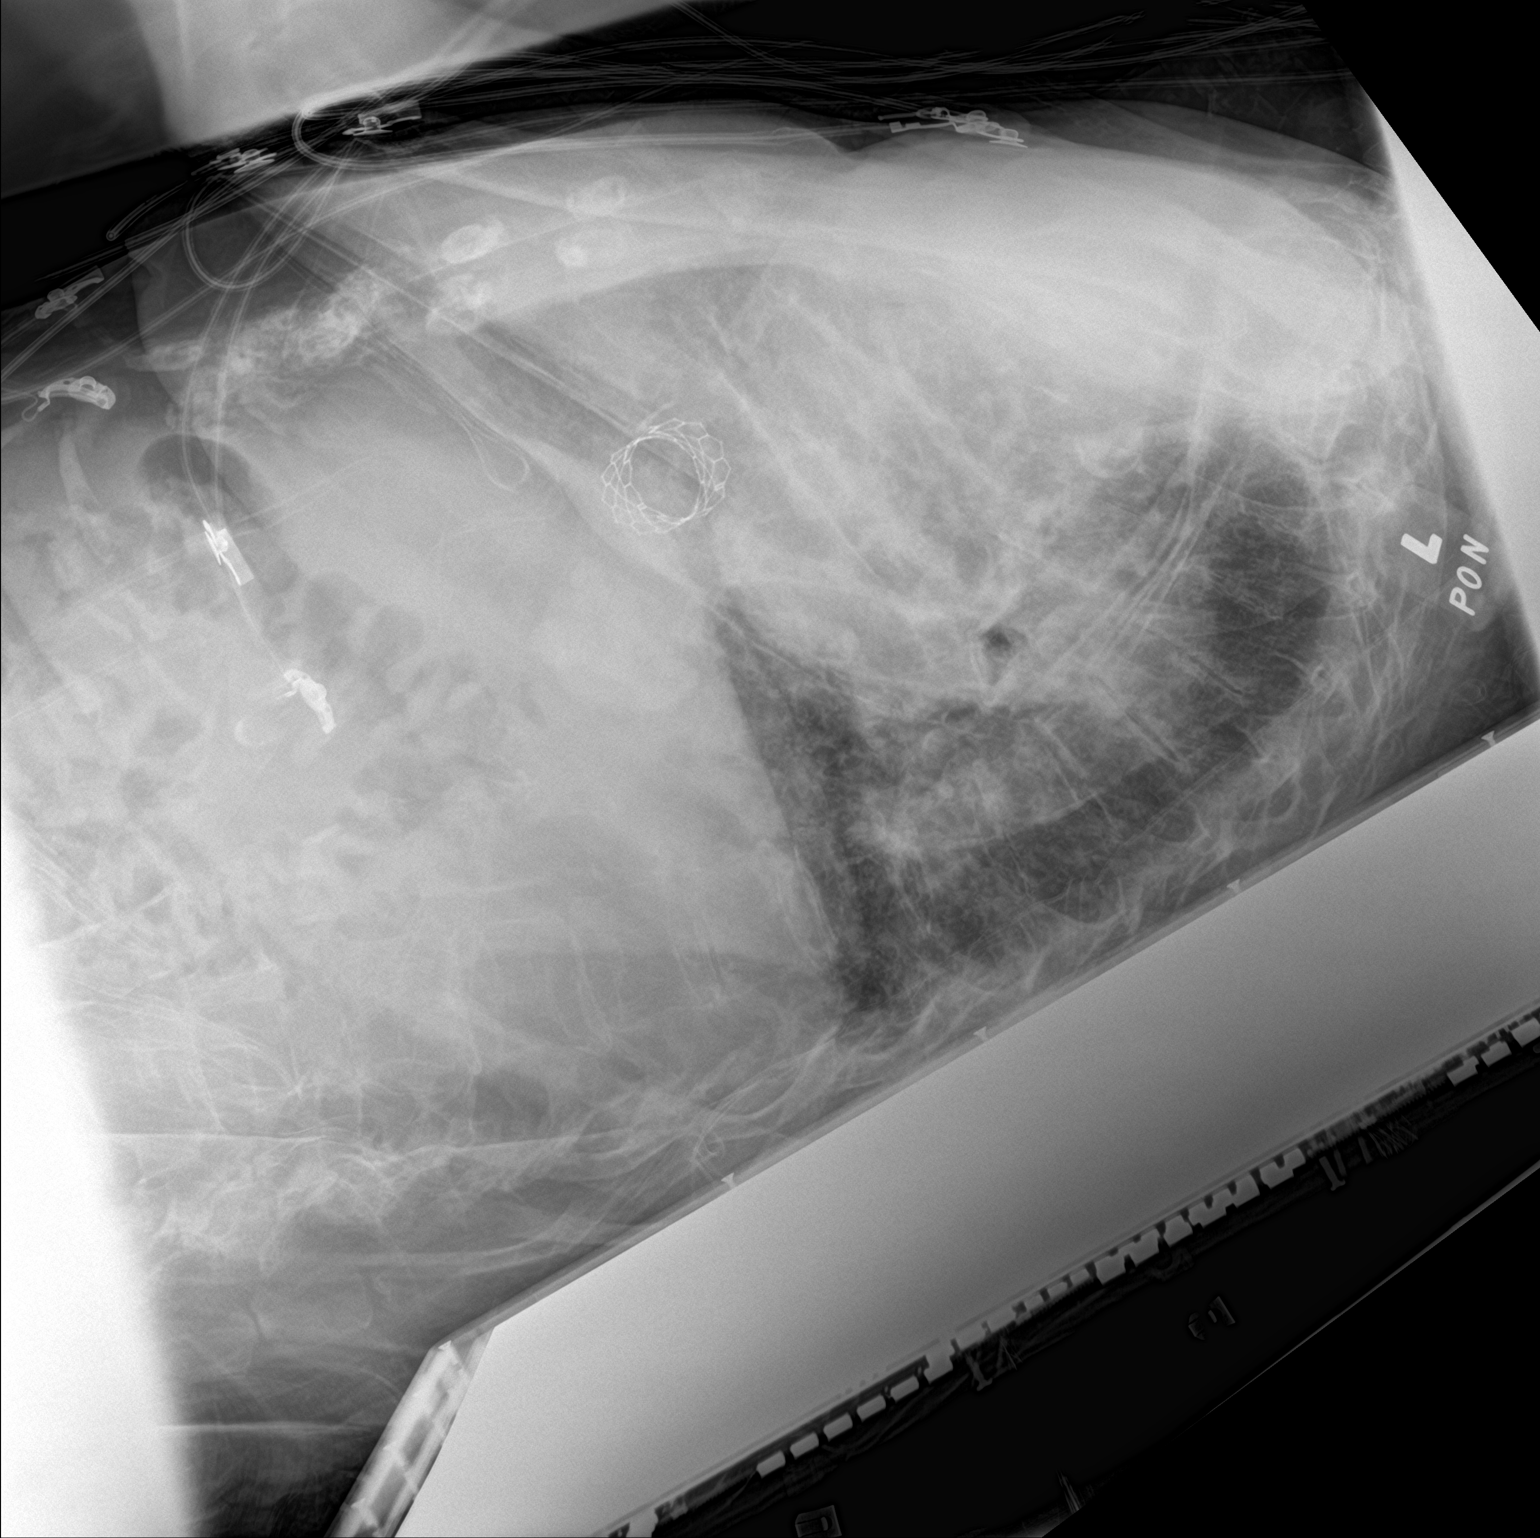

[chest ap]
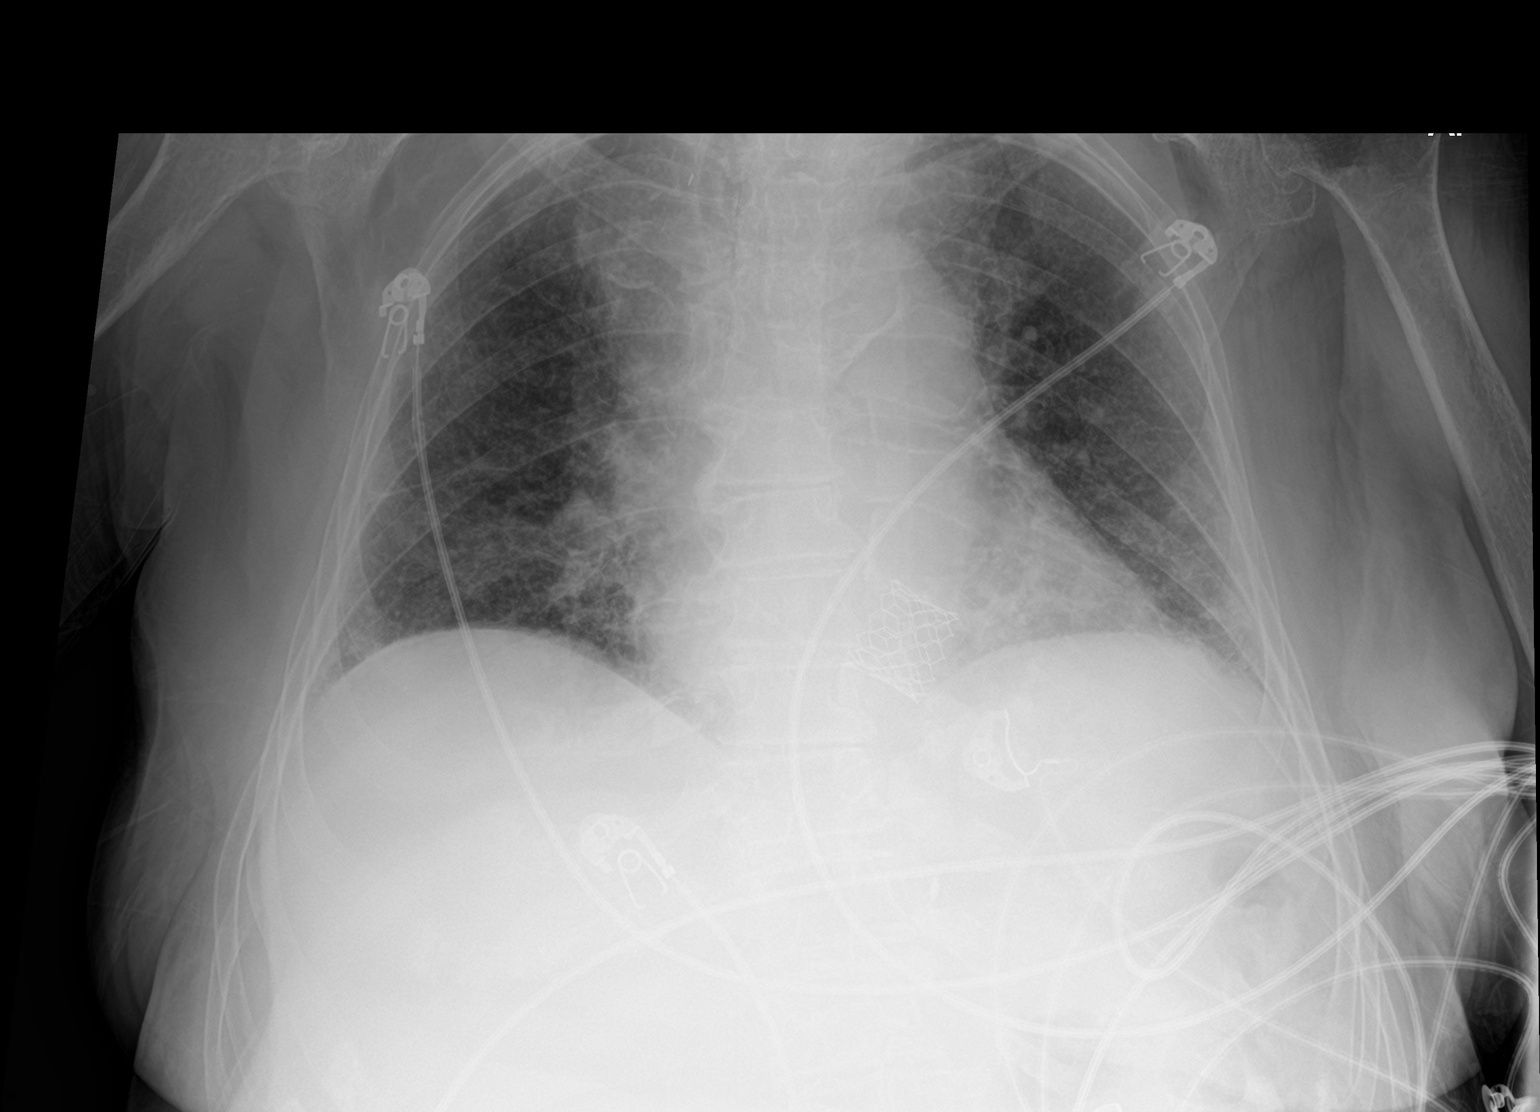

[2 of 2 positions shown; findings below may reference images not displayed]

FINDINGS: Enlarged cardiac silhouette. Calcific atherosclerotic disease and
tortuosity of the aorta. Stable postsurgical changes at the thoracic
inlet and aortic valve prosthesis noted.

There is no evidence of focal airspace consolidation, pleural
effusion or pneumothorax. Mild interstitial scarring as before.

Osseous structures are without acute abnormality. Soft tissues are
grossly normal.
IMPRESSION: 1. No active cardiopulmonary disease.
2. Enlarged cardiac silhouette.
3. Calcific atherosclerotic disease and tortuosity of the aorta.

## 2020-06-17 IMAGING — DX DG PELVIS 1-2V
1 series · 1 of 1 positions shown · non-contrast
Comparison: [DATE]

CLINICAL DATA: Fall

EXAM:
PELVIS - 1-2 VIEW

[pelvis ap]
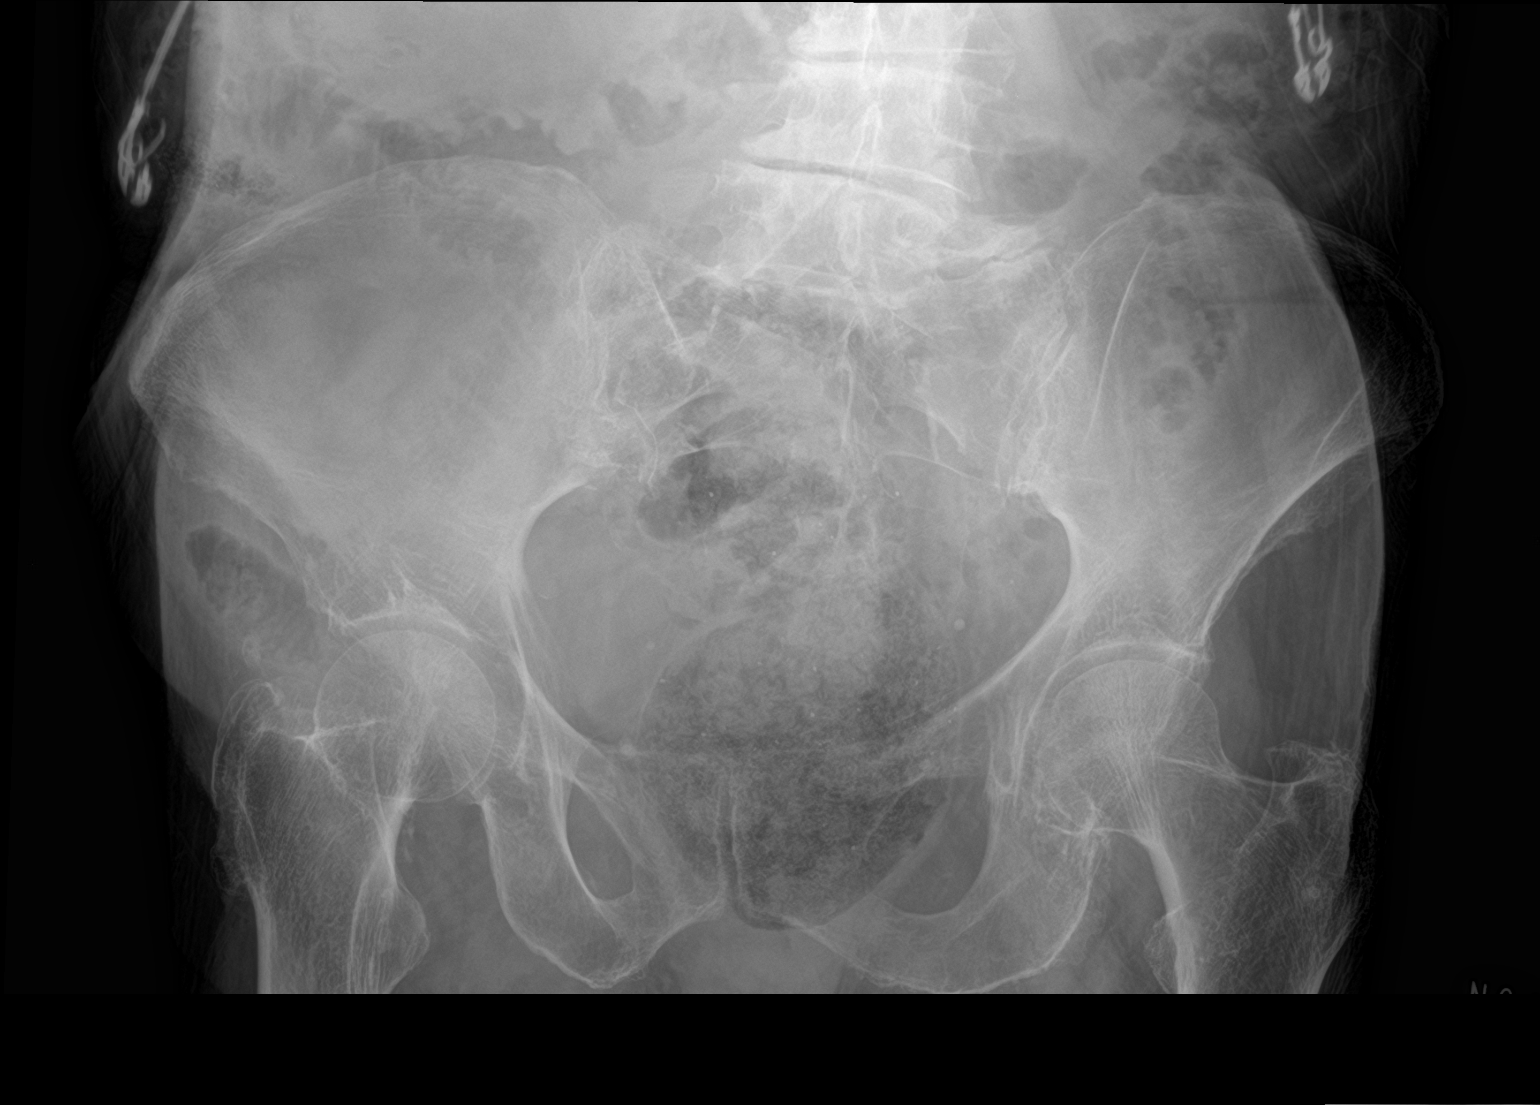

[1 of 1 positions shown; findings below may reference images not displayed]

FINDINGS: Osteopenia. No definitive fracture on this single radiograph. There
is rotation of the RIGHT hip. Evaluation of the pubic ramus is
limited secondary to large rectal stool ball. Assimilation joint of
LEFT L5-S1. Degenerative changes of the lower lumbar spine. Vascular
calcifications.
IMPRESSION: 1. No definitive fracture on this single radiograph. Recommend
additional dedicated radiographs of the areas of clinical concern
for improved evaluation.

## 2020-06-17 IMAGING — CT CT CERVICAL SPINE W/O CM
5 of 7 series · 16 of 35 positions shown, 17 images · non-contrast
Comparison: None.

CLINICAL DATA: Status post fall with neck trauma.

EXAM:
CT CERVICAL SPINE WITHOUT CONTRAST
TECHNIQUE: Multidetector CT imaging of the cervical spine was performed without
intravenous contrast. Multiplanar CT image reconstructions were also
generated.

[Series 6: c spine soft · axial · 0.29mm/px · z∈[-242,-154]mm · 3 of 88 slices shown (1 of 2)]
[im 22/88  soft-tissue]
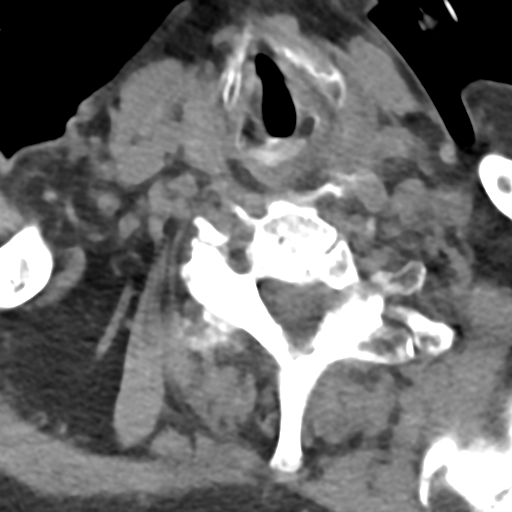
[im 44/88  soft-tissue]
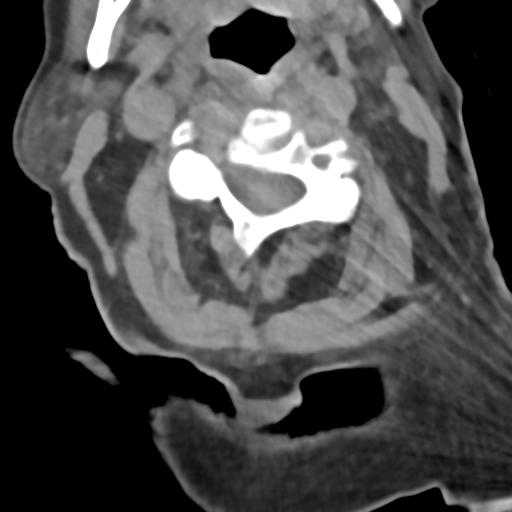
[im 66/88  soft-tissue]
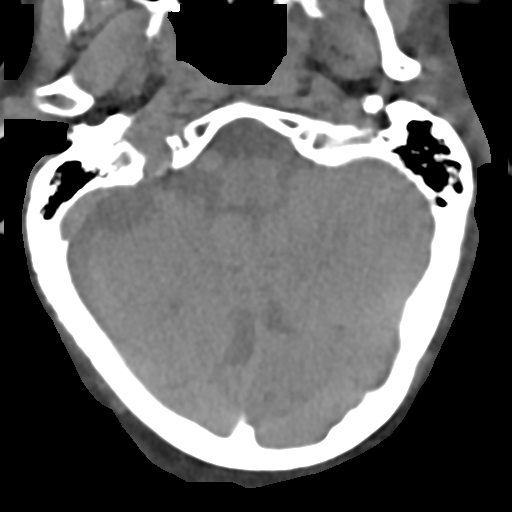

[Series 9: sag bone · coronal · 0.38mm/px · 2 of 67 slices shown (1 of 2)]
[im 23/67  bone]
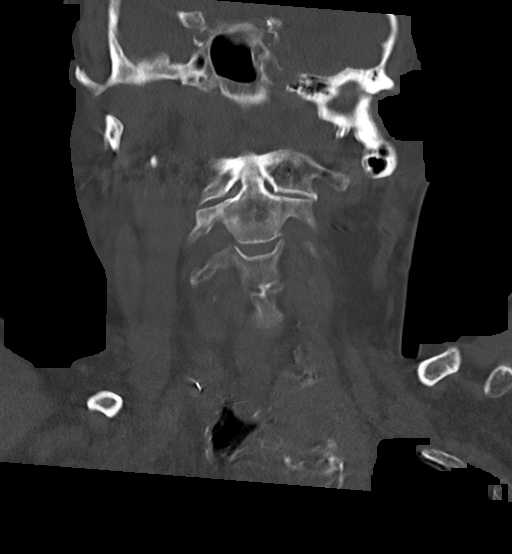
[im 45/67  bone]
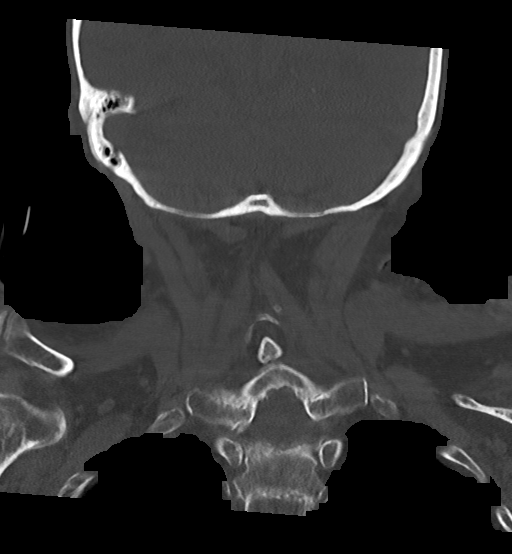

[Series 13: c spine soft · axial · 0.29mm/px · z∈[-226,-168]mm · 2 of 88 slices shown (2 of 2)]
[im 30/88  soft-tissue]
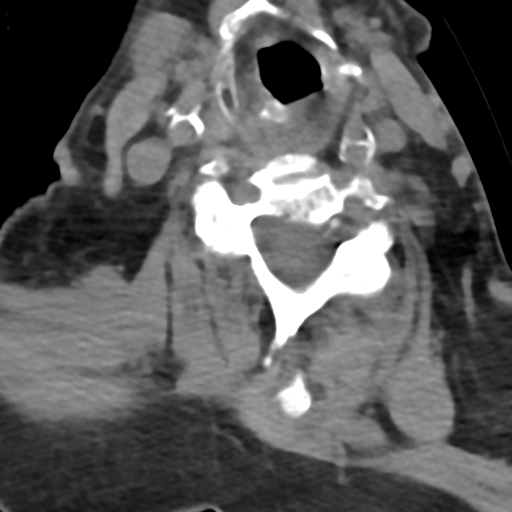
[im 59/88  soft-tissue]
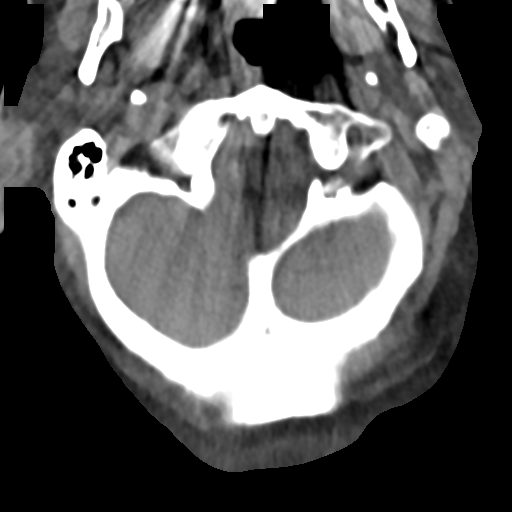

[Series 16: sag bone · sagittal · 0.41mm/px · 6 of 74 slices shown (2 of 2)]
[im 13/74  bone]
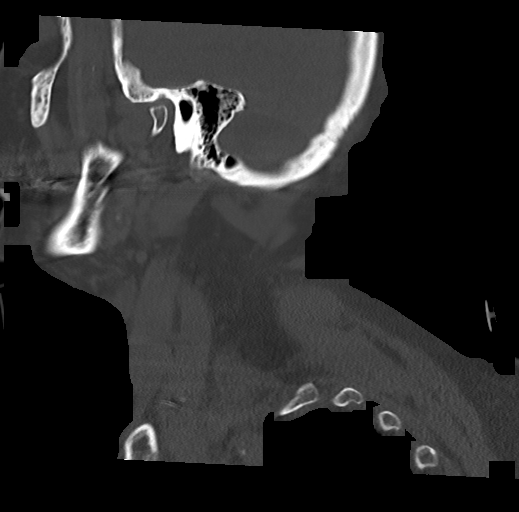
[im 25/74  bone]
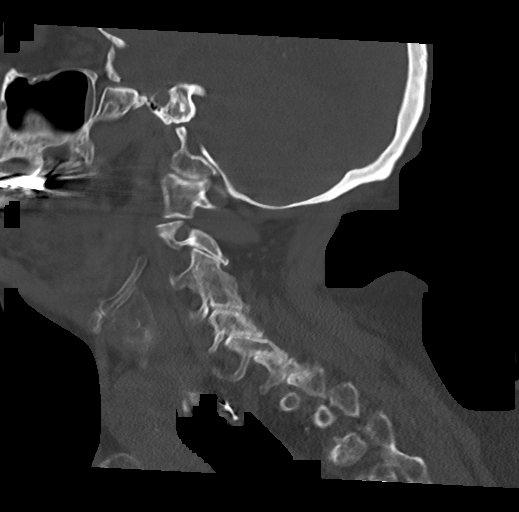
[im 33/74  soft-tissue]
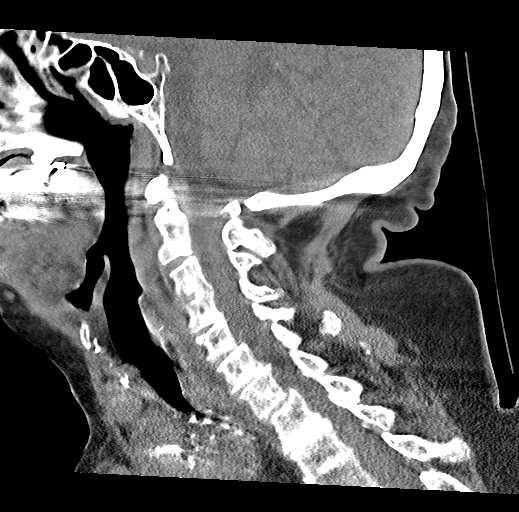
[im 37/74  bone]
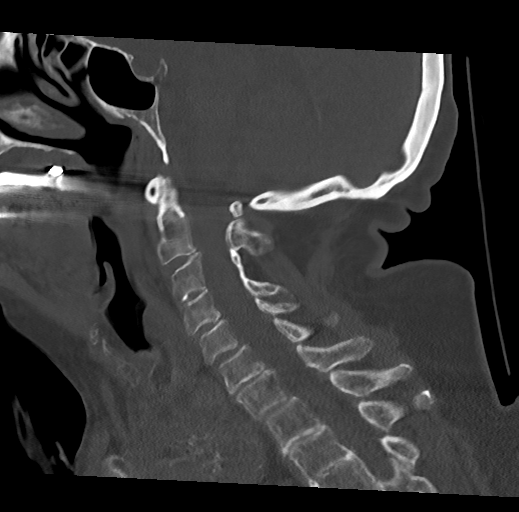
[im 49/74  bone]
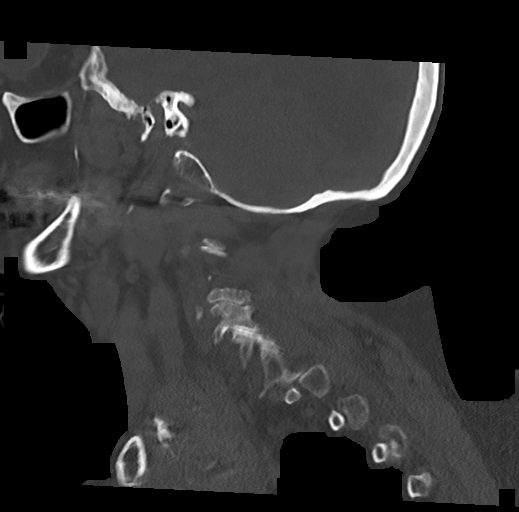
[im 61/74  bone]
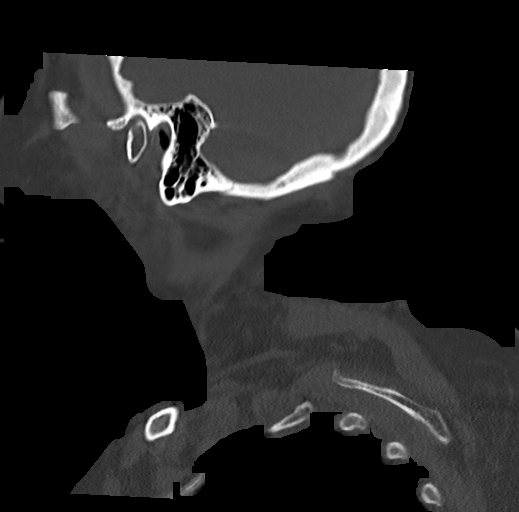

[Series 18: orthogonal axials · axial · 0.32mm/px · z∈[-302,-211]mm · 3 of 109 slices shown, 4 images]
[im 28/109  soft-tissue]
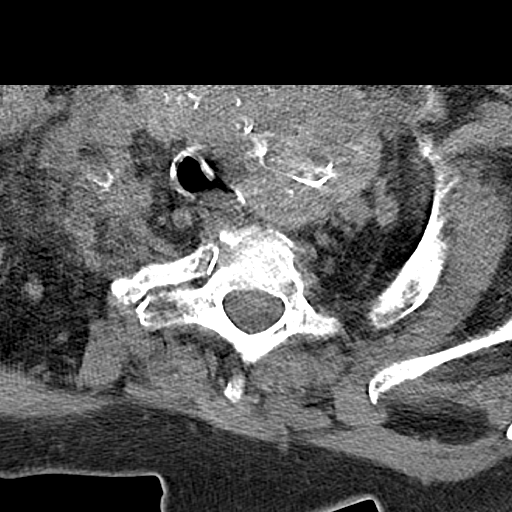
[im 28/109  bone]
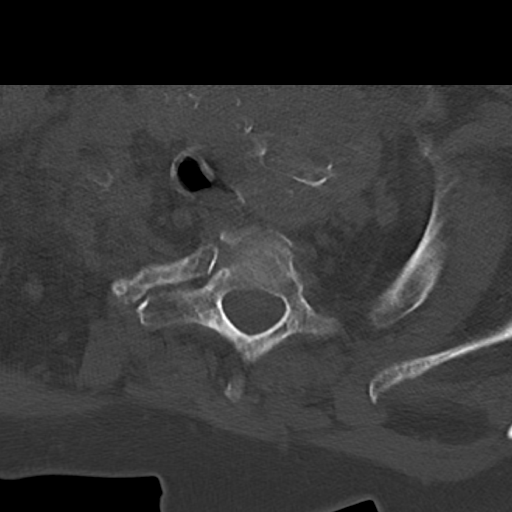
[im 55/109  bone]
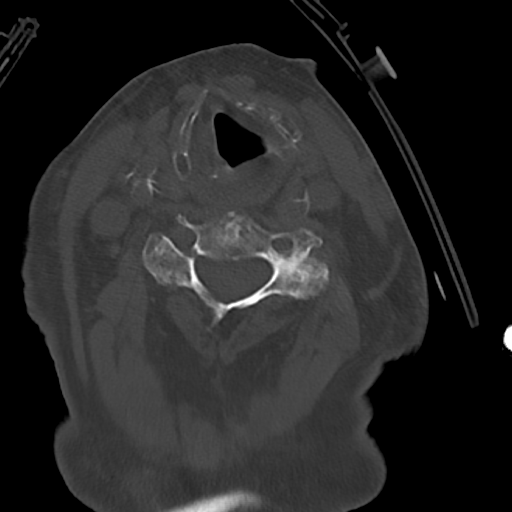
[im 82/109  bone]
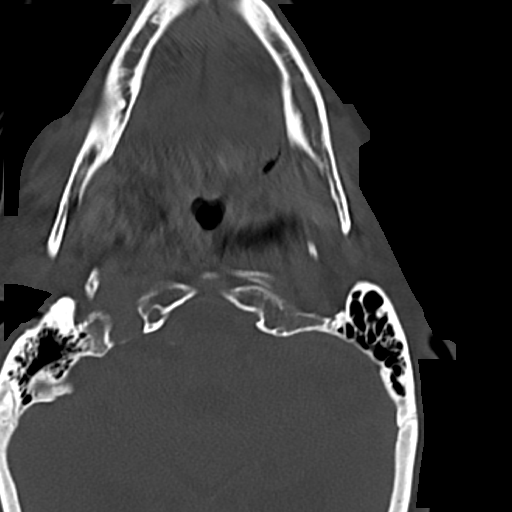

[16 of 35 positions shown; findings below may reference images not displayed]

FINDINGS: Alignment: Mild anterolisthesis of C6 on C7 and C7 on T1, likely
degenerative.

Skull base and vertebrae: No acute fracture. No primary bone lesion
or focal pathologic process.

Soft tissues and spinal canal: No prevertebral fluid or swelling. No
visible canal hematoma.

Disc levels:  Multilevel osteoarthritic changes, moderate.

Upper chest: Negative.

Other: Motion degraded exam.
IMPRESSION: 1. No evidence of acute traumatic injury to the cervical spine.
2. Multilevel osteoarthritic changes of the cervical spine.
3. Mild anterolisthesis of C6 on C7 and C7 on T1, likely
degenerative.

## 2020-06-17 MED ORDER — SODIUM CHLORIDE 0.9 % IV BOLUS
1000.0000 mL | Freq: Once | INTRAVENOUS | Status: AC
  Administered 2020-06-17: 1000 mL via INTRAVENOUS

## 2020-06-17 MED ORDER — SODIUM CHLORIDE 0.9 % IV SOLN: INTRAVENOUS | Status: DC

## 2020-06-17 MED ORDER — INSULIN ASPART 100 UNIT/ML ~~LOC~~ SOLN
0.0000 [IU] | Freq: Three times a day (TID) | SUBCUTANEOUS | Status: DC
  Administered 2020-06-17: 5 [IU] via SUBCUTANEOUS
  Administered 2020-06-18: 2 [IU] via SUBCUTANEOUS
  Administered 2020-06-18: 3 [IU] via SUBCUTANEOUS

## 2020-06-17 MED ORDER — INSULIN ASPART 100 UNIT/ML ~~LOC~~ SOLN
6.0000 [IU] | Freq: Once | SUBCUTANEOUS | Status: AC
  Administered 2020-06-17: 6 [IU] via INTRAVENOUS

## 2020-06-17 MED ORDER — SODIUM CHLORIDE 0.9 % IV SOLN
2.0000 g | INTRAVENOUS | Status: DC
  Administered 2020-06-17 – 2020-06-18 (×2): 2 g via INTRAVENOUS
  Filled 2020-06-17 (×2): qty 2

## 2020-06-17 MED ORDER — SODIUM BICARBONATE 8.4 % IV SOLN
INTRAVENOUS | Status: DC
  Filled 2020-06-17 (×7): qty 850

## 2020-06-17 MED ORDER — ACETAMINOPHEN 650 MG RE SUPP: 650.0000 mg | Freq: Four times a day (QID) | RECTAL | Status: DC | PRN

## 2020-06-17 MED ORDER — POLYETHYLENE GLYCOL 3350 17 G PO PACK: 17.0000 g | PACK | Freq: Every day | ORAL | Status: DC | PRN

## 2020-06-17 MED ORDER — VANCOMYCIN HCL 750 MG/150ML IV SOLN: 750.0000 mg | INTRAVENOUS | Status: DC

## 2020-06-17 MED ORDER — ACETAMINOPHEN 325 MG PO TABS: 650.0000 mg | ORAL_TABLET | Freq: Four times a day (QID) | ORAL | Status: DC | PRN

## 2020-06-17 MED ORDER — VANCOMYCIN HCL IN DEXTROSE 1-5 GM/200ML-% IV SOLN
1000.0000 mg | Freq: Once | INTRAVENOUS | Status: AC
  Administered 2020-06-17: 1000 mg via INTRAVENOUS
  Filled 2020-06-17: qty 200

## 2020-06-17 MED ORDER — PANTOPRAZOLE SODIUM 40 MG PO TBEC
40.0000 mg | DELAYED_RELEASE_TABLET | Freq: Every day | ORAL | Status: DC
  Administered 2020-06-18 – 2020-06-21 (×4): 40 mg via ORAL
  Filled 2020-06-17 (×4): qty 1

## 2020-06-17 NOTE — ED Provider Notes (Signed)
[facility] EMERGENCY DEPARTMENT Provider Note   CSN: [id number] Arrival date & time: 06/17/20  1042     History No chief complaint on file.   Alexa Mcdonald is a 86 y.o. female.  Pt presents to the ED today with AMS.  Pt lives in independent living apartments at [facility].  She was last seen yesterday morning at 0930.  The staff went to her apartment this morning to bring her breakfast and she was on the ground in the kitchen.  She does not know how long she was on the ground.  Pt denies any pain.  She is on Eliquis for afib.  CHA2DS2/VAS Stroke Risk Points  Current as of 6 minutes ago     9 >= 2 Points: High Risk  1 - 1.99 Points: Medium Risk  0 Points: Low Risk    Last Change: N/A      Details    This score determines the patient's risk of having a stroke if the  patient has atrial fibrillation.       Points Metrics  1 Has Congestive Heart Failure:  Yes    Current as of 6 minutes ago  1 Has Vascular Disease:  Yes    Current as of 6 minutes ago  1 Has Hypertension:  Yes    Current as of 6 minutes ago  2 Age:  49    Current as of 6 minutes ago  1 Has Diabetes:  Yes     Current as of 6 minutes ago  2 Had Stroke:  Yes  Had TIA:  No  Had thromboembolism:  No    Current as of 6 minutes ago  1 Female:  Yes    Current as of 6 minutes ago                 Past Medical History:  Diagnosis Date  . 1st degree AV block   . Bradycardia   . Chronic diastolic heart failure ([place]) 02/07/2016  . CKD (chronic kidney disease), stage III ([place])   . Colon, diverticulosis   . Diabetes mellitus    diet controlled  . Essential hypertension   . GERD (gastroesophageal reflux disease)   . Hematochezia   . HOH (hard of hearing)    wears bilateral hearing aids  . Hx of orthostatic hypotension   . Interstitial lung disease ([place])   . LBBB (left bundle branch block)   . Long term current use of anticoagulant therapy   . Mediastinal goiter     recurrent s/p  resection 1988  . Mild CAD   . Mitral regurgitation   . Osteopenia   . Osteoporosis   . Paroxysmal atrial fibrillation ([facility])    a. CHA2DS2VASc = 5-->coumadin;  b. previously on amio->d/c'd 11/2014 secondary to concern for possible amio lung.  . Pneumonia   . 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 Severe aortic stenosis    s/p TAVR with stable prosthesis by echo 08/2019 - mean AVG 54mmHg  . Stroke ([place])   . Tachycardia-bradycardia ([place])   . Vitamin D deficiency     Patient Active Problem List   Diagnosis Date Noted  . Iron deficiency anemia 12/05/2018  . Duodenal ulcer 12/05/2018  . Gastric erosion 12/05/2018  . History of cholecystectomy 12/05/2018  . History of ERCP 12/05/2018  . Iron deficiency anemia due to chronic blood  loss   . Erosive gastritis   . Acute lower GI bleeding 11/09/2018  . Acute blood loss anemia 11/09/2018  . Symptomatic anemia 11/09/2018  . Choledocholithiasis with obstruction 10/23/2018  . Occipital stroke ([place]) 06/11/2016  . Paroxysmal atrial fibrillation ([place]) 06/11/2016  . Homonymous hemianopsia due to old embolic stroke 17/40/8144  . Loss of peripheral visual field 03/12/2016  . Ataxia 03/12/2016  . Dizziness and giddiness 03/12/2016  . Cerebrovascular accident (CVA) due to embolism of posterior cerebral artery with infarctions of both occipital lobes ([place]) 03/12/2016  . Acute on chronic diastolic CHF (congestive heart failure) ([place]) 02/07/2016  . 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 Severe aortic stenosis   . Atrial fibrillation, persistent ([place])   . Mitral regurgitation   . Interstitial lung disease ([place]) 10/30/2014  . Hematochezia 12/13/2013  . CKD (chronic kidney disease), stage III ([place]) 12/13/2013  . Hyperthyroidism 12/13/2013  . GERD (gastroesophageal reflux disease) 12/13/2013  . Chronic  anticoagulation 12/13/2013  . Elevated brain natriuretic peptide (BNP) level 12/13/2013  . Sinus bradycardia 12/13/2013  . Pneumonia 12/13/2013  . Encounter for therapeutic drug monitoring 08/29/2013  . Essential hypertension 08/05/2013  . Edema of both ankles 08/05/2013  . Bradycardia 11/27/2011    Past Surgical History:  Procedure Laterality Date  . BILIARY DILATION  10/24/2018   Procedure: BILIARY DILATION;  Surgeon: Jackquline Denmark, MD;  Location: [facility] ENDOSCOPY;  Service: Endoscopy;;  . BIOPSY  11/11/2018   Procedure: BIOPSY;  Surgeon: Irving Copas., MD;  Location: [facility] ENDOSCOPY;  Service: Gastroenterology;;  . CARDIAC CATHETERIZATION N/A 10/31/2015   Procedure: Right/Left Heart Cath and Coronary Angiography;  Surgeon: [name], MD;  Location: [facility] CV LAB;  Service: Cardiovascular;  Laterality: N/A;  . CARPAL TUNNEL RELEASE Bilateral   . CATARACT EXTRACTION Bilateral   . CESAREAN SECTION     x3  . CHOLECYSTECTOMY N/A 10/25/2018   Procedure: LAPAROSCOPIC CHOLECYSTECTOMY WITH INTRAOPERATIVE CHOLANGIOGRAM;  Surgeon: Kieth Brightly Arta Bruce, MD;  Location: [facility] ORS;  Service: General;  Laterality: N/A;  . ERCP N/A 10/24/2018   Procedure: ENDOSCOPIC RETROGRADE CHOLANGIOPANCREATOGRAPHY (ERCP);  Surgeon: Jackquline Denmark, MD;  Location: [facility];  Service: Endoscopy;  Laterality: N/A;  . ESOPHAGOGASTRODUODENOSCOPY (EGD) WITH PROPOFOL N/A 11/11/2018   Procedure: ESOPHAGOGASTRODUODENOSCOPY (EGD) WITH PROPOFOL;  Surgeon: Rush Landmark Telford Nab., MD;  Location: [facility];  Service: Gastroenterology;  Laterality: N/A;  . mediastinal removal of a goiter    . MRI  08/18/07   head (di[facility] heart [facility])  . REMOVAL OF STONES  10/24/2018   Procedure: REMOVAL OF STONES;  Surgeon: Jackquline Denmark, MD;  Location: [facility] ENDOSCOPY;  Service: Endoscopy;;  . Joan Mayans  10/24/2018   Procedure: Joan Mayans;  Surgeon: Jackquline Denmark, MD;  Location: [facility] ENDOSCOPY;  Service: Endoscopy;;  .  TEE WITHOUT CARDIOVERSION N/A 11/27/2015   Procedure: TRANSESOPHAGEAL ECHOCARDIOGRAM (TEE);  Surgeon: [name], MD;  Location: [facility];  Service: Open Heart Surgery;  Laterality: N/A;  . TRANSCATHETER AORTIC VALVE REPLACEMENT, TRANSFEMORAL N/A 11/27/2015   Procedure: TRANSCATHETER AORTIC VALVE REPLACEMENT, TRANSFEMORAL;  Surgeon: [name], MD;  Location: [facility];  Service: Open Heart Surgery;  Laterality: N/A;     OB History   No obstetric history on file.     Family History  Problem Relation Age of Onset  . Diabetes Mother   . Cancer - Prostate Father   . Pulmonary embolism Father   . Hypertension Sister   . Diabetes Sister   . Hypertension Brother   .  Arrhythmia Brother        afib  . CAD Brother   . Pulmonary fibrosis Brother   . Colon cancer Neg Hx   . Esophageal cancer Neg Hx   . Inflammatory bowel disease Neg Hx   . Liver disease Neg Hx   . Pancreatic cancer Neg Hx   . Stomach cancer Neg Hx   . Rectal cancer Neg Hx     Social History   Tobacco Use  . Smoking status: Never Smoker  . Smokeless tobacco: Never Used  Substance Use Topics  . Alcohol use: No    Alcohol/week: 0.0 standard drinks  . Drug use: No    Home Medications Prior to Admission medications   Medication Sig Start Date End Date Taking? Authorizing Provider  acetaminophen (TYLENOL) 325 MG tablet Take 2 tablets (650 mg total) by mouth every 6 (six) hours as needed for mild pain. 10/26/18   Swayze, Ava, DO  atorvastatin (LIPITOR) 10 MG tablet Take 10 mg daily by mouth.    [provider]  calcium-vitamin D 250-100 MG-UNIT tablet Take 1 tablet by mouth daily.    [provider]  ELIQUIS 2.5 MG TABS tablet TAKE 1 TABLET(2.5 MG) BY MOUTH TWICE DAILY 01/31/20   Sueanne Margarita, MD  ferrous sulfate (FEROSUL) 325 (65 FE) MG tablet TAKE 1 TABLET(325 MG) BY MOUTH EVERY MORNING 03/07/20   Mansouraty, Telford Nab., MD  methimazole (TAPAZOLE) 5 MG tablet Take 7.5 mg by mouth daily.     [provider]  Multiple Vitamins-Minerals (OCUVITE PRESERVISION PO) Take 1 capsule by mouth daily.     [provider]  pantoprazole (PROTONIX) 40 MG tablet TAKE 1 TABLET(40 MG) BY MOUTH DAILY AT 6 AM 03/30/19   Mansouraty, Telford Nab., MD  vitamin B-12 (CYANOCOBALAMIN) 100 MCG tablet Take 100 mcg by mouth daily.    [provider]  VITAMIN D PO Take 1 tablet by mouth daily.    [provider]    Allergies    Amoxicillin and Celebrex [celecoxib]  Review of Systems   Review of Systems  All other systems reviewed and are negative.   Physical Exam Updated Vital Signs BP (!) 110/58   Pulse 76   Temp (!) 97 F (36.1 C) (Rectal)   Resp 18   SpO2 97%   Physical Exam Vitals and nursing note reviewed.  Constitutional:      Appearance: Normal appearance.  HENT:     Head: Normocephalic and atraumatic.     Right Ear: External ear normal.     Left Ear: External ear normal.     Nose: Nose normal.     Mouth/Throat:     Mouth: Mucous membranes are moist.     Pharynx: Oropharynx is clear.  Eyes:     Extraocular Movements: Extraocular movements intact.     Conjunctiva/sclera: Conjunctivae normal.     Pupils: Pupils are equal, round, and reactive to light.  Neck:     Comments: In c-collar Cardiovascular:     Rate and Rhythm: Normal rate. Rhythm irregular.     Pulses: Normal pulses.     Heart sounds: Normal heart sounds.  Pulmonary:     Effort: Pulmonary effort is normal.     Breath sounds: Normal breath sounds.  Abdominal:     General: Abdomen is flat. Bowel sounds are normal.     Palpations: Abdomen is soft.  Musculoskeletal:        General: Normal range of motion.  Skin:  General: Skin is warm.     Capillary Refill: Capillary refill takes less than 2 seconds.  Neurological:     General: No focal deficit present.     Mental Status: She is alert. She is disoriented.  Psychiatric:        Mood and Affect: Mood normal.        Behavior:  Behavior normal.        Thought Content: Thought content normal.        Judgment: Judgment normal.     ED Results / Procedures / Treatments   Labs (all labs ordered are listed, but only abnormal results are displayed) Labs Reviewed  CBC WITH DIFFERENTIAL/PLATELET - Abnormal; Notable for the following components:      Result Value   WBC 21.5 (*)    RBC 3.76 (*)    Neutro Abs 19.4 (*)    Lymphs Abs 0.5 (*)    Monocytes Absolute 1.3 (*)    Abs Immature Granulocytes 0.27 (*)    All other components within normal limits  COMPREHENSIVE METABOLIC PANEL - Abnormal; Notable for the following components:   CO2 19 (*)    Glucose, Bld 449 (*)    BUN 33 (*)    Creatinine, Ser 2.17 (*)    Calcium 8.7 (*)    Total Protein 5.8 (*)    Albumin 3.2 (*)    AST 51 (*)    Total Bilirubin 1.5 (*)    GFR, Estimated 20 (*)    Anion gap 18 (*)    All other components within normal limits  URINALYSIS, ROUTINE W REFLEX MICROSCOPIC - Abnormal; Notable for the following components:   APPearance HAZY (*)    Glucose, UA >=500 (*)    Ketones, ur 5 (*)    Protein, ur 30 (*)    Leukocytes,Ua MODERATE (*)    Bacteria, UA FEW (*)    All other components within normal limits  LACTIC ACID, PLASMA - Abnormal; Notable for the following components:   Lactic Acid, Venous 6.9 (*)    All other components within normal limits  CK - Abnormal; Notable for the following components:   Total CK 1,219 (*)    All other components within normal limits  CBG MONITORING, ED - Abnormal; Notable for the following components:   Glucose-Capillary 365 (*)    All other components within normal limits  I-STAT VENOUS BLOOD GAS, ED - Abnormal; Notable for the following components:   pCO2, Ven 42.5 (*)    pO2, Ven 52.0 (*)    Bicarbonate 18.9 (*)    TCO2 20 (*)    Acid-base deficit 8.0 (*)    Calcium, Ion 1.07 (*)    All other components within normal limits  CBG MONITORING, ED - Abnormal; Notable for the following  components:   Glucose-Capillary 392 (*)    All other components within normal limits  RESPIRATORY PANEL BY RT PCR (FLU A&B, COVID)  URINE CULTURE  CULTURE, BLOOD (ROUTINE X 2)  CULTURE, BLOOD (ROUTINE X 2)  TSH  BETA-HYDROXYBUTYRIC ACID  LACTIC ACID, PLASMA  LACTIC ACID, PLASMA    EKG EKG Interpretation  Date/Time:  Sunday June 17 2020 10:44:05 EST Ventricular Rate:  60 PR Interval:    QRS Duration: 159 QT Interval:  528 QTC Calculation: 528 R Axis:   -46 Text Interpretation: Atrial fibrillation Left bundle branch block No significant change since last tracing Confirmed by Isla Pence [phone]) on 06/17/2020 10:46:38 AM   Radiology DG Chest 2  View  Result Date: 06/17/2020 CLINICAL DATA:  Status post fall. EXAM: CHEST - 2 VIEW COMPARISON:  November 09, 2018 FINDINGS: Enlarged cardiac silhouette. Calcific atherosclerotic disease and tortuosity of the aorta. Stable postsurgical changes at the thoracic inlet and aortic valve prosthesis noted. There is no evidence of focal airspace consolidation, pleural effusion or pneumothorax. Mild interstitial scarring as before. Osseous structures are without acute abnormality. Soft tissues are grossly normal. IMPRESSION: 1. No active cardiopulmonary disease. 2. Enlarged cardiac silhouette. 3. Calcific atherosclerotic disease and tortuosity of the aorta. Electronically Signed   By: Fidela Salisbury M.D.   On: 06/17/2020 11:59   DG Pelvis 1-2 Views  Result Date: 06/17/2020 CLINICAL DATA:  Fall EXAM: PELVIS - 1-2 VIEW COMPARISON:  October 22, 2018 FINDINGS: Osteopenia. No definitive fracture on this single radiograph. There is rotation of the RIGHT hip. Evaluation of the pubic ramus is limited secondary to large rectal stool ball. Assimilation joint of LEFT L5-S1. Degenerative changes of the lower lumbar spine. Vascular calcifications. IMPRESSION: 1. No definitive fracture on this single radiograph. Recommend additional dedicated radiographs of  the areas of clinical concern for improved evaluation. Electronically Signed   By: Valentino Saxon MD   On: 06/17/2020 12:01   CT Head Wo Contrast  Result Date: 06/17/2020 CLINICAL DATA:  Post head trauma. EXAM: CT HEAD WITHOUT CONTRAST TECHNIQUE: Contiguous axial images were obtained from the base of the skull through the vertex without intravenous contrast. COMPARISON:  CT of the head February 07, 2016 FINDINGS: Brain: No evidence of acute infarction, hemorrhage, hydrocephalus, extra-axial collection or mass lesion/mass effect. Insert full malacia from chronic left occipital infarct. Advanced brain parenchymal volume loss and moderate deep white matter microangiopathy. Vascular: Calcific atherosclerotic disease. Skull: Normal. Negative for fracture or focal lesion. Sinuses/Orbits: No acute finding. Other: None. IMPRESSION: 1. No acute intracranial abnormality. 2. Advanced brain parenchymal atrophy and chronic microvascular disease. 3. Chronic left occipital infarct. Electronically Signed   By: Fidela Salisbury M.D.   On: 06/17/2020 13:22   CT Cervical Spine Wo Contrast  Result Date: 06/17/2020 CLINICAL DATA:  Status post fall with neck trauma. EXAM: CT CERVICAL SPINE WITHOUT CONTRAST TECHNIQUE: Multidetector CT imaging of the cervical spine was performed without intravenous contrast. Multiplanar CT image reconstructions were also generated. COMPARISON:  None. FINDINGS: Alignment: Mild anterolisthesis of C6 on C7 and C7 on T1, likely degenerative. Skull base and vertebrae: No acute fracture. No primary bone lesion or focal pathologic process. Soft tissues and spinal canal: No prevertebral fluid or swelling. No visible canal hematoma. Disc levels:  Multilevel osteoarthritic changes, moderate. Upper chest: Negative. Other: Motion degraded exam. IMPRESSION: 1. No evidence of acute traumatic injury to the cervical spine. 2. Multilevel osteoarthritic changes of the cervical spine. 3. Mild anterolisthesis of  C6 on C7 and C7 on T1, likely degenerative. Electronically Signed   By: Fidela Salisbury M.D.   On: 06/17/2020 13:27    Procedures Procedures (including critical care time)  Medications Ordered in ED Medications  sodium bicarbonate 150 mEq in dextrose 5 % 1,000 mL infusion ( Intravenous New Bag/Given 06/17/20 1304)  sodium chloride 0.9 % bolus 1,000 mL (0 mLs Intravenous Stopped 06/17/20 1230)  insulin aspart (novoLOG) injection 6 Units (6 Units Intravenous Given 06/17/20 1301)    ED Course  I have reviewed the triage vital signs and the nursing notes.  Pertinent labs & imaging results that were available during my care of the patient were reviewed by me and considered in my medical decision  making (see chart for details).    MDM Rules/Calculators/A&P                          Pt does have evidence of rhabdo on labs.  She also has some AKI.  She was given IVFs and was started on a bicarb drip.  Pt has lactic acidosis.  Cultures sent, but pt does not appear to be septic.   WBC is elevated, but no fever or source of infection. Pt given IVFs.  Lactic acid will be repeated.  Blood sugar is elevated.  PT has a hx of DM, but she is not on any medications for it.  Pt given 6 units IV insulin and vbg/BHB ordered.  Pt is on Eliquis for afib, but no evidence of head injury.  Covid is negative.  Son updated by phone.  He was unaware pt was brought in by EMS.  Pt d/w Dr. Jamse Arn ([facility]) for admission.  CRITICAL CARE Performed by: Isla Pence   Total critical care time: 30 minutes  Critical care time was exclusive of separately billable procedures and treating other patients.  Critical care was necessary to treat or prevent imminent or life-threatening deterioration.  Critical care was time spent personally by me on the following activities: development of treatment plan with patient and/or surrogate as well as nursing, discussions with consultants, evaluation of patient's  response to treatment, examination of patient, obtaining history from patient or surrogate, ordering and performing treatments and interventions, ordering and review of laboratory studies, ordering and review of radiographic studies, pulse oximetry and re-evaluation of patient's condition.  SHEYANN SULTON was evaluated in Emergency Department on 06/17/2020 for the symptoms described in the history of present illness. She was evaluated in the context of the global COVID-19 pandemic, which necessitated consideration that the patient might be at risk for infection with the SARS-CoV-2 virus that causes COVID-19. Institutional protocols and algorithms that pertain to the evaluation of patients at risk for COVID-19 are in a state of rapid change based on information released by regulatory bodies including the CDC and federal and state organizations. These policies and algorithms were followed during the patient's care in the ED.  Final Clinical Impression(s) / ED Diagnoses Final diagnoses:  Fall, initial encounter  Traumatic rhabdomyolysis, initial encounter ([place])  AKI (acute kidney injury) ([place])  Lactic acidosis  Hyperglycemia    Rx / DC Orders ED Discharge Orders    None       Isla Pence, MD 06/17/20 1355

## 2020-06-17 NOTE — ED Notes (Signed)
Attempted report, charge RN of floor calling [facility] due to pt not being appropriate for floor.

## 2020-06-17 NOTE — ED Notes (Signed)
Patient transported to X-ray 

## 2020-06-17 NOTE — H&P (Addendum)
History and Physical:    Alexa Mcdonald   GXQ:[id number] DOB: 12-May-1921 DOA: 06/17/2020  Referring MD/provider:  PCP: Jonathon Jordan, MD   Patient coming from: Home  Chief Complaint: Found on the ground by staff at [facility] this morning   History of Present Illness:   Alexa Mcdonald is an 86 y.o. female with PMH significant for HTN, atrial fibrillation, LBBB, CKD 3, HFpEF, AS s/p TAVR, interstitial lung disease, erosive gastritis, diverticulosis and dementia was last seen normal yesterday and was found down on the floor this morning at breakfast by staff at her assisted living facility at [facility]. Patient is unable to provide a history because she says she does not remember what happened.  History is per ED staff who got history from EMS.  Patient was apparently last seen normal yesterday morning when they delivered her breakfast.  They found her down on the ground this morning in her kitchen when they went to deliver her breakfast again.  No one knows when she fell or how long she was on the ground.  Patient herself believes that she is in her own apartment.  She believes the nurse who is placing the IV is the person who gives her her medications usually.  She denies any pain.  She specifically denies chest pain, abdominal pain, hip pain, neck pain or headache.  Patient discussed with her son Mr. Alexa Mcdonald who states that patient does have baseline dementia, does not really know dates or times but is able to live in assisted living on her own.  They usually bring her breakfast and give her her medications.  The rest of the time she apparently manages okay and her apartment.  Patient son who is her POA affirms that patient is in fact DNR.  Notes "what are we going to bring her back from, she is 89".  Most recent echocardiogram was January 2021 showed an atrial fibrillation, LV with EF of 55 to 60% with moderately increased LVH, moderately dilated left atrium.  ED  Course:  The patient was noted to be normotensive and afebrile.  She was awake and alert however clearly confused.  She was moving all of her extremities without difficulty.  Patient underwent laboratory and radiologic work-up which were notable for chest x-ray, hip and pelvic films, C-spine and head CT all without any acute abnormality. Laboratory data were notable for leukocytosis at 21, elevated CPK at 1219, markedly high glucose at 449, lactate of 7 and modest increase in BUN and creatinine.  ROS:   ROS   Review of Systems: Unable to provide due to confusion.  Past Medical History:   Past Medical History:  Diagnosis Date  . 1st degree AV block   . Bradycardia   . Chronic diastolic heart failure ([place]) 02/07/2016  . CKD (chronic kidney disease), stage III ([place])   . Colon, diverticulosis   . Diabetes mellitus    diet controlled  . Essential hypertension   . GERD (gastroesophageal reflux disease)   . Hematochezia   . HOH (hard of hearing)    wears bilateral hearing aids  . Hx of orthostatic hypotension   . Interstitial lung disease ([place])   . LBBB (left bundle branch block)   . Long term current use of anticoagulant therapy   . Mediastinal goiter     recurrent s/p resection 1988  . Mild CAD   . Mitral regurgitation   . Osteopenia   . Osteoporosis   . Paroxysmal  atrial fibrillation ([place])    a. CHA2DS2VASc = 5-->coumadin;  b. previously on amio->d/c'd 11/2014 secondary to concern for possible amio lung.  . Pneumonia   . 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 Severe aortic stenosis    s/p TAVR with stable prosthesis by echo 08/2019 - mean AVG 48mmHg  . Stroke ([place])   . Tachycardia-bradycardia ([place])   . Vitamin D deficiency     Past Surgical History:   Past Surgical History:  Procedure Laterality Date  . BILIARY DILATION  10/24/2018   Procedure:  BILIARY DILATION;  Surgeon: Jackquline Denmark, MD;  Location: [facility] ENDOSCOPY;  Service: Endoscopy;;  . BIOPSY  11/11/2018   Procedure: BIOPSY;  Surgeon: Irving Copas., MD;  Location: [facility] ENDOSCOPY;  Service: Gastroenterology;;  . CARDIAC CATHETERIZATION N/A 10/31/2015   Procedure: Right/Left Heart Cath and Coronary Angiography;  Surgeon: [name], MD;  Location: [facility] CV LAB;  Service: Cardiovascular;  Laterality: N/A;  . CARPAL TUNNEL RELEASE Bilateral   . CATARACT EXTRACTION Bilateral   . CESAREAN SECTION     x3  . CHOLECYSTECTOMY N/A 10/25/2018   Procedure: LAPAROSCOPIC CHOLECYSTECTOMY WITH INTRAOPERATIVE CHOLANGIOGRAM;  Surgeon: Kieth Brightly Arta Bruce, MD;  Location: [facility] ORS;  Service: General;  Laterality: N/A;  . ERCP N/A 10/24/2018   Procedure: ENDOSCOPIC RETROGRADE CHOLANGIOPANCREATOGRAPHY (ERCP);  Surgeon: Jackquline Denmark, MD;  Location: [facility];  Service: Endoscopy;  Laterality: N/A;  . ESOPHAGOGASTRODUODENOSCOPY (EGD) WITH PROPOFOL N/A 11/11/2018   Procedure: ESOPHAGOGASTRODUODENOSCOPY (EGD) WITH PROPOFOL;  Surgeon: Rush Landmark Telford Nab., MD;  Location: [place];  Service: Gastroenterology;  Laterality: N/A;  . mediastinal removal of a goiter    . MRI  08/18/07   head (diabetes heart study at [facility])  . REMOVAL OF STONES  10/24/2018   Procedure: REMOVAL OF STONES;  Surgeon: Jackquline Denmark, MD;  Location: [facility] ENDOSCOPY;  Service: Endoscopy;;  . Alexa Mcdonald  10/24/2018   Procedure: Alexa Mcdonald;  Surgeon: Jackquline Denmark, MD;  Location: [facility] ENDOSCOPY;  Service: Endoscopy;;  . TEE WITHOUT CARDIOVERSION N/A 11/27/2015   Procedure: TRANSESOPHAGEAL ECHOCARDIOGRAM (TEE);  Surgeon: [name], MD;  Location: [place];  Service: Open Heart Surgery;  Laterality: N/A;  . TRANSCATHETER AORTIC VALVE REPLACEMENT, TRANSFEMORAL N/A 11/27/2015   Procedure: TRANSCATHETER AORTIC VALVE REPLACEMENT, TRANSFEMORAL;  Surgeon: [name], MD;  Location: [place];  Service: Open Heart Surgery;   Laterality: N/A;    Social History:   Social History   Socioeconomic History  . Marital status: Widowed    Spouse name: Not on file  . Number of children: 3  . Years of education: Not on file  . Highest education level: Not on file  Occupational History  . Occupation: retire  Tobacco Use  . Smoking status: Never Smoker  . Smokeless tobacco: Never Used  Substance and Sexual Activity  . Alcohol use: No    Alcohol/week: 0.0 standard drinks  . Drug use: No  . Sexual activity: Not on file  Other Topics Concern  . Not on file  Social History Narrative   Lives in [place].  Widowed.   Social Determinants of Health   Financial Resource Strain:   . Difficulty of Paying Living Expenses: Not on file  Food Insecurity:   . Worried About Charity fundraiser in the Last Year: Not on file  . Ran Out of Food in the Last Year: Not on file  Transportation Needs:   .  Lack of Transportation (Medical): Not on file  . Lack of Transportation (Non-Medical): Not on file  Physical Activity:   . Mcdonald of Exercise per Week: Not on file  . Minutes of Exercise per Session: Not on file  Stress:   . Feeling of Stress : Not on file  Social Connections:   . Frequency of Communication with Friends and Family: Not on file  . Frequency of Social Gatherings with Friends and Family: Not on file  . Attends Religious Services: Not on file  . Active Member of Clubs or Organizations: Not on file  . Attends Archivist Meetings: Not on file  . Marital Status: Not on file  Intimate Partner Violence:   . Fear of Current or Ex-Partner: Not on file  . Emotionally Abused: Not on file  . Physically Abused: Not on file  . Sexually Abused: Not on file    Allergies   Amoxicillin and Celebrex [celecoxib]  Family history:   Family History  Problem Relation Age of Onset  . Diabetes Mother   . Cancer - Prostate Father   . Pulmonary embolism Father   . Hypertension Sister   . Diabetes Sister   .  Hypertension Brother   . Arrhythmia Brother        afib  . CAD Brother   . Pulmonary fibrosis Brother   . Colon cancer Neg Hx   . Esophageal cancer Neg Hx   . Inflammatory bowel disease Neg Hx   . Liver disease Neg Hx   . Pancreatic cancer Neg Hx   . Stomach cancer Neg Hx   . Rectal cancer Neg Hx     Current Medications:   Prior to Admission medications   Medication Sig Start Date End Date Taking? Authorizing Provider  acetaminophen (TYLENOL) 325 MG tablet Take 2 tablets (650 mg total) by mouth every 6 (six) hours as needed for mild pain. 10/26/18   Swayze, Ava, DO  atorvastatin (LIPITOR) 10 MG tablet Take 10 mg daily by mouth.    [provider]  calcium-vitamin D 250-100 MG-UNIT tablet Take 1 tablet by mouth daily.    [provider]  ELIQUIS 2.5 MG TABS tablet TAKE 1 TABLET(2.5 MG) BY MOUTH TWICE DAILY 01/31/20   Sueanne Margarita, MD  ferrous sulfate (FEROSUL) 325 (65 FE) MG tablet TAKE 1 TABLET(325 MG) BY MOUTH EVERY MORNING 03/07/20   Mansouraty, Telford Nab., MD  methimazole (TAPAZOLE) 5 MG tablet Take 7.5 mg by mouth daily.    [provider]  Multiple Vitamins-Minerals (OCUVITE PRESERVISION PO) Take 1 capsule by mouth daily.     [provider]  pantoprazole (PROTONIX) 40 MG tablet TAKE 1 TABLET(40 MG) BY MOUTH DAILY AT 6 AM 03/30/19   Mansouraty, Telford Nab., MD  vitamin B-12 (CYANOCOBALAMIN) 100 MCG tablet Take 100 mcg by mouth daily.    [provider]  VITAMIN D PO Take 1 tablet by mouth daily.    [provider]    Physical Exam:   Vitals:   06/17/20 1130 06/17/20 1145 06/17/20 1230 06/17/20 1333  BP: 124/69 (!) 116/56 (!) 116/57 (!) 110/58  Pulse: 66 62 78 76  Resp: 19 (!) 24 (!) 22 18  Temp:      TempSrc:      SpO2: 97% 99% 92% 97%     Physical Exam: Blood pressure (!) 110/58, pulse 76, temperature (!) 97 F (36.1 C), temperature source Rectal, resp. rate 18, SpO2 97 %. Gen: Awake, pleasantly confused  female  lying flat in bed in no acute distress. Eyes: sclera anicteric, conjuctiva mildly injected bilaterally CVS: S1-S2, regulary, no gallops Respiratory:  decreased air entry likely secondary to decreased inspiratory effort GI: NABS, soft, NT, no guarding, no rebound tenderness. LE: No edema. No cyanosis Neuro: Alert, normal speech, moving all extremities equally with normal strength, following commands. Psych: Patient is acutely confused    Data Review:    Labs: Basic Metabolic Panel: Recent Labs  Lab 06/17/20 1054 06/17/20 1312  NA 137 139  K 4.4 3.9  CL 100  --   CO2 19*  --   GLUCOSE 449*  --   BUN 33*  --   CREATININE 2.17*  --   CALCIUM 8.7*  --    Liver Function Tests: Recent Labs  Lab 06/17/20 1054  AST 51*  ALT 20  ALKPHOS 94  BILITOT 1.5*  PROT 5.8*  ALBUMIN 3.2*   No results for input(s): LIPASE, AMYLASE in the last 168 hours. No results for input(s): AMMONIA in the last 168 hours. CBC: Recent Labs  Lab 06/17/20 1054 06/17/20 1312  WBC 21.5*  --   NEUTROABS 19.4*  --   HGB 12.0 12.2  HCT 37.4 36.0  MCV 99.5  --   PLT 235  --    Cardiac Enzymes: Recent Labs  Lab 06/17/20 1054  CKTOTAL 1,219*    BNP (last 3 results) No results for input(s): PROBNP in the last 8760 hours. CBG: Recent Labs  Lab 06/17/20 1132 06/17/20 1259  GLUCAP 365* 392*    Urinalysis    Component Value Date/Time   COLORURINE YELLOW 06/17/2020 1146   APPEARANCEUR HAZY (A) 06/17/2020 1146   LABSPEC 1.015 06/17/2020 1146   PHURINE 5.0 06/17/2020 1146   GLUCOSEU >=500 (A) 06/17/2020 1146   HGBUR NEGATIVE 06/17/2020 1146   BILIRUBINUR NEGATIVE 06/17/2020 1146   KETONESUR 5 (A) 06/17/2020 1146   PROTEINUR 30 (A) 06/17/2020 1146   NITRITE NEGATIVE 06/17/2020 1146   LEUKOCYTESUR MODERATE (A) 06/17/2020 1146      Radiographic Studies: DG Chest 2 View  Result Date: 06/17/2020 CLINICAL DATA:  Status post fall. EXAM: CHEST - 2 VIEW COMPARISON:  November 09, 2018  FINDINGS: Enlarged cardiac silhouette. Calcific atherosclerotic disease and tortuosity of the aorta. Stable postsurgical changes at the thoracic inlet and aortic valve prosthesis noted. There is no evidence of focal airspace consolidation, pleural effusion or pneumothorax. Mild interstitial scarring as before. Osseous structures are without acute abnormality. Soft tissues are grossly normal. IMPRESSION: 1. No active cardiopulmonary disease. 2. Enlarged cardiac silhouette. 3. Calcific atherosclerotic disease and tortuosity of the aorta. Electronically Signed   By: Fidela Salisbury M.D.   On: 06/17/2020 11:59   DG Pelvis 1-2 Views  Result Date: 06/17/2020 CLINICAL DATA:  Fall EXAM: PELVIS - 1-2 VIEW COMPARISON:  October 22, 2018 FINDINGS: Osteopenia. No definitive fracture on this single radiograph. There is rotation of the RIGHT hip. Evaluation of the pubic ramus is limited secondary to large rectal stool ball. Assimilation joint of LEFT L5-S1. Degenerative changes of the lower lumbar spine. Vascular calcifications. IMPRESSION: 1. No definitive fracture on this single radiograph. Recommend additional dedicated radiographs of the areas of clinical concern for improved evaluation. Electronically Signed   By: Valentino Saxon MD   On: 06/17/2020 12:01   CT Head Wo Contrast  Result Date: 06/17/2020 CLINICAL DATA:  Post head trauma. EXAM: CT HEAD WITHOUT CONTRAST TECHNIQUE: Contiguous axial images were obtained from the base of the skull through  the vertex without intravenous contrast. COMPARISON:  CT of the head February 07, 2016 FINDINGS: Brain: No evidence of acute infarction, hemorrhage, hydrocephalus, extra-axial collection or mass lesion/mass effect. Insert full malacia from chronic left occipital infarct. Advanced brain parenchymal volume loss and moderate deep white matter microangiopathy. Vascular: Calcific atherosclerotic disease. Skull: Normal. Negative for fracture or focal lesion. Sinuses/Orbits: No  acute finding. Other: None. IMPRESSION: 1. No acute intracranial abnormality. 2. Advanced brain parenchymal atrophy and chronic microvascular disease. 3. Chronic left occipital infarct. Electronically Signed   By: Fidela Salisbury M.D.   On: 06/17/2020 13:22   CT Cervical Spine Wo Contrast  Result Date: 06/17/2020 CLINICAL DATA:  Status post fall with neck trauma. EXAM: CT CERVICAL SPINE WITHOUT CONTRAST TECHNIQUE: Multidetector CT imaging of the cervical spine was performed without intravenous contrast. Multiplanar CT image reconstructions were also generated. COMPARISON:  None. FINDINGS: Alignment: Mild anterolisthesis of C6 on C7 and C7 on T1, likely degenerative. Skull base and vertebrae: No acute fracture. No primary bone lesion or focal pathologic process. Soft tissues and spinal canal: No prevertebral fluid or swelling. No visible canal hematoma. Disc levels:  Multilevel osteoarthritic changes, moderate. Upper chest: Negative. Other: Motion degraded exam. IMPRESSION: 1. No evidence of acute traumatic injury to the cervical spine. 2. Multilevel osteoarthritic changes of the cervical spine. 3. Mild anterolisthesis of C6 on C7 and C7 on T1, likely degenerative. Electronically Signed   By: Fidela Salisbury M.D.   On: 06/17/2020 13:27    EKG: Independently reviewed.  Atrial fibrillation at 62.  LBBB.   Assessment/Plan:   Principal Problem:   Rhabdomyolysis Active Problems:   Bradycardia   Essential hypertension   CKD (chronic kidney disease), stage III (HCC)   Chronic anticoagulation   Interstitial lung disease (H[facility])   Atrial fibrillation, persistent (H[facility])   S/P TAVR (transcatheter aortic valve replacement)   Cerebrovascular accident (CVA) due to embolism of posterior cerebral artery with infarctions of both occipital lobes ([place])   Diabetes mellitus ([place])  86 year old female with moderate dementia was found down in her kitchen by assisted living staff.  Is unclear how long she has  been down.  She does have mild rhabdomyolysis and mild worsening of her chronic kidney disease.  She does have ongoing which is may not be too different from her baseline.  Rhabdomyolysis secondary to fall and inability to get up Will treat with modest fluid resuscitation and follow  Rising lactate Initial lactate 6.7, elevated to 8 after fluid resuscitation Unclear source of rising lactate Will start empiric antibiotics with ceftriaxone and vancomycin pending blood cultures and UA We'll also repeat CBC and UA for ketones, to rule out DKA which I doubt  Acute on chronic kidney disease Likely secondary to elevated CPK, will treat with modest fluid resuscitation and follow  Hyperglycemia/DM2 Because of significant hyperglycemia is not clear as patient is apparently normally diet controlled Patient may or may not have gotten glucose either at [employer] or on route to ED She was treated with 6 units of insulin and fluids Will follow sugars closely AC with very low dose SSI, carb controlled diet No clear evidence of why her sugar may be elevated at present, no evidence of acute infection at present.  Erosive gastritis Some decrease in H&H, hemoglobin was 14 in January 2021 and it is 12 today. Patient is hemodynamically stable, will send stool for Hemoccult Repeat H&H in the morning, after hydration Will restart PPI, Protonix 40 mg daily for now Can uptitrate  dose as warranted once medicines have been reconciled  Leukocytosis Possibly stress margination No evidence of infection with negative chest x-ray UA does need to be repeated given multiple squamous epithelials  UA Not a clean-catch, will repeat  Dementia Patient with moderate dementia per history provided by son Will need to watch for sundowning, avoid benzodiazepines Patient is not on any antidementia medications  Atrial fibrillation/tachybradycardia syndrome/known bradycardia Will hold Eliquis given recent fall Would  recommend reassessing risks and benefits of Eliquis and this 86 year old with history of falls Patient does not appear to be on any rate control agents given known history of bradycardia however await medicine reconciliation.  Hyperthyroidism Will restart methimazole once patient's medication dose has been confirmed    Other information:   DVT prophylaxis: SCD ordered. Code Status: DNR, confirmed with patient's POA Mr. Alexa Mcdonald Family Communication: Spoke with patient's son Hewitt Blade Disposition Plan: TBD, possibly [facility] Consults called: None Admission status: Inpatient  Walsie Smeltz Tublu Khalik Pewitt [facility]  If 7PM-7AM, please contact night-coverage [email or web address] Password [email or web address] 06/17/2020, 2:05 PM

## 2020-06-17 NOTE — ED Notes (Signed)
Assumed care on patient , patient sleeping with no distress, respirations unlabored , IV sites intact , waiting for inpatient bed assignment .

## 2020-06-17 NOTE — ED Notes (Signed)
Pt removed c-collar. Pt not allowing staff to put it back on. Pt attempting to remove monitors.

## 2020-06-17 NOTE — Progress Notes (Signed)
Pharmacy Antibiotic Note  Alexa Mcdonald is a 86 y.o. female admitted on 06/17/2020. Pharmacy has been consulted for cefepime and vancomycin dosing. Pt is afebrile but WBC is significantly elevated. Lactic acid is increasing. To start antibiotics x 72 hours.   Plan: Cefepime 2gm IV Q24H x 72 hours Vancomycin 1gm IV then 750mg  IV Q48H x 72 hours F/u renal fxn, C&S, clinical status and trough at SS  Height: 5' (152.4 cm) Weight: 59.9 kg (132 lb) IBW/kg (Calculated) : 45.5  Temp (24hrs), Avg:97.5 F (36.4 C), Min:97 F (36.1 C), Max:97.9 F (36.6 C)  Recent Labs  Lab 06/17/20 1054 06/17/20 1419  WBC 21.5*  --   CREATININE 2.17*  --   LATICACIDVEN 6.9* 8.3*    Estimated Creatinine Clearance: 12 mL/min (A) (by C-G formula based on SCr of 2.17 mg/dL (H)).    Allergies  Allergen Reactions  . Amoxicillin Other (See Comments)    tongue felt bruised Did it involve swelling of the face/tongue/throat, SOB, or low BP? No Did it involve sudden or severe rash/hives, skin peeling, or any reaction on the inside of your mouth or nose? No Did you need to seek medical attention at a hospital or doctor's office? No When did it last happen?childhood If all above answers are "NO", may proceed with cephalosporin use.    . Celebrex [Celecoxib] Other (See Comments)    Mouth sores    Antimicrobials this admission: Vanc 11/14>> Cefepime 11/14>>  Dose adjustments this admission: N/A  Microbiology results: Pending  Thank you for allowing pharmacy to be a part of this patient's care.  Eliot Bencivenga, Rande Lawman 06/17/2020 5:00 PM

## 2020-06-17 NOTE — ED Triage Notes (Signed)
Per [facility]: Pt is coming from [facility]. Pt was last seem on 11-13 at 0930. Found this morning on the floor of the kitchen. Pt complaining of pain to right elbow, right hip, and right leg pain. Pt may have slight shortening of the right leg. Pedal pulses are present. Pt arrives with C-collar in place and an 18 g IV in L AC. Pt given about 650 ml of saline with EMS. Pt has hx of dementia and has been confused with EMS but staff at facility Pt in a-fib with EMS.

## 2020-06-17 NOTE — ED Notes (Signed)
Pt taken to radiology

## 2020-06-17 NOTE — ED Notes (Addendum)
Per Dr. Gilford Raid C-spine is cleared.  Put on bedpan.

## 2020-06-17 NOTE — Progress Notes (Signed)
Pt arrived to 4E-06. CHG bath completed and new gown applied. Tele applied and CCMD notified. VS stable. HR 71. A-fib on the monitor. Pt alert and oriented to self but disoriented to time, place and situation. Will continue to monitor. Adella Hare, RN

## 2020-06-18 DIAGNOSIS — I4819 Other persistent atrial fibrillation: Secondary | ICD-10-CM | POA: Diagnosis not present

## 2020-06-18 DIAGNOSIS — N179 Acute kidney failure, unspecified: Secondary | ICD-10-CM | POA: Diagnosis not present

## 2020-06-18 DIAGNOSIS — M6282 Rhabdomyolysis: Secondary | ICD-10-CM | POA: Diagnosis not present

## 2020-06-18 DIAGNOSIS — I63439 Cerebral infarction due to embolism of unspecified posterior cerebral artery: Secondary | ICD-10-CM | POA: Diagnosis not present

## 2020-06-18 LAB — BASIC METABOLIC PANEL
Anion gap: 13 (ref 5–15)
BUN: 37 mg/dL — ABNORMAL HIGH (ref 8–23)
CO2: 24 mmol/L (ref 22–32)
Calcium: 8.1 mg/dL — ABNORMAL LOW (ref 8.9–10.3)
Chloride: 102 mmol/L (ref 98–111)
Creatinine, Ser: 2.34 mg/dL — ABNORMAL HIGH (ref 0.44–1.00)
GFR, Estimated: 18 mL/min — ABNORMAL LOW (ref >60)
Glucose, Bld: 302 mg/dL — ABNORMAL HIGH (ref 70–99)
Potassium: 4.2 mmol/L (ref 3.5–5.1)
Sodium: 139 mmol/L (ref 135–145)

## 2020-06-18 LAB — GLUCOSE, CAPILLARY
Glucose-Capillary: 261 mg/dL — ABNORMAL HIGH (ref 70–99)
Glucose-Capillary: 221 mg/dL — ABNORMAL HIGH (ref 70–99)
Glucose-Capillary: 175 mg/dL — ABNORMAL HIGH (ref 70–99)
Glucose-Capillary: 129 mg/dL — ABNORMAL HIGH (ref 70–99)

## 2020-06-18 LAB — CBC
HCT: 38.5 % (ref 36.0–46.0)
Hemoglobin: 12.6 g/dL (ref 12.0–15.0)
MCH: 31.6 pg (ref 26.0–34.0)
MCHC: 32.7 g/dL (ref 30.0–36.0)
MCV: 96.5 fL (ref 80.0–100.0)
Platelets: 176 10*3/uL (ref 150–400)
RBC: 3.99 MIL/uL (ref 3.87–5.11)
RDW: 13.5 % (ref 11.5–15.5)
WBC: 19.5 10*3/uL — ABNORMAL HIGH (ref 4.0–10.5)
nRBC: 0 % (ref 0.0–0.2)

## 2020-06-18 LAB — URINE CULTURE: Culture: NO GROWTH

## 2020-06-18 LAB — LACTIC ACID, PLASMA: Lactic Acid, Venous: 4.4 mmol/L (ref 0.5–1.9)

## 2020-06-18 LAB — CK: Total CK: 1051 U/L — ABNORMAL HIGH (ref 38–234)

## 2020-06-18 MED ORDER — SENNOSIDES-DOCUSATE SODIUM 8.6-50 MG PO TABS: 2.0000 | ORAL_TABLET | Freq: Every day | ORAL | Status: DC

## 2020-06-18 MED ORDER — INSULIN GLARGINE 100 UNIT/ML ~~LOC~~ SOLN
8.0000 [IU] | Freq: Every day | SUBCUTANEOUS | Status: DC
  Administered 2020-06-18 – 2020-06-20 (×3): 8 [IU] via SUBCUTANEOUS
  Filled 2020-06-18 (×3): qty 0.08

## 2020-06-18 MED ORDER — INSULIN ASPART 100 UNIT/ML ~~LOC~~ SOLN
0.0000 [IU] | Freq: Three times a day (TID) | SUBCUTANEOUS | Status: DC
  Administered 2020-06-18: 2 [IU] via SUBCUTANEOUS
  Administered 2020-06-19 – 2020-06-21 (×4): 1 [IU] via SUBCUTANEOUS

## 2020-06-18 MED ORDER — VITAMIN B-12 1000 MCG PO TABS
1000.0000 ug | ORAL_TABLET | Freq: Every day | ORAL | Status: DC
  Administered 2020-06-19 – 2020-06-21 (×3): 1000 ug via ORAL
  Filled 2020-06-18 (×3): qty 1

## 2020-06-18 MED ORDER — POLYETHYLENE GLYCOL 3350 17 G PO PACK
17.0000 g | PACK | Freq: Four times a day (QID) | ORAL | Status: AC
  Administered 2020-06-18: 17 g via ORAL
  Filled 2020-06-18: qty 1

## 2020-06-18 MED ORDER — METHIMAZOLE 5 MG PO TABS
5.0000 mg | ORAL_TABLET | Freq: Every day | ORAL | Status: DC
  Administered 2020-06-19 – 2020-06-21 (×3): 5 mg via ORAL
  Filled 2020-06-18 (×3): qty 1

## 2020-06-18 NOTE — Progress Notes (Addendum)
PROGRESS NOTE        PATIENT DETAILS Name: Alexa Mcdonald Age: 86 y.o. Sex: female Date of Birth: Jun 06, 1921 Admit Date: 06/17/2020 Admitting Physician Vashti Hey, MD GYI:RSWNIOE, Ivin Booty, MD  Brief Narrative: Patient is a 86 y.o. female advanced dementia, HTN, atrial fibrillation on anticoagulation, CKD stage III, HFpEF, aortic stenosis s/p TAVR found at her independent living facility on the floor-found to have AKI and rhabdomyolysis.  Subsequently admitted to the hospitalist service.  See below for further details.  Significant events: 11/14>> found on the floor in the kitchen at independent living-found to have rhabdomyolysis, AKI.  Significant studies: 11/14>> CT head: No acute intracranial abnormality. 11/14>> CT C-spine: No acute traumatic injury. 11/14>> x-ray pelvis: No fracture seen. 11/14>> chest x-ray: No acute cardiopulmonary disease.  Antimicrobial therapy: Vancomycin: 11/14>> Cefepime: 11/14>>  Microbiology data: 11/14>> blood culture: No growth  Procedures : None  Consults: None  DVT Prophylaxis : SCDs Start: 06/17/20 1509  SQ heparin   Subjective: Pleasantly confused-Per nursing staff-no major issues overnight.  Lying comfortably in bed when I saw her.  Not in any distress.  Assessment/Plan: Rhabdomyolysis: Appears to be mild-secondary to being down on the floor in the kitchen for an unknown period of time.  Continue gentle hydration.  AKI on CKD stage IIIa: AKI likely hemodynamically mediated and from?  Osmotic diuresis due to hyperglycemia-supportive care-optimize glycemic regimen further-avoid nephrotoxic agents-repeat electrolytes tomorrow.  DM-2 with uncontrolled hyperglycemia (A1c 7.3 on 11/14): Does not appear to be on any oral hypoglycemic agents in the outpatient setting-start Lantus 8 units, change SSI to sensitive scale.  Follow and adjust.  Recent Labs    06/17/20 1903 06/18/20 0630  06/18/20 1111  GLUCAP 354* 261* 221*   Chronic atrial fibrillation: Rate controlled without any rate control agents-Eliquis on hold-very elderly-frail-advanced dementia-brought in for a fall-we will discuss risk/benefits with her family before reinitiating anticoagulation.  History of aortic stenosis-s/p TAVR  History of chronic diastolic heart failure: Volume status stable-hold diuretics.  History of CVA: Moving all 4 extremities-Eliquis on hold due to fall-awaiting further risk/benefits discussion with family before reinitiating anticoagulation.  History of erosive gastritis: Continue PPI  Leukocytosis: Unclear etiology-blood cultures/UA/chest x-ray negative for infection-no obvious etiology seen on exam.  Could be related to rhabdomyolysis/soft tissue compression from a fall.  Follow cultures-suspect if neg Abx could be d/c'd.  Hypothyroidism: Resume methimazole  Dementia: Pleasantly confused-maintain delirium precautions  Fall: Not sure if she syncopized or this was a mechanical fall-continue telemetry monitoring-await PT/OT eval.  Mild lactic acidosis: Unclear etiology-clinical features features not consistent with sepsis.  Has downgraded with supportive care.  Constipation/fecal impaction: Significant stool collection seen on imaging studies-Per nursing staff-had BM on 11/15-we will give MiraLAX/senna-and reassess tomorrow.  Fall/debility/deconditioning: Very elderly/frail-suspect significant amount of debility at baseline-lives at independent living-probably will require SNF on discharge.  Await rehab services/social services eval.   Diet: Diet Order            Diet Carb Modified Fluid consistency: Thin; Room service appropriate? No  Diet effective now                  Code Status:  DNR  Family Communication: Son-[name] [phone])-over the phone 11/15-very poor connection-will retry later  Disposition Plan: Status is: Inpatient  Remains inpatient appropriate  because:Inpatient level of care appropriate due to  severity of illness   Dispo: The patient is from: Home              Anticipated d/c is to: SNF              Anticipated d/c date is: 2 days              Patient currently is not medically stable to d/c.   Barriers to Discharge: Rhabdomyolysis-worsening AKi-likely will need SNF on discharge  Antimicrobial agents: Anti-infectives (From admission, onward)   Start     Dose/Rate Route Frequency Ordered Stop   06/19/20 1800  vancomycin (VANCOREADY) IVPB 750 mg/150 mL        750 mg 150 mL/hr over 60 Minutes Intravenous Every 48 hours 06/17/20 1751 06/21/20 1759   06/17/20 1800  ceFEPIme (MAXIPIME) 2 g in sodium chloride 0.9 % 100 mL IVPB        2 g 200 mL/hr over 30 Minutes Intravenous Every 24 hours 06/17/20 1700 06/20/20 1759   06/17/20 1800  vancomycin (VANCOCIN) IVPB 1000 mg/200 mL premix        1,000 mg 200 mL/hr over 60 Minutes Intravenous  Once 06/17/20 1700 06/17/20 2030       Time spent: 25- minutes-Greater than 50% of this time was spent in counseling, explanation of diagnosis, planning of further management, and coordination of care.  MEDICATIONS: Scheduled Meds: . insulin aspart  0-6 Units Subcutaneous TID WC  . pantoprazole  40 mg Oral Daily   Continuous Infusions: . sodium chloride 125 mL/hr at 06/18/20 1030  . ceFEPime (MAXIPIME) IV Stopped (06/17/20 1957)  . sodium bicarbonate (isotonic) 150 mEq in D5W 1000 mL infusion 100 mL/hr at 06/18/20 1030  . [START ON 06/19/2020] vancomycin     PRN Meds:.acetaminophen **OR** acetaminophen, polyethylene glycol   PHYSICAL EXAM: Vital signs: Vitals:   06/17/20 2324 06/18/20 0526 06/18/20 0746 06/18/20 1108  BP: (!) 96/52 (!) 101/52 (!) 108/54 (!) 104/54  Pulse: 60 (!) 57 (!) 56 (!) 56  Resp: 19 20 17 19   Temp: 98.6 F (37 C) 98.7 F (37.1 C) 97.9 F (36.6 C) (!) 97.5 F (36.4 C)  TempSrc: Oral Oral Axillary Axillary  SpO2: 98% 100% 98% 93%  Weight:      Height:        Filed Weights   06/17/20 1630 06/17/20 2054  Weight: 59.9 kg 58.1 kg   Body mass index is 25.02 kg/m.   Gen Exam: Pleasantly confused but not in any distress. HEENT:atraumatic, normocephalic Chest: B/L clear to auscultation anteriorly CVS:S1S2 regular Abdomen:soft non tender, non distended Extremities:no edema Neurology: Moves all 4 extremities.   Skin: no rash  I have personally reviewed following labs and imaging studies  LABORATORY DATA: CBC: Recent Labs  Lab 06/17/20 1054 06/17/20 1312 06/18/20 0156  WBC 21.5*  --  19.5*  NEUTROABS 19.4*  --   --   HGB 12.0 12.2 12.6  HCT 37.4 36.0 38.5  MCV 99.5  --  96.5  PLT 235  --  353    Basic Metabolic Panel: Recent Labs  Lab 06/17/20 1054 06/17/20 1312 06/17/20 1738 06/18/20 0156  NA 137 139 138 139  K 4.4 3.9 4.3 4.2  CL 100  --  103 102  CO2 19*  --  20* 24  GLUCOSE 449*  --  384* 302*  BUN 33*  --  34* 37*  CREATININE 2.17*  --  2.28* 2.34*  CALCIUM 8.7*  --  8.6* 8.1*  GFR: Estimated Creatinine Clearance: 11 mL/min (A) (by C-G formula based on SCr of 2.34 mg/dL (H)).  Liver Function Tests: Recent Labs  Lab 06/17/20 1054  AST 51*  ALT 20  ALKPHOS 94  BILITOT 1.5*  PROT 5.8*  ALBUMIN 3.2*   No results for input(s): LIPASE, AMYLASE in the last 168 hours. No results for input(s): AMMONIA in the last 168 hours.  Coagulation Profile: No results for input(s): INR, PROTIME in the last 168 hours.  Cardiac Enzymes: Recent Labs  Lab 06/17/20 1054 06/17/20 1738 06/18/20 0156  CKTOTAL 1,219* 1,220* 1,051*    BNP (last 3 results) No results for input(s): PROBNP in the last 8760 hours.  Lipid Profile: No results for input(s): CHOL, HDL, LDLCALC, TRIG, CHOLHDL, LDLDIRECT in the last 72 hours.  Thyroid Function Tests: Recent Labs    06/17/20 1056  TSH 2.144    Anemia Panel: No results for input(s): VITAMINB12, FOLATE, FERRITIN, TIBC, IRON, RETICCTPCT in the last 72 hours.  Urine  analysis:    Component Value Date/Time   COLORURINE AMBER (A) 06/17/2020 1738   APPEARANCEUR HAZY (A) 06/17/2020 1738   LABSPEC 1.019 06/17/2020 1738   PHURINE 5.0 06/17/2020 1738   GLUCOSEU 150 (A) 06/17/2020 1738   HGBUR NEGATIVE 06/17/2020 1738   BILIRUBINUR NEGATIVE 06/17/2020 1738   KETONESUR 5 (A) 06/17/2020 1738   PROTEINUR 30 (A) 06/17/2020 1738   NITRITE NEGATIVE 06/17/2020 1738   LEUKOCYTESUR NEGATIVE 06/17/2020 1738    Sepsis Labs: Lactic Acid, Venous    Component Value Date/Time   LATICACIDVEN 4.4 ([place]) 06/18/2020 0156    MICROBIOLOGY: Recent Results (from the past 240 hour(s))  Urine culture     Status: Abnormal   Collection Time: 06/17/20 11:46 AM   Specimen: Urine, Random  Result Value Ref Range Status   Specimen Description URINE, RANDOM  Final   Special Requests   Final    NONE Performed at [facility], [place] [address] [place], [place] [address]    Culture MULTIPLE SPECIES PRESENT, SUGGEST RECOLLECTION (A)  Final   Report Status 06/18/2020 FINAL  Final  Culture, blood (routine x 2)     Status: None (Preliminary result)   Collection Time: 06/17/20 12:09 PM   Specimen: BLOOD  Result Value Ref Range Status   Specimen Description BLOOD RIGHT ANTECUBITAL  Final   Special Requests   Final    BOTTLES DRAWN AEROBIC AND ANAEROBIC Blood Culture adequate volume   Culture   Final    NO GROWTH < 24 HOURS Performed at [facility], [place] [address], [place], [place] [address]    Report Status PENDING  Incomplete  Culture, blood (routine x 2)     Status: None (Preliminary result)   Collection Time: 06/17/20 12:19 PM   Specimen: BLOOD LEFT FOREARM  Result Value Ref Range Status   Specimen Description BLOOD LEFT FOREARM  Final   Special Requests   Final    BOTTLES DRAWN AEROBIC ONLY Blood Culture results may not be optimal due to an inadequate volume of blood received in culture bottles   Culture   Final    NO GROWTH < 24 HOURS Performed at [facility], [place] [address] [place], [place] [address]    Report Status PENDING  Incomplete  Respiratory Panel by RT PCR (Flu A&B, Covid) - Nasopharyngeal Swab     Status: None   Collection Time: 06/17/20 12:19 PM   Specimen: Nasopharyngeal Swab  Result Value Ref Range  Status   SARS Coronavirus 2 by RT PCR NEGATIVE NEGATIVE Final    Comment: (NOTE) SARS-CoV-2 target nucleic acids are NOT DETECTED.  The SARS-CoV-2 RNA is generally detectable in upper respiratoy specimens during the acute phase of infection. The lowest concentration of SARS-CoV-2 viral copies this assay can detect is 131 copies/mL. A negative result does not preclude SARS-Cov-2 infection and should not be used as the sole basis for treatment or other patient management decisions. A negative result may occur with  improper specimen collection/handling, submission of specimen other than nasopharyngeal swab, presence of viral mutation(s) within the areas targeted by this assay, and inadequate number of viral copies (<131 copies/mL). A negative result must be combined with clinical observations, patient history, and epidemiological information. The expected result is Negative.  Fact Sheet for Patients:  PinkCheek.be  Fact Sheet for Healthcare Providers:  GravelBags.it  This test is no t yet approved or cleared by the [place] FDA and  has been authorized for detection and/or diagnosis of SARS-CoV-2 by FDA under an Emergency Use Authorization (EUA). This EUA will remain  in effect (meaning this test can be used) for the duration of the COVID-19 declaration under Section 564(b)(1) of the Act, 21 U.S.C. section 360bbb-3(b)(1), unless the authorization is terminated or revoked sooner.     Influenza A by PCR NEGATIVE NEGATIVE Final   Influenza B by PCR NEGATIVE NEGATIVE Final    Comment: (NOTE) The Xpert Xpress SARS-CoV-2/FLU/RSV assay is intended as an  aid in  the diagnosis of influenza from Nasopharyngeal swab specimens and  should not be used as a sole basis for treatment. Nasal washings and  aspirates are unacceptable for Xpert Xpress SARS-CoV-2/FLU/RSV  testing.  Fact Sheet for Patients: PinkCheek.be  Fact Sheet for Healthcare Providers: GravelBags.it  This test is not yet approved or cleared by the [place] FDA and  has been authorized for detection and/or diagnosis of SARS-CoV-2 by  FDA under an Emergency Use Authorization (EUA). This EUA will remain  in effect (meaning this test can be used) for the duration of the  Covid-19 declaration under Section 564(b)(1) of the Act, 21  U.S.C. section 360bbb-3(b)(1), unless the authorization is  terminated or revoked. Performed at [facility], [place] [address], [place],  [address]     RADIOLOGY STUDIES/RESULTS: DG Chest 2 View  Result Date: 06/17/2020 CLINICAL DATA:  Status post fall. EXAM: CHEST - 2 VIEW COMPARISON:  November 09, 2018 FINDINGS: Enlarged cardiac silhouette. Calcific atherosclerotic disease and tortuosity of the aorta. Stable postsurgical changes at the thoracic inlet and aortic valve prosthesis noted. There is no evidence of focal airspace consolidation, pleural effusion or pneumothorax. Mild interstitial scarring as before. Osseous structures are without acute abnormality. Soft tissues are grossly normal. IMPRESSION: 1. No active cardiopulmonary disease. 2. Enlarged cardiac silhouette. 3. Calcific atherosclerotic disease and tortuosity of the aorta. Electronically Signed   By: Fidela Salisbury M.D.   On: 06/17/2020 11:59   DG Pelvis 1-2 Views  Result Date: 06/17/2020 CLINICAL DATA:  Fall EXAM: PELVIS - 1-2 VIEW COMPARISON:  October 22, 2018 FINDINGS: Osteopenia. No definitive fracture on this single radiograph. There is rotation of the RIGHT hip. Evaluation of the pubic ramus is limited secondary  to large rectal stool ball. Assimilation joint of LEFT L5-S1. Degenerative changes of the lower lumbar spine. Vascular calcifications. IMPRESSION: 1. No definitive fracture on this single radiograph. Recommend additional dedicated radiographs of the areas of clinical concern for improved evaluation. Electronically Signed  By: Valentino Saxon MD   On: 06/17/2020 12:01   CT Head Wo Contrast  Result Date: 06/17/2020 CLINICAL DATA:  Post head trauma. EXAM: CT HEAD WITHOUT CONTRAST TECHNIQUE: Contiguous axial images were obtained from the base of the skull through the vertex without intravenous contrast. COMPARISON:  CT of the head February 07, 2016 FINDINGS: Brain: No evidence of acute infarction, hemorrhage, hydrocephalus, extra-axial collection or mass lesion/mass effect. Insert full malacia from chronic left occipital infarct. Advanced brain parenchymal volume loss and moderate deep white matter microangiopathy. Vascular: Calcific atherosclerotic disease. Skull: Normal. Negative for fracture or focal lesion. Sinuses/Orbits: No acute finding. Other: None. IMPRESSION: 1. No acute intracranial abnormality. 2. Advanced brain parenchymal atrophy and chronic microvascular disease. 3. Chronic left occipital infarct. Electronically Signed   By: Fidela Salisbury M.D.   On: 06/17/2020 13:22   CT Cervical Spine Wo Contrast  Result Date: 06/17/2020 CLINICAL DATA:  Status post fall with neck trauma. EXAM: CT CERVICAL SPINE WITHOUT CONTRAST TECHNIQUE: Multidetector CT imaging of the cervical spine was performed without intravenous contrast. Multiplanar CT image reconstructions were also generated. COMPARISON:  None. FINDINGS: Alignment: Mild anterolisthesis of C6 on C7 and C7 on T1, likely degenerative. Skull base and vertebrae: No acute fracture. No primary bone lesion or focal pathologic process. Soft tissues and spinal canal: No prevertebral fluid or swelling. No visible canal hematoma. Disc levels:  Multilevel  osteoarthritic changes, moderate. Upper chest: Negative. Other: Motion degraded exam. IMPRESSION: 1. No evidence of acute traumatic injury to the cervical spine. 2. Multilevel osteoarthritic changes of the cervical spine. 3. Mild anterolisthesis of C6 on C7 and C7 on T1, likely degenerative. Electronically Signed   By: Fidela Salisbury M.D.   On: 06/17/2020 13:27     LOS: 1 day   Oren Binet, MD  [facility]    To contact the attending provider between 7A-7P or the covering provider during after hours 7P-7A, please log into the web site [email or web address] and access using universal [place] password for that web site. If you do not have the password, please call the hospital operator.  06/18/2020, 1:20 PM

## 2020-06-18 NOTE — Evaluation (Signed)
Physical Therapy Evaluation Patient Details Name: Alexa Mcdonald MRN: [id number] DOB: 1921-01-06 Today's Date: 06/18/2020   History of Present Illness  Pt is a 86 year old female who presented to the hospital with AMS after being found on the floor in the kitchen by staff at her independent living apartments at [facility], per chart. It is unclear how and when she fell. The patient is disoriented to dates and times at basline due to her dementia, per chart. CT negative for acute changes in head or cervical spine and radiographs negative for acute fractures. PMH significant for: tachycardia-bradycardia, stroke, aoritc stenosis, s/p TAVR, paroxysmal a-fib, osteoporosis, mitral regurgitation, CAD, LBBB, interstitial lung disease, hx of orthostatic hypotension, HOH, HTN, DM, CKD, chronic diastolic heart failure, dementia, and 1st degree AV block.  Clinical Impression  Pt is confused and was limited by fatigue and orthostatic BP changes during the session this date. She is a poor historian and family was not present to provide information on PLOF, home set-up, or prior cognitive status. However, per her chart she was living at an independent living apartment at [facility]. She has a history of falls. Due to her lethargy she required total assist to perform all bed mobility and maintain static sitting balance on the EOB. Her BP changed from 109/54 at start of session supine in bed to 96/65 once seated EOB, thus returned her to supine with BP reading 107/52. Pt was noted to display generalized weakness, balance deficits, and decreased activity tolerance limiting her independence and safety with all functional mobility. Will continue to follow acutely. Recommending SNF upon d/c secondary to her minimal participation and ability to care for herself at this time.    Follow Up Recommendations SNF;Supervision/Assistance - 24 hour    Equipment Recommendations  Wheelchair (measurements PT);Wheelchair cushion  (measurements PT);3in1 (PT);Hospital bed (may change as pt progresses)    Recommendations for Other Services       Precautions / Restrictions Precautions Precautions: Fall Precaution Comments: orthostatic BP, dementia Restrictions Weight Bearing Restrictions: No      Mobility  Bed Mobility Overal bed mobility: Needs Assistance Bed Mobility: Supine to Sit;Sit to Supine     Supine to sit: Total assist;HOB elevated;+2 for physical assistance Sit to supine: Total assist;+2 for physical assistance;HOB elevated   General bed mobility comments: Supine <> sit with cues for pt to manage LEs with no initiation noted, total assist to manage LEs and trunk.    Transfers                 General transfer comment: Deferred due to safety concerns  Ambulation/Gait             General Gait Details: Deferred due to safety concerns  Stairs            Wheelchair Mobility    Modified Rankin (Stroke Patients Only)       Balance Overall balance assessment: Needs assistance Sitting-balance support: Bilateral upper extremity supported;Feet supported Sitting balance-Leahy Scale: Zero Sitting balance - Comments: MaxA-total assist to maintain static sitting balance EOB as pt desiring to return to supine. Pt did not follow cues to lean trunk anteriorly. Postural control: Posterior lean     Standing balance comment: Deferred due to safety concerns                             Pertinent Vitals/Pain Pain Assessment: Faces Faces Pain Scale: Hurts a little bit Pain Location: generalized  with mobility Pain Descriptors / Indicators: Grimacing Pain Intervention(s): Limited activity within patient's tolerance;Monitored during session;Repositioned    Home Living Family/patient expects to be discharged to:: Assisted living               Home Equipment:  (unknown, pt poor historian) Additional Comments: Lived in independent living apartments at [facility], per  chart. Pt unable to answer questions about prior living arrangements this date, poor historian, dementia at baseline.    Prior Function Level of Independence: Independent (unknown if pt utilized AD/AE)         Comments: Lived in independent living apartments at [facility], per chart. Pt poor historian and unable to provide information on PLOF this date.     Hand Dominance        Extremity/Trunk Assessment   Upper Extremity Assessment Upper Extremity Assessment: Defer to OT evaluation    Lower Extremity Assessment Lower Extremity Assessment: Generalized weakness    Cervical / Trunk Assessment Cervical / Trunk Assessment: Kyphotic  Communication   Communication: No difficulties  Cognition Arousal/Alertness: Lethargic Behavior During Therapy: Anxious Overall Cognitive Status: No family/caregiver present to determine baseline cognitive functioning                                 General Comments: Per chart, pt  is disoriented to dates and times at basline due to her dementia. Pt able to state name and DOB, but stated "May 27, 1931" for current date. She was able to identify that she was in a hospital in [place], but unable to identify situation. Pt denies recent falls or L wrist injury. Pt anxious and wanting to return to supine once placed in sitting EOB, but would not state why she wanted to return to supine.      General Comments General comments (skin integrity, edema, etc.): Impaired skin integrity B feet; Vitals during session = HR in 50s-60s, SpO2 >/= 95%, BP 109/54 start of session supine with HOB elevated, 96/65 sitting EOB, 107/52 once returned to supine with HOB elevated    Exercises     Assessment/Plan    PT Assessment Patient needs continued PT services  PT Problem List Decreased strength;Decreased activity tolerance;Decreased balance;Decreased mobility;Decreased coordination;Decreased cognition;Decreased knowledge of use of DME;Decreased  safety awareness;Decreased knowledge of precautions;Cardiopulmonary status limiting activity;Decreased skin integrity       PT Treatment Interventions DME instruction;Gait training;Stair training;Functional mobility training;Therapeutic activities;Therapeutic exercise;Balance training;Neuromuscular re-education;Cognitive remediation;Patient/family education    PT Goals (Current goals can be found in the Care Plan section)  Acute Rehab PT Goals Patient Stated Goal: to lay back down PT Goal Formulation: With patient Time For Goal Achievement: 07/02/20 Potential to Achieve Goals: Fair    Frequency Min 3X/week   Barriers to discharge        Co-evaluation               AM-PAC PT "6 Clicks" Mobility  Outcome Measure Help needed turning from your back to your side while in a flat bed without using bedrails?: Total Help needed moving from lying on your back to sitting on the side of a flat bed without using bedrails?: Total Help needed moving to and from a bed to a chair (including a wheelchair)?: Total Help needed standing up from a chair using your arms (e.g., wheelchair or bedside chair)?: Total Help needed to walk in hospital room?: Total Help needed climbing 3-5 steps with a railing? : Total  6 Click Score: 6    End of Session Equipment Utilized During Treatment: Gait belt Activity Tolerance: Patient limited by lethargy;Patient limited by fatigue;Treatment limited secondary to medical complications (Comment) (orthostatic BP) Patient left: in bed;with call bell/phone within reach;with bed alarm set Nurse Communication: Mobility status;Precautions;Other (comment) (BP) PT Visit Diagnosis: Repeated falls (R29.6);History of falling (Z91.81);Muscle weakness (generalized) (M62.81);Difficulty in walking, not elsewhere classified (R26.2)    Time: 0175-1025 PT Time Calculation (min) (ACUTE ONLY): 21 min   Charges:   PT Evaluation $PT Eval Moderate Complexity: 1 Mod           Moishe Spice, PT, DPT Acute Rehabilitation Services  Pager: [phone] Office: [phone]   Orvan Falconer 06/18/2020, 11:11 AM

## 2020-06-18 NOTE — Care Management Important Message (Signed)
Important Message  Patient Details  Name: Alexa Mcdonald MRN: [id number] Date of Birth: 24-Sep-1920   Medicare Important Message Given:  Yes     Shelda Altes 06/18/2020, 10:47 AM

## 2020-06-19 DIAGNOSIS — I63439 Cerebral infarction due to embolism of unspecified posterior cerebral artery: Secondary | ICD-10-CM | POA: Diagnosis not present

## 2020-06-19 DIAGNOSIS — I4819 Other persistent atrial fibrillation: Secondary | ICD-10-CM | POA: Diagnosis not present

## 2020-06-19 DIAGNOSIS — M6282 Rhabdomyolysis: Secondary | ICD-10-CM | POA: Diagnosis not present

## 2020-06-19 DIAGNOSIS — N179 Acute kidney failure, unspecified: Secondary | ICD-10-CM | POA: Diagnosis not present

## 2020-06-19 LAB — COMPREHENSIVE METABOLIC PANEL
ALT: 244 U/L — ABNORMAL HIGH (ref 0–44)
AST: 181 U/L — ABNORMAL HIGH (ref 15–41)
Albumin: 2.7 g/dL — ABNORMAL LOW (ref 3.5–5.0)
Alkaline Phosphatase: 80 U/L (ref 38–126)
Anion gap: 13 (ref 5–15)
BUN: 37 mg/dL — ABNORMAL HIGH (ref 8–23)
CO2: 29 mmol/L (ref 22–32)
Calcium: 7.4 mg/dL — ABNORMAL LOW (ref 8.9–10.3)
Chloride: 100 mmol/L (ref 98–111)
Creatinine, Ser: 1.96 mg/dL — ABNORMAL HIGH (ref 0.44–1.00)
GFR, Estimated: 23 mL/min — ABNORMAL LOW (ref >60)
Glucose, Bld: 134 mg/dL — ABNORMAL HIGH (ref 70–99)
Potassium: 3.4 mmol/L — ABNORMAL LOW (ref 3.5–5.1)
Sodium: 142 mmol/L (ref 135–145)
Total Bilirubin: 1.1 mg/dL (ref 0.3–1.2)
Total Protein: 5.4 g/dL — ABNORMAL LOW (ref 6.5–8.1)

## 2020-06-19 LAB — CBC
HCT: 37.4 % (ref 36.0–46.0)
Hemoglobin: 12.6 g/dL (ref 12.0–15.0)
MCH: 31.8 pg (ref 26.0–34.0)
MCHC: 33.7 g/dL (ref 30.0–36.0)
MCV: 94.4 fL (ref 80.0–100.0)
Platelets: 164 10*3/uL (ref 150–400)
RBC: 3.96 MIL/uL (ref 3.87–5.11)
RDW: 13.8 % (ref 11.5–15.5)
WBC: 16.5 10*3/uL — ABNORMAL HIGH (ref 4.0–10.5)
nRBC: 0 % (ref 0.0–0.2)

## 2020-06-19 LAB — GLUCOSE, CAPILLARY
Glucose-Capillary: 123 mg/dL — ABNORMAL HIGH (ref 70–99)
Glucose-Capillary: 140 mg/dL — ABNORMAL HIGH (ref 70–99)
Glucose-Capillary: 114 mg/dL — ABNORMAL HIGH (ref 70–99)
Glucose-Capillary: 123 mg/dL — ABNORMAL HIGH (ref 70–99)

## 2020-06-19 LAB — CK: Total CK: 708 U/L — ABNORMAL HIGH (ref 38–234)

## 2020-06-19 MED ORDER — POLYETHYLENE GLYCOL 3350 17 G PO PACK
17.0000 g | PACK | Freq: Two times a day (BID) | ORAL | Status: DC
  Administered 2020-06-19: 17 g via ORAL
  Filled 2020-06-19: qty 1

## 2020-06-19 MED ORDER — POTASSIUM CHLORIDE CRYS ER 20 MEQ PO TBCR
40.0000 meq | EXTENDED_RELEASE_TABLET | Freq: Once | ORAL | Status: AC
  Administered 2020-06-19: 40 meq via ORAL
  Filled 2020-06-19: qty 2

## 2020-06-19 MED ORDER — SENNOSIDES-DOCUSATE SODIUM 8.6-50 MG PO TABS
2.0000 | ORAL_TABLET | Freq: Every day | ORAL | Status: DC
  Administered 2020-06-19: 2 via ORAL
  Filled 2020-06-19: qty 2

## 2020-06-19 MED ORDER — LACTULOSE 10 GM/15ML PO SOLN
20.0000 g | Freq: Once | ORAL | Status: AC
  Administered 2020-06-19: 20 g via ORAL
  Filled 2020-06-19: qty 30

## 2020-06-19 MED ORDER — FUROSEMIDE 10 MG/ML IJ SOLN
40.0000 mg | Freq: Once | INTRAMUSCULAR | Status: DC
Start: 2020-06-19 — End: 2020-06-19

## 2020-06-19 MED ORDER — POLYETHYLENE GLYCOL 3350 17 G PO PACK
17.0000 g | PACK | Freq: Three times a day (TID) | ORAL | Status: DC
  Administered 2020-06-19 (×2): 17 g via ORAL
  Filled 2020-06-19 (×3): qty 1

## 2020-06-19 NOTE — TOC Initial Note (Signed)
Transition of Care [facility]) - Initial/Assessment Note    Patient Details  Name: Alexa Mcdonald MRN: [id number] Date of Birth: 1921/06/04  Transition of Care [facility]) CM/SW Contact:    Vinie Sill, Arthur Phone Number: 06/19/2020, 3:05 PM  Clinical Narrative:                  CSW spoke with the patient's son,[name] via phone. CSW introduced self and explained role. He confirmed patient was from [place] but they are looking into [place]. He was under the impression patient would receive rehab at [facility]. CSW advised she will have to get rehab at a SNF before discharging back to [facility]. He was agreeable to SNF placement. CSW explained the SNF process. No questions or concerns noted at this time.   CSW will provide bed offers once available CSW will continue to follow and assist with discharge planning.  Thurmond Butts, MSW, [place] Clinical Social Worker   Expected Discharge Plan: Skilled Nursing Facility Barriers to Discharge: Continued Medical Work up, SNF Pending bed offer   Patient Goals and CMS Choice        Expected Discharge Plan and Services Expected Discharge Plan: [place] In-house Referral: Clinical Social Work                                            Prior Living Arrangements/Services     Patient language and need for interpreter reviewed:: No        Need for Family Participation in Patient Care: Yes (Comment) Care giver support system in place?: Yes (comment)   Criminal Activity/Legal Involvement Pertinent to Current Situation/Hospitalization: No - Comment as needed  Activities of Daily Living      Permission Sought/Granted Permission sought to share information with : Family Supports                Emotional Assessment   Attitude/Demeanor/Rapport: Unable to Assess Affect (typically observed): Unable to Assess Orientation: : Oriented to Self Alcohol / Substance Use: Not Applicable Psych Involvement:  No (comment)  Admission diagnosis:  Rhabdomyolysis [M62.82] Lactic acidosis [E87.2] Hyperglycemia [R73.9] AKI (acute kidney injury) ([place]) [N17.9] Fall, initial encounter [W19.XXXA] Traumatic rhabdomyolysis, initial encounter ([place]) [T79.6XXA] Patient Active Problem List   Diagnosis Date Noted  . Rhabdomyolysis 06/17/2020  . Lactic acidosis 06/17/2020  . Diabetes mellitus ([place])   . Iron deficiency anemia 12/05/2018  . Duodenal ulcer 12/05/2018  . Gastric erosion 12/05/2018  . History of cholecystectomy 12/05/2018  . History of ERCP 12/05/2018  . Iron deficiency anemia due to chronic blood loss   . Erosive gastritis   . Acute lower GI bleeding 11/09/2018  . Acute blood loss anemia 11/09/2018  . Symptomatic anemia 11/09/2018  . Choledocholithiasis with obstruction 10/23/2018  . Occipital stroke ([place]) 06/11/2016  . Paroxysmal atrial fibrillation ([place]) 06/11/2016  . Homonymous hemianopsia due to old embolic stroke 26/94/8546  . Loss of peripheral visual field 03/12/2016  . Ataxia 03/12/2016  . Dizziness and giddiness 03/12/2016  . Cerebrovascular accident (CVA) due to embolism of posterior cerebral artery with infarctions of both occipital lobes ([place]) 03/12/2016  . Acute on chronic diastolic CHF (congestive heart failure) ([place]) 02/07/2016  . 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 Severe aortic stenosis   . Atrial fibrillation, persistent ([place])   . Mitral regurgitation   . Interstitial lung  disease ([place]) 10/30/2014  . Hematochezia 12/13/2013  . CKD (chronic kidney disease), stage III ([place]) 12/13/2013  . Hyperthyroidism 12/13/2013  . GERD (gastroesophageal reflux disease) 12/13/2013  . Chronic anticoagulation 12/13/2013  . Elevated brain natriuretic peptide (BNP) level 12/13/2013  . Sinus bradycardia 12/13/2013  . Pneumonia 12/13/2013  . Encounter for therapeutic drug monitoring 08/29/2013  . Essential hypertension 08/05/2013  . Edema of both ankles  08/05/2013  . Bradycardia 11/27/2011   PCP:  Jonathon Jordan, MD Pharmacy:   [facility] [address], [place] - [place] [facility] [place] [address] [place] [address] Phone: [phone] Fax: [phone]     Social Determinants of Health (SDOH) Interventions    Readmission Risk Interventions No flowsheet data found.

## 2020-06-19 NOTE — Evaluation (Signed)
Occupational Therapy Evaluation Patient Details Name: Alexa Mcdonald MRN: [id number] DOB: 05-26-1921 Today's Date: 06/19/2020    History of Present Illness Pt is a 86 year old female who presented to the hospital with AMS after being found on the floor in the kitchen by staff at her independent living apartments at [facility], per chart. It is unclear how and when she fell. The patient is disoriented to dates and times at [place]line due to her dementia, per chart. CT negative for acute changes in head or cervical spine and radiographs negative for acute fractures. PMH significant for: tachycardia-bradycardia, stroke, aoritc stenosis, s/p TAVR, paroxysmal a-fib, osteoporosis, mitral regurgitation, CAD, LBBB, interstitial lung disease, hx of orthostatic hypotension, HOH, HTN, DM, CKD, chronic diastolic heart failure, dementia, and 1st degree AV block.   Clinical Impression   PTA, pt was living at [facility] independent living and performed BADLs; information from chart review as pt poor historian. Pt currently requiring Min A for UB ADLs, Max A for LB ADLs, and Min-Max A for functional transfer. Pt with significant fatigue, poor cognition, and decreased balance and strength. Pt would benefit from further acute OT to facilitate safe dc. Recommend dc to SNF for further OT to optimize safety, independence with ADLs, and return to PLOF.   SpO2 90s on RA. HR 50-60s. BP supine 112/60 (75), sitting at EOB 117/58 (77), and sitting in recliner 109/59 (74). Noting wet cough with raspy cough.    Follow Up Recommendations  SNF    Equipment Recommendations  Other (comment) (Defer to next venue)    Recommendations for Other Services PT consult     Precautions / Restrictions Precautions Precautions: Fall Precaution Comments: orthostatic BP, dementia      Mobility Bed Mobility Overal bed mobility: Needs Assistance Bed Mobility: Supine to Sit     Supine to sit: Min assist     General bed mobility  comments: Min A for bringing BLEs and then pull up into sitting    Transfers Overall transfer level: Needs assistance Equipment used: 1 person hand held assist Transfers: Sit to/from [facility] Sit to Stand: Min assist Stand pivot transfers: Max assist;+2 safety/equipment       General transfer comment: Min A to power up. Max A for pivot to recliner; poor balance and strength.     Balance Overall balance assessment: Needs assistance Sitting-balance support: No upper extremity supported;Feet supported Sitting balance-Leahy Scale: Fair Sitting balance - Comments: Slight right lean; fatigue. Able to correct with tactile cues Postural control: Right lateral lean Standing balance support: Bilateral upper extremity supported;During functional activity Standing balance-Leahy Scale: Poor Standing balance comment: Reliant on physical A                           ADL either performed or assessed with clinical judgement   ADL Overall ADL's : Needs assistance/impaired Eating/Feeding: Set up;Sitting;Supervision/ safety   Grooming: Set up;Supervision/safety;Sitting   Upper Body Bathing: Minimal assistance;Sitting   Lower Body Bathing: Maximal assistance;Sit to/from stand   Upper Body Dressing : Minimal assistance;Sitting   Lower Body Dressing: Maximal assistance;Sit to/from stand   Toilet Transfer: Minimal assistance;Maximal assistance;Stand-pivot (simulated to recliner) Toilet Transfer Details (indicate cue type and reason): Min A for power up from EOB and then Max A to pivot to recliner due to decreased balance and sequencing of BLEs. Pt also with increased fear of fall.         Functional mobility during ADLs: Maximal assistance (  stand pivot) General ADL Comments: Pt presenting  with poor activity tolerance, balance, strength, and cognition.      Vision         Perception     Praxis      Pertinent Vitals/Pain Pain Assessment: Faces Faces  Pain Scale: Hurts a little bit Pain Location: generalized with mobility Pain Descriptors / Indicators: Grimacing Pain Intervention(s): Monitored during session;Limited activity within patient's tolerance;Repositioned     Hand Dominance Right   Extremity/Trunk Assessment Upper Extremity Assessment Upper Extremity Assessment: Generalized weakness   Lower Extremity Assessment Lower Extremity Assessment: Defer to PT evaluation   Cervical / Trunk Assessment Cervical / Trunk Assessment: Kyphotic   Communication Communication Communication: No difficulties   Cognition Arousal/Alertness: Awake/alert Behavior During Therapy: WFL for tasks assessed/performed Overall Cognitive Status: Impaired/Different from baseline                                 General Comments: Pt with baseline dementia. Unsure of baseline cognitive level. Pt able to state she is from [place]. Pt stating " think I am in a hospital". Able to problem solve that since she was at [facility] (oriented at therapist) that she was in [place].   General Comments  SpO2 90s on RA. HR 50-60s. BP supine 112/60 (75), sitting at EOB 117/58 (77), and sitting in recliner 109/59 (74). Noting wet cough with raspy cough.    Exercises     Shoulder Instructions      Home Living Family/patient expects to be discharged to:: Other (Comment) (Independent living at [facility] per chart)                                 Additional Comments: Pt able to say she lives in an apartment and lives in [place]. Doesn't recognize name of [place]. Unable to provide home details, support level, DMR, and PLOF due to poor historian.       Prior Functioning/Environment          Comments: Pt reports she was independent with her ADLs and simple IADLs. Poor historian so unsure of accuracy.        OT Problem List: Decreased strength;Decreased range of motion;Decreased activity tolerance;Impaired balance (sitting  and/or standing);Decreased cognition;Decreased knowledge of use of DME or AE;Decreased knowledge of precautions      OT Treatment/Interventions: Self-care/ADL training;Therapeutic exercise;Energy conservation;DME and/or AE instruction;Therapeutic activities;Patient/family education    OT Goals(Current goals can be found in the care plan section) Acute Rehab OT Goals Patient Stated Goal: Go pee OT Goal Formulation: With patient Time For Goal Achievement: 07/03/20 Potential to Achieve Goals: Good  OT Frequency: Min 2X/week   Barriers to D/C:            Co-evaluation              AM-PAC OT "6 Clicks" Daily Activity     Outcome Measure Help from another person eating meals?: A Little Help from another person taking care of personal grooming?: A Little Help from another person toileting, which includes using toliet, bedpan, or urinal?: A Lot Help from another person bathing (including washing, rinsing, drying)?: A Lot Help from another person to put on and taking off regular upper body clothing?: A Little Help from another person to put on and taking off regular lower body clothing?: A Lot 6 Click Score: 15   End  of Session Equipment Utilized During Treatment: Gait belt Nurse Communication: Mobility status  Activity Tolerance: Patient limited by fatigue Patient left: in chair;with call bell/phone within reach;with chair alarm set  OT Visit Diagnosis: Unsteadiness on feet (R26.81);Other abnormalities of gait and mobility (R26.89);Muscle weakness (generalized) (M62.81)                Time: 4360-6770 OT Time Calculation (min): 35 min Charges:  OT General Charges $OT Visit: 1 Visit OT Evaluation $OT Eval Moderate Complexity: 1 Mod OT Treatments $Self Care/Home Management : 8-22 mins  Lachlan Mckim MSOT, OTR/L Acute Rehab Pager: [phone] Office: [place] 06/19/2020, 9:01 AM

## 2020-06-19 NOTE — NC FL2 (Signed)
[place] LEVEL OF CARE SCREENING TOOL     IDENTIFICATION  Patient Name: Alexa Mcdonald Birthdate: 1920-08-23 Sex: female Admission Date (Current Location): 06/17/2020  [facility] and [place] Number:  Herbalist and Address:  [facility]. [facility], [place] [address], [place], [place] [address]      Provider Number: [id number]  Attending Physician Name and Address:  Jonetta Osgood, MD  Relative Name and Phone Number:       Current Level of Care: Hospital Recommended Level of Care: [place] Prior Approval Number:    Date Approved/Denied:   PASRR Number: [id number]  Discharge Plan: SNF    Current Diagnoses: Patient Active Problem List   Diagnosis Date Noted  . Rhabdomyolysis 06/17/2020  . Lactic acidosis 06/17/2020  . Diabetes mellitus ([place])   . Iron deficiency anemia 12/05/2018  . Duodenal ulcer 12/05/2018  . Gastric erosion 12/05/2018  . History of cholecystectomy 12/05/2018  . History of ERCP 12/05/2018  . Iron deficiency anemia due to chronic blood loss   . Erosive gastritis   . Acute lower GI bleeding 11/09/2018  . Acute blood loss anemia 11/09/2018  . Symptomatic anemia 11/09/2018  . Choledocholithiasis with obstruction 10/23/2018  . Occipital stroke ([place]) 06/11/2016  . Paroxysmal atrial fibrillation ([place]) 06/11/2016  . 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 Loss of peripheral visual field 03/12/2016  . Ataxia 03/12/2016  . Dizziness and giddiness 03/12/2016  . Cerebrovascular accident (CVA) due to embolism of posterior cerebral artery with infarctions of both occipital lobes ([place]) 03/12/2016  . Acute on chronic diastolic CHF (congestive heart failure) ([place]) 02/07/2016  . 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 Severe aortic stenosis   . Atrial fibrillation, persistent ([place])   . Mitral regurgitation   . Interstitial lung disease ([place]) 10/30/2014  .  Hematochezia 12/13/2013  . CKD (chronic kidney disease), stage III ([place]) 12/13/2013  . Hyperthyroidism 12/13/2013  . GERD (gastroesophageal reflux disease) 12/13/2013  . Chronic anticoagulation 12/13/2013  . Elevated brain natriuretic peptide (BNP) level 12/13/2013  . Sinus bradycardia 12/13/2013  . Pneumonia 12/13/2013  . Encounter for therapeutic drug monitoring 08/29/2013  . Essential hypertension 08/05/2013  . Edema of both ankles 08/05/2013  . Bradycardia 11/27/2011    Orientation RESPIRATION BLADDER Height & Weight     Self  Normal External catheter, Incontinent Weight: 128 lb 1.4 oz (58.1 kg) Height:  5' (152.4 cm)  BEHAVIORAL SYMPTOMS/MOOD NEUROLOGICAL BOWEL NUTRITION STATUS      Continent Diet (please see discharge summary)  AMBULATORY STATUS COMMUNICATION OF NEEDS Skin   Limited Assist Verbally Normal                       Personal Care Assistance Level of Assistance  Bathing, Dressing, Feeding Bathing Assistance: Limited assistance Feeding assistance: Limited assistance Dressing Assistance: Limited assistance     Functional Limitations Info  Sight, Hearing, Speech Sight Info: Impaired Hearing Info: Impaired Speech Info: Adequate    SPECIAL CARE FACTORS FREQUENCY  PT (By licensed PT), OT (By licensed OT)     PT Frequency: 5x per wwwk OT Frequency: 5x per week            Contractures Contractures Info: Not present    Additional Factors Info  Code Status, Allergies, Insulin Sliding Scale Code Status Info: DNR Allergies Info: Amoxicillin,Celebrex   Insulin Sliding Scale Info: see discharge summary  Current Medications (06/19/2020):  This is the current hospital active medication list Current Facility-Administered Medications  Medication Dose Route Frequency Provider Last Rate Last Admin  . 0.9 %  sodium chloride infusion   Intravenous Continuous Jonetta Osgood, MD 10 mL/hr at 06/19/20 1110 Rate Change at 06/19/20 1110  .  acetaminophen (TYLENOL) tablet 650 mg  650 mg Oral Q6H PRN Vashti Hey, MD       Or  . acetaminophen (TYLENOL) suppository 650 mg  650 mg Rectal Q6H PRN Bonnell Public Tublu, MD      . insulin aspart (novoLOG) injection 0-9 Units  0-9 Units Subcutaneous TID WC Jonetta Osgood, MD   1 Units at 06/19/20 1346  . insulin glargine (LANTUS) injection 8 Units  8 Units Subcutaneous Daily Jonetta Osgood, MD   8 Units at 06/19/20 1054  . methimazole (TAPAZOLE) tablet 5 mg  5 mg Oral Q breakfast Jonetta Osgood, MD   5 mg at 06/19/20 1054  . pantoprazole (PROTONIX) EC tablet 40 mg  40 mg Oral Daily Bonnell Public Tublu, MD   40 mg at 06/19/20 1054  . polyethylene glycol (MIRALAX / GLYCOLAX) packet 17 g  17 g Oral TID Jonetta Osgood, MD   17 g at 06/19/20 1524  . senna-docusate (Senokot-S) tablet 2 tablet  2 tablet Oral QHS Ghimire, Shanker M, MD      . vitamin B-12 (CYANOCOBALAMIN) tablet 1,000 mcg  1,000 mcg Oral Q breakfast Jonetta Osgood, MD   1,000 mcg at 06/19/20 1054     Discharge Medications: Please see discharge summary for a list of discharge medications.  Relevant Imaging Results:  Relevant Lab Results:   Additional Information [id number]  Vinie Sill, LCSWA

## 2020-06-19 NOTE — Progress Notes (Signed)
PROGRESS NOTE        PATIENT DETAILS Name: Alexa Mcdonald Age: 86 y.o. Sex: female Date of Birth: 1921/07/26 Admit Date: 06/17/2020 Admitting Physician Vashti Hey, MD [id number], Ivin Booty, MD  Brief Narrative: Patient is a 86 y.o. female advanced dementia, HTN, atrial fibrillation on anticoagulation, CKD stage III, HFpEF, aortic stenosis s/p TAVR found at her independent living facility on the floor-found to have AKI and rhabdomyolysis.  Subsequently admitted to the hospitalist service.  See below for further details.  Significant events: 11/14>> found on the floor in the kitchen at independent living-found to have rhabdomyolysis, AKI.  Significant studies: 11/14>> CT head: No acute intracranial abnormality. 11/14>> CT C-spine: No acute traumatic injury. 11/14>> x-ray pelvis: No fracture seen. 11/14>> chest x-ray: No acute cardiopulmonary disease.  Antimicrobial therapy: Vancomycin: 11/14>> 11/16 Cefepime: 11/14>> 11/16  Microbiology data: 11/14>> blood culture: No growth  Procedures : None  Consults: None  DVT Prophylaxis : SCDs Start: 06/17/20 1509  SQ heparin  Subjective: Remains pleasantly confused.  Assessment/Plan: Rhabdomyolysis: Appears to be mild-secondary to being down on the floor in the kitchen for an unknown period of time.  CK downtrending-stop IV fluids today.  Continue supportive care.  Transaminitis: Likely secondary to above-repeat enzymes tomorrow morning.  AKI on CKD stage IIIa: AKI likely hemodynamically mediated, rhabdomyolysis and possibly osmotic diuresis due to hyperglycemia.  Improving with supportive care.  Volume status stable-KVO all IV fluids.  Repeat electrolytes tomorrow.  DM-2 with uncontrolled hyperglycemia (A1c 7.3 on 11/14): Does not appear to be on any oral hypoglycemic agents in the outpatient setting-start Lantus 8 units, change SSI to sensitive scale.  Follow and adjust.  Recent Labs      06/18/20 1607 06/18/20 2106 06/19/20 0641  GLUCAP 175* 129* 123*   Chronic atrial fibrillation: Rate controlled without any rate control agents-Eliquis on hold-very elderly-frail-advanced dementia-brought in for a fall-we will discuss risk/benefits with her family before reinitiating anticoagulation-left a voicemail for patient's son.[name]  History of aortic stenosis-s/p TAVR  History of chronic diastolic heart failure: Volume status stable-hold diuretics.  History of CVA: Moving all 4 extremities-Eliquis on hold due to fall-awaiting further risk/benefits discussion with family before reinitiating anticoagulation-left voicemail for patient's son  History of erosive gastritis: Continue PPI  Leukocytosis: Unclear etiology-blood cultures/UA/chest x-ray negative for infection-no obvious etiology seen on exam.  Could be related to rhabdomyolysis/soft tissue compression from a fall.  WBC downtrending.  Cultures negative-stop all antimicrobial therapy.  Hypothyroidism: Continue methimazole  Dementia: Pleasantly confused-maintain delirium precautions  Fall: Not sure if she syncopized or this was a mechanical fall-continue telemetry monitoring-await PT/OT eval.  Mild lactic acidosis: Unclear etiology-clinical features features not consistent with sepsis.  Has downgraded with supportive care.  Constipation/fecal impaction: Significant stool collection seen on imaging studies-Per nursing staff-had BM on 11/14-not sure why only 1 dose of MiraLAX was given yesterday-repeat 3 doses of MiraLAX-continue senna-we will give 1 dose of lactulose as well.  Reassess tomorrow.  Fall/debility/deconditioning: Very elderly/frail-suspect significant amount of debility at baseline-lives at independent living-probably will require SNF on discharge.  Appreciate rehab services eval-we will consult social work.   Diet: Diet Order            Diet Carb Modified Fluid consistency: Thin; Room service appropriate? No   Diet effective now  Code Status:  DNR  Family Communication: Son-[name] ([phone])-left voicemail on 11/16  Disposition Plan: Status is: Inpatient  Remains inpatient appropriate because:Inpatient level of care appropriate due to severity of illness   Dispo: The patient is from: Home              Anticipated d/c is to: SNF              Anticipated d/c date is: 2 days              Patient currently is not medically stable to d/c.   Barriers to Discharge: Rhabdomyolysis-worsening AKi-likely will need SNF on discharge  Antimicrobial agents: Anti-infectives (From admission, onward)   Start     Dose/Rate Route Frequency Ordered Stop   06/19/20 1800  vancomycin (VANCOREADY) IVPB 750 mg/150 mL  Status:  Discontinued        750 mg 150 mL/hr over 60 Minutes Intravenous Every 48 hours 06/17/20 1751 06/19/20 0805   06/17/20 1800  ceFEPIme (MAXIPIME) 2 g in sodium chloride 0.9 % 100 mL IVPB        2 g 200 mL/hr over 30 Minutes Intravenous Every 24 hours 06/17/20 1700 06/20/20 1759   06/17/20 1800  vancomycin (VANCOCIN) IVPB 1000 mg/200 mL premix        1,000 mg 200 mL/hr over 60 Minutes Intravenous  Once 06/17/20 1700 06/17/20 2030       Time spent: 25- minutes-Greater than 50% of this time was spent in counseling, explanation of diagnosis, planning of further management, and coordination of care.  MEDICATIONS: Scheduled Meds: . insulin aspart  0-9 Units Subcutaneous TID WC  . insulin glargine  8 Units Subcutaneous Daily  . methimazole  5 mg Oral Q breakfast  . pantoprazole  40 mg Oral Daily  . polyethylene glycol  17 g Oral BID  . senna-docusate  2 tablet Oral QHS  . vitamin B-12  1,000 mcg Oral Q breakfast   Continuous Infusions: . sodium chloride 10 mL/hr at 06/19/20 1110  . ceFEPime (MAXIPIME) IV 2 g (06/18/20 1737)   PRN Meds:.acetaminophen **OR** acetaminophen   PHYSICAL EXAM: Vital signs: Vitals:   06/18/20 1951 06/18/20 2339 06/19/20  0429 06/19/20 0735  BP: 114/69 (!) 111/56 (!) 105/56 104/60  Pulse: 65 73 60 (!) 55  Resp: 19 16 20 19   Temp: 98.5 F (36.9 C) 98.5 F (36.9 C) 98.5 F (36.9 C) 98.5 F (36.9 C)  TempSrc: Oral Oral Oral Oral  SpO2: 100% 100% 100% 99%  Weight:      Height:       Filed Weights   06/17/20 1630 06/17/20 2054  Weight: 59.9 kg 58.1 kg   Body mass index is 25.02 kg/m.   Gen Exam: Pleasantly confused-not in any distress HEENT:atraumatic, normocephalic Chest: B/L clear to auscultation anteriorly CVS:S1S2 regular Abdomen:soft non tender, non distended Extremities:no edema Neurology: Non focal Skin: no rash I have personally reviewed following labs and imaging studies  LABORATORY DATA: CBC: Recent Labs  Lab 06/17/20 1054 06/17/20 1312 06/18/20 0156 06/19/20 0148  WBC 21.5*  --  19.5* 16.5*  NEUTROABS 19.4*  --   --   --   HGB 12.0 12.2 12.6 12.6  HCT 37.4 36.0 38.5 37.4  MCV 99.5  --  96.5 94.4  PLT 235  --  176 229    Basic Metabolic Panel: Recent Labs  Lab 06/17/20 1054 06/17/20 1312 06/17/20 1738 06/18/20 0156 06/19/20 0148  NA 137 139 138 139 142  K 4.4 3.9 4.3 4.2 3.4*  CL 100  --  103 102 100  CO2 19*  --  20* 24 29  GLUCOSE 449*  --  384* 302* 134*  BUN 33*  --  34* 37* 37*  CREATININE 2.17*  --  2.28* 2.34* 1.96*  CALCIUM 8.7*  --  8.6* 8.1* 7.4*    GFR: Estimated Creatinine Clearance: 13.1 mL/min (A) (by C-G formula based on SCr of 1.96 mg/dL (H)).  Liver Function Tests: Recent Labs  Lab 06/17/20 1054 06/19/20 0148  AST 51* 181*  ALT 20 244*  ALKPHOS 94 80  BILITOT 1.5* 1.1  PROT 5.8* 5.4*  ALBUMIN 3.2* 2.7*   No results for input(s): LIPASE, AMYLASE in the last 168 hours. No results for input(s): AMMONIA in the last 168 hours.  Coagulation Profile: No results for input(s): INR, PROTIME in the last 168 hours.  Cardiac Enzymes: Recent Labs  Lab 06/17/20 1054 06/17/20 1738 06/18/20 0156 06/19/20 0148  CKTOTAL 1,219* 1,220*  1,051* 708*    BNP (last 3 results) No results for input(s): PROBNP in the last 8760 hours.  Lipid Profile: No results for input(s): CHOL, HDL, LDLCALC, TRIG, CHOLHDL, LDLDIRECT in the last 72 hours.  Thyroid Function Tests: Recent Labs    06/17/20 1056  TSH 2.144    Anemia Panel: No results for input(s): VITAMINB12, FOLATE, FERRITIN, TIBC, IRON, RETICCTPCT in the last 72 hours.  Urine analysis:    Component Value Date/Time   COLORURINE AMBER (A) 06/17/2020 1738   APPEARANCEUR HAZY (A) 06/17/2020 1738   LABSPEC 1.019 06/17/2020 1738   PHURINE 5.0 06/17/2020 1738   GLUCOSEU 150 (A) 06/17/2020 1738   HGBUR NEGATIVE 06/17/2020 1738   BILIRUBINUR NEGATIVE 06/17/2020 1738   KETONESUR 5 (A) 06/17/2020 1738   PROTEINUR 30 (A) 06/17/2020 1738   NITRITE NEGATIVE 06/17/2020 1738   LEUKOCYTESUR NEGATIVE 06/17/2020 1738    Sepsis Labs: Lactic Acid, Venous    Component Value Date/Time   LATICACIDVEN 4.4 ([place]) 06/18/2020 0156    MICROBIOLOGY: Recent Results (from the past 240 hour(s))  Urine culture     Status: Abnormal   Collection Time: 06/17/20 11:46 AM   Specimen: Urine, Random  Result Value Ref Range Status   Specimen Description URINE, RANDOM  Final   Special Requests   Final    NONE Performed at [facility], [place] [address], [place], [place] [address]    Culture MULTIPLE SPECIES PRESENT, SUGGEST RECOLLECTION (A)  Final   Report Status 06/18/2020 FINAL  Final  Culture, blood (routine x 2)     Status: None (Preliminary result)   Collection Time: 06/17/20 12:09 PM   Specimen: BLOOD  Result Value Ref Range Status   Specimen Description BLOOD RIGHT ANTECUBITAL  Final   Special Requests   Final    BOTTLES DRAWN AEROBIC AND ANAEROBIC Blood Culture adequate volume   Culture   Final    NO GROWTH < 24 HOURS Performed at [facility], [place] [address] [place], [place] [address]    Report Status PENDING  Incomplete  Culture, blood (routine x 2)     Status:  None (Preliminary result)   Collection Time: 06/17/20 12:19 PM   Specimen: BLOOD LEFT FOREARM  Result Value Ref Range Status   Specimen Description BLOOD LEFT FOREARM  Final   Special Requests   Final    BOTTLES DRAWN AEROBIC ONLY Blood Culture results may not be optimal due to an inadequate volume of blood  received in culture bottles   Culture   Final    NO GROWTH < 24 HOURS Performed at [facility], [place] [address], [place], [place] [address]    Report Status PENDING  Incomplete  Respiratory Panel by RT PCR (Flu A&B, Covid) - Nasopharyngeal Swab     Status: None   Collection Time: 06/17/20 12:19 PM   Specimen: Nasopharyngeal Swab  Result Value Ref Range Status   SARS Coronavirus 2 by RT PCR NEGATIVE NEGATIVE Final    Comment: (NOTE) SARS-CoV-2 target nucleic acids are NOT DETECTED.  The SARS-CoV-2 RNA is generally detectable in upper respiratoy specimens during the acute phase of infection. The lowest concentration of SARS-CoV-2 viral copies this assay can detect is 131 copies/mL. A negative result does not preclude SARS-Cov-2 infection and should not be used as the sole basis for treatment or other patient management decisions. A negative result may occur with  improper specimen collection/handling, submission of specimen other than nasopharyngeal swab, presence of viral mutation(s) within the areas targeted by this assay, and inadequate number of viral copies (<131 copies/mL). A negative result must be combined with clinical observations, patient history, and epidemiological information. The expected result is Negative.  Fact Sheet for Patients:  PinkCheek.be  Fact Sheet for Healthcare Providers:  GravelBags.it  This test is no t yet approved or cleared by the [place] FDA and  has been authorized for detection and/or diagnosis of SARS-CoV-2 by FDA under an Emergency Use Authorization (EUA). This EUA will  remain  in effect (meaning this test can be used) for the duration of the COVID-19 declaration under Section 564(b)(1) of the Act, 21 U.S.C. section 360bbb-3(b)(1), unless the authorization is terminated or revoked sooner.     Influenza A by PCR NEGATIVE NEGATIVE Final   Influenza B by PCR NEGATIVE NEGATIVE Final    Comment: (NOTE) The Xpert Xpress SARS-CoV-2/FLU/RSV assay is intended as an aid in  the diagnosis of influenza from Nasopharyngeal swab specimens and  should not be used as a sole basis for treatment. Nasal washings and  aspirates are unacceptable for Xpert Xpress SARS-CoV-2/FLU/RSV  testing.  Fact Sheet for Patients: PinkCheek.be  Fact Sheet for Healthcare Providers: GravelBags.it  This test is not yet approved or cleared by the [place] FDA and  has been authorized for detection and/or diagnosis of SARS-CoV-2 by  FDA under an Emergency Use Authorization (EUA). This EUA will remain  in effect (meaning this test can be used) for the duration of the  Covid-19 declaration under Section 564(b)(1) of the Act, 21  U.S.C. section 360bbb-3(b)(1), unless the authorization is  terminated or revoked. Performed at [facility], [place] [address], [place], [place] [address]   Urine culture     Status: None   Collection Time: 06/17/20  5:45 PM   Specimen: Urine, Catheterized  Result Value Ref Range Status   Specimen Description URINE, CATHETERIZED  Final   Special Requests NONE  Final   Culture   Final    NO GROWTH Performed at [place] [address], [place], [place] [address]    Report Status 06/18/2020 FINAL  Final    RADIOLOGY STUDIES/RESULTS: No results found.   LOS: 2 days   Oren Binet, MD  [facility]    To contact the attending provider between 7A-7P or the covering provider during after hours 7P-7A, please log into the web site [email or web address] and access using  universal [place] password for that web  site. If you do not have the password, please call the hospital operator.  06/19/2020, 1:34 PM

## 2020-06-20 DIAGNOSIS — M6282 Rhabdomyolysis: Secondary | ICD-10-CM | POA: Diagnosis not present

## 2020-06-20 DIAGNOSIS — I4819 Other persistent atrial fibrillation: Secondary | ICD-10-CM | POA: Diagnosis not present

## 2020-06-20 DIAGNOSIS — I63439 Cerebral infarction due to embolism of unspecified posterior cerebral artery: Secondary | ICD-10-CM | POA: Diagnosis not present

## 2020-06-20 DIAGNOSIS — N179 Acute kidney failure, unspecified: Secondary | ICD-10-CM | POA: Diagnosis not present

## 2020-06-20 LAB — GLUCOSE, CAPILLARY
Glucose-Capillary: 104 mg/dL — ABNORMAL HIGH (ref 70–99)
Glucose-Capillary: 89 mg/dL (ref 70–99)
Glucose-Capillary: 57 mg/dL — ABNORMAL LOW (ref 70–99)
Glucose-Capillary: 62 mg/dL — ABNORMAL LOW (ref 70–99)
Glucose-Capillary: 63 mg/dL — ABNORMAL LOW (ref 70–99)
Glucose-Capillary: 113 mg/dL — ABNORMAL HIGH (ref 70–99)
Glucose-Capillary: 215 mg/dL — ABNORMAL HIGH (ref 70–99)

## 2020-06-20 LAB — COMPREHENSIVE METABOLIC PANEL
ALT: 307 U/L — ABNORMAL HIGH (ref 0–44)
AST: 198 U/L — ABNORMAL HIGH (ref 15–41)
Albumin: 2.5 g/dL — ABNORMAL LOW (ref 3.5–5.0)
Alkaline Phosphatase: 91 U/L (ref 38–126)
Anion gap: 17 — ABNORMAL HIGH (ref 5–15)
BUN: 40 mg/dL — ABNORMAL HIGH (ref 8–23)
CO2: 22 mmol/L (ref 22–32)
Calcium: 7.3 mg/dL — ABNORMAL LOW (ref 8.9–10.3)
Chloride: 100 mmol/L (ref 98–111)
Creatinine, Ser: 1.71 mg/dL — ABNORMAL HIGH (ref 0.44–1.00)
GFR, Estimated: 27 mL/min — ABNORMAL LOW (ref >60)
Glucose, Bld: 104 mg/dL — ABNORMAL HIGH (ref 70–99)
Potassium: 4.4 mmol/L (ref 3.5–5.1)
Sodium: 139 mmol/L (ref 135–145)
Total Bilirubin: 1.5 mg/dL — ABNORMAL HIGH (ref 0.3–1.2)
Total Protein: 5.3 g/dL — ABNORMAL LOW (ref 6.5–8.1)

## 2020-06-20 LAB — CBC
HCT: 38.9 % (ref 36.0–46.0)
Hemoglobin: 12.7 g/dL (ref 12.0–15.0)
MCH: 31.6 pg (ref 26.0–34.0)
MCHC: 32.6 g/dL (ref 30.0–36.0)
MCV: 96.8 fL (ref 80.0–100.0)
Platelets: 153 10*3/uL (ref 150–400)
RBC: 4.02 MIL/uL (ref 3.87–5.11)
RDW: 14 % (ref 11.5–15.5)
WBC: 16.7 10*3/uL — ABNORMAL HIGH (ref 4.0–10.5)
nRBC: 0.1 % (ref 0.0–0.2)

## 2020-06-20 LAB — CK: Total CK: 406 U/L — ABNORMAL HIGH (ref 38–234)

## 2020-06-20 MED ORDER — INSULIN GLARGINE 100 UNIT/ML ~~LOC~~ SOLN
5.0000 [IU] | Freq: Every day | SUBCUTANEOUS | Status: DC
  Administered 2020-06-21: 5 [IU] via SUBCUTANEOUS
  Filled 2020-06-20: qty 0.05

## 2020-06-20 MED ORDER — BISACODYL 10 MG RE SUPP
10.0000 mg | Freq: Once | RECTAL | Status: AC
  Administered 2020-06-20: 10 mg via RECTAL
  Filled 2020-06-20: qty 1

## 2020-06-20 MED ORDER — POLYETHYLENE GLYCOL 3350 17 G PO PACK
17.0000 g | PACK | Freq: Every day | ORAL | Status: DC
  Administered 2020-06-20 – 2020-06-21 (×2): 17 g via ORAL
  Filled 2020-06-20: qty 1

## 2020-06-20 MED ORDER — APIXABAN 2.5 MG PO TABS
2.5000 mg | ORAL_TABLET | Freq: Two times a day (BID) | ORAL | Status: DC
  Administered 2020-06-20 – 2020-06-21 (×2): 2.5 mg via ORAL
  Filled 2020-06-20 (×2): qty 1

## 2020-06-20 NOTE — Progress Notes (Signed)
Hypoglycemic Event  CBG: 62  Treatment: 4 oz juice  Symptoms: Asymptomatic  Follow-up CBG: Time: 1750 CBG Result: 113  Possible Reasons for Event: Pt not eating much.   Arletta Bale

## 2020-06-20 NOTE — Progress Notes (Signed)
ANTICOAGULATION CONSULT NOTE - [place] for Eliquis Indication: atrial fibrillation and h/o CVA    Allergies  Allergen Reactions  . Amoxicillin Other (See Comments)    tongue felt bruised Did it involve swelling of the face/tongue/throat, SOB, or low BP? No Did it involve sudden or severe rash/hives, skin peeling, or any reaction on the inside of your mouth or nose? No Did you need to seek medical attention at a hospital or doctor's office? No When did it last happen?childhood If all above answers are "NO", may proceed with cephalosporin use.    . Celebrex [Celecoxib] Other (See Comments)    Mouth sores    Patient Measurements: Height: 5' (152.4 cm) Weight: 58.1 kg (128 lb 1.4 oz) IBW/kg (Calculated) : 45.5  Vital Signs: Temp: 97.4 F (36.3 C) (11/17 0746) Temp Source: Oral (11/17 0746) BP: 117/59 (11/17 0746) Pulse Rate: 60 (11/17 0414)  Labs: Recent Labs    06/18/20 0156 06/18/20 0156 06/19/20 0148 06/20/20 0133  HGB 12.6   < > 12.6 12.7  HCT 38.5  --  37.4 38.9  PLT 176  --  164 153  CREATININE 2.34*  --  1.96* 1.71*  CKTOTAL 1,051*  --  708* 406*   < > = values in this interval not displayed.    Estimated Creatinine Clearance: 15 mL/min (A) (by C-G formula based on SCr of 1.71 mg/dL (H)).  Assessment: CC/HPI: found down for unknown time with rhabdo  PMH: advanced dementia, HTN, atrial fibrillation on anticoagulation, CKD stage III, HFpEF, aortic stenosis s/p TAVR, h/o CVA, h/o errosive gastritis. Hypothyroid,   Anticoag: Apixaban 2.5 PTA for afib and h/o CVA. Resume 11/17.Hgb 12.7 WNL. Plts 153 WNL.  86 y/o, 58kg, Scr 1.71 trending down   Goal of Therapy:  Therapeutic oral anticoagulation  Plan:  Eliquis 2.5mg  BID.    Gayle Collard S. Alford Highland, PharmD, BCPS Clinical Staff Pharmacist [email or web address] Alford Highland, [employer] 06/20/2020,1:44 PM

## 2020-06-20 NOTE — Progress Notes (Signed)
PROGRESS NOTE        PATIENT DETAILS Name: Alexa Mcdonald Age: 86 y.o. Sex: female Date of Birth: 07-16-1921 Admit Date: 06/17/2020 Admitting Physician Vashti Hey, MD [id number], Ivin Booty, MD  Brief Narrative: Patient is a 86 y.o. female advanced dementia, HTN, atrial fibrillation on anticoagulation, CKD stage III, HFpEF, aortic stenosis s/p TAVR found at her independent living facility on the floor-found to have AKI and rhabdomyolysis.  Subsequently admitted to the hospitalist service.  See below for further details.  Significant events: 11/14>> found on the floor in the kitchen at independent living-found to have rhabdomyolysis, AKI.  Significant studies: 11/14>> CT head: No acute intracranial abnormality. 11/14>> CT C-spine: No acute traumatic injury. 11/14>> x-ray pelvis: No fracture seen. 11/14>> chest x-ray: No acute cardiopulmonary disease.  Antimicrobial therapy: Vancomycin: 11/14>> 11/16 Cefepime: 11/14>> 11/16  Microbiology data: 11/14>> blood culture: No growth  Procedures : None  Consults: None  DVT Prophylaxis : SCDs Start: 06/17/20 1509  SQ heparin  Subjective: Remains pleasantly confused.  Per nursing staff-patient had a large BM following a Dulcolax suppository earlier this morning.  Assessment/Plan: Rhabdomyolysis: Appears to be mild-secondary to being down on the floor in the kitchen for an unknown period of time.  No longer on IV fluids-CK slowly downtrending.  Transaminitis: Likely secondary to above-LFTs still trending up-continue supportive care-avoid hepatotoxic agents-reassess tomorrow.  AKI on CKD stage IIIa: AKI likely hemodynamically mediated, rhabdomyolysis and possibly osmotic diuresis due to hyperglycemia.  Significantly improved with supportive care-creatinine not far from usual baseline.  DM-2 with uncontrolled hyperglycemia (A1c 7.3 on 11/14): Does not appear to be on any oral hypoglycemic  agents in the outpatient setting-CBGs relatively stable with Lantus 8 units and SSI.  Follow and adjust.   Recent Labs    06/19/20 2116 06/20/20 0617 06/20/20 1150  GLUCAP 123* 104* 89   Chronic atrial fibrillation: Rate controlled without any rate control agents-Eliquis was held on admission-on 11/17-this MD had a long discussion with patient's son over the phone regarding risks/benefits of continued anticoagulation-since patient is going to a skilled nursing facility on discharge-patient's son feels that patient will have significant supervision to prevent catastrophic falls-and wishes to continue anticoagulation.  He understands that patient could have spontaneous GI bleeding etc  History of aortic stenosis-s/p TAVR  History of chronic diastolic heart failure: Volume status stable-hold diuretics.  History of CVA: Moving all 4 extremities-see above regarding discussion about resuming Eliquis.  History of erosive gastritis: Continue PPI  Leukocytosis: Unclear etiology-blood cultures/UA/chest x-ray negative for infection-no obvious etiology seen on exam.  Could be related to rhabdomyolysis/soft tissue compression from a fall.  WBC still elevated but slowly downtrending.  Off all antimicrobial therapy.  Hypothyroidism: Continue methimazole  Dementia: Pleasantly confused-maintain delirium precautions  Fall: Not sure if she syncopized or this was a mechanical fall-continue telemetry monitoring-appreciate PT/OT eval-plans are for SNF on discharge.  Mild lactic acidosis: Unclear etiology-clinical features features not consistent with sepsis.  Has down trended with supportive care.  Constipation/fecal impaction: Given MiraLAX/senna/lactulose-and finally Dulcolax suppository this morning with a large BM.  Will maintain on bowel regimen with MiraLAX.  Fall/debility/deconditioning: Very elderly/frail-suspect significant amount of debility at baseline-lives at independent living-probably will  require SNF on discharge.  Appreciate rehab services eval-we will consult social work.   Diet: Diet Order  Diet Carb Modified Fluid consistency: Thin; Room service appropriate? No  Diet effective now                  Code Status:  DNR  Family Communication: Son-[name] [phone])-over the phone on 11/17.  Disposition Plan: Status is: Inpatient  Remains inpatient appropriate because:Inpatient level of care appropriate due to severity of illness   Dispo: The patient is from: Home              Anticipated d/c is to: SNF              Anticipated d/c date is: 2 days              Patient currently is not medically stable to d/c.   Barriers to Discharge: Rhabdomyolysis-worsening AKi-likely will need SNF on discharge  Antimicrobial agents: Anti-infectives (From admission, onward)   Start     Dose/Rate Route Frequency Ordered Stop   06/19/20 1800  vancomycin (VANCOREADY) IVPB 750 mg/150 mL  Status:  Discontinued        750 mg 150 mL/hr over 60 Minutes Intravenous Every 48 hours 06/17/20 1751 06/19/20 0805   06/17/20 1800  ceFEPIme (MAXIPIME) 2 g in sodium chloride 0.9 % 100 mL IVPB  Status:  Discontinued        2 g 200 mL/hr over 30 Minutes Intravenous Every 24 hours 06/17/20 1700 06/19/20 1348   06/17/20 1800  vancomycin (VANCOCIN) IVPB 1000 mg/200 mL premix        1,000 mg 200 mL/hr over 60 Minutes Intravenous  Once 06/17/20 1700 06/17/20 2030       Time spent: 25- minutes-Greater than 50% of this time was spent in counseling, explanation of diagnosis, planning of further management, and coordination of care.  MEDICATIONS: Scheduled Meds: . insulin aspart  0-9 Units Subcutaneous TID WC  . insulin glargine  8 Units Subcutaneous Daily  . methimazole  5 mg Oral Q breakfast  . pantoprazole  40 mg Oral Daily  . polyethylene glycol  17 g Oral Daily  . senna-docusate  2 tablet Oral QHS  . vitamin B-12  1,000 mcg Oral Q breakfast   Continuous Infusions: .  sodium chloride 10 mL/hr at 06/19/20 1110   PRN Meds:.acetaminophen **OR** acetaminophen   PHYSICAL EXAM: Vital signs: Vitals:   06/19/20 1943 06/19/20 2358 06/20/20 0414 06/20/20 0746  BP: 116/62 (!) 117/96 (!) 111/55 (!) 117/59  Pulse: 60 60 60   Resp: 19 20 20 19   Temp: 99.1 F (37.3 C) 98.3 F (36.8 C) 98.3 F (36.8 C) (!) 97.4 F (36.3 C)  TempSrc: Oral Oral Oral Oral  SpO2: 100% 98% 95% 99%  Weight:      Height:       Filed Weights   06/17/20 1630 06/17/20 2054  Weight: 59.9 kg 58.1 kg   Body mass index is 25.02 kg/m.   Gen Exam: Pleasantly confused-not in any distress HEENT:atraumatic, normocephalic Chest: B/L clear to auscultation anteriorly CVS:S1S2 regular Abdomen:soft non tender, non distended Extremities:no edema Neurology: Non focal Skin: no rash I have personally reviewed following labs and imaging studies  LABORATORY DATA: CBC: Recent Labs  Lab 06/17/20 1054 06/17/20 1312 06/18/20 0156 06/19/20 0148 06/20/20 0133  WBC 21.5*  --  19.5* 16.5* 16.7*  NEUTROABS 19.4*  --   --   --   --   HGB 12.0 12.2 12.6 12.6 12.7  HCT 37.4 36.0 38.5 37.4 38.9  MCV 99.5  --  96.5  94.4 96.8  PLT 235  --  176 164 563    Basic Metabolic Panel: Recent Labs  Lab 06/17/20 1054 06/17/20 1054 06/17/20 1312 06/17/20 1738 06/18/20 0156 06/19/20 0148 06/20/20 0133  NA 137   < > 139 138 139 142 139  K 4.4   < > 3.9 4.3 4.2 3.4* 4.4  CL 100  --   --  103 102 100 100  CO2 19*  --   --  20* 24 29 22   GLUCOSE 449*  --   --  384* 302* 134* 104*  BUN 33*  --   --  34* 37* 37* 40*  CREATININE 2.17*  --   --  2.28* 2.34* 1.96* 1.71*  CALCIUM 8.7*  --   --  8.6* 8.1* 7.4* 7.3*   < > = values in this interval not displayed.    GFR: Estimated Creatinine Clearance: 15 mL/min (A) (by C-G formula based on SCr of 1.71 mg/dL (H)).  Liver Function Tests: Recent Labs  Lab 06/17/20 1054 06/19/20 0148 06/20/20 0133  AST 51* 181* 198*  ALT 20 244* 307*  ALKPHOS 94  80 91  BILITOT 1.5* 1.1 1.5*  PROT 5.8* 5.4* 5.3*  ALBUMIN 3.2* 2.7* 2.5*   No results for input(s): LIPASE, AMYLASE in the last 168 hours. No results for input(s): AMMONIA in the last 168 hours.  Coagulation Profile: No results for input(s): INR, PROTIME in the last 168 hours.  Cardiac Enzymes: Recent Labs  Lab 06/17/20 1054 06/17/20 1738 06/18/20 0156 06/19/20 0148 06/20/20 0133  CKTOTAL 1,219* 1,220* 1,051* 708* 406*    BNP (last 3 results) No results for input(s): PROBNP in the last 8760 hours.  Lipid Profile: No results for input(s): CHOL, HDL, LDLCALC, TRIG, CHOLHDL, LDLDIRECT in the last 72 hours.  Thyroid Function Tests: No results for input(s): TSH, T4TOTAL, FREET4, T3FREE, THYROIDAB in the last 72 hours.  Anemia Panel: No results for input(s): VITAMINB12, FOLATE, FERRITIN, TIBC, IRON, RETICCTPCT in the last 72 hours.  Urine analysis:    Component Value Date/Time   COLORURINE AMBER (A) 06/17/2020 1738   APPEARANCEUR HAZY (A) 06/17/2020 1738   LABSPEC 1.019 06/17/2020 1738   PHURINE 5.0 06/17/2020 1738   GLUCOSEU 150 (A) 06/17/2020 1738   HGBUR NEGATIVE 06/17/2020 1738   BILIRUBINUR NEGATIVE 06/17/2020 1738   KETONESUR 5 (A) 06/17/2020 1738   PROTEINUR 30 (A) 06/17/2020 1738   NITRITE NEGATIVE 06/17/2020 1738   LEUKOCYTESUR NEGATIVE 06/17/2020 1738    Sepsis Labs: Lactic Acid, Venous    Component Value Date/Time   LATICACIDVEN 4.4 ([place]) 06/18/2020 0156    MICROBIOLOGY: Recent Results (from the past 240 hour(s))  Urine culture     Status: Abnormal   Collection Time: 06/17/20 11:46 AM   Specimen: Urine, Random  Result Value Ref Range Status   Specimen Description URINE, RANDOM  Final   Special Requests   Final    NONE Performed at [facility], [place] [address], [place], [place] [address]    Culture MULTIPLE SPECIES PRESENT, SUGGEST RECOLLECTION (A)  Final   Report Status 06/18/2020 FINAL  Final  Culture, blood (routine x 2)     Status:  None (Preliminary result)   Collection Time: 06/17/20 12:09 PM   Specimen: BLOOD  Result Value Ref Range Status   Specimen Description BLOOD RIGHT ANTECUBITAL  Final   Special Requests   Final    BOTTLES DRAWN AEROBIC AND ANAEROBIC Blood Culture adequate volume   Culture  Final    NO GROWTH 3 DAYS Performed at [facility], [place] [address] [place], [place] [address]    Report Status PENDING  Incomplete  Culture, blood (routine x 2)     Status: None (Preliminary result)   Collection Time: 06/17/20 12:19 PM   Specimen: BLOOD LEFT FOREARM  Result Value Ref Range Status   Specimen Description BLOOD LEFT FOREARM  Final   Special Requests   Final    BOTTLES DRAWN AEROBIC ONLY Blood Culture results may not be optimal due to an inadequate volume of blood received in culture bottles   Culture   Final    NO GROWTH 3 DAYS Performed at [facility], [place] [address] [place], [place] [address]    Report Status PENDING  Incomplete  Respiratory Panel by RT PCR (Flu A&B, Covid) - Nasopharyngeal Swab     Status: None   Collection Time: 06/17/20 12:19 PM   Specimen: Nasopharyngeal Swab  Result Value Ref Range Status   SARS Coronavirus 2 by RT PCR NEGATIVE NEGATIVE Final    Comment: (NOTE) SARS-CoV-2 target nucleic acids are NOT DETECTED.  The SARS-CoV-2 RNA is generally detectable in upper respiratoy specimens during the acute phase of infection. The lowest concentration of SARS-CoV-2 viral copies this assay can detect is 131 copies/mL. A negative result does not preclude SARS-Cov-2 infection and should not be used as the sole basis for treatment or other patient management decisions. A negative result may occur with  improper specimen collection/handling, submission of specimen other than nasopharyngeal swab, presence of viral mutation(s) within the areas targeted by this assay, and inadequate number of viral copies (<131 copies/mL). A negative result must be combined with  clinical observations, patient history, and epidemiological information. The expected result is Negative.  Fact Sheet for Patients:  PinkCheek.be  Fact Sheet for Healthcare Providers:  GravelBags.it  This test is no t yet approved or cleared by the [place] FDA and  has been authorized for detection and/or diagnosis of SARS-CoV-2 by FDA under an Emergency Use Authorization (EUA). This EUA will remain  in effect (meaning this test can be used) for the duration of the COVID-19 declaration under Section 564(b)(1) of the Act, 21 U.S.C. section 360bbb-3(b)(1), unless the authorization is terminated or revoked sooner.     Influenza A by PCR NEGATIVE NEGATIVE Final   Influenza B by PCR NEGATIVE NEGATIVE Final    Comment: (NOTE) The Xpert Xpress SARS-CoV-2/FLU/RSV assay is intended as an aid in  the diagnosis of influenza from Nasopharyngeal swab specimens and  should not be used as a sole basis for treatment. Nasal washings and  aspirates are unacceptable for Xpert Xpress SARS-CoV-2/FLU/RSV  testing.  Fact Sheet for Patients: PinkCheek.be  Fact Sheet for Healthcare Providers: GravelBags.it  This test is not yet approved or cleared by the [place] FDA and  has been authorized for detection and/or diagnosis of SARS-CoV-2 by  FDA under an Emergency Use Authorization (EUA). This EUA will remain  in effect (meaning this test can be used) for the duration of the  Covid-19 declaration under Section 564(b)(1) of the Act, 21  U.S.C. section 360bbb-3(b)(1), unless the authorization is  terminated or revoked. Performed at [facility], [place] [address] [place], [place] [address]   Urine culture     Status: None   Collection Time: 06/17/20  5:45 PM   Specimen: Urine, Catheterized  Result Value Ref Range Status   Specimen Description URINE, CATHETERIZED  Final     Special Requests NONE  Final   Culture   Final    NO GROWTH Performed at [facility], [place] [address] [place], [place] [address]    Report Status 06/18/2020 FINAL  Final    RADIOLOGY STUDIES/RESULTS: No results found.   LOS: 3 days   Oren Binet, MD  [facility]    To contact the attending provider between 7A-7P or the covering provider during after hours 7P-7A, please log into the web site [email or web address] and access using universal [place] password for that web site. If you do not have the password, please call the hospital operator.  06/20/2020, 1:19 PM

## 2020-06-20 NOTE — Progress Notes (Signed)
Physical Therapy Treatment Patient Details Name: Alexa Mcdonald MRN: [id number] DOB: August 07, 1920 Today's Date: 06/20/2020    History of Present Illness Pt is a 86 year old female who presented to the hospital with AMS after being found on the floor in the kitchen by staff at her independent living apartments at [facility], per chart. It is unclear how and when she fell. The patient is disoriented to dates and times at basline due to her dementia, per chart. CT negative for acute changes in head or cervical spine and radiographs negative for acute fractures. PMH significant for: tachycardia-bradycardia, stroke, aoritc stenosis, s/p TAVR, paroxysmal a-fib, osteoporosis, mitral regurgitation, CAD, LBBB, interstitial lung disease, hx of orthostatic hypotension, HOH, HTN, DM, CKD, chronic diastolic heart failure, dementia, and 1st degree AV block.    PT Comments    Pt supine in bed on arrival.  She is more alert after waking and participated with transfer from bed to recliner.  Pt remains to require re-orientation due to baseline dementia.  Participated in seated LE exercises this session. Plan remains for SNF.     Follow Up Recommendations  SNF;Supervision/Assistance - 24 hour     Equipment Recommendations  Wheelchair (measurements PT);Wheelchair cushion (measurements PT);3in1 (PT);Hospital bed    Recommendations for Other Services       Precautions / Restrictions Precautions Precautions: Fall Precaution Comments: orthostatic BP, dementia Restrictions Weight Bearing Restrictions: No    Mobility  Bed Mobility Overal bed mobility: Needs Assistance Bed Mobility: Supine to Sit     Supine to sit: Mod assist     General bed mobility comments: Mod assistance to move LEs to edge of bed and elevate trunk into a seated position.  Pt presents with posterior bias and delayed righting response.  Transfers Overall transfer level: Needs assistance Equipment used: None Transfers: Sit  to/from [facility] Sit to Stand: Min assist;Mod assist Stand pivot transfers: Mod assist       General transfer comment: Face to Face assistance to power up into standing and return to seated position.  Pt slow and guarded with poor balance and decreased activity tolerance.  Ambulation/Gait Ambulation/Gait assistance:  (NT.)               Stairs             Wheelchair Mobility    Modified Rankin (Stroke Patients Only)       Balance Overall balance assessment: Needs assistance Sitting-balance support: No upper extremity supported;Feet supported Sitting balance-Leahy Scale: Fair Sitting balance - Comments: Slight right lean; fatigue. Able to correct with tactile cues Postural control: Right lateral lean   Standing balance-Leahy Scale: Poor Standing balance comment: Reliant on physical A                            Cognition Arousal/Alertness: Awake/alert Behavior During Therapy: WFL for tasks assessed/performed Overall Cognitive Status: Impaired/Different from baseline                                 General Comments: Pt with baseline dementia. Unsure of baseline cognitive level.      Exercises General Exercises - Lower Extremity Long Arc Quad: AROM;Both;10 reps;Seated Hip Flexion/Marching: AROM;Both;10 reps;Seated    General Comments        Pertinent Vitals/Pain Pain Assessment: Faces Faces Pain Scale: Hurts a little bit Pain Location: generalized with mobility Pain Descriptors /  Indicators: Grimacing Pain Intervention(s): Monitored during session;Repositioned    Home Living                      Prior Function            PT Goals (current goals can now be found in the care plan section) Acute Rehab PT Goals Patient Stated Goal: None stated Potential to Achieve Goals: Fair Progress towards PT goals: Progressing toward goals    Frequency    Min 3X/week      PT Plan Current plan  remains appropriate    Co-evaluation              AM-PAC PT "6 Clicks" Mobility   Outcome Measure  Help needed turning from your back to your side while in a flat bed without using bedrails?: Total Help needed moving from lying on your back to sitting on the side of a flat bed without using bedrails?: Total Help needed moving to and from a bed to a chair (including a wheelchair)?: Total Help needed standing up from a chair using your arms (e.g., wheelchair or bedside chair)?: Total Help needed to walk in hospital room?: Total Help needed climbing 3-5 steps with a railing? : Total 6 Click Score: 6    End of Session Equipment Utilized During Treatment: Gait belt Activity Tolerance: Patient limited by lethargy;Patient limited by fatigue;Treatment limited secondary to medical complications (Comment) Patient left: in bed;with call bell/phone within reach;with bed alarm set Nurse Communication: Mobility status;Precautions PT Visit Diagnosis: Repeated falls (R29.6);History of falling (Z91.81);Muscle weakness (generalized) (M62.81);Difficulty in walking, not elsewhere classified (R26.2)     Time: 4008-6761 PT Time Calculation (min) (ACUTE ONLY): 16 min  Charges:  $Therapeutic Activity: 8-22 mins                     Erasmo Leventhal , PTA Acute Rehabilitation Services Pager [phone] Office [phone]     Sarah Baez Eli Hose 06/20/2020, 4:58 PM

## 2020-06-21 DIAGNOSIS — R531 Weakness: Secondary | ICD-10-CM | POA: Diagnosis not present

## 2020-06-21 DIAGNOSIS — I480 Paroxysmal atrial fibrillation: Secondary | ICD-10-CM | POA: Diagnosis not present

## 2020-06-21 DIAGNOSIS — E1169 Type 2 diabetes mellitus with other specified complication: Secondary | ICD-10-CM | POA: Diagnosis not present

## 2020-06-21 DIAGNOSIS — I5033 Acute on chronic diastolic (congestive) heart failure: Secondary | ICD-10-CM | POA: Diagnosis not present

## 2020-06-21 DIAGNOSIS — L97819 Non-pressure chronic ulcer of other part of right lower leg with unspecified severity: Secondary | ICD-10-CM | POA: Diagnosis not present

## 2020-06-21 DIAGNOSIS — M2550 Pain in unspecified joint: Secondary | ICD-10-CM | POA: Diagnosis not present

## 2020-06-21 DIAGNOSIS — E119 Type 2 diabetes mellitus without complications: Secondary | ICD-10-CM | POA: Diagnosis not present

## 2020-06-21 DIAGNOSIS — N1831 Chronic kidney disease, stage 3a: Secondary | ICD-10-CM | POA: Diagnosis not present

## 2020-06-21 DIAGNOSIS — R4182 Altered mental status, unspecified: Secondary | ICD-10-CM | POA: Diagnosis not present

## 2020-06-21 DIAGNOSIS — M6282 Rhabdomyolysis: Secondary | ICD-10-CM | POA: Diagnosis not present

## 2020-06-21 DIAGNOSIS — R52 Pain, unspecified: Secondary | ICD-10-CM | POA: Diagnosis not present

## 2020-06-21 DIAGNOSIS — I83028 Varicose veins of left lower extremity with ulcer other part of lower leg: Secondary | ICD-10-CM | POA: Diagnosis not present

## 2020-06-21 DIAGNOSIS — N179 Acute kidney failure, unspecified: Secondary | ICD-10-CM | POA: Diagnosis not present

## 2020-06-21 DIAGNOSIS — R269 Unspecified abnormalities of gait and mobility: Secondary | ICD-10-CM | POA: Diagnosis not present

## 2020-06-21 DIAGNOSIS — I10 Essential (primary) hypertension: Secondary | ICD-10-CM | POA: Diagnosis not present

## 2020-06-21 DIAGNOSIS — D72829 Elevated white blood cell count, unspecified: Secondary | ICD-10-CM | POA: Diagnosis not present

## 2020-06-21 DIAGNOSIS — Z952 Presence of prosthetic heart valve: Secondary | ICD-10-CM | POA: Diagnosis not present

## 2020-06-21 DIAGNOSIS — F0390 Unspecified dementia without behavioral disturbance: Secondary | ICD-10-CM | POA: Diagnosis not present

## 2020-06-21 DIAGNOSIS — I4819 Other persistent atrial fibrillation: Secondary | ICD-10-CM | POA: Diagnosis not present

## 2020-06-21 DIAGNOSIS — I83018 Varicose veins of right lower extremity with ulcer other part of lower leg: Secondary | ICD-10-CM | POA: Diagnosis not present

## 2020-06-21 DIAGNOSIS — I959 Hypotension, unspecified: Secondary | ICD-10-CM | POA: Diagnosis not present

## 2020-06-21 DIAGNOSIS — W19XXXD Unspecified fall, subsequent encounter: Secondary | ICD-10-CM | POA: Diagnosis not present

## 2020-06-21 DIAGNOSIS — I63439 Cerebral infarction due to embolism of unspecified posterior cerebral artery: Secondary | ICD-10-CM | POA: Diagnosis not present

## 2020-06-21 DIAGNOSIS — E0590 Thyrotoxicosis, unspecified without thyrotoxic crisis or storm: Secondary | ICD-10-CM | POA: Diagnosis not present

## 2020-06-21 DIAGNOSIS — I83022 Varicose veins of left lower extremity with ulcer of calf: Secondary | ICD-10-CM | POA: Diagnosis not present

## 2020-06-21 DIAGNOSIS — Z20822 Contact with and (suspected) exposure to covid-19: Secondary | ICD-10-CM | POA: Diagnosis not present

## 2020-06-21 DIAGNOSIS — Z7401 Bed confinement status: Secondary | ICD-10-CM | POA: Diagnosis not present

## 2020-06-21 DIAGNOSIS — E1140 Type 2 diabetes mellitus with diabetic neuropathy, unspecified: Secondary | ICD-10-CM | POA: Diagnosis not present

## 2020-06-21 DIAGNOSIS — K2960 Other gastritis without bleeding: Secondary | ICD-10-CM | POA: Diagnosis not present

## 2020-06-21 DIAGNOSIS — D509 Iron deficiency anemia, unspecified: Secondary | ICD-10-CM | POA: Diagnosis not present

## 2020-06-21 LAB — COMPREHENSIVE METABOLIC PANEL
ALT: 272 U/L — ABNORMAL HIGH (ref 0–44)
AST: 120 U/L — ABNORMAL HIGH (ref 15–41)
Albumin: 2.6 g/dL — ABNORMAL LOW (ref 3.5–5.0)
Alkaline Phosphatase: 88 U/L (ref 38–126)
Anion gap: 13 (ref 5–15)
BUN: 47 mg/dL — ABNORMAL HIGH (ref 8–23)
CO2: 25 mmol/L (ref 22–32)
Calcium: 7.7 mg/dL — ABNORMAL LOW (ref 8.9–10.3)
Chloride: 100 mmol/L (ref 98–111)
Creatinine, Ser: 1.83 mg/dL — ABNORMAL HIGH (ref 0.44–1.00)
GFR, Estimated: 25 mL/min — ABNORMAL LOW (ref >60)
Glucose, Bld: 214 mg/dL — ABNORMAL HIGH (ref 70–99)
Potassium: 4.1 mmol/L (ref 3.5–5.1)
Sodium: 138 mmol/L (ref 135–145)
Total Bilirubin: 1.3 mg/dL — ABNORMAL HIGH (ref 0.3–1.2)
Total Protein: 5.4 g/dL — ABNORMAL LOW (ref 6.5–8.1)

## 2020-06-21 LAB — GLUCOSE, CAPILLARY
Glucose-Capillary: 145 mg/dL — ABNORMAL HIGH (ref 70–99)
Glucose-Capillary: 133 mg/dL — ABNORMAL HIGH (ref 70–99)

## 2020-06-21 LAB — CBC
HCT: 38.5 % (ref 36.0–46.0)
Hemoglobin: 12.7 g/dL (ref 12.0–15.0)
MCH: 32 pg (ref 26.0–34.0)
MCHC: 33 g/dL (ref 30.0–36.0)
MCV: 97 fL (ref 80.0–100.0)
Platelets: 170 10*3/uL (ref 150–400)
RBC: 3.97 MIL/uL (ref 3.87–5.11)
RDW: 14 % (ref 11.5–15.5)
WBC: 14.6 10*3/uL — ABNORMAL HIGH (ref 4.0–10.5)
nRBC: 0.2 % (ref 0.0–0.2)

## 2020-06-21 LAB — SARS CORONAVIRUS 2 BY RT PCR (HOSPITAL ORDER, PERFORMED IN ~~LOC~~ HOSPITAL LAB): SARS Coronavirus 2: NEGATIVE

## 2020-06-21 MED ORDER — INSULIN ASPART 100 UNIT/ML ~~LOC~~ SOLN
SUBCUTANEOUS | 11 refills | Status: DC
Start: 2020-06-21 — End: 2020-12-05

## 2020-06-21 MED ORDER — POLYETHYLENE GLYCOL 3350 17 G PO PACK
17.0000 g | PACK | Freq: Every day | ORAL | 0 refills | Status: DC
Start: 2020-06-21 — End: 2020-12-05

## 2020-06-21 NOTE — Consult Note (Signed)
   [facility] Inpatient Consult   06/21/2020  Alexa Mcdonald 13-Nov-1920 [id number]   [place] [facility] [ACO] Patient:  Medicare NextGen   Patient transition of care follow up needs for  potential [facility] service benefits.  Review of patient's medical record reveals patient is from an [place] and currently recommended for a skilled nursing level of care for rehab needs. Plan:  Will have [facility] RN PAC to follow for transition of care needs from SNF rehab.  For questions contact:   [name], RN BSN [facility] Liaison  [phone] business mobile phone Toll free office [phone]  Fax number: [phone] [email or web address]

## 2020-06-21 NOTE — Plan of Care (Signed)

## 2020-06-21 NOTE — Progress Notes (Signed)
Pt. Discharged to [facility]. Report called to RN at facility. PTAR to transport pt. IVS removed, personal care completed prior to d/c. Pt left accompanied by PTAR in no acute distress.  Fuller Canada, RN

## 2020-06-21 NOTE — TOC Progression Note (Signed)
Transition of Care [facility]) - Progression Note    Patient Details  Name: Alexa Mcdonald MRN: [id number] Date of Birth: October 08, 1920  Transition of Care [facility]) CM/SW [place], [place] Phone Number: 06/21/2020, 11:06 AM  Clinical Narrative:     Patient will discharge to [facility] pending covid test results and family completes admission paperwork at [facility].  CSW will continue to follow and assist with discharge planning.  Thurmond Butts, MSW, [place] Clinical Social Worker   Expected Discharge Plan: Skilled Nursing Facility Barriers to Discharge: Continued Medical Work up, SNF Pending bed offer  Expected Discharge Plan and Services Expected Discharge Plan: [place] In-house Referral: Clinical Social Work       Expected Discharge Date: 06/21/20                                     Social Determinants of Health (SDOH) Interventions    Readmission Risk Interventions No flowsheet data found.

## 2020-06-21 NOTE — Discharge Summary (Signed)
PATIENT DETAILS Name: Alexa Mcdonald Age: 86 y.o. Sex: female Date of Birth: 10/01/1920 MRN: [id number]. Admitting Physician: Vashti Hey, MD [id number], Ivin Booty, MD  Admit Date: 06/17/2020 Discharge date: 06/21/2020  Recommendations for Outpatient Follow-up:  1. Follow up with PCP in 1-2 weeks 2. Please obtain CMP/CBC in one week 3. Please ensure follow-up with cardiology. 4. Please continue ongoing discussion regarding risk/benefits of anticoagulation-see below.  Admitted From:  [facility]  Disposition: SNF    Home Health: No  Equipment/Devices: None  Discharge Condition: Stable  CODE STATUS: DNR  Diet recommendation:  Diet Order            Diet - low sodium heart healthy           Diet Carb Modified           Diet Carb Modified Fluid consistency: Thin; Room service appropriate? No  Diet effective now                 Brief Narrative: Patient is a 86 y.o. female advanced dementia, HTN, atrial fibrillation on anticoagulation, CKD stage III, HFpEF, aortic stenosis s/p TAVR found at her independent living facility on the floor-found to have AKI and rhabdomyolysis.  Subsequently admitted to the hospitalist service.  See below for further details.  Significant events: 11/14>> found on the floor in the kitchen at independent living-found to have rhabdomyolysis, AKI.  Significant studies: 11/14>> CT head: No acute intracranial abnormality. 11/14>> CT C-spine: No acute traumatic injury. 11/14>> x-ray pelvis: No fracture seen. 11/14>> chest x-ray: No acute cardiopulmonary disease.  Antimicrobial therapy: Vancomycin: 11/14>> 11/16 Cefepime: 11/14>> 11/16  Microbiology data: 11/14>> blood culture: No growth  Procedures : None  Consults: None   Brief Hospital Course: Rhabdomyolysis: Appears to be mild-secondary to being down on the floor in the kitchen for an unknown period of time.  No longer on IV fluids-CK has significantly down  trended.  Transaminitis: Likely secondary to above-LFTs are slowly downtrending-repeat in 1 week.  AKI on CKD stage IIIa: AKI likely hemodynamically mediated, rhabdomyolysis and possibly osmotic diuresis due to hyperglycemia.  Significantly improved with supportive care-creatinine not far from usual baseline.  DM-2 with uncontrolled hyperglycemia (A1c 7.3 on 11/14): Does not appear to be on any oral hypoglycemic agents in the outpatient setting-CBGs relatively stable-we will continue with SSI on discharge.  Given advanced age-multiple medical comorbidities-reasonable to allow some amount of permissive hyperglycemia to prevent life-threatening hypoglycemic episodes.  Chronic atrial fibrillation: Rate controlled without any rate control agents-Eliquis was held on admission-on 11/17-this MD had a long discussion with patient's son over the phone regarding risks/benefits of continued anticoagulation-since patient is going to a skilled nursing facility on discharge-patient's son feels that patient will have significant supervision to prevent catastrophic falls-and wishes to continue anticoagulation.  He understands that patient could have spontaneous GI bleeding etc.  Attending MD at [facility] outpatient physicians including primary cardiologist to continue to assess risk/benefits with family.  History of aortic stenosis-s/p TAVR  History of chronic diastolic heart failure: Volume status stable-hold diuretics.  History of CVA: Moving all 4 extremities-see above regarding discussion about resuming Eliquis.  History of erosive gastritis: Continue PPI  Leukocytosis: Unclear etiology-blood cultures/UA/chest x-ray negative for infection-no obvious etiology seen on exam.  Could be related to rhabdomyolysis/soft tissue compression from a fall.  WBC still elevated but slowly downtrending.  Off all antimicrobial therapy.  Please repeat CBC in 1 week.  Hyperthyroidism: Continue methimazole  Dementia:  Pleasantly confused-maintain delirium precautions  Fall: Not sure if she syncopized or this was a mechanical fall-continue telemetry monitoring-appreciate PT/OT eval-plans are for SNF on discharge.  Mild lactic acidosis: Unclear etiology-clinical features features not consistent with sepsis.  Has down trended with supportive care.  Constipation/fecal impaction: Given MiraLAX/senna/lactulose-and finally Dulcolax suppository on 11/17 with a large BM.  Will maintain on bowel regimen with MiraLAX.  Fall/debility/deconditioning: Very elderly/frail-suspect significant amount of debility at baseline-lives at independent living-probably will require SNF on discharge.     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 [name] in 1-2 weeks  Please get a complete blood count and chemistry panel checked by your Primary MD at your next visit, and again as instructed by your Primary MD.  Get Medicines reviewed and adjusted: Please take all your medications with you for your next visit with your Primary MD  Laboratory/radiological data: Please request your Primary MD to  go over all hospital tests and procedure/radiological results at the follow up, please ask your Primary MD to get all Hospital records sent to his/her office.  In some cases, they will be blood work, cultures and biopsy results pending at the time of your discharge. Please request that your primary care M.D. follows up on these results.  Also Note the following: If you experience worsening of your admission symptoms, develop shortness of breath, life threatening emergency, suicidal or homicidal thoughts you must seek medical attention immediately by calling 911 or calling your MD immediately  if symptoms less severe.  You must read complete instructions/literature along with all the possible adverse reactions/side effects for all the Medicines you take and that have been prescribed to you. Take any new Medicines after you have completely understood and accpet all the possible adverse reactions/side effects.   Do not drive when taking Pain medications or sleeping medications (Benzodaizepines)  Do not take more than prescribed Pain, Sleep and Anxiety Medications. It is not advisable to combine anxiety,sleep and pain medications without talking with your primary care practitioner  Special Instructions: If you have smoked or chewed Tobacco  in the last 2 yrs please stop smoking, stop any regular Alcohol  and or any Recreational drug use.  Wear Seat belts while driving.  Please note: You were cared for by a hospitalist during your hospital stay. Once you are discharged, your primary care physician will handle any further medical issues. Please note that NO REFILLS for any discharge medications will be authorized once you are discharged, as it is imperative that you return to your primary care physician (or establish a relationship with a primary care physician if you do not have one) for your post hospital discharge needs  so that they can reassess your need for medications and monitor your lab values.     Check CBGs before meals and at bedtime   Increase activity slowly   Complete by: As directed      Allergies as of 06/21/2020      Reactions   Amoxicillin Other (See Comments)   tongue felt bruised Did it involve swelling of the face/tongue/throat, SOB, or low BP? No Did it involve sudden or severe rash/hives, skin peeling, or any reaction on the inside of your mouth or nose? No Did you need to seek medical attention at a hospital or doctor's office? No When did it last happen?childhood If all above answers are "NO", may proceed with cephalosporin use.   Celebrex [celecoxib] Other (See Comments)   Mouth sores      Medication List    STOP taking these medications   atorvastatin 10 MG tablet Commonly known as: LIPITOR   furosemide 20 MG tablet Commonly known as: LASIX     TAKE these medications   acetaminophen 325 MG tablet Commonly known as: TYLENOL Take 2 tablets (650 mg total) by mouth every 6 (six) hours as needed for mild pain.   CALCIUM-D PO Take 1 tablet by mouth daily with breakfast. Calcium 1200 mg   Eliquis 2.5 MG Tabs tablet Generic drug: apixaban TAKE 1 TABLET(2.5 MG) BY MOUTH TWICE DAILY What changed: See the new instructions.   ferrous sulfate 325 (65 FE) MG tablet Commonly known as: FeroSul TAKE 1 TABLET(325 MG) BY MOUTH EVERY MORNING What changed:   how much to take  how to take this  when to take this  additional instructions   insulin aspart 100 UNIT/ML injection Commonly known as: novoLOG 0-9 Units, Subcutaneous, 3 times daily with meals CBG < 70: Implement Hypoglycemia measures CBG 70 - 120: 0 units CBG 121 - 150: 1 unit CBG 151 - 200: 2 units CBG 201 - 250: 3 units CBG 251 - 300: 5 units CBG 301 - 350: 7 units CBG 351 - 400: 9 units CBG > 400: call MD   latanoprost 0.005 % ophthalmic solution Commonly known as: XALATAN Place 1 drop into the left eye every evening.   methimazole 10 MG tablet Commonly known as:  TAPAZOLE Take 5 mg by mouth daily with breakfast.   OCUVITE PRESERVISION PO Take 1 capsule by mouth daily with breakfast.   pantoprazole 40 MG tablet Commonly known as: PROTONIX TAKE 1 TABLET(40 MG) BY MOUTH DAILY AT 6 AM What changed: See the new instructions.   polyethylene glycol 17 g packet Commonly known as: MIRALAX / GLYCOLAX Take 17 g by mouth daily.   vitamin B-12 1000 MCG tablet Commonly known as: CYANOCOBALAMIN Take 1,000 mcg by mouth daily with breakfast.   Vitamin D 50 MCG (2000 UT) tablet Take 2,000 Units by mouth daily with breakfast.       Follow-up Information    Jonathon Jordan, MD. Schedule an appointment as soon as possible for a visit in 2 week(s).   Specialty: Family Medicine Contact information: [place] [address] [address] [phone]        Sueanne Margarita, MD. Schedule an appointment as soon as possible for a visit in 2 week(s).   Specialty: Cardiology Contact information: [address] [place] [address] [phone]              Allergies  Allergen Reactions  . Amoxicillin Other (See Comments)  tongue felt bruised Did it involve swelling of the face/tongue/throat, SOB, or low BP? No Did it involve sudden or severe rash/hives, skin peeling, or any reaction on the inside of your mouth or nose? No Did you need to seek medical attention at a hospital or doctor's office? No When did it last happen?childhood If all above answers are "NO", may proceed with cephalosporin use.    . Celebrex [Celecoxib] Other (See Comments)    Mouth sores       Other Procedures/Studies: DG Chest 2 View  Result Date: 06/17/2020 CLINICAL DATA:  Status post fall. EXAM: CHEST - 2 VIEW COMPARISON:  November 09, 2018 FINDINGS: Enlarged cardiac silhouette. Calcific atherosclerotic disease and tortuosity of the aorta. Stable postsurgical changes at the thoracic inlet and aortic valve prosthesis noted. There  is no evidence of focal airspace consolidation, pleural effusion or pneumothorax. Mild interstitial scarring as before. Osseous structures are without acute abnormality. Soft tissues are grossly normal. IMPRESSION: 1. No active cardiopulmonary disease. 2. Enlarged cardiac silhouette. 3. Calcific atherosclerotic disease and tortuosity of the aorta. Electronically Signed   By: Fidela Salisbury M.D.   On: 06/17/2020 11:59   DG Pelvis 1-2 Views  Result Date: 06/17/2020 CLINICAL DATA:  Fall EXAM: PELVIS - 1-2 VIEW COMPARISON:  October 22, 2018 FINDINGS: Osteopenia. No definitive fracture on this single radiograph. There is rotation of the RIGHT hip. Evaluation of the pubic ramus is limited secondary to large rectal stool ball. Assimilation joint of LEFT L5-S1. Degenerative changes of the lower lumbar spine. Vascular calcifications. IMPRESSION: 1. No definitive fracture on this single radiograph. Recommend additional dedicated radiographs of the areas of clinical concern for improved evaluation. Electronically Signed   By: Valentino Saxon MD   On: 06/17/2020 12:01   CT Head Wo Contrast  Result Date: 06/17/2020 CLINICAL DATA:  Post head trauma. EXAM: CT HEAD WITHOUT CONTRAST TECHNIQUE: Contiguous axial images were obtained from the base of the skull through the vertex without intravenous contrast. COMPARISON:  CT of the head February 07, 2016 FINDINGS: Brain: No evidence of acute infarction, hemorrhage, hydrocephalus, extra-axial collection or mass lesion/mass effect. Insert full malacia from chronic left occipital infarct. Advanced brain parenchymal volume loss and moderate deep white matter microangiopathy. Vascular: Calcific atherosclerotic disease. Skull: Normal. Negative for fracture or focal lesion. Sinuses/Orbits: No acute finding. Other: None. IMPRESSION: 1. No acute intracranial abnormality. 2. Advanced brain parenchymal atrophy and chronic microvascular disease. 3. Chronic left occipital infarct.  Electronically Signed   By: Fidela Salisbury M.D.   On: 06/17/2020 13:22   CT Cervical Spine Wo Contrast  Result Date: 06/17/2020 CLINICAL DATA:  Status post fall with neck trauma. EXAM: CT CERVICAL SPINE WITHOUT CONTRAST TECHNIQUE: Multidetector CT imaging of the cervical spine was performed without intravenous contrast. Multiplanar CT image reconstructions were also generated. COMPARISON:  None. FINDINGS: Alignment: Mild anterolisthesis of C6 on C7 and C7 on T1, likely degenerative. Skull base and vertebrae: No acute fracture. No primary bone lesion or focal pathologic process. Soft tissues and spinal canal: No prevertebral fluid or swelling. No visible canal hematoma. Disc levels:  Multilevel osteoarthritic changes, moderate. Upper chest: Negative. Other: Motion degraded exam. IMPRESSION: 1. No evidence of acute traumatic injury to the cervical spine. 2. Multilevel osteoarthritic changes of the cervical spine. 3. Mild anterolisthesis of C6 on C7 and C7 on T1, likely degenerative. Electronically Signed   By: Fidela Salisbury M.D.   On: 06/17/2020 13:27     TODAY-DAY OF DISCHARGE:  Subjective:  Alesia Richards remains pleasantly confused-following simple commands.  Objective:   Blood pressure 129/64, pulse (!) 51, temperature (!) 97.4 F (36.3 C), temperature source Oral, resp. rate 20, height 5' (1.524 m), weight 58.1 kg, SpO2 98 %.  Intake/Output Summary (Last 24 hours) at 06/21/2020 0947 Last data filed at 06/20/2020 2030 Gross per 24 hour  Intake 120 ml  Output --  Net 120 ml   Filed Weights   06/17/20 1630 06/17/20 2054  Weight: 59.9 kg 58.1 kg    Exam: Pleasantly confused., No new F.N deficits, Normal affect [place].AT,PERRAL Supple Neck,No JVD, No cervical lymphadenopathy appriciated.  Symmetrical Chest wall movement, Good air movement bilaterally, CTAB RRR,No Gallops,Rubs or new Murmurs, No Parasternal Heave +ve B.Sounds, Abd Soft, Non tender, No organomegaly  appriciated, No rebound -guarding or rigidity. No Cyanosis, Clubbing or edema, No new Rash or bruise   PERTINENT RADIOLOGIC STUDIES: No results found.   PERTINENT LAB RESULTS: CBC: Recent Labs    06/20/20 0133 06/21/20 0116  WBC 16.7* 14.6*  HGB 12.7 12.7  HCT 38.9 38.5  PLT 153 170   CMET CMP     Component Value Date/Time   NA 138 06/21/2020 0116   NA 137 08/16/2019 1152   K 4.1 06/21/2020 0116   CL 100 06/21/2020 0116   CO2 25 06/21/2020 0116   GLUCOSE 214 (H) 06/21/2020 0116   BUN 47 (H) 06/21/2020 0116   BUN 32 08/16/2019 1152   CREATININE 1.83 (H) 06/21/2020 0116   CREATININE 1.14 (H) 12/24/2015 1311   CALCIUM 7.7 (L) 06/21/2020 0116   PROT 5.4 (L) 06/21/2020 0116   PROT 6.1 12/31/2018 1355   ALBUMIN 2.6 (L) 06/21/2020 0116   ALBUMIN 3.8 12/31/2018 1355   AST 120 (H) 06/21/2020 0116   ALT 272 (H) 06/21/2020 0116   ALKPHOS 88 06/21/2020 0116   BILITOT 1.3 (H) 06/21/2020 0116   BILITOT 0.4 12/31/2018 1355   GFRNONAA 25 (L) 06/21/2020 0116   GFRAA 31 (L) 08/16/2019 1152    GFR Estimated Creatinine Clearance: 14 mL/min (A) (by C-G formula based on SCr of 1.83 mg/dL (H)). No results for input(s): LIPASE, AMYLASE in the last 72 hours. Recent Labs    06/19/20 0148 06/20/20 0133  CKTOTAL 708* 406*   Invalid input(s): POCBNP No results for input(s): DDIMER in the last 72 hours. No results for input(s): HGBA1C in the last 72 hours. No results for input(s): CHOL, HDL, LDLCALC, TRIG, CHOLHDL, LDLDIRECT in the last 72 hours. No results for input(s): TSH, T4TOTAL, T3FREE, THYROIDAB in the last 72 hours.  Invalid input(s): FREET3 No results for input(s): VITAMINB12, FOLATE, FERRITIN, TIBC, IRON, RETICCTPCT in the last 72 hours. Coags: No results for input(s): INR in the last 72 hours.  Invalid input(s): PT Microbiology: Recent Results (from the past 240 hour(s))  Urine culture     Status: Abnormal   Collection Time: 06/17/20 11:46 AM   Specimen: Urine,  Random  Result Value Ref Range Status   Specimen Description URINE, RANDOM  Final   Special Requests   Final    NONE Performed at [facility], [address], [place], [place] [address]    Culture MULTIPLE SPECIES PRESENT, SUGGEST RECOLLECTION (A)  Final   Report Status 06/18/2020 FINAL  Final  Culture, blood (routine x 2)     Status: None (Preliminary result)   Collection Time: 06/17/20 12:09 PM   Specimen: BLOOD  Result Value Ref Range Status   Specimen Description BLOOD RIGHT ANTECUBITAL  Final   Special Requests   Final    BOTTLES DRAWN AEROBIC AND ANAEROBIC Blood Culture adequate volume   Culture   Final    NO GROWTH 3 DAYS Performed at [facility], [address] [place], [place] [address]    Report Status PENDING  Incomplete  Culture, blood (routine x 2)     Status: None (Preliminary result)   Collection Time: 06/17/20 12:19 PM   Specimen: BLOOD LEFT FOREARM  Result Value Ref Range Status   Specimen Description BLOOD LEFT FOREARM  Final   Special Requests   Final    BOTTLES DRAWN AEROBIC ONLY Blood Culture results may not be optimal due to an inadequate volume of blood received in culture bottles   Culture   Final    NO GROWTH 3 DAYS Performed at [facility], [place] [address], [place], [place] [address]    Report Status PENDING  Incomplete  Respiratory Panel by RT PCR (Flu A&B, Covid) - Nasopharyngeal Swab     Status: None   Collection Time: 06/17/20 12:19 PM   Specimen: Nasopharyngeal Swab  Result Value Ref Range Status   SARS Coronavirus 2 by RT PCR NEGATIVE NEGATIVE Final    Comment: (NOTE) SARS-CoV-2 target nucleic acids are NOT DETECTED.  The SARS-CoV-2 RNA is generally detectable in upper respiratoy specimens during the acute phase of infection. The lowest concentration of SARS-CoV-2 viral copies this assay can detect is 131 copies/mL. A negative result does not preclude SARS-Cov-2 infection and should not be used as the sole basis for  treatment or other patient management decisions. A negative result may occur with  improper specimen collection/handling, submission of specimen other than nasopharyngeal swab, presence of viral mutation(s) within the areas targeted by this assay, and inadequate number of viral copies (<131 copies/mL). A negative result must be combined with clinical observations, patient history, and epidemiological information. The expected result is Negative.  Fact Sheet for Patients:  PinkCheek.be  Fact Sheet for Healthcare Providers:  GravelBags.it  This test is no t yet approved or cleared by the [place] FDA and  has been authorized for detection and/or diagnosis of SARS-CoV-2 by FDA under an Emergency Use Authorization (EUA). This EUA will remain  in effect (meaning this test can be used) for the duration of the COVID-19 declaration under Section 564(b)(1) of the Act, 21 U.S.C. section 360bbb-3(b)(1), unless the authorization is terminated or revoked sooner.     Influenza A by PCR NEGATIVE NEGATIVE Final   Influenza B by PCR NEGATIVE NEGATIVE Final    Comment: (NOTE) The Xpert Xpress SARS-CoV-2/FLU/RSV assay is intended as an aid in  the diagnosis of influenza from Nasopharyngeal swab specimens and  should not be used as a sole basis for treatment. Nasal washings and  aspirates are unacceptable for Xpert Xpress SARS-CoV-2/FLU/RSV  testing.  Fact Sheet for Patients: PinkCheek.be  Fact Sheet for Healthcare Providers: GravelBags.it  This test is not yet approved or cleared by the [place] FDA and  has been authorized for detection and/or diagnosis of SARS-CoV-2 by  FDA under an Emergency Use Authorization (EUA). This EUA will remain  in effect (meaning this test can be used) for the duration of the  Covid-19 declaration under Section 564(b)(1) of the Act, 21    U.S.C. section 360bbb-3(b)(1), unless the authorization is  terminated or revoked. Performed at [facility], [place] [address] [place], [place] [address]   Urine culture     Status:  None   Collection Time: 06/17/20  5:45 PM   Specimen: Urine, Catheterized  Result Value Ref Range Status   Specimen Description URINE, CATHETERIZED  Final   Special Requests NONE  Final   Culture   Final    NO GROWTH Performed at [facility], [address], [place], [place] [address]    Report Status 06/18/2020 FINAL  Final    FURTHER DISCHARGE INSTRUCTIONS:  Get Medicines reviewed and adjusted: Please take all your medications with you for your next visit with your Primary MD  Laboratory/radiological data: Please request your Primary MD to go over all hospital tests and procedure/radiological results at the follow up, please ask your Primary MD to get all Hospital records sent to his/her office.  In some cases, they will be blood work, cultures and biopsy results pending at the time of your discharge. Please request that your primary care M.D. goes through all the records of your hospital data and follows up on these results.  Also Note the following: If you experience worsening of your admission symptoms, develop shortness of breath, life threatening emergency, suicidal or homicidal thoughts you must seek medical attention immediately by calling 911 or calling your MD immediately  if symptoms less severe.  You must read complete instructions/literature along with all the possible adverse reactions/side effects for all the Medicines you take and that have been prescribed to you. Take any new Medicines after you have completely understood and accpet all the possible adverse reactions/side effects.   Do not drive when taking Pain medications or sleeping medications (Benzodaizepines)  Do not take more than prescribed Pain, Sleep and Anxiety Medications. It is not advisable to combine  anxiety,sleep and pain medications without talking with your primary care practitioner  Special Instructions: If you have smoked or chewed Tobacco  in the last 2 yrs please stop smoking, stop any regular Alcohol  and or any Recreational drug use.  Wear Seat belts while driving.  Please note: You were cared for by a hospitalist during your hospital stay. Once you are discharged, your primary care physician will handle any further medical issues. Please note that NO REFILLS for any discharge medications will be authorized once you are discharged, as it is imperative that you return to your primary care physician (or establish a relationship with a primary care physician if you do not have one) for your post hospital discharge needs so that they can reassess your need for medications and monitor your lab values.  Total Time spent coordinating discharge including counseling, education and face to face time equals35 minutes.  SignedOren Binet 06/21/2020 9:47 AM

## 2020-06-21 NOTE — Progress Notes (Signed)
Occupational Therapy Treatment Patient Details Name: Alexa Mcdonald MRN: [id number] DOB: Jan 22, 1921 Today's Date: 06/21/2020    History of present illness Pt is a 86 year old female who presented to the hospital with AMS after being found on the floor in the kitchen by staff at her independent living apartments at [facility], per chart. It is unclear how and when she fell. The patient is disoriented to dates and times at basline due to her dementia, per chart. CT negative for acute changes in head or cervical spine and radiographs negative for acute fractures. PMH significant for: tachycardia-bradycardia, stroke, aoritc stenosis, s/p TAVR, paroxysmal a-fib, osteoporosis, mitral regurgitation, CAD, LBBB, interstitial lung disease, hx of orthostatic hypotension, HOH, HTN, DM, CKD, chronic diastolic heart failure, dementia, and 1st degree AV block.   OT comments  Patient continues to make progress towards goals in skilled OT session. Patient's session encompassed therapeutic exercises due to increased time to follow all commands, however pt more alert than in sessions previous. Pt's HR remaining in the 50s even with exertion, and required extended rest breaks due to raspy respirations and coughing with exertion (O2 above 97 the entire time). Discharge remains appropriate; will continue to follow acutely.    Follow Up Recommendations  SNF    Equipment Recommendations  Other (comment) (defer to next venue)    Recommendations for Other Services      Precautions / Restrictions Precautions Precautions: Fall Precaution Comments: orthostatic BP, dementia Restrictions Weight Bearing Restrictions: No       Mobility Bed Mobility                  Transfers                      Balance                                           ADL either performed or assessed with clinical judgement   ADL Overall ADL's : Needs assistance/impaired     Grooming: Set  up;Supervision/safety;Cueing for safety;Bed level;Wash/dry hands;Wash/dry face Grooming Details (indicate cue type and reason): Increased cues needed                             Functional mobility during ADLs: Maximal assistance General ADL Comments: Pt presenting  with poor activity tolerance, balance, strength, and cognition.      Vision       Perception     Praxis      Cognition Arousal/Alertness: Awake/alert Behavior During Therapy: WFL for tasks assessed/performed Overall Cognitive Status: Impaired/Different from baseline                                 General Comments: Pt with baseline dementia. Unsure of baseline cognitive level.        Exercises Total Joint Exercises Ankle Circles/Pumps: AAROM;Both;10 reps Straight Leg Raises: AAROM;Both;10 reps;Seated (modified from seated position) General Exercises - Upper Extremity Shoulder Flexion: AROM;5 reps;Both   Shoulder Instructions       General Comments      Pertinent Vitals/ Pain       Pain Assessment: Faces Faces Pain Scale: Hurts a little bit Pain Location: generalized with mobility Pain Descriptors / Indicators: Grimacing Pain Intervention(s): Limited activity within patient's  tolerance;Monitored during session  Home Living                                          Prior Functioning/Environment              Frequency  Min 2X/week        Progress Toward Goals  OT Goals(current goals can now be found in the care plan section)  Progress towards OT goals: Progressing toward goals  Acute Rehab OT Goals Patient Stated Goal: None stated OT Goal Formulation: With patient Time For Goal Achievement: 07/03/20 Potential to Achieve Goals: Good  Plan Discharge plan remains appropriate    Co-evaluation                 AM-PAC OT "6 Clicks" Daily Activity     Outcome Measure   Help from another person eating meals?: A Little Help from another  person taking care of personal grooming?: A Little Help from another person toileting, which includes using toliet, bedpan, or urinal?: A Lot Help from another person bathing (including washing, rinsing, drying)?: A Lot Help from another person to put on and taking off regular upper body clothing?: A Little Help from another person to put on and taking off regular lower body clothing?: A Lot 6 Click Score: 15    End of Session    OT Visit Diagnosis: Unsteadiness on feet (R26.81);Other abnormalities of gait and mobility (R26.89);Muscle weakness (generalized) (M62.81)   Activity Tolerance Patient limited by fatigue   Patient Left in bed;with call bell/phone within reach;with bed alarm set   Nurse Communication Mobility status        Time: 1610-9604 OT Time Calculation (min): 10 min  Charges: OT General Charges $OT Visit: 1 Visit OT Treatments $Therapeutic Exercise: 8-22 mins  Corinne Ports E. Malerie Eakins, COTA/L Acute Rehabilitation Services [phone] [place] 06/21/2020, 11:46 AM

## 2020-06-21 NOTE — TOC Transition Note (Signed)
Transition of Care [facility]) - CM/SW Discharge Note   Patient Details  Name: Alexa Mcdonald MRN: [id number] Date of Birth: 1921-07-24  Transition of Care [facility]) CM/SW Contact:  Vinie Sill, [place] Phone Number: 06/21/2020, 1:47 PM   Clinical Narrative:    Patient will DC to: [facility]  DC Date: 06/21/2020 Family Notified: [name],son Transport [id number]   Per MD patient is ready for discharge. RN, patient, and facility notified of DC. Discharge Summary sent to facility. RN given number for [phone]. Ambulance transport requested for patient.   Clinical Social Worker signing off.  Thurmond Butts, MSW, [place] Clinical Social Worker     Final next level of care: Skilled Nursing Facility Barriers to Discharge: Continued Medical Work up   Patient Goals and CMS Choice        Discharge Placement              Patient chooses bed at: [facility] Patient to be transferred to facility by: [facility] Name of family member notified: [name], son Patient and family notified of of transfer: 06/21/20  Discharge Plan and Services In-house Referral: Clinical Social Work                                   Social Determinants of Health (SDOH) Interventions     Readmission Risk Interventions No flowsheet data found.

## 2020-06-22 DIAGNOSIS — I10 Essential (primary) hypertension: Secondary | ICD-10-CM | POA: Diagnosis not present

## 2020-06-22 DIAGNOSIS — R531 Weakness: Secondary | ICD-10-CM | POA: Diagnosis not present

## 2020-06-22 DIAGNOSIS — R269 Unspecified abnormalities of gait and mobility: Secondary | ICD-10-CM | POA: Diagnosis not present

## 2020-06-22 DIAGNOSIS — F0390 Unspecified dementia without behavioral disturbance: Secondary | ICD-10-CM | POA: Diagnosis not present

## 2020-06-22 DIAGNOSIS — E1140 Type 2 diabetes mellitus with diabetic neuropathy, unspecified: Secondary | ICD-10-CM | POA: Diagnosis not present

## 2020-06-22 DIAGNOSIS — D509 Iron deficiency anemia, unspecified: Secondary | ICD-10-CM | POA: Diagnosis not present

## 2020-06-22 DIAGNOSIS — I480 Paroxysmal atrial fibrillation: Secondary | ICD-10-CM | POA: Diagnosis not present

## 2020-06-22 LAB — CULTURE, BLOOD (ROUTINE X 2)
Culture: NO GROWTH
Culture: NO GROWTH
Special Requests: ADEQUATE

## 2020-07-10 DIAGNOSIS — Z20822 Contact with and (suspected) exposure to covid-19: Secondary | ICD-10-CM | POA: Diagnosis not present

## 2020-07-12 DIAGNOSIS — Z20822 Contact with and (suspected) exposure to covid-19: Secondary | ICD-10-CM | POA: Diagnosis not present

## 2020-07-12 DIAGNOSIS — I83022 Varicose veins of left lower extremity with ulcer of calf: Secondary | ICD-10-CM | POA: Diagnosis not present

## 2020-07-13 DIAGNOSIS — E1140 Type 2 diabetes mellitus with diabetic neuropathy, unspecified: Secondary | ICD-10-CM | POA: Diagnosis not present

## 2020-07-13 DIAGNOSIS — I10 Essential (primary) hypertension: Secondary | ICD-10-CM | POA: Diagnosis not present

## 2020-07-13 DIAGNOSIS — R269 Unspecified abnormalities of gait and mobility: Secondary | ICD-10-CM | POA: Diagnosis not present

## 2020-07-13 DIAGNOSIS — F0390 Unspecified dementia without behavioral disturbance: Secondary | ICD-10-CM | POA: Diagnosis not present

## 2020-07-13 DIAGNOSIS — D509 Iron deficiency anemia, unspecified: Secondary | ICD-10-CM | POA: Diagnosis not present

## 2020-07-13 DIAGNOSIS — I480 Paroxysmal atrial fibrillation: Secondary | ICD-10-CM | POA: Diagnosis not present

## 2020-07-13 DIAGNOSIS — R531 Weakness: Secondary | ICD-10-CM | POA: Diagnosis not present

## 2020-07-19 DIAGNOSIS — I83018 Varicose veins of right lower extremity with ulcer other part of lower leg: Secondary | ICD-10-CM | POA: Diagnosis not present

## 2020-07-22 ENCOUNTER — Other Ambulatory Visit: Payer: Self-pay | Admitting: Cardiology

## 2020-07-23 NOTE — Telephone Encounter (Signed)
Pt last saw Dr [name] 08/16/19, last labs 06/21/20 Creat 1.83, age 86, weight 58.1kg, based on specified criteria pt is on appropriate dosage of Eliquis 2.5mg  BID.  Will refill rx.

## 2020-07-26 DIAGNOSIS — I83028 Varicose veins of left lower extremity with ulcer other part of lower leg: Secondary | ICD-10-CM | POA: Diagnosis not present

## 2020-07-29 ENCOUNTER — Other Ambulatory Visit: Payer: Self-pay | Admitting: Gastroenterology

## 2020-08-09 DIAGNOSIS — L97819 Non-pressure chronic ulcer of other part of right lower leg with unspecified severity: Secondary | ICD-10-CM | POA: Diagnosis not present

## 2020-09-03 ENCOUNTER — Other Ambulatory Visit: Payer: Self-pay | Admitting: Gastroenterology

## 2020-09-07 DIAGNOSIS — I352 Nonrheumatic aortic (valve) stenosis with insufficiency: Secondary | ICD-10-CM | POA: Diagnosis not present

## 2020-09-07 DIAGNOSIS — E1122 Type 2 diabetes mellitus with diabetic chronic kidney disease: Secondary | ICD-10-CM | POA: Diagnosis not present

## 2020-09-07 DIAGNOSIS — Z0000 Encounter for general adult medical examination without abnormal findings: Secondary | ICD-10-CM | POA: Diagnosis not present

## 2020-09-07 DIAGNOSIS — Z8739 Personal history of other diseases of the musculoskeletal system and connective tissue: Secondary | ICD-10-CM | POA: Diagnosis not present

## 2020-09-07 DIAGNOSIS — E7800 Pure hypercholesterolemia, unspecified: Secondary | ICD-10-CM | POA: Diagnosis not present

## 2020-09-07 DIAGNOSIS — Z79899 Other long term (current) drug therapy: Secondary | ICD-10-CM | POA: Diagnosis not present

## 2020-09-07 DIAGNOSIS — E559 Vitamin D deficiency, unspecified: Secondary | ICD-10-CM | POA: Diagnosis not present

## 2020-09-07 DIAGNOSIS — D692 Other nonthrombocytopenic purpura: Secondary | ICD-10-CM | POA: Diagnosis not present

## 2020-09-07 DIAGNOSIS — I10 Essential (primary) hypertension: Secondary | ICD-10-CM | POA: Diagnosis not present

## 2020-09-07 DIAGNOSIS — E0520 Thyrotoxicosis with toxic multinodular goiter without thyrotoxic crisis or storm: Secondary | ICD-10-CM | POA: Diagnosis not present

## 2020-09-07 DIAGNOSIS — I639 Cerebral infarction, unspecified: Secondary | ICD-10-CM | POA: Diagnosis not present

## 2020-09-07 DIAGNOSIS — E0590 Thyrotoxicosis, unspecified without thyrotoxic crisis or storm: Secondary | ICD-10-CM | POA: Diagnosis not present

## 2020-10-08 DIAGNOSIS — Z20828 Contact with and (suspected) exposure to other viral communicable diseases: Secondary | ICD-10-CM | POA: Diagnosis not present

## 2020-10-08 DIAGNOSIS — Z1159 Encounter for screening for other viral diseases: Secondary | ICD-10-CM | POA: Diagnosis not present

## 2020-10-15 DIAGNOSIS — Z20828 Contact with and (suspected) exposure to other viral communicable diseases: Secondary | ICD-10-CM | POA: Diagnosis not present

## 2020-10-15 DIAGNOSIS — Z1159 Encounter for screening for other viral diseases: Secondary | ICD-10-CM | POA: Diagnosis not present

## 2020-10-22 DIAGNOSIS — Z1159 Encounter for screening for other viral diseases: Secondary | ICD-10-CM | POA: Diagnosis not present

## 2020-10-22 DIAGNOSIS — Z20828 Contact with and (suspected) exposure to other viral communicable diseases: Secondary | ICD-10-CM | POA: Diagnosis not present

## 2020-10-29 DIAGNOSIS — Z1159 Encounter for screening for other viral diseases: Secondary | ICD-10-CM | POA: Diagnosis not present

## 2020-10-29 DIAGNOSIS — Z20828 Contact with and (suspected) exposure to other viral communicable diseases: Secondary | ICD-10-CM | POA: Diagnosis not present

## 2020-11-05 DIAGNOSIS — Z1159 Encounter for screening for other viral diseases: Secondary | ICD-10-CM | POA: Diagnosis not present

## 2020-11-05 DIAGNOSIS — Z20828 Contact with and (suspected) exposure to other viral communicable diseases: Secondary | ICD-10-CM | POA: Diagnosis not present

## 2020-11-12 DIAGNOSIS — Z20828 Contact with and (suspected) exposure to other viral communicable diseases: Secondary | ICD-10-CM | POA: Diagnosis not present

## 2020-11-12 DIAGNOSIS — Z1159 Encounter for screening for other viral diseases: Secondary | ICD-10-CM | POA: Diagnosis not present

## 2020-11-19 DIAGNOSIS — Z20828 Contact with and (suspected) exposure to other viral communicable diseases: Secondary | ICD-10-CM | POA: Diagnosis not present

## 2020-11-19 DIAGNOSIS — Z1159 Encounter for screening for other viral diseases: Secondary | ICD-10-CM | POA: Diagnosis not present

## 2020-11-21 ENCOUNTER — Encounter (HOSPITAL_COMMUNITY): Payer: Self-pay

## 2020-11-21 ENCOUNTER — Emergency Department (HOSPITAL_COMMUNITY)
Admission: EM | Admit: 2020-11-21 | Discharge: 2020-11-21 | Disposition: A | Discharge status: Home / Self Care | Payer: Medicare Other | Attending: Emergency Medicine | Admitting: Emergency Medicine

## 2020-11-21 ENCOUNTER — Emergency Department (HOSPITAL_COMMUNITY): Payer: Medicare Other

## 2020-11-21 ENCOUNTER — Other Ambulatory Visit: Payer: Self-pay

## 2020-11-21 DIAGNOSIS — S0101XA Laceration without foreign body of scalp, initial encounter: Secondary | ICD-10-CM | POA: Diagnosis not present

## 2020-11-21 DIAGNOSIS — R011 Cardiac murmur, unspecified: Secondary | ICD-10-CM | POA: Insufficient documentation

## 2020-11-21 DIAGNOSIS — R410 Disorientation, unspecified: Secondary | ICD-10-CM | POA: Diagnosis not present

## 2020-11-21 DIAGNOSIS — Y92000 Kitchen of unspecified non-institutional (private) residence as  the place of occurrence of the external cause: Secondary | ICD-10-CM | POA: Diagnosis not present

## 2020-11-21 DIAGNOSIS — I6529 Occlusion and stenosis of unspecified carotid artery: Secondary | ICD-10-CM | POA: Diagnosis not present

## 2020-11-21 DIAGNOSIS — R0902 Hypoxemia: Secondary | ICD-10-CM | POA: Diagnosis not present

## 2020-11-21 DIAGNOSIS — S199XXA Unspecified injury of neck, initial encounter: Secondary | ICD-10-CM | POA: Diagnosis not present

## 2020-11-21 DIAGNOSIS — E049 Nontoxic goiter, unspecified: Secondary | ICD-10-CM | POA: Diagnosis not present

## 2020-11-21 DIAGNOSIS — M47812 Spondylosis without myelopathy or radiculopathy, cervical region: Secondary | ICD-10-CM | POA: Diagnosis not present

## 2020-11-21 DIAGNOSIS — W19XXXA Unspecified fall, initial encounter: Secondary | ICD-10-CM | POA: Insufficient documentation

## 2020-11-21 DIAGNOSIS — I4891 Unspecified atrial fibrillation: Secondary | ICD-10-CM | POA: Insufficient documentation

## 2020-11-21 DIAGNOSIS — R58 Hemorrhage, not elsewhere classified: Secondary | ICD-10-CM | POA: Diagnosis not present

## 2020-11-21 DIAGNOSIS — R600 Localized edema: Secondary | ICD-10-CM | POA: Insufficient documentation

## 2020-11-21 DIAGNOSIS — I6782 Cerebral ischemia: Secondary | ICD-10-CM | POA: Diagnosis not present

## 2020-11-21 DIAGNOSIS — Z7901 Long term (current) use of anticoagulants: Secondary | ICD-10-CM | POA: Diagnosis not present

## 2020-11-21 DIAGNOSIS — S0990XA Unspecified injury of head, initial encounter: Secondary | ICD-10-CM | POA: Diagnosis present

## 2020-11-21 DIAGNOSIS — I6381 Other cerebral infarction due to occlusion or stenosis of small artery: Secondary | ICD-10-CM | POA: Diagnosis not present

## 2020-11-21 DIAGNOSIS — G319 Degenerative disease of nervous system, unspecified: Secondary | ICD-10-CM | POA: Diagnosis not present

## 2020-11-21 LAB — COMPREHENSIVE METABOLIC PANEL
ALT: 12 U/L (ref 0–44)
AST: 17 U/L (ref 15–41)
Albumin: 3.7 g/dL (ref 3.5–5.0)
Alkaline Phosphatase: 59 U/L (ref 38–126)
Anion gap: 12 (ref 5–15)
BUN: 20 mg/dL (ref 8–23)
CO2: 23 mmol/L (ref 22–32)
Calcium: 9.1 mg/dL (ref 8.9–10.3)
Chloride: 104 mmol/L (ref 98–111)
Creatinine, Ser: 1.43 mg/dL — ABNORMAL HIGH (ref 0.44–1.00)
GFR, Estimated: 33 mL/min — ABNORMAL LOW (ref >60)
Glucose, Bld: 147 mg/dL — ABNORMAL HIGH (ref 70–99)
Potassium: 4.1 mmol/L (ref 3.5–5.1)
Sodium: 139 mmol/L (ref 135–145)
Total Bilirubin: 2.1 mg/dL — ABNORMAL HIGH (ref 0.3–1.2)
Total Protein: 6.5 g/dL (ref 6.5–8.1)

## 2020-11-21 LAB — URINALYSIS, ROUTINE W REFLEX MICROSCOPIC
Bacteria, UA: NONE SEEN
Bilirubin Urine: NEGATIVE
Glucose, UA: NEGATIVE mg/dL
Ketones, ur: 5 mg/dL — AB
Leukocytes,Ua: NEGATIVE
Nitrite: NEGATIVE
Protein, ur: 100 mg/dL — AB
Specific Gravity, Urine: 1.02 (ref 1.005–1.030)
pH: 5 (ref 5.0–8.0)

## 2020-11-21 LAB — CBC WITH DIFFERENTIAL/PLATELET
Abs Immature Granulocytes: 0.11 10*3/uL — ABNORMAL HIGH (ref 0.00–0.07)
Basophils Absolute: 0 10*3/uL (ref 0.0–0.1)
Basophils Relative: 0 %
Eosinophils Absolute: 0 10*3/uL (ref 0.0–0.5)
Eosinophils Relative: 0 %
HCT: 41.4 % (ref 36.0–46.0)
Hemoglobin: 13.4 g/dL (ref 12.0–15.0)
Immature Granulocytes: 1 %
Lymphocytes Relative: 3 %
Lymphs Abs: 0.5 10*3/uL — ABNORMAL LOW (ref 0.7–4.0)
MCH: 31.2 pg (ref 26.0–34.0)
MCHC: 32.4 g/dL (ref 30.0–36.0)
MCV: 96.5 fL (ref 80.0–100.0)
Monocytes Absolute: 0.8 10*3/uL (ref 0.1–1.0)
Monocytes Relative: 5 %
Neutro Abs: 13.6 10*3/uL — ABNORMAL HIGH (ref 1.7–7.7)
Neutrophils Relative %: 91 %
Platelets: 152 10*3/uL (ref 150–400)
RBC: 4.29 MIL/uL (ref 3.87–5.11)
RDW: 14.6 % (ref 11.5–15.5)
WBC: 15 10*3/uL — ABNORMAL HIGH (ref 4.0–10.5)
nRBC: 0 % (ref 0.0–0.2)

## 2020-11-21 LAB — CBG MONITORING, ED: Glucose-Capillary: 136 mg/dL — ABNORMAL HIGH (ref 70–99)

## 2020-11-21 IMAGING — CT CT HEAD W/O CM
4 series · 17 of 47 positions shown, 19 images · non-contrast
Comparison: CT brain [DATE]

CLINICAL DATA: Fall with laceration

EXAM:
CT HEAD WITHOUT CONTRAST
TECHNIQUE: Contiguous axial images were obtained from the base of the skull
through the vertex without intravenous contrast.

[Series 3: head (person_name) · axial · 0.42mm/px · z∈[-112,+12]mm · 7 of 35 slices shown, 9 images]
[im 5/35  brain]
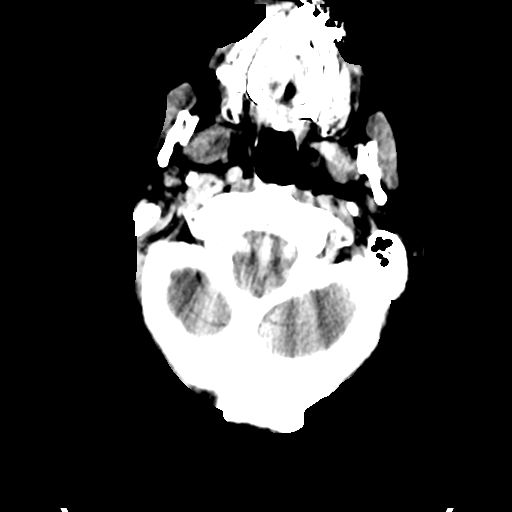
[im 5/35  bone]
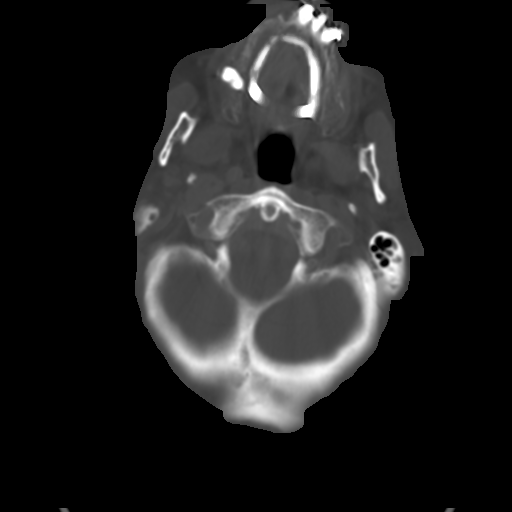
[im 9/35  brain]
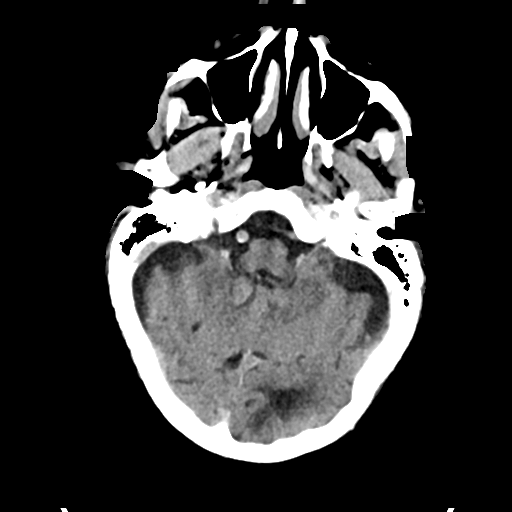
[im 13/35  brain]
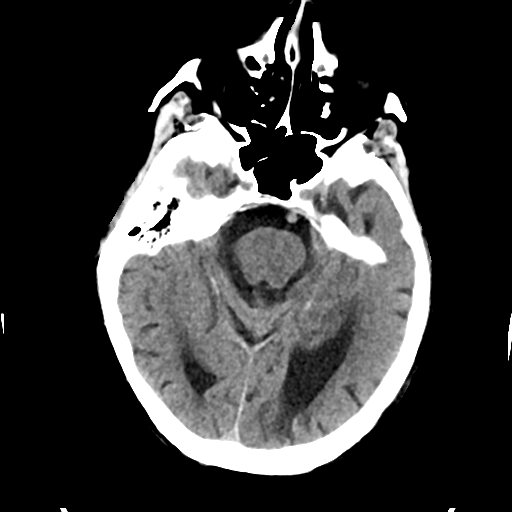
[im 18/35  brain]
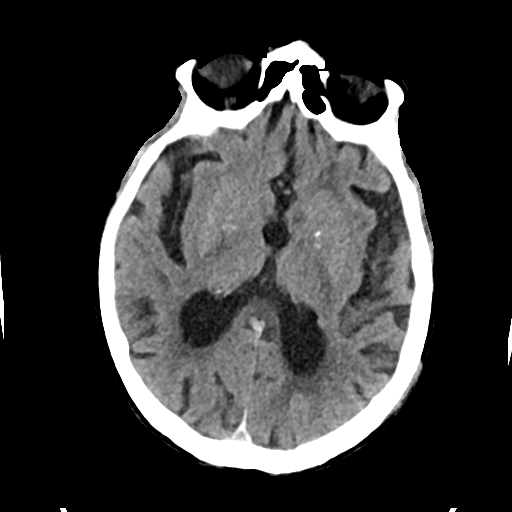
[im 22/35  brain]
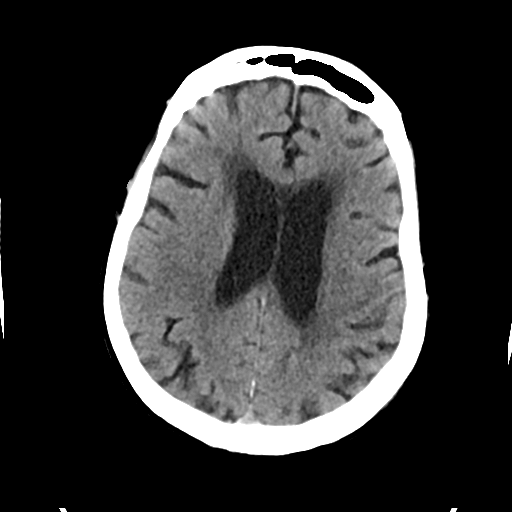
[im 22/35  bone]
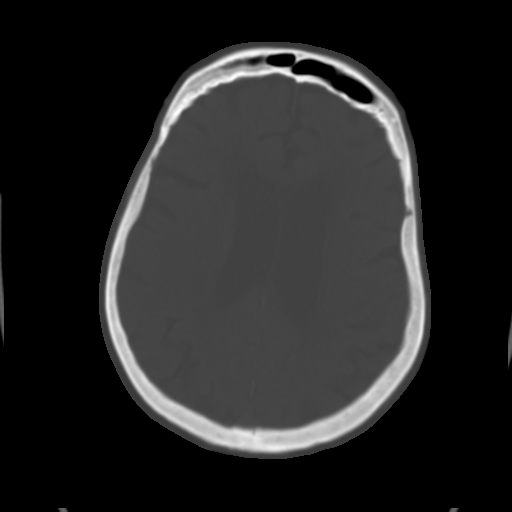
[im 26/35  brain]
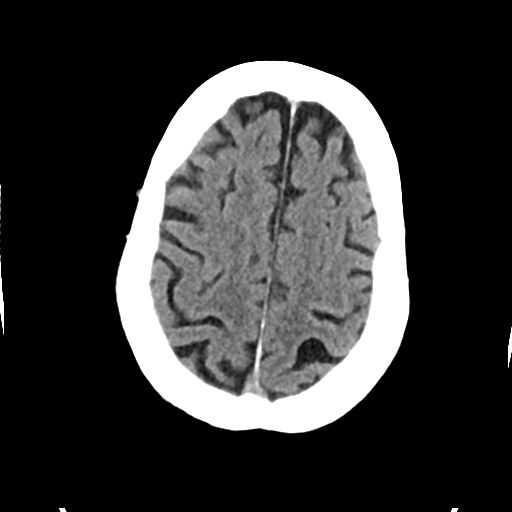
[im 30/35  brain]
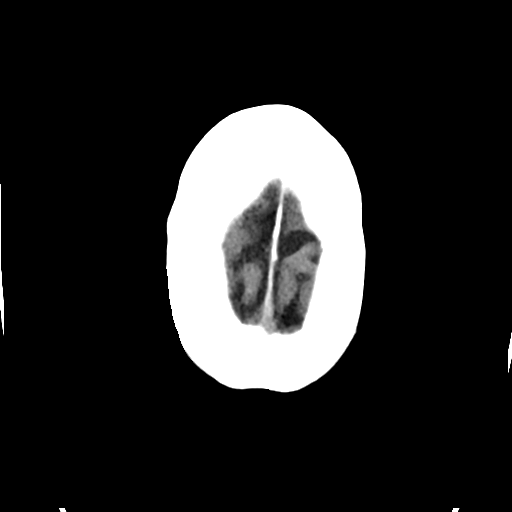

[Series 4: head bone · axial · 0.42mm/px · z∈[-116,-56]mm · 4 of 86 slices shown]
[im 9/86  bone]
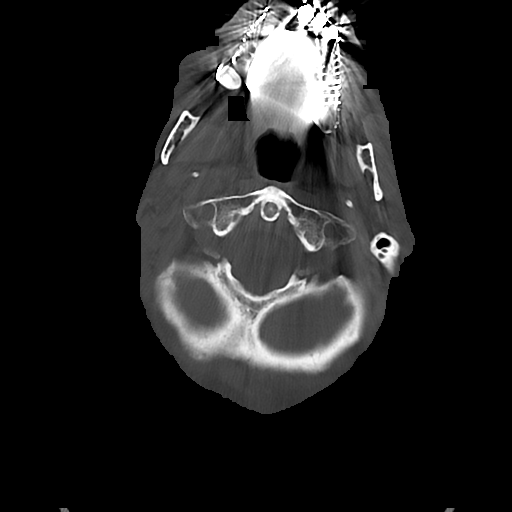
[im 18/86  bone]
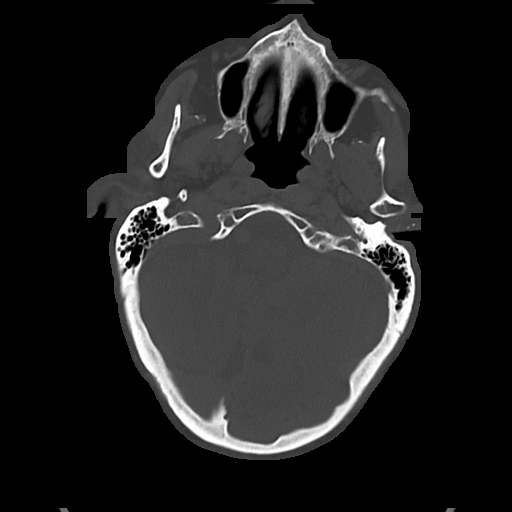
[im 26/86  bone]
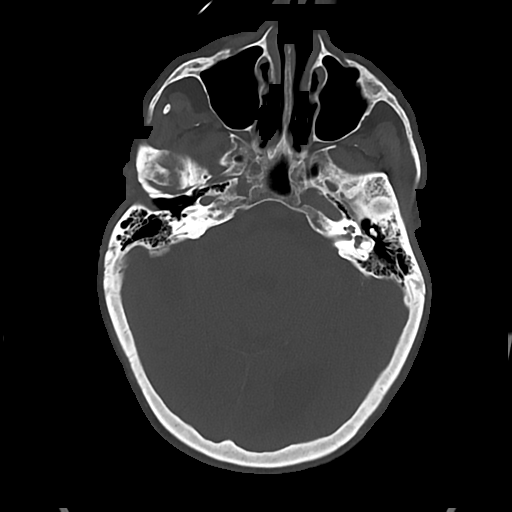
[im 39/86  bone]
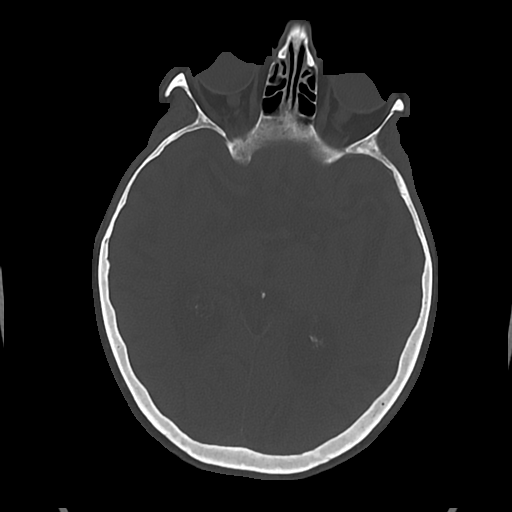

[Series 5: cor soft · coronal · 0.33mm/px · 3 of 71 slices shown]
[im 24/71  brain]
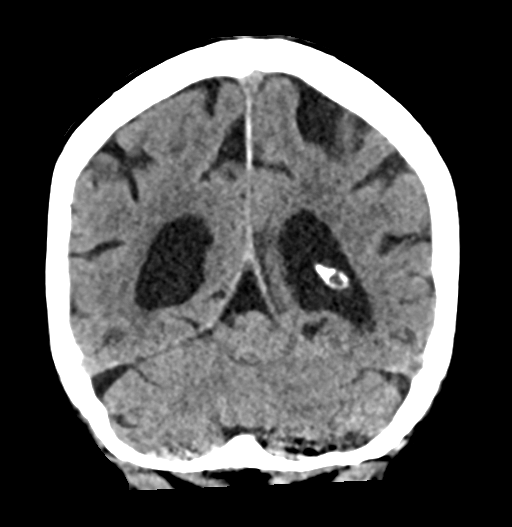
[im 32/71  brain]
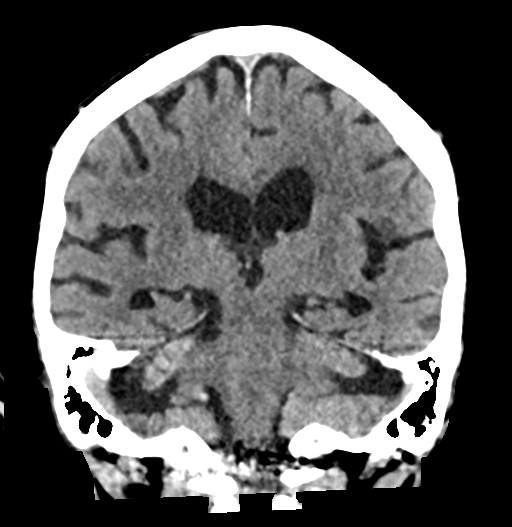
[im 39/71  brain]
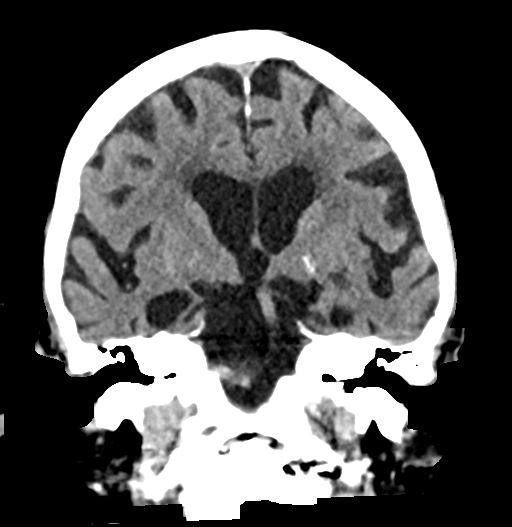

[Series 6: sag soft · sagittal · 0.34mm/px · 3 of 57 slices shown]
[im 19/57  brain]
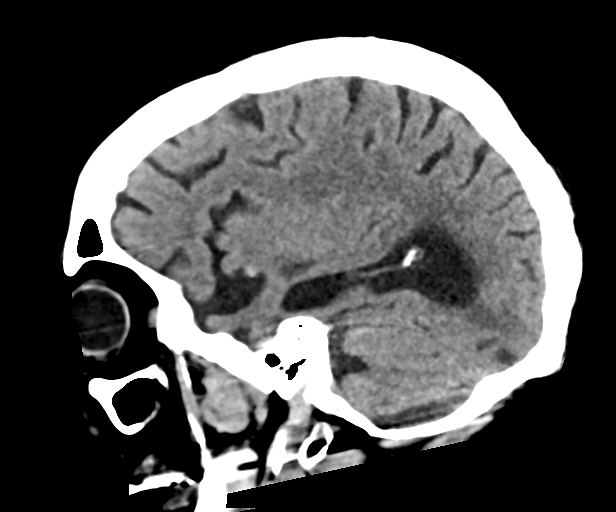
[im 29/57  brain]
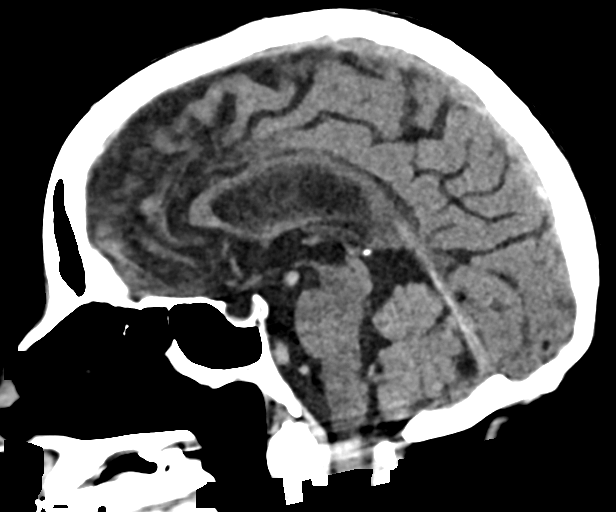
[im 38/57  brain]
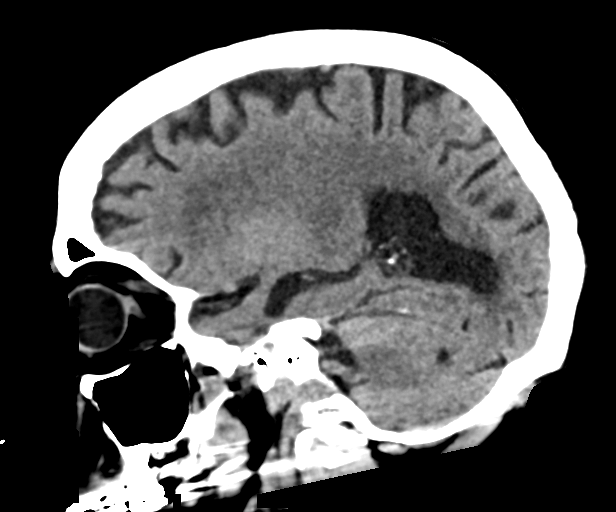

[17 of 47 positions shown; findings below may reference images not displayed]

FINDINGS: Brain: No acute territorial infarction, hemorrhage, or intracranial
mass. Atrophy and chronic small vessel ischemic changes of the white
matter. Small chronic left occipital infarct. Chronic lacunar
infarct in the right cerebellum. Stable ventricle size.

Vascular: No hyperdense vessels.  Carotid vascular calcification

Skull: Normal. Negative for fracture or focal lesion.

Sinuses/Orbits: No acute finding.

Other: Small left posterior parietal scalp hematoma
IMPRESSION: 1. No CT evidence for acute intracranial abnormality.
2. Atrophy and chronic small vessel ischemic changes of the white
matter. Small chronic left occipital and right cerebellar infarcts.

## 2020-11-21 IMAGING — CT CT CERVICAL SPINE W/O CM
4 series · 15 of 33 positions shown, 18 images · non-contrast
Comparison: [DATE], CT [DATE]

CLINICAL DATA: Fall with head trauma

EXAM:
CT CERVICAL SPINE WITHOUT CONTRAST
TECHNIQUE: Multidetector CT imaging of the cervical spine was performed without
intravenous contrast. Multiplanar CT image reconstructions were also
generated.

[Series 4: c spine bone · axial · 0.24mm/px · z∈[-244,-130]mm · 5 of 92 slices shown, 7 images]
[im 16/92  soft-tissue]
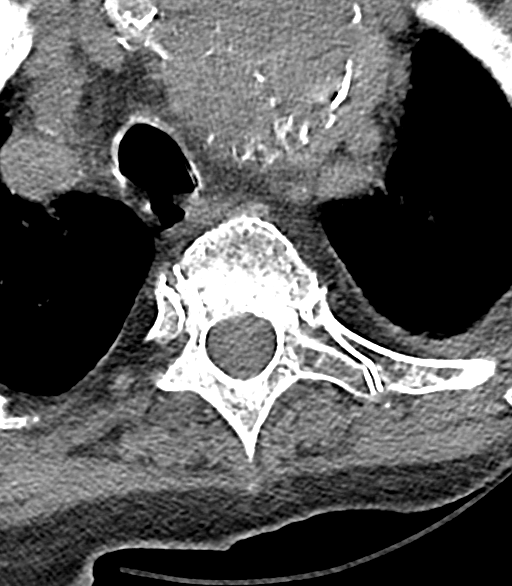
[im 16/92  bone]
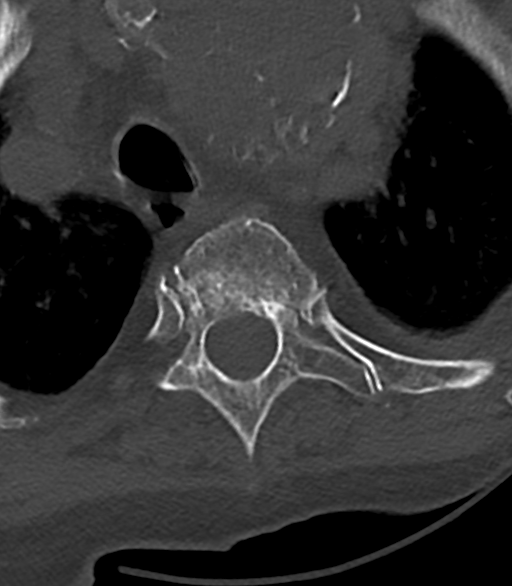
[im 31/92  bone]
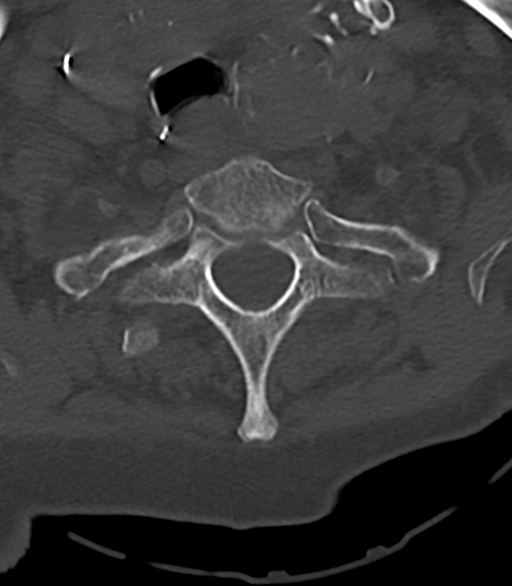
[im 46/92  bone]
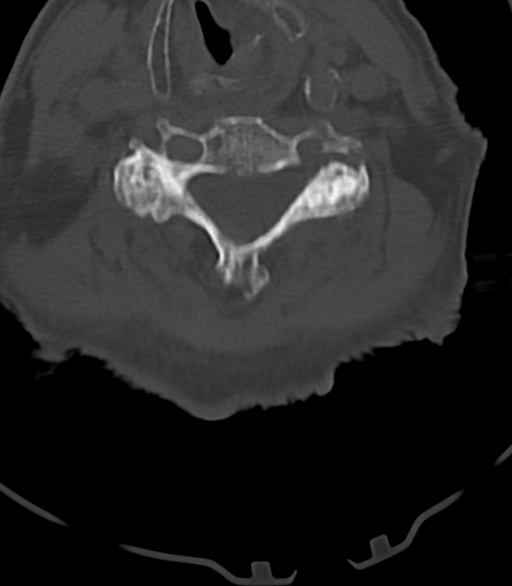
[im 61/92  bone]
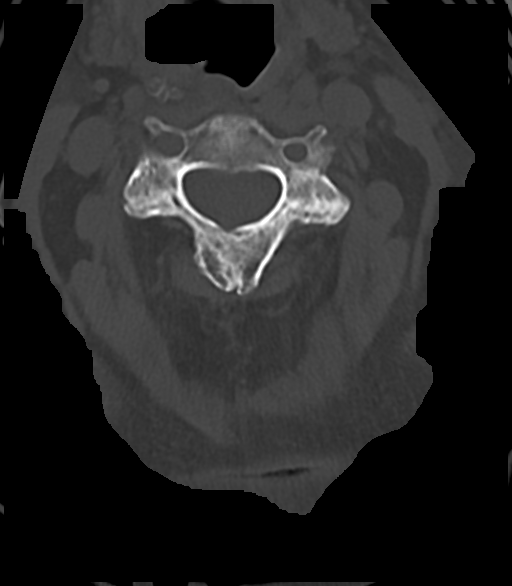
[im 76/92  soft-tissue]
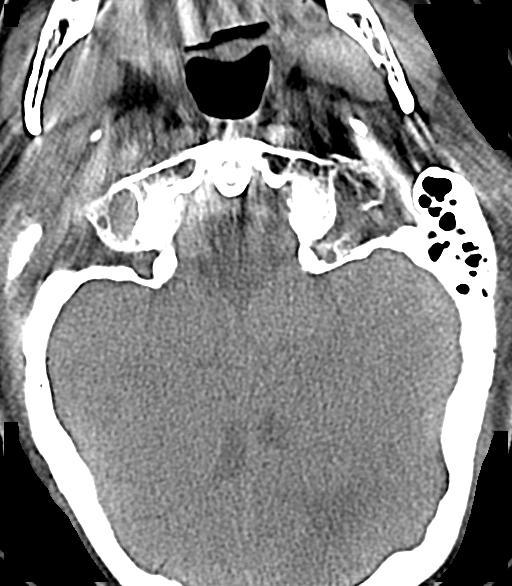
[im 76/92  bone]
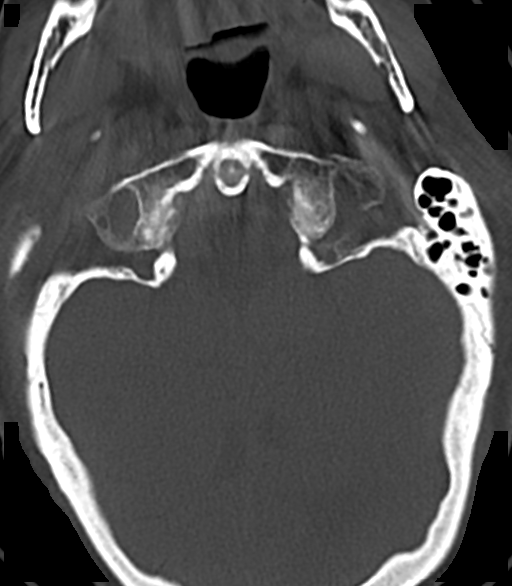

[Series 5: c spine soft · axial · 0.24mm/px · z∈[-247,-216]mm · 2 of 95 slices shown]
[im 16/95  soft-tissue]
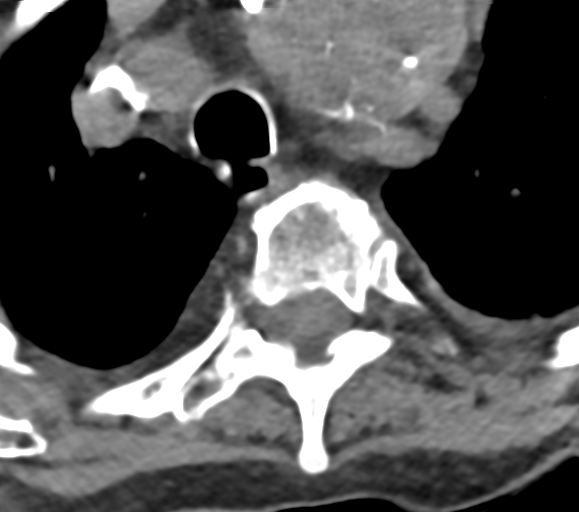
[im 32/95  soft-tissue]
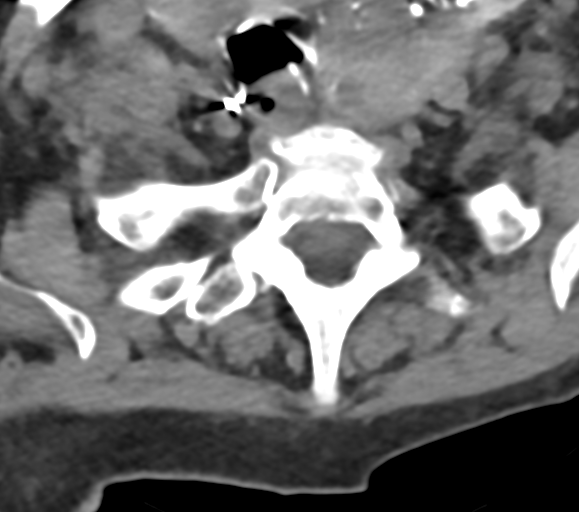

[Series 8: sag bone · sagittal · 0.33mm/px · 5 of 66 slices shown, 6 images]
[im 22/66  bone]
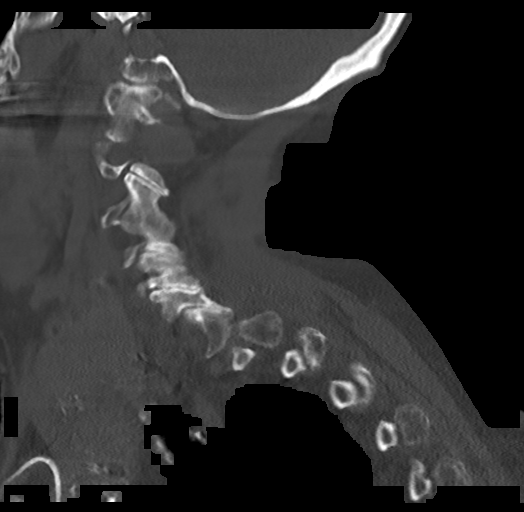
[im 28/66  bone]
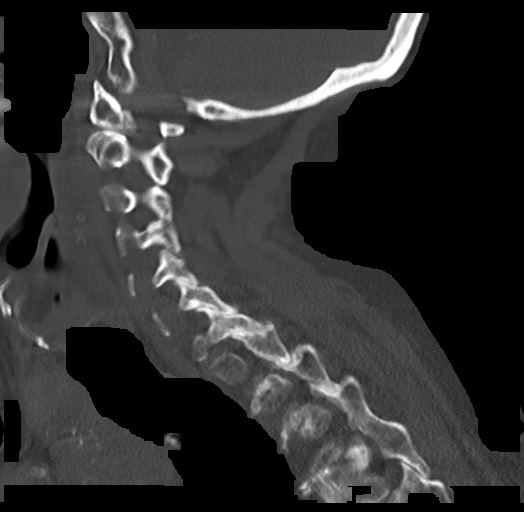
[im 33/66  soft-tissue]
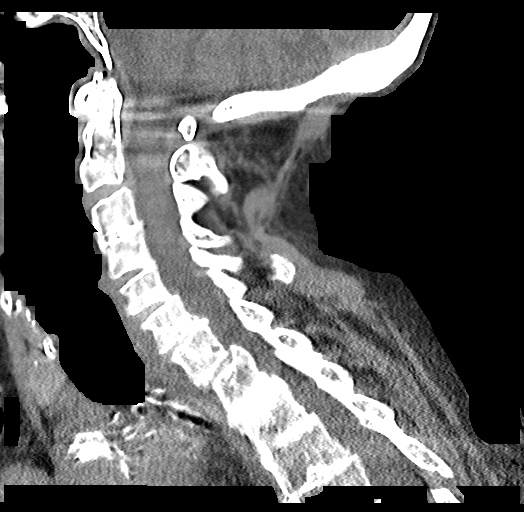
[im 33/66  bone]
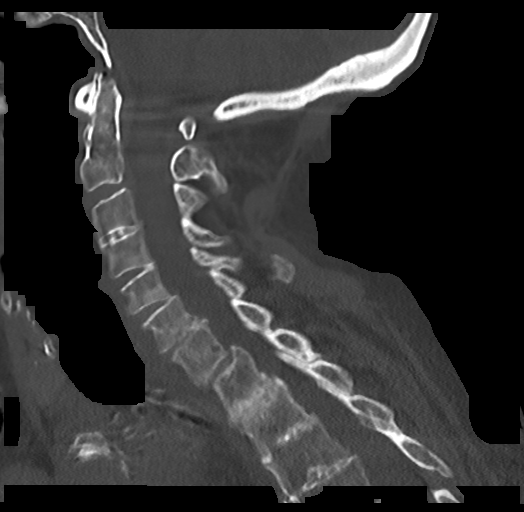
[im 38/66  bone]
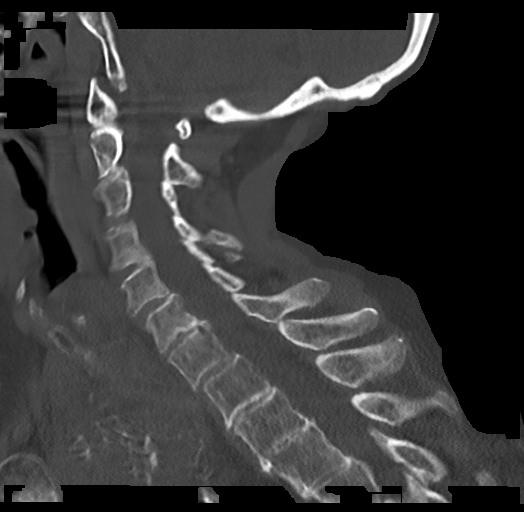
[im 44/66  bone]
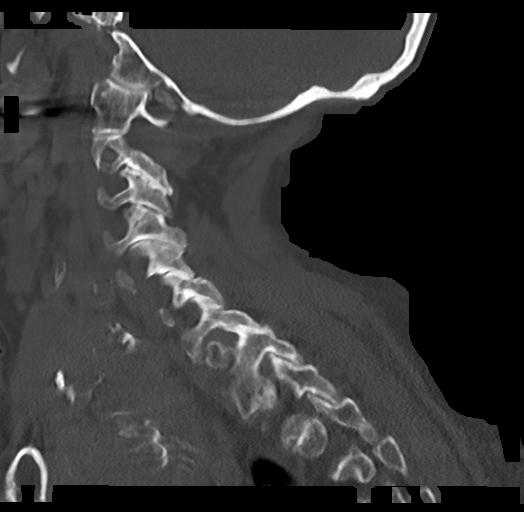

[Series 9: cor bone · coronal · 0.27mm/px · 3 of 81 slices shown]
[im 17/81  bone]
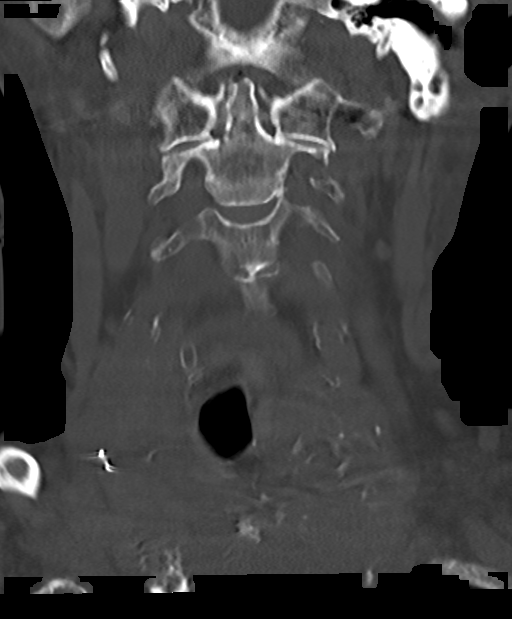
[im 33/81  bone]
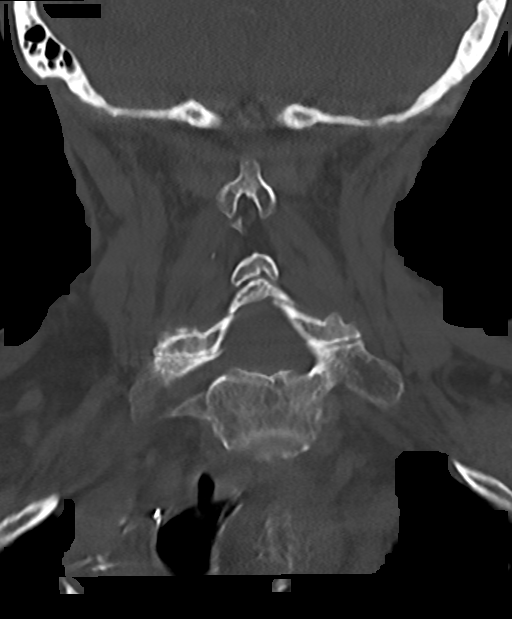
[im 49/81  bone]
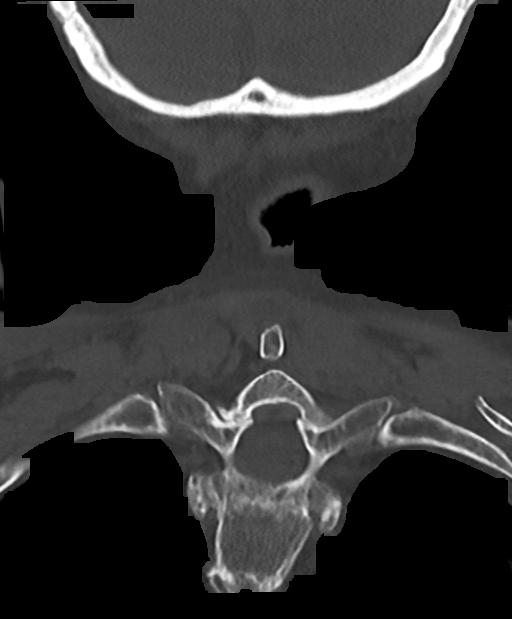

[15 of 33 positions shown; findings below may reference images not displayed]

FINDINGS: Alignment: Normal.

Skull base and vertebrae: No acute fracture. No primary bone lesion
or focal pathologic process.

Soft tissues and spinal canal: No prevertebral fluid or swelling. No
visible canal hematoma.

Disc levels: Multilevel degenerative changes, with marked disc space
narrowing at C6-C7. Facet degenerative changes at multiple levels.

Upper chest: Incompletely visualize enlarge heterogeneous thyroid,
present on prior CT imaging. In the setting of significant
comorbidities or limited life expectancy, no follow-up recommended
(ref: [HOSPITAL]. [DATE]): 143-50).

Other: None
IMPRESSION: Multilevel degenerative changes of the cervical spine without acute
osseous abnormality.

## 2020-11-21 MED ORDER — ACETAMINOPHEN 325 MG PO TABS
650.0000 mg | ORAL_TABLET | Freq: Once | ORAL | Status: AC
  Administered 2020-11-21: 650 mg via ORAL
  Filled 2020-11-21: qty 2

## 2020-11-21 MED ORDER — LIDOCAINE-EPINEPHRINE-TETRACAINE (LET) TOPICAL GEL
3.0000 mL | Freq: Once | TOPICAL | Status: AC
  Administered 2020-11-21: 3 mL via TOPICAL
  Filled 2020-11-21: qty 3

## 2020-11-21 MED ORDER — ACETAMINOPHEN 500 MG PO TABS: 500.0000 mg | ORAL_TABLET | Freq: Four times a day (QID) | ORAL | 0 refills | Status: AC | PRN

## 2020-11-21 NOTE — ED Notes (Signed)
Patient ambulated independently with walker, steady gait, no complaints.

## 2020-11-21 NOTE — ED Notes (Signed)
Pt refused discharge vitals 

## 2020-11-21 NOTE — Discharge Instructions (Signed)
You have been evaluated for your fall.  Fortunately your CT scans did not show any significant injury.  Take tylenol as needed for pain.  Always use your walker to walk.  Follow up with your doctor for further care.

## 2020-11-21 NOTE — ED Notes (Signed)
Patient verbalized understanding of discharge instructions. Opportunity for questions and answers.  

## 2020-11-21 NOTE — ED Provider Notes (Signed)
[facility] EMERGENCY DEPARTMENT Provider Note   CSN: [id number] Arrival date & time: 11/21/20  1724     History No chief complaint on file.   Alexa Mcdonald is a 87 y.o. female.  The history is provided by the EMS personnel, the patient and medical records. No language interpreter was used.  Fall     Is listed as a level 2 trauma brought here via EMS due to a fall currently on blood thinner medication.  Per EMS, patient lives at [facility] assistance living facility when she had an unwitnessed fall at her home.  She was found in the kitchen and there was blood on the ground.  Patient without any specific complaint.  She is currently on Eliquis for atrial fibrillation.  Patient unable to tell me what has happened.  She denies having headache neck pain chest pain trouble breathing abdominal pain back pain or pain to her extremities.  Initial CBG was 155 according to EMS.  Facility unsure when was Eliquis last taken or when she was last known normal.  Level 5 caveat is due to altered mental status.  No past medical history on file.  There are no problems to display for this patient.   The histories are not reviewed yet. Please review them in the "History" navigator section and refresh this [place].   OB History   No obstetric history on file.     No family history on file.     Home Medications Prior to Admission medications   Not on File    Allergies    Patient has no allergy information on record.  Review of Systems   Review of Systems  Unable to perform ROS: Mental status change    Physical Exam Updated Vital Signs BP (!) 162/68   Pulse (!) 59   Temp 98.3 F (36.8 C) (Oral)   Resp (!) 23   Ht 5\' 1"  (1.549 m)   Wt 49.9 kg   SpO2 95%   BMI 20.78 kg/m   Physical Exam Vitals and nursing note reviewed.  Constitutional:      General: She is not in acute distress.    Appearance: She is well-developed.     Comments: Elderly female  laying in bed appears to be in no acute discomfort  HENT:     Head: Normocephalic.     Comments: Less than 1 cm laceration noted to occipital scalp not actively bleeding.  No crepitus.  No raccoon's eyes, no battle sign Eyes:     Extraocular Movements: Extraocular movements intact.     Conjunctiva/sclera: Conjunctivae normal.     Pupils: Pupils are equal, round, and reactive to light.  Neck:     Comments: No significant midline spine tenderness crepitus or step-off Cardiovascular:     Rate and Rhythm: Rhythm irregular.     Heart sounds: Murmur heard.    Pulmonary:     Effort: Pulmonary effort is normal.     Breath sounds: Normal breath sounds. No wheezing, rhonchi or rales.  Abdominal:     Palpations: Abdomen is soft.     Tenderness: There is no abdominal tenderness.  Musculoskeletal:     Cervical back: Normal range of motion and neck supple.     Right lower leg: Edema present.     Left lower leg: Edema present.     Comments: Able to move all 4 extremities with equal strength.  No tenderness or signs of injury to her extremities.  1+ pitting  edema to lower extremities bilaterally extending towards the knees  Skin:    Capillary Refill: Capillary refill takes less than 2 seconds.     Findings: No rash.  Neurological:     Mental Status: She is alert. She is disoriented.  Psychiatric:        Mood and Affect: Mood normal.     ED Results / Procedures / Treatments   Labs (all labs ordered are listed, but only abnormal results are displayed) Labs Reviewed  CBC WITH DIFFERENTIAL/PLATELET - Abnormal; Notable for the following components:      Result Value   WBC 15.0 (*)    Neutro Abs 13.6 (*)    Lymphs Abs 0.5 (*)    Abs Immature Granulocytes 0.11 (*)    All other components within normal limits  COMPREHENSIVE METABOLIC PANEL - Abnormal; Notable for the following components:   Glucose, Bld 147 (*)    Creatinine, Ser 1.43 (*)    Total Bilirubin 2.1 (*)    GFR, Estimated 33  (*)    All other components within normal limits  URINALYSIS, ROUTINE W REFLEX MICROSCOPIC - Abnormal; Notable for the following components:   Color, Urine AMBER (*)    APPearance HAZY (*)    Hgb urine dipstick SMALL (*)    Ketones, ur 5 (*)    Protein, ur 100 (*)    All other components within normal limits  CBG MONITORING, ED - Abnormal; Notable for the following components:   Glucose-Capillary 136 (*)    All other components within normal limits    EKG EKG Interpretation  Date/Time:  Wednesday November 21 2020 17:40:53 EDT Ventricular Rate:  62 PR Interval:    QRS Duration: 149 QT Interval:  498 QTC Calculation: 506 R Axis:   -33 Text Interpretation: Atrial fibrillation Left bundle branch block No old tracing to compare Confirmed by Aletta Edouard [phone]) on 11/21/2020 5:44:53 PM   Radiology CT Head Wo Contrast  Result Date: 11/21/2020 CLINICAL DATA:  Fall with laceration EXAM: CT HEAD WITHOUT CONTRAST TECHNIQUE: Contiguous axial images were obtained from the base of the skull through the vertex without intravenous contrast. COMPARISON:  CT brain 06/17/2020 FINDINGS: Brain: No acute territorial infarction, hemorrhage, or intracranial mass. Atrophy and chronic small vessel ischemic changes of the white matter. Small chronic left occipital infarct. Chronic lacunar infarct in the right cerebellum. Stable ventricle size. Vascular: No hyperdense vessels.  Carotid vascular calcification Skull: Normal. Negative for fracture or focal lesion. Sinuses/Orbits: No acute finding. Other: Small left posterior parietal scalp hematoma IMPRESSION: 1. No CT evidence for acute intracranial abnormality. 2. Atrophy and chronic small vessel ischemic changes of the white matter. Small chronic left occipital and right cerebellar infarcts. Electronically Signed   By: Donavan Foil M.D.   On: 11/21/2020 18:41   CT Cervical Spine Wo Contrast  Result Date: 11/21/2020 CLINICAL DATA:  Fall with head trauma EXAM:  CT CERVICAL SPINE WITHOUT CONTRAST TECHNIQUE: Multidetector CT imaging of the cervical spine was performed without intravenous contrast. Multiplanar CT image reconstructions were also generated. COMPARISON:  06/17/2020, CT 04/25/2009 FINDINGS: Alignment: Normal. Skull base and vertebrae: No acute fracture. No primary bone lesion or focal pathologic process. Soft tissues and spinal canal: No prevertebral fluid or swelling. No visible canal hematoma. Disc levels: Multilevel degenerative changes, with marked disc space narrowing at C6-C7. Facet degenerative changes at multiple levels. Upper chest: Incompletely visualize enlarge heterogeneous thyroid, present on prior CT imaging. In the setting of significant comorbidities or limited life  expectancy, no follow-up recommended (ref: J Am Coll Radiol. 2015 Feb;12(2): 143-50). Other: None IMPRESSION: Multilevel degenerative changes of the cervical spine without acute osseous abnormality. Electronically Signed   By: Donavan Foil M.D.   On: 11/21/2020 18:45    Procedures Procedures   Medications Ordered in ED Medications  lidocaine-EPINEPHrine-tetracaine (LET) topical gel (3 mLs Topical Given 11/21/20 1923)    ED Course  I have reviewed the triage vital signs and the nursing notes.  Pertinent labs & imaging results that were available during my care of the patient were reviewed by me and considered in my medical decision making (see chart for details).  Clinical Course as of 11/21/20 1857  Wed Nov 21, 2480  1540 87 year old female history of dementia here after an unwitnessed fall at her facility.  Scalp laceration.  Patient herself has no complaints.  Getting CT head cervical spine and some basic labs.  Likely return back to facility if no acute findings. [MB]    Clinical Course User Index [MB] Hayden Rasmussen, MD   MDM Rules/Calculators/A&P                          BP (!) 162/68   Pulse (!) 59   Temp 98.3 F (36.8 C) (Oral)   Resp (!) 23    Ht 5\' 1"  (1.549 m)   Wt 49.9 kg   SpO2 95%   BMI 20.78 kg/m   Final Clinical Impression(s) / ED Diagnoses Final diagnoses:  Fall at nursing home, initial encounter  Laceration of scalp, initial encounter    Rx / DC Orders ED Discharge Orders         Ordered    acetaminophen (TYLENOL) 500 MG tablet  Every 6 hours PRN        11/21/20 2217         5:42 PM This is an elderly female currently on Eliquis for atrial fibrillation who was found on the ground at her apartment from an unwitnessed fall.  She suffered a small laceration to her occipital scalp.  At this time she is without any specific complaint but she is a poor historian.  She was not placed on a cervical spine collar when arrived.  Although there is no significant midline spine tenderness, C-spine collar was applied in the ED and will obtain head and cervical spine CT scan due to her injury.  No other obvious signs of injury noted on initial exam.  Currently awaits family members to give additional history.  9:38 PM Head and cervical spine CT without acute injury.  Laceration to occiput is small enough that it doesn't require laceration repair.  UA without UTI.  Labs are reassuring.  Pt able to ambulate with assisted device.  I discussed care with Dr. Melina Copa and we felt pt is stable to return to her facility.  Per daughter, there are 2 hrs well check by the staff so pt should have adequate monitoring. No active bleeding.  No stroke sxs.  VSS.     Domenic Moras, PA-C 11/21/20 2219    Hayden Rasmussen, MD 11/22/20 [phone]

## 2020-11-21 NOTE — ED Triage Notes (Signed)
Per EMS: Unwitnessed fall, on Eloquis. Laceration to back of head, found on floor. Facility unsure when Eloquis was last taken

## 2020-11-21 NOTE — ED Notes (Signed)
Daughter transporting pt. Pts facility [facility] [place] aware pt is returning

## 2020-11-21 NOTE — Progress Notes (Signed)
Orthopedic Tech Progress Note Patient Details:  Leandrew Koyanagi 08/04/1873 [id number] Level 2 trauma Patient ID: Alphia Moh Doe, female   DOB: 08/04/1873, 87 y.o.   MRN: [id number]   Janit Pagan 11/21/2020, 5:31 PM

## 2020-11-22 ENCOUNTER — Encounter (HOSPITAL_COMMUNITY): Payer: Self-pay

## 2020-11-26 DIAGNOSIS — Z1159 Encounter for screening for other viral diseases: Secondary | ICD-10-CM | POA: Diagnosis not present

## 2020-11-26 DIAGNOSIS — Z20828 Contact with and (suspected) exposure to other viral communicable diseases: Secondary | ICD-10-CM | POA: Diagnosis not present

## 2020-11-30 ENCOUNTER — Emergency Department (HOSPITAL_COMMUNITY)
Admission: EM | Admit: 2020-11-30 | Discharge: 2020-12-01 | Disposition: A | Discharge status: Home / Self Care | Payer: Medicare Other | Source: Home / Self Care | Attending: Emergency Medicine | Admitting: Emergency Medicine

## 2020-11-30 ENCOUNTER — Emergency Department (HOSPITAL_COMMUNITY): Payer: Medicare Other

## 2020-11-30 DIAGNOSIS — N183 Chronic kidney disease, stage 3 unspecified: Secondary | ICD-10-CM | POA: Insufficient documentation

## 2020-11-30 DIAGNOSIS — I4891 Unspecified atrial fibrillation: Secondary | ICD-10-CM | POA: Diagnosis not present

## 2020-11-30 DIAGNOSIS — R55 Syncope and collapse: Secondary | ICD-10-CM | POA: Insufficient documentation

## 2020-11-30 DIAGNOSIS — Z79899 Other long term (current) drug therapy: Secondary | ICD-10-CM | POA: Insufficient documentation

## 2020-11-30 DIAGNOSIS — R5383 Other fatigue: Secondary | ICD-10-CM | POA: Diagnosis not present

## 2020-11-30 DIAGNOSIS — R404 Transient alteration of awareness: Secondary | ICD-10-CM | POA: Diagnosis not present

## 2020-11-30 DIAGNOSIS — I639 Cerebral infarction, unspecified: Secondary | ICD-10-CM | POA: Diagnosis not present

## 2020-11-30 DIAGNOSIS — R4182 Altered mental status, unspecified: Secondary | ICD-10-CM | POA: Diagnosis not present

## 2020-11-30 DIAGNOSIS — R1319 Other dysphagia: Secondary | ICD-10-CM | POA: Diagnosis not present

## 2020-11-30 DIAGNOSIS — R0902 Hypoxemia: Secondary | ICD-10-CM | POA: Diagnosis not present

## 2020-11-30 DIAGNOSIS — T50904A Poisoning by unspecified drugs, medicaments and biological substances, undetermined, initial encounter: Secondary | ICD-10-CM | POA: Diagnosis not present

## 2020-11-30 DIAGNOSIS — I129 Hypertensive chronic kidney disease with stage 1 through stage 4 chronic kidney disease, or unspecified chronic kidney disease: Secondary | ICD-10-CM | POA: Insufficient documentation

## 2020-11-30 DIAGNOSIS — Z8673 Personal history of transient ischemic attack (TIA), and cerebral infarction without residual deficits: Secondary | ICD-10-CM | POA: Insufficient documentation

## 2020-11-30 DIAGNOSIS — J690 Pneumonitis due to inhalation of food and vomit: Secondary | ICD-10-CM | POA: Diagnosis not present

## 2020-11-30 DIAGNOSIS — J811 Chronic pulmonary edema: Secondary | ICD-10-CM | POA: Diagnosis not present

## 2020-11-30 DIAGNOSIS — I130 Hypertensive heart and chronic kidney disease with heart failure and stage 1 through stage 4 chronic kidney disease, or unspecified chronic kidney disease: Secondary | ICD-10-CM | POA: Diagnosis not present

## 2020-11-30 DIAGNOSIS — F0390 Unspecified dementia without behavioral disturbance: Secondary | ICD-10-CM | POA: Insufficient documentation

## 2020-11-30 DIAGNOSIS — I2510 Atherosclerotic heart disease of native coronary artery without angina pectoris: Secondary | ICD-10-CM | POA: Insufficient documentation

## 2020-11-30 DIAGNOSIS — Z7901 Long term (current) use of anticoagulants: Secondary | ICD-10-CM | POA: Insufficient documentation

## 2020-11-30 DIAGNOSIS — E1122 Type 2 diabetes mellitus with diabetic chronic kidney disease: Secondary | ICD-10-CM | POA: Insufficient documentation

## 2020-11-30 DIAGNOSIS — Z20822 Contact with and (suspected) exposure to covid-19: Secondary | ICD-10-CM | POA: Diagnosis not present

## 2020-11-30 DIAGNOSIS — R4020 Unspecified coma: Secondary | ICD-10-CM | POA: Diagnosis not present

## 2020-11-30 DIAGNOSIS — I5032 Chronic diastolic (congestive) heart failure: Secondary | ICD-10-CM | POA: Diagnosis not present

## 2020-11-30 DIAGNOSIS — I4819 Other persistent atrial fibrillation: Secondary | ICD-10-CM | POA: Insufficient documentation

## 2020-11-30 DIAGNOSIS — Z794 Long term (current) use of insulin: Secondary | ICD-10-CM | POA: Insufficient documentation

## 2020-11-30 DIAGNOSIS — R0689 Other abnormalities of breathing: Secondary | ICD-10-CM | POA: Diagnosis not present

## 2020-11-30 DIAGNOSIS — N179 Acute kidney failure, unspecified: Secondary | ICD-10-CM | POA: Diagnosis not present

## 2020-11-30 DIAGNOSIS — I517 Cardiomegaly: Secondary | ICD-10-CM | POA: Diagnosis not present

## 2020-11-30 DIAGNOSIS — J849 Interstitial pulmonary disease, unspecified: Secondary | ICD-10-CM | POA: Diagnosis not present

## 2020-11-30 DIAGNOSIS — R1310 Dysphagia, unspecified: Secondary | ICD-10-CM | POA: Diagnosis not present

## 2020-11-30 DIAGNOSIS — A419 Sepsis, unspecified organism: Secondary | ICD-10-CM | POA: Diagnosis not present

## 2020-11-30 DIAGNOSIS — G9389 Other specified disorders of brain: Secondary | ICD-10-CM | POA: Diagnosis not present

## 2020-11-30 DIAGNOSIS — I6523 Occlusion and stenosis of bilateral carotid arteries: Secondary | ICD-10-CM | POA: Diagnosis not present

## 2020-11-30 DIAGNOSIS — J189 Pneumonia, unspecified organism: Secondary | ICD-10-CM | POA: Diagnosis not present

## 2020-11-30 LAB — URINALYSIS, ROUTINE W REFLEX MICROSCOPIC
Bacteria, UA: NONE SEEN
Bilirubin Urine: NEGATIVE
Glucose, UA: >=500 mg/dL — AB
Hgb urine dipstick: NEGATIVE
Ketones, ur: NEGATIVE mg/dL
Nitrite: NEGATIVE
Protein, ur: NEGATIVE mg/dL
Specific Gravity, Urine: 1.012 (ref 1.005–1.030)
pH: 5 (ref 5.0–8.0)

## 2020-11-30 LAB — CBC WITH DIFFERENTIAL/PLATELET
Abs Immature Granulocytes: 0.13 10*3/uL — ABNORMAL HIGH (ref 0.00–0.07)
Basophils Absolute: 0 10*3/uL (ref 0.0–0.1)
Basophils Relative: 0 %
Eosinophils Absolute: 0 10*3/uL (ref 0.0–0.5)
Eosinophils Relative: 0 %
HCT: 40.3 % (ref 36.0–46.0)
Hemoglobin: 13.1 g/dL (ref 12.0–15.0)
Immature Granulocytes: 1 %
Lymphocytes Relative: 3 %
Lymphs Abs: 0.3 10*3/uL — ABNORMAL LOW (ref 0.7–4.0)
MCH: 31.3 pg (ref 26.0–34.0)
MCHC: 32.5 g/dL (ref 30.0–36.0)
MCV: 96.2 fL (ref 80.0–100.0)
Monocytes Absolute: 0.7 10*3/uL (ref 0.1–1.0)
Monocytes Relative: 5 %
Neutro Abs: 11.5 10*3/uL — ABNORMAL HIGH (ref 1.7–7.7)
Neutrophils Relative %: 91 %
Platelets: 211 10*3/uL (ref 150–400)
RBC: 4.19 MIL/uL (ref 3.87–5.11)
RDW: 14.7 % (ref 11.5–15.5)
WBC: 12.6 10*3/uL — ABNORMAL HIGH (ref 4.0–10.5)
nRBC: 0 % (ref 0.0–0.2)

## 2020-11-30 LAB — BASIC METABOLIC PANEL
Anion gap: 9 (ref 5–15)
BUN: 21 mg/dL (ref 8–23)
CO2: 27 mmol/L (ref 22–32)
Calcium: 8.8 mg/dL — ABNORMAL LOW (ref 8.9–10.3)
Chloride: 99 mmol/L (ref 98–111)
Creatinine, Ser: 1.84 mg/dL — ABNORMAL HIGH (ref 0.44–1.00)
GFR, Estimated: 25 mL/min — ABNORMAL LOW (ref >60)
Glucose, Bld: 204 mg/dL — ABNORMAL HIGH (ref 70–99)
Potassium: 4.7 mmol/L (ref 3.5–5.1)
Sodium: 135 mmol/L (ref 135–145)

## 2020-11-30 LAB — TROPONIN I (HIGH SENSITIVITY)
Troponin I (High Sensitivity): 14 ng/L (ref <18)
Troponin I (High Sensitivity): 13 ng/L (ref <18)

## 2020-11-30 LAB — LACTIC ACID, PLASMA
Lactic Acid, Venous: 2.7 mmol/L (ref 0.5–1.9)
Lactic Acid, Venous: 2.6 mmol/L (ref 0.5–1.9)

## 2020-11-30 LAB — BRAIN NATRIURETIC PEPTIDE: B Natriuretic Peptide: 450.4 pg/mL — ABNORMAL HIGH (ref 0.0–100.0)

## 2020-11-30 IMAGING — DX DG CHEST 1V PORT
1 series · 1 of 1 positions shown · non-contrast
Comparison: Chest x-rays dated [DATE] and [DATE].

CLINICAL DATA: Syncope, unresponsive.

EXAM:
PORTABLE CHEST 1 VIEW

[chest ap]
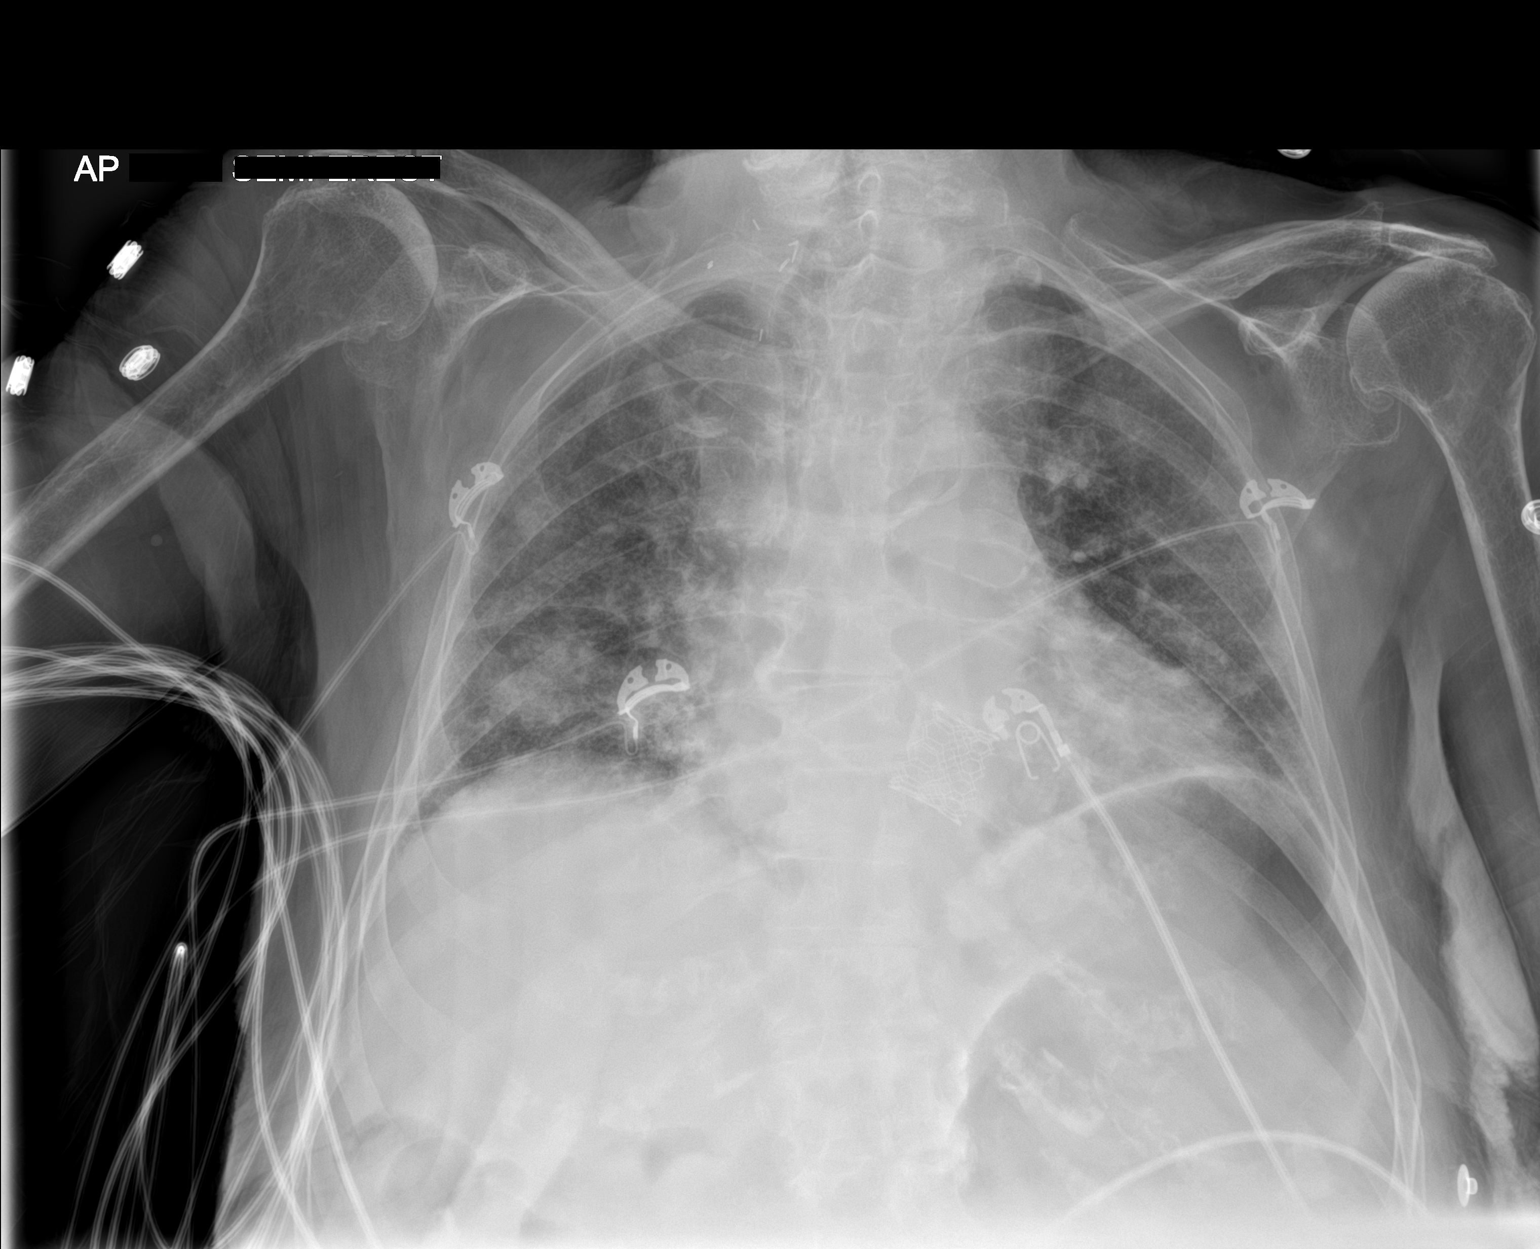

[1 of 1 positions shown; findings below may reference images not displayed]

FINDINGS: Stable cardiomegaly. Patchy bilateral airspace opacities. No pleural
effusion or pneumothorax is seen. Osseous structures about the chest
are unremarkable.

Lucency under the LEFT hemidiaphragm, presumably within
stomach/bowel but free intraperitoneal air cannot be confidently
excluded.
IMPRESSION: 1. Patchy bilateral airspace opacities. Differential includes
multifocal pneumonia, aspiration and pulmonary edema.
2. Stable cardiomegaly.
3. Lucency under the LEFT hemidiaphragm, presumably within
stomach/bowel but free intraperitoneal air cannot be confidently
excluded. Recommend further characterization with plain film of the
abdomen and/or CT.

## 2020-11-30 IMAGING — CT CT HEAD W/O CM
3 of 4 series · 13 of 47 positions shown, 15 images · non-contrast
Comparison: CT head [DATE]

CLINICAL DATA: Unknown cause, mental status change.

EXAM:
CT HEAD WITHOUT CONTRAST
TECHNIQUE: Contiguous axial images were obtained from the base of the skull
through the vertex without intravenous contrast.

[Series 2: head without · axial · non-contrast · 0.43mm/px · z∈[+1663,+1788]mm · 7 of 35 slices shown, 9 images]
[im 5/35  brain]
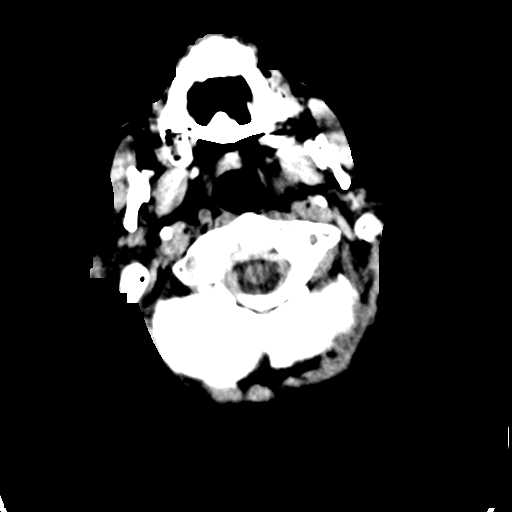
[im 5/35  bone]
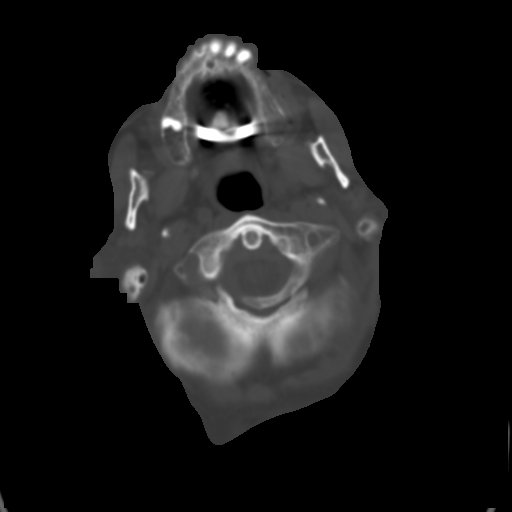
[im 9/35  brain]
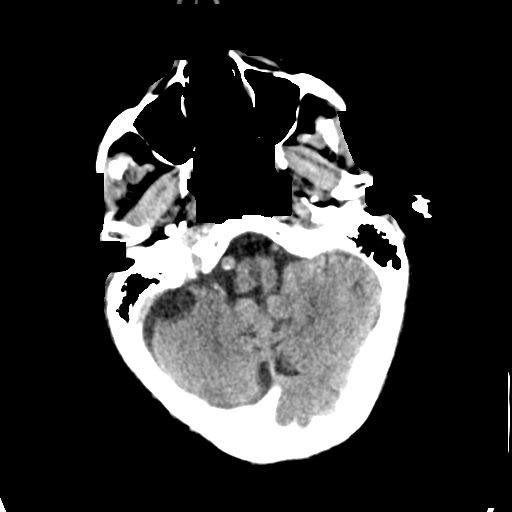
[im 13/35  brain]
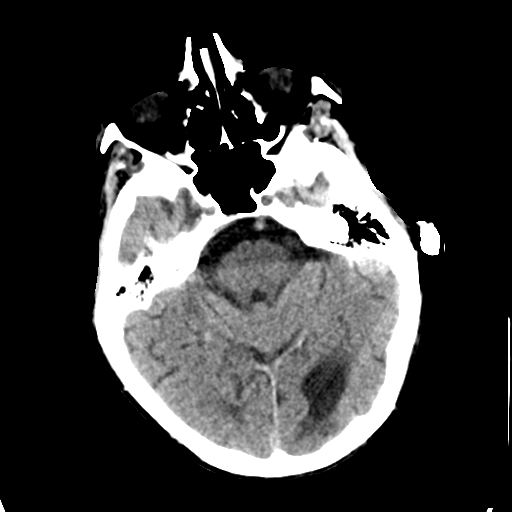
[im 18/35  brain]
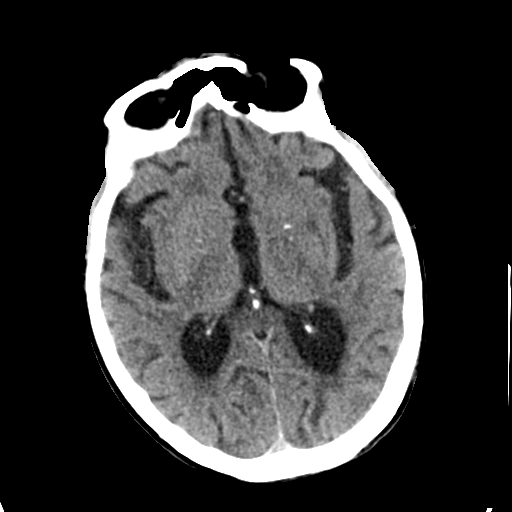
[im 22/35  brain]
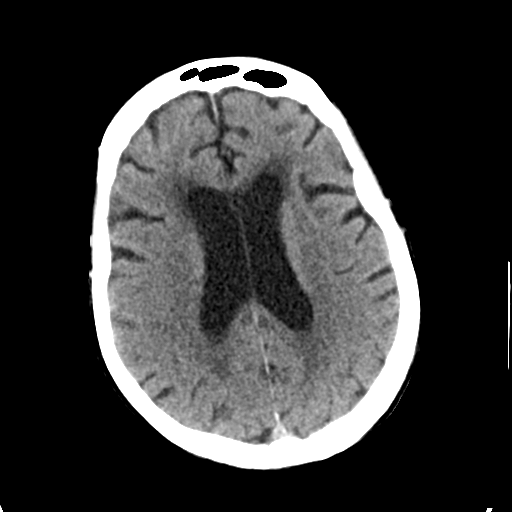
[im 22/35  bone]
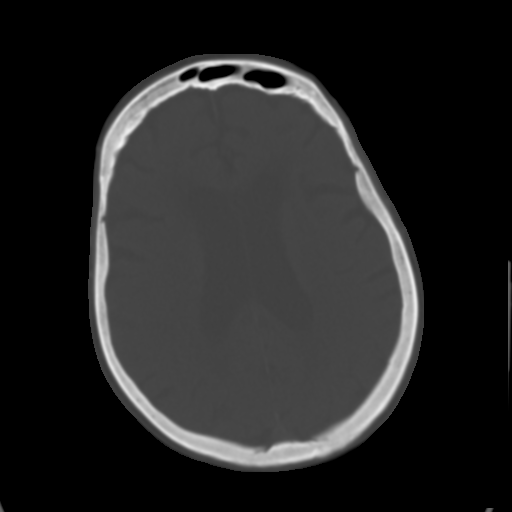
[im 26/35  brain]
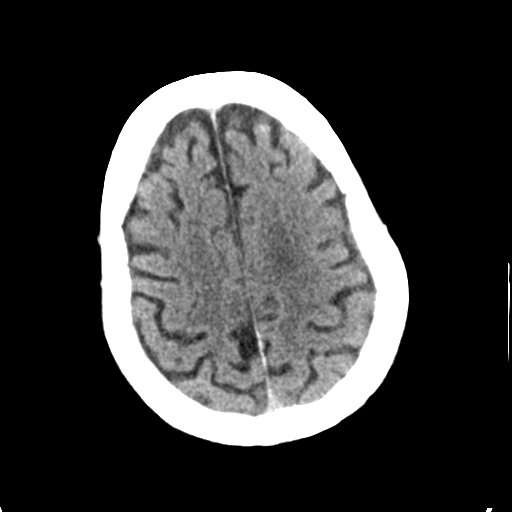
[im 30/35  brain]
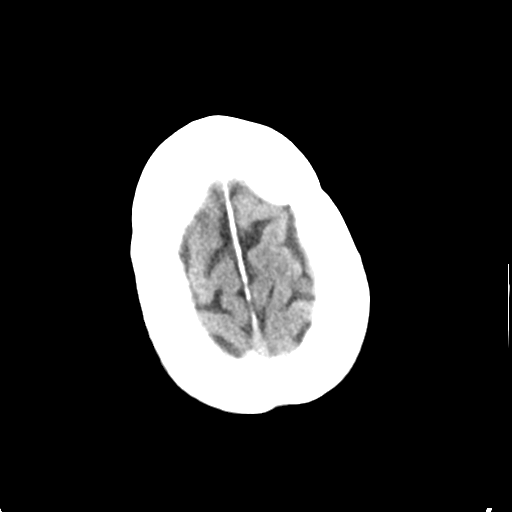

[Series 4: head without cor · coronal · non-contrast · 0.34mm/px · 3 of 67 slices shown]
[im 23/67  brain]
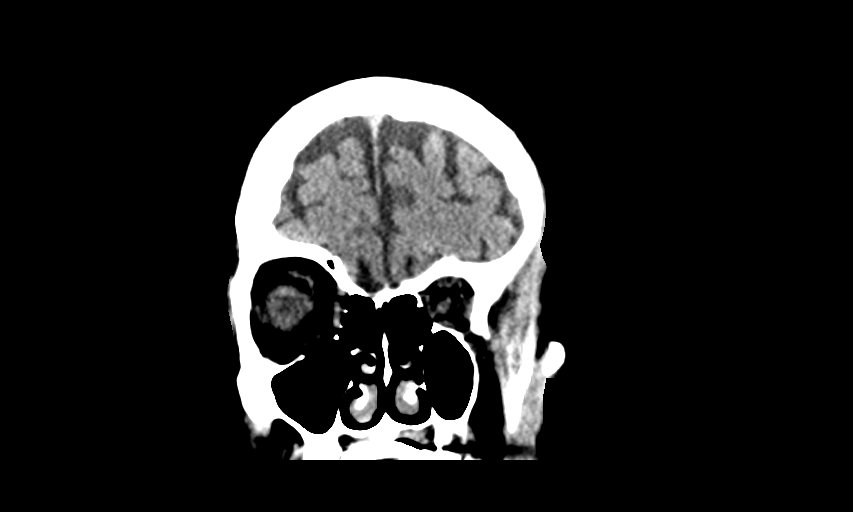
[im 30/67  brain]
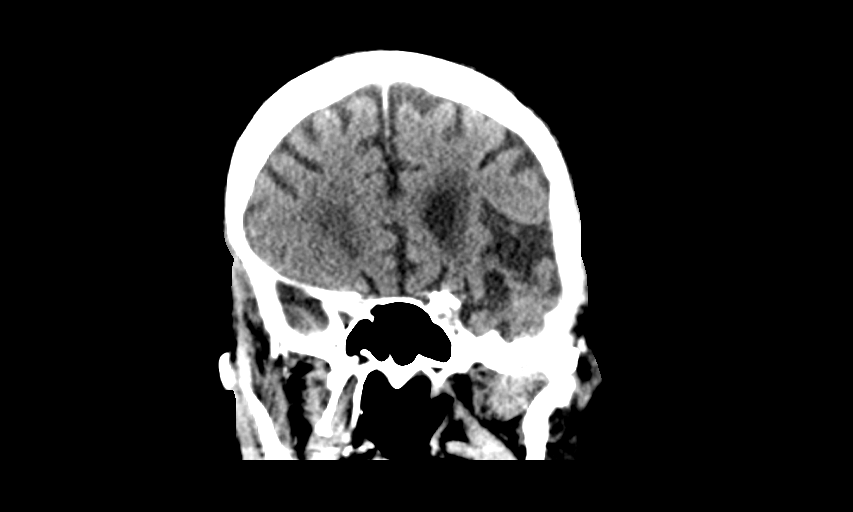
[im 37/67  brain]
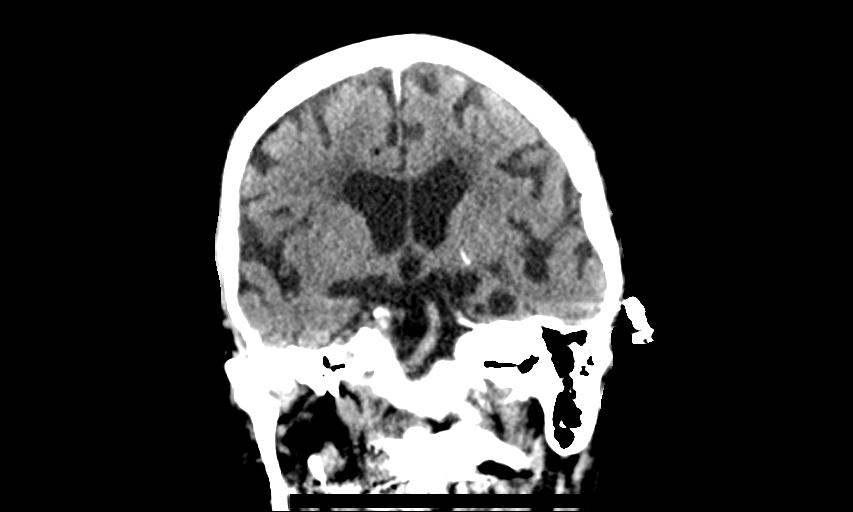

[Series 5: head without sag · sagittal · non-contrast · 0.34mm/px · 3 of 65 slices shown]
[im 22/65  brain]
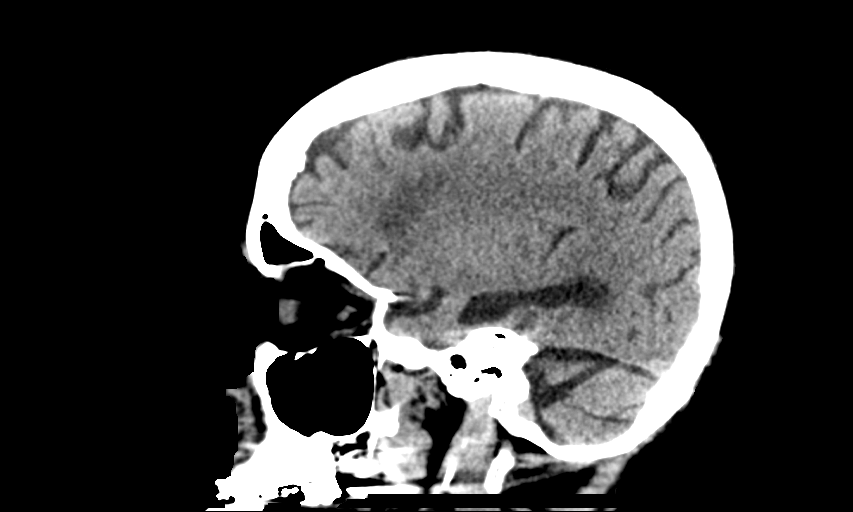
[im 33/65  brain]
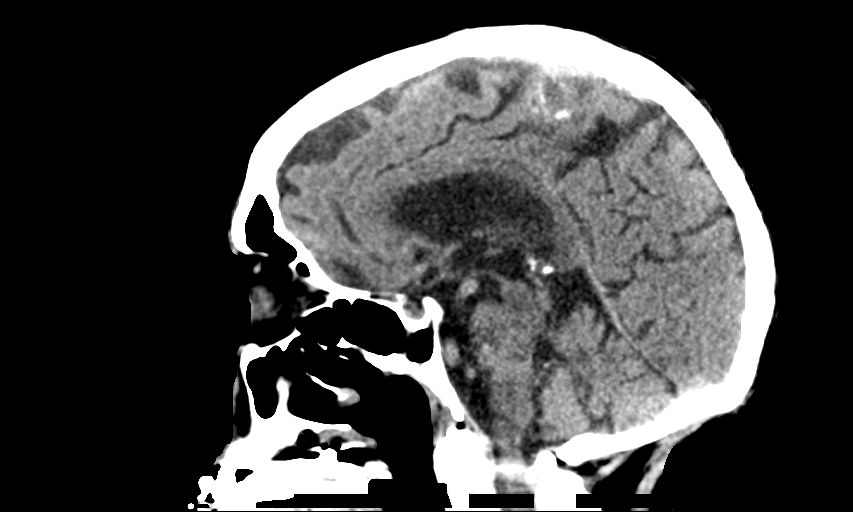
[im 43/65  brain]
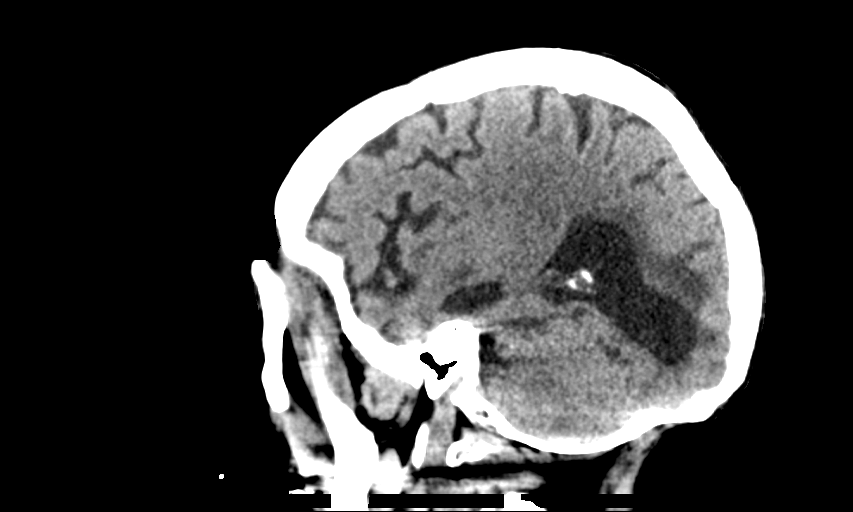

[13 of 47 positions shown; findings below may reference images not displayed]

FINDINGS: Brain:

Cerebral ventricle sizes are concordant with the degree of cerebral
volume loss. Patchy and confluent areas of decreased attenuation are
noted throughout the deep and periventricular white matter of the
cerebral hemispheres bilaterally, compatible with chronic
microvascular ischemic disease. Chronic left occipital infarction.

No evidence of large-territorial acute infarction. No parenchymal
hemorrhage. No mass lesion. No extra-axial collection.

No mass effect or midline shift. No hydrocephalus. Basilar cisterns
are patent.

Vascular: No hyperdense vessel. Atherosclerotic calcifications are
present within the cavernous internal carotid arteries.

Skull: No acute fracture or focal lesion.

Sinuses/Orbits: Paranasal sinuses and mastoid air cells are clear.
Bilateral lens replacement. Otherwise orbits are unremarkable.

Other: None.
IMPRESSION: No acute intracranial abnormality.

## 2020-11-30 NOTE — ED Provider Notes (Signed)
[facility] EMERGENCY DEPARTMENT Provider Note   CSN: [id number] Arrival date & time: 11/30/20  2005     History Chief Complaint  Patient presents with  . Altered Mental Status    Alexa Mcdonald is a 87 y.o. female.  Patient presents from assisted living facility, chief complaint of syncopal episode.  During mealtime patient became unresponsive.  Onlookers initiated CPR but no pulse check was reported.  EMS arrived and patient was given Narcan with reported improvement.  Patient herself unable to provide additional history with reported medical history of dementia.        Past Medical History:  Diagnosis Date  . 1st degree AV block   . Bradycardia   . Chronic diastolic heart failure ([place]) 02/07/2016  . CKD (chronic kidney disease), stage III ([place])   . Colon, diverticulosis   . Diabetes mellitus    diet controlled  . Essential hypertension   . GERD (gastroesophageal reflux disease)   . Hematochezia   . HOH (hard of hearing)    wears bilateral hearing aids  . Hx of orthostatic hypotension   . Interstitial lung disease ([place])   . LBBB (left bundle branch block)   . Long term current use of anticoagulant therapy   . Mediastinal goiter     recurrent s/p resection 1988  . Mild CAD   . Mitral regurgitation   . Osteopenia   . Osteoporosis   . Paroxysmal atrial fibrillation (HCC)    a. CHA2DS2VASc = 5-->coumadin;  b. previously on amio->d/c'd 11/2014 secondary to concern for possible amio lung.  . Pneumonia   . 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 Severe aortic stenosis    s/p TAVR with stable prosthesis by echo 08/2019 - mean AVG 27mmHg  . Stroke ([place])   . Tachycardia-bradycardia ([place])   . Vitamin D deficiency     Patient Active Problem List   Diagnosis Date Noted  . Rhabdomyolysis 06/17/2020  . Lactic acidosis 06/17/2020   . Diabetes mellitus ([place])   . Iron deficiency anemia 12/05/2018  . Duodenal ulcer 12/05/2018  . Gastric erosion 12/05/2018  . History of cholecystectomy 12/05/2018  . History of ERCP 12/05/2018  . Iron deficiency anemia due to chronic blood loss   . Erosive gastritis   . Acute lower GI bleeding 11/09/2018  . Acute blood loss anemia 11/09/2018  . Symptomatic anemia 11/09/2018  . Choledocholithiasis with obstruction 10/23/2018  . Occipital stroke ([place]) 06/11/2016  . Paroxysmal atrial fibrillation ([place]) 06/11/2016  . Homonymous hemianopsia due to old embolic stroke 96/78/9381  . Loss of peripheral visual field 03/12/2016  . Ataxia 03/12/2016  . Dizziness and giddiness 03/12/2016  . Cerebrovascular accident (CVA) due to embolism of posterior cerebral artery with infarctions of both occipital lobes ([place]) 03/12/2016  . Acute on chronic diastolic CHF (congestive heart failure) ([place]) 02/07/2016  . 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 Severe aortic stenosis   . Atrial fibrillation, persistent ([place])   . Mitral regurgitation   . Interstitial lung disease ([place]) 10/30/2014  . Hematochezia 12/13/2013  . CKD (chronic kidney disease), stage III ([place]) 12/13/2013  . Hyperthyroidism 12/13/2013  . GERD (gastroesophageal reflux disease) 12/13/2013  . Chronic anticoagulation 12/13/2013  . Elevated brain natriuretic peptide (BNP) level 12/13/2013  . Sinus bradycardia 12/13/2013  . Pneumonia 12/13/2013  . Encounter for therapeutic drug monitoring 08/29/2013  . Essential  hypertension 08/05/2013  . Edema of both ankles 08/05/2013  . Bradycardia 11/27/2011    Past Surgical History:  Procedure Laterality Date  . BILIARY DILATION  10/24/2018   Procedure: BILIARY DILATION;  Surgeon: Jackquline Denmark, MD;  Location: [facility] ENDOSCOPY;  Service: Endoscopy;;  . BIOPSY  11/11/2018   Procedure: BIOPSY;  Surgeon: Irving Copas., MD;  Location: [facility] ENDOSCOPY;  Service:  Gastroenterology;;  . CARDIAC CATHETERIZATION N/A 10/31/2015   Procedure: Right/Left Heart Cath and Coronary Angiography;  Surgeon: [name], MD;  Location: [facility] LAB;  Service: Cardiovascular;  Laterality: N/A;  . CARPAL TUNNEL RELEASE Bilateral   . CATARACT EXTRACTION Bilateral   . CESAREAN SECTION     x3  . CHOLECYSTECTOMY N/A 10/25/2018   Procedure: LAPAROSCOPIC CHOLECYSTECTOMY WITH INTRAOPERATIVE CHOLANGIOGRAM;  Surgeon: Kieth Brightly Arta Bruce, MD;  Location: [facility] ORS;  Service: General;  Laterality: N/A;  . ERCP N/A 10/24/2018   Procedure: ENDOSCOPIC RETROGRADE CHOLANGIOPANCREATOGRAPHY (ERCP);  Surgeon: Jackquline Denmark, MD;  Location: [facility];  Service: Endoscopy;  Laterality: N/A;  . ESOPHAGOGASTRODUODENOSCOPY (EGD) WITH PROPOFOL N/A 11/11/2018   Procedure: ESOPHAGOGASTRODUODENOSCOPY (EGD) WITH PROPOFOL;  Surgeon: Rush Landmark Telford Nab., MD;  Location: [place];  Service: Gastroenterology;  Laterality: N/A;  . mediastinal removal of a goiter    . MRI  08/18/07   head (di[facility])  . REMOVAL OF STONES  10/24/2018   Procedure: REMOVAL OF STONES;  Surgeon: Jackquline Denmark, MD;  Location: [facility] ENDOSCOPY;  Service: Endoscopy;;  . Joan Mayans  10/24/2018   Procedure: Joan Mayans;  Surgeon: Jackquline Denmark, MD;  Location: [facility] ENDOSCOPY;  Service: Endoscopy;;  . TEE WITHOUT CARDIOVERSION N/A 11/27/2015   Procedure: TRANSESOPHAGEAL ECHOCARDIOGRAM (TEE);  Surgeon: [name], MD;  Location: [place];  Service: Open Heart Surgery;  Laterality: N/A;  . TRANSCATHETER AORTIC VALVE REPLACEMENT, TRANSFEMORAL N/A 11/27/2015   Procedure: TRANSCATHETER AORTIC VALVE REPLACEMENT, TRANSFEMORAL;  Surgeon: [name], MD;  Location: [place];  Service: Open Heart Surgery;  Laterality: N/A;     OB History   No obstetric history on file.     Family History  Problem Relation Age of Onset  . Diabetes Mother   . Cancer - Prostate Father   . Pulmonary embolism Father   .  Hypertension Sister   . Diabetes Sister   . Hypertension Brother   . Arrhythmia Brother        afib  . CAD Brother   . Pulmonary fibrosis Brother   . Colon cancer Neg Hx   . Esophageal cancer Neg Hx   . Inflammatory bowel disease Neg Hx   . Liver disease Neg Hx   . Pancreatic cancer Neg Hx   . Stomach cancer Neg Hx   . Rectal cancer Neg Hx     Social History   Tobacco Use  . Smoking status: Never Smoker  . Smokeless tobacco: Never Used  Vaping Use  . Vaping Use: Never used  Substance Use Topics  . Alcohol use: Not Currently  . Drug use: Never    Home Medications Prior to Admission medications   Medication Sig Start Date End Date Taking? Authorizing Provider  acetaminophen (TYLENOL) 325 MG tablet Take 2 tablets (650 mg total) by mouth every 6 (six) hours as needed for mild pain. 10/26/18   Swayze, Ava, DO  acetaminophen (TYLENOL) 500 MG tablet Take 1-2 tablets (500-1,000 mg total) by mouth every 6 (six) hours as needed for mild pain or moderate pain (or headaches). 11/21/20   Rona Ravens,  Gertie Fey, PA-C  atorvastatin (LIPITOR) 10 MG tablet Take 10 mg by mouth daily.    [provider]  Calcium Carbonate-Vitamin D (CALCIUM-D PO) Take 1 tablet by mouth daily with breakfast. Calcium 1200 mg    [provider]  CALCIUM PO Take 1 tablet by mouth 2 (two) times daily.    [provider]  Cholecalciferol (VITAMIN D) 50 MCG (2000 UT) tablet Take 2,000 Units by mouth daily with breakfast.    [provider]  Cholecalciferol (VITAMIN D-3 PO) Take 1 capsule by mouth in the morning.    [provider]  Cyanocobalamin (VITAMIN B-12 PO) Take 1 tablet by mouth daily.    [provider]  ELIQUIS 2.5 MG TABS tablet TAKE 1 TABLET(2.5 MG) BY MOUTH TWICE DAILY 07/23/20   Turner, Eber , MD  ELIQUIS 2.5 MG TABS tablet Take 2.5 mg by mouth 2 (two) times daily.    [provider]  ferrous sulfate (FEROSUL) 325 (65 FE) MG tablet Take 1 tablet (325 mg  total) by mouth daily with breakfast. 09/03/20   Mansouraty, Telford Nab., MD  ferrous sulfate 325 (65 FE) MG tablet Take 325 mg by mouth daily with breakfast.    [provider]  furosemide (LASIX) 20 MG tablet Take 20 mg by mouth every other day.    [provider]  insulin aspart (NOVOLOG) 100 UNIT/ML injection 0-9 Units, Subcutaneous, 3 times daily with meals CBG < 70: Implement Hypoglycemia measures CBG 70 - 120: 0 units CBG 121 - 150: 1 unit CBG 151 - 200: 2 units CBG 201 - 250: 3 units CBG 251 - 300: 5 units CBG 301 - 350: 7 units CBG 351 - 400: 9 units CBG > 400: call MD 06/21/20   Jonetta Osgood, MD  latanoprost (XALATAN) 0.005 % ophthalmic solution Place 1 drop into the left eye every evening.  05/01/20   [provider]  methimazole (TAPAZOLE) 10 MG tablet Take 5 mg by mouth daily with breakfast.  06/05/20   [provider]  methimazole (TAPAZOLE) 5 MG tablet Take 2.5 mg by mouth in the morning.    [provider]  Multiple Vitamins-Minerals (OCUVITE PRESERVISION PO) Take 1 capsule by mouth daily with breakfast.     [provider]  Multiple Vitamins-Minerals (OCUVITE PRESERVISION PO) Take 1 capsule by mouth in the morning.    [provider]  pantoprazole (PROTONIX) 40 MG tablet TAKE 1 TABLET(40 MG) BY MOUTH DAILY AT 6 AM Patient taking differently: Take 40 mg by mouth daily with breakfast.  03/30/19   Mansouraty, Telford Nab., MD  pantoprazole (PROTONIX) 40 MG tablet Take 40 mg by mouth daily before breakfast.    [provider]  polyethylene glycol (MIRALAX / GLYCOLAX) 17 g packet Take 17 g by mouth daily. 06/21/20   Ghimire, Henreitta Leber, MD  vitamin B-12 (CYANOCOBALAMIN) 1000 MCG tablet Take 1,000 mcg by mouth daily with breakfast.     [provider]    Allergies    Amoxicillin, Celebrex [celecoxib], Amoxicillin, and Celecoxib  Review of Systems   Review of Systems  Unable to perform ROS:  Dementia    Physical Exam Updated Vital Signs BP (!) 149/71   Pulse 66   Temp 97.7 F (36.5 C) (Oral)   Resp 13   Ht 5\' 1"  (1.549 m)   Wt 49.9 kg   SpO2 98%   BMI 20.79 kg/m   Physical Exam Constitutional:      General: She  is not in acute distress.    Appearance: Normal appearance.  HENT:     Head: Normocephalic.     Nose: Nose normal.  Eyes:     Extraocular Movements: Extraocular movements intact.  Cardiovascular:     Rate and Rhythm: Normal rate.  Pulmonary:     Effort: Pulmonary effort is normal.  Musculoskeletal:        General: Normal range of motion.     Cervical back: Normal range of motion.  Neurological:     General: No focal deficit present.     Mental Status: She is alert. Mental status is at baseline.     ED Results / Procedures / Treatments   Labs (all labs ordered are listed, but only abnormal results are displayed) Labs Reviewed  BASIC METABOLIC PANEL - Abnormal; Notable for the following components:      Result Value   Glucose, Bld 204 (*)    Creatinine, Ser 1.84 (*)    Calcium 8.8 (*)    GFR, Estimated 25 (*)    All other components within normal limits  CBC WITH DIFFERENTIAL/PLATELET - Abnormal; Notable for the following components:   WBC 12.6 (*)    Neutro Abs 11.5 (*)    Lymphs Abs 0.3 (*)    Abs Immature Granulocytes 0.13 (*)    All other components within normal limits  URINALYSIS, ROUTINE W REFLEX MICROSCOPIC - Abnormal; Notable for the following components:   Glucose, UA >=500 (*)    Leukocytes,Ua TRACE (*)    All other components within normal limits  LACTIC ACID, PLASMA - Abnormal; Notable for the following components:   Lactic Acid, Venous 2.7 (*)    All other components within normal limits  BRAIN NATRIURETIC PEPTIDE - Abnormal; Notable for the following components:   B Natriuretic Peptide 450.4 (*)    All other components within normal limits  LACTIC ACID, PLASMA  TROPONIN I (HIGH SENSITIVITY)  TROPONIN I (HIGH  SENSITIVITY)    EKG EKG Interpretation  Date/Time:  Friday November 30 2020 20:13:02 EDT Ventricular Rate:  67 PR Interval:    QRS Duration: 157 QT Interval:  492 QTC Calculation: 520 R Axis:   -9 Text Interpretation: Atrial fibrillation Left bundle branch block Confirmed by Thamas Jaegers ([id number]) on 11/30/2020 8:43:45 PM   Radiology CT Head Wo Contrast  Result Date: 11/30/2020 CLINICAL DATA:  Unknown cause, mental status change. EXAM: CT HEAD WITHOUT CONTRAST TECHNIQUE: Contiguous axial images were obtained from the base of the skull through the vertex without intravenous contrast. COMPARISON:  CT head 06/18/2019 FINDINGS: Brain: Cerebral ventricle sizes are concordant with the degree of cerebral volume loss. Patchy and confluent areas of decreased attenuation are noted throughout the deep and periventricular white matter of the cerebral hemispheres bilaterally, compatible with chronic microvascular ischemic disease. Chronic left occipital infarction. No evidence of large-territorial acute infarction. No parenchymal hemorrhage. No mass lesion. No extra-axial collection. No mass effect or midline shift. No hydrocephalus. Basilar cisterns are patent. Vascular: No hyperdense vessel. Atherosclerotic calcifications are present within the cavernous internal carotid arteries. Skull: No acute fracture or focal lesion. Sinuses/Orbits: Paranasal sinuses and mastoid air cells are clear. Bilateral lens replacement. Otherwise orbits are unremarkable. Other: None. IMPRESSION: No acute intracranial abnormality. Electronically Signed   By: Iven Finn M.D.   On: 11/30/2020 22:44   DG Chest Port 1 View  Result Date: 11/30/2020 CLINICAL DATA:  Syncope, unresponsive. EXAM: PORTABLE CHEST 1 VIEW COMPARISON:  Chest x-rays dated 06/17/2020 and 11/09/2018. FINDINGS:  Stable cardiomegaly. Patchy bilateral airspace opacities. No pleural effusion or pneumothorax is seen. Osseous structures about the chest are  unremarkable. Lucency under the LEFT hemidiaphragm, presumably within stomach/bowel but free intraperitoneal air cannot be confidently excluded. IMPRESSION: 1. Patchy bilateral airspace opacities. Differential includes multifocal pneumonia, aspiration and pulmonary edema. 2. Stable cardiomegaly. 3. Lucency under the LEFT hemidiaphragm, presumably within stomach/bowel but free intraperitoneal air cannot be confidently excluded. Recommend further characterization with plain film of the abdomen and/or CT. Electronically Signed   By: Franki Cabot M.D.   On: 11/30/2020 21:24    Procedures Procedures   Medications Ordered in ED Medications - No data to display  ED Course  I have reviewed the triage vital signs and the nursing notes.  Pertinent labs & imaging results that were available during my care of the patient were reviewed by me and considered in my medical decision making (see chart for details).    MDM Rules/Calculators/A&P                          Patient is currently awake and alert looking around.  Following commands.  Patient does not have a clear recollection of prior events.  Labs and imaging otherwise unremarkable.  Is awake at her baseline per son who was at bedside.  I discussed goals of treatment with the son, who states that she is DNR/DNI, he wants to make it comfortable today he does not want admitted to the hospital.  Patient otherwise discharged home in stable condition.  Advising return if she has fevers worsening pain or any additional concerns.   Final Clinical Impression(s) / ED Diagnoses Final diagnoses:  Syncope, unspecified syncope type    Rx / DC Orders ED Discharge Orders    None       Luna Fuse, MD 11/30/20 2304

## 2020-11-30 NOTE — ED Notes (Signed)
LA 2.7 per lab tech. ED MD to be made aware

## 2020-11-30 NOTE — ED Notes (Signed)
[facility] called, long waiting list

## 2020-11-30 NOTE — ED Triage Notes (Signed)
Patient to ED via EMS for complaints of episode of unresponsive. States she lives in an assistant living, when aid came to help with dinner patient in floor unresponsive, CPR initiated, although unsure if patient was without pulse. Narcan administered by EMS, afterwards patient began to respond. At this time no known opioid prescribed.

## 2020-11-30 NOTE — Discharge Instructions (Addendum)
Call your primary care doctor or specialist as discussed in the next 2-3 days.   Return immediately back to the ER if:  Your symptoms worsen within the next 12-24 hours. You develop new symptoms such as new fevers, persistent vomiting, new pain, shortness of breath, or new weakness or numbness, or if you have any other concerns.  

## 2020-12-01 ENCOUNTER — Emergency Department (HOSPITAL_COMMUNITY): Payer: Medicare Other

## 2020-12-01 ENCOUNTER — Inpatient Hospital Stay (HOSPITAL_COMMUNITY)
Admission: EM | Admit: 2020-12-01 | Discharge: 2020-12-05 | DRG: 064 | Disposition: A | Discharge status: Skilled Nursing Facility | Payer: Medicare Other | Attending: Internal Medicine | Admitting: Internal Medicine

## 2020-12-01 ENCOUNTER — Encounter (HOSPITAL_COMMUNITY): Payer: Self-pay | Admitting: Internal Medicine

## 2020-12-01 ENCOUNTER — Observation Stay (HOSPITAL_COMMUNITY): Payer: Medicare Other

## 2020-12-01 ENCOUNTER — Other Ambulatory Visit: Payer: Self-pay

## 2020-12-01 DIAGNOSIS — R471 Dysarthria and anarthria: Secondary | ICD-10-CM | POA: Diagnosis present

## 2020-12-01 DIAGNOSIS — I4819 Other persistent atrial fibrillation: Secondary | ICD-10-CM | POA: Diagnosis present

## 2020-12-01 DIAGNOSIS — I639 Cerebral infarction, unspecified: Secondary | ICD-10-CM | POA: Diagnosis present

## 2020-12-01 DIAGNOSIS — R1310 Dysphagia, unspecified: Secondary | ICD-10-CM

## 2020-12-01 DIAGNOSIS — R1319 Other dysphagia: Secondary | ICD-10-CM | POA: Diagnosis not present

## 2020-12-01 DIAGNOSIS — G9389 Other specified disorders of brain: Secondary | ICD-10-CM | POA: Diagnosis not present

## 2020-12-01 DIAGNOSIS — Z7901 Long term (current) use of anticoagulants: Secondary | ICD-10-CM

## 2020-12-01 DIAGNOSIS — N179 Acute kidney failure, unspecified: Secondary | ICD-10-CM | POA: Diagnosis present

## 2020-12-01 DIAGNOSIS — I959 Hypotension, unspecified: Secondary | ICD-10-CM | POA: Diagnosis not present

## 2020-12-01 DIAGNOSIS — R410 Disorientation, unspecified: Secondary | ICD-10-CM | POA: Diagnosis not present

## 2020-12-01 DIAGNOSIS — N1832 Chronic kidney disease, stage 3b: Secondary | ICD-10-CM | POA: Diagnosis present

## 2020-12-01 DIAGNOSIS — R29707 NIHSS score 7: Secondary | ICD-10-CM | POA: Diagnosis present

## 2020-12-01 DIAGNOSIS — E1122 Type 2 diabetes mellitus with diabetic chronic kidney disease: Secondary | ICD-10-CM | POA: Diagnosis present

## 2020-12-01 DIAGNOSIS — K219 Gastro-esophageal reflux disease without esophagitis: Secondary | ICD-10-CM | POA: Diagnosis present

## 2020-12-01 DIAGNOSIS — E119 Type 2 diabetes mellitus without complications: Secondary | ICD-10-CM

## 2020-12-01 DIAGNOSIS — R001 Bradycardia, unspecified: Secondary | ICD-10-CM | POA: Diagnosis not present

## 2020-12-01 DIAGNOSIS — H9190 Unspecified hearing loss, unspecified ear: Secondary | ICD-10-CM | POA: Diagnosis present

## 2020-12-01 DIAGNOSIS — Z7401 Bed confinement status: Secondary | ICD-10-CM | POA: Diagnosis not present

## 2020-12-01 DIAGNOSIS — Z8042 Family history of malignant neoplasm of prostate: Secondary | ICD-10-CM

## 2020-12-01 DIAGNOSIS — Z79899 Other long term (current) drug therapy: Secondary | ICD-10-CM

## 2020-12-01 DIAGNOSIS — I447 Left bundle-branch block, unspecified: Secondary | ICD-10-CM | POA: Diagnosis present

## 2020-12-01 DIAGNOSIS — I130 Hypertensive heart and chronic kidney disease with heart failure and stage 1 through stage 4 chronic kidney disease, or unspecified chronic kidney disease: Secondary | ICD-10-CM | POA: Diagnosis present

## 2020-12-01 DIAGNOSIS — E039 Hypothyroidism, unspecified: Secondary | ICD-10-CM | POA: Diagnosis present

## 2020-12-01 DIAGNOSIS — I5032 Chronic diastolic (congestive) heart failure: Secondary | ICD-10-CM | POA: Diagnosis present

## 2020-12-01 DIAGNOSIS — Z888 Allergy status to other drugs, medicaments and biological substances status: Secondary | ICD-10-CM

## 2020-12-01 DIAGNOSIS — I4891 Unspecified atrial fibrillation: Secondary | ICD-10-CM | POA: Diagnosis not present

## 2020-12-01 DIAGNOSIS — Z8673 Personal history of transient ischemic attack (TIA), and cerebral infarction without residual deficits: Secondary | ICD-10-CM

## 2020-12-01 DIAGNOSIS — J690 Pneumonitis due to inhalation of food and vomit: Secondary | ICD-10-CM | POA: Diagnosis present

## 2020-12-01 DIAGNOSIS — Z881 Allergy status to other antibiotic agents status: Secondary | ICD-10-CM

## 2020-12-01 DIAGNOSIS — Z20822 Contact with and (suspected) exposure to covid-19: Secondary | ICD-10-CM | POA: Diagnosis present

## 2020-12-01 DIAGNOSIS — R531 Weakness: Secondary | ICD-10-CM | POA: Diagnosis not present

## 2020-12-01 DIAGNOSIS — Z794 Long term (current) use of insulin: Secondary | ICD-10-CM

## 2020-12-01 DIAGNOSIS — R55 Syncope and collapse: Secondary | ICD-10-CM | POA: Diagnosis not present

## 2020-12-01 DIAGNOSIS — Z8249 Family history of ischemic heart disease and other diseases of the circulatory system: Secondary | ICD-10-CM

## 2020-12-01 DIAGNOSIS — A419 Sepsis, unspecified organism: Secondary | ICD-10-CM | POA: Diagnosis not present

## 2020-12-01 DIAGNOSIS — Z833 Family history of diabetes mellitus: Secondary | ICD-10-CM

## 2020-12-01 DIAGNOSIS — J849 Interstitial pulmonary disease, unspecified: Secondary | ICD-10-CM | POA: Diagnosis present

## 2020-12-01 DIAGNOSIS — Z952 Presence of prosthetic heart valve: Secondary | ICD-10-CM

## 2020-12-01 DIAGNOSIS — I10 Essential (primary) hypertension: Secondary | ICD-10-CM | POA: Diagnosis present

## 2020-12-01 DIAGNOSIS — Z66 Do not resuscitate: Secondary | ICD-10-CM | POA: Diagnosis present

## 2020-12-01 DIAGNOSIS — R0902 Hypoxemia: Secondary | ICD-10-CM | POA: Diagnosis not present

## 2020-12-01 DIAGNOSIS — G319 Degenerative disease of nervous system, unspecified: Secondary | ICD-10-CM | POA: Diagnosis not present

## 2020-12-01 DIAGNOSIS — E0590 Thyrotoxicosis, unspecified without thyrotoxic crisis or storm: Secondary | ICD-10-CM | POA: Diagnosis present

## 2020-12-01 DIAGNOSIS — Z9049 Acquired absence of other specified parts of digestive tract: Secondary | ICD-10-CM

## 2020-12-01 DIAGNOSIS — R5383 Other fatigue: Secondary | ICD-10-CM | POA: Diagnosis not present

## 2020-12-01 DIAGNOSIS — N183 Chronic kidney disease, stage 3 unspecified: Secondary | ICD-10-CM | POA: Diagnosis present

## 2020-12-01 DIAGNOSIS — M2550 Pain in unspecified joint: Secondary | ICD-10-CM | POA: Diagnosis not present

## 2020-12-01 DIAGNOSIS — I482 Chronic atrial fibrillation, unspecified: Secondary | ICD-10-CM | POA: Diagnosis present

## 2020-12-01 LAB — COMPREHENSIVE METABOLIC PANEL
ALT: 10 U/L (ref 0–44)
AST: 17 U/L (ref 15–41)
Albumin: 4.3 g/dL (ref 3.5–5.0)
Alkaline Phosphatase: 98 U/L (ref 38–126)
Anion gap: 10 (ref 5–15)
BUN: 29 mg/dL — ABNORMAL HIGH (ref 8–23)
CO2: 29 mmol/L (ref 22–32)
Calcium: 9.2 mg/dL (ref 8.9–10.3)
Chloride: 99 mmol/L (ref 98–111)
Creatinine, Ser: 2.22 mg/dL — ABNORMAL HIGH (ref 0.44–1.00)
GFR, Estimated: 20 mL/min — ABNORMAL LOW (ref >60)
Glucose, Bld: 123 mg/dL — ABNORMAL HIGH (ref 70–99)
Potassium: 4.9 mmol/L (ref 3.5–5.1)
Sodium: 138 mmol/L (ref 135–145)
Total Bilirubin: 1.3 mg/dL — ABNORMAL HIGH (ref 0.3–1.2)
Total Protein: 7.6 g/dL (ref 6.5–8.1)

## 2020-12-01 LAB — RESP PANEL BY RT-PCR (FLU A&B, COVID) ARPGX2
Influenza A by PCR: NEGATIVE
Influenza B by PCR: NEGATIVE
SARS Coronavirus 2 by RT PCR: NEGATIVE

## 2020-12-01 LAB — CBC WITH DIFFERENTIAL/PLATELET
Abs Immature Granulocytes: 0.07 10*3/uL (ref 0.00–0.07)
Basophils Absolute: 0.1 10*3/uL (ref 0.0–0.1)
Basophils Relative: 1 %
Eosinophils Absolute: 0.1 10*3/uL (ref 0.0–0.5)
Eosinophils Relative: 1 %
HCT: 44.7 % (ref 36.0–46.0)
Hemoglobin: 14.2 g/dL (ref 12.0–15.0)
Immature Granulocytes: 1 %
Lymphocytes Relative: 9 %
Lymphs Abs: 1 10*3/uL (ref 0.7–4.0)
MCH: 31.1 pg (ref 26.0–34.0)
MCHC: 31.8 g/dL (ref 30.0–36.0)
MCV: 98 fL (ref 80.0–100.0)
Monocytes Absolute: 0.8 10*3/uL (ref 0.1–1.0)
Monocytes Relative: 7 %
Neutro Abs: 8.7 10*3/uL — ABNORMAL HIGH (ref 1.7–7.7)
Neutrophils Relative %: 81 %
Platelets: 229 10*3/uL (ref 150–400)
RBC: 4.56 MIL/uL (ref 3.87–5.11)
RDW: 15 % (ref 11.5–15.5)
WBC: 10.7 10*3/uL — ABNORMAL HIGH (ref 4.0–10.5)
nRBC: 0 % (ref 0.0–0.2)

## 2020-12-01 LAB — LACTIC ACID, PLASMA
Lactic Acid, Venous: 1.7 mmol/L (ref 0.5–1.9)
Lactic Acid, Venous: 1.4 mmol/L (ref 0.5–1.9)

## 2020-12-01 LAB — APTT: aPTT: 32 seconds (ref 24–36)

## 2020-12-01 LAB — GLUCOSE, CAPILLARY: Glucose-Capillary: 100 mg/dL — ABNORMAL HIGH (ref 70–99)

## 2020-12-01 LAB — PROTIME-INR
INR: 1.2 (ref 0.8–1.2)
Prothrombin Time: 15.6 seconds — ABNORMAL HIGH (ref 11.4–15.2)

## 2020-12-01 IMAGING — MR MR HEAD W/O CM
10 of 11 series · 38 of 48 positions shown · non-contrast
Comparison: Prior head CT from [DATE]

CLINICAL DATA: Initial evaluation for neuro deficit, stroke
suspected.

EXAM:
MRI HEAD WITHOUT CONTRAST
TECHNIQUE: Multiplanar, multiecho pulse sequences of the brain and surrounding
structures were obtained without intravenous contrast.

[Series 5: dwi_tracew · axial · 3.0mm · 1.08mm/px · z∈[-35,+111]mm · 8 of 102 slices shown]
[im 1/102]
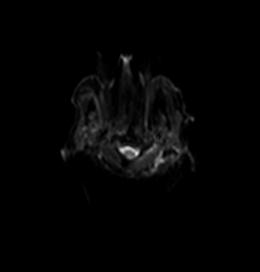
[im 15/102]
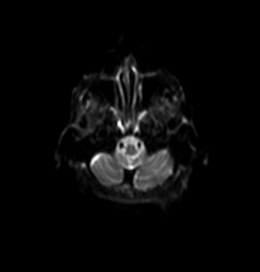
[im 29/102]
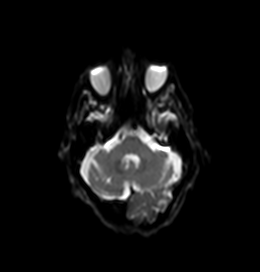
[im 44/102]
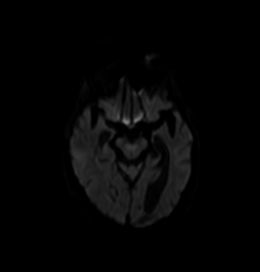
[im 58/102]
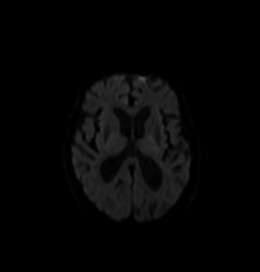
[im 73/102]
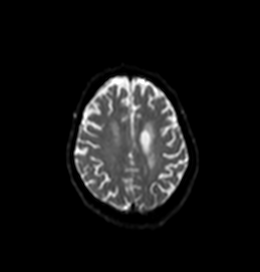
[im 87/102]
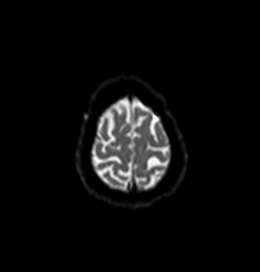
[im 102/102]
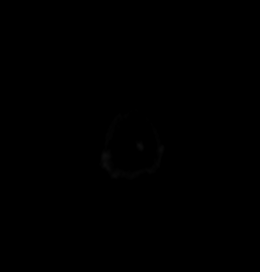

[Series 6: dwi_adc · axial · 3.0mm · 1.08mm/px · z∈[-35,+111]mm · 4 of 51 slices shown]
[im 1/51]
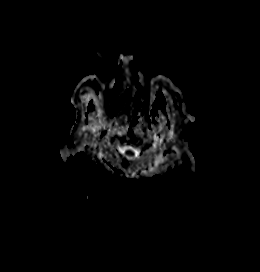
[im 17/51]
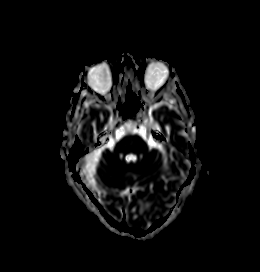
[im 34/51]
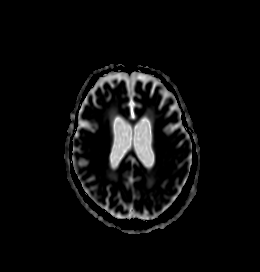
[im 51/51]
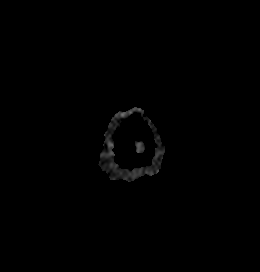

[Series 7: T2 · sagittal · 5.0mm · 0.47mm/px · 2 of 24 slices shown (1 of 3)]
[im 1/24]
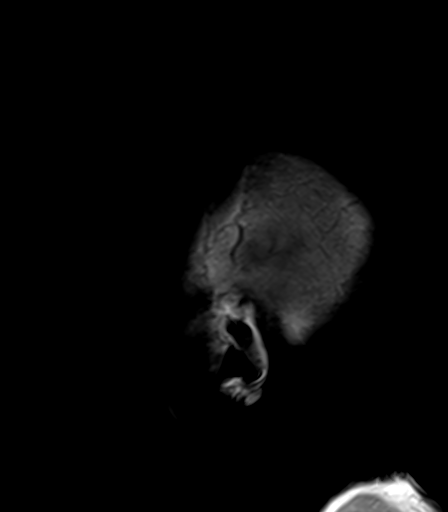
[im 24/24]
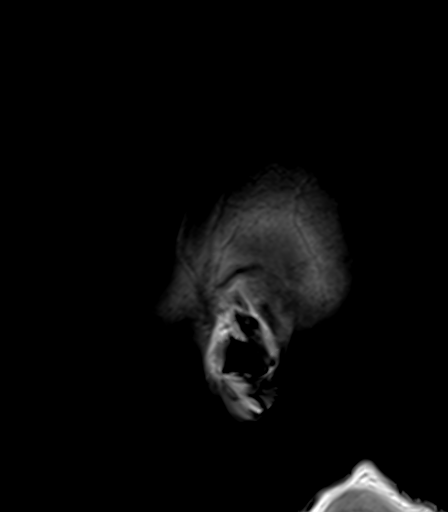

[Series 8: T2 · axial · 5.0mm · 0.45mm/px · z∈[-36,+104]mm · 2 of 23 slices shown (2 of 3)]
[im 1/23]
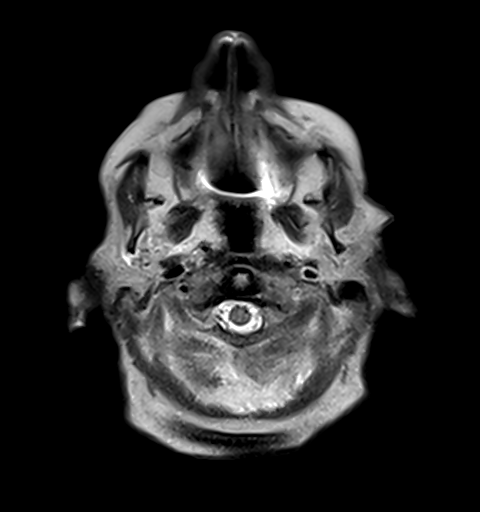
[im 23/23]
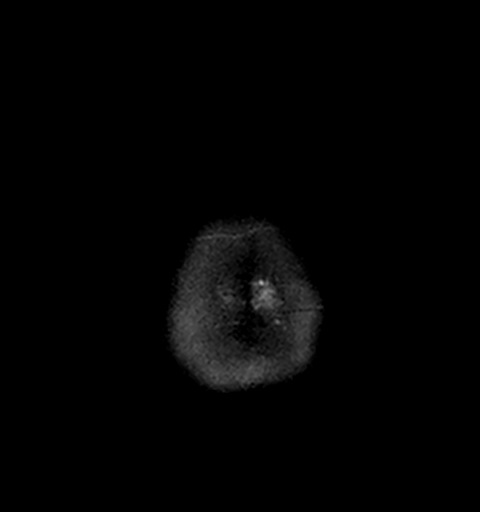

[Series 9: GRE · axial · 3.0mm · 0.45mm/px · z∈[-45,+101]mm · 4 of 51 slices shown]
[im 1/51]
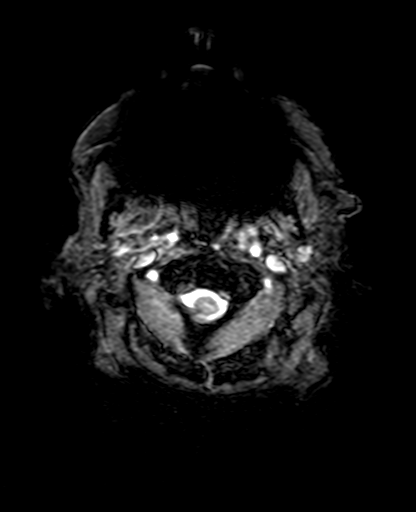
[im 17/51]
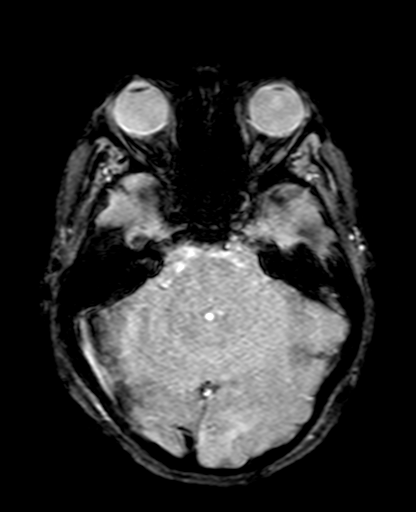
[im 34/51]
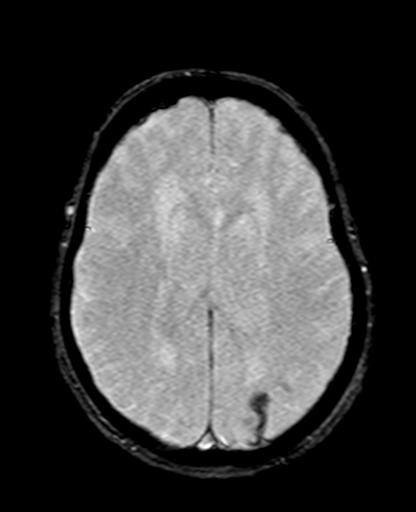
[im 51/51]
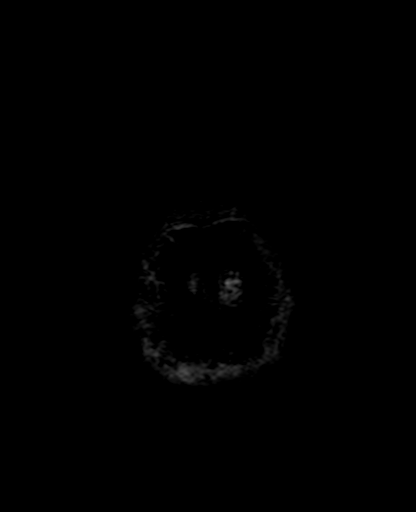

[Series 10: FLAIR · axial · 3.0mm · 0.86mm/px · z∈[-41,+105]mm · 4 of 51 slices shown]
[im 1/51]
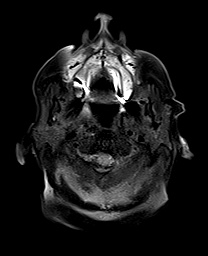
[im 17/51]
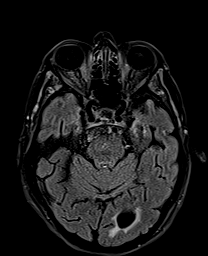
[im 34/51]
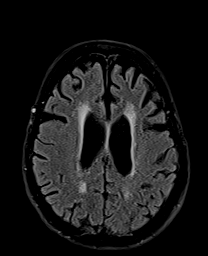
[im 51/51]
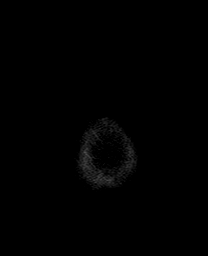

[Series 11: T1 · axial · 3.0mm · 0.45mm/px · z∈[-45,+101]mm · 4 of 51 slices shown]
[im 1/51]
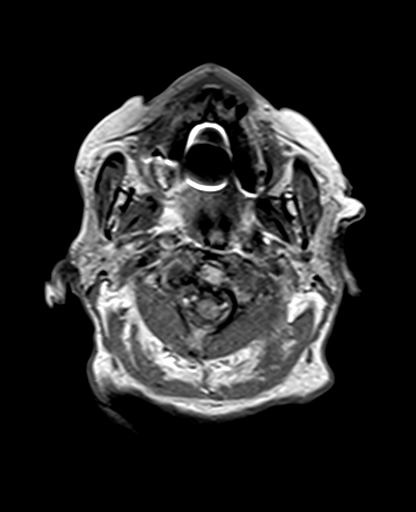
[im 17/51]
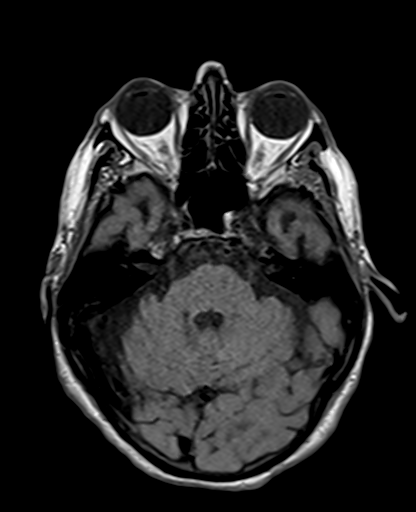
[im 34/51]
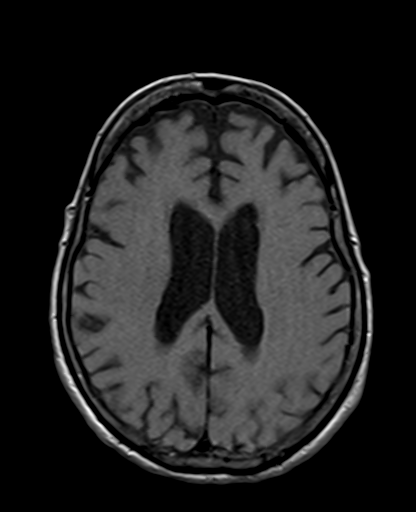
[im 51/51]
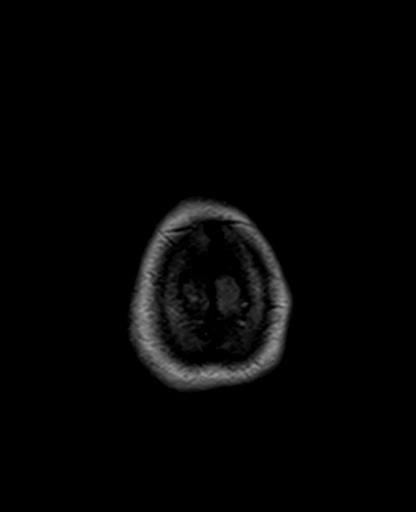

[Series 12: DWI · coronal · 5.0mm · 1.31mm/px · 5 of 64 slices shown (1 of 2)]
[im 1/64]
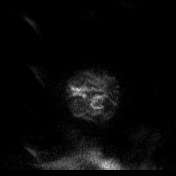
[im 16/64]
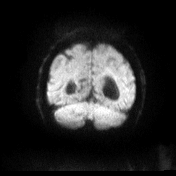
[im 32/64]
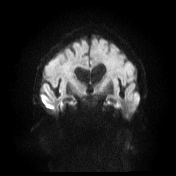
[im 48/64]
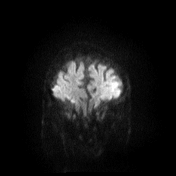
[im 64/64]
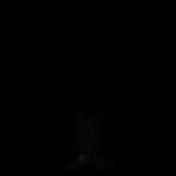

[Series 13: DWI · coronal · 5.0mm · 1.31mm/px · 3 of 32 slices shown (2 of 2)]
[im 1/32]
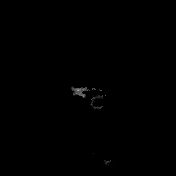
[im 16/32]
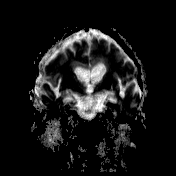
[im 32/32]
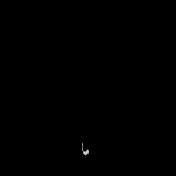

[Series 14: T2 · coronal · 5.0mm · 0.86mm/px · 2 of 30 slices shown (3 of 3)]
[im 1/30]
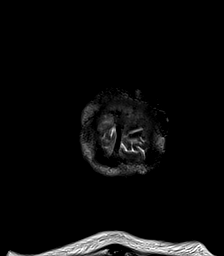
[im 30/30]
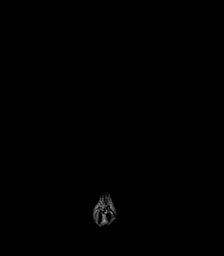

[38 of 48 positions shown; findings below may reference images not displayed]

FINDINGS: Brain: Diffuse prominence of the CSF containing spaces compatible
with generalized cerebral atrophy, most pronounced at the anterior
temporal lobes bilaterally. Patchy and confluent T2/FLAIR
hyperintensity within the periventricular and deep white matter both
cerebral hemispheres as well as the pons, most consistent with
chronic small vessel ischemic disease, mild for age. Area of
encephalomalacia and gliosis with associated chronic hemosiderin
staining at the left parieto-occipital region consistent with a
chronic ischemic infarct. Additional few scattered small remote
bilateral cerebellar infarcts noted as well. Associated chronic
hemosiderin staining noted at the inferior left cerebellum as well.
Small remote lacunar infarcts at the right basal ganglia and left
thalamus.

7 mm focus of restricted diffusion seen involving the right parietal
cortex, postcentral gyrus (series 5, image 92). An additional subtle
punctate focus of diffusion abnormality noted slightly superiorly at
the right parietal cortex as well (series 5, image 95). Findings
consistent with small acute ischemic infarcts, likely embolic. No
associated hemorrhage or mass effect.

No other evidence for acute or subacute ischemia. Gray-white matter
differentiation otherwise maintained. No other areas of remote
cortical infarction. No other evidence for acute or chronic
intracranial hemorrhage.

No mass lesion, midline shift or mass effect. Mild ventricular
prominence related to global parenchymal volume loss without
hydrocephalus. No extra-axial fluid collection.

Note made of an empty sella.  Midline structures intact.

Vascular: Major intracranial vascular flow voids are maintained.

Skull and upper cervical spine: Craniocervical junction within
normal limits. Bone marrow signal intensity normal. No focal marrow
replacing lesion. No scalp soft tissue abnormality.

Sinuses/Orbits: Patient status post bilateral ocular lens
replacement. Globes and orbital soft tissues demonstrate no acute
finding. Paranasal sinuses are clear. No significant mastoid
effusion.

Other: None.
IMPRESSION: 1. Few subcentimeter ischemic infarcts measuring up to 7 mm
involving the high posterior right parietal cortex as above. No
associated hemorrhage or mass effect.
2. No other acute intracranial abnormality.
3. Underlying age-related cerebral atrophy with mild chronic small
vessel ischemic disease, with multiple remote lacunar infarcts as
detailed above.

## 2020-12-01 IMAGING — CR DG NECK SOFT TISSUE
2 series · 2 of 2 positions shown · non-contrast
Comparison: None.

CLINICAL DATA: Difficulty swallowing, lethargy

EXAM:
NECK SOFT TISSUES - 1+ VIEW

[w soft tissue neck lat]
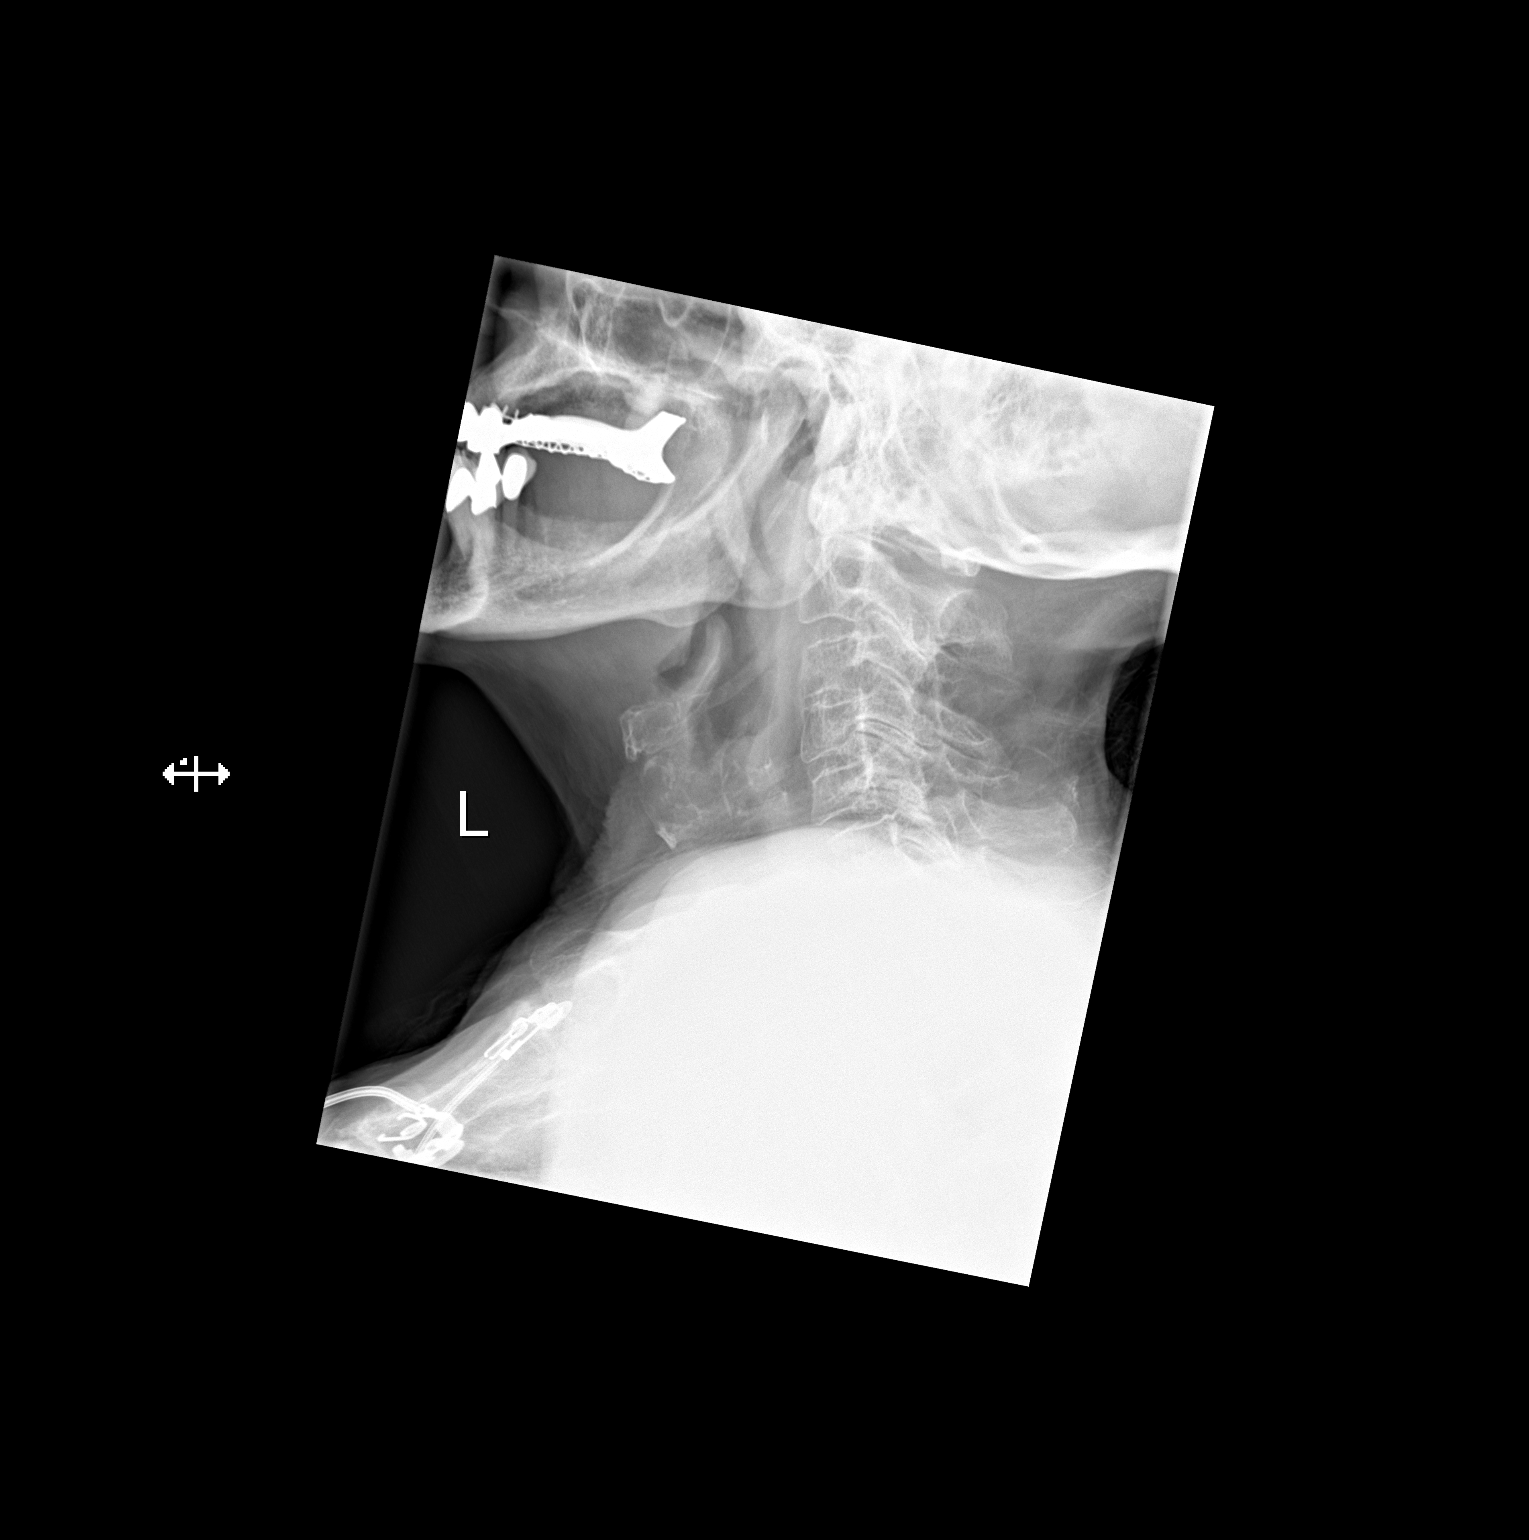

[x soft tissue neck ap]
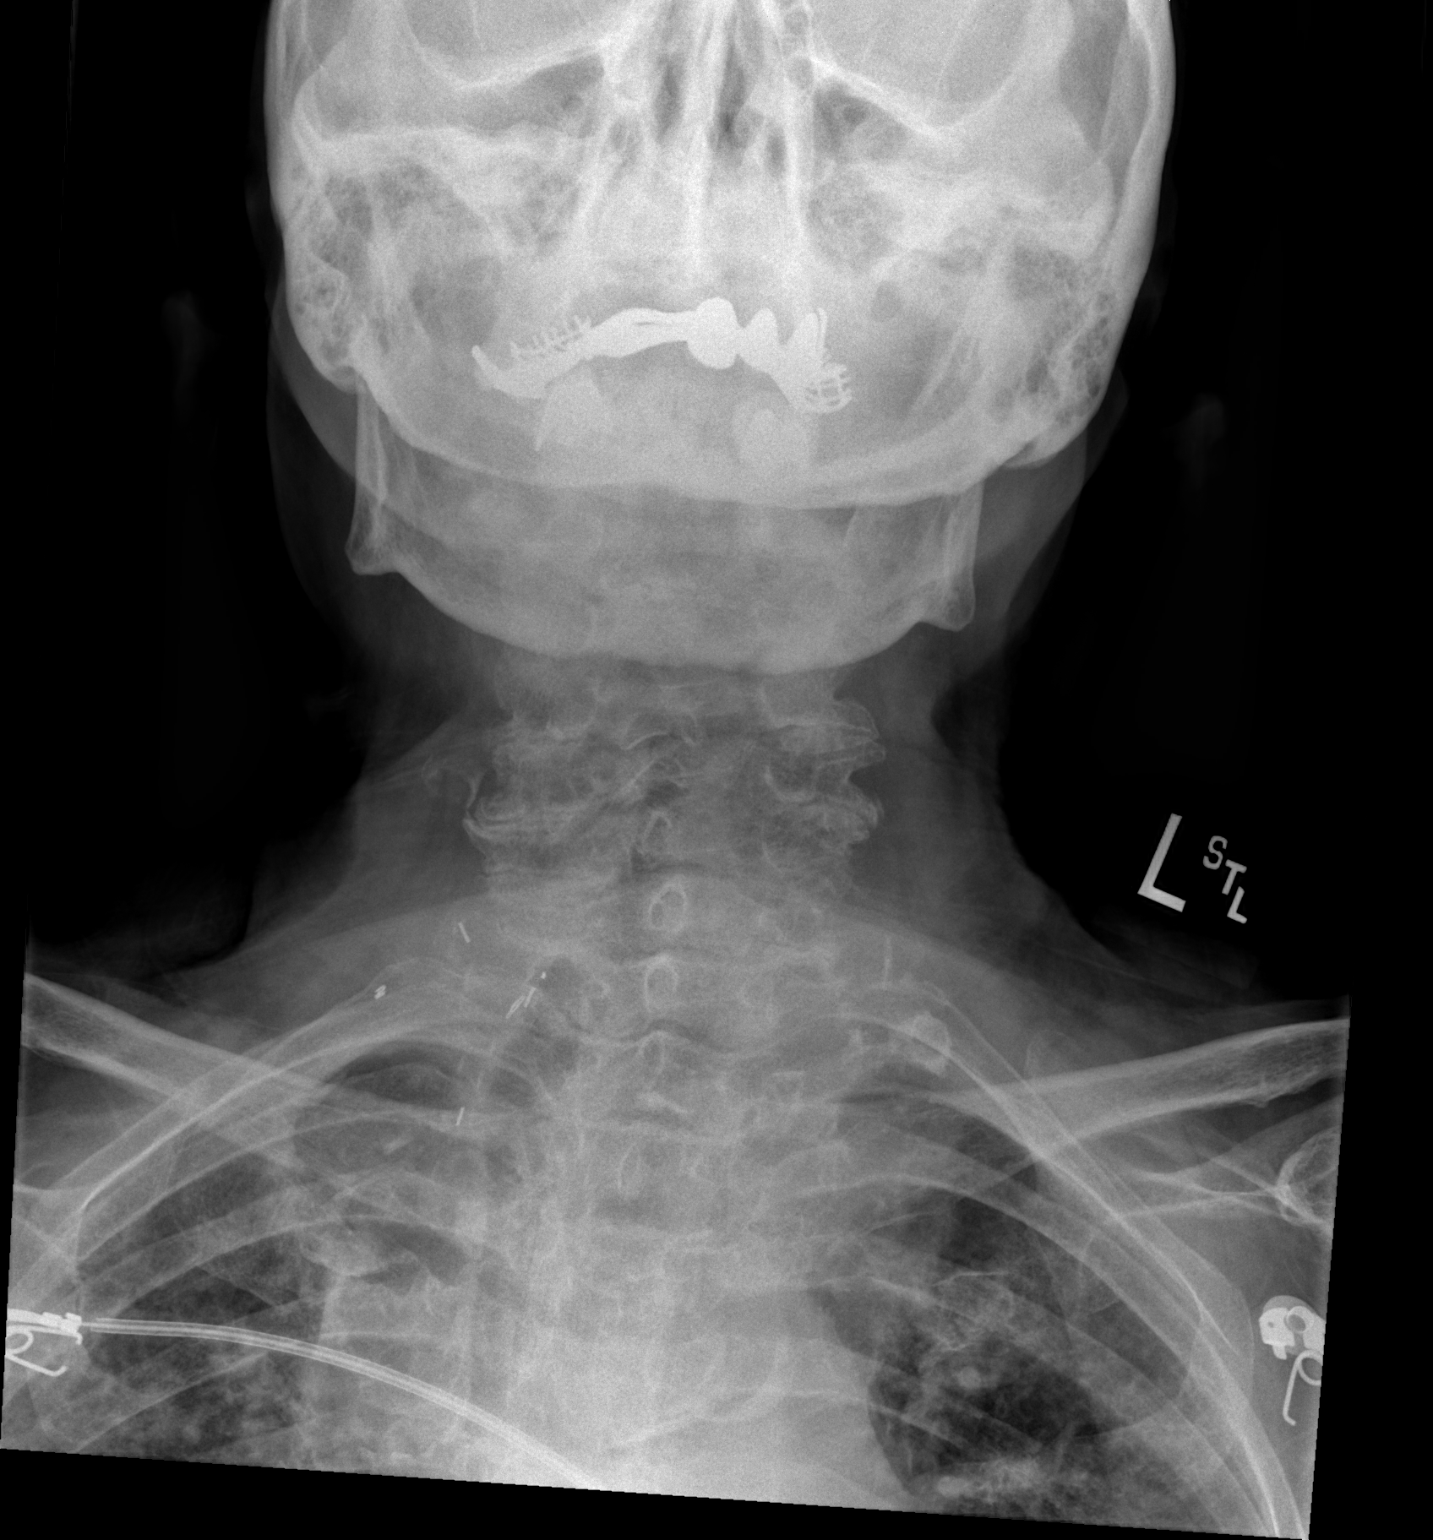

[2 of 2 positions shown; findings below may reference images not displayed]

FINDINGS: Frontal and lateral views of the soft tissues of the neck are
obtained. Prevertebral soft tissues appear unremarkable. Epiglottis
is normal. The airway is patent. Surgical clips are seen in the
right supraclavicular region. No acute bony abnormalities.
Multilevel cervical facet hypertrophy. Mild anterolisthesis of C5 on
C6 likely due to degenerative changes.
IMPRESSION: 1. Unremarkable soft tissues of the neck.
2. Multilevel cervical degenerative changes.

## 2020-12-01 IMAGING — CR DG CHEST 1V PORT
1 series · 1 of 1 positions shown · non-contrast
Comparison: [DATE] at [DATE] p.m.

CLINICAL DATA: Sepsis, difficulty swallowing, lethargy

EXAM:
PORTABLE CHEST 1 VIEW

[x chest ap]
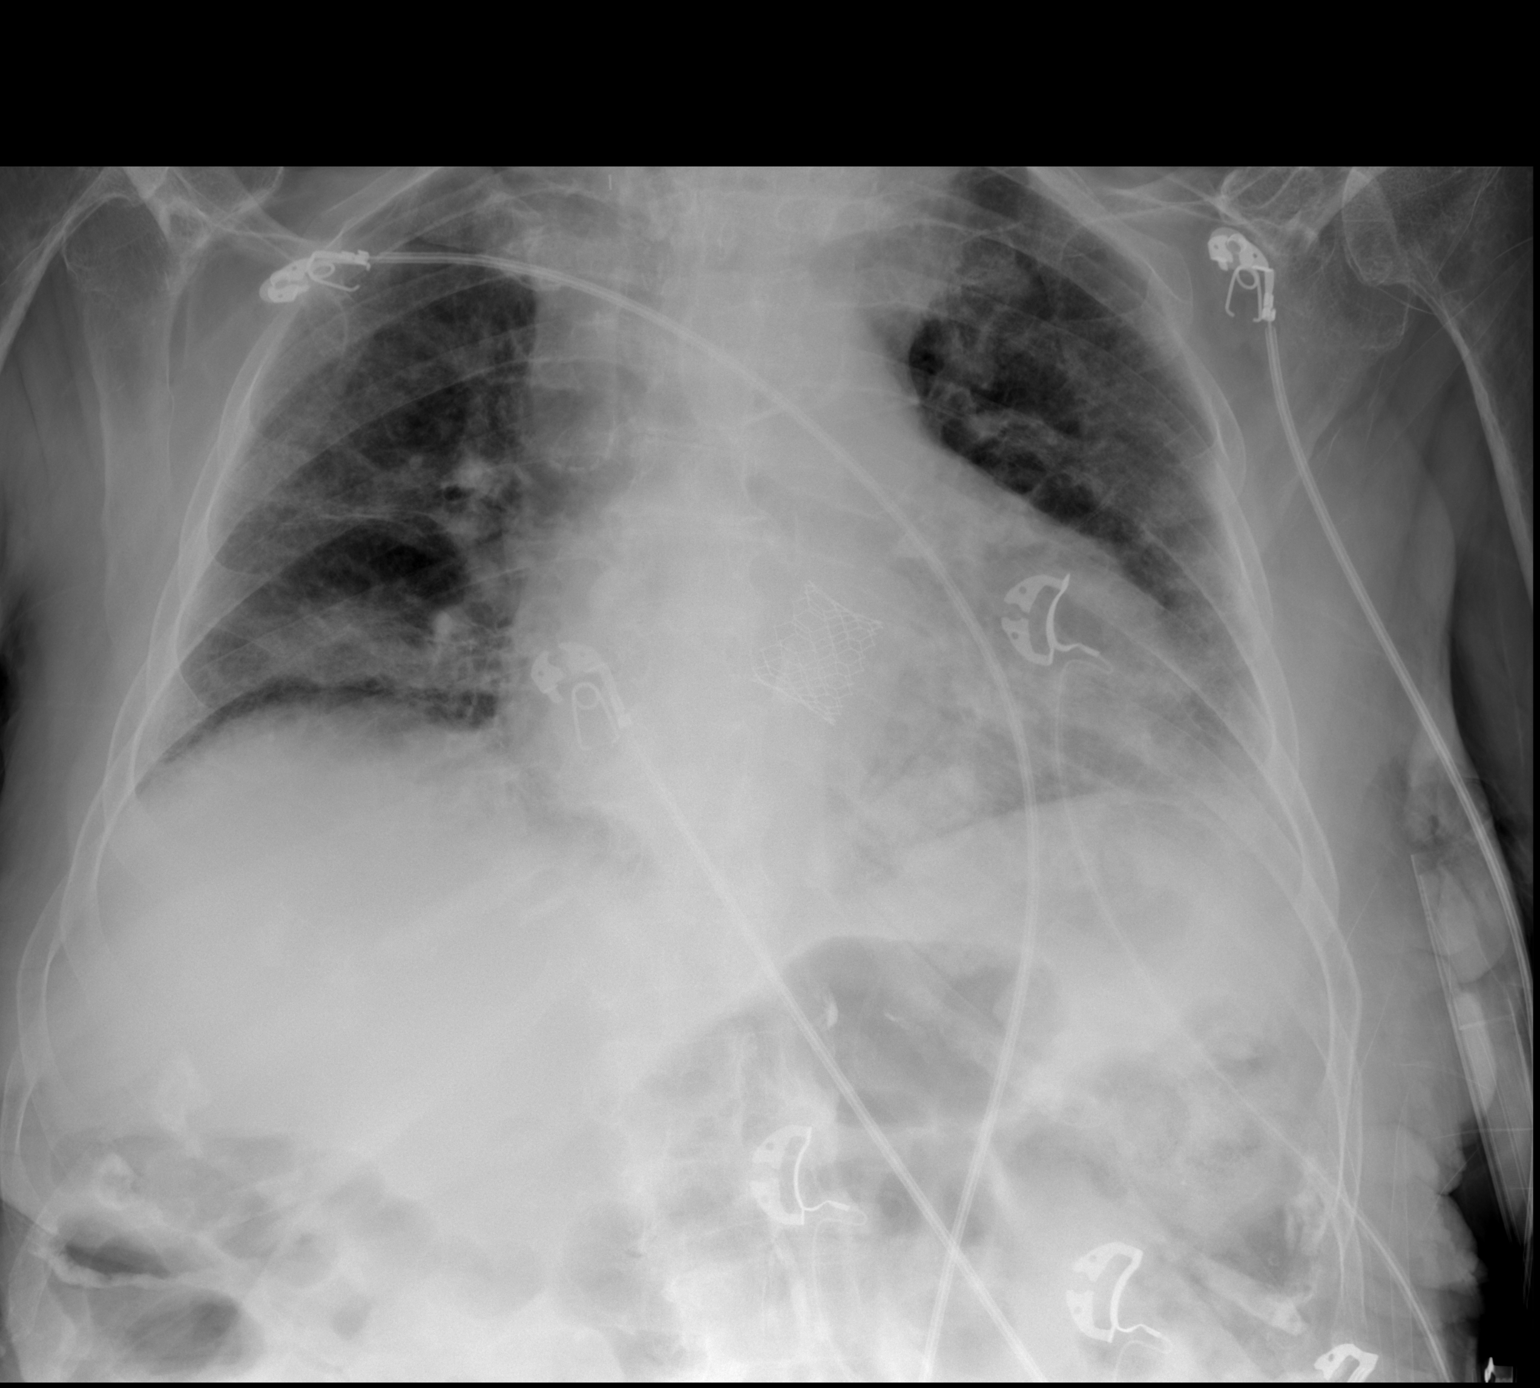

[1 of 1 positions shown; findings below may reference images not displayed]

FINDINGS: Single frontal view of the chest demonstrates a stable cardiac
silhouette with aortic valve prosthesis. Patchy consolidation
primarily at the lung bases unchanged since previous exam. No large
effusion or pneumothorax. No acute bony abnormality.
IMPRESSION: 1. Stable patchy bilateral airspace disease which may reflect edema
or infection.

## 2020-12-01 MED ORDER — SODIUM CHLORIDE 0.9 % IV SOLN: INTRAVENOUS | Status: AC

## 2020-12-01 MED ORDER — HEPARIN (PORCINE) 25000 UT/250ML-% IV SOLN
600.0000 [IU]/h | INTRAVENOUS | Status: DC
  Administered 2020-12-02: 600 [IU]/h via INTRAVENOUS
  Filled 2020-12-01: qty 250

## 2020-12-01 MED ORDER — HEPARIN BOLUS VIA INFUSION
2000.0000 [IU] | Freq: Once | INTRAVENOUS | Status: AC
  Administered 2020-12-02: 2000 [IU] via INTRAVENOUS
  Filled 2020-12-01: qty 2000

## 2020-12-01 MED ORDER — SODIUM CHLORIDE 0.9 % IV BOLUS
1000.0000 mL | Freq: Once | INTRAVENOUS | Status: AC
  Administered 2020-12-01: 1000 mL via INTRAVENOUS

## 2020-12-01 MED ORDER — ACETAMINOPHEN 325 MG PO TABS
325.0000 mg | ORAL_TABLET | Freq: Once | ORAL | Status: DC
  Filled 2020-12-01: qty 1

## 2020-12-01 MED ORDER — INSULIN ASPART 100 UNIT/ML IJ SOLN
0.0000 [IU] | INTRAMUSCULAR | Status: DC
  Administered 2020-12-03 – 2020-12-05 (×5): 1 [IU] via SUBCUTANEOUS
  Filled 2020-12-01: qty 0.06

## 2020-12-01 NOTE — ED Notes (Signed)
Set pt up on purewick to obtain urine sample

## 2020-12-01 NOTE — H&P (Addendum)
History and Physical    Alexa Mcdonald NFA:[id number] DOB: 25-Feb-1921 DOA: 12/01/2020  PCP: Jonathon Jordan, MD  Patient coming from: [facility].  Chief Complaint: Difficulty swallowing.  History obtained from patient's son.  HPI: Alexa Mcdonald is a 87 y.o. female with history of chronic A. fib, diabetes mellitus, hyperthyroidism was brought to the ER after patient had difficulty swallowing.  Patient was brought to the ER earlier yesterday for unresponsive episode and was discharged back to facility after observation and family did not want patient to be admitted.  Per patient's son who provided the history patient also become more weak generally.  ED Course: In the ER CT head was unremarkable 6 x-ray showing possible pneumonia EKG shows A. fib.  Labs show mild leukocytosis of 10.7 with creatinine significantly increased from 1.4 in April 20 it is around 2.2.  Lactic acid was normal.  Patient admitted for further work-up of dysphagia/odynophagia unknown cause.  Review of Systems: As per HPI, rest all negative.   Past Medical History:  Diagnosis Date  . 1st degree AV block   . Bradycardia   . Chronic diastolic heart failure ([place]) 02/07/2016  . CKD (chronic kidney disease), stage III ([place])   . Colon, diverticulosis   . Diabetes mellitus    diet controlled  . Essential hypertension   . GERD (gastroesophageal reflux disease)   . Hematochezia   . HOH (hard of hearing)    wears bilateral hearing aids  . Hx of orthostatic hypotension   . Interstitial lung disease ([place])   . LBBB (left bundle branch block)   . Long term current use of anticoagulant therapy   . Mediastinal goiter     recurrent s/p resection 1988  . Mild CAD   . Mitral regurgitation   . Osteopenia   . Osteoporosis   . Paroxysmal atrial fibrillation (HCC)    a. CHA2DS2VASc = 5-->coumadin;  b. previously on amio->d/c'd 11/2014 secondary to concern for possible amio lung.  . Pneumonia   . S/P TAVR  (transcatheter aortic valve 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   23 mm Edwards Sapien 3 transcatheter heart valve placed via percutaneous right transfemoral approach  . Severe aortic stenosis    s/p TAVR with stable prosthesis by echo 08/2019 - mean AVG 7mmHg  . Stroke ([place])   . Tachycardia-bradycardia ([place])   . Vitamin D deficiency     Past Surgical History:  Procedure Laterality Date  . BILIARY DILATION  10/24/2018   Procedure: BILIARY DILATION;  Surgeon: Jackquline Denmark, MD;  Location: [facility] ENDOSCOPY;  Service: Endoscopy;;  . BIOPSY  11/11/2018   Procedure: BIOPSY;  Surgeon: Irving Copas., MD;  Location: [facility] ENDOSCOPY;  Service: Gastroenterology;;  . CARDIAC CATHETERIZATION N/A 10/31/2015   Procedure: Right/Left Heart Cath and Coronary Angiography;  Surgeon: [name], MD;  Location: [facility] CV LAB;  Service: Cardiovascular;  Laterality: N/A;  . CARPAL TUNNEL RELEASE Bilateral   . CATARACT EXTRACTION Bilateral   . CESAREAN SECTION     x3  . CHOLECYSTECTOMY N/A 10/25/2018   Procedure: LAPAROSCOPIC CHOLECYSTECTOMY WITH INTRAOPERATIVE CHOLANGIOGRAM;  Surgeon: Kieth Brightly Arta Bruce, MD;  Location: [facility] ORS;  Service: General;  Laterality: N/A;  . ERCP N/A 10/24/2018   Procedure: ENDOSCOPIC RETROGRADE CHOLANGIOPANCREATOGRAPHY (ERCP);  Surgeon: Jackquline Denmark, MD;  Location: [facility];  Service: Endoscopy;  Laterality: N/A;  . ESOPHAGOGASTRODUODENOSCOPY (EGD) WITH PROPOFOL N/A 11/11/2018   Procedure: ESOPHAGOGASTRODUODENOSCOPY (EGD) WITH PROPOFOL;  Surgeon: Rush Landmark Telford Nab., MD;  Location: [facility];  Service:  Gastroenterology;  Laterality: N/A;  . mediastinal removal of a goiter    . MRI  08/18/07   head (diabetes heart study at [facility])  . REMOVAL OF STONES  10/24/2018   Procedure: REMOVAL OF STONES;  Surgeon: Jackquline Denmark, MD;  Location: [facility];  Service: Endoscopy;;  . Joan Mayans  10/24/2018   Procedure: Joan Mayans;  Surgeon: Jackquline Denmark, MD;  Location: [facility];   Service: Endoscopy;;  . TEE WITHOUT CARDIOVERSION N/A 11/27/2015   Procedure: TRANSESOPHAGEAL ECHOCARDIOGRAM (TEE);  Surgeon: [name], MD;  Location: [facility];  Service: Open Heart Surgery;  Laterality: N/A;  . TRANSCATHETER AORTIC VALVE REPLACEMENT, TRANSFEMORAL N/A 11/27/2015   Procedure: TRANSCATHETER AORTIC VALVE REPLACEMENT, TRANSFEMORAL;  Surgeon: [name], MD;  Location: [facility];  Service: Open Heart Surgery;  Laterality: N/A;     reports that she has never smoked. She has never used smokeless tobacco. She reports previous alcohol use. She reports that she does not use drugs.  Allergies  Allergen Reactions  . Amoxicillin Other (See Comments)    tongue felt bruised Did it involve swelling of the face/tongue/throat, SOB, or low BP? No Did it involve sudden or severe rash/hives, skin peeling, or any reaction on the inside of your mouth or nose? No Did you need to seek medical attention at a hospital or doctor's office? No When did it last happen?childhood If all above answers are "NO", may proceed with cephalosporin use.    . Celebrex [Celecoxib] Other (See Comments)    Mouth sores  . Amoxicillin Other (See Comments)    "Made my tongue felt bruised"  . Celecoxib Other (See Comments)    Made the mouth sore    Family History  Problem Relation Age of Onset  . Diabetes Mother   . Cancer - Prostate Father   . Pulmonary embolism Father   . Hypertension Sister   . Diabetes Sister   . Hypertension Brother   . Arrhythmia Brother        afib  . CAD Brother   . Pulmonary fibrosis Brother   . Colon cancer Neg Hx   . Esophageal cancer Neg Hx   . Inflammatory bowel disease Neg Hx   . Liver disease Neg Hx   . Pancreatic cancer Neg Hx   . Stomach cancer Neg Hx   . Rectal cancer Neg Hx     Prior to Admission medications   Medication Sig Start Date End Date Taking? Authorizing Provider  acetaminophen (TYLENOL) 500 MG tablet Take 1-2 tablets (500-1,000 mg total) by  mouth every 6 (six) hours as needed for mild pain or moderate pain (or headaches). 11/21/20  Yes Domenic Moras, PA-C  atorvastatin (LIPITOR) 10 MG tablet Take 10 mg by mouth daily.   Yes [provider]  Cyanocobalamin (VITAMIN B-12 PO) Take 1 tablet by mouth daily.   Yes [provider]  ELIQUIS 2.5 MG TABS tablet TAKE 1 TABLET(2.5 MG) BY MOUTH TWICE DAILY 07/23/20  Yes Turner, Traci R, MD  furosemide (LASIX) 20 MG tablet Take 20 mg by mouth every other day.   Yes [provider]  methimazole (TAPAZOLE) 5 MG tablet Take 2.5 mg by mouth in the morning.   Yes [provider]  Multiple Vitamins-Minerals (OCUVITE PRESERVISION PO) Take 1 capsule by mouth in the morning.   Yes [provider]  pantoprazole (PROTONIX) 40 MG tablet TAKE 1 TABLET(40 MG) BY MOUTH DAILY AT 6 AM Patient taking differently: Take 40 mg by mouth daily with breakfast.  03/30/19  Yes Mansouraty, Telford Nab., MD  vitamin B-12 (CYANOCOBALAMIN) 1000 MCG tablet Take 1,000 mcg by mouth daily with breakfast.    Yes [provider]  acetaminophen (TYLENOL) 325 MG tablet Take 2 tablets (650 mg total) by mouth every 6 (six) hours as needed for mild pain. Patient not taking: No sig reported 10/26/18   Swayze, Ava, DO  ferrous sulfate (FEROSUL) 325 (65 FE) MG tablet Take 1 tablet (325 mg total) by mouth daily with breakfast. Patient not taking: Reported on 12/01/2020 09/03/20   Mansouraty, Telford Nab., MD  insulin aspart (NOVOLOG) 100 UNIT/ML injection 0-9 Units, Subcutaneous, 3 times daily with meals CBG < 70: Implement Hypoglycemia measures CBG 70 - 120: 0 units CBG 121 - 150: 1 unit CBG 151 - 200: 2 units CBG 201 - 250: 3 units CBG 251 - 300: 5 units CBG 301 - 350: 7 units CBG 351 - 400: 9 units CBG > 400: call MD Patient not taking: No sig reported 06/21/20   Ghimire, Henreitta Leber, MD  polyethylene glycol (MIRALAX / GLYCOLAX) 17 g packet Take 17 g by mouth daily. Patient not taking: No  sig reported 06/21/20   Jonetta Osgood, MD    Physical Exam: Constitutional: Moderately built and nourished. Vitals:   12/01/20 1930 12/01/20 2000 12/01/20 2030 12/01/20 2035  BP:  128/62 (!) 138/58   Pulse: (!) 56 (!) 56 (!) 55   Resp: 16 10 (!) 9 14  Temp:      TempSrc:      SpO2: 93% 94% 95%   Weight:      Height:       Eyes: Anicteric no pallor. ENMT: No discharge from the ears eyes nose and mouth. Neck: No mass felt.  No neck rigidity. Respiratory: No rhonchi or crepitations. Cardiovascular: S1-S2 heard. Abdomen: Soft nontender bowel sounds present. Musculoskeletal: No edema. Skin: No rash. Neurologic: Alert awake oriented to name moving all extremities but difficult to assess exact strength of the extremities.  Upper extremities are able to be raised against gravity lower extremities roughly around 3 x 5 in strength.  No facial asymmetry pupils equal and reacting to light. Psychiatric: Appears mildly confused.   Labs on Admission: I have personally reviewed following labs and imaging studies  CBC: Recent Labs  Lab 11/30/20 2032 12/01/20 1851  WBC 12.6* 10.7*  NEUTROABS 11.5* 8.7*  HGB 13.1 14.2  HCT 40.3 44.7  MCV 96.2 98.0  PLT 211 702   Basic Metabolic Panel: Recent Labs  Lab 11/30/20 2032 12/01/20 1851  NA 135 138  K 4.7 4.9  CL 99 99  CO2 27 29  GLUCOSE 204* 123*  BUN 21 29*  CREATININE 1.84* 2.22*  CALCIUM 8.8* 9.2   GFR: Estimated Creatinine Clearance: 10.9 mL/min (A) (by C-G formula based on SCr of 2.22 mg/dL (H)). Liver Function Tests: Recent Labs  Lab 12/01/20 1851  AST 17  ALT 10  ALKPHOS 98  BILITOT 1.3*  PROT 7.6  ALBUMIN 4.3   No results for input(s): LIPASE, AMYLASE in the last 168 hours. No results for input(s): AMMONIA in the last 168 hours. Coagulation Profile: Recent Labs  Lab 12/01/20 1851  INR 1.2   Cardiac Enzymes: No results for input(s): CKTOTAL, CKMB, CKMBINDEX, TROPONINI in the last 168 hours. BNP (last  3 results) No results for input(s): PROBNP in the last 8760 hours. HbA1C: No results for input(s): HGBA1C in the last 72 hours. CBG: No results for input(s): GLUCAP in the  last 168 hours. Lipid Profile: No results for input(s): CHOL, HDL, LDLCALC, TRIG, CHOLHDL, LDLDIRECT in the last 72 hours. Thyroid Function Tests: No results for input(s): TSH, T4TOTAL, FREET4, T3FREE, THYROIDAB in the last 72 hours. Anemia Panel: No results for input(s): VITAMINB12, FOLATE, FERRITIN, TIBC, IRON, RETICCTPCT in the last 72 hours. Urine analysis:    Component Value Date/Time   COLORURINE YELLOW 11/30/2020 2248   APPEARANCEUR CLEAR 11/30/2020 2248   LABSPEC 1.012 11/30/2020 2248   PHURINE 5.0 11/30/2020 2248   GLUCOSEU >=500 (A) 11/30/2020 2248   HGBUR NEGATIVE 11/30/2020 2248   BILIRUBINUR NEGATIVE 11/30/2020 2248   KETONESUR NEGATIVE 11/30/2020 2248   PROTEINUR NEGATIVE 11/30/2020 2248   NITRITE NEGATIVE 11/30/2020 2248   LEUKOCYTESUR TRACE (A) 11/30/2020 2248   Sepsis Labs: @LABRCNTIP (procalcitonin:4,lacticidven:4) ) Recent Results (from the past 240 hour(s))  Resp Panel by RT-PCR (Flu A&B, Covid) Nasopharyngeal Swab     Status: None   Collection Time: 12/01/20  8:36 PM   Specimen: Nasopharyngeal Swab; Nasopharyngeal(NP) swabs in vial transport medium  Result Value Ref Range Status   SARS Coronavirus 2 by RT PCR NEGATIVE NEGATIVE Final    Comment: (NOTE) SARS-CoV-2 target nucleic acids are NOT DETECTED.  The SARS-CoV-2 RNA is generally detectable in upper respiratory specimens during the acute phase of infection. The lowest concentration of SARS-CoV-2 viral copies this assay can detect is 138 copies/mL. A negative result does not preclude SARS-Cov-2 infection and should not be used as the sole basis for treatment or other patient management decisions. A negative result may occur with  improper specimen collection/handling, submission of specimen other than nasopharyngeal swab, presence  of viral mutation(s) within the areas targeted by this assay, and inadequate number of viral copies(<138 copies/mL). A negative result must be combined with clinical observations, patient history, and epidemiological information. The expected result is Negative.  Fact Sheet for Patients:  EntrepreneurPulse.com.au  Fact Sheet for Healthcare Providers:  IncredibleEmployment.be  This test is no t yet approved or cleared by the [place] FDA and  has been authorized for detection and/or diagnosis of SARS-CoV-2 by FDA under an Emergency Use Authorization (EUA). This EUA will remain  in effect (meaning this test can be used) for the duration of the COVID-19 declaration under Section 564(b)(1) of the Act, 21 U.S.C.section 360bbb-3(b)(1), unless the authorization is terminated  or revoked sooner.       Influenza A by PCR NEGATIVE NEGATIVE Final   Influenza B by PCR NEGATIVE NEGATIVE Final    Comment: (NOTE) The Xpert Xpress SARS-CoV-2/FLU/RSV plus assay is intended as an aid in the diagnosis of influenza from Nasopharyngeal swab specimens and should not be used as a sole basis for treatment. Nasal washings and aspirates are unacceptable for Xpert Xpress SARS-CoV-2/FLU/RSV testing.  Fact Sheet for Patients: EntrepreneurPulse.com.au  Fact Sheet for Healthcare Providers: IncredibleEmployment.be  This test is not yet approved or cleared by the [place] FDA and has been authorized for detection and/or diagnosis of SARS-CoV-2 by FDA under an Emergency Use Authorization (EUA). This EUA will remain in effect (meaning this test can be used) for the duration of the COVID-19 declaration under Section 564(b)(1) of the Act, 21 U.S.C. section 360bbb-3(b)(1), unless the authorization is terminated or revoked.  Performed at [facility], [place] [address], [place], [place] [address]       Radiological Exams on Admission: DG Neck Soft Tissue  Result Date: 12/01/2020 CLINICAL DATA:  Difficulty swallowing, lethargy EXAM: NECK SOFT TISSUES - 1+ VIEW COMPARISON:  None. FINDINGS: Frontal and lateral views of the soft tissues of the neck are obtained. Prevertebral soft tissues appear unremarkable. Epiglottis is normal. The airway is patent. Surgical clips are seen in the right supraclavicular region. No acute bony abnormalities. Multilevel cervical facet hypertrophy. Mild anterolisthesis of C5 on C6 likely due to degenerative changes. IMPRESSION: 1. Unremarkable soft tissues of the neck. 2. Multilevel cervical degenerative changes. Electronically Signed   By: Randa Ngo M.D.   On: 12/01/2020 20:03   CT Head Wo Contrast  Result Date: 11/30/2020 CLINICAL DATA:  Unknown cause, mental status change. EXAM: CT HEAD WITHOUT CONTRAST TECHNIQUE: Contiguous axial images were obtained from the base of the skull through the vertex without intravenous contrast. COMPARISON:  CT head 06/18/2019 FINDINGS: Brain: Cerebral ventricle sizes are concordant with the degree of cerebral volume loss. Patchy and confluent areas of decreased attenuation are noted throughout the deep and periventricular white matter of the cerebral hemispheres bilaterally, compatible with chronic microvascular ischemic disease. Chronic left occipital infarction. No evidence of large-territorial acute infarction. No parenchymal hemorrhage. No mass lesion. No extra-axial collection. No mass effect or midline shift. No hydrocephalus. Basilar cisterns are patent. Vascular: No hyperdense vessel. Atherosclerotic calcifications are present within the cavernous internal carotid arteries. Skull: No acute fracture or focal lesion. Sinuses/Orbits: Paranasal sinuses and mastoid air cells are clear. Bilateral lens replacement. Otherwise orbits are unremarkable. Other: None. IMPRESSION: No acute intracranial abnormality. Electronically Signed   By:  Iven Finn M.D.   On: 11/30/2020 22:44   DG Chest Port 1 View  Result Date: 12/01/2020 CLINICAL DATA:  Sepsis, difficulty swallowing, lethargy EXAM: PORTABLE CHEST 1 VIEW COMPARISON:  11/30/2020 at 8:55 p.m. FINDINGS: Single frontal view of the chest demonstrates a stable cardiac silhouette with aortic valve prosthesis. Patchy consolidation primarily at the lung bases unchanged since previous exam. No large effusion or pneumothorax. No acute bony abnormality. IMPRESSION: 1. Stable patchy bilateral airspace disease which may reflect edema or infection. Electronically Signed   By: Randa Ngo M.D.   On: 12/01/2020 20:01   DG Chest Port 1 View  Result Date: 11/30/2020 CLINICAL DATA:  Syncope, unresponsive. EXAM: PORTABLE CHEST 1 VIEW COMPARISON:  Chest x-rays dated 06/17/2020 and 11/09/2018. FINDINGS: Stable cardiomegaly. Patchy bilateral airspace opacities. No pleural effusion or pneumothorax is seen. Osseous structures about the chest are unremarkable. Lucency under the LEFT hemidiaphragm, presumably within stomach/bowel but free intraperitoneal air cannot be confidently excluded. IMPRESSION: 1. Patchy bilateral airspace opacities. Differential includes multifocal pneumonia, aspiration and pulmonary edema. 2. Stable cardiomegaly. 3. Lucency under the LEFT hemidiaphragm, presumably within stomach/bowel but free intraperitoneal air cannot be confidently excluded. Recommend further characterization with plain film of the abdomen and/or CT. Electronically Signed   By: Franki Cabot M.D.   On: 11/30/2020 21:24    EKG: Independently reviewed.  A. fib rate controlled.  Assessment/Plan Active Problems:   Essential hypertension   CKD (chronic kidney disease), stage III (HCC)   Hyperthyroidism   Chronic anticoagulation   Interstitial lung disease (HCC)   Atrial fibrillation, persistent (HCC)   S/P TAVR (transcatheter aortic valve replacement)   Diabetes mellitus (HCC)   Dysphagia     1. Difficulty swallowing cause not clear.  Will check MRI brain to make sure there is no stroke.  In addition we will check myasthenia gravis antibodies get swallow evaluation and further plans accordingly. 2. Acute on chronic kidney disease stage III likely could be from dehydration from not eating.  Hold Lasix.  Gently hydrate.  Follow metabolic panel intake output. 3. Possible pneumonia could be from aspiration we will keep patient on empiric antibiotics for aspiration get swallow evaluation. 4. History of A. fib presently rate controlled.  Since patient is n.p.o. we will hold Eliquis and keep patient on heparin. 5. Diabetes mellitus type 2 presently not on medication.  Will check hemoglobin A1c on sliding scale coverage. 6. Hyperthyroidism on methimazole.  Presently NPO.  Once patient passes swallow restart methimazole.  Check thyroid function test. 7. History of diastolic dysfunction holding Lasix due to acute renal failure.  Given that patient has difficulty swallowing with acute renal failure and possible pneumonia will need close monitoring for any further worsening in inpatient status.  Addendum -patient's MRI brain did not show a stroke.  I discussed with on-call neurologist Dr. Lorrin Goodell who at this time advised that patient symptoms may not be related to the stroke seen on MRI.  However advised to get the MRA brain and Dopplers.  Not necessary to get 2D echo since patient has already known A. fib.  Okay to continue heparin for now until patient is passes swallow evaluation.  Also advised to get the myasthenia gravis antibody panel.   DVT prophylaxis: Heparin infusion. Code Status: DNR confirmed with patient's son. Family Communication: Patient's son. Disposition Plan: To be determined. Consults called: Discussed with neurologist. Admission status: Inpatient.   Rise Patience MD [facility] Pager [phone].  If 7PM-7AM, please contact  night-coverage [email or web address] Password [facility]  12/01/2020, 10:15 PM

## 2020-12-01 NOTE — ED Notes (Signed)
Clicked off pt urine order by accident pt did not use the bathroom pt still needs urine sample

## 2020-12-01 NOTE — ED Notes (Signed)
Per Hal Hope MD Pt will be transferred to the floor after STAT MRI in approximately 30 minutes. Called MRI to confirm.

## 2020-12-01 NOTE — ED Triage Notes (Signed)
Pt came from [facility] via EMS. per Staff at [facility], pt unable to swallow medicine this AM, more lethargic, more immobile. Pt seen at [facility] for a syncopal episode yesterday. Pt normally ambulatory, but unable to walk today.  130/80 60 bpm 94% on RA 130 CBG

## 2020-12-01 NOTE — Progress Notes (Signed)
ANTICOAGULATION CONSULT NOTE - Initial Consult  Pharmacy Consult for Heparin Indication: atrial fibrillation  Allergies  Allergen Reactions  . Amoxicillin Other (See Comments)    tongue felt bruised Did it involve swelling of the face/tongue/throat, SOB, or low BP? No Did it involve sudden or severe rash/hives, skin peeling, or any reaction on the inside of your mouth or nose? No Did you need to seek medical attention at a hospital or doctor's office? No When did it last happen?childhood If all above answers are "NO", may proceed with cephalosporin use.    . Celebrex [Celecoxib] Other (See Comments)    Mouth sores  . Amoxicillin Other (See Comments)    "Made my tongue felt bruised"  . Celecoxib Other (See Comments)    Made the mouth sore    Patient Measurements: Height: 5\' 1"  (154.9 cm) Weight: 49.9 kg (110 lb 0.2 oz) IBW/kg (Calculated) : 47.8 Heparin Dosing Weight: TBW  Vital Signs: Temp: 97.7 F (36.5 C) (04/30 1825) Temp Source: Oral (04/30 1825) BP: 138/58 (04/30 2030) Pulse Rate: 55 (04/30 2030)  Labs: Recent Labs    11/30/20 2032 11/30/20 2232 12/01/20 1851  HGB 13.1  --  14.2  HCT 40.3  --  44.7  PLT 211  --  229  APTT  --   --  32  LABPROT  --   --  15.6*  INR  --   --  1.2  CREATININE 1.84*  --  2.22*  TROPONINIHS 14 13  --     Estimated Creatinine Clearance: 10.9 mL/min (A) (by C-G formula based on SCr of 2.22 mg/dL (H)).   Medical History: Past Medical History:  Diagnosis Date  . 1st degree AV block   . Bradycardia   . Chronic diastolic heart failure ([place]) 02/07/2016  . CKD (chronic kidney disease), stage III ([place])   . Colon, diverticulosis   . Diabetes mellitus    diet controlled  . Essential hypertension   . GERD (gastroesophageal reflux disease)   . Hematochezia   . HOH (hard of hearing)    wears bilateral hearing aids  . Hx of orthostatic hypotension   . Interstitial lung disease ([place])   . LBBB (left bundle branch block)    . Long term current use of anticoagulant therapy   . Mediastinal goiter     recurrent s/p resection 1988  . Mild CAD   . Mitral regurgitation   . Osteopenia   . Osteoporosis   . Paroxysmal atrial fibrillation (HCC)    a. CHA2DS2VASc = 5-->coumadin;  b. previously on amio->d/c'd 11/2014 secondary to concern for possible amio lung.  . Pneumonia   . 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 Severe aortic stenosis    s/p TAVR with stable prosthesis by echo 08/2019 - mean AVG 49mmHg  . Stroke ([place])   . Tachycardia-bradycardia ([place])   . Vitamin D deficiency     Medications:  Eliquis 2.5mg  PO BID PTA- LD 4/29 @ 0900  Assessment: 87 yo F presenting from nursing facility with inability to swallow.  Her home medications include Eliquis for Afib which she has been unable to take since yesterday morning.   Pharmacy has been consulted to start IV heparin while Eliquis on hold.   Baseline aPTT, PT/INR, CBC WNL.  Scr elevated at 2.22 Baseline heparin level pending- anticipate this will be elevated due to recent Eliquis dose and  poor renal function.  Plan to monitor IV heparin using aptt until levels correlate.   Goal of Therapy:  APTT 66-102 sec Heparin level 0.3-0.7 units/ml Monitor platelets by anticoagulation protocol: Yes   Plan:  Check baseline heparin level Heparin 2000 units IV bolus x1 followed by heparin infusion at 600 units/hr Check aPTT 8 hrs after infusion starts Daily heparin level, CBC, and aPTT while on heparin F/U ability to resume Eliquis  Netta Cedars PharmD 12/01/2020,10:31 PM

## 2020-12-01 NOTE — ED Notes (Signed)
[facility] contacted to make aware of transport status. Patient en route to facility with [facility].

## 2020-12-01 NOTE — ED Provider Notes (Signed)
[facility] DEPT Provider Note   CSN: [id number] Arrival date & time: 12/01/20  1808     History Chief Complaint  Patient presents with  . Failure To Thrive    Alexa Mcdonald is a 87 y.o. female.  87 yo F with a chief complaints of not being able to swallow.  This started this morning.  Was noticed by her nursing staff.  She lives at assisted living facility.  Patient is demented and unable to provide any history.  Level 5 caveat.  The history is provided by the patient.  Illness Severity:  Moderate Onset quality:  Gradual Duration:  1 day Timing:  Constant Progression:  Worsening Chronicity:  New      Past Medical History:  Diagnosis Date  . 1st degree AV block   . Bradycardia   . Chronic diastolic heart failure ([place]) 02/07/2016  . CKD (chronic kidney disease), stage III ([facility])   . Colon, diverticulosis   . Diabetes mellitus    diet controlled  . Essential hypertension   . GERD (gastroesophageal reflux disease)   . Hematochezia   . HOH (hard of hearing)    wears bilateral hearing aids  . Hx of orthostatic hypotension   . Interstitial lung disease ([place])   . LBBB (left bundle branch block)   . Long term current use of anticoagulant therapy   . Mediastinal goiter     recurrent s/p resection 1988  . Mild CAD   . Mitral regurgitation   . Osteopenia   . Osteoporosis   . Paroxysmal atrial fibrillation (HCC)    a. CHA2DS2VASc = 5-->coumadin;  b. previously on amio->d/c'd 11/2014 secondary to concern for possible amio lung.  . Pneumonia   . 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 Severe aortic stenosis    s/p TAVR with stable prosthesis by echo 08/2019 - mean AVG 48mmHg  . Stroke ([place])   . Tachycardia-bradycardia ([place])   . Vitamin D deficiency     Patient Active Problem List   Diagnosis Date Noted  .  Dysphagia 12/01/2020  . Rhabdomyolysis 06/17/2020  . Lactic acidosis 06/17/2020  . Diabetes mellitus ([place])   . Iron deficiency anemia 12/05/2018  . Duodenal ulcer 12/05/2018  . Gastric erosion 12/05/2018  . History of cholecystectomy 12/05/2018  . History of ERCP 12/05/2018  . Iron deficiency anemia due to chronic blood loss   . Erosive gastritis   . Acute lower GI bleeding 11/09/2018  . Acute blood loss anemia 11/09/2018  . Symptomatic anemia 11/09/2018  . Choledocholithiasis with obstruction 10/23/2018  . Occipital stroke ([place]) 06/11/2016  . Paroxysmal atrial fibrillation ([place]) 06/11/2016  . Homonymous hemianopsia due to old embolic stroke 27/01/2375  . Loss of peripheral visual field 03/12/2016  . Ataxia 03/12/2016  . Dizziness and giddiness 03/12/2016  . Cerebrovascular accident (CVA) due to embolism of posterior cerebral artery with infarctions of both occipital lobes ([place]) 03/12/2016  . Acute on chronic diastolic CHF (congestive heart failure) ([place]) 02/07/2016  . 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 Severe aortic stenosis   . Atrial fibrillation, persistent ([place])   . Mitral regurgitation   . Interstitial lung disease ([place]) 10/30/2014  . Hematochezia 12/13/2013  . CKD (chronic kidney disease), stage III ([place]) 12/13/2013  . Hyperthyroidism 12/13/2013  . GERD (gastroesophageal reflux disease) 12/13/2013  . Chronic anticoagulation 12/13/2013  . Elevated brain  natriuretic peptide (BNP) level 12/13/2013  . Sinus bradycardia 12/13/2013  . Pneumonia 12/13/2013  . Encounter for therapeutic drug monitoring 08/29/2013  . Essential hypertension 08/05/2013  . Edema of both ankles 08/05/2013  . Bradycardia 11/27/2011    Past Surgical History:  Procedure Laterality Date  . BILIARY DILATION  10/24/2018   Procedure: BILIARY DILATION;  Surgeon: Jackquline Denmark, MD;  Location: [facility] ENDOSCOPY;  Service: Endoscopy;;  . BIOPSY  11/11/2018   Procedure: BIOPSY;   Surgeon: Irving Copas., MD;  Location: [facility] ENDOSCOPY;  Service: Gastroenterology;;  . CARDIAC CATHETERIZATION N/A 10/31/2015   Procedure: Right/Left Heart Cath and Coronary Angiography;  Surgeon: [name], MD;  Location: [facility] LAB;  Service: Cardiovascular;  Laterality: N/A;  . CARPAL TUNNEL RELEASE Bilateral   . CATARACT EXTRACTION Bilateral   . CESAREAN SECTION     x3  . CHOLECYSTECTOMY N/A 10/25/2018   Procedure: LAPAROSCOPIC CHOLECYSTECTOMY WITH INTRAOPERATIVE CHOLANGIOGRAM;  Surgeon: Kieth Brightly Arta Bruce, MD;  Location: [facility] ORS;  Service: General;  Laterality: N/A;  . ERCP N/A 10/24/2018   Procedure: ENDOSCOPIC RETROGRADE CHOLANGIOPANCREATOGRAPHY (ERCP);  Surgeon: Jackquline Denmark, MD;  Location: [facility];  Service: Endoscopy;  Laterality: N/A;  . ESOPHAGOGASTRODUODENOSCOPY (EGD) WITH PROPOFOL N/A 11/11/2018   Procedure: ESOPHAGOGASTRODUODENOSCOPY (EGD) WITH PROPOFOL;  Surgeon: Rush Landmark Telford Nab., MD;  Location: [facility];  Service: Gastroenterology;  Laterality: N/A;  . mediastinal removal of a goiter    . MRI  08/18/07   head (diabetes heart study at [facility])  . REMOVAL OF STONES  10/24/2018   Procedure: REMOVAL OF STONES;  Surgeon: Jackquline Denmark, MD;  Location: [facility] ENDOSCOPY;  Service: Endoscopy;;  . Joan Mayans  10/24/2018   Procedure: Joan Mayans;  Surgeon: Jackquline Denmark, MD;  Location: [facility] ENDOS[facility];  Service: Endoscopy;;  . TEE WITHOUT CARDIOVERSION N/A 11/27/2015   Procedure: TRANSESOPHAGEAL ECHOCARDIOGRAM (TEE);  Surgeon: [name], MD;  Location: [facility];  Service: Open Heart Surgery;  Laterality: N/A;  . TRANSCATHETER AORTIC VALVE REPLACEMENT, TRANSFEMORAL N/A 11/27/2015   Procedure: TRANSCATHETER AORTIC VALVE REPLACEMENT, TRANSFEMORAL;  Surgeon: [name], MD;  Location: [facility];  Service: Open Heart Surgery;  Laterality: N/A;     OB History   No obstetric history on file.     Family History  Problem Relation Age of Onset  . Diabetes  Mother   . Cancer - Prostate Father   . Pulmonary embolism Father   . Hypertension Sister   . Diabetes Sister   . Hypertension Brother   . Arrhythmia Brother        afib  . CAD Brother   . Pulmonary fibrosis Brother   . Colon cancer Neg Hx   . Esophageal cancer Neg Hx   . Inflammatory bowel disease Neg Hx   . Liver disease Neg Hx   . Pancreatic cancer Neg Hx   . Stomach cancer Neg Hx   . Rectal cancer Neg Hx     Social History   Tobacco Use  . Smoking status: Never Smoker  . Smokeless tobacco: Never Used  Vaping Use  . Vaping Use: Never used  Substance Use Topics  . Alcohol use: Not Currently  . Drug use: Never    Home Medications Prior to Admission medications   Medication Sig Start Date End Date Taking? Authorizing Provider  acetaminophen (TYLENOL) 500 MG tablet Take 1-2 tablets (500-1,000 mg total) by mouth every 6 (six) hours as needed for mild pain or moderate pain (or headaches). 11/21/20  Yes Domenic Moras, PA-C  atorvastatin (  LIPITOR) 10 MG tablet Take 10 mg by mouth daily.   Yes [provider]  Cyanocobalamin (VITAMIN B-12 PO) Take 1 tablet by mouth daily.   Yes [provider]  ELIQUIS 2.5 MG TABS tablet TAKE 1 TABLET(2.5 MG) BY MOUTH TWICE DAILY 07/23/20  Yes Turner, Traci R, MD  furosemide (LASIX) 20 MG tablet Take 20 mg by mouth every other day.   Yes [provider]  methimazole (TAPAZOLE) 5 MG tablet Take 2.5 mg by mouth in the morning.   Yes [provider]  Multiple Vitamins-Minerals (OCUVITE PRESERVISION PO) Take 1 capsule by mouth in the morning.   Yes [provider]  pantoprazole (PROTONIX) 40 MG tablet TAKE 1 TABLET(40 MG) BY MOUTH DAILY AT 6 AM Patient taking differently: Take 40 mg by mouth daily with breakfast. 03/30/19  Yes Mansouraty, Telford Nab., MD  vitamin B-12 (CYANOCOBALAMIN) 1000 MCG tablet Take 1,000 mcg by mouth daily with breakfast.    Yes [provider]  acetaminophen (TYLENOL) 325 MG  tablet Take 2 tablets (650 mg total) by mouth every 6 (six) hours as needed for mild pain. Patient not taking: No sig reported 10/26/18   Swayze, Ava, DO  ferrous sulfate (FEROSUL) 325 (65 FE) MG tablet Take 1 tablet (325 mg total) by mouth daily with breakfast. Patient not taking: Reported on 12/01/2020 09/03/20   Mansouraty, Telford Nab., MD  insulin aspart (NOVOLOG) 100 UNIT/ML injection 0-9 Units, Subcutaneous, 3 times daily with meals CBG < 70: Implement Hypoglycemia measures CBG 70 - 120: 0 units CBG 121 - 150: 1 unit CBG 151 - 200: 2 units CBG 201 - 250: 3 units CBG 251 - 300: 5 units CBG 301 - 350: 7 units CBG 351 - 400: 9 units CBG > 400: call MD Patient not taking: No sig reported 06/21/20   Ghimire, Henreitta Leber, MD  polyethylene glycol (MIRALAX / GLYCOLAX) 17 g packet Take 17 g by mouth daily. Patient not taking: No sig reported 06/21/20   Jonetta Osgood, MD    Allergies    Amoxicillin, Celebrex [celecoxib], Amoxicillin, and Celecoxib  Review of Systems   Review of Systems  Unable to perform ROS: Dementia    Physical Exam Updated Vital Signs BP (!) 138/58   Pulse (!) 55   Temp 97.7 F (36.5 C) (Oral)   Resp 14   Ht 5\' 1"  (1.549 m)   Wt 49.9 kg   SpO2 95%   BMI 20.79 kg/m   Physical Exam Vitals and nursing note reviewed.  Constitutional:      General: She is not in acute distress.    Appearance: She is well-developed. She is not diaphoretic.  HENT:     Head: Normocephalic and atraumatic.  Eyes:     Pupils: Pupils are equal, round, and reactive to light.  Cardiovascular:     Rate and Rhythm: Normal rate and regular rhythm.     Heart sounds: No murmur heard. No friction rub. No gallop.   Pulmonary:     Effort: Pulmonary effort is normal.     Breath sounds: No wheezing or rales.  Abdominal:     General: There is no distension.     Palpations: Abdomen is soft.     Tenderness: There is no abdominal tenderness.  Musculoskeletal:        General: No  tenderness.     Cervical back: Normal range of motion and neck supple.  Skin:    General: Skin is warm and dry.  Neurological:     Mental Status: She is alert and oriented to person, place, and time.  Psychiatric:        Behavior: Behavior normal.     ED Results / Procedures / Treatments   Labs (all labs ordered are listed, but only abnormal results are displayed) Labs Reviewed  COMPREHENSIVE METABOLIC PANEL - Abnormal; Notable for the following components:      Result Value   Glucose, Bld 123 (*)    BUN 29 (*)    Creatinine, Ser 2.22 (*)    Total Bilirubin 1.3 (*)    GFR, Estimated 20 (*)    All other components within normal limits  CBC WITH DIFFERENTIAL/PLATELET - Abnormal; Notable for the following components:   WBC 10.7 (*)    Neutro Abs 8.7 (*)    All other components within normal limits  PROTIME-INR - Abnormal; Notable for the following components:   Prothrombin Time 15.6 (*)    All other components within normal limits  RESP PANEL BY RT-PCR (FLU A&B, COVID) ARPGX2  URINE CULTURE  CULTURE, BLOOD (ROUTINE X 2)  CULTURE, BLOOD (ROUTINE X 2)  LACTIC ACID, PLASMA  LACTIC ACID, PLASMA  APTT  URINALYSIS, ROUTINE W REFLEX MICROSCOPIC  COMPREHENSIVE METABOLIC PANEL  HEPARIN LEVEL (UNFRACTIONATED)  CBC    EKG EKG Interpretation  Date/Time:  Saturday December 01 2020 18:33:55 EDT Ventricular Rate:  63 PR Interval:    QRS Duration: 142 QT Interval:  484 QTC Calculation: 496 R Axis:   -11 Text Interpretation: Atrial fibrillation Left bundle branch block No significant change since last tracing Confirmed by Deno Etienne [id number]) on 12/01/2020 7:40:54 PM   Radiology DG Neck Soft Tissue  Result Date: 12/01/2020 CLINICAL DATA:  Difficulty swallowing, lethargy EXAM: NECK SOFT TISSUES - 1+ VIEW COMPARISON:  None. FINDINGS: Frontal and lateral views of the soft tissues of the neck are obtained. Prevertebral soft tissues appear unremarkable. Epiglottis is normal. The airway is  patent. Surgical clips are seen in the right supraclavicular region. No acute bony abnormalities. Multilevel cervical facet hypertrophy. Mild anterolisthesis of C5 on C6 likely due to degenerative changes. IMPRESSION: 1. Unremarkable soft tissues of the neck. 2. Multilevel cervical degenerative changes. Electronically Signed   By: Randa Ngo M.D.   On: 12/01/2020 20:03   CT Head Wo Contrast  Result Date: 11/30/2020 CLINICAL DATA:  Unknown cause, mental status change. EXAM: CT HEAD WITHOUT CONTRAST TECHNIQUE: Contiguous axial images were obtained from the base of the skull through the vertex without intravenous contrast. COMPARISON:  CT head 06/18/2019 FINDINGS: Brain: Cerebral ventricle sizes are concordant with the degree of cerebral volume loss. Patchy and confluent areas of decreased attenuation are noted throughout the deep and periventricular white matter of the cerebral hemispheres bilaterally, compatible with chronic microvascular ischemic disease. Chronic left occipital infarction. No evidence of large-territorial acute infarction. No parenchymal hemorrhage. No mass lesion. No extra-axial collection. No mass effect or midline shift. No hydrocephalus. Basilar cisterns are patent. Vascular: No hyperdense vessel. Atherosclerotic calcifications are present within the cavernous internal carotid arteries. Skull: No acute fracture or focal lesion. Sinuses/Orbits: Paranasal sinuses and mastoid air cells are clear. Bilateral lens replacement. Otherwise orbits are unremarkable. Other: None. IMPRESSION: No acute intracranial abnormality. Electronically Signed   By: Iven Finn M.D.   On: 11/30/2020 22:44   DG Chest Port 1 View  Result Date: 12/01/2020 CLINICAL DATA:  Sepsis, difficulty swallowing, lethargy EXAM: PORTABLE CHEST 1 VIEW COMPARISON:  11/30/2020 at 8:55 p.m. FINDINGS: Single frontal  view of the chest demonstrates a stable cardiac silhouette with aortic valve prosthesis. Patchy consolidation  primarily at the lung bases unchanged since previous exam. No large effusion or pneumothorax. No acute bony abnormality. IMPRESSION: 1. Stable patchy bilateral airspace disease which may reflect edema or infection. Electronically Signed   By: Randa Ngo M.D.   On: 12/01/2020 20:01   DG Chest Port 1 View  Result Date: 11/30/2020 CLINICAL DATA:  Syncope, unresponsive. EXAM: PORTABLE CHEST 1 VIEW COMPARISON:  Chest x-rays dated 06/17/2020 and 11/09/2018. FINDINGS: Stable cardiomegaly. Patchy bilateral airspace opacities. No pleural effusion or pneumothorax is seen. Osseous structures about the chest are unremarkable. Lucency under the LEFT hemidiaphragm, presumably within stomach/bowel but free intraperitoneal air cannot be confidently excluded. IMPRESSION: 1. Patchy bilateral airspace opacities. Differential includes multifocal pneumonia, aspiration and pulmonary edema. 2. Stable cardiomegaly. 3. Lucency under the LEFT hemidiaphragm, presumably within stomach/bowel but free intraperitoneal air cannot be confidently excluded. Recommend further characterization with plain film of the abdomen and/or CT. Electronically Signed   By: Franki Cabot M.D.   On: 11/30/2020 21:24    Procedures Procedures   Medications Ordered in ED Medications  insulin aspart (novoLOG) injection 0-6 Units (has no administration in time range)  0.9 %  sodium chloride infusion (has no administration in time range)  heparin bolus via infusion 2,000 Units (has no administration in time range)    Followed by  heparin ADULT infusion 100 units/mL (25000 units/283mL) (has no administration in time range)  sodium chloride 0.9 % bolus 1,000 mL (0 mLs Intravenous Stopped 12/01/20 2042)    ED Course  I have reviewed the triage vital signs and the nursing notes.  Pertinent labs & imaging results that were available during my care of the patient were reviewed by me and considered in my medical decision making (see chart for  details).    MDM Rules/Calculators/A&P                          87 yo F with a chief complaints of not able to swallow.  The nursing staff this morning noticed that she would not take her morning meds.  Family thinks that this is started this morning.  The nursing staff was concerned that maybe she had an esophageal foreign body and suggested she come to the ED for evaluation.  I am unable to get any history from the patient on this problem.  Is not drooling.  Will obtain a laboratory evaluation.  Chest x-ray.  She was seen yesterday and had an episode where she seemed to have unintentionally overdosed on narcotics.  Could have been the cause of her initial aspiration event yesterday.   Chest x-ray similar to yesterday.  Question aspiration versus multifocal pneumonia.  AKI.  As patient is still failing to thrive and not able to swallow even water here at bedside will discuss with medicine for possible admission.  The patients results and plan were reviewed and discussed.   Any x-rays performed were independently reviewed by myself.   Differential diagnosis were considered with the presenting HPI.  Medications  insulin aspart (novoLOG) injection 0-6 Units (has no administration in time range)  0.9 %  sodium chloride infusion (has no administration in time range)  heparin bolus via infusion 2,000 Units (has no administration in time range)    Followed by  heparin ADULT infusion 100 units/mL (25000 units/23mL) (has no administration in time range)  sodium chloride 0.9 % bolus  1,000 mL (0 mLs Intravenous Stopped 12/01/20 2042)    Vitals:   12/01/20 1930 12/01/20 2000 12/01/20 2030 12/01/20 2035  BP:  128/62 (!) 138/58   Pulse: (!) 56 (!) 56 (!) 55   Resp: 16 10 (!) 9 14  Temp:      TempSrc:      SpO2: 93% 94% 95%   Weight:      Height:        Final diagnoses:  Dysphagia, unspecified type  AKI (acute kidney injury) ([place])    Admission/ observation were discussed with the  admitting physician, patient and/or family and they are comfortable with the plan.    Final Clinical Impression(s) / ED Diagnoses Final diagnoses:  Dysphagia, unspecified type  AKI (acute kidney injury) [facility])    Rx / DC Orders ED Discharge Orders    None       Deno Etienne, DO 12/01/20 2258

## 2020-12-02 ENCOUNTER — Other Ambulatory Visit: Payer: Self-pay

## 2020-12-02 ENCOUNTER — Inpatient Hospital Stay (HOSPITAL_COMMUNITY): Payer: Medicare Other

## 2020-12-02 ENCOUNTER — Encounter (HOSPITAL_COMMUNITY): Payer: Self-pay | Admitting: Internal Medicine

## 2020-12-02 DIAGNOSIS — Z8249 Family history of ischemic heart disease and other diseases of the circulatory system: Secondary | ICD-10-CM | POA: Diagnosis not present

## 2020-12-02 DIAGNOSIS — I639 Cerebral infarction, unspecified: Principal | ICD-10-CM

## 2020-12-02 DIAGNOSIS — I959 Hypotension, unspecified: Secondary | ICD-10-CM | POA: Diagnosis not present

## 2020-12-02 DIAGNOSIS — K219 Gastro-esophageal reflux disease without esophagitis: Secondary | ICD-10-CM | POA: Diagnosis present

## 2020-12-02 DIAGNOSIS — I130 Hypertensive heart and chronic kidney disease with heart failure and stage 1 through stage 4 chronic kidney disease, or unspecified chronic kidney disease: Secondary | ICD-10-CM | POA: Diagnosis present

## 2020-12-02 DIAGNOSIS — E1122 Type 2 diabetes mellitus with diabetic chronic kidney disease: Secondary | ICD-10-CM | POA: Diagnosis present

## 2020-12-02 DIAGNOSIS — Z7901 Long term (current) use of anticoagulants: Secondary | ICD-10-CM | POA: Diagnosis not present

## 2020-12-02 DIAGNOSIS — R471 Dysarthria and anarthria: Secondary | ICD-10-CM | POA: Diagnosis present

## 2020-12-02 DIAGNOSIS — Z743 Need for continuous supervision: Secondary | ICD-10-CM | POA: Diagnosis not present

## 2020-12-02 DIAGNOSIS — R1312 Dysphagia, oropharyngeal phase: Secondary | ICD-10-CM | POA: Diagnosis not present

## 2020-12-02 DIAGNOSIS — Z515 Encounter for palliative care: Secondary | ICD-10-CM | POA: Diagnosis not present

## 2020-12-02 DIAGNOSIS — Z20822 Contact with and (suspected) exposure to covid-19: Secondary | ICD-10-CM | POA: Diagnosis present

## 2020-12-02 DIAGNOSIS — I482 Chronic atrial fibrillation, unspecified: Secondary | ICD-10-CM | POA: Diagnosis present

## 2020-12-02 DIAGNOSIS — M6281 Muscle weakness (generalized): Secondary | ICD-10-CM | POA: Diagnosis not present

## 2020-12-02 DIAGNOSIS — I5032 Chronic diastolic (congestive) heart failure: Secondary | ICD-10-CM | POA: Diagnosis present

## 2020-12-02 DIAGNOSIS — Z8673 Personal history of transient ischemic attack (TIA), and cerebral infarction without residual deficits: Secondary | ICD-10-CM | POA: Diagnosis not present

## 2020-12-02 DIAGNOSIS — R1310 Dysphagia, unspecified: Secondary | ICD-10-CM | POA: Diagnosis present

## 2020-12-02 DIAGNOSIS — I69398 Other sequelae of cerebral infarction: Secondary | ICD-10-CM | POA: Diagnosis not present

## 2020-12-02 DIAGNOSIS — N179 Acute kidney failure, unspecified: Secondary | ICD-10-CM | POA: Diagnosis present

## 2020-12-02 DIAGNOSIS — Z952 Presence of prosthetic heart valve: Secondary | ICD-10-CM | POA: Diagnosis not present

## 2020-12-02 DIAGNOSIS — I129 Hypertensive chronic kidney disease with stage 1 through stage 4 chronic kidney disease, or unspecified chronic kidney disease: Secondary | ICD-10-CM | POA: Diagnosis not present

## 2020-12-02 DIAGNOSIS — H9190 Unspecified hearing loss, unspecified ear: Secondary | ICD-10-CM | POA: Diagnosis present

## 2020-12-02 DIAGNOSIS — I4819 Other persistent atrial fibrillation: Secondary | ICD-10-CM | POA: Diagnosis not present

## 2020-12-02 DIAGNOSIS — R41841 Cognitive communication deficit: Secondary | ICD-10-CM | POA: Diagnosis not present

## 2020-12-02 DIAGNOSIS — I69891 Dysphagia following other cerebrovascular disease: Secondary | ICD-10-CM | POA: Diagnosis not present

## 2020-12-02 DIAGNOSIS — I69328 Other speech and language deficits following cerebral infarction: Secondary | ICD-10-CM | POA: Diagnosis not present

## 2020-12-02 DIAGNOSIS — R29818 Other symptoms and signs involving the nervous system: Secondary | ICD-10-CM | POA: Diagnosis not present

## 2020-12-02 DIAGNOSIS — I69391 Dysphagia following cerebral infarction: Secondary | ICD-10-CM | POA: Diagnosis not present

## 2020-12-02 DIAGNOSIS — R4182 Altered mental status, unspecified: Secondary | ICD-10-CM | POA: Diagnosis not present

## 2020-12-02 DIAGNOSIS — J690 Pneumonitis due to inhalation of food and vomit: Secondary | ICD-10-CM | POA: Diagnosis present

## 2020-12-02 DIAGNOSIS — E039 Hypothyroidism, unspecified: Secondary | ICD-10-CM | POA: Diagnosis present

## 2020-12-02 DIAGNOSIS — Z66 Do not resuscitate: Secondary | ICD-10-CM | POA: Diagnosis present

## 2020-12-02 DIAGNOSIS — N1832 Chronic kidney disease, stage 3b: Secondary | ICD-10-CM

## 2020-12-02 DIAGNOSIS — I447 Left bundle-branch block, unspecified: Secondary | ICD-10-CM | POA: Diagnosis present

## 2020-12-02 DIAGNOSIS — J849 Interstitial pulmonary disease, unspecified: Secondary | ICD-10-CM | POA: Diagnosis present

## 2020-12-02 DIAGNOSIS — I2510 Atherosclerotic heart disease of native coronary artery without angina pectoris: Secondary | ICD-10-CM | POA: Diagnosis not present

## 2020-12-02 DIAGNOSIS — Z8042 Family history of malignant neoplasm of prostate: Secondary | ICD-10-CM | POA: Diagnosis not present

## 2020-12-02 DIAGNOSIS — Z7189 Other specified counseling: Secondary | ICD-10-CM | POA: Diagnosis not present

## 2020-12-02 DIAGNOSIS — I69828 Other speech and language deficits following other cerebrovascular disease: Secondary | ICD-10-CM | POA: Diagnosis not present

## 2020-12-02 DIAGNOSIS — R2689 Other abnormalities of gait and mobility: Secondary | ICD-10-CM | POA: Diagnosis not present

## 2020-12-02 DIAGNOSIS — Z9049 Acquired absence of other specified parts of digestive tract: Secondary | ICD-10-CM | POA: Diagnosis not present

## 2020-12-02 DIAGNOSIS — R279 Unspecified lack of coordination: Secondary | ICD-10-CM | POA: Diagnosis not present

## 2020-12-02 DIAGNOSIS — Z833 Family history of diabetes mellitus: Secondary | ICD-10-CM | POA: Diagnosis not present

## 2020-12-02 DIAGNOSIS — R29707 NIHSS score 7: Secondary | ICD-10-CM | POA: Diagnosis present

## 2020-12-02 LAB — CBC
HCT: 38.4 % (ref 36.0–46.0)
Hemoglobin: 12.3 g/dL (ref 12.0–15.0)
MCH: 31.6 pg (ref 26.0–34.0)
MCHC: 32 g/dL (ref 30.0–36.0)
MCV: 98.7 fL (ref 80.0–100.0)
Platelets: 199 10*3/uL (ref 150–400)
RBC: 3.89 MIL/uL (ref 3.87–5.11)
RDW: 14.9 % (ref 11.5–15.5)
WBC: 8.6 10*3/uL (ref 4.0–10.5)
nRBC: 0 % (ref 0.0–0.2)

## 2020-12-02 LAB — COMPREHENSIVE METABOLIC PANEL
ALT: 9 U/L (ref 0–44)
AST: 15 U/L (ref 15–41)
Albumin: 3.2 g/dL — ABNORMAL LOW (ref 3.5–5.0)
Alkaline Phosphatase: 76 U/L (ref 38–126)
Anion gap: 10 (ref 5–15)
BUN: 29 mg/dL — ABNORMAL HIGH (ref 8–23)
CO2: 26 mmol/L (ref 22–32)
Calcium: 8.6 mg/dL — ABNORMAL LOW (ref 8.9–10.3)
Chloride: 106 mmol/L (ref 98–111)
Creatinine, Ser: 1.82 mg/dL — ABNORMAL HIGH (ref 0.44–1.00)
GFR, Estimated: 25 mL/min — ABNORMAL LOW (ref >60)
Glucose, Bld: 85 mg/dL (ref 70–99)
Potassium: 4.5 mmol/L (ref 3.5–5.1)
Sodium: 142 mmol/L (ref 135–145)
Total Bilirubin: 1.1 mg/dL (ref 0.3–1.2)
Total Protein: 6 g/dL — ABNORMAL LOW (ref 6.5–8.1)

## 2020-12-02 LAB — T4, FREE: Free T4: 1.23 ng/dL — ABNORMAL HIGH (ref 0.61–1.12)

## 2020-12-02 LAB — APTT
aPTT: 58 seconds — ABNORMAL HIGH (ref 24–36)
aPTT: 52 seconds — ABNORMAL HIGH (ref 24–36)

## 2020-12-02 LAB — GLUCOSE, CAPILLARY
Glucose-Capillary: 95 mg/dL (ref 70–99)
Glucose-Capillary: 88 mg/dL (ref 70–99)
Glucose-Capillary: 88 mg/dL (ref 70–99)
Glucose-Capillary: 102 mg/dL — ABNORMAL HIGH (ref 70–99)
Glucose-Capillary: 125 mg/dL — ABNORMAL HIGH (ref 70–99)

## 2020-12-02 LAB — HEPARIN LEVEL (UNFRACTIONATED)
Heparin Unfractionated: 0.85 IU/mL — ABNORMAL HIGH (ref 0.30–0.70)
Heparin Unfractionated: 1.05 IU/mL — ABNORMAL HIGH (ref 0.30–0.70)
Heparin Unfractionated: 1.1 IU/mL — ABNORMAL HIGH (ref 0.30–0.70)

## 2020-12-02 LAB — LIPID PANEL
Cholesterol: 117 mg/dL (ref 0–200)
HDL: 35 mg/dL — ABNORMAL LOW (ref >40)
LDL Cholesterol: 70 mg/dL (ref 0–99)
Total CHOL/HDL Ratio: 3.3 RATIO
Triglycerides: 59 mg/dL (ref <150)
VLDL: 12 mg/dL (ref 0–40)

## 2020-12-02 LAB — HEMOGLOBIN A1C
Hgb A1c MFr Bld: 6.2 % — ABNORMAL HIGH (ref 4.8–5.6)
Mean Plasma Glucose: 131.24 mg/dL

## 2020-12-02 LAB — TSH: TSH: 0.48 u[IU]/mL (ref 0.350–4.500)

## 2020-12-02 IMAGING — MR MR MRA HEAD W/O CM
6 series · 37 of 48 positions shown · non-contrast
Comparison: Brain MRI [DATE].

CLINICAL DATA: [AGE] female with neurologic deficit. MRI
yesterday reveals several punctate cortical infarcts in the
posterior right MCA territory.

EXAM:
MRA HEAD WITHOUT CONTRAST
TECHNIQUE: Angiographic images of the Circle of Willis were obtained using MRA
technique without intravenous contrast.

[Series 1: aahead_scout · sagittal · 1.6mm · 1.62mm/px · 17 of 121 slices shown]
[im 1/121]
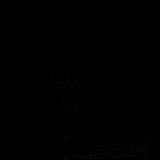
[im 8/121]
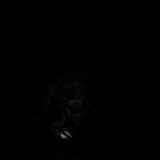
[im 16/121]
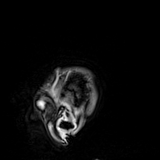
[im 23/121]
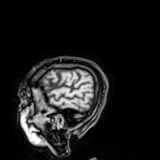
[im 31/121]
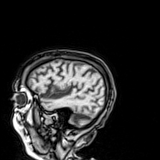
[im 38/121]
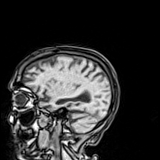
[im 46/121]
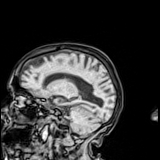
[im 53/121]
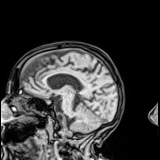
[im 61/121]
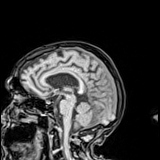
[im 68/121]
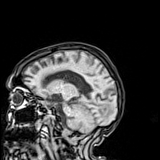
[im 76/121]
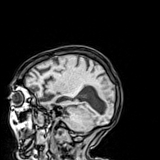
[im 83/121]
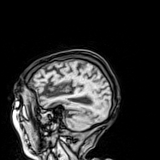
[im 91/121]
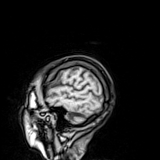
[im 98/121]
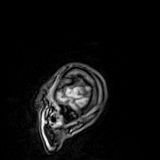
[im 106/121]
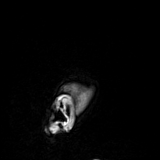
[im 113/121]
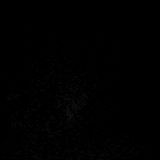
[im 121/121]
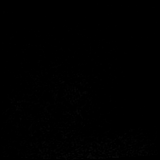

[Series 2: aahead_scout_mpr_sag · oblique · 1.6mm · 1.60mm/px · 1 of 5 slices shown]
[im 1/5]
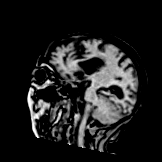

[Series 3: aahead_scout_mpr_cor · coronal · 1.6mm · 1.60mm/px · 1 of 3 slices shown]
[im 1/3]
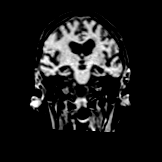

[Series 4: aahead_scout_mpr_tra · axial · 1.6mm · 1.60mm/px · 1 of 3 slices shown]
[im 1/3]
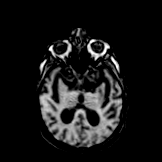

[Series 5: vessel_scout_head_msum · sagittal · 6.0mm · 0.59mm/px · 3 of 24 slices shown]
[im 1/24]
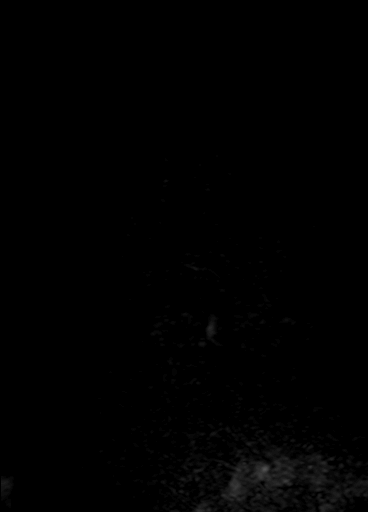
[im 12/24]
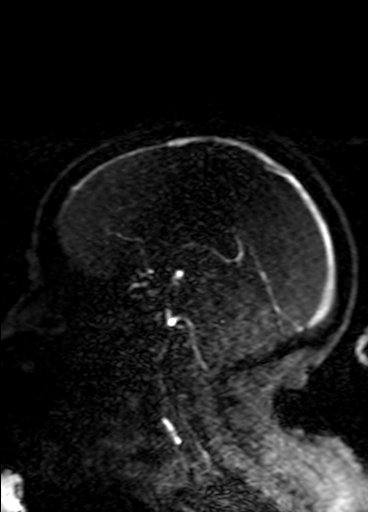
[im 24/24]
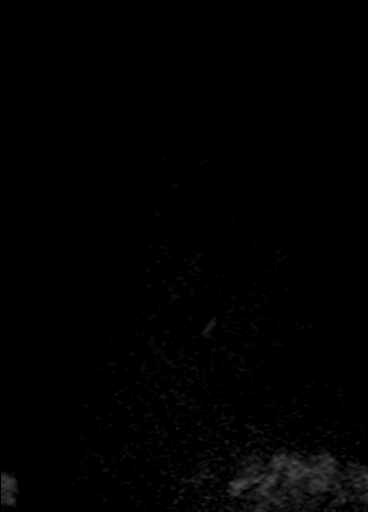

[Series 8: TOF · axial · 0.6mm · 0.35mm/px · z∈[-83,+14]mm · 14 of 172 slices shown]
[im 1/172]
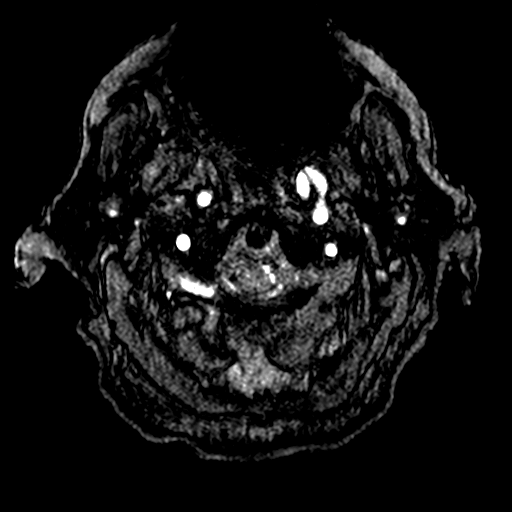
[im 8/172]
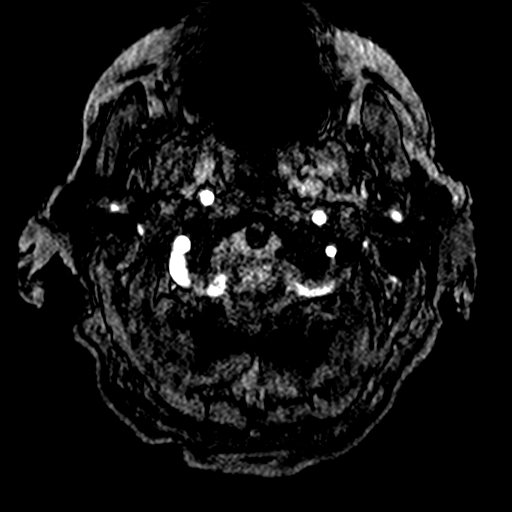
[im 15/172]
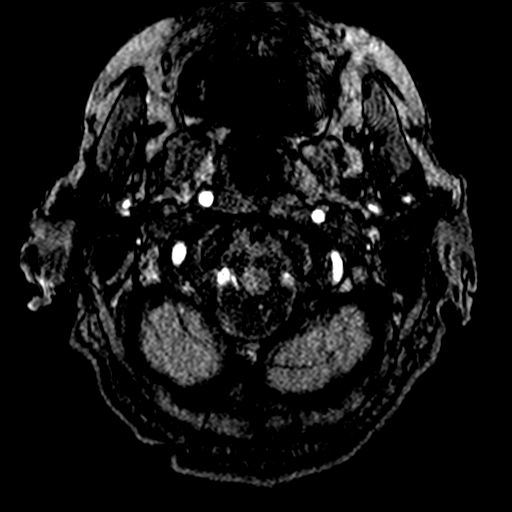
[im 22/172]
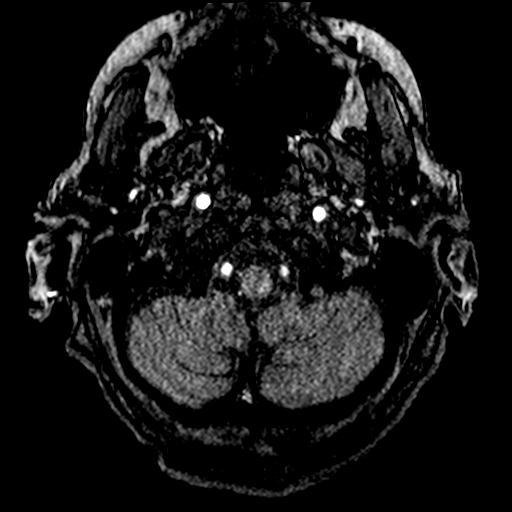
[im 29/172]
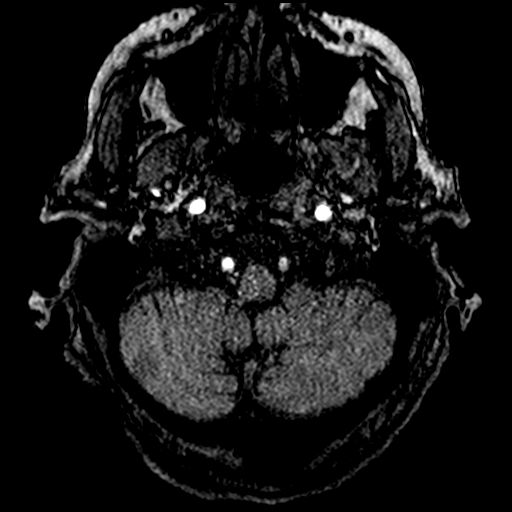
[im 36/172]
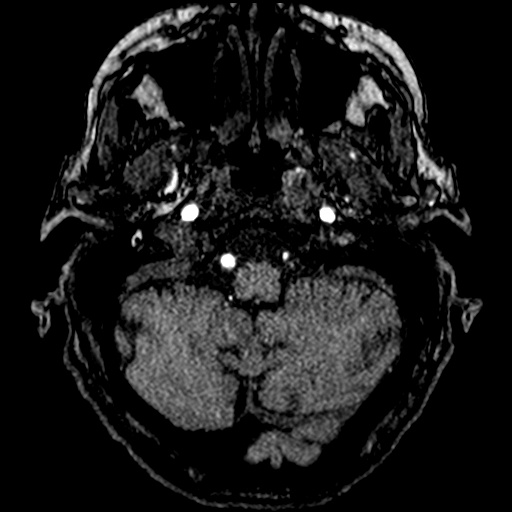
[im 43/172]
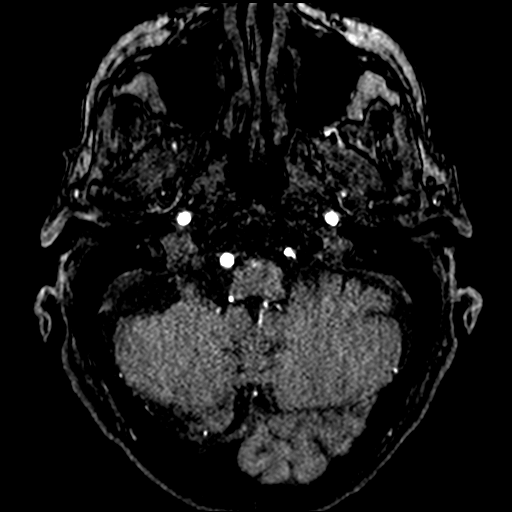
[im 50/172]
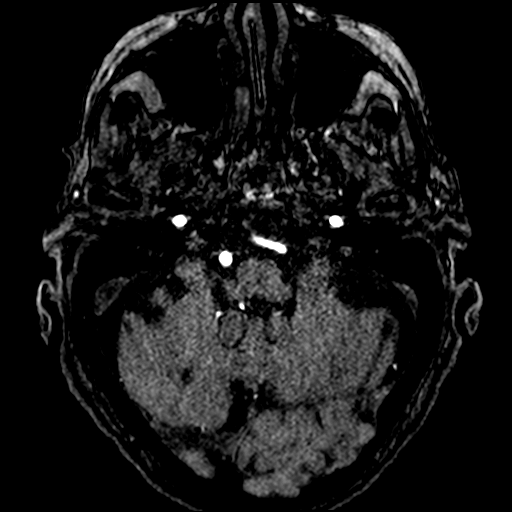
[im 58/172]
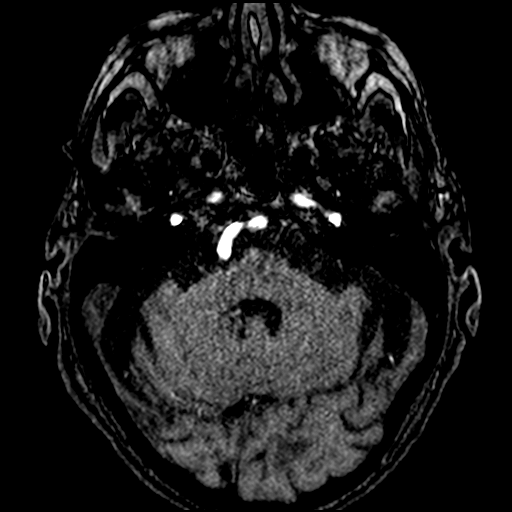
[im 72/172]
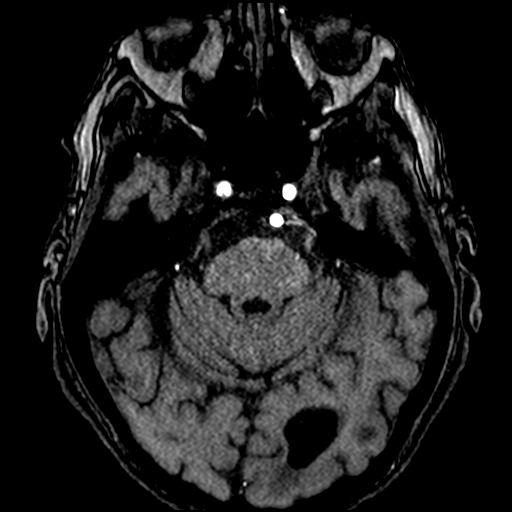
[im 100/172]
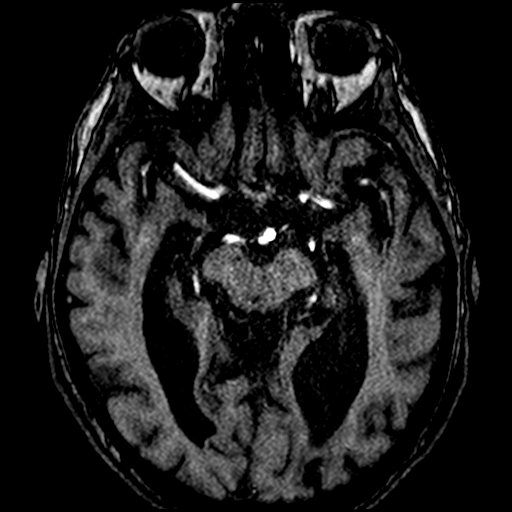
[im 122/172]
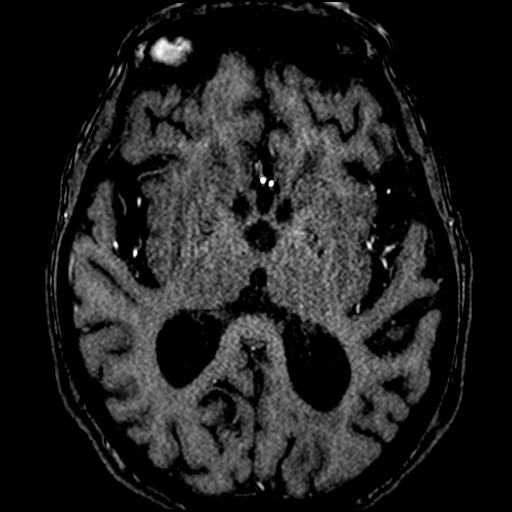
[im 143/172]
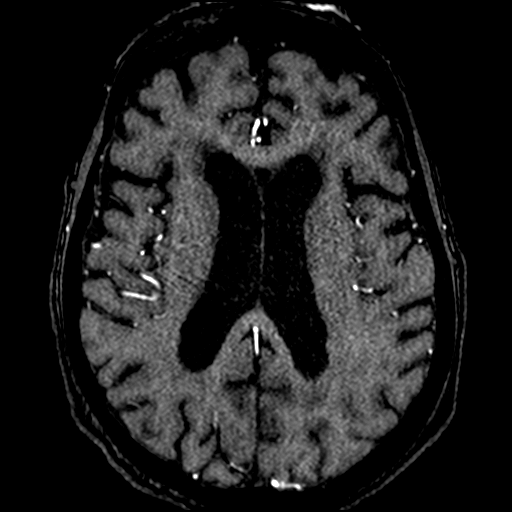
[im 164/172]
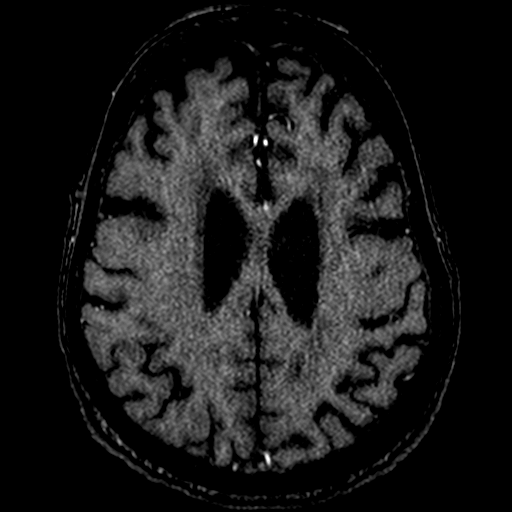

[37 of 48 positions shown; findings below may reference images not displayed]

FINDINGS: Antegrade flow in the posterior circulation with dominant distal
right vertebral artery. PICA origins appear patent. MOTSA artifact
in the V4 segments but no distal vertebral stenosis suspected.
Patent vertebrobasilar junction. Tortuous basilar artery without
stenosis. Patent SCA and PCA origins. Posterior communicating
arteries are diminutive or absent. Bilateral PCA branches are within
normal limits.

Antegrade flow in both ICA siphons. Tortuous left ICA below the
skull base at the C1 level. No siphon stenosis. Mild siphon
irregularity. Patent carotid termini, MCA and ACA origins.
Diminutive or absent anterior communicating artery. Visible ACA
branches are within normal limits. MCA M1 segments and MCA
bifurcations appear patent without stenosis. Visible bilateral MCA
branches are within normal limits.
IMPRESSION: Negative for age intracranial MRA.

## 2020-12-02 MED ORDER — SODIUM CHLORIDE 0.9 % IV SOLN
1.0000 g | INTRAVENOUS | Status: DC
  Administered 2020-12-02 – 2020-12-04 (×3): 1 g via INTRAVENOUS
  Filled 2020-12-02: qty 10
  Filled 2020-12-02 (×3): qty 1

## 2020-12-02 MED ORDER — STROKE: EARLY STAGES OF RECOVERY BOOK
Freq: Once | Status: DC
  Filled 2020-12-02: qty 1

## 2020-12-02 MED ORDER — SODIUM CHLORIDE 0.9 % IV SOLN
1.5000 g | INTRAVENOUS | Status: DC
  Filled 2020-12-02: qty 4

## 2020-12-02 MED ORDER — APIXABAN 2.5 MG PO TABS
2.5000 mg | ORAL_TABLET | Freq: Two times a day (BID) | ORAL | Status: DC
  Administered 2020-12-02 – 2020-12-05 (×6): 2.5 mg via ORAL
  Filled 2020-12-02 (×6): qty 1

## 2020-12-02 MED ORDER — HEPARIN (PORCINE) 25000 UT/250ML-% IV SOLN
700.0000 [IU]/h | INTRAVENOUS | Status: DC
  Filled 2020-12-02: qty 250

## 2020-12-02 MED ORDER — PANTOPRAZOLE SODIUM 40 MG PO TBEC
40.0000 mg | DELAYED_RELEASE_TABLET | Freq: Every day | ORAL | Status: DC
  Administered 2020-12-03 – 2020-12-05 (×3): 40 mg via ORAL
  Filled 2020-12-02 (×3): qty 1

## 2020-12-02 MED ORDER — ATORVASTATIN CALCIUM 10 MG PO TABS
10.0000 mg | ORAL_TABLET | Freq: Every day | ORAL | Status: DC
  Administered 2020-12-03 – 2020-12-05 (×3): 10 mg via ORAL
  Filled 2020-12-02 (×3): qty 1

## 2020-12-02 MED ORDER — METHIMAZOLE 5 MG PO TABS
2.5000 mg | ORAL_TABLET | Freq: Every morning | ORAL | Status: DC
  Administered 2020-12-03 – 2020-12-05 (×3): 2.5 mg via ORAL
  Filled 2020-12-02 (×4): qty 1

## 2020-12-02 MED ORDER — SODIUM CHLORIDE 0.9 % IV SOLN
500.0000 mg | INTRAVENOUS | Status: DC
  Administered 2020-12-02 – 2020-12-04 (×3): 500 mg via INTRAVENOUS
  Filled 2020-12-02 (×3): qty 500

## 2020-12-02 NOTE — Progress Notes (Addendum)
PROGRESS NOTE    Alexa Mcdonald  UYQ:[id number] DOB: 07-29-1921 DOA: 12/01/2020 PCP: Jonathon Jordan, MD   Brief Narrative: Alexa Mcdonald is a 87 y.o.  female with history of chronic A. fib, diabetes mellitus, hyperthyroidism. Patient presented from her SNF secondary to dysphagia and found to have an acute stroke.   Assessment & Plan:   Active Problems:   Essential hypertension   CKD (chronic kidney disease), stage III (HCC)   Hyperthyroidism   Chronic anticoagulation   Interstitial lung disease (HCC)   Atrial fibrillation, persistent (HCC)   S/P TAVR (transcatheter aortic valve replacement)   Diabetes mellitus ([place])   Dysphagia   Acute CVA (cerebrovascular accident) (Alexa Mcdonald)   Dysphagia Concern this is secondary to CVA. Failed bedside swallow study -SLP recommendations pending -NPO  Acute CVA MRI brain significant for multiple subcentimeter ischemic infarcts of right parietal cortex. Neurology consulted. LDL of 70. TSH of 0.48. MRA without etiology. -Neurology recommendations -PT/OT/SLP recommendations -Transthoracic Echocardiogram pending  Chronic atrial fibrillation Patient is on Eliquis as an outpatient. Started on heparin drip secondary to dysphagia -Continue Heparin IV  Possible aspiration pneumonia In setting of dysphagia. Empirically started on Ceftriaxone and azithromycin on admission -Continue Ceftriaxone/Azithromycin  AKI on CKD stage IIIb Baseline creatinine of 1.4-1.8. Creatinine of 2.22 on admission. Improved today. -Continue IV fluids  Diabetes mellitus, type 2 Last hemoglobin A1C of 7.3 from 06/2020.  Hyperthyroidism Patient is on methimazole as an outpatient. Methimazole held secondary to dysphagia.  Diastolic heart dysfunction History of TAVR History. Patient is on Lasix as an outpatient. Euvolemic. Transthoracic Echocardiogram ordered for above. Pending.  DVT prophylaxis: Heparin IV Code Status:   Code Status: DNR Family  Communication: Son on telephone Disposition Plan: Discharge likely back to SNF pending continued workup/management   Consultants:   Neurology  Procedures:   None  Antimicrobials:  None    Subjective: No concerns.  Objective: Vitals:   12/01/20 2328 12/02/20 0341 12/02/20 0634 12/02/20 1019  BP: (!) 140/54 (!) 142/47  (!) 161/67  Pulse: (!) 54 (!) 52 60 (!) 54  Resp: 15 15  16   Temp: 97.8 F (36.6 C) 97.8 F (36.6 C)  98.2 F (36.8 C)  TempSrc: Oral Oral  Oral  SpO2: 93% 94% 94% 95%  Weight:      Height:        Intake/Output Summary (Last 24 hours) at 12/02/2020 1145 Last data filed at 12/02/2020 0600 Gross per 24 hour  Intake 200.12 ml  Output 50 ml  Net 150.12 ml   Filed Weights   12/01/20 1835  Weight: 49.9 kg    Examination:  General exam: Appears calm and comfortable Respiratory system: Clear to auscultation. Respiratory effort normal. Cardiovascular system: S1 & S2 heard, Fixed split S2, 2/6 systolic murmur Gastrointestinal system: Abdomen is nondistended, soft and nontender. No organomegaly or masses felt. Normal bowel sounds heard. Central nervous system: Alert and oriented to person only. Hard of hearing. Possible aphasia. Strength 4/5 bilaterally. Musculoskeletal: No edema. No calf tenderness Skin: No cyanosis. No rashes Psychiatry: Judgement and insight appear normal. Mood & affect appropriate.     Data Reviewed: I have personally reviewed following labs and imaging studies  CBC Lab Results  Component Value Date   WBC 8.6 12/02/2020   RBC 3.89 12/02/2020   HGB 12.3 12/02/2020   HCT 38.4 12/02/2020   MCV 98.7 12/02/2020   MCH 31.6 12/02/2020   PLT 199 12/02/2020   MCHC 32.0 12/02/2020   RDW 14.9  12/02/2020   LYMPHSABS 1.0 12/01/2020   MONOABS 0.8 12/01/2020   EOSABS 0.1 12/01/2020   BASOSABS 0.1 50/38/8828     Last metabolic panel Lab Results  Component Value Date   NA 142 12/02/2020   K 4.5 12/02/2020   CL 106 12/02/2020    CO2 26 12/02/2020   BUN 29 (H) 12/02/2020   CREATININE 1.82 (H) 12/02/2020   GLUCOSE 85 12/02/2020   GFRNONAA 25 (L) 12/02/2020   GFRAA 31 (L) 08/16/2019   CALCIUM 8.6 (L) 12/02/2020   PROT 6.0 (L) 12/02/2020   ALBUMIN 3.2 (L) 12/02/2020   LABGLOB 2.3 12/31/2018   AGRATIO 1.7 12/31/2018   BILITOT 1.1 12/02/2020   ALKPHOS 76 12/02/2020   AST 15 12/02/2020   ALT 9 12/02/2020   ANIONGAP 10 12/02/2020    CBG (last 3)  Recent Labs    12/01/20 2344 12/02/20 0345 12/02/20 0811  GLUCAP 100* 95 88     GFR: Estimated Creatinine Clearance: 13.3 mL/min (A) (by C-G formula based on SCr of 1.82 mg/dL (H)).  Coagulation Profile: Recent Labs  Lab 12/01/20 1851  INR 1.2    Recent Results (from the past 240 hour(s))  Resp Panel by RT-PCR (Flu A&B, Covid) Nasopharyngeal Swab     Status: None   Collection Time: 12/01/20  8:36 PM   Specimen: Nasopharyngeal Swab; Nasopharyngeal(NP) swabs in vial transport medium  Result Value Ref Range Status   SARS Coronavirus 2 by RT PCR NEGATIVE NEGATIVE Final    Comment: (NOTE) SARS-CoV-2 target nucleic acids are NOT DETECTED.  The SARS-CoV-2 RNA is generally detectable in upper respiratory specimens during the acute phase of infection. The lowest concentration of SARS-CoV-2 viral copies this assay can detect is 138 copies/mL. A negative result does not preclude SARS-Cov-2 infection and should not be used as the sole basis for treatment or other patient management decisions. A negative result may occur with  improper specimen collection/handling, submission of specimen other than nasopharyngeal swab, presence of viral mutation(s) within the areas targeted by this assay, and inadequate number of viral copies(<138 copies/mL). A negative result must be combined with clinical observations, patient history, and epidemiological information. The expected result is Negative.  Fact Sheet for Patients:   EntrepreneurPulse.com.au  Fact Sheet for Healthcare Providers:  IncredibleEmployment.be  This test is no t yet approved or cleared by the [place] FDA and  has been authorized for detection and/or diagnosis of SARS-CoV-2 by FDA under an Emergency Use Authorization (EUA). This EUA will remain  in effect (meaning this test can be used) for the duration of the COVID-19 declaration under Section 564(b)(1) of the Act, 21 U.S.C.section 360bbb-3(b)(1), unless the authorization is terminated  or revoked sooner.       Influenza A by PCR NEGATIVE NEGATIVE Final   Influenza B by PCR NEGATIVE NEGATIVE Final    Comment: (NOTE) The Xpert Xpress SARS-CoV-2/FLU/RSV plus assay is intended as an aid in the diagnosis of influenza from Nasopharyngeal swab specimens and should not be used as a sole basis for treatment. Nasal washings and aspirates are unacceptable for Xpert Xpress SARS-CoV-2/FLU/RSV testing.  Fact Sheet for Patients: EntrepreneurPulse.com.au  Fact Sheet for Healthcare Providers: IncredibleEmployment.be  This test is not yet approved or cleared by the [place] FDA and has been authorized for detection and/or diagnosis of SARS-CoV-2 by FDA under an Emergency Use Authorization (EUA). This EUA will remain in effect (meaning this test can be used) for the duration of the COVID-19 declaration under Section 564(b)(1)  of the Act, 21 U.S.C. section 360bbb-3(b)(1), unless the authorization is terminated or revoked.  Performed at [facility], [place] [address] [place], [place] [address]         Radiology Studies: DG Neck Soft Tissue  Result Date: 12/01/2020 CLINICAL DATA:  Difficulty swallowing, lethargy EXAM: NECK SOFT TISSUES - 1+ VIEW COMPARISON:  None. FINDINGS: Frontal and lateral views of the soft tissues of the neck are obtained. Prevertebral soft tissues appear unremarkable.  Epiglottis is normal. The airway is patent. Surgical clips are seen in the right supraclavicular region. No acute bony abnormalities. Multilevel cervical facet hypertrophy. Mild anterolisthesis of C5 on C6 likely due to degenerative changes. IMPRESSION: 1. Unremarkable soft tissues of the neck. 2. Multilevel cervical degenerative changes. Electronically Signed   By: Randa Ngo M.D.   On: 12/01/2020 20:03   CT Head Wo Contrast  Result Date: 11/30/2020 CLINICAL DATA:  Unknown cause, mental status change. EXAM: CT HEAD WITHOUT CONTRAST TECHNIQUE: Contiguous axial images were obtained from the base of the skull through the vertex without intravenous contrast. COMPARISON:  CT head 06/18/2019 FINDINGS: Brain: Cerebral ventricle sizes are concordant with the degree of cerebral volume loss. Patchy and confluent areas of decreased attenuation are noted throughout the deep and periventricular white matter of the cerebral hemispheres bilaterally, compatible with chronic microvascular ischemic disease. Chronic left occipital infarction. No evidence of large-territorial acute infarction. No parenchymal hemorrhage. No mass lesion. No extra-axial collection. No mass effect or midline shift. No hydrocephalus. Basilar cisterns are patent. Vascular: No hyperdense vessel. Atherosclerotic calcifications are present within the cavernous internal carotid arteries. Skull: No acute fracture or focal lesion. Sinuses/Orbits: Paranasal sinuses and mastoid air cells are clear. Bilateral lens replacement. Otherwise orbits are unremarkable. Other: None. IMPRESSION: No acute intracranial abnormality. Electronically Signed   By: Iven Finn M.D.   On: 11/30/2020 22:44   MR ANGIO HEAD WO CONTRAST  Result Date: 12/02/2020 CLINICAL DATA:  87 year old female with neurologic deficit. MRI yesterday reveals several punctate cortical infarcts in the posterior right MCA territory. EXAM: MRA HEAD WITHOUT CONTRAST TECHNIQUE: Angiographic  images of the Circle of Willis were obtained using MRA technique without intravenous contrast. COMPARISON:  Brain MRI 12/01/2020. FINDINGS: Antegrade flow in the posterior circulation with dominant distal right vertebral artery. PICA origins appear patent. MOTSA artifact in the V4 segments but no distal vertebral stenosis suspected. Patent vertebrobasilar junction. Tortuous basilar artery without stenosis. Patent SCA and PCA origins. Posterior communicating arteries are diminutive or absent. Bilateral PCA branches are within normal limits. Antegrade flow in both ICA siphons. Tortuous left ICA below the skull base at the C1 level. No siphon stenosis. Mild siphon irregularity. Patent carotid termini, MCA and ACA origins. Diminutive or absent anterior communicating artery. Visible ACA branches are within normal limits. MCA M1 segments and MCA bifurcations appear patent without stenosis. Visible bilateral MCA branches are within normal limits. IMPRESSION: Negative for age intracranial MRA. Electronically Signed   By: Genevie Ann M.D.   On: 12/02/2020 10:42   MR BRAIN WO CONTRAST  Result Date: 12/01/2020 CLINICAL DATA:  Initial evaluation for neuro deficit, stroke suspected. EXAM: MRI HEAD WITHOUT CONTRAST TECHNIQUE: Multiplanar, multiecho pulse sequences of the brain and surrounding structures were obtained without intravenous contrast. COMPARISON:  Prior head CT from 11/30/2020 FINDINGS: Brain: Diffuse prominence of the CSF containing spaces compatible with generalized cerebral atrophy, most pronounced at the anterior temporal lobes bilaterally. Patchy and confluent T2/FLAIR hyperintensity within the periventricular and deep white matter both cerebral  hemispheres as well as the pons, most consistent with chronic small vessel ischemic disease, mild for age. Area of encephalomalacia and gliosis with associated chronic hemosiderin staining at the left parieto-occipital region consistent with a chronic ischemic infarct.  Additional few scattered small remote bilateral cerebellar infarcts noted as well. Associated chronic hemosiderin staining noted at the inferior left cerebellum as well. Small remote lacunar infarcts at the right basal ganglia and left thalamus. 7 mm focus of restricted diffusion seen involving the right parietal cortex, postcentral gyrus (series 5, image 92). An additional subtle punctate focus of diffusion abnormality noted slightly superiorly at the right parietal cortex as well (series 5, image 95). Findings consistent with small acute ischemic infarcts, likely embolic. No associated hemorrhage or mass effect. No other evidence for acute or subacute ischemia. Gray-white matter differentiation otherwise maintained. No other areas of remote cortical infarction. No other evidence for acute or chronic intracranial hemorrhage. No mass lesion, midline shift or mass effect. Mild ventricular prominence related to global parenchymal volume loss without hydrocephalus. No extra-axial fluid collection. Note made of an empty sella.  Midline structures intact. Vascular: Major intracranial vascular flow voids are maintained. Skull and upper cervical spine: Craniocervical junction within normal limits. Bone marrow signal intensity normal. No focal marrow replacing lesion. No scalp soft tissue abnormality. Sinuses/Orbits: Patient status post bilateral ocular lens replacement. Globes and orbital soft tissues demonstrate no acute finding. Paranasal sinuses are clear. No significant mastoid effusion. Other: None. IMPRESSION: 1. Few subcentimeter ischemic infarcts measuring up to 7 mm involving the high posterior right parietal cortex as above. No associated hemorrhage or mass effect. 2. No other acute intracranial abnormality. 3. Underlying age-related cerebral atrophy with mild chronic small vessel ischemic disease, with multiple remote lacunar infarcts as detailed above. Electronically Signed   By: Jeannine Boga M.D.   On:  12/01/2020 23:07   DG Chest Port 1 View  Result Date: 12/01/2020 CLINICAL DATA:  Sepsis, difficulty swallowing, lethargy EXAM: PORTABLE CHEST 1 VIEW COMPARISON:  11/30/2020 at 8:55 p.m. FINDINGS: Single frontal view of the chest demonstrates a stable cardiac silhouette with aortic valve prosthesis. Patchy consolidation primarily at the lung bases unchanged since previous exam. No large effusion or pneumothorax. No acute bony abnormality. IMPRESSION: 1. Stable patchy bilateral airspace disease which may reflect edema or infection. Electronically Signed   By: Randa Ngo M.D.   On: 12/01/2020 20:01   DG Chest Port 1 View  Result Date: 11/30/2020 CLINICAL DATA:  Syncope, unresponsive. EXAM: PORTABLE CHEST 1 VIEW COMPARISON:  Chest x-rays dated 06/17/2020 and 11/09/2018. FINDINGS: Stable cardiomegaly. Patchy bilateral airspace opacities. No pleural effusion or pneumothorax is seen. Osseous structures about the chest are unremarkable. Lucency under the LEFT hemidiaphragm, presumably within stomach/bowel but free intraperitoneal air cannot be confidently excluded. IMPRESSION: 1. Patchy bilateral airspace opacities. Differential includes multifocal pneumonia, aspiration and pulmonary edema. 2. Stable cardiomegaly. 3. Lucency under the LEFT hemidiaphragm, presumably within stomach/bowel but free intraperitoneal air cannot be confidently excluded. Recommend further characterization with plain film of the abdomen and/or CT. Electronically Signed   By: Franki Cabot M.D.   On: 11/30/2020 21:24   VAS US CAROTID (at [facility] and [facility] only)  Result Date: 12/02/2020 Carotid Arterial Duplex Study Patient Name:  KINDSEY EBLIN  Date of Exam:   12/02/2020 Medical Rec #: [id number]         Accession #:    [id number] Date of Birth: 1921-02-06        Patient Gender: F Patient Age:  89H Exam Location:  [facility] Procedure:      VAS US CAROTID Referring Phys: [id number]  --------------------------------------------------------------------------------  Indications:       CVA. Risk Factors:      Diabetes, coronary artery disease, prior CVA. Comparison Study:  no prior Performing Technologist: Abram Sander RVS  Examination Guidelines: A complete evaluation includes B-mode imaging, spectral Doppler, color Doppler, and power Doppler as needed of all accessible portions of each vessel. Bilateral testing is considered an integral part of a complete examination. Limited examinations for reoccurring indications may be performed as noted.  Right Carotid Findings: +----------+--------+--------+--------+------------------+--------+           PSV cm/sEDV cm/sStenosisPlaque DescriptionComments +----------+--------+--------+--------+------------------+--------+ CCA Prox  44      5               heterogenous               +----------+--------+--------+--------+------------------+--------+ CCA Distal62      9               heterogenous               +----------+--------+--------+--------+------------------+--------+ ICA Prox  63      12      1-39%   heterogenous               +----------+--------+--------+--------+------------------+--------+ ICA Distal70      11                                         +----------+--------+--------+--------+------------------+--------+ ECA       60                                                 +----------+--------+--------+--------+------------------+--------+ +----------+--------+-------+--------+-------------------+           PSV cm/sEDV cmsDescribeArm Pressure (mmHG) +----------+--------+-------+--------+-------------------+ [id number]                                         +----------+--------+-------+--------+-------------------+ +---------+--------+--+--------+---------+ VertebralPSV cm/s49EDV cm/sAntegrade +---------+--------+--+--------+---------+  Left Carotid Findings:  +----------+--------+--------+--------+------------------+--------+           PSV cm/sEDV cm/sStenosisPlaque DescriptionComments +----------+--------+--------+--------+------------------+--------+ CCA Prox  71      12              heterogenous               +----------+--------+--------+--------+------------------+--------+ CCA Distal72      8               heterogenous               +----------+--------+--------+--------+------------------+--------+ ICA Prox  72      10      1-39%   heterogenous               +----------+--------+--------+--------+------------------+--------+ ICA Distal78      9                                          +----------+--------+--------+--------+------------------+--------+ ECA       93                                                 +----------+--------+--------+--------+------------------+--------+ +----------+--------+--------+--------+-------------------+  PSV cm/sEDV cm/sDescribeArm Pressure (mmHG) +----------+--------+--------+--------+-------------------+ [id number]                                          +----------+--------+--------+--------+-------------------+ +---------+--------+--+--------+---------+ VertebralPSV cm/s81EDV cm/sAntegrade +---------+--------+--+--------+---------+   Summary: Right Carotid: Velocities in the right ICA are consistent with a 1-39% stenosis. Left Carotid: Velocities in the left ICA are consistent with a 1-39% stenosis. Vertebrals: Bilateral vertebral arteries demonstrate antegrade flow. *See table(s) above for measurements and observations.     Preliminary         Scheduled Meds: .  stroke: mapping our early stages of recovery book   Does not apply Once  . insulin aspart  0-6 Units Subcutaneous Q4H   Continuous Infusions: . sodium chloride 50 mL/hr at 12/02/20 0326  . azithromycin 500 mg (12/02/20 1022)  . cefTRIAXone (ROCEPHIN)  IV 1 g (12/02/20 0754)  .  heparin 600 Units/hr (12/02/20 0326)     LOS: 0 days     Cordelia Poche, MD [facility] 12/02/2020, 11:45 AM  If 7PM-7AM, please contact night-coverage [email or web address]

## 2020-12-02 NOTE — Progress Notes (Addendum)
Pharmacy Antibiotic Note  Alexa Mcdonald is a 87 y.o. female admitted on 12/01/2020 with pneumonia.  Pharmacy has been consulted for Unasyn dosing. Amoxicillin allergy noted- "tongue felt bruised" that occurred as a child.  Has tolerated cephalosporins in past.  Change to Rocephin/Zithromax per Dr Hal Hope  Plan: Rocephin 1gm IV q24h Zithromax 500mg  IV q24h No renal adjustment needed- pharmacy will sign off   Height: 5\' 1"  (154.9 cm) Weight: 49.9 kg (110 lb 0.2 oz) IBW/kg (Calculated) : 47.8  Temp (24hrs), Avg:97.9 F (36.6 C), Min:97.7 F (36.5 C), Max:98.2 F (36.8 C)  Recent Labs  Lab 11/30/20 2032 11/30/20 2232 12/01/20 1851 12/01/20 2051  WBC 12.6*  --  10.7*  --   CREATININE 1.84*  --  2.22*  --   LATICACIDVEN 2.7* 2.6* 1.7 1.4    Estimated Creatinine Clearance: 10.9 mL/min (A) (by C-G formula based on SCr of 2.22 mg/dL (H)).    Allergies  Allergen Reactions  . Amoxicillin Other (See Comments)    tongue felt bruised Did it involve swelling of the face/tongue/throat, SOB, or low BP? No Did it involve sudden or severe rash/hives, skin peeling, or any reaction on the inside of your mouth or nose? No Did you need to seek medical attention at a hospital or doctor's office? No When did it last happen?childhood If all above answers are "NO", may proceed with cephalosporin use.    . Celebrex [Celecoxib] Other (See Comments)    Mouth sores  . Amoxicillin Other (See Comments)    "Made my tongue felt bruised"  . Celecoxib Other (See Comments)    Made the mouth sore    Antimicrobials this admission: 5/1 Rocephin/Zithromax  Dose adjustments this admission:  Microbiology results: 4/30 BCx:  4/30 Resp PCR: negative PCR  Thank you for allowing pharmacy to be a part of this patient's care.  Netta Cedars PharmD 12/02/2020 5:53 AM

## 2020-12-02 NOTE — Consult Note (Addendum)
Neurology Consultation Reason for Consult: new strokes Requesting Physician: nettey  CC: dysphagia  History is obtained from:patient chart, nursing staff.   HPI: Alexa Mcdonald is a 87 y.o. female  With history of chronic atrial fibrillation, diabetes mellitus, hyperthyroidism who was brought to the ED from her SNF for difficulty swallowing. Please note that she was brought to the ED earlier the day before for unresponsive episode and was discharged back to facility after observation and family didn't want admission. Patient has been diminishing physically recently, per nursing report.   Patient was admitted for further workup of dysphagia/odynophagia unknown cause. As part of her workup, a brain MRI was done 4/30 showing DWI changes at right parietal cortex, postcentral gyrus, and additional punctate DWI abnormality along right parietal cortex as well: consistent with embolic appearing small acute ischemic infarcts. MRA head done this morning 12/02/20 showed no acute significant cerebral vascular abnormalities.  She is on eliquis 2.5mg  bid for known a fib.  Stroke neurology was consulted regarding new infarcts, and we thank Dr. Lonny Prude for the kind referral.   LKW:  unclear tPA given?: No,  Premorbid modified rankin scale: 4      0 - No symptoms.     1 - No significant disability. Able to carry out all usual activities, despite some symptoms.     2 - Slight disability. Able to look after own affairs without assistance, but unable to carry out all previous activities.     3 - Moderate disability. Requires some help, but able to walk unassisted.     4 - Moderately severe disability. Unable to attend to own bodily needs without assistance, and unable to walk unassisted.     5 - Severe disability. Requires constant nursing care and attention, bedridden, incontinent.     6 - Dead.   ROS:   Unable to obtain due to altered mental status.   Past Medical History:  Diagnosis Date  . 1st degree  AV block   . Bradycardia   . Chronic diastolic heart failure ([place]) 02/07/2016  . CKD (chronic kidney disease), stage III ([place])   . Colon, diverticulosis   . Diabetes mellitus    diet controlled  . Essential hypertension   . GERD (gastroesophageal reflux disease)   . Hematochezia   . HOH (hard of hearing)    wears bilateral hearing aids  . Hx of orthostatic hypotension   . Interstitial lung disease ([place])   . LBBB (left bundle branch block)   . Long term current use of anticoagulant therapy   . Mediastinal goiter     recurrent s/p resection 1988  . Mild CAD   . Mitral regurgitation   . Osteopenia   . Osteoporosis   . Paroxysmal atrial fibrillation ([facility])    a. CHA2DS2VASc = 5-->coumadin;  b. previously on amio->d/c'd 11/2014 secondary to concern for possible amio lung.  . Pneumonia   . 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 Severe aortic stenosis    s/p TAVR with stable prosthesis by echo 08/2019 - mean AVG 12mmHg  . Stroke ([place])   . Tachycardia-bradycardia ([place])   . Vitamin D deficiency       Family History  Problem Relation Age of Onset  . Diabetes Mother   . Cancer - Prostate Father   . Pulmonary embolism Father   . Hypertension Sister   . Diabetes Sister   .  Hypertension Brother   . Arrhythmia Brother        afib  . CAD Brother   . Pulmonary fibrosis Brother   . Colon cancer Neg Hx   . Esophageal cancer Neg Hx   . Inflammatory bowel disease Neg Hx   . Liver disease Neg Hx   . Pancreatic cancer Neg Hx   . Stomach cancer Neg Hx   . Rectal cancer Neg Hx       Social History:  reports that she has never smoked. She has never used smokeless tobacco. She reports previous alcohol use. She reports that she does not use drugs.    Exam: Current vital signs: BP (!) 147/64 (BP Location: Left Arm)   Pulse (!) 51   Temp 97.9 F (36.6 C) (Oral)    Resp 15   Ht 5\' 1"  (1.549 m)   Wt 49.9 kg   SpO2 96%   BMI 20.79 kg/m  Vital signs in last 24 hours: Temp:  [97.7 F (36.5 C)-98.2 F (36.8 C)] 97.9 F (36.6 C) (05/01 1357) Pulse Rate:  [51-60] 51 (05/01 1357) Resp:  [9-18] 15 (05/01 1357) BP: (128-161)/(43-67) 147/64 (05/01 1357) SpO2:  [93 %-96 %] 96 % (05/01 1357) Weight:  [49.9 kg] 49.9 kg (04/30 1835)   Physical Exam  Constitutional: Appears well-developed and well-nourished.  Psych: Affect appropriate to situation,    Eyes: No scleral injection HENT: No oropharyngeal obstruction.  MSK: no joint deformities.  Cardiovascular: Normal rate and regular rhythm.  Respiratory: Effort normal, non-labored breathing GI: Soft.  No distension. There is no tenderness.  Skin: Warm dry and intact visible skin  Neuro: Mental Status: Patient is awake, alert, oriented to person only Patient is not  able to give a clear and coherent history.  Mild dysarthria No signs of aphasia or neglect   Cranial Nerves: II: Visual Fields are full. Pupils are equal, round, and reactive to light.    III,IV, VI: EOMI without ptosis or diploplia.  V: Facial sensation is symmetric to temperature VII: Facial movement is symmetric.  VIII: hearing is intact to voice X: Uvula elevates symmetrically XI: Shoulder shrug is symmetric. XII: tongue is midline without atrophy or fasciculations.   Motor: Tone is normal. Bulk is normal. 4/5 strength was present in all four extremities.   Sensory: Sensation is symmetric to light touch and temperature in the arms and legs.  Deep Tendon Reflexes: 2+ and symmetric in the biceps and patellae.   Plantars: Toes are downgoing bilaterally.   Cerebellar: Not able to be tested   NIHSS = 7 (2 questions, 1 drift in each extremity, mild dysarthria)  I have reviewed labs in epic and the results pertinent to this consultation are:  Results for Alexa Mcdonald, Alexa Mcdonald (MRN [id number]) as of 12/02/2020 16:50  Ref. Range  12/02/2020 06:03  Sodium Latest Ref Range: 135 - 145 mmol/L 142  Potassium Latest Ref Range: 3.5 - 5.1 mmol/L 4.5  Chloride Latest Ref Range: 98 - 111 mmol/L 106  CO2 Latest Ref Range: 22 - 32 mmol/L 26  Glucose Latest Ref Range: 70 - 99 mg/dL 85  BUN Latest Ref Range: 8 - 23 mg/dL 29 (H)  Creatinine Latest Ref Range: 0.44 - 1.00 mg/dL 1.82 (H)  Calcium Latest Ref Range: 8.9 - 10.3 mg/dL 8.6 (L)  Anion gap Latest Ref Range: 5 - 15  10  Alkaline Phosphatase Latest Ref Range: 38 - 126 U/L 76  Albumin Latest Ref Range: 3.5 - 5.0 g/dL 3.2 (  L)  AST Latest Ref Range: 15 - 41 U/L 15  ALT Latest Ref Range: 0 - 44 U/L 9  Total Protein Latest Ref Range: 6.5 - 8.1 g/dL 6.0 (L)  Total Bilirubin Latest Ref Range: 0.3 - 1.2 mg/dL 1.1  GFR, Estimated Latest Ref Range: >60 mL/min 25 (L)  Total CHOL/HDL Ratio Latest Units: RATIO 3.3  Cholesterol Latest Ref Range: 0 - 200 mg/dL 117  HDL Cholesterol Latest Ref Range: >40 mg/dL 35 (L)  LDL (calc) Latest Ref Range: 0 - 99 mg/dL 70  Triglycerides Latest Ref Range: <150 mg/dL 59  VLDL Latest Ref Range: 0 - 40 mg/dL 12  WBC Latest Ref Range: 4.0 - 10.5 K/uL 8.6  RBC Latest Ref Range: 3.87 - 5.11 MIL/uL 3.89  Hemoglobin Latest Ref Range: 12.0 - 15.0 g/dL 12.3  HCT Latest Ref Range: 36.0 - 46.0 % 38.4  MCV Latest Ref Range: 80.0 - 100.0 fL 98.7  MCH Latest Ref Range: 26.0 - 34.0 pg 31.6  MCHC Latest Ref Range: 30.0 - 36.0 g/dL 32.0  RDW Latest Ref Range: 11.5 - 15.5 % 14.9  Platelets Latest Ref Range: 150 - 400 K/uL 199  nRBC Latest Ref Range: 0.0 - 0.2 % 0.0    I have reviewed the images obtained:  MRA head 12/02/20 Negative for age intracranial MRA.  MRI brain without contrast 12/01/20 7 mm focus of restricted diffusion seen involving the right parietal cortex, postcentral gyrus (series 5, image 92). An additional subtle punctate focus of diffusion abnormality noted slightly superiorly at the right parietal cortex as well (series 5, image 95).  Findings consistent with small acute ischemic infarcts, likely embolic. No associated hemorrhage or mass effect.  Impression:   87 year old white female, a SNF resident, admitted into hospital for evaluation of dysphagia. Found to have embolic appearing infarcts at right hemisphere.  Recommendations: Hemoglobin a1cc, fasting lipid panel] Carotid US PT/OT/ST consult Echocardiogram Continue eliquis 2.5mg  bid (stroke is extremely small, risk of recurrent ischemic stroke felt to outweigh risk of hemorrhagic conversion) Risk factor modification Telemetry monitoring Frequent neuro checks per protocol Permissive hypertension x 48 hours of admission. Use protocol to return to normotension. Unlikely to benefit from statin therapy due to advanced age. We will following along. Thank you for permission to participate in her care.   Note written by Claiborne Billings PA and edited by me to reflect my findings and recommendations.  Su Monks, MD [facility] [phone]  If 7pm- 7am, please page neurology on call as listed in [place].

## 2020-12-02 NOTE — Progress Notes (Signed)
ANTICOAGULATION CONSULT NOTE - Initial Consult  Pharmacy Consult for Heparin Indication: atrial fibrillation  Allergies  Allergen Reactions  . Amoxicillin Other (See Comments)    tongue felt bruised Did it involve swelling of the face/tongue/throat, SOB, or low BP? No Did it involve sudden or severe rash/hives, skin peeling, or any reaction on the inside of your mouth or nose? No Did you need to seek medical attention at a hospital or doctor's office? No When did it last happen?childhood If all above answers are "NO", may proceed with cephalosporin use.    . Celebrex [Celecoxib] Other (See Comments)    Mouth sores  . Amoxicillin Other (See Comments)    "Made my tongue felt bruised"  . Celecoxib Other (See Comments)    Made the mouth sore    Patient Measurements: Height: 5\' 1"  (154.9 cm) Weight: 49.9 kg (110 lb 0.2 oz) IBW/kg (Calculated) : 47.8 Heparin Dosing Weight: 49.9  Vital Signs: Temp: 97.9 F (36.6 C) (05/01 1357) Temp Source: Oral (05/01 1357) BP: 147/64 (05/01 1357) Pulse Rate: 51 (05/01 1357)  Labs: Recent Labs    11/30/20 2032 11/30/20 2232 12/01/20 1851 12/01/20 2340 12/02/20 0603 12/02/20 1232  HGB 13.1  --  14.2  --  12.3  --   HCT 40.3  --  44.7  --  38.4  --   PLT 211  --  229  --  199  --   APTT  --   --  32  --  58* 52*  LABPROT  --   --  15.6*  --   --   --   INR  --   --  1.2  --   --   --   HEPARINUNFRC  --   --   --  1.05* 1.10* 0.85*  CREATININE 1.84*  --  2.22*  --  1.82*  --   TROPONINIHS 14 13  --   --   --   --     Estimated Creatinine Clearance: 13.3 mL/min (A) (by C-G formula based on SCr of 1.82 mg/dL (H)).   Medical History: Past Medical History:  Diagnosis Date  . 1st degree AV block   . Bradycardia   . Chronic diastolic heart failure ([place]) 02/07/2016  . CKD (chronic kidney disease), stage III ([place])   . Colon, diverticulosis   . Diabetes mellitus    diet controlled  . Essential hypertension   . GERD  (gastroesophageal reflux disease)   . Hematochezia   . HOH (hard of hearing)    wears bilateral hearing aids  . Hx of orthostatic hypotension   . Interstitial lung disease ([place])   . LBBB (left bundle branch block)   . Long term current use of anticoagulant therapy   . Mediastinal goiter     recurrent s/p resection 1988  . Mild CAD   . Mitral regurgitation   . Osteopenia   . Osteoporosis   . Paroxysmal atrial fibrillation ([facility])    a. CHA2DS2VASc = 5-->coumadin;  b. previously on amio->d/c'd 11/2014 secondary to concern for possible amio lung.  . Pneumonia   . 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 Severe aortic stenosis    s/p TAVR with stable prosthesis by echo 08/2019 - mean AVG 59mmHg  . Stroke ([place])   . Tachycardia-bradycardia ([place])   . Vitamin D deficiency     Medications:  Eliquis  2.5mg  PO BID PTA- LD 4/29 @ 0900  Assessment: 87 yo F presenting from nursing facility with inability to swallow.  Her home medications include Eliquis for Afib which she has been unable to take since yesterday morning.   Pharmacy has been consulted to start IV heparin while Eliquis on hold.   Baseline aPTT, PT/INR, CBC WNL.  Scr elevated at 2.22 Baseline heparin level 1.05 - elevated due to recent Eliquis dose and poor renal function.  Plan to monitor IV heparin using aptt until levels correlate.   This afternoon,  APTT subtherapeutic at 52 with heparin infusing at 600 units/hr No infusion related issues and no bleeding reported  Goal of Therapy:  APTT 66-102 sec Heparin level 0.3-0.7 units/ml Monitor platelets by anticoagulation protocol: Yes   Plan:  Increase heparin infusion to 700 units/hr Check aPTT 8 hrs after rate change Daily heparin level, CBC, and aPTT while on heparin F/U ability to resume Eliquis  Gionna Polak P. Legrand Como, PharmD, [place] Please utilize [facility] for appropriate phone number to reach the unit pharmacist ([place]) 12/02/2020 2:05 PM

## 2020-12-02 NOTE — Progress Notes (Signed)
IV consult placed for a second IV site and pt is only receiving NS and Heparin, RN notified that they are compatible.

## 2020-12-02 NOTE — Evaluation (Signed)
Occupational Therapy Evaluation Patient Details Name: Alexa Mcdonald MRN: [id number] DOB: 07-19-1921 Today's Date: 12/02/2020    History of Present Illness Alexa Mcdonald is a 87 y.o. female with history of chronic A. fib, diabetes mellitus, hyperthyroidism was brought to the ER after patient had difficulty swallowing, pt's son reports increase in general weakness.   Clinical Impression   Pt admitted with the above diagnoses and presents with below problem list. Pt will benefit from continued acute OT to address the below listed deficits and maximize independence with basic ADLs prior to d/c to venue below. PTA pt was residing in ALF, pt unable to provide PLOF data and no family present on eval. Pt completed bed mobility with min-mod A. Pt sat EOB a few minutes then stood and side stepped along EOB all at at min guard level. Recommend having +2 assist for safety to progress mobility beyond pivotal steps. Pt tolerated session fairly well, quickly fell back asleep at end of session.     Follow Up Recommendations  Other (comment) (back to ALF pending progress otherwise may need ST SNF)    Equipment Recommendations  None recommended by OT    Recommendations for Other Services       Precautions / Restrictions Precautions Precautions: Fall Restrictions Weight Bearing Restrictions: No      Mobility Bed Mobility Overal bed mobility: Needs Assistance Bed Mobility: Supine to Sit;Sit to Supine     Supine to sit: Mod assist;HOB elevated Sit to supine: Mod assist;Min assist   General bed mobility comments: Assist to initiate movements 2/2 decreased initiation. Assist to power up trunk and pivot hips to full EOB position. Assist to control descent and fully advance BLE up onto bed at end of session.    Transfers Overall transfer level: Needs assistance Equipment used: Rolling walker (2 wheeled) Transfers: Sit to/from Stand Sit to Stand: Min guard;From elevated surface          General transfer comment: from slightly elevated bed height. +2 assist available but ultimately pt needed only min guard assist. Verbal and tactile cues needed to initate.    Balance Overall balance assessment: Needs assistance Sitting-balance support: Single extremity supported;Feet supported Sitting balance-Leahy Scale: Fair Sitting balance - Comments: able to comb hair a bit seated EOB otherwise maintained BUE support.   Standing balance support: Bilateral upper extremity supported Standing balance-Leahy Scale: Poor Standing balance comment: external support for static standing                           ADL either performed or assessed with clinical judgement   ADL Overall ADL's : Needs assistance/impaired Eating/Feeding: Set up;Sitting   Grooming: Sitting;Moderate assistance Grooming Details (indicate cue type and reason): able to brush hair a bit with setup. mod A for throughness 2/2 cognition and weakness Upper Body Bathing: Moderate assistance;Sitting   Lower Body Bathing: Moderate assistance;Sit to/from stand   Upper Body Dressing : Moderate assistance;Sitting   Lower Body Dressing: Moderate assistance;Sit to/from stand   Toilet Transfer: Min Geophysical data processor Details (indicate cue type and reason): stand-pivot to [facility] Toileting- Clothing Manipulation and Hygiene: Moderate assistance;Sitting/lateral lean;Sit to/from stand       Functional mobility during ADLs: Min guard;Rolling walker;+2 for safety/equipment (+2 for safety to progress ambulation beyond pivotal steps.) General ADL Comments: Pt completed sit<>stand transfers and sidestepped along EOB at min guard level. Assist for ADL task 2/2 cognition, impaired dynamic balance, and weakness.  Vision         Perception     Praxis      Pertinent Vitals/Pain Pain Assessment: Faces Faces Pain Scale: Hurts even more Pain Location: LLE pain in the area of her calf muscles to  touch Pain Descriptors / Indicators: Grimacing;Guarding Pain Intervention(s): Monitored during session;Limited activity within patient's tolerance;Repositioned     Hand Dominance Right   Extremity/Trunk Assessment Upper Extremity Assessment Upper Extremity Assessment: Generalized weakness;Difficult to assess due to impaired cognition   Lower Extremity Assessment Lower Extremity Assessment: Defer to PT evaluation;Generalized weakness   Cervical / Trunk Assessment Cervical / Trunk Assessment: Kyphotic   Communication Communication Communication: HOH;Expressive difficulties;Other (comment) (word salad? very tangential responses. very HOH.)   Cognition Arousal/Alertness: Awake/alert;Lethargic (quick to fall asleep in supine) Behavior During Therapy: Flat affect;WFL for tasks assessed/performed Overall Cognitive Status: History of cognitive impairments - at baseline                                 General Comments: tangential responses. Decreased inititation, Intellectual awareness. Not oriented to place, time, or situation.   General Comments       Exercises     Shoulder Instructions      Home Living Family/patient expects to be discharged to:: Assisted living                                 Additional Comments: Per chart review, pt is from [facility].      Prior Functioning/Environment          Comments: Unclear what here assist level is at baseline with basic ADLs and mobility. Pt is unable to provide history and no family present.        OT Problem List: Impaired balance (sitting and/or standing);Decreased strength;Decreased activity tolerance;Decreased cognition;Decreased knowledge of use of DME or AE;Decreased knowledge of precautions;Pain      OT Treatment/Interventions: Self-care/ADL training;Energy conservation;DME and/or AE instruction;Therapeutic activities;Patient/family education;Balance training;Cognitive  remediation/compensation    OT Goals(Current goals can be found in the care plan section) Acute Rehab OT Goals Patient Stated Goal: did not state OT Goal Formulation: Patient unable to participate in goal setting Time For Goal Achievement: 12/16/20 Potential to Achieve Goals: Good  OT Frequency: Min 2X/week   Barriers to D/C:            Co-evaluation PT/OT/SLP Co-Evaluation/Treatment: Yes Reason for Co-Treatment: For patient/therapist safety   OT goals addressed during session: ADL's and self-care      AM-PAC OT "6 Clicks" Daily Activity     Outcome Measure Help from another person eating meals?: A Little Help from another person taking care of personal grooming?: A Lot Help from another person toileting, which includes using toliet, bedpan, or urinal?: A Lot Help from another person bathing (including washing, rinsing, drying)?: A Lot Help from another person to put on and taking off regular upper body clothing?: A Little Help from another person to put on and taking off regular lower body clothing?: A Lot 6 Click Score: 14   End of Session Equipment Utilized During Treatment: Rolling walker Nurse Communication: Mobility status  Activity Tolerance: Patient limited by fatigue;Patient tolerated treatment well Patient left: in bed;with call bell/phone within reach;with bed alarm set  OT Visit Diagnosis: Unsteadiness on feet (R26.81);Muscle weakness (generalized) (M62.81);Pain;Other symptoms and signs involving cognitive function;Cognitive communication deficit (R41.841)  Time: 1657-9038 OT Time Calculation (min): 30 min Charges:  OT General Charges $OT Visit: 1 Visit OT Evaluation $OT Eval Moderate Complexity: [place], OT Acute Rehabilitation Services Pager: [phone] Office: [phone]   Hortencia Pilar 12/02/2020, 1:28 PM

## 2020-12-02 NOTE — Progress Notes (Signed)
Carotid duplex has been completed.   Preliminary results in CV Proc.   Alexa Mcdonald 12/02/2020 8:52 AM

## 2020-12-02 NOTE — Evaluation (Addendum)
Clinical/Bedside Swallow Evaluation Patient Details  Name: Alexa Mcdonald MRN: [id number] Date of Birth: Sep 29, 1920  Today's Date: 12/02/2020 Time: SLP Start Time (ACUTE ONLY): 1535 SLP Stop Time (ACUTE ONLY): 1550 SLP Time Calculation (min) (ACUTE ONLY): 15 min  Past Medical History:  Past Medical History:  Diagnosis Date  . 1st degree AV block   . Bradycardia   . Chronic diastolic heart failure ([place]) 02/07/2016  . CKD (chronic kidney disease), stage III ([place])   . Colon, diverticulosis   . Diabetes mellitus    diet controlled  . Essential hypertension   . GERD (gastroesophageal reflux disease)   . Hematochezia   . HOH (hard of hearing)    wears bilateral hearing aids  . Hx of orthostatic hypotension   . Interstitial lung disease ([place])   . LBBB (left bundle branch block)   . Long term current use of anticoagulant therapy   . Mediastinal goiter     recurrent s/p resection 1988  . Mild CAD   . Mitral regurgitation   . Osteopenia   . Osteoporosis   . Paroxysmal atrial fibrillation (H[facility])    a. CHA2DS2VASc = 5-->coumadin;  b. previously on amio->d/c'd 11/2014 secondary to concern for possible amio lung.  . Pneumonia   . 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 Severe aortic stenosis    s/p TAVR with stable prosthesis by echo 08/2019 - mean AVG 2mmHg  . Stroke ([place])   . Tachycardia-bradycardia ([place])   . Vitamin D deficiency    Past Surgical History:  Past Surgical History:  Procedure Laterality Date  . BILIARY DILATION  10/24/2018   Procedure: BILIARY DILATION;  Surgeon: Jackquline Denmark, MD;  Location: [facility] ENDOSCOPY;  Service: Endoscopy;;  . BIOPSY  11/11/2018   Procedure: BIOPSY;  Surgeon: Irving Copas., MD;  Location: [facility] ENDOSCOPY;  Service: Gastroenterology;;  . CARDIAC CATHETERIZATION N/A 10/31/2015   Procedure: Right/Left Heart Cath and  Coronary Angiography;  Surgeon: [name], MD;  Location: [place] CV LAB;  Service: Cardiovascular;  Laterality: N/A;  . CARPAL TUNNEL RELEASE Bilateral   . CATARACT EXTRACTION Bilateral   . CESAREAN SECTION     x3  . CHOLECYSTECTOMY N/A 10/25/2018   Procedure: LAPAROSCOPIC CHOLECYSTECTOMY WITH INTRAOPERATIVE CHOLANGIOGRAM;  Surgeon: Kieth Brightly Arta Bruce, MD;  Location: [facility] ORS;  Service: General;  Laterality: N/A;  . ERCP N/A 10/24/2018   Procedure: ENDOSCOPIC RETROGRADE CHOLANGIOPANCREATOGRAPHY (ERCP);  Surgeon: Jackquline Denmark, MD;  Location: [facility];  Service: Endoscopy;  Laterality: N/A;  . ESOPHAGOGASTRODUODENOSCOPY (EGD) WITH PROPOFOL N/A 11/11/2018   Procedure: ESOPHAGOGASTRODUODENOSCOPY (EGD) WITH PROPOFOL;  Surgeon: Rush Landmark Telford Nab., MD;  Location: [place];  Service: Gastroenterology;  Laterality: N/A;  . mediastinal removal of a goiter    . MRI  08/18/07   head (diabetes heart study at [facility])  . REMOVAL OF STONES  10/24/2018   Procedure: REMOVAL OF STONES;  Surgeon: Jackquline Denmark, MD;  Location: [facility];  Service: Endoscopy;;  . Joan Mayans  10/24/2018   Procedure: Joan Mayans;  Surgeon: Jackquline Denmark, MD;  Location: [facility];  Service: Endoscopy;;  . TEE WITHOUT CARDIOVERSION N/A 11/27/2015   Procedure: TRANSESOPHAGEAL ECHOCARDIOGRAM (TEE);  Surgeon: [name], MD;  Location: [place];  Service: Open Heart Surgery;  Laterality: N/A;  . TRANSCATHETER AORTIC VALVE REPLACEMENT, TRANSFEMORAL N/A 11/27/2015   Procedure: TRANSCATHETER AORTIC VALVE REPLACEMENT, TRANSFEMORAL;  Surgeon: [name], MD;  Location: [facility]  OR;  Service: Open Heart Surgery;  Laterality: N/A;   HPI:  Alexa Mcdonald is a 87 y.o. female with advanced dementia presenting from [facility] with dysphagia. MRI showed acute CVA with multiple subcentimeter ischemic infarcts of right high posterior parietal cortex. DG neck soft tissue: Multilevel cervical degenerative changes. Pt had ED  visit 4/20 for unwitnessed fall with scalp laceration, CT head negative.  PMH: chronic A. fib, diabetes mellitus, hyperthyroidism.   Assessment / Plan / Recommendation Clinical Impression  Pt presents with quite functional swallowing and effective airway protection despite advanced dementia. She was alert and pleasantly confused; unable to follow commands.  Pt accepted sips of water from a straw and bites of pudding with adequate oral attention, brisk swallow response, and no s/s of aspiration. She had difficulty with self-feeding (e.g., sucking from a spoon as if it were a straw), and max verbal/tactile cues weren't always sufficient. She masticated a graham cracker after it was placed against her lips with intermittent inattention; however, Alexa Mcdonald was getting sleepy at that point.  Session ended, oral cavity was cleaned and HOB was lowered so that she could rest.  Recommend initiating a dysphagia 2 diet with thin liquids; give meds whole in puree.  SLP will follow briefly for education/safety.  D/W RN.   ** Speech/language assessment not warranted given advanced dementia.   SLP Visit Diagnosis: Dysphagia, unspecified (R13.10)    Aspiration Risk  Mild aspiration risk    Diet Recommendation   dysphagia 2, thin liquids  Medication Administration: Whole meds with puree    Other  Recommendations Oral Care Recommendations: Oral care BID   Follow up Recommendations Skilled Nursing facility      Frequency and Duration min 2x/week  1 week       Prognosis Prognosis for Safe Diet Advancement: Fair      Swallow Study   General Date of Onset: 12/01/20 HPI: Alexa Mcdonald is a 87 y.o. female with advanced dementia presenting from [facility] with dysphagia. MRI showed acute CVA with multiple subcentimeter ischemic infarcts of right high posterior parietal cortex. DG neck soft tissue: Multilevel cervical degenerative changes. Pt had ED visit 4/20 for unwitnessed fall with scalp  laceration, CT head negative.  PMH: chronic A. fib, diabetes mellitus, hyperthyroidism. Type of Study: Bedside Swallow Evaluation Previous Swallow Assessment: no Diet Prior to this Study: NPO Temperature Spikes Noted: No Respiratory Status: Room air History of Recent Intubation: No Behavior/Cognition: Alert;Confused Oral Cavity Assessment: Within Functional Limits Oral Care Completed by SLP: No Oral Cavity - Dentition: Adequate natural dentition Self-Feeding Abilities: Needs assist Patient Positioning: Upright in bed Baseline Vocal Quality: Normal Volitional Cough: Cognitively unable to elicit Volitional Swallow: Unable to elicit    Oral/Motor/Sensory Function Overall Oral Motor/Sensory Function: Other (comment) (unable to follow commands; symmetric at baseline)   Ice Chips Ice chips: Not tested   Thin Liquid Thin Liquid: Within functional limits    Nectar Thick Nectar Thick Liquid: Not tested   Honey Thick Honey Thick Liquid: Not tested   Puree Puree: Within functional limits   Solid     Solid: Within functional limits     Angeli Demilio L. Tivis Ringer, [place] Office number [phone] Pager [phone]  Juan Quam Laurice 12/02/2020,3:57 PM

## 2020-12-02 NOTE — Progress Notes (Signed)
Patient history and assessment completed to the best my ability with information available. Patient is a poor historian and  not able to provide information.

## 2020-12-02 NOTE — Evaluation (Addendum)
Physical Therapy Evaluation Patient Details Name: Alexa Mcdonald MRN: [id number] DOB: 24-Feb-1921 Today's Date: 12/02/2020   History of Present Illness  Alexa Mcdonald is a 87 y.o. female adm for further work-up of dysphagia/odynophagia unknown cause, r/o myasthenia gravis, r/o pna.   PMH: chronic A. fib, diabetes mellitus, hyperthyroidism was brought to the ER after patient had difficulty swallowing, pt's son reports increase in general weakness. MRI negative  Clinical Impression  Pt admitted with above diagnosis.  Unsure of pt's baseline, from [facility] per chart; pt very sleepy  At time of eval, follows one step commands with incr time and multi-modal cues. May need SNF pending progress. Will follow ina cute setting  Pt currently with functional limitations due to the deficits listed below (see PT Problem List). Pt will benefit from skilled PT to increase their independence and safety with mobility to allow discharge to the venue listed below.       Follow Up Recommendations SNF    Equipment Recommendations  None recommended by PT    Recommendations for Other Services       Precautions / Restrictions Precautions Precautions: Fall Restrictions Weight Bearing Restrictions: No      Mobility  Bed Mobility Overal bed mobility: Needs Assistance Bed Mobility: Supine to Sit;Sit to Supine     Supine to sit: Mod assist;HOB elevated Sit to supine: Mod assist;+2 for safety/equipment   General bed mobility comments: Assist to initiate movements 2/2 decreased initiation. Assist to power up trunk and pivot hips to full EOB position. Assist to control descent and fully advance BLE up onto bed at end of session.    Transfers Overall transfer level: Needs assistance Equipment used: Rolling walker (2 wheeled) Transfers: Sit to/from Stand Sit to Stand: Min guard         General transfer comment: from slightly elevated bed height. +2 assist available but ultimately pt needed  only min guard assist. multi-modal  cues needed to initate.  Ambulation/Gait Ambulation/Gait assistance: Min assist Gait Distance (Feet):  (lateral steps along EOB, ~ 6 steps) Assistive device: Rolling walker (2 wheeled)       General Gait Details: assist to balance, multi-modal cues for participation, incr time  Stairs            Wheelchair Mobility    Modified Rankin (Stroke Patients Only)       Balance Overall balance assessment: Needs assistance Sitting-balance support: Single extremity supported;Feet supported Sitting balance-Leahy Scale: Fair Sitting balance - Comments: able to comb hair a bit seated EOB otherwise maintained BUE support.   Standing balance support: Bilateral upper extremity supported Standing balance-Leahy Scale: Poor Standing balance comment: external support for static standing, reliant on UEs                             Pertinent Vitals/Pain Pain Assessment: Faces Faces Pain Scale: Hurts even more Pain Location: LLE pain in the area of her calf muscles to touch (appears to have 2 small wounds) Pain Descriptors / Indicators: Grimacing;Guarding Pain Intervention(s): Limited activity within patient's tolerance;Monitored during session;Repositioned    Home Living Family/patient expects to be discharged to:: Assisted living                 Additional Comments: Per chart review, pt is from [facility].    Prior Function           Comments: Unclear what here assist level is at baseline with basic  ADLs and mobility. Pt is unable to provide history and no family present.     Hand Dominance   Dominant Hand: Right    Extremity/Trunk Assessment   Upper Extremity Assessment Upper Extremity Assessment: Defer to OT evaluation;Generalized weakness    Lower Extremity Assessment Lower Extremity Assessment: Generalized weakness    Cervical / Trunk Assessment Cervical / Trunk Assessment: Kyphotic  Communication    Communication: HOH;Expressive difficulties;Other (comment)  Cognition Arousal/Alertness: Awake/alert;Lethargic (initially very sleepy, falls back asleep easily in supine) Behavior During Therapy: Flat affect;WFL for tasks assessed/performed Overall Cognitive Status: History of cognitive impairments - at baseline                                 General Comments: tangential responses. Decreased inititation, Intellectual awareness. Not oriented to place, time, or situation.      General Comments      Exercises     Assessment/Plan    PT Assessment Patient needs continued PT services  PT Problem List Decreased strength;Decreased range of motion;Decreased activity tolerance;Decreased mobility;Decreased balance;Decreased cognition;Decreased knowledge of use of DME       PT Treatment Interventions DME instruction;Therapeutic exercise;Gait training;Functional mobility training;Therapeutic activities;Patient/family education    PT Goals (Current goals can be found in the Care Plan section)  Acute Rehab PT Goals Patient Stated Goal: did not state PT Goal Formulation: Patient unable to participate in goal setting Time For Goal Achievement: 12/16/20 Potential to Achieve Goals: Good    Frequency Min 2X/week   Barriers to discharge        Co-evaluation   Reason for Co-Treatment: For patient/therapist safety   OT goals addressed during session: ADL's and self-care       AM-PAC PT "6 Clicks" Mobility  Outcome Measure Help needed turning from your back to your side while in a flat bed without using bedrails?: A Little Help needed moving from lying on your back to sitting on the side of a flat bed without using bedrails?: A Little Help needed moving to and from a bed to a chair (including a wheelchair)?: A Lot Help needed standing up from a chair using your arms (e.g., wheelchair or bedside chair)?: A Lot Help needed to walk in hospital room?: A Lot Help needed climbing  3-5 steps with a railing? : Total 6 Click Score: 13    End of Session   Activity Tolerance: Patient tolerated treatment well;Other (comment) (limited by cognition) Patient left: with call bell/phone within reach;in bed;with bed alarm set   PT Visit Diagnosis: Other abnormalities of gait and mobility (R26.89)    Time: 1235-1250 PT Time Calculation (min) (ACUTE ONLY): 15 min   Charges:   PT Evaluation $PT Eval Low Complexity: [place], PT  Acute Rehab Dept ([place] Pager [phone]  12/02/2020   [facility] 12/02/2020, 2:01 PM

## 2020-12-02 NOTE — Consult Note (Incomplete Revision)
Neurology Consultation Reason for Consult: new strokes Requesting Physician: Alexa Mcdonald  CC: dysphagia  History is obtained from:patient chart, nursing staff.   HPI: Alexa Mcdonald is a 87 y.o. female  With history of chronic atrial fibrillation, diabetes mellitus, hyperthyroidism who was brought to the ED from her SNF for difficulty swallowing. Please note that she was brought to the ED earlier the day before for unresponsive episode and was discharged back to facility after observation and family didn't want admission. Patient has been diminishing physically recently, per nursing report.   Patient was admitted for further workup of dysphagia/odynophagia unknown cause. As part of her workup, a brain MRI was done 4/30 showing DWI changes at right parietal cortex, postcentral gyrus, and additional punctate DWI abnormality along right parietal cortex as well: consistent with embolic appearing small acute ischemic infarcts. MRA head done this morning 12/02/20 showed no acute significant cerebral vascular abnormalities.  She is on eliquis 2.5mg  bid for known a fib.  Stroke neurology was consulted regarding new infarcts, and we thank Dr. Lonny Mcdonald for the kind referral.   LKW:  unclear tPA given?: No,  Premorbid modified rankin scale: 4      0 - No symptoms.     1 - No significant disability. Able to carry out all usual activities, despite some symptoms.     2 - Slight disability. Able to look after own affairs without assistance, but unable to carry out all previous activities.     3 - Moderate disability. Requires some help, but able to walk unassisted.     4 - Moderately severe disability. Unable to attend to own bodily needs without assistance, and unable to walk unassisted.     5 - Severe disability. Requires constant nursing care and attention, bedridden, incontinent.     6 - Dead.   ROS:   Unable to obtain due to altered mental status.   Past Medical History:  Diagnosis Date  . 1st degree  AV block   . Bradycardia   . Chronic diastolic heart failure ([place]) 02/07/2016  . CKD (chronic kidney disease), stage III ([place])   . Colon, diverticulosis   . Diabetes mellitus    diet controlled  . Essential hypertension   . GERD (gastroesophageal reflux disease)   . Hematochezia   . HOH (hard of hearing)    wears bilateral hearing aids  . Hx of orthostatic hypotension   . Interstitial lung disease ([place])   . LBBB (left bundle branch block)   . Long term current use of anticoagulant therapy   . Mediastinal goiter     recurrent s/p resection 1988  . Mild CAD   . Mitral regurgitation   . Osteopenia   . Osteoporosis   . Paroxysmal atrial fibrillation (H[facility])    a. CHA2DS2VASc = 5-->coumadin;  b. previously on amio->d/c'd 11/2014 secondary to concern for possible amio lung.  . Pneumonia   . 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 Severe aortic stenosis    s/p TAVR with stable prosthesis by echo 08/2019 - mean AVG 24mmHg  . Stroke ([place])   . Tachycardia-bradycardia ([place])   . Vitamin D deficiency       Family History  Problem Relation Age of Onset  . Diabetes Mother   . Cancer - Prostate Father   . Pulmonary embolism Father   . Hypertension Sister   . Diabetes Sister   .  Hypertension Brother   . Arrhythmia Brother        afib  . CAD Brother   . Pulmonary fibrosis Brother   . Colon cancer Neg Hx   . Esophageal cancer Neg Hx   . Inflammatory bowel disease Neg Hx   . Liver disease Neg Hx   . Pancreatic cancer Neg Hx   . Stomach cancer Neg Hx   . Rectal cancer Neg Hx       Social History:  reports that she has never smoked. She has never used smokeless tobacco. She reports previous alcohol use. She reports that she does not use drugs.    Exam: Current vital signs: BP (!) 147/64 (BP Location: Left Arm)   Pulse (!) 51   Temp 97.9 F (36.6 C) (Oral)    Resp 15   Ht 5\' 1"  (1.549 m)   Wt 49.9 kg   SpO2 96%   BMI 20.79 kg/m  Vital signs in last 24 hours: Temp:  [97.7 F (36.5 C)-98.2 F (36.8 C)] 97.9 F (36.6 C) (05/01 1357) Pulse Rate:  [51-60] 51 (05/01 1357) Resp:  [9-18] 15 (05/01 1357) BP: (128-161)/(43-67) 147/64 (05/01 1357) SpO2:  [93 %-96 %] 96 % (05/01 1357) Weight:  [49.9 kg] 49.9 kg (04/30 1835)   Physical Exam  Constitutional: Appears well-developed and well-nourished.  Psych: Affect appropriate to situation,    Eyes: No scleral injection HENT: No oropharyngeal obstruction.  MSK: no joint deformities.  Cardiovascular: Normal rate and regular rhythm.  Respiratory: Effort normal, non-labored breathing GI: Soft.  No distension. There is no tenderness.  Skin: Warm dry and intact visible skin  Neuro: Mental Status: Patient is awake, alert, oriented to person,   month, year. Patient is not  able to give a clear and coherent history.  No signs of aphasia or neglect   Cranial Nerves: II: Visual Fields are full. Pupils are equal, round, and reactive to light.    III,IV, VI: EOMI without ptosis or diploplia.  V: Facial sensation is symmetric to temperature VII: Facial movement is symmetric.  VIII: hearing is intact to voice X: Uvula elevates symmetrically XI: Shoulder shrug is symmetric. XII: tongue is midline without atrophy or fasciculations.   Motor: Tone is normal. Bulk is normal. 4/5 strength was present in all four extremities.   Sensory: Sensation is symmetric to light touch and temperature in the arms and legs.  Deep Tendon Reflexes: 2+ and symmetric in the biceps and patellae.   Plantars: Toes are downgoing bilaterally.   Cerebellar: Not able to be tested    I have reviewed labs in epic and the results pertinent to this consultation are:  Results for Alexa, Mcdonald (MRN [id number]) as of 12/02/2020 16:50  Ref. Range 12/02/2020 06:03  Sodium Latest Ref Range: 135 - 145 mmol/L 142  Potassium  Latest Ref Range: 3.5 - 5.1 mmol/L 4.5  Chloride Latest Ref Range: 98 - 111 mmol/L 106  CO2 Latest Ref Range: 22 - 32 mmol/L 26  Glucose Latest Ref Range: 70 - 99 mg/dL 85  BUN Latest Ref Range: 8 - 23 mg/dL 29 (H)  Creatinine Latest Ref Range: 0.44 - 1.00 mg/dL 1.82 (H)  Calcium Latest Ref Range: 8.9 - 10.3 mg/dL 8.6 (L)  Anion gap Latest Ref Range: 5 - 15  10  Alkaline Phosphatase Latest Ref Range: 38 - 126 U/L 76  Albumin Latest Ref Range: 3.5 - 5.0 g/dL 3.2 (L)  AST Latest Ref Range: 15 - 41 U/L 15  ALT Latest Ref Range: 0 - 44 U/L 9  Total Protein Latest Ref Range: 6.5 - 8.1 g/dL 6.0 (L)  Total Bilirubin Latest Ref Range: 0.3 - 1.2 mg/dL 1.1  GFR, Estimated Latest Ref Range: >60 mL/min 25 (L)  Total CHOL/HDL Ratio Latest Units: RATIO 3.3  Cholesterol Latest Ref Range: 0 - 200 mg/dL 117  HDL Cholesterol Latest Ref Range: >40 mg/dL 35 (L)  LDL (calc) Latest Ref Range: 0 - 99 mg/dL 70  Triglycerides Latest Ref Range: <150 mg/dL 59  VLDL Latest Ref Range: 0 - 40 mg/dL 12  WBC Latest Ref Range: 4.0 - 10.5 K/uL 8.6  RBC Latest Ref Range: 3.87 - 5.11 MIL/uL 3.89  Hemoglobin Latest Ref Range: 12.0 - 15.0 g/dL 12.3  HCT Latest Ref Range: 36.0 - 46.0 % 38.4  MCV Latest Ref Range: 80.0 - 100.0 fL 98.7  MCH Latest Ref Range: 26.0 - 34.0 pg 31.6  MCHC Latest Ref Range: 30.0 - 36.0 g/dL 32.0  RDW Latest Ref Range: 11.5 - 15.5 % 14.9  Platelets Latest Ref Range: 150 - 400 K/uL 199  nRBC Latest Ref Range: 0.0 - 0.2 % 0.0    I have reviewed the images obtained:  MRA head 12/02/20 Negative for age intracranial MRA.  MRI brain without contrast 12/01/20 7 mm focus of restricted diffusion seen involving the right parietal cortex, postcentral gyrus (series 5, image 92). An additional subtle punctate focus of diffusion abnormality noted slightly superiorly at the right parietal cortex as well (series 5, image 95). Findings consistent with small acute ischemic infarcts, likely embolic.  No associated hemorrhage or mass effect.  Impression:   87 year old white female, a SNF resident, admitted into hospital for evaluation of dysphagia. Found to have embolic appearing infarcts at right hemisphere.  Recommendations: Hemoglobin a1cc, fasting lipid panel] Carotid US PT/OT/ST consult Echocardiogram Continue eliquis 2.5mg  bid (stroke is extremely small, risk of recurrent ischemic stroke felt to outweigh risk of hemorrhagic conversion) Risk factor modification Telemetry monitoring Frequent neuro checks per protocol Permissive hypertension x 48 hours of admission. Use protocol to return to normotension. Unlikely to benefit from statin therapy due to advanced age. We will following along. Thank you for permission to participate in her care.   Note written by Claiborne Billings PA and edited by me to reflect my findings and recommendations.  Su Monks, MD [facility] [phone]  If 7pm- 7am, please page neurology on call as listed in [place].

## 2020-12-02 NOTE — Progress Notes (Signed)
Order for NIH stroke scale placed, ICU charge RN contacted to do initial NIH assessment.

## 2020-12-03 DIAGNOSIS — Z515 Encounter for palliative care: Secondary | ICD-10-CM

## 2020-12-03 DIAGNOSIS — Z7189 Other specified counseling: Secondary | ICD-10-CM

## 2020-12-03 LAB — GLUCOSE, CAPILLARY
Glucose-Capillary: 104 mg/dL — ABNORMAL HIGH (ref 70–99)
Glucose-Capillary: 129 mg/dL — ABNORMAL HIGH (ref 70–99)
Glucose-Capillary: 182 mg/dL — ABNORMAL HIGH (ref 70–99)
Glucose-Capillary: 72 mg/dL (ref 70–99)
Glucose-Capillary: 84 mg/dL (ref 70–99)
Glucose-Capillary: 90 mg/dL (ref 70–99)

## 2020-12-03 MED ORDER — ENSURE ENLIVE PO LIQD
237.0000 mL | Freq: Two times a day (BID) | ORAL | Status: DC
  Administered 2020-12-04 – 2020-12-05 (×3): 237 mL via ORAL

## 2020-12-03 NOTE — Progress Notes (Signed)
Initial Nutrition Assessment  INTERVENTION:   -Ensure Enlive po BID, each supplement provides 350 kcal and 20 grams of protein  NUTRITION DIAGNOSIS:   Inadequate oral intake related to dysphagia as evidenced by per patient/family report.  GOAL:   Patient will meet greater than or equal to 90% of their needs  MONITOR:   PO intake,Supplement acceptance,Labs,Weight trends,I & O's  REASON FOR ASSESSMENT:   Malnutrition Screening Tool    ASSESSMENT:   87 y.o.  female with history of chronic A. fib, diabetes mellitus, hyperthyroidism. Patient presented from her SNF secondary to dysphagia and found to have an acute stroke.  Patient alert/oriented x 1. No family at bedside at time of visit.  Pt not eating much, per documentation pt ate a few bites of ice cream around lunchtime.  SLP evaluated today and recommended diet downgrade to dysphagia 1.  Will also order Ensure supplements for additional kcals and protein. Noted palliative care to see patient.   Per weight records, pt has lost 17 lbs since 06/17/20 (13% wt loss x 6 months, significant for time frame).  Per nursing documentation, pt with moderate to severe BLE edema.  Medications reviewed.  Labs reviewed: CBGs: 72-125  NUTRITION - FOCUSED PHYSICAL EXAM:  Deferred.  Diet Order:   Diet Order            DIET - DYS 1 Room service appropriate? No; Fluid consistency: Thin  Diet effective now                 EDUCATION NEEDS:   No education needs have been identified at this time  Skin:  Skin Assessment: Reviewed RN Assessment  Last BM:  PTA  Height:   Ht Readings from Last 1 Encounters:  12/01/20 5\' 1"  (1.549 m)    Weight:   Wt Readings from Last 1 Encounters:  12/01/20 49.9 kg    BMI:  Body mass index is 20.79 kg/m.  Estimated Nutritional Needs:   Kcal:  1250-1450  Protein:  60-75g  Fluid:  1.5L/day  Clayton Bibles, MS, RD, LDN Inpatient Clinical Dietitian Contact information available  via Amion

## 2020-12-03 NOTE — Progress Notes (Signed)
Arrived on unit to perform shift NIH stroke screen. Informed by bedside RN that it has been completed for this shift. Confirmed with her that patient had no other acute needs.

## 2020-12-03 NOTE — Progress Notes (Signed)
PROGRESS NOTE    Alexa Mcdonald  ZOX:[id number] DOB: 1920/10/13 DOA: 12/01/2020 PCP: Jonathon Jordan, MD   Brief Narrative: Alexa Mcdonald is a 87 y.o.  female with history of chronic A. fib, diabetes mellitus, hyperthyroidism. Patient presented from her SNF secondary to dysphagia and found to have an acute stroke.   Assessment & Plan:   Active Problems:   Essential hypertension   CKD (chronic kidney disease), stage III (HCC)   Hyperthyroidism   Chronic anticoagulation   Interstitial lung disease (HCC)   Atrial fibrillation, persistent (HCC)   S/P TAVR (transcatheter aortic valve replacement)   Diabetes mellitus ([place])   Dysphagia   Acute CVA (cerebrovascular accident) ([place])   Dysphagia Concern this is secondary to CVA. Failed bedside swallow study -SLP recommendations: Diet recommendations: Dysphagia 1 (puree);Thin liquid Liquids provided via: Cup;Straw Medication Administration: Whole meds with puree Supervision: Full supervision/cueing for compensatory strategies;Staff to assist with self feeding;Patient able to self feed Compensations: Slow rate;Small sips/bites Postural Changes and/or Swallow Maneuvers: Seated upright 90 degrees  Acute CVA MRI brain significant for multiple subcentimeter ischemic infarcts of right parietal cortex. Neurology consulted. LDL of 70. TSH of 0.48. MRA without etiology. Neurology recommending no statin therapy, no need for Transthoracic Echocardiogram, continue Eliquis 2.5 mg BID, outpatient neurology follow-up in 3-6 weeks. PT/OT recommending SNF. SLP recommendations as mentioned above.  Chronic atrial fibrillation Patient is on Eliquis as an outpatient. Started on heparin drip secondary to dysphagia and now transitioned back to home Eliquis -Continue Eliquis 2.5 mg BID  Possible aspiration pneumonia In setting of dysphagia. Empirically started on Ceftriaxone and azithromycin on admission. Blood cultures with no growth to  date. -Continue Ceftriaxone/Azithromycin  AKI on CKD stage IIIb Baseline creatinine of 1.4-1.8. Creatinine of 2.22 on admission. Improved today. -Continue IV fluids  Diabetes mellitus, type 2 Last hemoglobin A1C of 6.2 from 06/2020.  Hyperthyroidism Patient is on methimazole as an outpatient. Methimazole held secondary to dysphagia.  Diastolic heart dysfunction History of TAVR History. Patient is on Lasix as an outpatient. Euvolemic. Transthoracic Echocardiogram ordered for above. Pending.  DVT prophylaxis: Heparin IV Code Status:   Code Status: DNR Family Communication: Son on telephone Disposition Plan: Discharge likely back to SNF pending continued workup/management   Consultants:   Neurology  Palliative care medicine  Procedures:   None  Antimicrobials:  Ceftriaxone  Azithromycin   Subjective: No issues. No concerns today although she was asking about her breakfast.  Objective: Vitals:   12/02/20 1357 12/02/20 2212 12/03/20 0437 12/03/20 1155  BP: (!) 147/64 (!) 147/56 (!) 147/57 (!) 150/67  Pulse: (!) 51 (!) 57 (!) 47 (!) 53  Resp: 15 16 17 16   Temp: 97.9 F (36.6 C) 98.6 F (37 C) 98.9 F (37.2 C) 97.8 F (36.6 C)  TempSrc: Oral Oral Oral Oral  SpO2: 96% 95% 96% 96%  Weight:      Height:        Intake/Output Summary (Last 24 hours) at 12/03/2020 1307 Last data filed at 12/03/2020 0331 Gross per 24 hour  Intake 350 ml  Output 1 ml  Net 349 ml   Filed Weights   12/01/20 1835  Weight: 49.9 kg    Examination:  General exam: Appears calm and comfortable Respiratory system: Clear to auscultation. Respiratory effort normal. Cardiovascular system: S1 & S2 heard, RRR. Gastrointestinal system: Abdomen is nondistended, soft and nontender. No organomegaly or masses felt. Normal bowel sounds heard. Central nervous system: Alert and oriented to person and place.  No dysarthria. Musculoskeletal: No edema. No calf tenderness Skin: No cyanosis. No new  rashes    Data Reviewed: I have personally reviewed following labs and imaging studies  CBC Lab Results  Component Value Date   WBC 8.6 12/02/2020   RBC 3.89 12/02/2020   HGB 12.3 12/02/2020   HCT 38.4 12/02/2020   MCV 98.7 12/02/2020   MCH 31.6 12/02/2020   PLT 199 12/02/2020   MCHC 32.0 12/02/2020   RDW 14.9 12/02/2020   LYMPHSABS 1.0 12/01/2020   MONOABS 0.8 12/01/2020   EOSABS 0.1 12/01/2020   BASOSABS 0.1 88/41/6606     Last metabolic panel Lab Results  Component Value Date   NA 142 12/02/2020   K 4.5 12/02/2020   CL 106 12/02/2020   CO2 26 12/02/2020   BUN 29 (H) 12/02/2020   CREATININE 1.82 (H) 12/02/2020   GLUCOSE 85 12/02/2020   GFRNONAA 25 (L) 12/02/2020   GFRAA 31 (L) 08/16/2019   CALCIUM 8.6 (L) 12/02/2020   PROT 6.0 (L) 12/02/2020   ALBUMIN 3.2 (L) 12/02/2020   LABGLOB 2.3 12/31/2018   AGRATIO 1.7 12/31/2018   BILITOT 1.1 12/02/2020   ALKPHOS 76 12/02/2020   AST 15 12/02/2020   ALT 9 12/02/2020   ANIONGAP 10 12/02/2020    CBG (last 3)  Recent Labs    12/03/20 0431 12/03/20 0730 12/03/20 1152  GLUCAP 72 84 90     GFR: Estimated Creatinine Clearance: 13.3 mL/min (A) (by C-G formula based on SCr of 1.82 mg/dL (H)).  Coagulation Profile: Recent Labs  Lab 12/01/20 1851  INR 1.2    Recent Results (from the past 240 hour(s))  Blood Culture (routine x 2)     Status: None (Preliminary result)   Collection Time: 12/01/20  6:51 PM   Specimen: BLOOD  Result Value Ref Range Status   Specimen Description   Final    BLOOD RIGHT ANTECUBITAL Performed at [facility] [address] [place], [place] [address]    Special Requests   Final    BOTTLES DRAWN AEROBIC AND ANAEROBIC Blood Culture adequate volume Performed at [facility] [address], [place], [place] [address]    Culture   Final    NO GROWTH 1 DAY Performed at [facility], [place] [address], [place], [place] [address]    Report  Status PENDING  Incomplete  Blood Culture (routine x 2)     Status: None (Preliminary result)   Collection Time: 12/01/20  6:56 PM   Specimen: BLOOD  Result Value Ref Range Status   Specimen Description   Final    BLOOD BLOOD LEFT HAND Performed at [facility] [address] [place], [place] [address]    Special Requests   Final    BOTTLES DRAWN AEROBIC AND ANAEROBIC Blood Culture adequate volume Performed at [facility] [address], [place], [place] [address]    Culture   Final    NO GROWTH 1 DAY Performed at [facility], [place] [address] [place], [place] [address]    Report Status PENDING  Incomplete  Resp Panel by RT-PCR (Flu A&B, Covid) Nasopharyngeal Swab     Status: None   Collection Time: 12/01/20  8:36 PM   Specimen: Nasopharyngeal Swab; Nasopharyngeal(NP) swabs in vial transport medium  Result Value Ref Range Status   SARS Coronavirus 2 by RT PCR NEGATIVE NEGATIVE Final    Comment: (NOTE) SARS-CoV-2 target nucleic acids are NOT  DETECTED.  The SARS-CoV-2 RNA is generally detectable in upper respiratory specimens during the acute phase of infection. The lowest concentration of SARS-CoV-2 viral copies this assay can detect is 138 copies/mL. A negative result does not preclude SARS-Cov-2 infection and should not be used as the sole basis for treatment or other patient management decisions. A negative result may occur with  improper specimen collection/handling, submission of specimen other than nasopharyngeal swab, presence of viral mutation(s) within the areas targeted by this assay, and inadequate number of viral copies(<138 copies/mL). A negative result must be combined with clinical observations, patient history, and epidemiological information. The expected result is Negative.  Fact Sheet for Patients:  EntrepreneurPulse.com.au  Fact Sheet for Healthcare Providers:   IncredibleEmployment.be  This test is no t yet approved or cleared by the [place] FDA and  has been authorized for detection and/or diagnosis of SARS-CoV-2 by FDA under an Emergency Use Authorization (EUA). This EUA will remain  in effect (meaning this test can be used) for the duration of the COVID-19 declaration under Section 564(b)(1) of the Act, 21 U.S.C.section 360bbb-3(b)(1), unless the authorization is terminated  or revoked sooner.       Influenza A by PCR NEGATIVE NEGATIVE Final   Influenza B by PCR NEGATIVE NEGATIVE Final    Comment: (NOTE) The Xpert Xpress SARS-CoV-2/FLU/RSV plus assay is intended as an aid in the diagnosis of influenza from Nasopharyngeal swab specimens and should not be used as a sole basis for treatment. Nasal washings and aspirates are unacceptable for Xpert Xpress SARS-CoV-2/FLU/RSV testing.  Fact Sheet for Patients: EntrepreneurPulse.com.au  Fact Sheet for Healthcare Providers: IncredibleEmployment.be  This test is not yet approved or cleared by the [place] FDA and has been authorized for detection and/or diagnosis of SARS-CoV-2 by FDA under an Emergency Use Authorization (EUA). This EUA will remain in effect (meaning this test can be used) for the duration of the COVID-19 declaration under Section 564(b)(1) of the Act, 21 U.S.C. section 360bbb-3(b)(1), unless the authorization is terminated or revoked.  Performed at [facility], [place] [address] [place], [place] [address]         Radiology Studies: DG Neck Soft Tissue  Result Date: 12/01/2020 CLINICAL DATA:  Difficulty swallowing, lethargy EXAM: NECK SOFT TISSUES - 1+ VIEW COMPARISON:  None. FINDINGS: Frontal and lateral views of the soft tissues of the neck are obtained. Prevertebral soft tissues appear unremarkable. Epiglottis is normal. The airway is patent. Surgical clips are seen in the right  supraclavicular region. No acute bony abnormalities. Multilevel cervical facet hypertrophy. Mild anterolisthesis of C5 on C6 likely due to degenerative changes. IMPRESSION: 1. Unremarkable soft tissues of the neck. 2. Multilevel cervical degenerative changes. Electronically Signed   By: Randa Ngo M.D.   On: 12/01/2020 20:03   MR ANGIO HEAD WO CONTRAST  Result Date: 12/02/2020 CLINICAL DATA:  87 year old female with neurologic deficit. MRI yesterday reveals several punctate cortical infarcts in the posterior right MCA territory. EXAM: MRA HEAD WITHOUT CONTRAST TECHNIQUE: Angiographic images of the Circle of Willis were obtained using MRA technique without intravenous contrast. COMPARISON:  Brain MRI 12/01/2020. FINDINGS: Antegrade flow in the posterior circulation with dominant distal right vertebral artery. PICA origins appear patent. MOTSA artifact in the V4 segments but no distal vertebral stenosis suspected. Patent vertebrobasilar junction. Tortuous basilar artery without stenosis. Patent SCA and PCA origins. Posterior communicating arteries are diminutive or absent. Bilateral PCA branches are within normal limits. Antegrade flow in both ICA siphons. Tortuous left  ICA below the skull base at the C1 level. No siphon stenosis. Mild siphon irregularity. Patent carotid termini, MCA and ACA origins. Diminutive or absent anterior communicating artery. Visible ACA branches are within normal limits. MCA M1 segments and MCA bifurcations appear patent without stenosis. Visible bilateral MCA branches are within normal limits. IMPRESSION: Negative for age intracranial MRA. Electronically Signed   By: Genevie Ann M.D.   On: 12/02/2020 10:42   MR BRAIN WO CONTRAST  Result Date: 12/01/2020 CLINICAL DATA:  Initial evaluation for neuro deficit, stroke suspected. EXAM: MRI HEAD WITHOUT CONTRAST TECHNIQUE: Multiplanar, multiecho pulse sequences of the brain and surrounding structures were obtained without intravenous  contrast. COMPARISON:  Prior head CT from 11/30/2020 FINDINGS: Brain: Diffuse prominence of the CSF containing spaces compatible with generalized cerebral atrophy, most pronounced at the anterior temporal lobes bilaterally. Patchy and confluent T2/FLAIR hyperintensity within the periventricular and deep white matter both cerebral hemispheres as well as the pons, most consistent with chronic small vessel ischemic disease, mild for age. Area of encephalomalacia and gliosis with associated chronic hemosiderin staining at the left parieto-occipital region consistent with a chronic ischemic infarct. Additional few scattered small remote bilateral cerebellar infarcts noted as well. Associated chronic hemosiderin staining noted at the inferior left cerebellum as well. Small remote lacunar infarcts at the right basal ganglia and left thalamus. 7 mm focus of restricted diffusion seen involving the right parietal cortex, postcentral gyrus (series 5, image 92). An additional subtle punctate focus of diffusion abnormality noted slightly superiorly at the right parietal cortex as well (series 5, image 95). Findings consistent with small acute ischemic infarcts, likely embolic. No associated hemorrhage or mass effect. No other evidence for acute or subacute ischemia. Gray-white matter differentiation otherwise maintained. No other areas of remote cortical infarction. No other evidence for acute or chronic intracranial hemorrhage. No mass lesion, midline shift or mass effect. Mild ventricular prominence related to global parenchymal volume loss without hydrocephalus. No extra-axial fluid collection. Note made of an empty sella.  Midline structures intact. Vascular: Major intracranial vascular flow voids are maintained. Skull and upper cervical spine: Craniocervical junction within normal limits. Bone marrow signal intensity normal. No focal marrow replacing lesion. No scalp soft tissue abnormality. Sinuses/Orbits: Patient status  post bilateral ocular lens replacement. Globes and orbital soft tissues demonstrate no acute finding. Paranasal sinuses are clear. No significant mastoid effusion. Other: None. IMPRESSION: 1. Few subcentimeter ischemic infarcts measuring up to 7 mm involving the high posterior right parietal cortex as above. No associated hemorrhage or mass effect. 2. No other acute intracranial abnormality. 3. Underlying age-related cerebral atrophy with mild chronic small vessel ischemic disease, with multiple remote lacunar infarcts as detailed above. Electronically Signed   By: Jeannine Boga M.D.   On: 12/01/2020 23:07   DG Chest Port 1 View  Result Date: 12/01/2020 CLINICAL DATA:  Sepsis, difficulty swallowing, lethargy EXAM: PORTABLE CHEST 1 VIEW COMPARISON:  11/30/2020 at 8:55 p.m. FINDINGS: Single frontal view of the chest demonstrates a stable cardiac silhouette with aortic valve prosthesis. Patchy consolidation primarily at the lung bases unchanged since previous exam. No large effusion or pneumothorax. No acute bony abnormality. IMPRESSION: 1. Stable patchy bilateral airspace disease which may reflect edema or infection. Electronically Signed   By: Randa Ngo M.D.   On: 12/01/2020 20:01   VAS US CAROTID (at [facility] and [facility] only)  Result Date: 12/02/2020 Carotid Arterial Duplex Study Patient Name:  CYSTAL SHANNAHAN  Date of Exam:   12/02/2020 Medical Rec #:  [id number]         Accession #:    [id number] Date of Birth: February 19, 1921        Patient Gender: F Patient Age:   32Y Exam Location:  [facility] Procedure:      VAS US CAROTID Referring Phys: [id number] [facility] --------------------------------------------------------------------------------  Indications:       CVA. Risk Factors:      Diabetes, coronary artery disease, prior CVA. Comparison Study:  no prior Performing Technologist: Abram Sander RVS  Examination Guidelines: A complete evaluation includes B-mode imaging, spectral Doppler, color  Doppler, and power Doppler as needed of all accessible portions of each vessel. Bilateral testing is considered an integral part of a complete examination. Limited examinations for reoccurring indications may be performed as noted.  Right Carotid Findings: +----------+--------+--------+--------+------------------+--------+           PSV cm/sEDV cm/sStenosisPlaque DescriptionComments +----------+--------+--------+--------+------------------+--------+ CCA Prox  44      5               heterogenous               +----------+--------+--------+--------+------------------+--------+ CCA Distal62      9               heterogenous               +----------+--------+--------+--------+------------------+--------+ ICA Prox  63      12      1-39%   heterogenous               +----------+--------+--------+--------+------------------+--------+ ICA Distal70      11                                         +----------+--------+--------+--------+------------------+--------+ ECA       60                                                 +----------+--------+--------+--------+------------------+--------+ +----------+--------+-------+--------+-------------------+           PSV cm/sEDV cmsDescribeArm Pressure (mmHG) +----------+--------+-------+--------+-------------------+ [id number]                                         +----------+--------+-------+--------+-------------------+ +---------+--------+--+--------+---------+ VertebralPSV cm/s49EDV cm/sAntegrade +---------+--------+--+--------+---------+  Left Carotid Findings: +----------+--------+--------+--------+------------------+--------+           PSV cm/sEDV cm/sStenosisPlaque DescriptionComments +----------+--------+--------+--------+------------------+--------+ CCA Prox  71      12              heterogenous               +----------+--------+--------+--------+------------------+--------+ CCA Distal72       8               heterogenous               +----------+--------+--------+--------+------------------+--------+ ICA Prox  72      10      1-39%   heterogenous               +----------+--------+--------+--------+------------------+--------+ ICA Distal78      9                                          +----------+--------+--------+--------+------------------+--------+  ECA       93                                                 +----------+--------+--------+--------+------------------+--------+ +----------+--------+--------+--------+-------------------+           PSV cm/sEDV cm/sDescribeArm Pressure (mmHG) +----------+--------+--------+--------+-------------------+ [id number]                                          +----------+--------+--------+--------+-------------------+ +---------+--------+--+--------+---------+ VertebralPSV cm/s81EDV cm/sAntegrade +---------+--------+--+--------+---------+   Summary: Right Carotid: Velocities in the right ICA are consistent with a 1-39% stenosis. Left Carotid: Velocities in the left ICA are consistent with a 1-39% stenosis. Vertebrals: Bilateral vertebral arteries demonstrate antegrade flow. *See table(s) above for measurements and observations.     Preliminary         Scheduled Meds: .  stroke: mapping our early stages of recovery book   Does not apply Once  . apixaban  2.5 mg Oral BID  . atorvastatin  10 mg Oral Daily  . insulin aspart  0-6 Units Subcutaneous Q4H  . methimazole  2.5 mg Oral q AM  . pantoprazole  40 mg Oral Daily   Continuous Infusions: . azithromycin 500 mg (12/03/20 0748)  . cefTRIAXone (ROCEPHIN)  IV 1 g (12/03/20 0748)     LOS: 1 day     Cordelia Poche, MD [facility] 12/03/2020, 1:07 PM  If 7PM-7AM, please contact night-coverage [email or web address]

## 2020-12-03 NOTE — Progress Notes (Signed)
  Speech Language Pathology Treatment: Dysphagia  Patient Details Name: Alexa Mcdonald MRN: [id number] DOB: 20-Nov-1920 Today's Date: 12/03/2020 Time: 1120-1140 SLP Time Calculation (min) (ACUTE ONLY): 20 min  Assessment / Plan / Recommendation Clinical Impression  Patient seen by SLP for diet toleration of current diet Dys 2 solids and thin liquids. Patient initially was refusing PO's but when SLP brought breakfast tray in front of her, she did then state that she would like to try it. She exhibited significantly prolonged mastication of Dys 2 texture eggs and SLP observed trace residuals in mouth after over a minute of mastication. Oral residuals cleared with bites of puree solids (applesauce) and in addition, patient demonstrated improved oral phase of swallow with puree solids. One instance of delayed dry cough observed after patient consumed solids and thin liquids, however voice remained clear throughout and no other overt s/s aspiration or penetration were observed. SLP downgrading solid textures to Dys 1 puree.   HPI HPI: Alexa Mcdonald is a 87 y.o. female with advanced dementia presenting from [facility] with dysphagia. MRI showed acute CVA with multiple subcentimeter ischemic infarcts of right high posterior parietal cortex. DG neck soft tissue: Multilevel cervical degenerative changes. Pt had ED visit 4/20 for unwitnessed fall with scalp laceration, CT head negative.  PMH: chronic A. fib, diabetes mellitus, hyperthyroidism.      SLP Plan  Continue with current plan of care       Recommendations  Diet recommendations: Dysphagia 1 (puree);Thin liquid Liquids provided via: Cup;Straw Medication Administration: Whole meds with puree Supervision: Full supervision/cueing for compensatory strategies;Staff to assist with self feeding;Patient able to self feed Compensations: Slow rate;Small sips/bites Postural Changes and/or Swallow Maneuvers: Seated upright 90 degrees                 Oral Care Recommendations: Oral care BID;Other (Comment) (oral care after PO's) Follow up Recommendations: Skilled Nursing facility SLP Visit Diagnosis: Dysphagia, unspecified (R13.10) Plan: Continue with current plan of care       GO               Sonia Baller, MA, CCC-SLP Speech Therapy

## 2020-12-04 LAB — GLUCOSE, CAPILLARY
Glucose-Capillary: 111 mg/dL — ABNORMAL HIGH (ref 70–99)
Glucose-Capillary: 127 mg/dL — ABNORMAL HIGH (ref 70–99)
Glucose-Capillary: 139 mg/dL — ABNORMAL HIGH (ref 70–99)
Glucose-Capillary: 157 mg/dL — ABNORMAL HIGH (ref 70–99)
Glucose-Capillary: 176 mg/dL — ABNORMAL HIGH (ref 70–99)
Glucose-Capillary: 197 mg/dL — ABNORMAL HIGH (ref 70–99)

## 2020-12-04 MED ORDER — AZITHROMYCIN 250 MG PO TABS
500.0000 mg | ORAL_TABLET | Freq: Every day | ORAL | Status: DC
  Administered 2020-12-05: 500 mg via ORAL
  Filled 2020-12-04: qty 2

## 2020-12-04 NOTE — Progress Notes (Signed)
PHARMACIST - PHYSICIAN COMMUNICATION   CONCERNING: Antibiotic IV to Oral Route Change Policy   RECOMMENDATION: This patient is receiving azithromycin  by the intravenous route.  Based on criteria approved by the Pharmacy and Therapeutics Committee, the antibiotic(s) is/are being converted to the equivalent oral dose form(s).     DESCRIPTION:  These criteria include:  Patient being treated for a respiratory tract infection, urinary tract infection, cellulitis or clostridium difficile associated diarrhea if on metronidazole  The patient is not neutropenic and does not exhibit a GI malabsorption state  The patient is eating (either orally or via tube) and/or has been taking other orally administered medications for a least 24 hours  The patient is improving clinically and has a Tmax < 100.5   If you have questions about this conversion, please contact the Pharmacy Department  []   [phone] )  [facility] []   [phone] )  [facility] []   [phone] )  [facility] []   [phone] )  [facility] [x]   [phone] )  [place], PharmD, [place], [place] Infectious Diseases Clinical Pharmacist Phone: [phone] 12/04/2020 9:42 AM

## 2020-12-04 NOTE — Progress Notes (Signed)
Daily Progress Note   Patient Name: Alexa Mcdonald       Date: 12/04/2020 DOB: [birth year]  Age: 87 y.o. MRN#: [id number] Attending Physician: Mariel Aloe, MD Primary Care Physician: Jonathon Jordan, MD Admit Date: 12/01/2020  Reason for Consultation/Follow-up: Establishing goals of care  Subjective: I saw and examined Alexa Mcdonald this morning.  She was awake and alert and lying in bed in no distress.  Discussed later in the day with CM who has been in contact with her son, Alexa Mcdonald.  Goal at this time is to transition to rehab to see how much functional status she can regain.  Alexa Mcdonald had told me yesterday that she has done well with rehabbing in the past.  During her last stay at rehab, she regained ability to mobilize independently with a short course of rehab.  Length of Stay: 2  Current Medications: Scheduled Meds:  .  stroke: mapping our early stages of recovery book   Does not apply Once  . apixaban  2.5 mg Oral BID  . atorvastatin  10 mg Oral Daily  . [START ON 12/05/2020] azithromycin  500 mg Oral Daily  . feeding supplement  237 mL Oral BID BM  . insulin aspart  0-6 Units Subcutaneous Q4H  . methimazole  2.5 mg Oral q AM  . pantoprazole  40 mg Oral Daily    Continuous Infusions: . cefTRIAXone (ROCEPHIN)  IV 1 g (12/04/20 0718)    PRN Meds:   Physical Exam         General: Alert, awake, in no acute distress.  HEENT: No bruits, no goiter, no JVD Heart: Regular rate and rhythm. No murmur appreciated. Lungs: Good air movement, clear Abdomen: Soft, nontender, nondistended, positive bowel sounds.  Ext: No significant edema Skin: Warm and dry  Vital Signs: BP (!) 162/49 (BP Location: Right Arm)   Pulse 95   Temp 97.6 F (36.4 C) (Oral)   Resp 16   Ht 5\' 1"  (1.549  m)   Wt 49.9 kg   SpO2 97%   BMI 20.79 kg/m  SpO2: SpO2: 97 % O2 Device: O2 Device: Room Air O2 Flow Rate:    Intake/output summary:   Intake/Output Summary (Last 24 hours) at 12/04/2020 1851 Last data filed at 12/04/2020 1300 Gross per 24 hour  Intake 240 ml  Output 700 ml  Net -460 ml   LBM: Last BM Date: 12/03/20 Baseline Weight: Weight: 49.9 kg Most recent weight: Weight: 49.9 kg       Palliative Assessment/Data:    Flowsheet Rows   Flowsheet Row Most Recent Value  Intake Tab   Referral Department Hospitalist  Unit at Time of Referral Med/Surg Unit  Palliative Care Primary Diagnosis Neurology  Date Notified 12/02/20  Palliative Care Type Return patient Palliative Care  Reason for referral Clarify Goals of Care  Date of Admission 12/01/20  Date first seen by Palliative Care 12/03/20  # of days Palliative referral response time 1 Day(s)  # of days IP prior to Palliative referral 1  Clinical Assessment   Palliative Performance Scale Score 40%  Psychosocial & Spiritual Assessment   Palliative Care Outcomes   Patient/Family meeting held? Yes  Who was at the meeting? Son via phone      Patient Active Problem List   Diagnosis Date Noted  . Acute CVA (cerebrovascular accident) ([place]) 12/02/2020  . Dysphagia 12/01/2020  . Rhabdomyolysis 06/17/2020  . Lactic acidosis 06/17/2020  . Diabetes mellitus ([place])   . Iron deficiency anemia 12/05/2018  . Duodenal ulcer 12/05/2018  . Gastric erosion 12/05/2018  . History of cholecystectomy 12/05/2018  . History of ERCP 12/05/2018  . Iron deficiency anemia due to chronic blood loss   . Erosive gastritis   . Acute lower GI bleeding 11/09/2018  . Acute blood loss anemia 11/09/2018  . Symptomatic anemia 11/09/2018  . Choledocholithiasis with obstruction 10/23/2018  . Occipital stroke ([place]) 06/11/2016  . Paroxysmal atrial fibrillation ([place]) 06/11/2016  . 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 Loss of  peripheral visual field 03/12/2016  . Ataxia 03/12/2016  . Dizziness and giddiness 03/12/2016  . Cerebrovascular accident (CVA) due to embolism of posterior cerebral artery with infarctions of both occipital lobes ([place]) 03/12/2016  . Acute on chronic diastolic CHF (congestive heart failure) ([place]) 02/07/2016  . 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 Severe aortic stenosis   . Atrial fibrillation, persistent ([place])   . Mitral regurgitation   . Interstitial lung disease ([place]) 10/30/2014  . Hematochezia 12/13/2013  . CKD (chronic kidney disease), stage III ([place]) 12/13/2013  . Hyperthyroidism 12/13/2013  . GERD (gastroesophageal reflux disease) 12/13/2013  . Chronic anticoagulation 12/13/2013  . Elevated brain natriuretic peptide (BNP) level 12/13/2013  . Sinus bradycardia 12/13/2013  . Pneumonia 12/13/2013  . Encounter for therapeutic drug monitoring 08/29/2013  . Essential hypertension 08/05/2013  . Edema of both ankles 08/05/2013  . Bradycardia 11/27/2011    Palliative Care Assessment & Plan   Patient Profile: 87 y.o. female  with past medical history of chronic A. fib, diabetes, hypothyroidism admitted on 12/01/2020 from assisted living with dysphagia and found to have acute stroke.  Of note, she has been seen in the ED on 3 occasions in the last couple weeks.  During last evaluation in ED, family requested she be sent back to facility rather than be admitted to the hospital.  Palliative consulted for goals of care.  Recommendations/Plan:  Plan for trial of rehab to see much functional status she can regain.  Would recommend palliative care to follow-up as an outpatient.  Depending upon how she does with rehab, plan will be to continue with current care versus consideration for election of her hospice benefits at some point in the future depending upon her clinical course and functional status following rehab.   Code  Status:    Code Status Orders  (From  admission, onward)         Start     Ordered   12/01/20 2214  Do not attempt resuscitation (DNR)  Continuous       Question Answer Comment  In the event of cardiac or respiratory ARREST Do not call a "code blue"   In the event of cardiac or respiratory ARREST Do not perform Intubation, CPR, defibrillation or ACLS   In the event of cardiac or respiratory ARREST Use medication by any route, position, wound care, and other measures to relive pain and suffering. May use oxygen, suction and manual treatment of airway obstruction as needed for comfort.      12/01/20 2214        Code Status History    Date Active Date Inactive Code Status Order ID Comments User Context   06/17/2020 1520 06/21/2020 2107 DNR [id number]  Vashti Hey, MD ED   11/09/2018 1546 11/13/2018 1758 Full Code [id number]  Lady Deutscher, MD ED   10/23/2018 0021 10/26/2018 2107 Full Code [id number]  Rise Patience, MD ED   10/31/2015 1159 10/31/2015 1724 Full Code [id number]  [name], MD Inpatient   12/12/2013 1918 12/15/2013 1817 Full Code [id number]  Shanda Howells, MD ED   Advance Care Planning Activity       Prognosis:   Unable to determine  Discharge Planning:  [place] for rehab with Palliative care service follow-up  Care plan was discussed with patient and case management  Thank you for allowing the Palliative Medicine Team to assist in the care of this patient.   Total Time 20 Prolonged Time Billed No      Greater than 50%  of this time was spent counseling and coordinating care related to the above assessment and plan.  Micheline Rough, MD  Please contact Palliative Medicine Team phone at [phone] for questions and concerns.

## 2020-12-04 NOTE — Progress Notes (Signed)
Physical Therapy Treatment Patient Details Name: Alexa Mcdonald MRN: [id number] DOB: 11/10/1920 Today's Date: 12/04/2020    History of Present Illness Alexa Mcdonald is a 87 y.o. female adm for further work-up of dysphagia/odynophagia unknown cause, r/o myasthenia gravis, r/o pna.   PMH: chronic A. fib, diabetes mellitus, hyperthyroidism was brought to the ER after patient had difficulty swallowing, pt's son reports increase in general weakness. MRI negative    PT Comments    Pt AxO x 2 much improved and able to state her needs and follow directions.  Very HOH and required repeat instruction.  Assisted OOB to amb around room a limited distance of 11 feet with walker + 2 side by side assist for safety.  Assisted back to bed and  Positioned to comfort.    Follow Up Recommendations  SNF     Equipment Recommendations  None recommended by PT    Recommendations for Other Services       Precautions / Restrictions Precautions Precautions: Fall Precaution Comments: improved...Marland KitchenMarland KitchenAxO x 2 pleasant very HOH    Mobility  Bed Mobility Overal bed mobility: Needs Assistance Bed Mobility: Supine to Sit;Sit to Supine     Supine to sit: Mod assist;HOB elevated Sit to supine: Mod assist;+2 for safety/equipment   General bed mobility comments: assisted to EOB required assist from bed pad.  Once upright, pt able to static sit EOB witgh Supervision.    Transfers Overall transfer level: Needs assistance Equipment used: Rolling walker (2 wheeled) Transfers: Sit to/from [facility] Sit to Stand: Min assist Stand pivot transfers: Min assist       General transfer comment: from slightly elevated bed height. +2 assist available but ultimately pt needed only min guard assist. multi-modal  cues needed to initate.  Ambulation/Gait Ambulation/Gait assistance: Min assist;+2 safety/equipment Gait Distance (Feet): 11 Feet Assistive device: Rolling walker (2 wheeled) Gait  Pattern/deviations: Step-to pattern Gait velocity: decreased   General Gait Details: assist to balance, multi-modal cues for participation,   Stairs             Wheelchair Mobility    Modified Rankin (Stroke Patients Only)       Balance                                            Cognition                                              Exercises      General Comments        Pertinent Vitals/Pain Pain Assessment: Faces Faces Pain Scale: Hurts a little bit Pain Location: "my thumb" but pt pointing to her L great toe(removed sock and no obvious) Pain Descriptors / Indicators: Grimacing;Guarding Pain Intervention(s): Monitored during session;Repositioned    Home Living                      Prior Function            PT Goals (current goals can now be found in the care plan section) Progress towards PT goals: Progressing toward goals    Frequency    Min 2X/week      PT Plan Current plan remains appropriate    Co-evaluation  AM-PAC PT "6 Clicks" Mobility   Outcome Measure  Help needed turning from your back to your side while in a flat bed without using bedrails?: A Little Help needed moving from lying on your back to sitting on the side of a flat bed without using bedrails?: A Little Help needed moving to and from a bed to a chair (including a wheelchair)?: A Little Help needed standing up from a chair using your arms (e.g., wheelchair or bedside chair)?: A Little Help needed to walk in hospital room?: A Little Help needed climbing 3-5 steps with a railing? : A Lot 6 Click Score: 17    End of Session Equipment Utilized During Treatment: Gait belt Activity Tolerance: Patient tolerated treatment well Patient left: in bed;with call bell/phone within reach;with bed alarm set Nurse Communication: Mobility status PT Visit Diagnosis: Other abnormalities of gait and mobility (R26.89)      Time: 1610-9604 PT Time Calculation (min) (ACUTE ONLY): 17 min  Charges:  $Gait Training: 8-22 mins                     Rica Koyanagi  PTA Acute  Rehabilitation Services Pager      [phone] Office      [phone]

## 2020-12-04 NOTE — TOC Initial Note (Signed)
Transition of Care [facility]) - Initial/Assessment Note    Patient Details  Name: Alexa Mcdonald MRN: [id number] Date of Birth: [birth year]  [facility]) CM/SW Contact:    Peter Keyworth, Marjie Skiff, RN Phone Number: 12/04/2020, 3:00 PM  Clinical Narrative:                 Spoke with pt son Alexa Mcdonald via phone as pt is only oriented to self. Pt is from [facility]. Alexa Mcdonald would like pt to go to SNF at this time with Outpatient Palliative services. [facility] liaison contacted to give referral for Palliative services. Permission was given by Alexa Mcdonald to fax out FL2 to area SNF facilities for available beds. Per Alexa Mcdonald, pt has been covid vaccinated x3. TOC will follow up with SNF bed offers when available.   Expected Discharge Plan: Skilled Nursing Facility Barriers to Discharge: Continued Medical Work up   Expected Discharge Plan and Services Expected Discharge Plan: [place]   Discharge Planning Services: CM Consult   Living arrangements for the past 2 months: [place]                       Prior Living Arrangements/Services Living arrangements for the past 2 months: [place] Lives with:: Facility Resident Patient language and need for interpreter reviewed:: Yes        Need for Family Participation in Patient Care: Yes (Comment) Care giver support system in place?: Yes (comment)   Criminal Activity/Legal Involvement Pertinent to Current Situation/Hospitalization: No - Comment as needed  Activities of Daily Living Home Assistive Devices/Equipment: Gilford Rile (specify type) ADL Screening (condition at time of admission) Patient's cognitive ability adequate to safely complete daily activities?: No Is the patient deaf or have difficulty hearing?: Yes Does the patient have difficulty seeing, even when wearing glasses/contacts?: No Does the patient have difficulty concentrating, remembering, or making decisions?:  Yes Patient able to express need for assistance with ADLs?: No Does the patient have difficulty dressing or bathing?: Yes Independently performs ADLs?: No Walks in Home: Independent with device (comment) Does the patient have difficulty walking or climbing stairs?: Yes Weakness of Legs: Both Weakness of Arms/Hands: Both   Admission diagnosis:  AKI (acute kidney injury) ([place]) [N17.9] Dysphagia [R13.10] Dysphagia, unspecified type [R13.10] Acute CVA (cerebrovascular accident) [facility]) [I63.9] Patient Active Problem List   Diagnosis Date Noted  . Acute CVA (cerebrovascular accident) ([place]) 12/02/2020  . Dysphagia 12/01/2020  . Rhabdomyolysis 06/17/2020  . Lactic acidosis 06/17/2020  . Diabetes mellitus ([place])   . Iron deficiency anemia 12/05/2018  . Duodenal ulcer 12/05/2018  . Gastric erosion 12/05/2018  . History of cholecystectomy 12/05/2018  . History of ERCP 12/05/2018  . Iron deficiency anemia due to chronic blood loss   . Erosive gastritis   . Acute lower GI bleeding 11/09/2018  . Acute blood loss anemia 11/09/2018  . Symptomatic anemia 11/09/2018  . Choledocholithiasis with obstruction 10/23/2018  . Occipital stroke ([place]) 06/11/2016  . Paroxysmal atrial fibrillation ([place]) 06/11/2016  . 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 Loss of peripheral visual field 03/12/2016  . Ataxia 03/12/2016  . Dizziness and giddiness 03/12/2016  . Cerebrovascular accident (CVA) due to embolism of posterior cerebral artery with infarctions of both occipital lobes ([place]) 03/12/2016  . Acute on chronic diastolic CHF (congestive heart failure) ([place]) 02/07/2016  . 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 Severe aortic stenosis   . Atrial fibrillation, persistent (  H[facility])   . Mitral regurgitation   . Interstitial lung disease ([place]) 10/30/2014  . Hematochezia 12/13/2013  . CKD (chronic kidney disease), stage III ([place]) 12/13/2013  . Hyperthyroidism  12/13/2013  . GERD (gastroesophageal reflux disease) 12/13/2013  . Chronic anticoagulation 12/13/2013  . Elevated brain natriuretic peptide (BNP) level 12/13/2013  . Sinus bradycardia 12/13/2013  . Pneumonia 12/13/2013  . Encounter for therapeutic drug monitoring 08/29/2013  . Essential hypertension 08/05/2013  . Edema of both ankles 08/05/2013  . Bradycardia 11/27/2011   PCP:  Jonathon Jordan, MD Pharmacy:   [facility] [address], [place] AT [place] [address] [place] [address] Phone: [phone] Fax: [phone]     Social Determinants of Health (SDOH) Interventions    Readmission Risk Interventions Readmission Risk Prevention Plan 12/04/2020  Transportation Screening Complete  PCP or Specialist Appt within 3-5 Days Complete  HRI or [place] Complete  Social Work Consult for [place] Planning/Counseling Complete  Palliative Care Screening Complete  Medication Review Press photographer) Complete  Some recent data might be hidden

## 2020-12-04 NOTE — Plan of Care (Signed)
  Problem: Clinical Measurements: Goal: Ability to maintain clinical measurements within normal limits will improve Outcome: Progressing Goal: Will remain free from infection Outcome: Progressing   Problem: Activity: Goal: Risk for activity intolerance will decrease Outcome: Progressing   Problem: Nutrition: Goal: Adequate nutrition will be maintained Outcome: Progressing   Problem: Elimination: Goal: Will not experience complications related to bowel motility Outcome: Progressing Goal: Will not experience complications related to urinary retention Outcome: Progressing   Problem: Safety: Goal: Ability to remain free from injury will improve Outcome: Progressing   Problem: Skin Integrity: Goal: Risk for impaired skin integrity will decrease Outcome: Progressing   Problem: Health Behavior/Discharge Planning: Goal: Ability to manage health-related needs will improve Outcome: Progressing   Problem: Self-Care: Goal: Ability to participate in self-care as condition permits will improve Outcome: Progressing   Problem: Nutrition: Goal: Risk of aspiration will decrease Outcome: Progressing Goal: Dietary intake will improve Outcome: Progressing

## 2020-12-04 NOTE — Consult Note (Signed)
Consultation Note Date: 12/04/2020   Patient Name: Alexa Mcdonald  DOB: [birth year]  MRN: [id number]  Age / Sex: 87 y.o., female  PCP: Jonathon Jordan, MD Referring Physician: Mariel Aloe, MD  Reason for Consultation: Establishing goals of care  HPI/Patient Profile: 87 y.o. female  with past medical history of chronic A. fib, diabetes, hypothyroidism admitted on 12/01/2020 from assisted living with dysphagia and found to have acute stroke.  Of note, she has been seen in the ED on 3 occasions in the last couple weeks.  During last evaluation in ED, family requested she be sent back to facility rather than be admitted to the hospital.  Palliative consulted for goals of care.  Clinical Assessment and Goals of Care: I saw and examined Alexa Mcdonald today.  She was awake and alert and lying in bed in no distress.  She does not really answer questions appropriately, but staff reports she does not have her hearing aids and has trouble hearing which may be contributing to potential confusion as well.  I called and was able to reach her son, [name].  We discussed clinical course as well as wishes moving forward in regard to advanced directives.  Concepts specific to code status and rehospitalization discussed.  We discussed difference between a aggressive medical intervention path and a palliative, comfort focused care path.  Values and goals of care important to patient and family were attempted to be elicited.  Questions and concerns addressed.   PMT will continue to support holistically.  SUMMARY OF RECOMMENDATIONS   - DNR/DNI - Continue current interventions.  [name] reports that he has been told his mother is much more awake and interactive today.  He is planning to come and see her later. - Overall, family is working to promote best quality of life as possible for Ms. Alexa Mcdonald.  We discussed today  consideration for electing her hospice benefits at assisted living facility.  Her son will discuss with rest of family and we will evaluate how she is doing tomorrow and continue conversation regarding potential for electing her hospice benefits at time of discharge.  Code Status/Advance Care Planning:  DNR  Prognosis:  Likely < 6 months if goal becomes comfort and avoiding hospital based aggressive care  Discharge Planning: To Be Determined      Primary Diagnoses: Present on Admission: . Essential hypertension . CKD (chronic kidney disease), stage III ([place]) . Hyperthyroidism . Interstitial lung disease ([place]) . Atrial fibrillation, persistent ([place]) . Acute CVA (cerebrovascular accident) ([place])   I have reviewed the medical record, interviewed the patient and family, and examined the patient. The following aspects are pertinent.  Past Medical History:  Diagnosis Date  . 1st degree AV block   . Bradycardia   . Chronic diastolic heart failure ([place]) 02/07/2016  . CKD (chronic kidney disease), stage III ([place])   . Colon, diverticulosis   . Diabetes mellitus    diet controlled  . Essential hypertension   . GERD (gastroesophageal reflux disease)   . Hematochezia   .  HOH (hard of hearing)    wears bilateral hearing aids  . Hx of orthostatic hypotension   . Interstitial lung disease ([place])   . LBBB (left bundle branch block)   . Long term current use of anticoagulant therapy   . Mediastinal goiter     recurrent s/p resection 1988  . Mild CAD   . Mitral regurgitation   . Osteopenia   . Osteoporosis   . Paroxysmal atrial fibrillation ([facility])    a. CHA2DS2VASc = 5-->coumadin;  b. previously on amio->d/c'd 11/2014 secondary to concern for possible amio lung.  . Pneumonia   . 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 Severe aortic stenosis    s/p TAVR with  stable prosthesis by echo 08/2019 - mean AVG 30mmHg  . Stroke ([place])   . Tachycardia-bradycardia ([place])   . Vitamin D deficiency    Social History   Socioeconomic History  . Marital status: Widowed    Spouse name: Not on file  . Number of children: 3  . Years of education: Not on file  . Highest education level: Not on file  Occupational History  . Occupation: retire  Tobacco Use  . Smoking status: Never Smoker  . Smokeless tobacco: Never Used  Vaping Use  . Vaping Use: Never used  Substance and Sexual Activity  . Alcohol use: Not Currently  . Drug use: Never  . Sexual activity: Not Currently  Other Topics Concern  . Not on file  Social History Narrative   ** Merged History Encounter **       Lives in [place].  Widowed.   Social Determinants of Health   Financial Resource Strain: Not on file  Food Insecurity: Not on file  Transportation Needs: Not on file  Physical Activity: Not on file  Stress: Not on file  Social Connections: Not on file   Family History  Problem Relation Age of Onset  . Diabetes Mother   . Cancer - Prostate Father   . Pulmonary embolism Father   . Hypertension Sister   . Diabetes Sister   . Hypertension Brother   . Arrhythmia Brother        afib  . CAD Brother   . Pulmonary fibrosis Brother   . Colon cancer Neg Hx   . Esophageal cancer Neg Hx   . Inflammatory bowel disease Neg Hx   . Liver disease Neg Hx   . Pancreatic cancer Neg Hx   . Stomach cancer Neg Hx   . Rectal cancer Neg Hx    Scheduled Meds: .  stroke: mapping our early stages of recovery book   Does not apply Once  . apixaban  2.5 mg Oral BID  . atorvastatin  10 mg Oral Daily  . feeding supplement  237 mL Oral BID BM  . insulin aspart  0-6 Units Subcutaneous Q4H  . methimazole  2.5 mg Oral q AM  . pantoprazole  40 mg Oral Daily   Continuous Infusions: . azithromycin 500 mg (12/04/20 0718)  . cefTRIAXone (ROCEPHIN)  IV 1 g (12/04/20 0718)   PRN Meds:. Medications  Prior to Admission:  Prior to Admission medications   Medication Sig Start Date End Date Taking? Authorizing Provider  acetaminophen (TYLENOL) 500 MG tablet Take 1-2 tablets (500-1,000 mg total) by mouth every 6 (six) hours as needed for mild pain or moderate pain (or headaches). 11/21/20  Yes Rona Ravens,  Gertie Fey, PA-C  atorvastatin (LIPITOR) 10 MG tablet Take 10 mg by mouth daily.   Yes [provider]  Cyanocobalamin (VITAMIN B-12 PO) Take 1 tablet by mouth daily.   Yes [provider]  ELIQUIS 2.5 MG TABS tablet TAKE 1 TABLET(2.5 MG) BY MOUTH TWICE DAILY 07/23/20  Yes Turner, Traci R, MD  furosemide (LASIX) 20 MG tablet Take 20 mg by mouth every other day.   Yes [provider]  methimazole (TAPAZOLE) 5 MG tablet Take 2.5 mg by mouth in the morning.   Yes [provider]  Multiple Vitamins-Minerals (OCUVITE PRESERVISION PO) Take 1 capsule by mouth in the morning.   Yes [provider]  pantoprazole (PROTONIX) 40 MG tablet TAKE 1 TABLET(40 MG) BY MOUTH DAILY AT 6 AM Patient taking differently: Take 40 mg by mouth daily with breakfast. 03/30/19  Yes Mansouraty, Telford Nab., MD  vitamin B-12 (CYANOCOBALAMIN) 1000 MCG tablet Take 1,000 mcg by mouth daily with breakfast.    Yes [provider]  acetaminophen (TYLENOL) 325 MG tablet Take 2 tablets (650 mg total) by mouth every 6 (six) hours as needed for mild pain. Patient not taking: No sig reported 10/26/18   Swayze, Ava, DO  ferrous sulfate (FEROSUL) 325 (65 FE) MG tablet Take 1 tablet (325 mg total) by mouth daily with breakfast. Patient not taking: Reported on 12/01/2020 09/03/20   Mansouraty, Telford Nab., MD  insulin aspart (NOVOLOG) 100 UNIT/ML injection 0-9 Units, Subcutaneous, 3 times daily with meals CBG < 70: Implement Hypoglycemia measures CBG 70 - 120: 0 units CBG 121 - 150: 1 unit CBG 151 - 200: 2 units CBG 201 - 250: 3 units CBG 251 - 300: 5 units CBG 301 - 350: 7 units CBG 351 - 400: 9  units CBG > 400: call MD Patient not taking: No sig reported 06/21/20   Ghimire, Henreitta Leber, MD  polyethylene glycol (MIRALAX / GLYCOLAX) 17 g packet Take 17 g by mouth daily. Patient not taking: No sig reported 06/21/20   Jonetta Osgood, MD   Allergies  Allergen Reactions  . Amoxicillin Other (See Comments)    tongue felt bruised Did it involve swelling of the face/tongue/throat, SOB, or low BP? No Did it involve sudden or severe rash/hives, skin peeling, or any reaction on the inside of your mouth or nose? No Did you need to seek medical attention at a hospital or doctor's office? No When did it last happen?childhood If all above answers are "NO", may proceed with cephalosporin use.    . Celebrex [Celecoxib] Other (See Comments)    Mouth sores  . Amoxicillin Other (See Comments)    "Made my tongue felt bruised"  . Celecoxib Other (See Comments)    Made the mouth sore   Review of Systems  Unreliable historian  Physical Exam  General: Alert, awake, in no acute distress.  HEENT: No bruits, no goiter, no JVD Heart: Regular rate and rhythm. No murmur appreciated. Lungs: Good air movement, clear Ext: No significant edema Skin: Warm and dry   Vital Signs: BP (!) 153/43 (BP Location: Left Arm)   Pulse (!) 48   Temp 97.9 F (36.6 C) (Oral)   Resp 16   Ht 5\' 1"  (1.549 m)   Wt 49.9 kg   SpO2 100%   BMI 20.79 kg/m  Pain Scale: 0-10   Pain Score: 0-No pain   SpO2: SpO2: 100 % O2 Device:SpO2: 100 % O2 Flow Rate: .   IO: Intake/output summary:  Intake/Output Summary (Last 24 hours) at 12/04/2020 0850 Last data filed at 12/03/2020 2115 Gross per 24 hour  Intake --  Output 1350 ml  Net -1350 ml    LBM: Last BM Date: 12/03/20 Baseline Weight: Weight: 49.9 kg Most recent weight: Weight: 49.9 kg     Palliative Assessment/Data:   Flowsheet Rows   Flowsheet Row Most Recent Value  Intake Tab   Referral Department Hospitalist  Unit at Time of Referral  Med/Surg Unit  Palliative Care Primary Diagnosis Neurology  Date Notified 12/02/20  Palliative Care Type Return patient Palliative Care  Reason for referral Clarify Goals of Care  Date of Admission 12/01/20  Date first seen by Palliative Care 12/03/20  # of days Palliative referral response time 1 Day(s)  # of days IP prior to Palliative referral 1  Clinical Assessment   Palliative Performance Scale Score 40%  Psychosocial & Spiritual Assessment   Palliative Care Outcomes   Patient/Family meeting held? Yes  Who was at the meeting? Son via phone     Time Total: 55 minutes Greater than 50%  of this time was spent counseling and coordinating care related to the above assessment and plan.  Signed by: Micheline Rough, MD   Please contact Palliative Medicine Team phone at [phone] for questions and concerns.  For individual provider: See Shea Evans

## 2020-12-04 NOTE — Progress Notes (Signed)
[facility] ([facility]) Community Based Palliative Care       This patient has been referred to our palliative care services in the community.  [facility] will continue to follow for any discharge planning needs and to coordinate admission onto palliative care.    Thank you for the opportunity to participate in this patient's care.     Chrislyn King, BSN, RN [facility] Hospital Liaison   [phone] (24h on call) 

## 2020-12-04 NOTE — NC FL2 (Signed)
[place] LEVEL OF CARE SCREENING TOOL     IDENTIFICATION  Patient Name: Alexa Mcdonald Birthdate: 1920-11-29 Sex: female Admission Date (Current Location): 12/01/2020  [facility] and [place] Number:  Herbalist and Address:  [facility],  [address], [place]      Provider Number: [id number]  Attending Physician Name and Address:  Mariel Aloe, MD  Relative Name and Phone Number:       Current Level of Care: Hospital Recommended Level of Care: [place] Prior Approval Number:    Date Approved/Denied:   PASRR Number: [id number]  Discharge Plan: SNF    Current Diagnoses: Patient Active Problem List   Diagnosis Date Noted  . Acute CVA (cerebrovascular accident) ([place]) 12/02/2020  . Dysphagia 12/01/2020  . Rhabdomyolysis 06/17/2020  . Lactic acidosis 06/17/2020  . Diabetes mellitus ([place])   . Iron deficiency anemia 12/05/2018  . Duodenal ulcer 12/05/2018  . Gastric erosion 12/05/2018  . History of cholecystectomy 12/05/2018  . History of ERCP 12/05/2018  . Iron deficiency anemia due to chronic blood loss   . Erosive gastritis   . Acute lower GI bleeding 11/09/2018  . Acute blood loss anemia 11/09/2018  . Symptomatic anemia 11/09/2018  . Choledocholithiasis with obstruction 10/23/2018  . Occipital stroke ([place]) 06/11/2016  . Paroxysmal atrial fibrillation ([place]) 06/11/2016  . 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 Loss of peripheral visual field 03/12/2016  . Ataxia 03/12/2016  . Dizziness and giddiness 03/12/2016  . Cerebrovascular accident (CVA) due to embolism of posterior cerebral artery with infarctions of both occipital lobes ([place]) 03/12/2016  . Acute on chronic diastolic CHF (congestive heart failure) ([place]) 02/07/2016  . 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 Severe aortic stenosis   . Atrial fibrillation, persistent ([place])   . Mitral  regurgitation   . Interstitial lung disease ([place]) 10/30/2014  . Hematochezia 12/13/2013  . CKD (chronic kidney disease), stage III ([place]) 12/13/2013  . Hyperthyroidism 12/13/2013  . GERD (gastroesophageal reflux disease) 12/13/2013  . Chronic anticoagulation 12/13/2013  . Elevated brain natriuretic peptide (BNP) level 12/13/2013  . Sinus bradycardia 12/13/2013  . Pneumonia 12/13/2013  . Encounter for therapeutic drug monitoring 08/29/2013  . Essential hypertension 08/05/2013  . Edema of both ankles 08/05/2013  . Bradycardia 11/27/2011    Orientation RESPIRATION BLADDER Height & Weight     Self  Normal Incontinent Weight: 49.9 kg Height:  5\' 1"  (154.9 cm)  BEHAVIORAL SYMPTOMS/MOOD NEUROLOGICAL BOWEL NUTRITION STATUS      Continent Diet (Dysphagia 1)  AMBULATORY STATUS COMMUNICATION OF NEEDS Skin   Limited Assist Verbally Skin abrasions,Bruising                       Personal Care Assistance Level of Assistance  Bathing,Feeding,Dressing Bathing Assistance: Limited assistance Feeding assistance: Limited assistance Dressing Assistance: Limited assistance     Functional Limitations Info  Sight,Hearing,Speech Sight Info: Impaired Hearing Info: Impaired Speech Info: Adequate    SPECIAL CARE FACTORS FREQUENCY  PT (By licensed PT),OT (By licensed OT)     PT Frequency: 5 x weekly OT Frequency: 5 x weekly            Contractures Contractures Info: Not present    Additional Factors Info  Code Status,Allergies Code Status Info: DNR Allergies Info: Amoxicillin, Celebrex           Current Medications (12/04/2020):  This is the current hospital active  medication list Current Facility-Administered Medications  Medication Dose Route Frequency Provider Last Rate Last Admin  .  stroke: mapping our early stages of recovery book   Does not apply Once Rise Patience, MD      . apixaban Arne [place]) tablet 2.5 mg  2.5 mg Oral BID Mariel Aloe, MD   2.5 mg at 12/04/20  0943  . atorvastatin (LIPITOR) tablet 10 mg  10 mg Oral Daily Mariel Aloe, MD   10 mg at 12/04/20 0943  . [START ON 12/05/2020] azithromycin (ZITHROMAX) tablet 500 mg  500 mg Oral Daily Susa Raring, [facility]      . cefTRIAXone (ROCEPHIN) 1 g in sodium chloride 0.9 % 100 mL IVPB  1 g Intravenous Q24H Thomes Lolling, RPH 200 mL/hr at 12/04/20 0718 1 g at 12/04/20 0718  . feeding supplement (ENSURE ENLIVE / ENSURE PLUS) liquid 237 mL  237 mL Oral BID BM Mariel Aloe, MD   237 mL at 12/04/20 1119  . insulin aspart (novoLOG) injection 0-6 Units  0-6 Units Subcutaneous Q4H Rise Patience, MD   1 Units at 12/03/20 2059  . methimazole (TAPAZOLE) tablet 2.5 mg  2.5 mg Oral q AM Mariel Aloe, MD   2.5 mg at 12/04/20 0719  . pantoprazole (PROTONIX) EC tablet 40 mg  40 mg Oral Daily Mariel Aloe, MD   40 mg at 12/04/20 3532     Discharge Medications: Please see discharge summary for a list of discharge medications.  Relevant Imaging Results:  Relevant Lab Results:   Additional Information [id number]   Covid vaccinated x 3  Roemello Speyer, Marjie Skiff, RN

## 2020-12-04 NOTE — Progress Notes (Signed)
PROGRESS NOTE    [name]  LKG:[id number] DOB: 1920/09/25 DOA: 12/01/2020 PCP: Jonathon Jordan, MD   Brief Narrative: DAIJANAE Mcdonald is a 87 y.o.  female with history of chronic A. fib, diabetes mellitus, hyperthyroidism. Patient presented from her SNF secondary to dysphagia and found to have an acute stroke. Plan for discharge to SNF.   Assessment & Plan:   Active Problems:   Essential hypertension   CKD (chronic kidney disease), stage III (HCC)   Hyperthyroidism   Chronic anticoagulation   Interstitial lung disease (HCC)   Atrial fibrillation, persistent (HCC)   S/P TAVR (transcatheter aortic valve replacement)   Diabetes mellitus (Alexa Mcdonald)   Dysphagia   Acute CVA (cerebrovascular accident) ([place])   Dysphagia Concern this is secondary to CVA. Failed bedside swallow study initially. Evaluated by speech therapy. -SLP recommendations: Diet recommendations: Dysphagia 1 (puree);Thin liquid Liquids provided via: Cup;Straw Medication Administration: Whole meds with puree Supervision: Full supervision/cueing for compensatory strategies;Staff to assist with self feeding;Patient able to self feed Compensations: Slow rate;Small sips/bites Postural Changes and/or Swallow Maneuvers: Seated upright 90 degrees  Acute CVA MRI brain significant for multiple subcentimeter ischemic infarcts of right parietal cortex. Neurology consulted. LDL of 70. TSH of 0.48. MRA without etiology. Neurology recommending no statin therapy, no need for Transthoracic Echocardiogram, continue Eliquis 2.5 mg BID, outpatient neurology follow-up in 3-6 weeks. PT/OT recommending SNF. SLP recommendations as mentioned above.  Chronic atrial fibrillation Patient is on Eliquis as an outpatient. Started on heparin drip secondary to dysphagia and now transitioned back to home Eliquis -Continue Eliquis 2.5 mg BID  Possible aspiration pneumonia In setting of dysphagia. Empirically started on Ceftriaxone and  azithromycin on admission. Blood cultures with no growth to date. -Continue Ceftriaxone/Azithromycin x5 days  AKI on CKD stage IIIb Baseline creatinine of 1.4-1.8. Creatinine of 2.22 on admission. Improved to 1.82 with IV fluids. Patient taking by mouth intake now.  Diabetes mellitus, type 2 Last hemoglobin A1C of 6.2 from 06/2020.  Hyperthyroidism Patient is on methimazole as an outpatient. Methimazole held secondary to dysphagia. -Continue methimazole  Diastolic heart dysfunction History of TAVR History. Patient is on Lasix as an outpatient. Euvolemic.  Goals of care Palliative care consulted. Consideration for hospice on discharge. Will likely not be a candidate for hospice facility but rather home with hospice services; current plan is for SNF discharge, though.  DVT prophylaxis: Eliquis Code Status:   Code Status: DNR Family Communication: None at bedside Disposition Plan: Discharge likely back to SNF pending bed availability. Medically stable for discharge.   Consultants:   Neurology  Palliative care medicine  Procedures:   None  Antimicrobials:  Ceftriaxone  Azithromycin   Subjective: No concerns today.  Objective: Vitals:   12/03/20 1155 12/03/20 2055 12/04/20 0451 12/04/20 1319  BP: (!) 150/67 (!) 161/56 (!) 153/43 (!) 162/49  Pulse: (!) 53 (!) 53 (!) 48 95  Resp: 16   16  Temp: 97.8 F (36.6 C) 98.4 F (36.9 C) 97.9 F (36.6 C) 97.6 F (36.4 C)  TempSrc: Oral Oral Oral Oral  SpO2: 96% 100% 100% 97%  Weight:      Height:        Intake/Output Summary (Last 24 hours) at 12/04/2020 1455 Last data filed at 12/04/2020 1300 Gross per 24 hour  Intake 240 ml  Output 1350 ml  Net -1110 ml   Filed Weights   12/01/20 1835  Weight: 49.9 kg    Examination:  General exam: Appears calm and comfortable  Respiratory system: Clear to auscultation. Respiratory effort normal. Cardiovascular system: S1 & S2 heard, RRR. Gastrointestinal system: Abdomen is  nondistended, soft and nontender. No organomegaly or masses felt. Normal bowel sounds heard. Central nervous system: Alert and oriented to person and place. Musculoskeletal: No edema. No calf tenderness Skin: No cyanosis. No new rashes   Data Reviewed: I have personally reviewed following labs and imaging studies  CBC Lab Results  Component Value Date   WBC 8.6 12/02/2020   RBC 3.89 12/02/2020   HGB 12.3 12/02/2020   HCT 38.4 12/02/2020   MCV 98.7 12/02/2020   MCH 31.6 12/02/2020   PLT 199 12/02/2020   MCHC 32.0 12/02/2020   RDW 14.9 12/02/2020   LYMPHSABS 1.0 12/01/2020   MONOABS 0.8 12/01/2020   EOSABS 0.1 12/01/2020   BASOSABS 0.1 05/28/8526     Last metabolic panel Lab Results  Component Value Date   NA 142 12/02/2020   K 4.5 12/02/2020   CL 106 12/02/2020   CO2 26 12/02/2020   BUN 29 (H) 12/02/2020   CREATININE 1.82 (H) 12/02/2020   GLUCOSE 85 12/02/2020   GFRNONAA 25 (L) 12/02/2020   GFRAA 31 (L) 08/16/2019   CALCIUM 8.6 (L) 12/02/2020   PROT 6.0 (L) 12/02/2020   ALBUMIN 3.2 (L) 12/02/2020   LABGLOB 2.3 12/31/2018   AGRATIO 1.7 12/31/2018   BILITOT 1.1 12/02/2020   ALKPHOS 76 12/02/2020   AST 15 12/02/2020   ALT 9 12/02/2020   ANIONGAP 10 12/02/2020    CBG (last 3)  Recent Labs    12/04/20 0446 12/04/20 0730 12/04/20 1224  GLUCAP 127* 111* 139*     GFR: Estimated Creatinine Clearance: 13.3 mL/min (A) (by C-G formula based on SCr of 1.82 mg/dL (H)).  Coagulation Profile: Recent Labs  Lab 12/01/20 1851  INR 1.2    Recent Results (from the past 240 hour(s))  Blood Culture (routine x 2)     Status: None (Preliminary result)   Collection Time: 12/01/20  6:51 PM   Specimen: BLOOD  Result Value Ref Range Status   Specimen Description   Final    BLOOD RIGHT ANTECUBITAL Performed at [facility] [address], [place], [place] [address]    Special Requests   Final    BOTTLES DRAWN AEROBIC AND ANAEROBIC Blood Culture  adequate volume Performed at [facility] [address], [place], [place] [address]    Culture   Final    NO GROWTH 2 DAYS Performed at [facility] [address], [place], [place] [address]    Report Status PENDING  Incomplete  Blood Culture (routine x 2)     Status: None (Preliminary result)   Collection Time: 12/01/20  6:56 PM   Specimen: BLOOD  Result Value Ref Range Status   Specimen Description   Final    BLOOD BLOOD LEFT HAND Performed at [facility] [address] [place], [place] [address]    Special Requests   Final    BOTTLES DRAWN AEROBIC AND ANAEROBIC Blood Culture adequate volume Performed at [facility] [address] [place], [place] [address]    Culture   Final    NO GROWTH 2 DAYS Performed at [facility] [address], [place], [place] [address]    Report Status PENDING  Incomplete  Resp Panel by RT-PCR (Flu A&B, Covid) Nasopharyngeal Swab     Status: None   Collection Time: 12/01/20  8:36 PM  Specimen: Nasopharyngeal Swab; Nasopharyngeal(NP) swabs in vial transport medium  Result Value Ref Range Status   SARS Coronavirus 2 by RT PCR NEGATIVE NEGATIVE Final    Comment: (NOTE) SARS-CoV-2 target nucleic acids are NOT DETECTED.  The SARS-CoV-2 RNA is generally detectable in upper respiratory specimens during the acute phase of infection. The lowest concentration of SARS-CoV-2 viral copies this assay can detect is 138 copies/mL. A negative result does not preclude SARS-Cov-2 infection and should not be used as the sole basis for treatment or other patient management decisions. A negative result may occur with  improper specimen collection/handling, submission of specimen other than nasopharyngeal swab, presence of viral mutation(s) within the areas targeted by this assay, and inadequate number of viral copies(<138 copies/mL). A negative result must be combined with clinical  observations, patient history, and epidemiological information. The expected result is Negative.  Fact Sheet for Patients:  EntrepreneurPulse.com.au  Fact Sheet for Healthcare Providers:  IncredibleEmployment.be  This test is no t yet approved or cleared by the [place] FDA and  has been authorized for detection and/or diagnosis of SARS-CoV-2 by FDA under an Emergency Use Authorization (EUA). This EUA will remain  in effect (meaning this test can be used) for the duration of the COVID-19 declaration under Section 564(b)(1) of the Act, 21 U.S.C.section 360bbb-3(b)(1), unless the authorization is terminated  or revoked sooner.       Influenza A by PCR NEGATIVE NEGATIVE Final   Influenza B by PCR NEGATIVE NEGATIVE Final    Comment: (NOTE) The Xpert Xpress SARS-CoV-2/FLU/RSV plus assay is intended as an aid in the diagnosis of influenza from Nasopharyngeal swab specimens and should not be used as a sole basis for treatment. Nasal washings and aspirates are unacceptable for Xpert Xpress SARS-CoV-2/FLU/RSV testing.  Fact Sheet for Patients: EntrepreneurPulse.com.au  Fact Sheet for Healthcare Providers: IncredibleEmployment.be  This test is not yet approved or cleared by the [place] FDA and has been authorized for detection and/or diagnosis of SARS-CoV-2 by FDA under an Emergency Use Authorization (EUA). This EUA will remain in effect (meaning this test can be used) for the duration of the COVID-19 declaration under Section 564(b)(1) of the Act, 21 U.S.C. section 360bbb-3(b)(1), unless the authorization is terminated or revoked.  Performed at [facility], [place] [address] [place], [place] [address]         Radiology Studies: No results found.      Scheduled Meds: .  stroke: mapping our early stages of recovery book   Does not apply Once  . apixaban  2.5 mg Oral BID   . atorvastatin  10 mg Oral Daily  . [START ON 12/05/2020] azithromycin  500 mg Oral Daily  . feeding supplement  237 mL Oral BID BM  . insulin aspart  0-6 Units Subcutaneous Q4H  . methimazole  2.5 mg Oral q AM  . pantoprazole  40 mg Oral Daily   Continuous Infusions: . cefTRIAXone (ROCEPHIN)  IV 1 g (12/04/20 0718)     LOS: 2 days     Cordelia Poche, MD [facility] 12/04/2020, 2:55 PM  If 7PM-7AM, please contact night-coverage [email or web address]

## 2020-12-04 NOTE — Progress Notes (Signed)
  Speech Language Pathology Treatment:    Patient Details Name: Alexa Mcdonald MRN: [id number] DOB: 08/27/1920 Today's Date: 12/04/2020 Time: 2831-5176 SLP Time Calculation (min) (ACUTE ONLY): 25 min  Assessment / Plan / Recommendation Clinical Impression  Today pt is more alert throughout session, pleasant and able to feed herself.  Observed her with intake of saltine cracker x2 - one with icecream and one with peanut butter, icecream x4 ounces via tsp, water x3 boluses via straw and nutritional supplement x2 boluses via bottle. She is noted to demonstrate vertical mastication pattern but is able to clear masticated food with use of ice cream or liquids independently.  Pt readily stated "That felt like is just got bigger in my mouth" - referring to peanut butter and cracker but good tolerance of icecream on solid.  No oral retention and no indication of airway compromise observed.  Pt may require extra time to eat therefore advise full supervision initially - followed by intermittent supervision when assured pt able to tolerate advanced diet.    Suspect mildly prolonged oral transit/mastication due to her dementia and advanced age with exacerbation due to her AMS as her articulation is clear despite her CVA.  Provided pt written instructions for swallowing and to help with her orientation to medical event.   Note pt's diet has been modified each of the last three days - therefore will follow up x1 to assure po tolerance on consistent basis.  Anticipate with improved mentation and medical status, she will tolerate po well.    HPI HPI: Alexa Mcdonald is a 87 y.o. female with advanced dementia presenting from [facility] with dysphagia. MRI showed acute CVA with multiple subcentimeter ischemic infarcts of right high posterior parietal cortex. DG neck soft tissue: Multilevel cervical degenerative changes. Pt had ED visit 4/20 for unwitnessed fall with scalp laceration, CT head negative.  PMH: chronic A.  fib, diabetes mellitus, hyperthyroidism.    Pt is right handed per her statement and confirmed with observation of self feeding.   She was placed on a dys1/thin diet after clinical swallow evaluation due to her dysphagia. Today pt more alert per RN and follow up needed to assess for readiness for dietary advancement and po tolerance.      SLP Plan  Continue with current plan of care       Recommendations  Diet recommendations: Dysphagia 3 (mechanical soft);Thin liquid Liquids provided via: Cup;Straw Medication Administration: Whole meds with puree Supervision: Full supervision/cueing for compensatory strategies;Staff to assist with self feeding;Patient able to self feed Compensations: Slow rate;Small sips/bites Postural Changes and/or Swallow Maneuvers: Seated upright 90 degrees                Oral Care Recommendations: Oral care BID;Other (Comment) (oral care after PO's) Follow up Recommendations: Skilled Nursing facility SLP Visit Diagnosis: Dysphagia, unspecified (R13.10) Plan: Continue with current plan of care       GO                Macario Golds 12/04/2020, 5:45 PM   Kathleen Lime, MS [facility] SLP [place] Office [phone] Pager [phone]

## 2020-12-05 DIAGNOSIS — I69891 Dysphagia following other cerebrovascular disease: Secondary | ICD-10-CM | POA: Diagnosis not present

## 2020-12-05 DIAGNOSIS — E1122 Type 2 diabetes mellitus with diabetic chronic kidney disease: Secondary | ICD-10-CM | POA: Diagnosis not present

## 2020-12-05 DIAGNOSIS — R41841 Cognitive communication deficit: Secondary | ICD-10-CM | POA: Diagnosis not present

## 2020-12-05 DIAGNOSIS — I4819 Other persistent atrial fibrillation: Secondary | ICD-10-CM | POA: Diagnosis not present

## 2020-12-05 DIAGNOSIS — J690 Pneumonitis due to inhalation of food and vomit: Secondary | ICD-10-CM | POA: Diagnosis not present

## 2020-12-05 DIAGNOSIS — Z7901 Long term (current) use of anticoagulants: Secondary | ICD-10-CM | POA: Diagnosis not present

## 2020-12-05 DIAGNOSIS — R5381 Other malaise: Secondary | ICD-10-CM | POA: Diagnosis not present

## 2020-12-05 DIAGNOSIS — R1310 Dysphagia, unspecified: Secondary | ICD-10-CM | POA: Diagnosis not present

## 2020-12-05 DIAGNOSIS — Z743 Need for continuous supervision: Secondary | ICD-10-CM | POA: Diagnosis not present

## 2020-12-05 DIAGNOSIS — E0590 Thyrotoxicosis, unspecified without thyrotoxic crisis or storm: Secondary | ICD-10-CM | POA: Diagnosis not present

## 2020-12-05 DIAGNOSIS — E785 Hyperlipidemia, unspecified: Secondary | ICD-10-CM | POA: Diagnosis not present

## 2020-12-05 DIAGNOSIS — N179 Acute kidney failure, unspecified: Secondary | ICD-10-CM | POA: Diagnosis not present

## 2020-12-05 DIAGNOSIS — I129 Hypertensive chronic kidney disease with stage 1 through stage 4 chronic kidney disease, or unspecified chronic kidney disease: Secondary | ICD-10-CM | POA: Diagnosis not present

## 2020-12-05 DIAGNOSIS — I959 Hypotension, unspecified: Secondary | ICD-10-CM | POA: Diagnosis not present

## 2020-12-05 DIAGNOSIS — Z7189 Other specified counseling: Secondary | ICD-10-CM | POA: Diagnosis not present

## 2020-12-05 DIAGNOSIS — R531 Weakness: Secondary | ICD-10-CM | POA: Diagnosis not present

## 2020-12-05 DIAGNOSIS — I69828 Other speech and language deficits following other cerebrovascular disease: Secondary | ICD-10-CM | POA: Diagnosis not present

## 2020-12-05 DIAGNOSIS — R1312 Dysphagia, oropharyngeal phase: Secondary | ICD-10-CM | POA: Diagnosis not present

## 2020-12-05 DIAGNOSIS — I69328 Other speech and language deficits following cerebral infarction: Secondary | ICD-10-CM | POA: Diagnosis not present

## 2020-12-05 DIAGNOSIS — I5032 Chronic diastolic (congestive) heart failure: Secondary | ICD-10-CM | POA: Diagnosis not present

## 2020-12-05 DIAGNOSIS — I639 Cerebral infarction, unspecified: Secondary | ICD-10-CM | POA: Diagnosis not present

## 2020-12-05 DIAGNOSIS — M6281 Muscle weakness (generalized): Secondary | ICD-10-CM | POA: Diagnosis not present

## 2020-12-05 DIAGNOSIS — N1832 Chronic kidney disease, stage 3b: Secondary | ICD-10-CM | POA: Diagnosis not present

## 2020-12-05 DIAGNOSIS — I2510 Atherosclerotic heart disease of native coronary artery without angina pectoris: Secondary | ICD-10-CM | POA: Diagnosis not present

## 2020-12-05 DIAGNOSIS — R279 Unspecified lack of coordination: Secondary | ICD-10-CM | POA: Diagnosis not present

## 2020-12-05 DIAGNOSIS — I482 Chronic atrial fibrillation, unspecified: Secondary | ICD-10-CM | POA: Diagnosis not present

## 2020-12-05 DIAGNOSIS — R2689 Other abnormalities of gait and mobility: Secondary | ICD-10-CM | POA: Diagnosis not present

## 2020-12-05 DIAGNOSIS — I69398 Other sequelae of cerebral infarction: Secondary | ICD-10-CM | POA: Diagnosis not present

## 2020-12-05 DIAGNOSIS — Z515 Encounter for palliative care: Secondary | ICD-10-CM | POA: Diagnosis not present

## 2020-12-05 DIAGNOSIS — E039 Hypothyroidism, unspecified: Secondary | ICD-10-CM | POA: Diagnosis not present

## 2020-12-05 DIAGNOSIS — I69391 Dysphagia following cerebral infarction: Secondary | ICD-10-CM | POA: Diagnosis not present

## 2020-12-05 LAB — RESP PANEL BY RT-PCR (FLU A&B, COVID) ARPGX2
Influenza A by PCR: NEGATIVE
Influenza B by PCR: NEGATIVE
SARS Coronavirus 2 by RT PCR: NEGATIVE

## 2020-12-05 LAB — BASIC METABOLIC PANEL
Anion gap: 9 (ref 5–15)
BUN: 25 mg/dL — ABNORMAL HIGH (ref 8–23)
CO2: 23 mmol/L (ref 22–32)
Calcium: 8.9 mg/dL (ref 8.9–10.3)
Chloride: 109 mmol/L (ref 98–111)
Creatinine, Ser: 1.3 mg/dL — ABNORMAL HIGH (ref 0.44–1.00)
GFR, Estimated: 37 mL/min — ABNORMAL LOW (ref >60)
Glucose, Bld: 117 mg/dL — ABNORMAL HIGH (ref 70–99)
Potassium: 3.9 mmol/L (ref 3.5–5.1)
Sodium: 141 mmol/L (ref 135–145)

## 2020-12-05 LAB — ACETYLCHOLINE RECEPTOR AB, ALL
Acety choline binding ab: <0.03 nmol/L (ref 0.00–0.24)
Acetylchol Block Ab: 17 % (ref 0–25)

## 2020-12-05 LAB — GLUCOSE, CAPILLARY
Glucose-Capillary: 109 mg/dL — ABNORMAL HIGH (ref 70–99)
Glucose-Capillary: 110 mg/dL — ABNORMAL HIGH (ref 70–99)
Glucose-Capillary: 126 mg/dL — ABNORMAL HIGH (ref 70–99)
Glucose-Capillary: 146 mg/dL — ABNORMAL HIGH (ref 70–99)
Glucose-Capillary: 167 mg/dL — ABNORMAL HIGH (ref 70–99)
Glucose-Capillary: 181 mg/dL — ABNORMAL HIGH (ref 70–99)

## 2020-12-05 MED ORDER — SODIUM CHLORIDE 0.9 % IV SOLN
1.0000 g | INTRAVENOUS | Status: DC
  Administered 2020-12-05: 1 g via INTRAVENOUS
  Filled 2020-12-05: qty 1

## 2020-12-05 MED ORDER — ENSURE ENLIVE PO LIQD: 237.0000 mL | Freq: Two times a day (BID) | ORAL | 0 refills | Status: AC

## 2020-12-05 MED ORDER — FUROSEMIDE 20 MG PO TABS: 20.0000 mg | ORAL_TABLET | Freq: Every day | ORAL | Status: DC | PRN

## 2020-12-05 NOTE — TOC Transition Note (Signed)
Transition of Care [facility]) - CM/SW Discharge Note   Patient Details  Name: Alexa Mcdonald MRN: [id number] Date of Birth: 05/14/1921  [facility]) CM/SW Contact:  Lynnell Catalan, RN Phone Number: 12/05/2020, 2:01 PM   Clinical Narrative:    SNF bed offers given to son Alexa Mcdonald. [facility] chosen and liaison contacted to accept bed. Pt to dc to [facility] today room 506. RN to call report to [phone]. [facility] to transport pt and yellow DNR on chart for transport.    Final next level of care: Skilled Nursing Facility Barriers to Discharge: No Barriers Identified   Patient Goals and CMS Choice        Discharge Placement              Patient chooses bed at: [facility] Patient to be transferred to facility by: [facility] Name of family member notified: Alexa Mcdonald (son) Patient and family notified of of transfer: 12/05/20  Discharge Plan and Services   Discharge Planning Services: CM Consult                                 Social Determinants of Health (SDOH) Interventions     Readmission Risk Interventions Readmission Risk Prevention Plan 12/04/2020  Transportation Screening Complete  PCP or Specialist Appt within 3-5 Days Complete  HRI or [place] Complete  Social Work Consult for [place] Planning/Counseling Complete  Palliative Care Screening Complete  Medication Review [employer]) Complete  Some recent data might be hidden

## 2020-12-05 NOTE — Consult Note (Signed)
   [facility] Inpatient Consult   12/05/2020  CORAIMA TIBBS 16-Mar-1921 [id number]  Patient screened due to medium risk score for unplanned readmission. Chart reviewed to assess for potential [facility] community service needs. Per review, patient is being recommended for a skilled nursing facility level of care.    No [facility] Care Management follow up needs at this time.  Of note, [facility] Care Management services does not replace or interfere with any services that are arranged by inpatient case management or social work.  Netta Cedars, MSN, [facility] Liaison Nurse Mobile Phone [phone]  Toll free office [phone]

## 2020-12-05 NOTE — Progress Notes (Signed)
Daily Progress Note   Patient Name: Alexa Mcdonald       Date: 12/06/2020 DOB: 12-31-1920  Age: 87 y.o. MRN#: [id number] Attending Physician: No att. providers found Primary Care Physician: Jonathon Jordan, MD Admit Date: 12/01/2020  Reason for Consultation/Follow-up: Establishing goals of care  Subjective: I saw and examined Alexa Mcdonald this morning.  She was sitting in bed no distress.  We discussed plan to transition out of the hospital and she reports looking forward to being outside of the hospital.  Discussed plan to go to rehab and she said she is agreeable at this is recommendation of the doctor.  Length of Stay: 3  Current Medications: Scheduled Meds:    Continuous Infusions:   PRN Meds:   Physical Exam         General: Alert, awake, in no acute distress.  HEENT: No bruits, no goiter, no JVD Heart: Regular rate and rhythm. No murmur appreciated. Lungs: Good air movement, clear Abdomen: Soft, nontender, nondistended, positive bowel sounds.  Ext: No significant edema Skin: Warm and dry  Vital Signs: BP (!) 157/59 (BP Location: Right Arm)   Pulse (!) 53   Temp 98.5 F (36.9 C) (Oral)   Resp 16   Ht 5\' 1"  (1.549 m)   Wt 49.9 kg   SpO2 98%   BMI 20.79 kg/m  SpO2: SpO2: 98 % O2 Device: O2 Device: Room Air O2 Flow Rate:    Intake/output summary:   Intake/Output Summary (Last 24 hours) at 12/06/2020 1343 Last data filed at 12/05/2020 1800 Gross per 24 hour  Intake --  Output 600 ml  Net -600 ml   LBM: Last BM Date: 12/04/20 Baseline Weight: Weight: 49.9 kg Most recent weight: Weight: 49.9 kg       Palliative Assessment/Data:    Flowsheet Rows   Flowsheet Row Most Recent Value  Intake Tab   Referral Department Hospitalist  Unit at Time of  Referral Med/Surg Unit  Palliative Care Primary Diagnosis Neurology  Date Notified 12/02/20  Palliative Care Type Return patient Palliative Care  Reason for referral Clarify Goals of Care  Date of Admission 12/01/20  Date first seen by Palliative Care 12/03/20  # of days Palliative referral response time 1 Day(s)  # of days IP prior to Palliative referral 1  Clinical Assessment  Palliative Performance Scale Score 40%  Psychosocial & Spiritual Assessment   Palliative Care Outcomes   Patient/Family meeting held? Yes  Who was at the meeting? Son via phone      Patient Active Problem List   Diagnosis Date Noted  . Acute CVA (cerebrovascular accident) ([place]) 12/02/2020  . Dysphagia 12/01/2020  . Rhabdomyolysis 06/17/2020  . Lactic acidosis 06/17/2020  . Diabetes mellitus ([place])   . Iron deficiency anemia 12/05/2018  . Duodenal ulcer 12/05/2018  . Gastric erosion 12/05/2018  . History of cholecystectomy 12/05/2018  . History of ERCP 12/05/2018  . Iron deficiency anemia due to chronic blood loss   . Erosive gastritis   . Acute lower GI bleeding 11/09/2018  . Acute blood loss anemia 11/09/2018  . Symptomatic anemia 11/09/2018  . Choledocholithiasis with obstruction 10/23/2018  . Occipital stroke ([place]) 06/11/2016  . Paroxysmal atrial fibrillation ([place]) 06/11/2016  . 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 Loss of peripheral visual field 03/12/2016  . Ataxia 03/12/2016  . Dizziness and giddiness 03/12/2016  . Cerebrovascular accident (CVA) due to embolism of posterior cerebral artery with infarctions of both occipital lobes ([place]) 03/12/2016  . Acute on chronic diastolic CHF (congestive heart failure) ([place]) 02/07/2016  . 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 Severe aortic stenosis   . Atrial fibrillation, persistent ([place])   . Mitral regurgitation   . Interstitial lung disease ([place]) 10/30/2014  . Hematochezia 12/13/2013  . CKD  (chronic kidney disease), stage III ([place]) 12/13/2013  . Hyperthyroidism 12/13/2013  . GERD (gastroesophageal reflux disease) 12/13/2013  . Chronic anticoagulation 12/13/2013  . Elevated brain natriuretic peptide (BNP) level 12/13/2013  . Sinus bradycardia 12/13/2013  . Pneumonia 12/13/2013  . Encounter for therapeutic drug monitoring 08/29/2013  . Essential hypertension 08/05/2013  . Edema of both ankles 08/05/2013  . Bradycardia 11/27/2011    Palliative Care Assessment & Plan   Patient Profile: 87 y.o. female  with past medical history of chronic A. fib, diabetes, hypothyroidism admitted on 12/01/2020 from assisted living with dysphagia and found to have acute stroke.  Of note, she has been seen in the ED on 3 occasions in the last couple weeks.  During last evaluation in ED, family requested she be sent back to facility rather than be admitted to the hospital.  Palliative consulted for goals of care.  Recommendations/Plan:  Continue to recommend outpatient palliative care to follow at rehab to help with further decision making based upon her clinical course over the next few weeks.   Code Status:    Code Status Orders  (From admission, onward)         Start     Ordered   12/01/20 2214  Do not attempt resuscitation (DNR)  Continuous       Question Answer Comment  In the event of cardiac or respiratory ARREST Do not call a "code blue"   In the event of cardiac or respiratory ARREST Do not perform Intubation, CPR, defibrillation or ACLS   In the event of cardiac or respiratory ARREST Use medication by any route, position, wound care, and other measures to relive pain and suffering. May use oxygen, suction and manual treatment of airway obstruction as needed for comfort.      12/01/20 2214        Code Status History    Date Active Date Inactive Code Status Order ID Comments User Context   06/17/2020 1520 06/21/2020 2107 DNR [id number]  Bonnell Public Tublu,  MD ED    11/09/2018 1546 11/13/2018 1758 Full Code [id number]  Lady Deutscher, MD ED   10/23/2018 0021 10/26/2018 2107 Full Code [id number]  Rise Patience, MD ED   10/31/2015 1159 10/31/2015 1724 Full Code [id number]  [name], MD Inpatient   12/12/2013 1918 12/15/2013 1817 Full Code [id number]  Shanda Howells, MD ED   Advance Care Planning Activity       Prognosis:   Unable to determine  Discharge Planning:  [place] [facility] for rehab with Palliative care service follow-up  Care plan was discussed with patient and case management  Thank you for allowing the Palliative Medicine Team to assist in the care of this patient.   Total Time 20 Prolonged Time Billed No   Greater than 50%  of this time was spent counseling and coordinating care related to the above assessment and plan.  Micheline Rough, MD  Please contact Palliative Medicine Team phone at [phone] for questions and concerns.

## 2020-12-05 NOTE — Care Management Important Message (Signed)
Important Message  Patient Details IM Letter placed in Patient's room for daughter Leshea Jaggers Name: Alexa Mcdonald MRN: [id number] Date of Birth: 1920/08/07   Medicare Important Message Given:  Yes     Kerin Salen 12/05/2020, 10:57 AM

## 2020-12-05 NOTE — Progress Notes (Signed)
Occupational Therapy Treatment Patient Details Name: Alexa Mcdonald MRN: [id number] DOB: [birth year] Today's Date: 12/05/2020    History of present illness Alexa Mcdonald is a 87 y.o. female adm for further work-up of dysphagia/odynophagia unknown cause, r/o myasthenia gravis, r/o pna.   PMH: chronic A. fib, diabetes mellitus, hyperthyroidism was brought to the ER after patient had difficulty swallowing, pt's son reports increase in general weakness. MRI on 4/30 revealed: 1. Few subcentimeter ischemic infarcts measuring up to 7 mm  involving the high posterior right parietal cortex as above. No  associated hemorrhage or mass effect.   OT comments  Progress for pt is slow due to memory deficits and poor ability to retain new information and teachings. Example, pt oriented to use of call bell-simple instructions for red button. Pt asked how to use call bell ~30 seconds after education.  Pt showed improved ability to manage bed mobility and stand x 2 from EOB with Min As as well as ambulate in room with RW and Min As to [place] guard assist. Patient limited by impaired cognition, generalized weakness and decreased motivation to participate in ADLs, likely due to lack of situational awareness, along with deficits noted below. Pt continues to demonstrate fair rehab potential and would benefit from continued skilled OT to increase safety and independence with ADLs and functional transfers to allow pt to return home safely and reduce caregiver burden and fall risk.   Follow Up Recommendations  SNF    Equipment Recommendations  None recommended by OT    Recommendations for Other Services      Precautions / Restrictions Precautions Precautions: Fall Precaution Comments: AxO x 1 pleasantly confused very HOH Restrictions Weight Bearing Restrictions: No       Mobility Bed Mobility Overal bed mobility: Needs Assistance Bed Mobility: Supine to Sit     Supine to sit: Min guard;HOB elevated      General bed mobility comments: Pt able to raise trunk and advance LEs to EOB without hands-on assist. Required Min guard assist a trunk to fully scoot forward to EOB to allow feet to touch floor. Patient Response: Cooperative  Transfers Overall transfer level: Needs assistance Equipment used: Rolling walker (2 wheeled) Transfers: Sit to/from [facility] Sit to Stand: Min assist (x 2 reps from EOB. Not following cues for hands placement, so RW secured to floor by OT.) Stand pivot transfers: Min assist       General transfer comment: Pt ambulated ~15' in room with RW from EOB, around bed to recliner. Min As to safely control descent to recliner.    Balance Overall balance assessment: Needs assistance Sitting-balance support: Feet supported;Single extremity supported Sitting balance-Leahy Scale: Good     Standing balance support: Bilateral upper extremity supported Standing balance-Leahy Scale: Poor Standing balance comment: external support for static standing, reliant on UEs                           ADL either performed or assessed with clinical judgement   ADL         Grooming Details (indicate cue type and reason): Pt declined all grooming tasks.                 Toilet Transfer: Minimal assistance;RW;Cueing for Office manager Details (indicate cue type and reason): Simulated to recliner. Please see Mobility section as well. Toileting- Clothing Manipulation and Hygiene:  (Pt able to show BUE ROM sufficient for peri hygiene, however  pt confused and likely requires Min-Mod As for fuill task.)       Functional mobility during ADLs: Min guard;Rolling walker;Cueing for sequencing;Cueing for safety General ADL Comments: Sitting EOB, pt demonstrated BUE ROM symmetrically. Stood x 2 reps from EOB with Min As.     Vision   Vision Assessment?: No apparent visual deficits   Perception     Praxis      Cognition Arousal/Alertness:  Awake/alert Behavior During Therapy: WFL for tasks assessed/performed (Pleasantly confused.) Overall Cognitive Status: History of cognitive impairments - at baseline                                 General Comments: Oriented to person only.  Pt was unable to identify "hospital" despite cues/assistance.  Pt stated that she is leaving to go spend time with friends "past [place]"        Exercises     Shoulder Instructions       General Comments      Pertinent Vitals/ Pain       Pain Assessment: No/denies pain  Home Living                                          Prior Functioning/Environment              Frequency  Min 2X/week        Progress Toward Goals  OT Goals(current goals can now be found in the care plan section)  Progress towards OT goals: Progressing toward goals  Acute Rehab OT Goals Patient Stated Goal: Not to do any work. OT Goal Formulation: Patient unable to participate in goal setting Time For Goal Achievement: 12/16/20 Potential to Achieve Goals: [place] Discharge plan remains appropriate    Co-evaluation                 AM-PAC OT "6 Clicks" Daily Activity     Outcome Measure   Help from another person eating meals?: A Little Help from another person taking care of personal grooming?: A Lot Help from another person toileting, which includes using toliet, bedpan, or urinal?: A Lot Help from another person bathing (including washing, rinsing, drying)?: A Lot Help from another person to put on and taking off regular upper body clothing?: A Little Help from another person to put on and taking off regular lower body clothing?: A Lot 6 Click Score: 14    End of Session Equipment Utilized During Treatment: Rolling walker;Gait belt  OT Visit Diagnosis: Unsteadiness on feet (R26.81);Muscle weakness (generalized) (M62.81);Other symptoms and signs involving cognitive function;Cognitive communication  deficit (R41.841) Symptoms and signs involving cognitive functions: Cerebral infarction   Activity Tolerance Patient limited by fatigue;Patient tolerated treatment well   Patient Left in chair;with call bell/phone within reach;with chair alarm set   Nurse Communication Mobility status (up in chair with chair alarm set)        Time: 1610-9604 OT Time Calculation (min): 28 min  Charges: OT General Charges $OT Visit: 1 Visit OT Treatments $Self Care/Home Management : 8-22 mins $Therapeutic Activity: 8-22 mins  Anderson Malta, OT Acute Rehab Services Office: [phone] 12/05/2020   Olamae Ferrara 12/05/2020, 2:11 PM

## 2020-12-05 NOTE — Plan of Care (Signed)
  Problem: Clinical Measurements: Goal: Ability to maintain clinical measurements within normal limits will improve Outcome: Progressing Goal: Will remain free from infection Outcome: Progressing   Problem: Activity: Goal: Risk for activity intolerance will decrease Outcome: Progressing   Problem: Nutrition: Goal: Adequate nutrition will be maintained Outcome: Progressing   Problem: Elimination: Goal: Will not experience complications related to bowel motility Outcome: Progressing Goal: Will not experience complications related to urinary retention Outcome: Progressing   Problem: Safety: Goal: Ability to remain free from injury will improve Outcome: Progressing   Problem: Skin Integrity: Goal: Risk for impaired skin integrity will decrease Outcome: Progressing   Problem: Health Behavior/Discharge Planning: Goal: Ability to manage health-related needs will improve Outcome: Progressing   Problem: Self-Care: Goal: Ability to participate in self-care as condition permits will improve Outcome: Progressing   Problem: Nutrition: Goal: Risk of aspiration will decrease Outcome: Progressing Goal: Dietary intake will improve Outcome: Progressing

## 2020-12-05 NOTE — Progress Notes (Signed)
Report Given to [place] with [place].

## 2020-12-05 NOTE — Discharge Summary (Addendum)
Physician Discharge Summary  Alexa Mcdonald BTD:[id number] DOB: 15-Sep-1920 DOA: 12/01/2020  PCP: Jonathon Jordan, MD  Admit date: 12/01/2020 Discharge date: 12/05/2020  Admitted From: Home  Disposition:  SNF   Recommendations for Outpatient Follow-up and new medication changes:  1. Follow up with Dr. Stephanie Acre in 7 to 10 days.  2. Continue anticoagulation with apixaban.   I spoke with patient's son at the bedside, we talked in detail about patient's condition, plan of care and prognosis and all questions were addressed.   Home Health: na   Equipment/Devices: na    Discharge Condition: stable  CODE STATUS: DNR   Diet recommendation:  Dysphagia 3 diet.     Diet recommendations: Dysphagia 3 (mechanical soft);Thin liquid Liquids provided via: Cup;Straw Medication Administration: Whole meds with puree Supervision: Full supervision/cueing for compensatory strategies;Staff to assist with self feeding;Patient able to self feed Compensations: Slow rate;Small sips/bites Postural Changes and/or Swallow Maneuvers: Seated upright 90 degrees               Oral Care Recommendations: Oral care BID;Other (Comment) (oral care after PO's) Follow up Recommendations: Skilled Nursing facility SLP Visit Diagnosis: Dysphagia, unspecified (R13.10) Plan: Continue with current plan of care      Brief/Interim Summary: Alexa Mcdonald was admitted to the hospital with the working diagnosis of acute ischemic CVA multiple sub centimeter infarcts of the right parietal cortex, dysphagia and aspiration pneumonitis.   87 year old female past medical history for chronic atrial fibrillation, type 2 diabetes mellitus and hypothyroidism who presented with difficulty swallowing.  Patient was initially brought to the ED for altered mentation the day prior to hospitalization, after an evaluation family decided to take it back home.  At home patient continued to show generalized weakness and dysphagia,  symptoms that prompted her to be brought again back to the hospital.  On her initial physical examination blood pressure 128/62, heart rate 56, respiratory rate 16, oxygen saturation 94%.  Her lungs are clear to auscultation bilaterally, heart S1-2 present, rhythmic, soft abdomen, no lower extremity edema.  She had lower extremity weakness, 3-5 bilaterally.  Positive confusion but no agitation.  Sodium 138, potassium 4.9, chloride 99, bicarb 29, glucose 123, BUN 29, creatinine 2.2, white count 10.7, hemoglobin 14.2, hematocrit 44.7, platelets 229. Urinalysis specific gravity 1.012, 0-5 red cells, 6-10 white cells.  Chest radiograph with right lower lobe atelectasis.  Head CT no acute changes. EKG 63 bpm, left axis deviation, left bundle branch block, sinus rhythm with poor R wave progression, positive LVH, no significant ST segment or T wave changes.  Brain MRI with few subcentimeter ischemic infarcts measuring up to 7 mm involving the high posterior right parietal cortex.  Patient was continue anticoagulation with apixaban.  For possible aspiration pneumonia she received antibiotic therapy for 5 days.  1. Acute ischemic CVA, sub- centimeter multiple at the right parietal cortex.  Dysphagia.  Patient was admitted to the medical ward, she was placed on the remote telemetry monitor.  She had frequent neurochecks and aspiration precautions. LDL 70. HDL 35. Hgb A1c 6,2 (pre-diabetes)  Consultation with neurology recommended conservative work-up, considering patient's advanced age and poor prognosis. At discharge patient is allowed to eat with a dysphagia 3 diet. To continue physical therapy, Occupational Therapy and speech therapy. Continue with statin therapy.   2.  Chronic atrial fibrillation/atrial flutter.  Patient remained rate controlled, continue anticoagulation with apixaban.  3.  Aspiration pneumonitis.  Right lower lobe faint opacity, patient received antibiotic therapy during her  hospitalization.  She has remained afebrile, no leukocytosis.  No dyspnea.  4.  Acute kidney injury on chronic kidney disease stage IIIb.  Patient received supportive medical therapy including intravenous fluids, at her discharge creatinine 1.30, sodium 141, potassium 3.1, chloride 109, bicarb 23, BUN 25.  5.  Hypothyroidism.  Continue methimazole.  6.  Diastolic heart failure.  History of TAVR.  Mitral valve regurgitation per echo in 2021 with preserved LV systolic function, moderate left atrial dilatation. Sever mitral annular calcification.  Patient remains euvolemic, no signs of exacerbation, continue furosemide only as needed.  7. HTN/ dyslipidemia. Blood pressure remained well controlled, continue with statin therapy for dyslipidemia.   Discharge Diagnoses:  Principal Problem:   Acute CVA (cerebrovascular accident) [facility]) Active Problems:   Essential hypertension   CKD (chronic kidney disease), stage III (HCC)   Hyperthyroidism   Chronic anticoagulation   Interstitial lung disease ([place])   Atrial fibrillation, persistent (HCC)   S/P TAVR (transcatheter aortic valve replacement)   Diabetes mellitus ([place])   Dysphagia    Discharge Instructions   Allergies as of 12/05/2020      Reactions   Amoxicillin Other (See Comments)   tongue felt bruised Did it involve swelling of the face/tongue/throat, SOB, or low BP? No Did it involve sudden or severe rash/hives, skin peeling, or any reaction on the inside of your mouth or nose? No Did you need to seek medical attention at a hospital or doctor's office? No When did it last happen?childhood If all above answers are "NO", may proceed with cephalosporin use.   Celebrex [celecoxib] Other (See Comments)   Mouth sores   Amoxicillin Other (See Comments)   "Made my tongue felt bruised"   Celecoxib Other (See Comments)   Made the mouth sore      Medication List    STOP taking these medications   ferrous sulfate 325 (65 FE) MG  tablet Commonly known as: FeroSul   insulin aspart 100 UNIT/ML injection Commonly known as: novoLOG   polyethylene glycol 17 g packet Commonly known as: MIRALAX / GLYCOLAX     TAKE these medications   acetaminophen 500 MG tablet Commonly known as: TYLENOL Take 1-2 tablets (500-1,000 mg total) by mouth every 6 (six) hours as needed for mild pain or moderate pain (or headaches). What changed: Another medication with the same name was removed. Continue taking this medication, and follow the directions you see here.   atorvastatin 10 MG tablet Commonly known as: LIPITOR Take 10 mg by mouth daily.   Eliquis 2.5 MG Tabs tablet Generic drug: apixaban TAKE 1 TABLET(2.5 MG) BY MOUTH TWICE DAILY   feeding supplement Liqd Take 237 mLs by mouth 2 (two) times daily between meals.   furosemide 20 MG tablet Commonly known as: LASIX Take 1 tablet (20 mg total) by mouth daily as needed for fluid or edema (as needed for shortness of breath, lower extremity swelling, or weight gain 3 lbs in 48 hrs or 5 lbs in 7 days.). What changed:   when to take this  reasons to take this   methimazole 5 MG tablet Commonly known as: TAPAZOLE Take 2.5 mg by mouth in the morning.   OCUVITE PRESERVISION PO Take 1 capsule by mouth in the morning.   pantoprazole 40 MG tablet Commonly known as: PROTONIX TAKE 1 TABLET(40 MG) BY MOUTH DAILY AT 6 AM What changed: See the new instructions.   vitamin B-12 1000 MCG tablet Commonly known as: CYANOCOBALAMIN Take 1,000 mcg by mouth daily with  breakfast.   VITAMIN B-12 PO Take 1 tablet by mouth daily.       Follow-up Information    Jonathon Jordan, MD. Schedule an appointment as soon as possible for a visit in 1 week(s).   Specialty: Family Medicine Why: Hospital follow-up Contact information: [place] [address] [phone]        Rosalin Hawking, MD. Schedule an appointment as soon as possible for a visit in 3  week(s).   Specialty: Neurology Why: Stroke follow-up Contact information: [address] [place] [address] [phone]              Allergies  Allergen Reactions  . Amoxicillin Other (See Comments)    tongue felt bruised Did it involve swelling of the face/tongue/throat, SOB, or low BP? No Did it involve sudden or severe rash/hives, skin peeling, or any reaction on the inside of your mouth or nose? No Did you need to seek medical attention at a hospital or doctor's office? No When did it last happen?childhood If all above answers are "NO", may proceed with cephalosporin use.    . Celebrex [Celecoxib] Other (See Comments)    Mouth sores  . Amoxicillin Other (See Comments)    "Made my tongue felt bruised"  . Celecoxib Other (See Comments)    Made the mouth sore    Consultations:  Palliative Care  Neurology (phone)    Procedures/Studies: DG Neck Soft Tissue  Result Date: 12/01/2020 CLINICAL DATA:  Difficulty swallowing, lethargy EXAM: NECK SOFT TISSUES - 1+ VIEW COMPARISON:  None. FINDINGS: Frontal and lateral views of the soft tissues of the neck are obtained. Prevertebral soft tissues appear unremarkable. Epiglottis is normal. The airway is patent. Surgical clips are seen in the right supraclavicular region. No acute bony abnormalities. Multilevel cervical facet hypertrophy. Mild anterolisthesis of C5 on C6 likely due to degenerative changes. IMPRESSION: 1. Unremarkable soft tissues of the neck. 2. Multilevel cervical degenerative changes. Electronically Signed   By: Randa Ngo M.D.   On: 12/01/2020 20:03   CT Head Wo Contrast  Result Date: 11/30/2020 CLINICAL DATA:  Unknown cause, mental status change. EXAM: CT HEAD WITHOUT CONTRAST TECHNIQUE: Contiguous axial images were obtained from the base of the skull through the vertex without intravenous contrast. COMPARISON:  CT head 06/18/2019 FINDINGS: Brain: Cerebral ventricle sizes are concordant  with the degree of cerebral volume loss. Patchy and confluent areas of decreased attenuation are noted throughout the deep and periventricular white matter of the cerebral hemispheres bilaterally, compatible with chronic microvascular ischemic disease. Chronic left occipital infarction. No evidence of large-territorial acute infarction. No parenchymal hemorrhage. No mass lesion. No extra-axial collection. No mass effect or midline shift. No hydrocephalus. Basilar cisterns are patent. Vascular: No hyperdense vessel. Atherosclerotic calcifications are present within the cavernous internal carotid arteries. Skull: No acute fracture or focal lesion. Sinuses/Orbits: Paranasal sinuses and mastoid air cells are clear. Bilateral lens replacement. Otherwise orbits are unremarkable. Other: None. IMPRESSION: No acute intracranial abnormality. Electronically Signed   By: Iven Finn M.D.   On: 11/30/2020 22:44   CT Head Wo Contrast  Result Date: 11/21/2020 CLINICAL DATA:  Fall with laceration EXAM: CT HEAD WITHOUT CONTRAST TECHNIQUE: Contiguous axial images were obtained from the base of the skull through the vertex without intravenous contrast. COMPARISON:  CT brain 06/17/2020 FINDINGS: Brain: No acute territorial infarction, hemorrhage, or intracranial mass. Atrophy and chronic small vessel ischemic changes of the white matter. Small chronic left occipital  infarct. Chronic lacunar infarct in the right cerebellum. Stable ventricle size. Vascular: No hyperdense vessels.  Carotid vascular calcification Skull: Normal. Negative for fracture or focal lesion. Sinuses/Orbits: No acute finding. Other: Small left posterior parietal scalp hematoma IMPRESSION: 1. No CT evidence for acute intracranial abnormality. 2. Atrophy and chronic small vessel ischemic changes of the white matter. Small chronic left occipital and right cerebellar infarcts. Electronically Signed   By: Donavan Foil M.D.   On: 11/21/2020 18:41   CT Cervical  Spine Wo Contrast  Result Date: 11/21/2020 CLINICAL DATA:  Fall with head trauma EXAM: CT CERVICAL SPINE WITHOUT CONTRAST TECHNIQUE: Multidetector CT imaging of the cervical spine was performed without intravenous contrast. Multiplanar CT image reconstructions were also generated. COMPARISON:  06/17/2020, CT 04/25/2009 FINDINGS: Alignment: Normal. Skull base and vertebrae: No acute fracture. No primary bone lesion or focal pathologic process. Soft tissues and spinal canal: No prevertebral fluid or swelling. No visible canal hematoma. Disc levels: Multilevel degenerative changes, with marked disc space narrowing at C6-C7. Facet degenerative changes at multiple levels. Upper chest: Incompletely visualize enlarge heterogeneous thyroid, present on prior CT imaging. In the setting of significant comorbidities or limited life expectancy, no follow-up recommended (ref: J Am Coll Radiol. 2015 Feb;12(2): 143-50). Other: None IMPRESSION: Multilevel degenerative changes of the cervical spine without acute osseous abnormality. Electronically Signed   By: Donavan Foil M.D.   On: 11/21/2020 18:45   MR ANGIO HEAD WO CONTRAST  Result Date: 12/02/2020 CLINICAL DATA:  87 year old female with neurologic deficit. MRI yesterday reveals several punctate cortical infarcts in the posterior right MCA territory. EXAM: MRA HEAD WITHOUT CONTRAST TECHNIQUE: Angiographic images of the Circle of Willis were obtained using MRA technique without intravenous contrast. COMPARISON:  Brain MRI 12/01/2020. FINDINGS: Antegrade flow in the posterior circulation with dominant distal right vertebral artery. PICA origins appear patent. MOTSA artifact in the V4 segments but no distal vertebral stenosis suspected. Patent vertebrobasilar junction. Tortuous basilar artery without stenosis. Patent SCA and PCA origins. Posterior communicating arteries are diminutive or absent. Bilateral PCA branches are within normal limits. Antegrade flow in both ICA  siphons. Tortuous left ICA below the skull base at the C1 level. No siphon stenosis. Mild siphon irregularity. Patent carotid termini, MCA and ACA origins. Diminutive or absent anterior communicating artery. Visible ACA branches are within normal limits. MCA M1 segments and MCA bifurcations appear patent without stenosis. Visible bilateral MCA branches are within normal limits. IMPRESSION: Negative for age intracranial MRA. Electronically Signed   By: Genevie Ann M.D.   On: 12/02/2020 10:42   MR BRAIN WO CONTRAST  Result Date: 12/01/2020 CLINICAL DATA:  Initial evaluation for neuro deficit, stroke suspected. EXAM: MRI HEAD WITHOUT CONTRAST TECHNIQUE: Multiplanar, multiecho pulse sequences of the brain and surrounding structures were obtained without intravenous contrast. COMPARISON:  Prior head CT from 11/30/2020 FINDINGS: Brain: Diffuse prominence of the CSF containing spaces compatible with generalized cerebral atrophy, most pronounced at the anterior temporal lobes bilaterally. Patchy and confluent T2/FLAIR hyperintensity within the periventricular and deep white matter both cerebral hemispheres as well as the pons, most consistent with chronic small vessel ischemic disease, mild for age. Area of encephalomalacia and gliosis with associated chronic hemosiderin staining at the left parieto-occipital region consistent with a chronic ischemic infarct. Additional few scattered small remote bilateral cerebellar infarcts noted as well. Associated chronic hemosiderin staining noted at the inferior left cerebellum as well. Small remote lacunar infarcts at the right basal ganglia and left thalamus. 7 mm focus of restricted  diffusion seen involving the right parietal cortex, postcentral gyrus (series 5, image 92). An additional subtle punctate focus of diffusion abnormality noted slightly superiorly at the right parietal cortex as well (series 5, image 95). Findings consistent with small acute ischemic infarcts, likely  embolic. No associated hemorrhage or mass effect. No other evidence for acute or subacute ischemia. Gray-white matter differentiation otherwise maintained. No other areas of remote cortical infarction. No other evidence for acute or chronic intracranial hemorrhage. No mass lesion, midline shift or mass effect. Mild ventricular prominence related to global parenchymal volume loss without hydrocephalus. No extra-axial fluid collection. Note made of an empty sella.  Midline structures intact. Vascular: Major intracranial vascular flow voids are maintained. Skull and upper cervical spine: Craniocervical junction within normal limits. Bone marrow signal intensity normal. No focal marrow replacing lesion. No scalp soft tissue abnormality. Sinuses/Orbits: Patient status post bilateral ocular lens replacement. Globes and orbital soft tissues demonstrate no acute finding. Paranasal sinuses are clear. No significant mastoid effusion. Other: None. IMPRESSION: 1. Few subcentimeter ischemic infarcts measuring up to 7 mm involving the high posterior right parietal cortex as above. No associated hemorrhage or mass effect. 2. No other acute intracranial abnormality. 3. Underlying age-related cerebral atrophy with mild chronic small vessel ischemic disease, with multiple remote lacunar infarcts as detailed above. Electronically Signed   By: Jeannine Boga M.D.   On: 12/01/2020 23:07   DG Chest Port 1 View  Result Date: 12/01/2020 CLINICAL DATA:  Sepsis, difficulty swallowing, lethargy EXAM: PORTABLE CHEST 1 VIEW COMPARISON:  11/30/2020 at 8:55 p.m. FINDINGS: Single frontal view of the chest demonstrates a stable cardiac silhouette with aortic valve prosthesis. Patchy consolidation primarily at the lung bases unchanged since previous exam. No large effusion or pneumothorax. No acute bony abnormality. IMPRESSION: 1. Stable patchy bilateral airspace disease which may reflect edema or infection. Electronically Signed   By:  Randa Ngo M.D.   On: 12/01/2020 20:01   DG Chest Port 1 View  Result Date: 11/30/2020 CLINICAL DATA:  Syncope, unresponsive. EXAM: PORTABLE CHEST 1 VIEW COMPARISON:  Chest x-rays dated 06/17/2020 and 11/09/2018. FINDINGS: Stable cardiomegaly. Patchy bilateral airspace opacities. No pleural effusion or pneumothorax is seen. Osseous structures about the chest are unremarkable. Lucency under the LEFT hemidiaphragm, presumably within stomach/bowel but free intraperitoneal air cannot be confidently excluded. IMPRESSION: 1. Patchy bilateral airspace opacities. Differential includes multifocal pneumonia, aspiration and pulmonary edema. 2. Stable cardiomegaly. 3. Lucency under the LEFT hemidiaphragm, presumably within stomach/bowel but free intraperitoneal air cannot be confidently excluded. Recommend further characterization with plain film of the abdomen and/or CT. Electronically Signed   By: Franki Cabot M.D.   On: 11/30/2020 21:24   VAS US CAROTID (at [facility] and [facility] only)  Result Date: 12/05/2020 Carotid Arterial Duplex Study Patient Name:  LISVET RASHEED  Date of Exam:   12/02/2020 Medical Rec #: [id number]         Accession #:    [id number] Date of Birth: 08-16-1920        Patient Gender: F Patient Age:   89Y Exam Location:  [facility] Procedure:      VAS US CAROTID Referring Phys: [id number] [address]ore[address] Lew [facility] --------------------------------------------------------------------------------  Indications:       CVA. Risk Factors:      Diabetes, coronary artery disease, prior CVA. Comparison Study:  no prior Performing Technologist: Abram Sander RVS  Examination Guidelines: A complete evaluation includes B-mode imaging, spectral Doppler, color Doppler, and power Doppler as needed of all accessible  portions of each vessel. Bilateral testing is considered an integral part of a complete examination. Limited examinations for reoccurring indications may be performed as noted.  Right Carotid Findings:  +----------+--------+--------+--------+------------------+--------+           PSV cm/sEDV cm/sStenosisPlaque DescriptionComments +----------+--------+--------+--------+------------------+--------+ CCA Prox  44      5               heterogenous               +----------+--------+--------+--------+------------------+--------+ CCA Distal62      9               heterogenous               +----------+--------+--------+--------+------------------+--------+ ICA Prox  63      12      1-39%   heterogenous               +----------+--------+--------+--------+------------------+--------+ ICA Distal70      11                                         +----------+--------+--------+--------+------------------+--------+ ECA       60                                                 +----------+--------+--------+--------+------------------+--------+ +----------+--------+-------+--------+-------------------+           PSV cm/sEDV cmsDescribeArm Pressure (mmHG) +----------+--------+-------+--------+-------------------+ [id number]                                         +----------+--------+-------+--------+-------------------+ +---------+--------+--+--------+---------+ VertebralPSV cm/s49EDV cm/sAntegrade +---------+--------+--+--------+---------+  Left Carotid Findings: +----------+--------+--------+--------+------------------+--------+           PSV cm/sEDV cm/sStenosisPlaque DescriptionComments +----------+--------+--------+--------+------------------+--------+ CCA Prox  71      12              heterogenous               +----------+--------+--------+--------+------------------+--------+ CCA Distal72      8               heterogenous               +----------+--------+--------+--------+------------------+--------+ ICA Prox  72      10      1-39%   heterogenous               +----------+--------+--------+--------+------------------+--------+ ICA  Distal78      9                                          +----------+--------+--------+--------+------------------+--------+ ECA       93                                                 +----------+--------+--------+--------+------------------+--------+ +----------+--------+--------+--------+-------------------+           PSV cm/sEDV cm/sDescribeArm Pressure (mmHG) +----------+--------+--------+--------+-------------------+ [id number]                                          +----------+--------+--------+--------+-------------------+ +---------+--------+--+--------+---------+  VertebralPSV cm/s81EDV cm/sAntegrade +---------+--------+--+--------+---------+   Summary: Right Carotid: Velocities in the right ICA are consistent with a 1-39% stenosis. Left Carotid: Velocities in the left ICA are consistent with a 1-39% stenosis. Vertebrals: Bilateral vertebral arteries demonstrate antegrade flow. *See table(s) above for measurements and observations.  Electronically signed by Antony Contras MD on 12/05/2020 at 8:30:31 AM.    Final        Subjective: Patient is feeling better, tolerating po intake with aspiration precautions, no nausea or vomiting, no dyspnea or cough.   Discharge Exam: Vitals:   12/04/20 2122 12/05/20 0405  BP: (!) 160/62 (!) 154/51  Pulse: (!) 59 (!) 48  Resp: 16 18  Temp: 98.7 F (37.1 C) 98.2 F (36.8 C)  SpO2: 97% 100%   Vitals:   12/04/20 0451 12/04/20 1319 12/04/20 2122 12/05/20 0405  BP: (!) 153/43 (!) 162/49 (!) 160/62 (!) 154/51  Pulse: (!) 48 95 (!) 59 (!) 48  Resp:  16 16 18   Temp: 97.9 F (36.6 C) 97.6 F (36.4 C) 98.7 F (37.1 C) 98.2 F (36.8 C)  TempSrc: Oral Oral Oral Oral  SpO2: 100% 97% 97% 100%  Weight:      Height:        General: Not in pain or dyspnea, deconditioned  Neurology: Awake and alert, non focal  E ENT: no pallor, no icterus, oral mucosa moist Cardiovascular: No JVD. S1-S2 present, rhythmic, positive murmur  at the apex 3/6 radiated to the axilla. No lower extremity edema. Pulmonary: positive breath sounds bilaterally, with no wheezing, rhonchi or rales. Gastrointestinal. Abdomen soft and non tender Skin. No rashes Musculoskeletal: no joint deformities   The results of significant diagnostics from this hospitalization (including imaging, microbiology, ancillary and laboratory) are listed below for reference.     Microbiology: Recent Results (from the past 240 hour(s))  Blood Culture (routine x 2)     Status: None (Preliminary result)   Collection Time: 12/01/20  6:51 PM   Specimen: BLOOD  Result Value Ref Range Status   Specimen Description   Final    BLOOD RIGHT ANTECUBITAL Performed at [facility] [address], [place], [place] [address]    Special Requests   Final    BOTTLES DRAWN AEROBIC AND ANAEROBIC Blood Culture adequate volume Performed at [facility] [address], [place], [place] [address]    Culture   Final    NO GROWTH 3 DAYS Performed at [facility] Lab, [place] [address] [place], [place] [address]    Report Status PENDING  Incomplete  Blood Culture (routine x 2)     Status: None (Preliminary result)   Collection Time: 12/01/20  6:56 PM   Specimen: BLOOD  Result Value Ref Range Status   Specimen Description   Final    BLOOD BLOOD LEFT HAND Performed at [facility] [address], [place], [place] [address]    Special Requests   Final    BOTTLES DRAWN AEROBIC AND ANAEROBIC Blood Culture adequate volume Performed at [facility] [address], [place], [place] [address]    Culture   Final    NO GROWTH 3 DAYS Performed at [facility], [place] [address], [place], [place] [address]    Report Status PENDING  Incomplete  Resp Panel by RT-PCR (Flu A&B, Covid) Nasopharyngeal Swab     Status: None   Collection Time: 12/01/20  8:36 PM   Specimen: Nasopharyngeal Swab;  Nasopharyngeal(NP) swabs  in vial transport medium  Result Value Ref Range Status   SARS Coronavirus 2 by RT PCR NEGATIVE NEGATIVE Final    Comment: (NOTE) SARS-CoV-2 target nucleic acids are NOT DETECTED.  The SARS-CoV-2 RNA is generally detectable in upper respiratory specimens during the acute phase of infection. The lowest concentration of SARS-CoV-2 viral copies this assay can detect is 138 copies/mL. A negative result does not preclude SARS-Cov-2 infection and should not be used as the sole basis for treatment or other patient management decisions. A negative result may occur with  improper specimen collection/handling, submission of specimen other than nasopharyngeal swab, presence of viral mutation(s) within the areas targeted by this assay, and inadequate number of viral copies(<138 copies/mL). A negative result must be combined with clinical observations, patient history, and epidemiological information. The expected result is Negative.  Fact Sheet for Patients:  EntrepreneurPulse.com.au  Fact Sheet for Healthcare Providers:  IncredibleEmployment.be  This test is no t yet approved or cleared by the [place] FDA and  has been authorized for detection and/or diagnosis of SARS-CoV-2 by FDA under an Emergency Use Authorization (EUA). This EUA will remain  in effect (meaning this test can be used) for the duration of the COVID-19 declaration under Section 564(b)(1) of the Act, 21 U.S.C.section 360bbb-3(b)(1), unless the authorization is terminated  or revoked sooner.       Influenza A by PCR NEGATIVE NEGATIVE Final   Influenza B by PCR NEGATIVE NEGATIVE Final    Comment: (NOTE) The Xpert Xpress SARS-CoV-2/FLU/RSV plus assay is intended as an aid in the diagnosis of influenza from Nasopharyngeal swab specimens and should not be used as a sole basis for treatment. Nasal washings and aspirates are unacceptable for Xpert Xpress  SARS-CoV-2/FLU/RSV testing.  Fact Sheet for Patients: EntrepreneurPulse.com.au  Fact Sheet for Healthcare Providers: IncredibleEmployment.be  This test is not yet approved or cleared by the [place] FDA and has been authorized for detection and/or diagnosis of SARS-CoV-2 by FDA under an Emergency Use Authorization (EUA). This EUA will remain in effect (meaning this test can be used) for the duration of the COVID-19 declaration under Section 564(b)(1) of the Act, 21 U.S.C. section 360bbb-3(b)(1), unless the authorization is terminated or revoked.  Performed at [facility], [place] [address] [place], [place] [address]      Labs: BNP (last 3 results) Recent Labs    11/30/20 2032  BNP 970.2*   Basic Metabolic Panel: Recent Labs  Lab 11/30/20 2032 12/01/20 1851 12/02/20 0603 12/05/20 0517  NA 135 138 142 141  K 4.7 4.9 4.5 3.9  CL 99 99 106 109  CO2 27 29 26 23   GLUCOSE 204* 123* 85 117*  BUN 21 29* 29* 25*  CREATININE 1.84* 2.22* 1.82* 1.30*  CALCIUM 8.8* 9.2 8.6* 8.9   Liver Function Tests: Recent Labs  Lab 12/01/20 1851 12/02/20 0603  AST 17 15  ALT 10 9  ALKPHOS 98 76  BILITOT 1.3* 1.1  PROT 7.6 6.0*  ALBUMIN 4.3 3.2*   No results for input(s): LIPASE, AMYLASE in the last 168 hours. No results for input(s): AMMONIA in the last 168 hours. CBC: Recent Labs  Lab 11/30/20 2032 12/01/20 1851 12/02/20 0603  WBC 12.6* 10.7* 8.6  NEUTROABS 11.5* 8.7*  --   HGB 13.1 14.2 12.3  HCT 40.3 44.7 38.4  MCV 96.2 98.0 98.7  PLT 211 229 199   Cardiac Enzymes: No results for input(s): CKTOTAL, CKMB, CKMBINDEX, TROPONINI in the last 168 hours. BNP:  Invalid input(s): POCBNP CBG: Recent Labs  Lab 12/04/20 2124 12/05/20 0006 12/05/20 0403 12/05/20 0736 12/05/20 1150  GLUCAP 176* 146* 109* 110* 167*   D-Dimer No results for input(s): DDIMER in the last 72 hours. Hgb A1c No results for input(s):  HGBA1C in the last 72 hours. Lipid Profile No results for input(s): CHOL, HDL, LDLCALC, TRIG, CHOLHDL, LDLDIRECT in the last 72 hours. Thyroid function studies No results for input(s): TSH, T4TOTAL, T3FREE, THYROIDAB in the last 72 hours.  Invalid input(s): FREET3 Anemia work up No results for input(s): VITAMINB12, FOLATE, FERRITIN, TIBC, IRON, RETICCTPCT in the last 72 hours. Urinalysis    Component Value Date/Time   COLORURINE YELLOW 11/30/2020 2248   APPEARANCEUR CLEAR 11/30/2020 2248   LABSPEC 1.012 11/30/2020 2248   PHURINE 5.0 11/30/2020 2248   GLUCOSEU >=500 (A) 11/30/2020 2248   HGBUR NEGATIVE 11/30/2020 2248   BILIRUBINUR NEGATIVE 11/30/2020 2248   KETONESUR NEGATIVE 11/30/2020 2248   PROTEINUR NEGATIVE 11/30/2020 2248   NITRITE NEGATIVE 11/30/2020 2248   LEUKOCYTESUR TRACE (A) 11/30/2020 2248   Sepsis Labs Invalid input(s): PROCALCITONIN,  WBC,  LACTICIDVEN Microbiology Recent Results (from the past 240 hour(s))  Blood Culture (routine x 2)     Status: None (Preliminary result)   Collection Time: 12/01/20  6:51 PM   Specimen: BLOOD  Result Value Ref Range Status   Specimen Description   Final    BLOOD RIGHT ANTECUBITAL Performed at [facility], [place] [address] [place], [place] [address]    Special Requests   Final    BOTTLES DRAWN AEROBIC AND ANAEROBIC Blood Culture adequate volume Performed at [facility] [address] [place], [place] [address]    Culture   Final    NO GROWTH 3 DAYS Performed at [facility], [place] [address], [place], [place] [address]    Report Status PENDING  Incomplete  Blood Culture (routine x 2)     Status: None (Preliminary result)   Collection Time: 12/01/20  6:56 PM   Specimen: BLOOD  Result Value Ref Range Status   Specimen Description   Final    BLOOD BLOOD LEFT HAND Performed at [facility] [address], [place], [place] [address]    Special Requests    Final    BOTTLES DRAWN AEROBIC AND ANAEROBIC Blood Culture adequate volume Performed at [facility] [address], [place], [place] [address]    Culture   Final    NO GROWTH 3 DAYS Performed at [facility], [place] [address], [place],  [address]    Report Status PENDING  Incomplete  Resp Panel by RT-PCR (Flu A&B, Covid) Nasopharyngeal Swab     Status: None   Collection Time: 12/01/20  8:36 PM   Specimen: Nasopharyngeal Swab; Nasopharyngeal(NP) swabs in vial transport medium  Result Value Ref Range Status   SARS Coronavirus 2 by RT PCR NEGATIVE NEGATIVE Final    Comment: (NOTE) SARS-CoV-2 target nucleic acids are NOT DETECTED.  The SARS-CoV-2 RNA is generally detectable in upper respiratory specimens during the acute phase of infection. The lowest concentration of SARS-CoV-2 viral copies this assay can detect is 138 copies/mL. A negative result does not preclude SARS-Cov-2 infection and should not be used as the sole basis for treatment or other patient management decisions. A negative result may occur with  improper specimen collection/handling, submission of specimen other than nasopharyngeal swab, presence of viral mutation(s) within the areas targeted by this  assay, and inadequate number of viral copies(<138 copies/mL). A negative result must be combined with clinical observations, patient history, and epidemiological information. The expected result is Negative.  Fact Sheet for Patients:  EntrepreneurPulse.com.au  Fact Sheet for Healthcare Providers:  IncredibleEmployment.be  This test is no t yet approved or cleared by the [place] FDA and  has been authorized for detection and/or diagnosis of SARS-CoV-2 by FDA under an Emergency Use Authorization (EUA). This EUA will remain  in effect (meaning this test can be used) for the duration of the COVID-19 declaration under Section 564(b)(1) of the Act,  21 U.S.C.section 360bbb-3(b)(1), unless the authorization is terminated  or revoked sooner.       Influenza A by PCR NEGATIVE NEGATIVE Final   Influenza B by PCR NEGATIVE NEGATIVE Final    Comment: (NOTE) The Xpert Xpress SARS-CoV-2/FLU/RSV plus assay is intended as an aid in the diagnosis of influenza from Nasopharyngeal swab specimens and should not be used as a sole basis for treatment. Nasal washings and aspirates are unacceptable for Xpert Xpress SARS-CoV-2/FLU/RSV testing.  Fact Sheet for Patients: EntrepreneurPulse.com.au  Fact Sheet for Healthcare Providers: IncredibleEmployment.be  This test is not yet approved or cleared by the [place] FDA and has been authorized for detection and/or diagnosis of SARS-CoV-2 by FDA under an Emergency Use Authorization (EUA). This EUA will remain in effect (meaning this test can be used) for the duration of the COVID-19 declaration under Section 564(b)(1) of the Act, 21 U.S.C. section 360bbb-3(b)(1), unless the authorization is terminated or revoked.  Performed at [facility], [place] [address], [place], [place] [address]      Time coordinating discharge: 45 minutes  SIGNED:   Tawni Millers, MD  [facility] 12/05/2020, 12:44 PM

## 2020-12-06 DIAGNOSIS — R1312 Dysphagia, oropharyngeal phase: Secondary | ICD-10-CM | POA: Diagnosis not present

## 2020-12-06 DIAGNOSIS — J690 Pneumonitis due to inhalation of food and vomit: Secondary | ICD-10-CM | POA: Diagnosis not present

## 2020-12-06 DIAGNOSIS — R5381 Other malaise: Secondary | ICD-10-CM | POA: Diagnosis not present

## 2020-12-06 DIAGNOSIS — M6281 Muscle weakness (generalized): Secondary | ICD-10-CM | POA: Diagnosis not present

## 2020-12-07 LAB — CULTURE, BLOOD (ROUTINE X 2)
Culture: NO GROWTH
Culture: NO GROWTH
Special Requests: ADEQUATE
Special Requests: ADEQUATE

## 2020-12-11 ENCOUNTER — Other Ambulatory Visit: Payer: Self-pay

## 2020-12-11 ENCOUNTER — Non-Acute Institutional Stay: Payer: Self-pay | Admitting: Student

## 2020-12-11 DIAGNOSIS — Z515 Encounter for palliative care: Secondary | ICD-10-CM | POA: Diagnosis not present

## 2020-12-11 DIAGNOSIS — R1310 Dysphagia, unspecified: Secondary | ICD-10-CM

## 2020-12-11 DIAGNOSIS — R531 Weakness: Secondary | ICD-10-CM | POA: Diagnosis not present

## 2020-12-11 DIAGNOSIS — I639 Cerebral infarction, unspecified: Secondary | ICD-10-CM

## 2020-12-11 DIAGNOSIS — I5032 Chronic diastolic (congestive) heart failure: Secondary | ICD-10-CM | POA: Diagnosis not present

## 2020-12-11 NOTE — Progress Notes (Signed)
[facility] Consult Note Telephone: [phone]  Fax: [phone]    Date of encounter: 12/11/20 PATIENT NAME: Alexa Mcdonald [address] [place] [address]   [phone] (home) [phone] (work) DOB: 27-Aug-1920 MRN: [id number] PRIMARY CARE PROVIDER:    Jonathon Jordan, MD,  [place] [address] [place] [address] [phone]  REFERRING PROVIDER:   Dr. Nyra Capes  RESPONSIBLE PARTY:    Contact Information    Name Relation Home Work Mobile   Hollett,[name] Son   [phone]   [facility] Daughter   [phone]   [facility] Granddaughter [phone]     [facility] Other   [phone]   Porsha, Skilton   [phone]   JANILAH, HOJNACKI Granddaughter   [phone]       I met face to face with patient in the facility. Discussed with son, [name] via telephone. Palliative Care was asked to follow this patient by consultation request of Dr. Nyra Capes to address advance care planning and complex medical decision making. This is the initial visit.                                     ASSESSMENT AND PLAN / RECOMMENDATIONS:   Advance Care Planning/Goals of Care: Goals include to maximize quality of life and symptom management. Continue OT/PT/ST as directed. Our advance care planning conversation included a discussion about:     The value and importance of advance care planning   Experiences with loved ones who have been seriously ill or have died   Exploration of personal, cultural or spiritual beliefs that might influence medical decisions   Exploration of goals of care in the event of a sudden injury or illness   Decision not to resuscitate or to de-escalate disease focused treatments due to poor prognosis.  CODE STATUS: DNR  Patient currently receiving therapy services. Son states family would like for patient to be able to return to [facility]. She would need additional resources in  place. Education provided on Palliative Medicine vs. Hospice services. Palliative Medicine will continue to provide support.  Symptom Management/Plan:  Ischemic CVA-patient with generalized weakness and dysphagia secondary to CVA. She is receiving PT/OT/ST services; continue as directed. Staff to assist with adl's. Monitor for falls/safety. Aspiration precautions. Continue Mechanical soft diet/ ground meats. Continue statin and apixaiban as directed.   Diastolic heart failure-patient with LE edema; continue furosemide 70m PRN for edema or shortness of breath, weight gain 3 pounds on 48 hours or 5 pounds in a week. Continue weights on Monday and Thursday. Elevate legs while in bed or recliner.    Follow up Palliative Care Visit: Palliative care will continue to follow for complex medical decision making, advance care planning, and clarification of goals. Return in 4 weeks or prn.   This visit was coded based on medical decision making (MDM).  PPS: 40%  HOSPICE ELIGIBILITY/DIAGNOSIS: TBD  Chief Complaint: Palliative Medicine initial visit, CVA.  HISTORY OF PRESENT ILLNESS:  Alexa BRAWLEYis a 87y.o. year old female  with acute ischemic CVA, dysphagia. Dx also include chronic diastolic heart failure, hx of TAVR,  atrial fibrillation/ flutter-on anticoagulant, hyperthyroidism, hypertension, hyperlipidemia. Patient recently hospitalized 4/30/12/05/2020 with acute ischemic CVA, aspiration, pneumonitis, AKI, CKD 3B.   [facility]. She is participating per staff. Patient with varied appetite; she is receiving mechanical soft diet with  thin liquids, Ensure enlive daily and Ensure supplement 253m bid in between meals. Patient requires redirection per staff. She will ambulate about nursing unit. [facility] Son reports patient had multiple falls recently. She had a sister in law that also resided there and checked on  patient every couple of hours. Recent lab work at facility pending.   History obtained from review of EMR, discussion with primary team, and interview with family, facility staff/caregiver and/or Ms. Beel.  I reviewed available labs, medications, imaging, studies and related documents from the EMR.  Records reviewed and summarized above.   ROS  General: NAD EYES: denies vision changes ENMT: dysphagia Cardiovascular: denies chest pain Pulmonary: denies cough, denies SOB  Abdomen: endorses fair appetite, denies constipation GU: denies dysuria MSK: weakness Skin: denies rashes or wounds Neurological: denies pain, denies insomnia Psych: Endorses positive mood Heme/lymph/immuno: denies bruises, abnormal bleeding  Physical Exam: Weight 124 pounds 12/10/20, 119.2 pounds 12/06/20 Pulse 54, resp 16, b/p 102/58, sats 99% on room air Constitutional: NAD General: frail appearing, thin  EYES: anicteric sclera, lids intact, no discharge  ENMT: slight hard of hearing, oral mucous membranes moist CV: S1S2, bradycardia, regular rhythm, 2+ non pitting LE edema Pulmonary: LCTA, no increased work of breathing, no cough Abdomen: normo-active BS + 4 quadrants, soft and non tender GU: deferred MSK: moves all extremities, ambulatory  Skin: warm and dry, no rashes or wounds on visible skin Neuro: generalized weakness Psych: non-anxious affect, A & O to person, familiars Hem/lymph/immuno: no widespread bruising   CURRENT PROBLEM LIST:  Patient Active Problem List   Diagnosis Date Noted  . Acute CVA (cerebrovascular accident) ([place] 12/02/2020  . Dysphagia 12/01/2020  . Rhabdomyolysis 06/17/2020  . Lactic acidosis 06/17/2020  . Diabetes mellitus ([facility]   . Iron deficiency anemia 12/05/2018  . Duodenal ulcer 12/05/2018  . Gastric erosion 12/05/2018  . History of cholecystectomy 12/05/2018  . History of ERCP 12/05/2018  . Iron deficiency anemia due to chronic blood loss   . Erosive gastritis    . Acute lower GI bleeding 11/09/2018  . Acute blood loss anemia 11/09/2018  . Symptomatic anemia 11/09/2018  . Choledocholithiasis with obstruction 10/23/2018  . Occipital stroke ([facility] 06/11/2016  . Paroxysmal atrial fibrillation ([facility] 06/11/2016  . Homonymous hemianopsia due to old embolic stroke 185/63/1497 . Loss of peripheral visual field 03/12/2016  . Ataxia 03/12/2016  . Dizziness and giddiness 03/12/2016  . Cerebrovascular accident (CVA) due to embolism of posterior cerebral artery with infarctions of both occipital lobes ([facility] 03/12/2016  . Acute on chronic diastolic CHF (congestive heart failure) ([facility] 02/07/2016  . 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 Severe aortic stenosis   . Atrial fibrillation, persistent (HS[place]   . Mitral regurgitation   . Interstitial lung disease ([facility] 10/30/2014  . Hematochezia 12/13/2013  . CKD (chronic kidney disease), stage III ([facility] 12/13/2013  . Hyperthyroidism 12/13/2013  . GERD (gastroesophageal reflux disease) 12/13/2013  . Chronic anticoagulation 12/13/2013  . Elevated brain natriuretic peptide (BNP) level 12/13/2013  . Sinus bradycardia 12/13/2013  . Pneumonia 12/13/2013  . Encounter for therapeutic drug monitoring 08/29/2013  . Essential hypertension 08/05/2013  . Edema of both ankles 08/05/2013  . Bradycardia 11/27/2011   PAST MEDICAL HISTORY:  Active Ambulatory Problems    Diagnosis Date Noted  . Bradycardia 11/27/2011  . Essential hypertension 08/05/2013  . Edema of both ankles 08/05/2013  . Encounter for therapeutic drug monitoring 08/29/2013  . Hematochezia 12/13/2013  .  CKD (chronic kidney disease), stage III ([place]) 12/13/2013  . Hyperthyroidism 12/13/2013  . GERD (gastroesophageal reflux disease) 12/13/2013  . Chronic anticoagulation 12/13/2013  . Elevated brain natriuretic peptide (BNP) level 12/13/2013  . Sinus bradycardia 12/13/2013  . Pneumonia 12/13/2013  . Interstitial lung disease ([place])  10/30/2014  . Atrial fibrillation, persistent ([place])   . Mitral regurgitation   . Severe aortic stenosis   . 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 Acute on chronic diastolic CHF (congestive heart failure) ([place]) 02/07/2016  . Loss of peripheral visual field 03/12/2016  . Ataxia 03/12/2016  . Dizziness and giddiness 03/12/2016  . Cerebrovascular accident (CVA) due to embolism of posterior cerebral artery with infarctions of both occipital lobes ([place]) 03/12/2016  . Occipital stroke ([place]) 06/11/2016  . Paroxysmal atrial fibrillation ([place]) 06/11/2016  . 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 Choledocholithiasis with obstruction 10/23/2018  . Acute lower GI bleeding 11/09/2018  . Acute blood loss anemia 11/09/2018  . Symptomatic anemia 11/09/2018  . Iron deficiency anemia due to chronic blood loss   . Erosive gastritis   . Iron deficiency anemia 12/05/2018  . Duodenal ulcer 12/05/2018  . Gastric erosion 12/05/2018  . History of cholecystectomy 12/05/2018  . History of ERCP 12/05/2018  . Rhabdomyolysis 06/17/2020  . Diabetes mellitus ([place])   . Lactic acidosis 06/17/2020  . Dysphagia 12/01/2020  . Acute CVA (cerebrovascular accident) ([place]) 12/02/2020   Resolved Ambulatory Problems    Diagnosis Date Noted  . CAP (community acquired pneumonia) 12/12/2013   Past Medical History:  Diagnosis Date  . 1st degree AV block   . Chronic diastolic heart failure ([place]) 02/07/2016  . Colon, diverticulosis   . Diabetes mellitus   . HOH (hard of hearing)   . Hx of orthostatic hypotension   . LBBB (left bundle branch block)   . Long term current use of anticoagulant therapy   . Mediastinal goiter   . Mild CAD   . Osteopenia   . Osteoporosis   . Stroke ([place])   . Tachycardia-bradycardia ([place])   . Vitamin D deficiency    SOCIAL HX:  Social History   Tobacco Use  . Smoking status: Never Smoker  . Smokeless tobacco: Never Used  Substance Use  Topics  . Alcohol use: Not Currently   FAMILY HX:  Family History  Problem Relation Age of Onset  . Diabetes Mother   . Cancer - Prostate Father   . Pulmonary embolism Father   . Hypertension Sister   . Diabetes Sister   . Hypertension Brother   . Arrhythmia Brother        afib  . CAD Brother   . Pulmonary fibrosis Brother   . Colon cancer Neg Hx   . Esophageal cancer Neg Hx   . Inflammatory bowel disease Neg Hx   . Liver disease Neg Hx   . Pancreatic cancer Neg Hx   . Stomach cancer Neg Hx   . Rectal cancer Neg Hx       ALLERGIES:  Allergies  Allergen Reactions  . Amoxicillin Other (See Comments)    tongue felt bruised Did it involve swelling of the face/tongue/throat, SOB, or low BP? No Did it involve sudden or severe rash/hives, skin peeling, or any reaction on the inside of your mouth or nose? No Did you need to seek medical attention at a hospital or doctor's office? No When did it last happen?childhood If all above answers are "NO", may proceed with  cephalosporin use.    . Celebrex [Celecoxib] Other (See Comments)    Mouth sores  . Amoxicillin Other (See Comments)    "Made my tongue felt bruised"  . Celecoxib Other (See Comments)    Made the mouth sore     PERTINENT MEDICATIONS:  Outpatient Encounter Medications as of 12/11/2020  Medication Sig  . acetaminophen (TYLENOL) 500 MG tablet Take 1-2 tablets (500-1,000 mg total) by mouth every 6 (six) hours as needed for mild pain or moderate pain (or headaches).  [name] atorvastatin (LIPITOR) 10 MG tablet Take 10 mg by mouth daily.  . Cyanocobalamin (VITAMIN B-12 PO) Take 1 tablet by mouth daily.  [name] ELIQUIS 2.5 MG TABS tablet TAKE 1 TABLET(2.5 MG) BY MOUTH TWICE DAILY  . feeding supplement (ENSURE ENLIVE / ENSURE PLUS) LIQD Take 237 mLs by mouth 2 (two) times daily between meals.  . furosemide (LASIX) 20 MG tablet Take 1 tablet (20 mg total) by mouth daily as needed for fluid or edema (as needed for shortness of  breath, lower extremity swelling, or weight gain 3 lbs in 48 hrs or 5 lbs in 7 days.).  [name] methimazole (TAPAZOLE) 5 MG tablet Take 2.5 mg by mouth in the morning.  . Multiple Vitamins-Minerals (OCUVITE PRESERVISION PO) Take 1 capsule by mouth in the morning.  . pantoprazole (PROTONIX) 40 MG tablet TAKE 1 TABLET(40 MG) BY MOUTH DAILY AT 6 AM (Patient taking differently: Take 40 mg by mouth daily with breakfast.)  . vitamin B-12 (CYANOCOBALAMIN) 1000 MCG tablet Take 1,000 mcg by mouth daily with breakfast.    No facility-administered encounter medications on file as of 12/11/2020.    Thank you for the opportunity to participate in the care of Ms. Biggers.  The palliative care team will continue to follow. Please call our office at [phone] if we can be of additional assistance.   Ezekiel Slocumb, NP   COVID-19 PATIENT SCREENING TOOL Asked and negative response unless otherwise noted:   Have you had symptoms of covid, tested positive or been in contact with someone with symptoms/positive test in the past 5-10 days? No

## 2020-12-12 LAB — MUSK ANTIBODIES: MuSK Antibodies: <1 U/mL

## 2020-12-13 DIAGNOSIS — R5381 Other malaise: Secondary | ICD-10-CM | POA: Diagnosis not present

## 2020-12-13 DIAGNOSIS — I69391 Dysphagia following cerebral infarction: Secondary | ICD-10-CM | POA: Diagnosis not present

## 2020-12-13 DIAGNOSIS — E0590 Thyrotoxicosis, unspecified without thyrotoxic crisis or storm: Secondary | ICD-10-CM | POA: Diagnosis not present

## 2020-12-13 DIAGNOSIS — I129 Hypertensive chronic kidney disease with stage 1 through stage 4 chronic kidney disease, or unspecified chronic kidney disease: Secondary | ICD-10-CM | POA: Diagnosis not present

## 2020-12-17 ENCOUNTER — Other Ambulatory Visit: Payer: Self-pay | Admitting: *Deleted

## 2020-12-17 NOTE — Patient Outreach (Signed)
Member screened for potential [facility] Care Management needs. Verified in [place] (Patient Alexa Mcdonald) member resides in [facility].   Communication sent to [facility] to inquire about transition plans.   Will continue to follow for potential [facility] Care Management services while member resides in SNF.    Marthenia Rolling, MSN, RN,BSN [place] Acute Care Coordinator [phone] [facility]) [phone]  (Toll free office)

## 2020-12-21 ENCOUNTER — Other Ambulatory Visit: Payer: Self-pay | Admitting: *Deleted

## 2020-12-21 NOTE — Patient Outreach (Signed)
Member screened for potential [facility] Care Management needs. Mrs. Joffe resides in [place].   Update received from [facility] indicating transition plan is to return to ALF vs remaining LTC. Palliative care is following.   No identifiable [facility] Care Management needs at this time.    Marthenia Rolling, MSN, RN,BSN [place] Acute Care Coordinator [phone] [facility]) [phone]  (Toll free office)

## 2021-01-07 DIAGNOSIS — E0590 Thyrotoxicosis, unspecified without thyrotoxic crisis or storm: Secondary | ICD-10-CM | POA: Diagnosis not present

## 2021-01-07 DIAGNOSIS — E785 Hyperlipidemia, unspecified: Secondary | ICD-10-CM | POA: Diagnosis not present

## 2021-01-07 DIAGNOSIS — I482 Chronic atrial fibrillation, unspecified: Secondary | ICD-10-CM | POA: Diagnosis not present

## 2021-01-07 DIAGNOSIS — R5381 Other malaise: Secondary | ICD-10-CM | POA: Diagnosis not present

## 2021-01-09 ENCOUNTER — Other Ambulatory Visit: Payer: Self-pay | Admitting: *Deleted

## 2021-01-09 NOTE — Patient Outreach (Signed)
[facility] Coordinator follow up.   Update received from [facility] indicating member transitioned back to [facility] on 01/08/21 with care services in place.   No [facility] Care Management needs identified.    Marthenia Rolling, MSN, RN,BSN [place] Acute Care Coordinator [phone] [facility]) [phone]  (Toll free office)

## 2021-01-10 DIAGNOSIS — Z79899 Other long term (current) drug therapy: Secondary | ICD-10-CM | POA: Diagnosis not present

## 2021-01-10 DIAGNOSIS — I4819 Other persistent atrial fibrillation: Secondary | ICD-10-CM | POA: Diagnosis not present

## 2021-01-10 DIAGNOSIS — E0590 Thyrotoxicosis, unspecified without thyrotoxic crisis or storm: Secondary | ICD-10-CM | POA: Diagnosis not present

## 2021-01-10 DIAGNOSIS — I890 Lymphedema, not elsewhere classified: Secondary | ICD-10-CM | POA: Diagnosis not present

## 2021-01-10 DIAGNOSIS — E46 Unspecified protein-calorie malnutrition: Secondary | ICD-10-CM | POA: Diagnosis not present

## 2021-01-10 DIAGNOSIS — Z8709 Personal history of other diseases of the respiratory system: Secondary | ICD-10-CM | POA: Diagnosis not present

## 2021-01-10 DIAGNOSIS — I639 Cerebral infarction, unspecified: Secondary | ICD-10-CM | POA: Diagnosis not present

## 2021-01-10 DIAGNOSIS — D6869 Other thrombophilia: Secondary | ICD-10-CM | POA: Diagnosis not present

## 2021-01-14 DIAGNOSIS — Z1159 Encounter for screening for other viral diseases: Secondary | ICD-10-CM | POA: Diagnosis not present

## 2021-01-14 DIAGNOSIS — Z20828 Contact with and (suspected) exposure to other viral communicable diseases: Secondary | ICD-10-CM | POA: Diagnosis not present

## 2021-01-15 DIAGNOSIS — I69391 Dysphagia following cerebral infarction: Secondary | ICD-10-CM | POA: Diagnosis not present

## 2021-01-15 DIAGNOSIS — R488 Other symbolic dysfunctions: Secondary | ICD-10-CM | POA: Diagnosis not present

## 2021-01-15 DIAGNOSIS — M6281 Muscle weakness (generalized): Secondary | ICD-10-CM | POA: Diagnosis not present

## 2021-01-15 DIAGNOSIS — R262 Difficulty in walking, not elsewhere classified: Secondary | ICD-10-CM | POA: Diagnosis not present

## 2021-01-15 DIAGNOSIS — R1312 Dysphagia, oropharyngeal phase: Secondary | ICD-10-CM | POA: Diagnosis not present

## 2021-01-15 DIAGNOSIS — I69398 Other sequelae of cerebral infarction: Secondary | ICD-10-CM | POA: Diagnosis not present

## 2021-01-15 DIAGNOSIS — I69828 Other speech and language deficits following other cerebrovascular disease: Secondary | ICD-10-CM | POA: Diagnosis not present

## 2021-01-16 DIAGNOSIS — M6281 Muscle weakness (generalized): Secondary | ICD-10-CM | POA: Diagnosis not present

## 2021-01-16 DIAGNOSIS — R488 Other symbolic dysfunctions: Secondary | ICD-10-CM | POA: Diagnosis not present

## 2021-01-16 DIAGNOSIS — R262 Difficulty in walking, not elsewhere classified: Secondary | ICD-10-CM | POA: Diagnosis not present

## 2021-01-16 DIAGNOSIS — I69828 Other speech and language deficits following other cerebrovascular disease: Secondary | ICD-10-CM | POA: Diagnosis not present

## 2021-01-16 DIAGNOSIS — R1312 Dysphagia, oropharyngeal phase: Secondary | ICD-10-CM | POA: Diagnosis not present

## 2021-01-16 DIAGNOSIS — I69391 Dysphagia following cerebral infarction: Secondary | ICD-10-CM | POA: Diagnosis not present

## 2021-01-17 ENCOUNTER — Other Ambulatory Visit: Payer: Self-pay | Admitting: Cardiology

## 2021-01-17 DIAGNOSIS — M6281 Muscle weakness (generalized): Secondary | ICD-10-CM | POA: Diagnosis not present

## 2021-01-17 DIAGNOSIS — R488 Other symbolic dysfunctions: Secondary | ICD-10-CM | POA: Diagnosis not present

## 2021-01-17 DIAGNOSIS — I69391 Dysphagia following cerebral infarction: Secondary | ICD-10-CM | POA: Diagnosis not present

## 2021-01-17 DIAGNOSIS — I69828 Other speech and language deficits following other cerebrovascular disease: Secondary | ICD-10-CM | POA: Diagnosis not present

## 2021-01-17 DIAGNOSIS — R262 Difficulty in walking, not elsewhere classified: Secondary | ICD-10-CM | POA: Diagnosis not present

## 2021-01-17 DIAGNOSIS — R1312 Dysphagia, oropharyngeal phase: Secondary | ICD-10-CM | POA: Diagnosis not present

## 2021-01-17 NOTE — Telephone Encounter (Signed)
Age 87, weight 50kg, scr 1.3 on 12/05/20, afib, last visit jan 2021 and overdue for follow up. Will send in 1 mo rx and msg to scheduling to schedule annual follow up

## 2021-01-18 ENCOUNTER — Other Ambulatory Visit: Payer: Self-pay | Admitting: Cardiology

## 2021-01-18 DIAGNOSIS — I69391 Dysphagia following cerebral infarction: Secondary | ICD-10-CM | POA: Diagnosis not present

## 2021-01-18 DIAGNOSIS — M6281 Muscle weakness (generalized): Secondary | ICD-10-CM | POA: Diagnosis not present

## 2021-01-18 DIAGNOSIS — R1312 Dysphagia, oropharyngeal phase: Secondary | ICD-10-CM | POA: Diagnosis not present

## 2021-01-18 DIAGNOSIS — R262 Difficulty in walking, not elsewhere classified: Secondary | ICD-10-CM | POA: Diagnosis not present

## 2021-01-18 DIAGNOSIS — I69828 Other speech and language deficits following other cerebrovascular disease: Secondary | ICD-10-CM | POA: Diagnosis not present

## 2021-01-18 DIAGNOSIS — R488 Other symbolic dysfunctions: Secondary | ICD-10-CM | POA: Diagnosis not present

## 2021-01-19 DIAGNOSIS — R262 Difficulty in walking, not elsewhere classified: Secondary | ICD-10-CM | POA: Diagnosis not present

## 2021-01-19 DIAGNOSIS — R488 Other symbolic dysfunctions: Secondary | ICD-10-CM | POA: Diagnosis not present

## 2021-01-19 DIAGNOSIS — M6281 Muscle weakness (generalized): Secondary | ICD-10-CM | POA: Diagnosis not present

## 2021-01-19 DIAGNOSIS — I69828 Other speech and language deficits following other cerebrovascular disease: Secondary | ICD-10-CM | POA: Diagnosis not present

## 2021-01-19 DIAGNOSIS — I69391 Dysphagia following cerebral infarction: Secondary | ICD-10-CM | POA: Diagnosis not present

## 2021-01-19 DIAGNOSIS — R1312 Dysphagia, oropharyngeal phase: Secondary | ICD-10-CM | POA: Diagnosis not present

## 2021-01-21 DIAGNOSIS — Z1159 Encounter for screening for other viral diseases: Secondary | ICD-10-CM | POA: Diagnosis not present

## 2021-01-21 DIAGNOSIS — R262 Difficulty in walking, not elsewhere classified: Secondary | ICD-10-CM | POA: Diagnosis not present

## 2021-01-21 DIAGNOSIS — I69391 Dysphagia following cerebral infarction: Secondary | ICD-10-CM | POA: Diagnosis not present

## 2021-01-21 DIAGNOSIS — R488 Other symbolic dysfunctions: Secondary | ICD-10-CM | POA: Diagnosis not present

## 2021-01-21 DIAGNOSIS — R1312 Dysphagia, oropharyngeal phase: Secondary | ICD-10-CM | POA: Diagnosis not present

## 2021-01-21 DIAGNOSIS — Z20828 Contact with and (suspected) exposure to other viral communicable diseases: Secondary | ICD-10-CM | POA: Diagnosis not present

## 2021-01-21 DIAGNOSIS — I69828 Other speech and language deficits following other cerebrovascular disease: Secondary | ICD-10-CM | POA: Diagnosis not present

## 2021-01-21 DIAGNOSIS — M6281 Muscle weakness (generalized): Secondary | ICD-10-CM | POA: Diagnosis not present

## 2021-01-22 DIAGNOSIS — I69391 Dysphagia following cerebral infarction: Secondary | ICD-10-CM | POA: Diagnosis not present

## 2021-01-22 DIAGNOSIS — R488 Other symbolic dysfunctions: Secondary | ICD-10-CM | POA: Diagnosis not present

## 2021-01-22 DIAGNOSIS — R262 Difficulty in walking, not elsewhere classified: Secondary | ICD-10-CM | POA: Diagnosis not present

## 2021-01-22 DIAGNOSIS — M6281 Muscle weakness (generalized): Secondary | ICD-10-CM | POA: Diagnosis not present

## 2021-01-22 DIAGNOSIS — R1312 Dysphagia, oropharyngeal phase: Secondary | ICD-10-CM | POA: Diagnosis not present

## 2021-01-22 DIAGNOSIS — I69828 Other speech and language deficits following other cerebrovascular disease: Secondary | ICD-10-CM | POA: Diagnosis not present

## 2021-01-23 DIAGNOSIS — R1312 Dysphagia, oropharyngeal phase: Secondary | ICD-10-CM | POA: Diagnosis not present

## 2021-01-23 DIAGNOSIS — R488 Other symbolic dysfunctions: Secondary | ICD-10-CM | POA: Diagnosis not present

## 2021-01-23 DIAGNOSIS — I69828 Other speech and language deficits following other cerebrovascular disease: Secondary | ICD-10-CM | POA: Diagnosis not present

## 2021-01-23 DIAGNOSIS — I69391 Dysphagia following cerebral infarction: Secondary | ICD-10-CM | POA: Diagnosis not present

## 2021-01-23 DIAGNOSIS — R262 Difficulty in walking, not elsewhere classified: Secondary | ICD-10-CM | POA: Diagnosis not present

## 2021-01-23 DIAGNOSIS — M6281 Muscle weakness (generalized): Secondary | ICD-10-CM | POA: Diagnosis not present

## 2021-01-24 DIAGNOSIS — M6281 Muscle weakness (generalized): Secondary | ICD-10-CM | POA: Diagnosis not present

## 2021-01-24 DIAGNOSIS — I69391 Dysphagia following cerebral infarction: Secondary | ICD-10-CM | POA: Diagnosis not present

## 2021-01-24 DIAGNOSIS — R262 Difficulty in walking, not elsewhere classified: Secondary | ICD-10-CM | POA: Diagnosis not present

## 2021-01-24 DIAGNOSIS — R1312 Dysphagia, oropharyngeal phase: Secondary | ICD-10-CM | POA: Diagnosis not present

## 2021-01-24 DIAGNOSIS — I69828 Other speech and language deficits following other cerebrovascular disease: Secondary | ICD-10-CM | POA: Diagnosis not present

## 2021-01-24 DIAGNOSIS — R488 Other symbolic dysfunctions: Secondary | ICD-10-CM | POA: Diagnosis not present

## 2021-01-25 DIAGNOSIS — R488 Other symbolic dysfunctions: Secondary | ICD-10-CM | POA: Diagnosis not present

## 2021-01-25 DIAGNOSIS — I69828 Other speech and language deficits following other cerebrovascular disease: Secondary | ICD-10-CM | POA: Diagnosis not present

## 2021-01-25 DIAGNOSIS — I69391 Dysphagia following cerebral infarction: Secondary | ICD-10-CM | POA: Diagnosis not present

## 2021-01-25 DIAGNOSIS — R1312 Dysphagia, oropharyngeal phase: Secondary | ICD-10-CM | POA: Diagnosis not present

## 2021-01-25 DIAGNOSIS — R262 Difficulty in walking, not elsewhere classified: Secondary | ICD-10-CM | POA: Diagnosis not present

## 2021-01-25 DIAGNOSIS — M6281 Muscle weakness (generalized): Secondary | ICD-10-CM | POA: Diagnosis not present

## 2021-01-28 DIAGNOSIS — Z20828 Contact with and (suspected) exposure to other viral communicable diseases: Secondary | ICD-10-CM | POA: Diagnosis not present

## 2021-01-28 DIAGNOSIS — I69391 Dysphagia following cerebral infarction: Secondary | ICD-10-CM | POA: Diagnosis not present

## 2021-01-28 DIAGNOSIS — M6281 Muscle weakness (generalized): Secondary | ICD-10-CM | POA: Diagnosis not present

## 2021-01-28 DIAGNOSIS — R488 Other symbolic dysfunctions: Secondary | ICD-10-CM | POA: Diagnosis not present

## 2021-01-28 DIAGNOSIS — Z1159 Encounter for screening for other viral diseases: Secondary | ICD-10-CM | POA: Diagnosis not present

## 2021-01-28 DIAGNOSIS — R1312 Dysphagia, oropharyngeal phase: Secondary | ICD-10-CM | POA: Diagnosis not present

## 2021-01-28 DIAGNOSIS — R262 Difficulty in walking, not elsewhere classified: Secondary | ICD-10-CM | POA: Diagnosis not present

## 2021-01-28 DIAGNOSIS — I69828 Other speech and language deficits following other cerebrovascular disease: Secondary | ICD-10-CM | POA: Diagnosis not present

## 2021-01-29 DIAGNOSIS — R488 Other symbolic dysfunctions: Secondary | ICD-10-CM | POA: Diagnosis not present

## 2021-01-29 DIAGNOSIS — I69828 Other speech and language deficits following other cerebrovascular disease: Secondary | ICD-10-CM | POA: Diagnosis not present

## 2021-01-29 DIAGNOSIS — I69391 Dysphagia following cerebral infarction: Secondary | ICD-10-CM | POA: Diagnosis not present

## 2021-01-29 DIAGNOSIS — M6281 Muscle weakness (generalized): Secondary | ICD-10-CM | POA: Diagnosis not present

## 2021-01-29 DIAGNOSIS — R262 Difficulty in walking, not elsewhere classified: Secondary | ICD-10-CM | POA: Diagnosis not present

## 2021-01-29 DIAGNOSIS — R1312 Dysphagia, oropharyngeal phase: Secondary | ICD-10-CM | POA: Diagnosis not present

## 2021-01-30 DIAGNOSIS — M6281 Muscle weakness (generalized): Secondary | ICD-10-CM | POA: Diagnosis not present

## 2021-01-30 DIAGNOSIS — R488 Other symbolic dysfunctions: Secondary | ICD-10-CM | POA: Diagnosis not present

## 2021-01-30 DIAGNOSIS — I69391 Dysphagia following cerebral infarction: Secondary | ICD-10-CM | POA: Diagnosis not present

## 2021-01-30 DIAGNOSIS — R1312 Dysphagia, oropharyngeal phase: Secondary | ICD-10-CM | POA: Diagnosis not present

## 2021-01-30 DIAGNOSIS — R262 Difficulty in walking, not elsewhere classified: Secondary | ICD-10-CM | POA: Diagnosis not present

## 2021-01-30 DIAGNOSIS — I69828 Other speech and language deficits following other cerebrovascular disease: Secondary | ICD-10-CM | POA: Diagnosis not present

## 2021-01-31 DIAGNOSIS — R488 Other symbolic dysfunctions: Secondary | ICD-10-CM | POA: Diagnosis not present

## 2021-01-31 DIAGNOSIS — R262 Difficulty in walking, not elsewhere classified: Secondary | ICD-10-CM | POA: Diagnosis not present

## 2021-01-31 DIAGNOSIS — M6281 Muscle weakness (generalized): Secondary | ICD-10-CM | POA: Diagnosis not present

## 2021-01-31 DIAGNOSIS — I69828 Other speech and language deficits following other cerebrovascular disease: Secondary | ICD-10-CM | POA: Diagnosis not present

## 2021-01-31 DIAGNOSIS — I69391 Dysphagia following cerebral infarction: Secondary | ICD-10-CM | POA: Diagnosis not present

## 2021-01-31 DIAGNOSIS — R1312 Dysphagia, oropharyngeal phase: Secondary | ICD-10-CM | POA: Diagnosis not present

## 2021-02-04 DIAGNOSIS — Z1159 Encounter for screening for other viral diseases: Secondary | ICD-10-CM | POA: Diagnosis not present

## 2021-02-04 DIAGNOSIS — Z20828 Contact with and (suspected) exposure to other viral communicable diseases: Secondary | ICD-10-CM | POA: Diagnosis not present

## 2021-02-05 DIAGNOSIS — R262 Difficulty in walking, not elsewhere classified: Secondary | ICD-10-CM | POA: Diagnosis not present

## 2021-02-05 DIAGNOSIS — R488 Other symbolic dysfunctions: Secondary | ICD-10-CM | POA: Diagnosis not present

## 2021-02-05 DIAGNOSIS — I69398 Other sequelae of cerebral infarction: Secondary | ICD-10-CM | POA: Diagnosis not present

## 2021-02-05 DIAGNOSIS — M6281 Muscle weakness (generalized): Secondary | ICD-10-CM | POA: Diagnosis not present

## 2021-02-05 DIAGNOSIS — R1312 Dysphagia, oropharyngeal phase: Secondary | ICD-10-CM | POA: Diagnosis not present

## 2021-02-05 DIAGNOSIS — I69828 Other speech and language deficits following other cerebrovascular disease: Secondary | ICD-10-CM | POA: Diagnosis not present

## 2021-02-05 DIAGNOSIS — I69391 Dysphagia following cerebral infarction: Secondary | ICD-10-CM | POA: Diagnosis not present

## 2021-02-07 DIAGNOSIS — I69391 Dysphagia following cerebral infarction: Secondary | ICD-10-CM | POA: Diagnosis not present

## 2021-02-07 DIAGNOSIS — R262 Difficulty in walking, not elsewhere classified: Secondary | ICD-10-CM | POA: Diagnosis not present

## 2021-02-07 DIAGNOSIS — R488 Other symbolic dysfunctions: Secondary | ICD-10-CM | POA: Diagnosis not present

## 2021-02-07 DIAGNOSIS — I69828 Other speech and language deficits following other cerebrovascular disease: Secondary | ICD-10-CM | POA: Diagnosis not present

## 2021-02-07 DIAGNOSIS — R1312 Dysphagia, oropharyngeal phase: Secondary | ICD-10-CM | POA: Diagnosis not present

## 2021-02-07 DIAGNOSIS — M6281 Muscle weakness (generalized): Secondary | ICD-10-CM | POA: Diagnosis not present

## 2021-02-08 DIAGNOSIS — R1312 Dysphagia, oropharyngeal phase: Secondary | ICD-10-CM | POA: Diagnosis not present

## 2021-02-08 DIAGNOSIS — R262 Difficulty in walking, not elsewhere classified: Secondary | ICD-10-CM | POA: Diagnosis not present

## 2021-02-08 DIAGNOSIS — I69391 Dysphagia following cerebral infarction: Secondary | ICD-10-CM | POA: Diagnosis not present

## 2021-02-08 DIAGNOSIS — I69828 Other speech and language deficits following other cerebrovascular disease: Secondary | ICD-10-CM | POA: Diagnosis not present

## 2021-02-08 DIAGNOSIS — R488 Other symbolic dysfunctions: Secondary | ICD-10-CM | POA: Diagnosis not present

## 2021-02-08 DIAGNOSIS — M6281 Muscle weakness (generalized): Secondary | ICD-10-CM | POA: Diagnosis not present

## 2021-02-11 DIAGNOSIS — I69391 Dysphagia following cerebral infarction: Secondary | ICD-10-CM | POA: Diagnosis not present

## 2021-02-11 DIAGNOSIS — R1312 Dysphagia, oropharyngeal phase: Secondary | ICD-10-CM | POA: Diagnosis not present

## 2021-02-11 DIAGNOSIS — R488 Other symbolic dysfunctions: Secondary | ICD-10-CM | POA: Diagnosis not present

## 2021-02-11 DIAGNOSIS — M6281 Muscle weakness (generalized): Secondary | ICD-10-CM | POA: Diagnosis not present

## 2021-02-11 DIAGNOSIS — I69828 Other speech and language deficits following other cerebrovascular disease: Secondary | ICD-10-CM | POA: Diagnosis not present

## 2021-02-11 DIAGNOSIS — R262 Difficulty in walking, not elsewhere classified: Secondary | ICD-10-CM | POA: Diagnosis not present

## 2021-02-12 DIAGNOSIS — I69828 Other speech and language deficits following other cerebrovascular disease: Secondary | ICD-10-CM | POA: Diagnosis not present

## 2021-02-12 DIAGNOSIS — R1312 Dysphagia, oropharyngeal phase: Secondary | ICD-10-CM | POA: Diagnosis not present

## 2021-02-12 DIAGNOSIS — M6281 Muscle weakness (generalized): Secondary | ICD-10-CM | POA: Diagnosis not present

## 2021-02-12 DIAGNOSIS — R262 Difficulty in walking, not elsewhere classified: Secondary | ICD-10-CM | POA: Diagnosis not present

## 2021-02-12 DIAGNOSIS — R488 Other symbolic dysfunctions: Secondary | ICD-10-CM | POA: Diagnosis not present

## 2021-02-12 DIAGNOSIS — I69391 Dysphagia following cerebral infarction: Secondary | ICD-10-CM | POA: Diagnosis not present

## 2021-02-13 DIAGNOSIS — I69391 Dysphagia following cerebral infarction: Secondary | ICD-10-CM | POA: Diagnosis not present

## 2021-02-13 DIAGNOSIS — R488 Other symbolic dysfunctions: Secondary | ICD-10-CM | POA: Diagnosis not present

## 2021-02-13 DIAGNOSIS — I69828 Other speech and language deficits following other cerebrovascular disease: Secondary | ICD-10-CM | POA: Diagnosis not present

## 2021-02-13 DIAGNOSIS — R262 Difficulty in walking, not elsewhere classified: Secondary | ICD-10-CM | POA: Diagnosis not present

## 2021-02-13 DIAGNOSIS — R1312 Dysphagia, oropharyngeal phase: Secondary | ICD-10-CM | POA: Diagnosis not present

## 2021-02-13 DIAGNOSIS — M6281 Muscle weakness (generalized): Secondary | ICD-10-CM | POA: Diagnosis not present

## 2021-02-14 DIAGNOSIS — M6281 Muscle weakness (generalized): Secondary | ICD-10-CM | POA: Diagnosis not present

## 2021-02-14 DIAGNOSIS — I69828 Other speech and language deficits following other cerebrovascular disease: Secondary | ICD-10-CM | POA: Diagnosis not present

## 2021-02-14 DIAGNOSIS — I69391 Dysphagia following cerebral infarction: Secondary | ICD-10-CM | POA: Diagnosis not present

## 2021-02-14 DIAGNOSIS — R488 Other symbolic dysfunctions: Secondary | ICD-10-CM | POA: Diagnosis not present

## 2021-02-14 DIAGNOSIS — R262 Difficulty in walking, not elsewhere classified: Secondary | ICD-10-CM | POA: Diagnosis not present

## 2021-02-14 DIAGNOSIS — R1312 Dysphagia, oropharyngeal phase: Secondary | ICD-10-CM | POA: Diagnosis not present

## 2021-02-15 DIAGNOSIS — I69828 Other speech and language deficits following other cerebrovascular disease: Secondary | ICD-10-CM | POA: Diagnosis not present

## 2021-02-15 DIAGNOSIS — R488 Other symbolic dysfunctions: Secondary | ICD-10-CM | POA: Diagnosis not present

## 2021-02-15 DIAGNOSIS — I69391 Dysphagia following cerebral infarction: Secondary | ICD-10-CM | POA: Diagnosis not present

## 2021-02-15 DIAGNOSIS — R1312 Dysphagia, oropharyngeal phase: Secondary | ICD-10-CM | POA: Diagnosis not present

## 2021-02-15 DIAGNOSIS — R262 Difficulty in walking, not elsewhere classified: Secondary | ICD-10-CM | POA: Diagnosis not present

## 2021-02-15 DIAGNOSIS — M6281 Muscle weakness (generalized): Secondary | ICD-10-CM | POA: Diagnosis not present

## 2021-02-18 DIAGNOSIS — Z1159 Encounter for screening for other viral diseases: Secondary | ICD-10-CM | POA: Diagnosis not present

## 2021-02-18 DIAGNOSIS — R488 Other symbolic dysfunctions: Secondary | ICD-10-CM | POA: Diagnosis not present

## 2021-02-18 DIAGNOSIS — Z20828 Contact with and (suspected) exposure to other viral communicable diseases: Secondary | ICD-10-CM | POA: Diagnosis not present

## 2021-02-18 DIAGNOSIS — R262 Difficulty in walking, not elsewhere classified: Secondary | ICD-10-CM | POA: Diagnosis not present

## 2021-02-18 DIAGNOSIS — I69391 Dysphagia following cerebral infarction: Secondary | ICD-10-CM | POA: Diagnosis not present

## 2021-02-18 DIAGNOSIS — M6281 Muscle weakness (generalized): Secondary | ICD-10-CM | POA: Diagnosis not present

## 2021-02-18 DIAGNOSIS — I69828 Other speech and language deficits following other cerebrovascular disease: Secondary | ICD-10-CM | POA: Diagnosis not present

## 2021-02-18 DIAGNOSIS — R1312 Dysphagia, oropharyngeal phase: Secondary | ICD-10-CM | POA: Diagnosis not present

## 2021-02-19 DIAGNOSIS — R262 Difficulty in walking, not elsewhere classified: Secondary | ICD-10-CM | POA: Diagnosis not present

## 2021-02-19 DIAGNOSIS — I69391 Dysphagia following cerebral infarction: Secondary | ICD-10-CM | POA: Diagnosis not present

## 2021-02-19 DIAGNOSIS — I69828 Other speech and language deficits following other cerebrovascular disease: Secondary | ICD-10-CM | POA: Diagnosis not present

## 2021-02-19 DIAGNOSIS — R488 Other symbolic dysfunctions: Secondary | ICD-10-CM | POA: Diagnosis not present

## 2021-02-19 DIAGNOSIS — R1312 Dysphagia, oropharyngeal phase: Secondary | ICD-10-CM | POA: Diagnosis not present

## 2021-02-19 DIAGNOSIS — M6281 Muscle weakness (generalized): Secondary | ICD-10-CM | POA: Diagnosis not present

## 2021-02-20 DIAGNOSIS — R488 Other symbolic dysfunctions: Secondary | ICD-10-CM | POA: Diagnosis not present

## 2021-02-20 DIAGNOSIS — R262 Difficulty in walking, not elsewhere classified: Secondary | ICD-10-CM | POA: Diagnosis not present

## 2021-02-20 DIAGNOSIS — I69828 Other speech and language deficits following other cerebrovascular disease: Secondary | ICD-10-CM | POA: Diagnosis not present

## 2021-02-20 DIAGNOSIS — R1312 Dysphagia, oropharyngeal phase: Secondary | ICD-10-CM | POA: Diagnosis not present

## 2021-02-20 DIAGNOSIS — M6281 Muscle weakness (generalized): Secondary | ICD-10-CM | POA: Diagnosis not present

## 2021-02-20 DIAGNOSIS — I69391 Dysphagia following cerebral infarction: Secondary | ICD-10-CM | POA: Diagnosis not present

## 2021-02-21 DIAGNOSIS — I69391 Dysphagia following cerebral infarction: Secondary | ICD-10-CM | POA: Diagnosis not present

## 2021-02-21 DIAGNOSIS — M6281 Muscle weakness (generalized): Secondary | ICD-10-CM | POA: Diagnosis not present

## 2021-02-21 DIAGNOSIS — I69828 Other speech and language deficits following other cerebrovascular disease: Secondary | ICD-10-CM | POA: Diagnosis not present

## 2021-02-21 DIAGNOSIS — R1312 Dysphagia, oropharyngeal phase: Secondary | ICD-10-CM | POA: Diagnosis not present

## 2021-02-21 DIAGNOSIS — R488 Other symbolic dysfunctions: Secondary | ICD-10-CM | POA: Diagnosis not present

## 2021-02-21 DIAGNOSIS — R262 Difficulty in walking, not elsewhere classified: Secondary | ICD-10-CM | POA: Diagnosis not present

## 2021-02-25 DIAGNOSIS — I69391 Dysphagia following cerebral infarction: Secondary | ICD-10-CM | POA: Diagnosis not present

## 2021-02-25 DIAGNOSIS — R488 Other symbolic dysfunctions: Secondary | ICD-10-CM | POA: Diagnosis not present

## 2021-02-25 DIAGNOSIS — R262 Difficulty in walking, not elsewhere classified: Secondary | ICD-10-CM | POA: Diagnosis not present

## 2021-02-25 DIAGNOSIS — M6281 Muscle weakness (generalized): Secondary | ICD-10-CM | POA: Diagnosis not present

## 2021-02-25 DIAGNOSIS — R1312 Dysphagia, oropharyngeal phase: Secondary | ICD-10-CM | POA: Diagnosis not present

## 2021-02-25 DIAGNOSIS — I69828 Other speech and language deficits following other cerebrovascular disease: Secondary | ICD-10-CM | POA: Diagnosis not present

## 2021-02-27 DIAGNOSIS — R1312 Dysphagia, oropharyngeal phase: Secondary | ICD-10-CM | POA: Diagnosis not present

## 2021-02-27 DIAGNOSIS — R488 Other symbolic dysfunctions: Secondary | ICD-10-CM | POA: Diagnosis not present

## 2021-02-27 DIAGNOSIS — R262 Difficulty in walking, not elsewhere classified: Secondary | ICD-10-CM | POA: Diagnosis not present

## 2021-02-27 DIAGNOSIS — I69391 Dysphagia following cerebral infarction: Secondary | ICD-10-CM | POA: Diagnosis not present

## 2021-02-27 DIAGNOSIS — M6281 Muscle weakness (generalized): Secondary | ICD-10-CM | POA: Diagnosis not present

## 2021-02-27 DIAGNOSIS — I69828 Other speech and language deficits following other cerebrovascular disease: Secondary | ICD-10-CM | POA: Diagnosis not present

## 2021-02-28 DIAGNOSIS — I69391 Dysphagia following cerebral infarction: Secondary | ICD-10-CM | POA: Diagnosis not present

## 2021-02-28 DIAGNOSIS — R262 Difficulty in walking, not elsewhere classified: Secondary | ICD-10-CM | POA: Diagnosis not present

## 2021-02-28 DIAGNOSIS — R1312 Dysphagia, oropharyngeal phase: Secondary | ICD-10-CM | POA: Diagnosis not present

## 2021-02-28 DIAGNOSIS — R488 Other symbolic dysfunctions: Secondary | ICD-10-CM | POA: Diagnosis not present

## 2021-02-28 DIAGNOSIS — I69828 Other speech and language deficits following other cerebrovascular disease: Secondary | ICD-10-CM | POA: Diagnosis not present

## 2021-02-28 DIAGNOSIS — M6281 Muscle weakness (generalized): Secondary | ICD-10-CM | POA: Diagnosis not present

## 2021-03-01 DIAGNOSIS — M6281 Muscle weakness (generalized): Secondary | ICD-10-CM | POA: Diagnosis not present

## 2021-03-01 DIAGNOSIS — R1312 Dysphagia, oropharyngeal phase: Secondary | ICD-10-CM | POA: Diagnosis not present

## 2021-03-01 DIAGNOSIS — R488 Other symbolic dysfunctions: Secondary | ICD-10-CM | POA: Diagnosis not present

## 2021-03-01 DIAGNOSIS — R262 Difficulty in walking, not elsewhere classified: Secondary | ICD-10-CM | POA: Diagnosis not present

## 2021-03-01 DIAGNOSIS — I69828 Other speech and language deficits following other cerebrovascular disease: Secondary | ICD-10-CM | POA: Diagnosis not present

## 2021-03-01 DIAGNOSIS — I69391 Dysphagia following cerebral infarction: Secondary | ICD-10-CM | POA: Diagnosis not present

## 2021-03-04 DIAGNOSIS — I69828 Other speech and language deficits following other cerebrovascular disease: Secondary | ICD-10-CM | POA: Diagnosis not present

## 2021-03-04 DIAGNOSIS — I69398 Other sequelae of cerebral infarction: Secondary | ICD-10-CM | POA: Diagnosis not present

## 2021-03-04 DIAGNOSIS — Z1159 Encounter for screening for other viral diseases: Secondary | ICD-10-CM | POA: Diagnosis not present

## 2021-03-04 DIAGNOSIS — R1312 Dysphagia, oropharyngeal phase: Secondary | ICD-10-CM | POA: Diagnosis not present

## 2021-03-04 DIAGNOSIS — Z20828 Contact with and (suspected) exposure to other viral communicable diseases: Secondary | ICD-10-CM | POA: Diagnosis not present

## 2021-03-04 DIAGNOSIS — I69391 Dysphagia following cerebral infarction: Secondary | ICD-10-CM | POA: Diagnosis not present

## 2021-03-04 DIAGNOSIS — R262 Difficulty in walking, not elsewhere classified: Secondary | ICD-10-CM | POA: Diagnosis not present

## 2021-03-04 DIAGNOSIS — R488 Other symbolic dysfunctions: Secondary | ICD-10-CM | POA: Diagnosis not present

## 2021-03-04 DIAGNOSIS — M6281 Muscle weakness (generalized): Secondary | ICD-10-CM | POA: Diagnosis not present

## 2021-03-05 DIAGNOSIS — I69391 Dysphagia following cerebral infarction: Secondary | ICD-10-CM | POA: Diagnosis not present

## 2021-03-05 DIAGNOSIS — M6281 Muscle weakness (generalized): Secondary | ICD-10-CM | POA: Diagnosis not present

## 2021-03-05 DIAGNOSIS — R488 Other symbolic dysfunctions: Secondary | ICD-10-CM | POA: Diagnosis not present

## 2021-03-05 DIAGNOSIS — I69828 Other speech and language deficits following other cerebrovascular disease: Secondary | ICD-10-CM | POA: Diagnosis not present

## 2021-03-05 DIAGNOSIS — R262 Difficulty in walking, not elsewhere classified: Secondary | ICD-10-CM | POA: Diagnosis not present

## 2021-03-05 DIAGNOSIS — R1312 Dysphagia, oropharyngeal phase: Secondary | ICD-10-CM | POA: Diagnosis not present

## 2021-03-07 DIAGNOSIS — I352 Nonrheumatic aortic (valve) stenosis with insufficiency: Secondary | ICD-10-CM | POA: Diagnosis not present

## 2021-03-07 DIAGNOSIS — I890 Lymphedema, not elsewhere classified: Secondary | ICD-10-CM | POA: Diagnosis not present

## 2021-03-07 DIAGNOSIS — E46 Unspecified protein-calorie malnutrition: Secondary | ICD-10-CM | POA: Diagnosis not present

## 2021-03-07 DIAGNOSIS — I471 Supraventricular tachycardia: Secondary | ICD-10-CM | POA: Diagnosis not present

## 2021-03-07 DIAGNOSIS — L0390 Cellulitis, unspecified: Secondary | ICD-10-CM | POA: Diagnosis not present

## 2021-03-07 DIAGNOSIS — R488 Other symbolic dysfunctions: Secondary | ICD-10-CM | POA: Diagnosis not present

## 2021-03-07 DIAGNOSIS — R1312 Dysphagia, oropharyngeal phase: Secondary | ICD-10-CM | POA: Diagnosis not present

## 2021-03-07 DIAGNOSIS — E0520 Thyrotoxicosis with toxic multinodular goiter without thyrotoxic crisis or storm: Secondary | ICD-10-CM | POA: Diagnosis not present

## 2021-03-07 DIAGNOSIS — E1122 Type 2 diabetes mellitus with diabetic chronic kidney disease: Secondary | ICD-10-CM | POA: Diagnosis not present

## 2021-03-07 DIAGNOSIS — L8990 Pressure ulcer of unspecified site, unspecified stage: Secondary | ICD-10-CM | POA: Diagnosis not present

## 2021-03-07 DIAGNOSIS — I69391 Dysphagia following cerebral infarction: Secondary | ICD-10-CM | POA: Diagnosis not present

## 2021-03-07 DIAGNOSIS — N183 Chronic kidney disease, stage 3 unspecified: Secondary | ICD-10-CM | POA: Diagnosis not present

## 2021-03-07 DIAGNOSIS — E0590 Thyrotoxicosis, unspecified without thyrotoxic crisis or storm: Secondary | ICD-10-CM | POA: Diagnosis not present

## 2021-03-07 DIAGNOSIS — I5032 Chronic diastolic (congestive) heart failure: Secondary | ICD-10-CM | POA: Diagnosis not present

## 2021-03-07 DIAGNOSIS — I4819 Other persistent atrial fibrillation: Secondary | ICD-10-CM | POA: Diagnosis not present

## 2021-03-07 DIAGNOSIS — M6281 Muscle weakness (generalized): Secondary | ICD-10-CM | POA: Diagnosis not present

## 2021-03-07 DIAGNOSIS — I69828 Other speech and language deficits following other cerebrovascular disease: Secondary | ICD-10-CM | POA: Diagnosis not present

## 2021-03-07 DIAGNOSIS — R262 Difficulty in walking, not elsewhere classified: Secondary | ICD-10-CM | POA: Diagnosis not present

## 2021-03-08 DIAGNOSIS — I69391 Dysphagia following cerebral infarction: Secondary | ICD-10-CM | POA: Diagnosis not present

## 2021-03-08 DIAGNOSIS — I69828 Other speech and language deficits following other cerebrovascular disease: Secondary | ICD-10-CM | POA: Diagnosis not present

## 2021-03-08 DIAGNOSIS — R1312 Dysphagia, oropharyngeal phase: Secondary | ICD-10-CM | POA: Diagnosis not present

## 2021-03-08 DIAGNOSIS — R488 Other symbolic dysfunctions: Secondary | ICD-10-CM | POA: Diagnosis not present

## 2021-03-08 DIAGNOSIS — M6281 Muscle weakness (generalized): Secondary | ICD-10-CM | POA: Diagnosis not present

## 2021-03-08 DIAGNOSIS — R262 Difficulty in walking, not elsewhere classified: Secondary | ICD-10-CM | POA: Diagnosis not present

## 2021-03-11 DIAGNOSIS — I69391 Dysphagia following cerebral infarction: Secondary | ICD-10-CM | POA: Diagnosis not present

## 2021-03-11 DIAGNOSIS — M6281 Muscle weakness (generalized): Secondary | ICD-10-CM | POA: Diagnosis not present

## 2021-03-11 DIAGNOSIS — R488 Other symbolic dysfunctions: Secondary | ICD-10-CM | POA: Diagnosis not present

## 2021-03-11 DIAGNOSIS — R262 Difficulty in walking, not elsewhere classified: Secondary | ICD-10-CM | POA: Diagnosis not present

## 2021-03-11 DIAGNOSIS — R1312 Dysphagia, oropharyngeal phase: Secondary | ICD-10-CM | POA: Diagnosis not present

## 2021-03-11 DIAGNOSIS — I69828 Other speech and language deficits following other cerebrovascular disease: Secondary | ICD-10-CM | POA: Diagnosis not present

## 2021-03-12 DIAGNOSIS — I69828 Other speech and language deficits following other cerebrovascular disease: Secondary | ICD-10-CM | POA: Diagnosis not present

## 2021-03-12 DIAGNOSIS — R1312 Dysphagia, oropharyngeal phase: Secondary | ICD-10-CM | POA: Diagnosis not present

## 2021-03-12 DIAGNOSIS — I69391 Dysphagia following cerebral infarction: Secondary | ICD-10-CM | POA: Diagnosis not present

## 2021-03-16 DIAGNOSIS — N1832 Chronic kidney disease, stage 3b: Secondary | ICD-10-CM | POA: Diagnosis not present

## 2021-03-16 DIAGNOSIS — E1122 Type 2 diabetes mellitus with diabetic chronic kidney disease: Secondary | ICD-10-CM | POA: Diagnosis not present

## 2021-03-16 DIAGNOSIS — I951 Orthostatic hypotension: Secondary | ICD-10-CM | POA: Diagnosis not present

## 2021-03-16 DIAGNOSIS — M8580 Other specified disorders of bone density and structure, unspecified site: Secondary | ICD-10-CM | POA: Diagnosis not present

## 2021-03-16 DIAGNOSIS — I340 Nonrheumatic mitral (valve) insufficiency: Secondary | ICD-10-CM | POA: Diagnosis not present

## 2021-03-16 DIAGNOSIS — I4819 Other persistent atrial fibrillation: Secondary | ICD-10-CM | POA: Diagnosis not present

## 2021-03-16 DIAGNOSIS — M810 Age-related osteoporosis without current pathological fracture: Secondary | ICD-10-CM | POA: Diagnosis not present

## 2021-03-16 DIAGNOSIS — E46 Unspecified protein-calorie malnutrition: Secondary | ICD-10-CM | POA: Diagnosis not present

## 2021-03-16 DIAGNOSIS — I471 Supraventricular tachycardia: Secondary | ICD-10-CM | POA: Diagnosis not present

## 2021-03-16 DIAGNOSIS — E1151 Type 2 diabetes mellitus with diabetic peripheral angiopathy without gangrene: Secondary | ICD-10-CM | POA: Diagnosis not present

## 2021-03-16 DIAGNOSIS — L97222 Non-pressure chronic ulcer of left calf with fat layer exposed: Secondary | ICD-10-CM | POA: Diagnosis not present

## 2021-03-16 DIAGNOSIS — I890 Lymphedema, not elsewhere classified: Secondary | ICD-10-CM | POA: Diagnosis not present

## 2021-03-16 DIAGNOSIS — K219 Gastro-esophageal reflux disease without esophagitis: Secondary | ICD-10-CM | POA: Diagnosis not present

## 2021-03-16 DIAGNOSIS — I495 Sick sinus syndrome: Secondary | ICD-10-CM | POA: Diagnosis not present

## 2021-03-16 DIAGNOSIS — E041 Nontoxic single thyroid nodule: Secondary | ICD-10-CM | POA: Diagnosis not present

## 2021-03-16 DIAGNOSIS — H9193 Unspecified hearing loss, bilateral: Secondary | ICD-10-CM | POA: Diagnosis not present

## 2021-03-16 DIAGNOSIS — I69391 Dysphagia following cerebral infarction: Secondary | ICD-10-CM | POA: Diagnosis not present

## 2021-03-16 DIAGNOSIS — D692 Other nonthrombocytopenic purpura: Secondary | ICD-10-CM | POA: Diagnosis not present

## 2021-03-16 DIAGNOSIS — D6869 Other thrombophilia: Secondary | ICD-10-CM | POA: Diagnosis not present

## 2021-03-16 DIAGNOSIS — I130 Hypertensive heart and chronic kidney disease with heart failure and stage 1 through stage 4 chronic kidney disease, or unspecified chronic kidney disease: Secondary | ICD-10-CM | POA: Diagnosis not present

## 2021-03-16 DIAGNOSIS — I447 Left bundle-branch block, unspecified: Secondary | ICD-10-CM | POA: Diagnosis not present

## 2021-03-16 DIAGNOSIS — R32 Unspecified urinary incontinence: Secondary | ICD-10-CM | POA: Diagnosis not present

## 2021-03-16 DIAGNOSIS — Z4800 Encounter for change or removal of nonsurgical wound dressing: Secondary | ICD-10-CM | POA: Diagnosis not present

## 2021-03-16 DIAGNOSIS — I872 Venous insufficiency (chronic) (peripheral): Secondary | ICD-10-CM | POA: Diagnosis not present

## 2021-03-16 DIAGNOSIS — I5032 Chronic diastolic (congestive) heart failure: Secondary | ICD-10-CM | POA: Diagnosis not present

## 2021-03-18 DIAGNOSIS — Z8616 Personal history of COVID-19: Secondary | ICD-10-CM | POA: Diagnosis not present

## 2021-03-20 DIAGNOSIS — Z4800 Encounter for change or removal of nonsurgical wound dressing: Secondary | ICD-10-CM | POA: Diagnosis not present

## 2021-03-20 DIAGNOSIS — I872 Venous insufficiency (chronic) (peripheral): Secondary | ICD-10-CM | POA: Diagnosis not present

## 2021-03-20 DIAGNOSIS — L97222 Non-pressure chronic ulcer of left calf with fat layer exposed: Secondary | ICD-10-CM | POA: Diagnosis not present

## 2021-03-20 DIAGNOSIS — I890 Lymphedema, not elsewhere classified: Secondary | ICD-10-CM | POA: Diagnosis not present

## 2021-03-20 DIAGNOSIS — E1151 Type 2 diabetes mellitus with diabetic peripheral angiopathy without gangrene: Secondary | ICD-10-CM | POA: Diagnosis not present

## 2021-03-20 DIAGNOSIS — I4819 Other persistent atrial fibrillation: Secondary | ICD-10-CM | POA: Diagnosis not present

## 2021-03-21 DIAGNOSIS — N390 Urinary tract infection, site not specified: Secondary | ICD-10-CM | POA: Diagnosis not present

## 2021-03-23 DIAGNOSIS — Z4800 Encounter for change or removal of nonsurgical wound dressing: Secondary | ICD-10-CM | POA: Diagnosis not present

## 2021-03-23 DIAGNOSIS — E1151 Type 2 diabetes mellitus with diabetic peripheral angiopathy without gangrene: Secondary | ICD-10-CM | POA: Diagnosis not present

## 2021-03-23 DIAGNOSIS — L97222 Non-pressure chronic ulcer of left calf with fat layer exposed: Secondary | ICD-10-CM | POA: Diagnosis not present

## 2021-03-23 DIAGNOSIS — I4819 Other persistent atrial fibrillation: Secondary | ICD-10-CM | POA: Diagnosis not present

## 2021-03-23 DIAGNOSIS — I890 Lymphedema, not elsewhere classified: Secondary | ICD-10-CM | POA: Diagnosis not present

## 2021-03-23 DIAGNOSIS — I872 Venous insufficiency (chronic) (peripheral): Secondary | ICD-10-CM | POA: Diagnosis not present

## 2021-03-26 DIAGNOSIS — I890 Lymphedema, not elsewhere classified: Secondary | ICD-10-CM | POA: Diagnosis not present

## 2021-03-26 DIAGNOSIS — I4819 Other persistent atrial fibrillation: Secondary | ICD-10-CM | POA: Diagnosis not present

## 2021-03-26 DIAGNOSIS — Z4800 Encounter for change or removal of nonsurgical wound dressing: Secondary | ICD-10-CM | POA: Diagnosis not present

## 2021-03-26 DIAGNOSIS — I872 Venous insufficiency (chronic) (peripheral): Secondary | ICD-10-CM | POA: Diagnosis not present

## 2021-03-26 DIAGNOSIS — E1151 Type 2 diabetes mellitus with diabetic peripheral angiopathy without gangrene: Secondary | ICD-10-CM | POA: Diagnosis not present

## 2021-03-26 DIAGNOSIS — L97222 Non-pressure chronic ulcer of left calf with fat layer exposed: Secondary | ICD-10-CM | POA: Diagnosis not present

## 2021-03-27 DIAGNOSIS — I890 Lymphedema, not elsewhere classified: Secondary | ICD-10-CM | POA: Diagnosis not present

## 2021-03-27 DIAGNOSIS — I872 Venous insufficiency (chronic) (peripheral): Secondary | ICD-10-CM | POA: Diagnosis not present

## 2021-03-27 DIAGNOSIS — L97222 Non-pressure chronic ulcer of left calf with fat layer exposed: Secondary | ICD-10-CM | POA: Diagnosis not present

## 2021-03-27 DIAGNOSIS — E1151 Type 2 diabetes mellitus with diabetic peripheral angiopathy without gangrene: Secondary | ICD-10-CM | POA: Diagnosis not present

## 2021-03-27 DIAGNOSIS — Z4800 Encounter for change or removal of nonsurgical wound dressing: Secondary | ICD-10-CM | POA: Diagnosis not present

## 2021-03-27 DIAGNOSIS — I4819 Other persistent atrial fibrillation: Secondary | ICD-10-CM | POA: Diagnosis not present

## 2021-03-28 DIAGNOSIS — I872 Venous insufficiency (chronic) (peripheral): Secondary | ICD-10-CM | POA: Diagnosis not present

## 2021-03-28 DIAGNOSIS — I4819 Other persistent atrial fibrillation: Secondary | ICD-10-CM | POA: Diagnosis not present

## 2021-03-28 DIAGNOSIS — L97222 Non-pressure chronic ulcer of left calf with fat layer exposed: Secondary | ICD-10-CM | POA: Diagnosis not present

## 2021-03-28 DIAGNOSIS — Z4800 Encounter for change or removal of nonsurgical wound dressing: Secondary | ICD-10-CM | POA: Diagnosis not present

## 2021-03-28 DIAGNOSIS — E1151 Type 2 diabetes mellitus with diabetic peripheral angiopathy without gangrene: Secondary | ICD-10-CM | POA: Diagnosis not present

## 2021-03-28 DIAGNOSIS — I890 Lymphedema, not elsewhere classified: Secondary | ICD-10-CM | POA: Diagnosis not present

## 2021-03-29 DIAGNOSIS — L97222 Non-pressure chronic ulcer of left calf with fat layer exposed: Secondary | ICD-10-CM | POA: Diagnosis not present

## 2021-03-29 DIAGNOSIS — I890 Lymphedema, not elsewhere classified: Secondary | ICD-10-CM | POA: Diagnosis not present

## 2021-03-29 DIAGNOSIS — Z4800 Encounter for change or removal of nonsurgical wound dressing: Secondary | ICD-10-CM | POA: Diagnosis not present

## 2021-03-29 DIAGNOSIS — I872 Venous insufficiency (chronic) (peripheral): Secondary | ICD-10-CM | POA: Diagnosis not present

## 2021-03-29 DIAGNOSIS — I4819 Other persistent atrial fibrillation: Secondary | ICD-10-CM | POA: Diagnosis not present

## 2021-03-29 DIAGNOSIS — E1151 Type 2 diabetes mellitus with diabetic peripheral angiopathy without gangrene: Secondary | ICD-10-CM | POA: Diagnosis not present

## 2021-04-01 DIAGNOSIS — Z20828 Contact with and (suspected) exposure to other viral communicable diseases: Secondary | ICD-10-CM | POA: Diagnosis not present

## 2021-04-02 DIAGNOSIS — L97222 Non-pressure chronic ulcer of left calf with fat layer exposed: Secondary | ICD-10-CM | POA: Diagnosis not present

## 2021-04-02 DIAGNOSIS — I872 Venous insufficiency (chronic) (peripheral): Secondary | ICD-10-CM | POA: Diagnosis not present

## 2021-04-02 DIAGNOSIS — I4819 Other persistent atrial fibrillation: Secondary | ICD-10-CM | POA: Diagnosis not present

## 2021-04-02 DIAGNOSIS — Z4800 Encounter for change or removal of nonsurgical wound dressing: Secondary | ICD-10-CM | POA: Diagnosis not present

## 2021-04-02 DIAGNOSIS — I890 Lymphedema, not elsewhere classified: Secondary | ICD-10-CM | POA: Diagnosis not present

## 2021-04-02 DIAGNOSIS — E1151 Type 2 diabetes mellitus with diabetic peripheral angiopathy without gangrene: Secondary | ICD-10-CM | POA: Diagnosis not present

## 2021-04-03 DIAGNOSIS — I890 Lymphedema, not elsewhere classified: Secondary | ICD-10-CM | POA: Diagnosis not present

## 2021-04-03 DIAGNOSIS — I4819 Other persistent atrial fibrillation: Secondary | ICD-10-CM | POA: Diagnosis not present

## 2021-04-03 DIAGNOSIS — L97222 Non-pressure chronic ulcer of left calf with fat layer exposed: Secondary | ICD-10-CM | POA: Diagnosis not present

## 2021-04-03 DIAGNOSIS — I872 Venous insufficiency (chronic) (peripheral): Secondary | ICD-10-CM | POA: Diagnosis not present

## 2021-04-03 DIAGNOSIS — Z4800 Encounter for change or removal of nonsurgical wound dressing: Secondary | ICD-10-CM | POA: Diagnosis not present

## 2021-04-03 DIAGNOSIS — E1151 Type 2 diabetes mellitus with diabetic peripheral angiopathy without gangrene: Secondary | ICD-10-CM | POA: Diagnosis not present

## 2021-04-05 DIAGNOSIS — I872 Venous insufficiency (chronic) (peripheral): Secondary | ICD-10-CM | POA: Diagnosis not present

## 2021-04-05 DIAGNOSIS — L97222 Non-pressure chronic ulcer of left calf with fat layer exposed: Secondary | ICD-10-CM | POA: Diagnosis not present

## 2021-04-05 DIAGNOSIS — I4819 Other persistent atrial fibrillation: Secondary | ICD-10-CM | POA: Diagnosis not present

## 2021-04-05 DIAGNOSIS — I890 Lymphedema, not elsewhere classified: Secondary | ICD-10-CM | POA: Diagnosis not present

## 2021-04-05 DIAGNOSIS — E1151 Type 2 diabetes mellitus with diabetic peripheral angiopathy without gangrene: Secondary | ICD-10-CM | POA: Diagnosis not present

## 2021-04-05 DIAGNOSIS — Z4800 Encounter for change or removal of nonsurgical wound dressing: Secondary | ICD-10-CM | POA: Diagnosis not present

## 2021-04-08 DIAGNOSIS — Z8616 Personal history of COVID-19: Secondary | ICD-10-CM | POA: Diagnosis not present

## 2021-04-09 DIAGNOSIS — E1151 Type 2 diabetes mellitus with diabetic peripheral angiopathy without gangrene: Secondary | ICD-10-CM | POA: Diagnosis not present

## 2021-04-09 DIAGNOSIS — I872 Venous insufficiency (chronic) (peripheral): Secondary | ICD-10-CM | POA: Diagnosis not present

## 2021-04-09 DIAGNOSIS — Z4800 Encounter for change or removal of nonsurgical wound dressing: Secondary | ICD-10-CM | POA: Diagnosis not present

## 2021-04-09 DIAGNOSIS — L97222 Non-pressure chronic ulcer of left calf with fat layer exposed: Secondary | ICD-10-CM | POA: Diagnosis not present

## 2021-04-09 DIAGNOSIS — I890 Lymphedema, not elsewhere classified: Secondary | ICD-10-CM | POA: Diagnosis not present

## 2021-04-09 DIAGNOSIS — I4819 Other persistent atrial fibrillation: Secondary | ICD-10-CM | POA: Diagnosis not present

## 2021-04-11 ENCOUNTER — Telehealth: Payer: Self-pay

## 2021-04-11 DIAGNOSIS — I872 Venous insufficiency (chronic) (peripheral): Secondary | ICD-10-CM | POA: Diagnosis not present

## 2021-04-11 DIAGNOSIS — I890 Lymphedema, not elsewhere classified: Secondary | ICD-10-CM | POA: Diagnosis not present

## 2021-04-11 DIAGNOSIS — I4819 Other persistent atrial fibrillation: Secondary | ICD-10-CM | POA: Diagnosis not present

## 2021-04-11 DIAGNOSIS — E1151 Type 2 diabetes mellitus with diabetic peripheral angiopathy without gangrene: Secondary | ICD-10-CM | POA: Diagnosis not present

## 2021-04-11 DIAGNOSIS — L97222 Non-pressure chronic ulcer of left calf with fat layer exposed: Secondary | ICD-10-CM | POA: Diagnosis not present

## 2021-04-11 DIAGNOSIS — Z4800 Encounter for change or removal of nonsurgical wound dressing: Secondary | ICD-10-CM | POA: Diagnosis not present

## 2021-04-11 NOTE — Telephone Encounter (Signed)
Attempted to contact patient to schedule a Palliative Care consult appointment. No answer or voicemail. Will try to call back later.

## 2021-04-12 DIAGNOSIS — L97222 Non-pressure chronic ulcer of left calf with fat layer exposed: Secondary | ICD-10-CM | POA: Diagnosis not present

## 2021-04-12 DIAGNOSIS — Z4800 Encounter for change or removal of nonsurgical wound dressing: Secondary | ICD-10-CM | POA: Diagnosis not present

## 2021-04-12 DIAGNOSIS — I872 Venous insufficiency (chronic) (peripheral): Secondary | ICD-10-CM | POA: Diagnosis not present

## 2021-04-12 DIAGNOSIS — E1151 Type 2 diabetes mellitus with diabetic peripheral angiopathy without gangrene: Secondary | ICD-10-CM | POA: Diagnosis not present

## 2021-04-12 DIAGNOSIS — I4819 Other persistent atrial fibrillation: Secondary | ICD-10-CM | POA: Diagnosis not present

## 2021-04-12 DIAGNOSIS — I890 Lymphedema, not elsewhere classified: Secondary | ICD-10-CM | POA: Diagnosis not present

## 2021-04-13 ENCOUNTER — Other Ambulatory Visit: Payer: Self-pay

## 2021-04-13 ENCOUNTER — Emergency Department (HOSPITAL_COMMUNITY): Payer: Medicare Other

## 2021-04-13 ENCOUNTER — Encounter (HOSPITAL_COMMUNITY): Payer: Self-pay

## 2021-04-13 ENCOUNTER — Emergency Department (HOSPITAL_BASED_OUTPATIENT_CLINIC_OR_DEPARTMENT_OTHER)
Admit: 2021-04-13 | Discharge: 2021-04-13 | Disposition: A | Discharge status: Home / Self Care | Payer: Medicare Other | Attending: Emergency Medicine | Admitting: Emergency Medicine

## 2021-04-13 ENCOUNTER — Inpatient Hospital Stay (HOSPITAL_COMMUNITY)
Admission: EM | Admit: 2021-04-13 | Discharge: 2021-04-16 | DRG: 291 | Disposition: A | Discharge status: Hospice | Payer: Medicare Other | Source: Skilled Nursing Facility | Attending: Family Medicine | Admitting: Family Medicine

## 2021-04-13 DIAGNOSIS — I447 Left bundle-branch block, unspecified: Secondary | ICD-10-CM | POA: Diagnosis present

## 2021-04-13 DIAGNOSIS — Z7189 Other specified counseling: Secondary | ICD-10-CM | POA: Diagnosis not present

## 2021-04-13 DIAGNOSIS — R0902 Hypoxemia: Secondary | ICD-10-CM | POA: Diagnosis not present

## 2021-04-13 DIAGNOSIS — R001 Bradycardia, unspecified: Secondary | ICD-10-CM | POA: Diagnosis not present

## 2021-04-13 DIAGNOSIS — I110 Hypertensive heart disease with heart failure: Secondary | ICD-10-CM | POA: Diagnosis not present

## 2021-04-13 DIAGNOSIS — I088 Other rheumatic multiple valve diseases: Secondary | ICD-10-CM | POA: Diagnosis present

## 2021-04-13 DIAGNOSIS — Z880 Allergy status to penicillin: Secondary | ICD-10-CM

## 2021-04-13 DIAGNOSIS — M7989 Other specified soft tissue disorders: Secondary | ICD-10-CM | POA: Diagnosis not present

## 2021-04-13 DIAGNOSIS — R404 Transient alteration of awareness: Secondary | ICD-10-CM | POA: Diagnosis not present

## 2021-04-13 DIAGNOSIS — F0280 Dementia in other diseases classified elsewhere without behavioral disturbance: Secondary | ICD-10-CM | POA: Diagnosis not present

## 2021-04-13 DIAGNOSIS — R5381 Other malaise: Secondary | ICD-10-CM

## 2021-04-13 DIAGNOSIS — Z974 Presence of external hearing-aid: Secondary | ICD-10-CM

## 2021-04-13 DIAGNOSIS — N1832 Chronic kidney disease, stage 3b: Secondary | ICD-10-CM | POA: Diagnosis not present

## 2021-04-13 DIAGNOSIS — Z66 Do not resuscitate: Secondary | ICD-10-CM | POA: Diagnosis present

## 2021-04-13 DIAGNOSIS — Z9841 Cataract extraction status, right eye: Secondary | ICD-10-CM

## 2021-04-13 DIAGNOSIS — Z20822 Contact with and (suspected) exposure to covid-19: Secondary | ICD-10-CM | POA: Diagnosis not present

## 2021-04-13 DIAGNOSIS — L89611 Pressure ulcer of right heel, stage 1: Secondary | ICD-10-CM | POA: Diagnosis present

## 2021-04-13 DIAGNOSIS — I5031 Acute diastolic (congestive) heart failure: Secondary | ICD-10-CM | POA: Diagnosis present

## 2021-04-13 DIAGNOSIS — J90 Pleural effusion, not elsewhere classified: Secondary | ICD-10-CM | POA: Diagnosis not present

## 2021-04-13 DIAGNOSIS — R627 Adult failure to thrive: Secondary | ICD-10-CM | POA: Diagnosis present

## 2021-04-13 DIAGNOSIS — R600 Localized edema: Secondary | ICD-10-CM | POA: Diagnosis not present

## 2021-04-13 DIAGNOSIS — G309 Alzheimer's disease, unspecified: Secondary | ICD-10-CM

## 2021-04-13 DIAGNOSIS — Z8673 Personal history of transient ischemic attack (TIA), and cerebral infarction without residual deficits: Secondary | ICD-10-CM

## 2021-04-13 DIAGNOSIS — L89152 Pressure ulcer of sacral region, stage 2: Secondary | ICD-10-CM | POA: Diagnosis present

## 2021-04-13 DIAGNOSIS — I509 Heart failure, unspecified: Secondary | ICD-10-CM | POA: Diagnosis not present

## 2021-04-13 DIAGNOSIS — I5033 Acute on chronic diastolic (congestive) heart failure: Secondary | ICD-10-CM | POA: Diagnosis not present

## 2021-04-13 DIAGNOSIS — J849 Interstitial pulmonary disease, unspecified: Secondary | ICD-10-CM | POA: Diagnosis present

## 2021-04-13 DIAGNOSIS — R296 Repeated falls: Secondary | ICD-10-CM | POA: Diagnosis present

## 2021-04-13 DIAGNOSIS — R069 Unspecified abnormalities of breathing: Secondary | ICD-10-CM | POA: Diagnosis not present

## 2021-04-13 DIAGNOSIS — Z515 Encounter for palliative care: Secondary | ICD-10-CM | POA: Diagnosis not present

## 2021-04-13 DIAGNOSIS — I130 Hypertensive heart and chronic kidney disease with heart failure and stage 1 through stage 4 chronic kidney disease, or unspecified chronic kidney disease: Principal | ICD-10-CM | POA: Diagnosis present

## 2021-04-13 DIAGNOSIS — I517 Cardiomegaly: Secondary | ICD-10-CM | POA: Diagnosis not present

## 2021-04-13 DIAGNOSIS — F0390 Unspecified dementia without behavioral disturbance: Secondary | ICD-10-CM | POA: Diagnosis present

## 2021-04-13 DIAGNOSIS — Z9049 Acquired absence of other specified parts of digestive tract: Secondary | ICD-10-CM

## 2021-04-13 DIAGNOSIS — R1312 Dysphagia, oropharyngeal phase: Secondary | ICD-10-CM | POA: Diagnosis present

## 2021-04-13 DIAGNOSIS — R1311 Dysphagia, oral phase: Secondary | ICD-10-CM | POA: Diagnosis present

## 2021-04-13 DIAGNOSIS — Z79899 Other long term (current) drug therapy: Secondary | ICD-10-CM

## 2021-04-13 DIAGNOSIS — H9190 Unspecified hearing loss, unspecified ear: Secondary | ICD-10-CM | POA: Diagnosis present

## 2021-04-13 DIAGNOSIS — R0602 Shortness of breath: Secondary | ICD-10-CM | POA: Diagnosis not present

## 2021-04-13 DIAGNOSIS — D696 Thrombocytopenia, unspecified: Secondary | ICD-10-CM | POA: Diagnosis present

## 2021-04-13 DIAGNOSIS — I4819 Other persistent atrial fibrillation: Secondary | ICD-10-CM | POA: Diagnosis not present

## 2021-04-13 DIAGNOSIS — R54 Age-related physical debility: Secondary | ICD-10-CM | POA: Diagnosis present

## 2021-04-13 DIAGNOSIS — R7989 Other specified abnormal findings of blood chemistry: Secondary | ICD-10-CM | POA: Diagnosis present

## 2021-04-13 DIAGNOSIS — Z833 Family history of diabetes mellitus: Secondary | ICD-10-CM

## 2021-04-13 DIAGNOSIS — M810 Age-related osteoporosis without current pathological fracture: Secondary | ICD-10-CM | POA: Diagnosis present

## 2021-04-13 DIAGNOSIS — Z953 Presence of xenogenic heart valve: Secondary | ICD-10-CM

## 2021-04-13 DIAGNOSIS — E0500 Thyrotoxicosis with diffuse goiter without thyrotoxic crisis or storm: Secondary | ICD-10-CM | POA: Diagnosis present

## 2021-04-13 DIAGNOSIS — J811 Chronic pulmonary edema: Secondary | ICD-10-CM | POA: Diagnosis not present

## 2021-04-13 DIAGNOSIS — E1122 Type 2 diabetes mellitus with diabetic chronic kidney disease: Secondary | ICD-10-CM | POA: Diagnosis present

## 2021-04-13 DIAGNOSIS — G9341 Metabolic encephalopathy: Secondary | ICD-10-CM | POA: Diagnosis not present

## 2021-04-13 DIAGNOSIS — L89621 Pressure ulcer of left heel, stage 1: Secondary | ICD-10-CM | POA: Diagnosis present

## 2021-04-13 DIAGNOSIS — I2510 Atherosclerotic heart disease of native coronary artery without angina pectoris: Secondary | ICD-10-CM | POA: Diagnosis present

## 2021-04-13 DIAGNOSIS — K219 Gastro-esophageal reflux disease without esophagitis: Secondary | ICD-10-CM | POA: Diagnosis present

## 2021-04-13 DIAGNOSIS — Z888 Allergy status to other drugs, medicaments and biological substances status: Secondary | ICD-10-CM

## 2021-04-13 DIAGNOSIS — I4891 Unspecified atrial fibrillation: Secondary | ICD-10-CM | POA: Diagnosis not present

## 2021-04-13 DIAGNOSIS — Z8249 Family history of ischemic heart disease and other diseases of the circulatory system: Secondary | ICD-10-CM

## 2021-04-13 DIAGNOSIS — L8990 Pressure ulcer of unspecified site, unspecified stage: Secondary | ICD-10-CM | POA: Diagnosis present

## 2021-04-13 DIAGNOSIS — R1314 Dysphagia, pharyngoesophageal phase: Secondary | ICD-10-CM | POA: Diagnosis present

## 2021-04-13 DIAGNOSIS — Z9842 Cataract extraction status, left eye: Secondary | ICD-10-CM

## 2021-04-13 DIAGNOSIS — E785 Hyperlipidemia, unspecified: Secondary | ICD-10-CM | POA: Diagnosis present

## 2021-04-13 LAB — CBC WITH DIFFERENTIAL/PLATELET
Abs Immature Granulocytes: 0.03 10*3/uL (ref 0.00–0.07)
Basophils Absolute: 0 10*3/uL (ref 0.0–0.1)
Basophils Relative: 1 %
Eosinophils Absolute: 0.2 10*3/uL (ref 0.0–0.5)
Eosinophils Relative: 2 %
HCT: 42.9 % (ref 36.0–46.0)
Hemoglobin: 13.9 g/dL (ref 12.0–15.0)
Immature Granulocytes: 0 %
Lymphocytes Relative: 10 %
Lymphs Abs: 0.7 10*3/uL (ref 0.7–4.0)
MCH: 31.2 pg (ref 26.0–34.0)
MCHC: 32.4 g/dL (ref 30.0–36.0)
MCV: 96.4 fL (ref 80.0–100.0)
Monocytes Absolute: 0.6 10*3/uL (ref 0.1–1.0)
Monocytes Relative: 8 %
Neutro Abs: 5.7 10*3/uL (ref 1.7–7.7)
Neutrophils Relative %: 79 %
Platelets: 141 10*3/uL — ABNORMAL LOW (ref 150–400)
RBC: 4.45 MIL/uL (ref 3.87–5.11)
RDW: 15.5 % (ref 11.5–15.5)
WBC: 7.2 10*3/uL (ref 4.0–10.5)
nRBC: 0 % (ref 0.0–0.2)

## 2021-04-13 LAB — APTT: aPTT: 31 seconds (ref 24–36)

## 2021-04-13 LAB — RESP PANEL BY RT-PCR (FLU A&B, COVID) ARPGX2
Influenza A by PCR: NEGATIVE
Influenza B by PCR: NEGATIVE
SARS Coronavirus 2 by RT PCR: NEGATIVE

## 2021-04-13 LAB — URINALYSIS, ROUTINE W REFLEX MICROSCOPIC
Bilirubin Urine: NEGATIVE
Glucose, UA: NEGATIVE mg/dL
Hgb urine dipstick: NEGATIVE
Leukocytes,Ua: NEGATIVE
Nitrite: NEGATIVE
Protein, ur: 100 mg/dL — AB
Specific Gravity, Urine: 1.025 (ref 1.005–1.030)
pH: 6 (ref 5.0–8.0)

## 2021-04-13 LAB — COMPREHENSIVE METABOLIC PANEL
ALT: 12 U/L (ref 0–44)
AST: 17 U/L (ref 15–41)
Albumin: 3.6 g/dL (ref 3.5–5.0)
Alkaline Phosphatase: 79 U/L (ref 38–126)
Anion gap: 8 (ref 5–15)
BUN: 36 mg/dL — ABNORMAL HIGH (ref 8–23)
CO2: 28 mmol/L (ref 22–32)
Calcium: 9.2 mg/dL (ref 8.9–10.3)
Chloride: 104 mmol/L (ref 98–111)
Creatinine, Ser: 1.58 mg/dL — ABNORMAL HIGH (ref 0.44–1.00)
GFR, Estimated: 30 mL/min — ABNORMAL LOW (ref >60)
Glucose, Bld: 136 mg/dL — ABNORMAL HIGH (ref 70–99)
Potassium: 4.4 mmol/L (ref 3.5–5.1)
Sodium: 140 mmol/L (ref 135–145)
Total Bilirubin: 1.5 mg/dL — ABNORMAL HIGH (ref 0.3–1.2)
Total Protein: 6.8 g/dL (ref 6.5–8.1)

## 2021-04-13 LAB — I-STAT CHEM 8, ED
BUN: 33 mg/dL — ABNORMAL HIGH (ref 8–23)
Calcium, Ion: 1.19 mmol/L (ref 1.15–1.40)
Chloride: 105 mmol/L (ref 98–111)
Creatinine, Ser: 1.6 mg/dL — ABNORMAL HIGH (ref 0.44–1.00)
Glucose, Bld: 130 mg/dL — ABNORMAL HIGH (ref 70–99)
HCT: 39 % (ref 36.0–46.0)
Hemoglobin: 13.3 g/dL (ref 12.0–15.0)
Potassium: 4.2 mmol/L (ref 3.5–5.1)
Sodium: 141 mmol/L (ref 135–145)
TCO2: 26 mmol/L (ref 22–32)

## 2021-04-13 LAB — T4, FREE: Free T4: 1.24 ng/dL — ABNORMAL HIGH (ref 0.61–1.12)

## 2021-04-13 LAB — BLOOD GAS, VENOUS
Acid-Base Excess: 3.4 mmol/L — ABNORMAL HIGH (ref 0.0–2.0)
Bicarbonate: 29.3 mmol/L — ABNORMAL HIGH (ref 20.0–28.0)
O2 Saturation: 38.7 %
Patient temperature: 98.6
pCO2, Ven: 52.7 mmHg (ref 44.0–60.0)
pH, Ven: 7.363 (ref 7.250–7.430)
pO2, Ven: 27.4 mmHg — CL (ref 32.0–45.0)

## 2021-04-13 LAB — TSH: TSH: 2.472 u[IU]/mL (ref 0.350–4.500)

## 2021-04-13 LAB — PROTIME-INR
INR: 1.6 — ABNORMAL HIGH (ref 0.8–1.2)
Prothrombin Time: 19.1 seconds — ABNORMAL HIGH (ref 11.4–15.2)

## 2021-04-13 LAB — CK: Total CK: 31 U/L — ABNORMAL LOW (ref 38–234)

## 2021-04-13 LAB — BRAIN NATRIURETIC PEPTIDE: B Natriuretic Peptide: 1172.8 pg/mL — ABNORMAL HIGH (ref 0.0–100.0)

## 2021-04-13 LAB — LACTIC ACID, PLASMA: Lactic Acid, Venous: 1.7 mmol/L (ref 0.5–1.9)

## 2021-04-13 MED ORDER — ONDANSETRON HCL 4 MG PO TABS: 4.0000 mg | ORAL_TABLET | Freq: Four times a day (QID) | ORAL | Status: DC | PRN

## 2021-04-13 MED ORDER — FUROSEMIDE 10 MG/ML IJ SOLN
40.0000 mg | Freq: Every day | INTRAMUSCULAR | Status: DC
  Administered 2021-04-14 – 2021-04-16 (×3): 40 mg via INTRAVENOUS
  Filled 2021-04-13 (×3): qty 4

## 2021-04-13 MED ORDER — ONDANSETRON HCL 4 MG/2ML IJ SOLN: 4.0000 mg | Freq: Four times a day (QID) | INTRAMUSCULAR | Status: DC | PRN

## 2021-04-13 MED ORDER — LACTATED RINGERS IV BOLUS (SEPSIS)
1000.0000 mL | Freq: Once | INTRAVENOUS | Status: AC
  Administered 2021-04-13: 1000 mL via INTRAVENOUS

## 2021-04-13 MED ORDER — FUROSEMIDE 10 MG/ML IJ SOLN
20.0000 mg | Freq: Once | INTRAMUSCULAR | Status: AC
  Administered 2021-04-13: 20 mg via INTRAVENOUS
  Filled 2021-04-13: qty 4

## 2021-04-13 MED ORDER — ALBUTEROL SULFATE (2.5 MG/3ML) 0.083% IN NEBU
2.5000 mg | INHALATION_SOLUTION | RESPIRATORY_TRACT | Status: DC | PRN
  Administered 2021-04-14: 2.5 mg via RESPIRATORY_TRACT

## 2021-04-13 MED ORDER — ACETAMINOPHEN 650 MG RE SUPP: 650.0000 mg | Freq: Four times a day (QID) | RECTAL | Status: DC | PRN

## 2021-04-13 MED ORDER — ALBUTEROL SULFATE HFA 108 (90 BASE) MCG/ACT IN AERS
2.0000 | INHALATION_SPRAY | RESPIRATORY_TRACT | Status: DC | PRN
  Filled 2021-04-13: qty 6.7

## 2021-04-13 MED ORDER — ENOXAPARIN SODIUM 30 MG/0.3ML IJ SOSY
30.0000 mg | PREFILLED_SYRINGE | INTRAMUSCULAR | Status: DC
  Administered 2021-04-13 – 2021-04-14 (×2): 30 mg via SUBCUTANEOUS
  Filled 2021-04-13 (×2): qty 0.3

## 2021-04-13 MED ORDER — ACETAMINOPHEN 325 MG PO TABS
650.0000 mg | ORAL_TABLET | Freq: Four times a day (QID) | ORAL | Status: DC | PRN
  Administered 2021-04-14: 650 mg via ORAL
  Filled 2021-04-13: qty 2

## 2021-04-13 MED ORDER — IOHEXOL 350 MG/ML SOLN
60.0000 mL | Freq: Once | INTRAVENOUS | Status: AC | PRN
  Administered 2021-04-13: 60 mL via INTRAVENOUS

## 2021-04-13 NOTE — H&P (Signed)
History and Physical    Alexa Mcdonald STM:[id number] DOB: 06/09/1921 DOA: 04/13/2021  PCP: Jonathon Jordan, MD Patient coming from: [place]  Chief Complaint: Shortness of breath  HPI: Alexa Mcdonald is a 87 y.o. female with history of dementia, diminished hearing, persistent A. fib off Eliquis due to falls, first degree AVB, diastolic CHF, GXQ-1J, LBBB, diet-controlled DM-2, hypothyroidism, AS s/p TAVR and CVA brought to ED with shortness of breath.  Patient with advanced dementia.  She is a sleepy but wakes to voice.  She is oriented to self, "[place]" and family member at bedside.  She responds no to pain or shortness of breath.  Per patient's son at bedside, patient has started "going down" over the last few weeks.  When asked to elaborate, has been weaker and easily fatigued.  She started having shortness of breath today.  Also noted leg swelling that comes and goes.  He says she takes Lasix every other day.  No significant change in oral intake until today.  She did not complain pain.  No report of nausea, vomiting, diarrhea, cough or bladder habit change.  Denies new medication.  At baseline, she is awake and oriented to self, place and family but not time.  She is independent for most ADL's.  She ambulates using walker.  Per patient's son, she has been off Eliquis due to falls.  She does not smoke, drink or use drugs.  She is DNR and DNI but patient's son is open to trial of ACLS meds.  Not interested in artificial feeding.  In ED, in A. fib with bradycardia to mid 30s on telemetry but recovered to mid 40s.  SBP in 150s.  DBP ranges from 30s to 50s.  No documented desaturation but started on 2 L by nasal cannula.  Platelet 141. Cr 1.6 (baseline).  BUN 36.  Glucose 136.  CK and lactic acid within normal.  PT/INR 19.1/1.6.  aPTT 31.  UA without significant finding.  Otherwise, CBC and CMP without significant finding.  Initial EKG A. fib at 56 bpm and LBBB (chronic).  Repeat EKG A.  fib at 39 bpm and LBBB.  VBG 7.3 6/52/27/39.  TSH within normal.  COVID-19 and influenza PCR nonreactive.  CXR concerning for mild CHF.  CT angio chest negative for PE but with evidence of mild CHF, elevated right heart pressure, PAH and stable large thyroid goiter.  Blood and urine cultures obtained.  Received 1 L LR bolus followed by IV Lasix 20 mg.  Hospitalist service consulted for bradycardia.  ROS All review of system negative except for pertinent positives and negatives as history of present illness above.  PMH Past Medical History:  Diagnosis Date   1st degree AV block    Bradycardia    Chronic diastolic heart failure ([place]) 02/07/2016   CKD (chronic kidney disease), stage III (HCC)    Colon, diverticulosis    Diabetes mellitus    diet controlled   Essential hypertension    GERD (gastroesophageal reflux disease)    Hematochezia    HOH (hard of hearing)    wears bilateral hearing aids   Hx of orthostatic hypotension    Interstitial lung disease (HCC)    LBBB (left bundle branch block)    Long term current use of anticoagulant therapy    Mediastinal goiter     recurrent s/p resection 1988   Mild CAD    Mitral regurgitation    Osteopenia    Osteoporosis    Paroxysmal atrial  fibrillation ([place])    a. CHA2DS2VASc = 5-->coumadin;  b. previously on amio->d/c'd 11/2014 secondary to concern for possible amio lung.   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   23 mm Edwards Sapien 3 transcatheter heart valve placed via percutaneous right transfemoral approach   Severe aortic stenosis    s/p TAVR with stable prosthesis by echo 08/2019 - mean AVG 27mmHg   Stroke ([place])    Tachycardia-bradycardia ([place])    Vitamin D deficiency    PSH Past Surgical History:  Procedure Laterality Date   BILIARY DILATION  10/24/2018   Procedure: BILIARY DILATION;  Surgeon: Jackquline Denmark, MD;  Location: [facility] ENDOSCOPY;  Service: Endoscopy;;   BIOPSY  11/11/2018   Procedure:  BIOPSY;  Surgeon: Irving Copas., MD;  Location: [facility] ENDOSCOPY;  Service: Gastroenterology;;   CARDIAC CATHETERIZATION N/A 10/31/2015   Procedure: Right/Left Heart Cath and Coronary Angiography;  Surgeon: [name], MD;  Location: [facility] CV LAB;  Service: Cardiovascular;  Laterality: N/A;   CARPAL TUNNEL RELEASE Bilateral    CATARACT EXTRACTION Bilateral    CESAREAN SECTION     x3   CHOLECYSTECTOMY N/A 10/25/2018   Procedure: LAPAROSCOPIC CHOLECYSTECTOMY WITH INTRAOPERATIVE CHOLANGIOGRAM;  Surgeon: Kieth Brightly Arta Bruce, MD;  Location: [facility] ORS;  Service: General;  Laterality: N/A;   ERCP N/A 10/24/2018   Procedure: ENDOSCOPIC RETROGRADE CHOLANGIOPANCREATOGRAPHY (ERCP);  Surgeon: Jackquline Denmark, MD;  Location: [facility];  Service: Endoscopy;  Laterality: N/A;   ESOPHAGOGASTRODUODENOSCOPY (EGD) WITH PROPOFOL N/A 11/11/2018   Procedure: ESOPHAGOGASTRODUODENOSCOPY (EGD) WITH PROPOFOL;  Surgeon: Rush Landmark Telford Nab., MD;  Location: [place];  Service: Gastroenterology;  Laterality: N/A;   mediastinal removal of a goiter     MRI  08/18/07   head (diabetes heart study at [facility])   [place]  10/24/2018   Procedure: REMOVAL OF STONES;  Surgeon: Jackquline Denmark, MD;  Location: [facility] ENDOS[facility];  Service: Endoscopy;;   SPHINCTEROTOMY  10/24/2018   Procedure: Joan Mayans;  Surgeon: Jackquline Denmark, MD;  Location: [facility];  Service: Endoscopy;;   TEE WITHOUT CARDIOVERSION N/A 11/27/2015   Procedure: TRANSESOPHAGEAL ECHOCARDIOGRAM (TEE);  Surgeon: [name], MD;  Location: [place];  Service: Open Heart Surgery;  Laterality: N/A;   TRANSCATHETER AORTIC VALVE REPLACEMENT, TRANSFEMORAL N/A 11/27/2015   Procedure: TRANSCATHETER AORTIC VALVE REPLACEMENT, TRANSFEMORAL;  Surgeon: [name], MD;  Location: [place];  Service: Open Heart Surgery;  Laterality: N/A;   Fam HX Family History  Problem Relation Age of Onset   Diabetes Mother    Cancer - Prostate Father    Pulmonary  embolism Father    Hypertension Sister    Diabetes Sister    Hypertension Brother    Arrhythmia Brother        afib   CAD Brother    Pulmonary fibrosis Brother    Colon cancer Neg Hx    Esophageal cancer Neg Hx    Inflammatory bowel disease Neg Hx    Liver disease Neg Hx    Pancreatic cancer Neg Hx    Stomach cancer Neg Hx    Rectal cancer Neg Hx     Social Hx  reports that she has never smoked. She has never used smokeless tobacco. She reports that she does not currently use alcohol. She reports that she does not use drugs.   Allergy Allergies  Allergen Reactions   Amoxicillin Other (See Comments)    tongue felt bruised Did it involve swelling of the face/tongue/throat, SOB, or low BP?  No Did it involve sudden or severe rash/hives, skin peeling, or any reaction on the inside of your mouth or nose? No Did you need to seek medical attention at a hospital or doctor's office? No When did it last happen?     childhood  If all above answers are "NO", may proceed with cephalosporin use.     Celebrex [Celecoxib] Other (See Comments)    Mouth sores   Amoxicillin Other (See Comments)    "Made my tongue felt bruised"   Celecoxib Other (See Comments)    Made the mouth sore   Home Meds Prior to Admission medications   Medication Sig Start Date End Date Taking? Authorizing Provider  apixaban (ELIQUIS) 2.5 MG TABS tablet TAKE 1 TABLET(2.5 MG) BY MOUTH TWICE DAILY Patient taking differently: Take 2.5 mg by mouth 2 (two) times daily. 01/17/21  Yes Turner, Eber Hong, MD  atorvastatin (LIPITOR) 10 MG tablet Take 10 mg by mouth every morning.   Yes [provider]  CALCIUM PO Take 1 tablet by mouth 2 (two) times daily.   Yes [provider]  Cholecalciferol (VITAMIN D3 PO) Take 1 tablet by mouth every morning.   Yes [provider]  Cyanocobalamin (VITAMIN B-12 PO) Take 1 tablet by mouth every morning.   Yes [provider]  furosemide (LASIX) 20 MG  tablet Take 1 tablet (20 mg total) by mouth daily as needed for fluid or edema (as needed for shortness of breath, lower extremity swelling, or weight gain 3 lbs in 48 hrs or 5 lbs in 7 days.). Patient taking differently: Take 20 mg by mouth See admin instructions. Take one tablet (20 mg) by mouth on even numbered days of the month 12/05/20  Yes Arrien, Jimmy Picket, MD  methimazole (TAPAZOLE) 5 MG tablet Take 2.5 mg by mouth in the morning.   Yes [provider]  Multiple Vitamins-Minerals (OCUVITE PRESERVISION PO) Take 1 capsule by mouth in the morning.   Yes [provider]  pantoprazole (PROTONIX) 40 MG tablet TAKE 1 TABLET(40 MG) BY MOUTH DAILY AT 6 AM Patient taking differently: Take 40 mg by mouth every morning. 03/30/19  Yes Mansouraty, Telford Nab., MD  Polyethyl Glycol-Propyl Glycol (SYSTANE) 0.4-0.3 % SOLN Place 1 drop into both eyes daily as needed (dry eyes).   Yes [provider]  acetaminophen (TYLENOL) 500 MG tablet Take 1-2 tablets (500-1,000 mg total) by mouth every 6 (six) hours as needed for mild pain or moderate pain (or headaches). Patient not taking: Reported on 04/13/2021 11/21/20   Domenic Moras, PA-C    Physical Exam: Vitals:   04/13/21 1332 04/13/21 1400 04/13/21 1600 04/13/21 1622  BP:  (!) 167/58 (!) 155/39 (!) 151/46  Pulse:  (!) 53 84 (!) 40  Resp:  19 14 (!) 26  Temp: (!) 97.4 F (36.3 C)     TempSrc: Rectal     SpO2:  99% 91% 94%  Weight:      Height:        GENERAL: Frail looking elderly female.  No apparent distress. HEENT: MMM.  Vision grossly intact.  Hard hearing. NECK: Supple.  Prominent JVD to the jaw. RESP:  No IWOB. Good air movement bilaterally. CVS: Irregular rhythm at 48 bpm. Heart sounds normal.  ABD/GI/GU: Bowel sounds present. Soft. Non tender.  MSK/EXT:  Moves extremities.  RLE edema.  RLE very cold to touch.  SKIN: Clean looking wounds over posterior aspect of LLE. NEURO: Sleepy but wakes to voice.  Oriented  to  self, "[place]" and his son.  Follows commands.  PERRL.  No facial asymmetry.  No gross focal neurodeficit but limited exam due to mental status. PSYCH: Calm. Normal affect.   Personally Reviewed Radiological Exams CT Angio Chest PE W and/or Wo Contrast  Result Date: 04/13/2021 CLINICAL DATA:  PE suspected, high probability EXAM: CT ANGIOGRAPHY CHEST WITH CONTRAST TECHNIQUE: Multidetector CT imaging of the chest was performed using the standard protocol during bolus administration of intravenous contrast. Multiplanar CT image reconstructions and MIPs were obtained to evaluate the vascular anatomy. CONTRAST:  63mL OMNIPAQUE IOHEXOL 350 MG/ML SOLN COMPARISON:  CT scan of the chest 10/24/2015 FINDINGS: Cardiovascular: Adequate opacification of the pulmonary arteries to the proximal segmental level. Opacification more distally is limited by relatively poor cardiac output and in adequate contrast opacification. The main pulmonary artery is enlarged at 3.5 cm. Cardiomegaly with right heart enlargement. Contrast material refluxes into the dilated inferior vena cava and hepatic veins. Surgical changes of prior aortic valve replacement. Caseous calcifications along the posterior mitral valve annulus. Atherosclerotic calcifications along the coronary arteries. Atherosclerotic calcifications along the thoracic aorta. No evidence of aneurysm. Mediastinum/Nodes: Similar appearance of massive thyroid goiter with numerous internal calcifications larger on the left than the right extending into the left retrosternal space. Similar findings visible on prior imaging from 2017. no mediastinal mass or adenopathy. Unremarkable esophagus. Lungs/Pleura: Small bilateral layering pleural effusions. Interlobular septal thickening consistent with interstitial edema. Additionally, there is some mild subpleural reticulation and architectural distortion. Diffuse mild bronchial wall thickening. No pneumothorax. Moderate respiratory  motion artifact. No definitive pulmonary nodule identified although evaluation is limited by the underlying parenchymal findings and motion artifact. Upper Abdomen: No acute abnormality. Musculoskeletal: No chest wall abnormality. No acute or significant osseous findings. Review of the MIP images confirms the above findings. IMPRESSION: 1. Negative for pulmonary embolism. 2. Positive for mild congestive heart failure with evidence of elevated right heart pressures. 3. Mild pulmonary edema. 4. Enlarged main pulmonary artery suggests pulmonary arterial hypertension. 5. Peripheral subpleural reticulation and architectural distortion likely represents early fibrotic changes. 6. Stable large thyroid goiter with massive substernal component essentially unchanged dating back to March of 2017. 7. Caseous calcification of the mitral valve annulus. 8. Surgical changes of prior aortic valve replacement. 9. Aortic and coronary artery atherosclerotic calcifications. Aortic Atherosclerosis (ICD10-I70.0). Electronically Signed   By: Jacqulynn Cadet M.D.   On: 04/13/2021 15:07   DG Chest Port 1 View  Result Date: 04/13/2021 CLINICAL DATA:  87 year old female with shortness of breath EXAM: PORTABLE CHEST 1 VIEW COMPARISON:  12/01/2020 FINDINGS: Cardiomediastinal silhouette unchanged. Reticular opacities of the bilateral lungs with interlobular septal thickening. Fullness in the central vasculature. Blunting of the left costophrenic angle. No pneumothorax. Changes of prior TAVR.  Calcifications of the aortic arch. No displaced fracture. IMPRESSION: Evidence of acute CHF Changes of prior TAVR Electronically Signed   By: Corrie Mckusick D.O.   On: 04/13/2021 13:42   VAS [place] LOWER EXTREMITY VENOUS (DVT)  Result Date: 04/13/2021  Lower Venous DVT Study Patient Name:  CATRINIA RACICOT  Date of Exam:   04/13/2021 Medical Rec #: [id number]         Accession #:    [id number] Date of Birth: 01/06/1921        Patient Gender: F Patient  Age:   23 years Exam Location:  [facility] Procedure:      VAS [place] LOWER EXTREMITY VENOUS (DVT) Referring Phys: DAN FLOYD --------------------------------------------------------------------------------  Indications: Swelling.  Risk Factors: None identified. Comparison Study: No prior studies. Performing Technologist: Oliver Hum RVT  Examination Guidelines: A complete evaluation includes B-mode imaging, spectral Doppler, color Doppler, and power Doppler as needed of all accessible portions of each vessel. Bilateral testing is considered an integral part of a complete examination. Limited examinations for reoccurring indications may be performed as noted. The reflux portion of the exam is performed with the patient in reverse Trendelenburg.  +---------+---------------+---------+-----------+----------+--------------+ RIGHT    CompressibilityPhasicitySpontaneityPropertiesThrombus Aging +---------+---------------+---------+-----------+----------+--------------+ CFV      Full           Yes      Yes                                 +---------+---------------+---------+-----------+----------+--------------+ SFJ      Full                                                        +---------+---------------+---------+-----------+----------+--------------+ FV Prox  Full                                                        +---------+---------------+---------+-----------+----------+--------------+ FV Mid   Full                                                        +---------+---------------+---------+-----------+----------+--------------+ FV DistalFull                                                        +---------+---------------+---------+-----------+----------+--------------+ PFV      Full                                                        +---------+---------------+---------+-----------+----------+--------------+ POP      Full           Yes      Yes                                  +---------+---------------+---------+-----------+----------+--------------+ PTV      Full                                                        +---------+---------------+---------+-----------+----------+--------------+ PERO     Full                                                        +---------+---------------+---------+-----------+----------+--------------+   +----+---------------+---------+-----------+----------+--------------+  LEFTCompressibilityPhasicitySpontaneityPropertiesThrombus Aging +----+---------------+---------+-----------+----------+--------------+ CFV Full           Yes      Yes                                 +----+---------------+---------+-----------+----------+--------------+     Summary: RIGHT: - There is no evidence of deep vein thrombosis in the lower extremity.  - No cystic structure found in the popliteal fossa.  LEFT: - No evidence of common femoral vein obstruction.  *See table(s) above for measurements and observations.    Preliminary      Personally Reviewed Labs: CBC: Recent Labs  Lab 04/13/21 1241 04/13/21 1320  WBC 7.2  --   NEUTROABS 5.7  --   HGB 13.9 13.3  HCT 42.9 39.0  MCV 96.4  --   PLT 141*  --    Basic Metabolic Panel: Recent Labs  Lab 04/13/21 1241 04/13/21 1320  NA 140 141  K 4.4 4.2  CL 104 105  CO2 28  --   GLUCOSE 136* 130*  BUN 36* 33*  CREATININE 1.58* 1.60*  CALCIUM 9.2  --    GFR: Estimated Creatinine Clearance: 16.6 mL/min (A) (by C-G formula based on SCr of 1.6 mg/dL (H)). Liver Function Tests: Recent Labs  Lab 04/13/21 1241  AST 17  ALT 12  ALKPHOS 79  BILITOT 1.5*  PROT 6.8  ALBUMIN 3.6   No results for input(s): LIPASE, AMYLASE in the last 168 hours. No results for input(s): AMMONIA in the last 168 hours. Coagulation Profile: Recent Labs  Lab 04/13/21 1257  INR 1.6*   Cardiac Enzymes: Recent Labs  Lab 04/13/21 1257  CKTOTAL 31*   BNP (last 3  results) No results for input(s): PROBNP in the last 8760 hours. HbA1C: No results for input(s): HGBA1C in the last 72 hours. CBG: No results for input(s): GLUCAP in the last 168 hours. Lipid Profile: No results for input(s): CHOL, HDL, LDLCALC, TRIG, CHOLHDL, LDLDIRECT in the last 72 hours. Thyroid Function Tests: Recent Labs    04/13/21 1505  TSH 2.472   Anemia Panel: No results for input(s): VITAMINB12, FOLATE, FERRITIN, TIBC, IRON, RETICCTPCT in the last 72 hours. Urine analysis:    Component Value Date/Time   COLORURINE YELLOW (A) 04/13/2021 1330   APPEARANCEUR CLEAR (A) 04/13/2021 1330   LABSPEC 1.025 04/13/2021 1330   PHURINE 6.0 04/13/2021 1330   GLUCOSEU NEGATIVE 04/13/2021 1330   HGBUR NEGATIVE 04/13/2021 1330   BILIRUBINUR NEGATIVE 04/13/2021 1330   KETONESUR TRACE (A) 04/13/2021 1330   PROTEINUR 100 (A) 04/13/2021 1330   NITRITE NEGATIVE 04/13/2021 1330   LEUKOCYTESUR NEGATIVE 04/13/2021 1330    Sepsis Labs:  Lactic acid within normal.  Personally Reviewed EKG:  See HPI  Assessment/Plan Persistent A. fib with bradycardia/history of first degree AVB-not on nodal blocking agents or Aricept.  Per son, off Eliquis due to falls.  TSH and electrolytes within normal.  She seemed to have some chronotropic response with exertion.  -Telemetry monitoring -Per patient's son, okay to try ACLS measures such as atropine but no pacing, DCCV, CPR or intubation.  Acute on chronic diastolic CHF-presents with shortness of breath and lower extremity edema.  Has prominent JVD with bibasilar crackles on exam.  CXR and CT chest concerning for vascular congestion.  CT also raises concern about right heart failure.  Last TTE in 08/2019 with LVEF of 55 to 60%, moderate LVH, indeterminate DD, moderate LAE.  Patient was given 1 L IV LR bolus followed by IV Lasix 20 mg in ED. -Would continue IV Lasix 40 mg daily starting tomorrow based on her response -Monitor fluid status, renal functions  and electrolytes -Update echocardiogram  History of aortic stenosis s/p TAVR -Echocardiogram as above  Chronic LBBB-stable  Diet controlled DM-2 -Monitor blood glucose with daily labs  Hard of hearing-does not wear hearing aid.  CKD-3B/azotemia: Stable -Monitor  Right lower extremity edema -Preliminary venous Doppler negative for DVT  Acute metabolic encephalopathy-Sleepy but wakes to voice.  Oriented to self, city and her son.  Definitely of baseline per patient's son.  No focal neuro symptoms.  Low suspicion for infectious process. Dementia without behavioral disturbance -Basic encephalopathy labs -Fall, aspiration and delirium precautions  Hyperthyroidism-on methimazole.  TSH within normal. -Follow-up free T4 -May resume methimazole in the morning  Physical deconditioning-ambulates using rolling walker at baseline. -PT/OT eval  Goal of care counseling-patient with advanced age and comorbidities as above.  Recently enrolled with home hospice.  She is appropriately DNR and DNI.  Per patient's son, [name], NO artificial feeding, CPR, DCCV or intubation but brief trial of ACLS meds, IV fluids and IV antibiotics.  -Palliative care consulted for ongoing goal of care discussion   DVT prophylaxis: Subcu Lovenox  Code Status: Partial code as above Family Communication: Updated patient's son at bedside  Disposition Plan: Admit to telemetry Consults called: None Admission status: Observation Level of care: Telemetry   Mercy Riding MD [facility]  If 7PM-7AM, please contact night-coverage [email or web address]  04/13/2021, 5:25 PM

## 2021-04-13 NOTE — Plan of Care (Signed)
?  Problem: Education: ?Goal: Ability to demonstrate management of disease process will improve ?Outcome: Progressing ?  ?Problem: Activity: ?Goal: Capacity to carry out activities will improve ?Outcome: Progressing ?  ?Problem: Cardiac: ?Goal: Ability to achieve and maintain adequate cardiopulmonary perfusion will improve ?Outcome: Progressing ?  ?

## 2021-04-13 NOTE — ED Notes (Signed)
Pt resting in stretcher, eyes closed. Respirations equal, tachy. Son at bedside.

## 2021-04-13 NOTE — ED Triage Notes (Signed)
Pt presents to ED via ems cc sob. Per ems, pt is a resident at [facility] and was last seen a week ago per staff. Per ems, staff and family states pt was having sob while talking today. Pt appears to be sob when speaking and speaks at a low volume. Pt states having difficulty speaking. Pt has hx of dementia, is alert/oriented to person, is at baseline per ems.

## 2021-04-13 NOTE — ED Notes (Signed)
RN made aware of pt's low heart rate

## 2021-04-13 NOTE — ED Notes (Signed)
Provider made aware critical pO2 27.4.

## 2021-04-13 NOTE — Progress Notes (Signed)
Bilateral lower extremity venous duplex has been completed. Preliminary results can be found in CV Proc through chart review.  Results were given to Dr. Tyrone Nine.  04/13/21 3:18 PM Alexa Mcdonald RVT

## 2021-04-13 NOTE — ED Provider Notes (Signed)
  Physical Exam  BP (!) 155/39   Pulse 84   Temp (!) 97.4 F (36.3 C) (Rectal)   Resp 14   Ht 5\' 3"  (1.6 m)   Wt 52.2 kg   SpO2 91%   BMI 20.37 kg/m   Physical Exam  ED Course/Procedures     Procedures  MDM  Patient care assumed at 3 PM.  Patient is here with some shortness of breath.  Signout pending CT PE study.  Patient's COVID test was negative.  4:15 PM CT showed a goiter that is chronic.  Patient does have some mild CHF.  Patient's DVT study is negative and BNP is normal.  Patient was given 20 mg of Lasix.  When I reassessed patient, patient's heart rate is dropped to low 30s and patient is in A. fib.  Patient is not on any beta-blockers or calcium channel blockers.  Patient does not appear to be in heart block.  I wonder if this is side effect of methimazole.  Patient is DNR and has verified that with family.  At this point, I think patient just needs to be admitted for observation for bradycardia and likely can get an echo inpatient        Drenda Freeze, MD 04/13/21 [phone]

## 2021-04-13 NOTE — ED Notes (Signed)
PureWick placed on patient. 

## 2021-04-13 NOTE — ED Provider Notes (Signed)
[facility] DEPT Provider Note   CSN: [id number] Arrival date & time: 04/13/21  1221     History Chief Complaint  Patient presents with   Shortness of Breath    Alexa Mcdonald is a 87 y.o. female.  87 yo F with a cc of sob.  Patient felt to be talking more quiet than normal and breathing faster.   The history is provided by the patient.  Shortness of Breath Severity:  Moderate Onset quality:  Gradual Duration:  2 days Timing:  Constant Progression:  Worsening Chronicity:  New Relieved by:  Nothing Worsened by:  Nothing Ineffective treatments:  None tried Associated symptoms: no chest pain, no fever, no headaches, no vomiting and no wheezing       Past Medical History:  Diagnosis Date   1st degree AV block    Bradycardia    Chronic diastolic heart failure ([place]) 02/07/2016   CKD (chronic kidney disease), stage III (HCC)    Colon, diverticulosis    Diabetes mellitus    diet controlled   Essential hypertension    GERD (gastroesophageal reflux disease)    Hematochezia    HOH (hard of hearing)    wears bilateral hearing aids   Hx of orthostatic hypotension    Interstitial lung disease (HCC)    LBBB (left bundle branch block)    Long term current use of anticoagulant therapy    Mediastinal goiter     recurrent s/p resection 1988   Mild CAD    Mitral regurgitation    Osteopenia    Osteoporosis    Paroxysmal atrial fibrillation ([place])    a. CHA2DS2VASc = 5-->coumadin;  b. previously on amio->d/c'd 11/2014 secondary to concern for possible amio lung.   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   23 mm Edwards Sapien 3 transcatheter heart valve placed via percutaneous right transfemoral approach   Severe aortic stenosis    s/p TAVR with stable prosthesis by echo 08/2019 - mean AVG 9mmHg   Stroke ([place])    Tachycardia-bradycardia ([place])    Vitamin D deficiency     Patient Active Problem List   Diagnosis  Date Noted   Acute CVA (cerebrovascular accident) ([place]) 12/02/2020   Dysphagia 12/01/2020   Rhabdomyolysis 06/17/2020   Lactic acidosis 06/17/2020   Diabetes mellitus ([place])    Iron deficiency anemia 12/05/2018   Duodenal ulcer 12/05/2018   Gastric erosion 12/05/2018   History of cholecystectomy 12/05/2018   History of ERCP 12/05/2018   Iron deficiency anemia due to chronic blood loss    Erosive gastritis    Acute lower GI bleeding 11/09/2018   Acute blood loss anemia 11/09/2018   Symptomatic anemia 11/09/2018   Choledocholithiasis with obstruction 10/23/2018   Occipital stroke ([place]) 06/11/2016   Paroxysmal atrial fibrillation ([place]) 06/11/2016   Homonymous hemianopsia due to old embolic stroke 46/96/2952   Loss of peripheral visual field 03/12/2016   Ataxia 03/12/2016   Dizziness and giddiness 03/12/2016   Cerebrovascular accident (CVA) due to embolism of posterior cerebral artery with infarctions of both occipital lobes ([place]) 03/12/2016   Acute on chronic diastolic CHF (congestive heart failure) ([place]) 02/07/2016   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   Severe aortic stenosis    Atrial fibrillation, persistent ([place])    Mitral regurgitation    Interstitial lung disease ([place]) 10/30/2014   Hematochezia 12/13/2013   CKD (chronic kidney disease), stage III ([place]) 12/13/2013   Hyperthyroidism 12/13/2013  GERD (gastroesophageal reflux disease) 12/13/2013   Chronic anticoagulation 12/13/2013   Elevated brain natriuretic peptide (BNP) level 12/13/2013   Sinus bradycardia 12/13/2013   Pneumonia 12/13/2013   Encounter for therapeutic drug monitoring 08/29/2013   Essential hypertension 08/05/2013   Edema of both ankles 08/05/2013   Bradycardia 11/27/2011    Past Surgical History:  Procedure Laterality Date   BILIARY DILATION  10/24/2018   Procedure: BILIARY DILATION;  Surgeon: Jackquline Denmark, MD;  Location: [facility] ENDOS[facility];  Service: Endoscopy;;   BIOPSY   11/11/2018   Procedure: BIOPSY;  Surgeon: Irving Copas., MD;  Location: [facility] ENDOSCOPY;  Service: Gastroenterology;;   CARDIAC CATHETERIZATION N/A 10/31/2015   Procedure: Right/Left Heart Cath and Coronary Angiography;  Surgeon: [name], MD;  Location: [facility];  Service: Cardiovascular;  Laterality: N/A;   CARPAL TUNNEL RELEASE Bilateral    CATARACT EXTRACTION Bilateral    CESAREAN SECTION     x3   CHOLECYSTECTOMY N/A 10/25/2018   Procedure: LAPAROSCOPIC CHOLECYSTECTOMY WITH INTRAOPERATIVE CHOLANGIOGRAM;  Surgeon: Kieth Brightly Arta Bruce, MD;  Location: [facility] ORS;  Service: General;  Laterality: N/A;   ERCP N/A 10/24/2018   Procedure: ENDOSCOPIC RETROGRADE CHOLANGIOPANCREATOGRAPHY (ERCP);  Surgeon: Jackquline Denmark, MD;  Location: [facility];  Service: Endoscopy;  Laterality: N/A;   ESOPHAGOGASTRODUODENOSCOPY (EGD) WITH PROPOFOL N/A 11/11/2018   Procedure: ESOPHAGOGASTRODUODENOSCOPY (EGD) WITH PROPOFOL;  Surgeon: Rush Landmark Telford Nab., MD;  Location: [facility];  Service: Gastroenterology;  Laterality: N/A;   mediastinal removal of a goiter     MRI  08/18/07   head (diabetes heart study at [facility])   [place]  10/24/2018   Procedure: REMOVAL OF STONES;  Surgeon: Jackquline Denmark, MD;  Location: [facility];  Service: Endoscopy;;   SPHINCTEROTOMY  10/24/2018   Procedure: Joan Mayans;  Surgeon: Jackquline Denmark, MD;  Location[facility];  Service: Endoscopy;;   TEE WITHOUT CARDIOVERSION N/A 11/27/2015   Procedure: TRANSESOPHAGEAL ECHOCARDIOGRAM (TEE);  Surgeon: [name], MD;  Location: [facility];  Service: Open Heart Surgery;  Laterality: N/A;   TRANSCATHETER AORTIC VALVE REPLACEMENT, TRANSFEMORAL N/A 11/27/2015   Procedure: TRANSCATHETER AORTIC VALVE REPLACEMENT, TRANSFEMORAL;  Surgeon: [name], MD;  Location: [facility];  Service: Open Heart Surgery;  Laterality: N/A;     OB History   No obstetric history on file.     Family History  Problem Relation Age of  Onset   Diabetes Mother    Cancer - Prostate Father    Pulmonary embolism Father    Hypertension Sister    Diabetes Sister    Hypertension Brother    Arrhythmia Brother        afib   CAD Brother    Pulmonary fibrosis Brother    Colon cancer Neg Hx    Esophageal cancer Neg Hx    Inflammatory bowel disease Neg Hx    Liver disease Neg Hx    Pancreatic cancer Neg Hx    Stomach cancer Neg Hx    Rectal cancer Neg Hx     Social History   Tobacco Use   Smoking status: Never   Smokeless tobacco: Never  Vaping Use   Vaping Use: Never used  Substance Use Topics   Alcohol use: Not Currently   Drug use: Never    Home Medications Prior to Admission medications   Medication Sig Start Date End Date Taking? Authorizing Provider  acetaminophen (TYLENOL) 500 MG tablet Take 1-2 tablets (500-1,000 mg total) by mouth every 6 (six) hours as needed for mild pain or  moderate pain (or headaches). 11/21/20   Domenic Moras, PA-C  apixaban (ELIQUIS) 2.5 MG TABS tablet TAKE 1 TABLET(2.5 MG) BY MOUTH TWICE DAILY 01/17/21   Sueanne Margarita, MD  atorvastatin (LIPITOR) 10 MG tablet Take 10 mg by mouth daily.    [provider]  Cyanocobalamin (VITAMIN B-12 PO) Take 1 tablet by mouth daily.    [provider]  furosemide (LASIX) 20 MG tablet Take 1 tablet (20 mg total) by mouth daily as needed for fluid or edema (as needed for shortness of breath, lower extremity swelling, or weight gain 3 lbs in 48 hrs or 5 lbs in 7 days.). 12/05/20   Arrien, Jimmy Picket, MD  methimazole (TAPAZOLE) 5 MG tablet Take 2.5 mg by mouth in the morning.    [provider]  Multiple Vitamins-Minerals (OCUVITE PRESERVISION PO) Take 1 capsule by mouth in the morning.    [provider]  pantoprazole (PROTONIX) 40 MG tablet TAKE 1 TABLET(40 MG) BY MOUTH DAILY AT 6 AM Patient taking differently: Take 40 mg by mouth daily with breakfast. 03/30/19   Mansouraty, Telford Nab., MD    Allergies     Amoxicillin, Celebrex [celecoxib], Amoxicillin, and Celecoxib  Review of Systems   Review of Systems  Constitutional:  Negative for chills and fever.  HENT:  Negative for congestion and rhinorrhea.   Eyes:  Negative for redness and visual disturbance.  Respiratory:  Negative for shortness of breath and wheezing.   Cardiovascular:  Negative for chest pain and palpitations.  Gastrointestinal:  Negative for nausea and vomiting.  Genitourinary:  Negative for dysuria and urgency.  Musculoskeletal:  Negative for arthralgias and myalgias.  Skin:  Negative for pallor and wound.  Neurological:  Negative for dizziness and headaches.   Physical Exam Updated Vital Signs BP (!) 167/58   Pulse (!) 53   Temp (!) 97.4 F (36.3 C) (Rectal)   Resp 19   Ht 5\' 3"  (1.6 m)   Wt 52.2 kg   SpO2 99%   BMI 20.37 kg/m   Physical Exam Vitals and nursing note reviewed.  Constitutional:      General: She is not in acute distress.    Appearance: She is well-developed. She is not diaphoretic.  HENT:     Head: Normocephalic and atraumatic.  Eyes:     Pupils: Pupils are equal, round, and reactive to light.  Neck:     Vascular: JVD (to the jaw) present.  Cardiovascular:     Rate and Rhythm: Bradycardia present. Rhythm irregular.     Heart sounds: No murmur heard.   No friction rub. No gallop.  Pulmonary:     Effort: Pulmonary effort is normal. Tachypnea present.     Breath sounds: No decreased breath sounds, wheezing or rales.  Abdominal:     General: There is no distension.     Palpations: Abdomen is soft.     Tenderness: There is no abdominal tenderness.  Musculoskeletal:        General: No tenderness.     Cervical back: Normal range of motion and neck supple.     Comments: RLE cool to touch no palpable pulse up to the femoral.  Dopplerable pulse DP.    Skin:    General: Skin is warm and dry.  Neurological:     Mental Status: She is alert and oriented to person, place, and time.   Psychiatric:        Behavior: Behavior normal.    ED Results /  Procedures / Treatments   Labs (all labs ordered are listed, but only abnormal results are displayed) Labs Reviewed  CBC WITH DIFFERENTIAL/PLATELET - Abnormal; Notable for the following components:      Result Value   Platelets 141 (*)    All other components within normal limits  COMPREHENSIVE METABOLIC PANEL - Abnormal; Notable for the following components:   Glucose, Bld 136 (*)    BUN 36 (*)    Creatinine, Ser 1.58 (*)    Total Bilirubin 1.5 (*)    GFR, Estimated 30 (*)    All other components within normal limits  URINALYSIS, ROUTINE W REFLEX MICROSCOPIC - Abnormal; Notable for the following components:   Color, Urine YELLOW (*)    APPearance CLEAR (*)    Ketones, ur TRACE (*)    Protein, ur 100 (*)    Bacteria, UA RARE (*)    All other components within normal limits  PROTIME-INR - Abnormal; Notable for the following components:   Prothrombin Time 19.1 (*)    INR 1.6 (*)    All other components within normal limits  CK - Abnormal; Notable for the following components:   Total CK 31 (*)    All other components within normal limits  I-STAT CHEM 8, ED - Abnormal; Notable for the following components:   BUN 33 (*)    Creatinine, Ser 1.60 (*)    Glucose, Bld 130 (*)    All other components within normal limits  RESP PANEL BY RT-PCR (FLU A&B, COVID) [id number]  URINE CULTURE  CULTURE, BLOOD (ROUTINE X 2)  CULTURE, BLOOD (ROUTINE X 2)  LACTIC ACID, PLASMA  APTT  T4, FREE  TSH  BLOOD GAS, VENOUS    EKG EKG Interpretation  Date/Time:  Saturday April 13 2021 12:43:57 EDT Ventricular Rate:  56 PR Interval:    QRS Duration: 159 QT Interval:  541 QTC Calculation: 523 R Axis:   -28 Text Interpretation: Atrial fibrillation Left bundle branch block No significant change since last tracing Confirmed by Deno Etienne [id number]) on 04/13/2021 1:04:02 PM  Radiology DG Chest Port 1 View  Result Date:  04/13/2021 CLINICAL DATA:  87 year old female with shortness of breath EXAM: PORTABLE CHEST 1 VIEW COMPARISON:  12/01/2020 FINDINGS: Cardiomediastinal silhouette unchanged. Reticular opacities of the bilateral lungs with interlobular septal thickening. Fullness in the central vasculature. Blunting of the left costophrenic angle. No pneumothorax. Changes of prior TAVR.  Calcifications of the aortic arch. No displaced fracture. IMPRESSION: Evidence of acute CHF Changes of prior TAVR Electronically Signed   By: Corrie Mckusick D.O.   On: 04/13/2021 13:42    Procedures Procedures   Medications Ordered in ED Medications  albuterol (VENTOLIN HFA) 108 (90 Base) MCG/ACT inhaler 2 puff (has no administration in time range)  lactated ringers bolus 1,000 mL (0 mLs Intravenous Stopped 04/13/21 1426)  iohexol (OMNIPAQUE) 350 MG/ML injection 60 mL (60 mLs Intravenous Contrast Given 04/13/21 1422)    ED Course  I have reviewed the triage vital signs and the nursing notes.  Pertinent labs & imaging results that were available during my care of the patient were reviewed by me and considered in my medical decision making (see chart for details).    MDM Rules/Calculators/A&P                           87 yo F with a cc of sob. Patient denies sob.  Patient is mildly tachypneic.  There is no  obvious cause for her symptoms on exam.  Will obtain a chest x-ray UA blood work.  Her right lower extremity is very cold to the touch though she has dopplerable pulses.  Will order DVT study.  Chest x-ray read as fluid overload.  On my review of the x-ray it seems more unilateral although the patient and family deny any coughing or fever.  We will obtain a CT scan to better evaluate.  Patient care was signed out to Dr. Darl Householder, please see his note for further details care in the ED.  The patients results and plan were reviewed and discussed.   Any x-rays performed were independently reviewed by myself.   Differential diagnosis  were considered with the presenting HPI.  Medications  albuterol (VENTOLIN HFA) 108 (90 Base) MCG/ACT inhaler 2 puff (has no administration in time range)  lactated ringers bolus 1,000 mL (0 mLs Intravenous Stopped 04/13/21 1426)  iohexol (OMNIPAQUE) 350 MG/ML injection 60 mL (60 mLs Intravenous Contrast Given 04/13/21 1422)    Vitals:   04/13/21 1236 04/13/21 1315 04/13/21 1332 04/13/21 1400  BP:  (!) 169/55  (!) 167/58  Pulse:  (!) 53  (!) 53  Resp:  20  19  Temp:   (!) 97.4 F (36.3 C)   TempSrc:   Rectal   SpO2:  100%  99%  Weight: 52.2 kg     Height: 5\' 3"  (1.6 m)       Final diagnoses:  SOB (shortness of breath)     Final Clinical Impression(s) / ED Diagnoses Final diagnoses:  SOB (shortness of breath)    Rx / DC Orders ED Discharge Orders     None        Deno Etienne, DO 04/13/21 1507

## 2021-04-14 ENCOUNTER — Encounter (HOSPITAL_COMMUNITY): Payer: Self-pay | Admitting: Student

## 2021-04-14 ENCOUNTER — Observation Stay (HOSPITAL_COMMUNITY): Payer: Medicare Other

## 2021-04-14 DIAGNOSIS — I4819 Other persistent atrial fibrillation: Secondary | ICD-10-CM | POA: Diagnosis present

## 2021-04-14 DIAGNOSIS — I130 Hypertensive heart and chronic kidney disease with heart failure and stage 1 through stage 4 chronic kidney disease, or unspecified chronic kidney disease: Secondary | ICD-10-CM | POA: Diagnosis present

## 2021-04-14 DIAGNOSIS — R0602 Shortness of breath: Secondary | ICD-10-CM | POA: Diagnosis not present

## 2021-04-14 DIAGNOSIS — R531 Weakness: Secondary | ICD-10-CM | POA: Diagnosis not present

## 2021-04-14 DIAGNOSIS — Z20822 Contact with and (suspected) exposure to covid-19: Secondary | ICD-10-CM | POA: Diagnosis present

## 2021-04-14 DIAGNOSIS — D696 Thrombocytopenia, unspecified: Secondary | ICD-10-CM | POA: Diagnosis present

## 2021-04-14 DIAGNOSIS — Z515 Encounter for palliative care: Secondary | ICD-10-CM | POA: Diagnosis not present

## 2021-04-14 DIAGNOSIS — F0390 Unspecified dementia without behavioral disturbance: Secondary | ICD-10-CM

## 2021-04-14 DIAGNOSIS — Z7401 Bed confinement status: Secondary | ICD-10-CM | POA: Diagnosis not present

## 2021-04-14 DIAGNOSIS — L89621 Pressure ulcer of left heel, stage 1: Secondary | ICD-10-CM | POA: Diagnosis present

## 2021-04-14 DIAGNOSIS — G9341 Metabolic encephalopathy: Secondary | ICD-10-CM | POA: Diagnosis present

## 2021-04-14 DIAGNOSIS — K219 Gastro-esophageal reflux disease without esophagitis: Secondary | ICD-10-CM | POA: Diagnosis present

## 2021-04-14 DIAGNOSIS — R001 Bradycardia, unspecified: Secondary | ICD-10-CM | POA: Diagnosis present

## 2021-04-14 DIAGNOSIS — I5031 Acute diastolic (congestive) heart failure: Secondary | ICD-10-CM

## 2021-04-14 DIAGNOSIS — J849 Interstitial pulmonary disease, unspecified: Secondary | ICD-10-CM | POA: Diagnosis present

## 2021-04-14 DIAGNOSIS — L89152 Pressure ulcer of sacral region, stage 2: Secondary | ICD-10-CM | POA: Diagnosis present

## 2021-04-14 DIAGNOSIS — R627 Adult failure to thrive: Secondary | ICD-10-CM | POA: Diagnosis present

## 2021-04-14 DIAGNOSIS — E0500 Thyrotoxicosis with diffuse goiter without thyrotoxic crisis or storm: Secondary | ICD-10-CM | POA: Diagnosis present

## 2021-04-14 DIAGNOSIS — I5033 Acute on chronic diastolic (congestive) heart failure: Secondary | ICD-10-CM | POA: Diagnosis present

## 2021-04-14 DIAGNOSIS — L89611 Pressure ulcer of right heel, stage 1: Secondary | ICD-10-CM | POA: Diagnosis present

## 2021-04-14 DIAGNOSIS — Z66 Do not resuscitate: Secondary | ICD-10-CM

## 2021-04-14 DIAGNOSIS — Z7189 Other specified counseling: Secondary | ICD-10-CM | POA: Diagnosis not present

## 2021-04-14 DIAGNOSIS — Z8673 Personal history of transient ischemic attack (TIA), and cerebral infarction without residual deficits: Secondary | ICD-10-CM | POA: Diagnosis not present

## 2021-04-14 DIAGNOSIS — Z953 Presence of xenogenic heart valve: Secondary | ICD-10-CM | POA: Diagnosis not present

## 2021-04-14 DIAGNOSIS — R1314 Dysphagia, pharyngoesophageal phase: Secondary | ICD-10-CM | POA: Diagnosis present

## 2021-04-14 DIAGNOSIS — I088 Other rheumatic multiple valve diseases: Secondary | ICD-10-CM | POA: Diagnosis present

## 2021-04-14 DIAGNOSIS — E1122 Type 2 diabetes mellitus with diabetic chronic kidney disease: Secondary | ICD-10-CM | POA: Diagnosis present

## 2021-04-14 DIAGNOSIS — N1832 Chronic kidney disease, stage 3b: Secondary | ICD-10-CM | POA: Diagnosis present

## 2021-04-14 DIAGNOSIS — I447 Left bundle-branch block, unspecified: Secondary | ICD-10-CM | POA: Diagnosis present

## 2021-04-14 LAB — BASIC METABOLIC PANEL
Anion gap: 10 (ref 5–15)
BUN: 34 mg/dL — ABNORMAL HIGH (ref 8–23)
CO2: 27 mmol/L (ref 22–32)
Calcium: 8.9 mg/dL (ref 8.9–10.3)
Chloride: 104 mmol/L (ref 98–111)
Creatinine, Ser: 1.51 mg/dL — ABNORMAL HIGH (ref 0.44–1.00)
GFR, Estimated: 31 mL/min — ABNORMAL LOW (ref >60)
Glucose, Bld: 87 mg/dL (ref 70–99)
Potassium: 3.8 mmol/L (ref 3.5–5.1)
Sodium: 141 mmol/L (ref 135–145)

## 2021-04-14 LAB — ECHOCARDIOGRAM COMPLETE
AR max vel: 0.72 cm2
AV Area VTI: 0.74 cm2
AV Area mean vel: 0.73 cm2
AV Mean grad: 17.6 mmHg
AV Peak grad: 33.8 mmHg
Ao pk vel: 2.91 m/s
Area-P 1/2: 5.23 cm2
Height: 63 in
MV M vel: 4.4 m/s
MV Peak grad: 77.3 mmHg
P 1/2 time: 591 msec
Radius: 0.55 cm
S' Lateral: 3 cm
Weight: 1869.5 oz

## 2021-04-14 LAB — APTT: aPTT: 33 seconds (ref 24–36)

## 2021-04-14 LAB — CBC
HCT: 38.3 % (ref 36.0–46.0)
Hemoglobin: 12.6 g/dL (ref 12.0–15.0)
MCH: 31.2 pg (ref 26.0–34.0)
MCHC: 32.9 g/dL (ref 30.0–36.0)
MCV: 94.8 fL (ref 80.0–100.0)
Platelets: 131 10*3/uL — ABNORMAL LOW (ref 150–400)
RBC: 4.04 MIL/uL (ref 3.87–5.11)
RDW: 15.3 % (ref 11.5–15.5)
WBC: 7.8 10*3/uL (ref 4.0–10.5)
nRBC: 0 % (ref 0.0–0.2)

## 2021-04-14 LAB — URINE CULTURE: Culture: NO GROWTH

## 2021-04-14 LAB — PROTIME-INR
INR: 1.6 — ABNORMAL HIGH (ref 0.8–1.2)
Prothrombin Time: 18.8 seconds — ABNORMAL HIGH (ref 11.4–15.2)

## 2021-04-14 MED ORDER — ALBUTEROL SULFATE (2.5 MG/3ML) 0.083% IN NEBU
2.5000 mg | INHALATION_SOLUTION | Freq: Two times a day (BID) | RESPIRATORY_TRACT | Status: DC
  Administered 2021-04-15 – 2021-04-16 (×2): 2.5 mg via RESPIRATORY_TRACT
  Filled 2021-04-14 (×3): qty 3

## 2021-04-14 MED ORDER — ALBUTEROL SULFATE (2.5 MG/3ML) 0.083% IN NEBU
2.5000 mg | INHALATION_SOLUTION | Freq: Three times a day (TID) | RESPIRATORY_TRACT | Status: DC
  Filled 2021-04-14: qty 3

## 2021-04-14 NOTE — Evaluation (Signed)
Physical Therapy Evaluation Patient Details Name: Alexa Mcdonald MRN: [id number] DOB: 10/20/1920 Today's Date: 04/14/2021   History of Present Illness  Alexa Mcdonald is a 87 y.o. female with history of dementia, diminished hearing, persistent A. fib off Eliquis due to falls, first degree AVB, diastolic CHF, HOZ-2Y, LBBB, diet-controlled DM-2, hypothyroidism, AS s/p TAVR and CVA brought to ED with shortness of breath on 04/13/21.  Dx with acute on chronic diastolic CHF, CXR and CT of chest concerning for vascular congestion, dopplers neg for DVT (bil LE edema), acute metabolic encepholopathy.  Pt active with home Hospice.  Clinical Impression  Short assessment as pt was attempting to get up out of the recliner chair and PT assisted RN tech in getting pt back to bed after being up with therapy (OT) earlier.  Pt fatigued and mumbling.  Moved overall with min assist. Likely slightly more deconditioned than usual, but has hired caregivers at [facility].  She should be able to return to prior arrangement at discharge, but PT will follow acutely to encouraged continued mobility.   PT to follow acutely for deficits listed below.       Follow Up Recommendations No PT follow up    Equipment Recommendations  None recommended by PT    Recommendations for Other Services       Precautions / Restrictions Precautions Precautions: Fall Precaution Comments: incontinent - wears depends Restrictions Weight Bearing Restrictions: No      Mobility  Bed Mobility Overal bed mobility: Needs Assistance Bed Mobility: Sit to Supine;Rolling Rolling: Min assist   Supine to sit: Min assist Sit to supine: Mod assist   General bed mobility comments: Mod assist to lift both legs back into the bed from sitting.  min assist to roll bil for pericare and gown change.    Transfers Overall transfer level: Needs assistance Equipment used: 2 person hand held assist Transfers: Sit to/from [employer] Sit to Stand: Min assist;+2 physical assistance Stand pivot transfers: Min assist;+2 physical assistance       General transfer comment: Two person min assist to help pt stand from recliner and return to bed.  RN tech assisting as pt was trying to get up out of the chair.  I explained that her caregivers at ALF had her up frequently throughout the day and my guess was she was used to moving.  Ambulation/Gait             General Gait Details: NT this session as pt was fatigued from being up, returned to bed to rest.  Stairs            Wheelchair Mobility    Modified Rankin (Stroke Patients Only)       Balance Overall balance assessment: Needs assistance Sitting-balance support: Feet supported;Bilateral upper extremity supported Sitting balance-Leahy Scale: Fair Sitting balance - Comments: close supervision EOB.   Standing balance support: Bilateral upper extremity supported Standing balance-Leahy Scale: Poor Standing balance comment: needs external support in standing.                             Pertinent Vitals/Pain Pain Assessment: Faces Faces Pain Scale: No hurt    Home Living Family/patient expects to be discharged to:: Assisted living               Home Equipment: Walker - 2 wheels Additional Comments: Per chart review, pt is from [facility].    Prior Function Level  of Independence: Needs assistance   Gait / Transfers Assistance Needed: uses walker and has min guard for ambulation  ADL's / Homemaking Assistance Needed: needs assistance for all ADL tasks  Comments: Patient has a caregiver from 8-5 and then hourly checks at night.     Hand Dominance   Dominant Hand: Right    Extremity/Trunk Assessment   Upper Extremity Assessment Upper Extremity Assessment: Defer to OT evaluation    Lower Extremity Assessment Lower Extremity Assessment: Generalized weakness;LLE deficits/detail;RLE deficits/detail RLE Deficits  / Details: bil LE edema, red, irritated looking skin, generalized weakness when up on her feet requrining assistance, likely a bit weaker than baseline. LLE Deficits / Details: bil LE edema, red, irritated looking skin, generalized weakness when up on her feet requrining assistance, likely a bit weaker than baseline.    Cervical / Trunk Assessment Cervical / Trunk Assessment: Kyphotic  Communication   Communication: HOH  Cognition Arousal/Alertness: Awake/alert Behavior During Therapy: Flat affect Overall Cognitive Status: History of cognitive impairments - at baseline                                 General Comments: Dementia; able to follow commands.      General Comments General comments (skin integrity, edema, etc.): HR 48 bpm on wireless monitor.    Exercises     Assessment/Plan    PT Assessment Patient needs continued PT services  PT Problem List Decreased strength;Decreased activity tolerance;Decreased balance;Decreased mobility;Decreased cognition;Decreased knowledge of use of DME;Decreased safety awareness;Cardiopulmonary status limiting activity;Decreased skin integrity       PT Treatment Interventions DME instruction;Gait training;Functional mobility training;Therapeutic activities;Therapeutic exercise;Balance training;Cognitive remediation;Patient/family education    PT Goals (Current goals can be found in the Care Plan section)  Acute Rehab PT Goals Patient Stated Goal: pt unable to state PT Goal Formulation: Patient unable to participate in goal setting Time For Goal Achievement: 04/28/21 Potential to Achieve Goals: Good    Frequency Min 3X/week   Barriers to discharge        Co-evaluation               AM-PAC PT "6 Clicks" Mobility  Outcome Measure Help needed turning from your back to your side while in a flat bed without using bedrails?: A Little Help needed moving from lying on your back to sitting on the side of a flat bed  without using bedrails?: A Little Help needed moving to and from a bed to a chair (including a wheelchair)?: A Little Help needed standing up from a chair using your arms (e.g., wheelchair or bedside chair)?: A Little Help needed to walk in hospital room?: A Little Help needed climbing 3-5 steps with a railing? : Total 6 Click Score: 16    End of Session   Activity Tolerance: Patient limited by fatigue Patient left: in bed;with call bell/phone within reach;with nursing/sitter in room (RN tech in room finishing getting her settled.)   PT Visit Diagnosis: Muscle weakness (generalized) (M62.81);Difficulty in walking, not elsewhere classified (R26.2)    Time: 4132-4401 PT Time Calculation (min) (ACUTE ONLY): 9 min   Charges:   PT Evaluation $PT Eval Moderate Complexity: 1 Mod          Verdene Lennert, PT, DPT  Acute Rehabilitation Ortho Tech Supervisor [phone] pager [phone]) [phone] office

## 2021-04-14 NOTE — Plan of Care (Signed)
  Problem: Cardiac: °Goal: Ability to achieve and maintain adequate cardiopulmonary perfusion will improve °Outcome: Not Progressing °  °

## 2021-04-14 NOTE — Progress Notes (Signed)
PROGRESS NOTE    Alexa Mcdonald  QMG:[id number] DOB: 1920/12/26 DOA: 04/13/2021 PCP: Jonathon Jordan, MD    Chief Complaint  Patient presents with   Shortness of Breath    Brief Narrative:   Alexa Mcdonald is a 87 y.o. female with history  AS s/p TAVR,  diastolic CHF ,persistent A. fib off Eliquis due to falls, CVA, dementia, brought to ED with FTT, shortness of breath, edema, bradycardia, family is interested in lasix and acls meds before considering full comfort measures   Subjective:  Open eyes to voice, follow commands inconsistantly She is on 2liter o2, does not appear to be in respiratory distress She is seen by speech, she remains NPO  Assessment & Plan:   Active Problems:   Symptomatic bradycardia   Pressure injury of skin   Acute on chronic diastolic CHF exacerbation -presents with sob and bilateral lower extremity edema -Venous Doppler no DVT -Chest x-ray with evidence of acute CHF -Continue IV Lasix follow-up he has decision has and is not injection directly into the heart is any thing we do is through the vein is peripheral line  Afib with bradycardia, blood pressure stable, not a candidate for anticoagulation  CKDIIIB  Cr at baseline    Acute metabolic encephalopathy/FTT/ advanced dementia -Family report patient has been having ongoing decline for a few weeks but getting worse and stopped eating since Thursday , patient was able to use a walker to walk a few steps and able to feed herself with supervision a few weeks ago  -patient is more sleepy, does not follow commands consistently, she is seen by speech, currently she is npo -urine culture no growth, blood culture no growth -will not do brain imaging , as will not change management, family agree  Overall poor prognosis, discussed with family, family confirmed for DNR status, family report there was a palliative care ordered prior to admission but they have not met with palliative care yet, they  desires to meet with palliative care here in the hospital, palliative care consult placed on admission   Nutritional Assessment: The patient's BMI is: Body mass index is 20.7 kg/m.[name]  Seen by dietician.  I agree with the assessment and plan as outlined below:  Nutrition Status:           Skin Assessment:  I have examined the patient's skin and I agree with the wound assessment as performed by the wound care RN as outlined below:  Pressure Injury 04/13/21 Sacrum Stage 2 -  Partial thickness loss of dermis presenting as a shallow open injury with a red, pink wound bed without slough. healing (Active)  04/13/21 1825  Location: Sacrum  Location Orientation:   Staging: Stage 2 -  Partial thickness loss of dermis presenting as a shallow open injury with a red, pink wound bed without slough.  Wound Description (Comments): healing  Present on Admission: Yes     Pressure Injury 04/13/21 Heel Left;Posterior Stage 1 -  Intact skin with non-blanchable redness of a localized area usually over a bony prominence. (Active)  04/13/21 1827  Location: Heel  Location Orientation: Left;Posterior  Staging: Stage 1 -  Intact skin with non-blanchable redness of a localized area usually over a bony prominence.  Wound Description (Comments):   Present on Admission: Yes     Pressure Injury 04/13/21 Heel Posterior;Right Stage 1 -  Intact skin with non-blanchable redness of a localized area usually over a bony prominence. (Active)  04/13/21 1828  Location: Heel  Location Orientation: Posterior;Right  Staging: Stage 1 -  Intact skin with non-blanchable redness of a localized area usually over a bony prominence.  Wound Description (Comments):   Present on Admission: Yes    Unresulted Labs (From admission, onward)     Start     Ordered   04/20/21 0500  Creatinine, serum  (enoxaparin (LOVENOX)    CrCl < 30 ml/min)  Weekly,   R     Comments: while on enoxaparin therapy.    04/13/21 1724   04/13/21  1258  Blood Culture (routine x 2)  (Undifferentiated presentation (screening labs and basic nursing orders))  BLOOD CULTURE X 2,   STAT      04/13/21 1257              DVT prophylaxis: enoxaparin (LOVENOX) injection 30 mg Start: 04/13/21 2200   Code Status:DNR, verified with family Family Communication: daughter Leda Gauze over the phone on 9/11 Disposition:   Status is: Inpatient  Dispo: The patient is from: home              Anticipated d/c is to: TBD              Anticipated d/c date is: TBD                Consultants:  Palliative care  Procedures:  none  Antimicrobials:   Anti-infectives (From admission, onward)    None           Objective: Vitals:   04/13/21 1900 04/14/21 0100 04/14/21 0457 04/14/21 0500  BP: (!) 149/51 (!) 151/48 (!) 151/47   Pulse: (!) 41 (!) 50 (!) 43   Resp: _0 Temp: (!) 97.2 F (36.2 C) (!) 97.5 F (36.4 C) (!) 97.5 F (36.4 C)   TempSrc:   Oral   SpO2: 100% 100%    Weight:    53 kg  Height:        Intake/Output Summary (Last 24 hours) at 04/14/2021 1355 Last data filed at 04/14/2021 0300 Gross per 24 hour  Intake 200 ml  Output 500 ml  Net -300 ml   Filed Weights   04/13/21 1236 04/14/21 0500  Weight: 52.2 kg 53 kg    Examination:  General exam: very frail, demented, open eyes to voice, follow commands inconsistently, calm, no agitation Respiratory system: diminished, no wheezing, no rales, no rhonchi, no accessory muscle use respiratory effort normal. Cardiovascular system: S1 & S2 heard, IRRR, bradycardia. + precordial  murmur Gastrointestinal system: Abdomen is nondistended, soft and nontender.  Normal bowel sounds heard. Central nervous system: lethargic, open eyes to voice,  .follow commands inconsistently, nonverbal  Extremities: bilateral lower extremity pitting edema with mild erythema  Skin: Pressure ulcers present on admission as described above Psychiatry: demented, no agitation      Data  Reviewed: I have personally reviewed following labs and imaging studies  CBC: Recent Labs  Lab 04/13/21 1241 04/13/21 1320 04/14/21 0521  WBC 7.2  --  7.8  NEUTROABS 5.7  --   --   HGB 13.9 13.3 12.6  HCT 42.9 39.0 38.3  MCV 96.4  --  94.8  PLT 141*  --  131*    Basic Metabolic Panel: Recent Labs  Lab 04/13/21 1241 04/13/21 1320 04/14/21 0521  NA 140 141 141  K 4.4 4.2 3.8  CL 104 105 104  CO2 28  --  27  GLUCOSE 136* 130* 87  BUN 36* 33* 34*  CREATININE  1.58* 1.60* 1.51*  CALCIUM 9.2  --  8.9    GFR: Estimated Creatinine Clearance: 17.6 mL/min (A) (by C-G formula based on SCr of 1.51 mg/dL (H)).  Liver Function Tests: Recent Labs  Lab 04/13/21 1241  AST 17  ALT 12  ALKPHOS 79  BILITOT 1.5*  PROT 6.8  ALBUMIN 3.6    CBG: No results for input(s): GLUCAP in the last 168 hours.   Recent Results (from the past 240 hour(s))  Urine Culture     Status: None   Collection Time: 04/13/21  1:30 PM   Specimen: In/Out Cath Urine  Result Value Ref Range Status   Specimen Description   Final    IN/OUT CATH URINE Performed at [facility] [address], [place], [place] [address]    Special Requests   Final    NONE Performed at [facility], [place] [address] [place], [place] [address]    Culture   Final    NO GROWTH Performed at [facility], [place] [address] [place],  [address]    Report Status 04/14/2021 FINAL  Final  Resp Panel by RT-PCR (Flu A&B, Covid) Nasopharyngeal Swab     Status: None   Collection Time: 04/13/21  2:07 PM   Specimen: Nasopharyngeal Swab; Nasopharyngeal(NP) swabs in vial transport medium  Result Value Ref Range Status   SARS Coronavirus 2 by RT PCR NEGATIVE NEGATIVE Final    Comment: (NOTE) SARS-CoV-2 target nucleic acids are NOT DETECTED.  The SARS-CoV-2 RNA is generally detectable in upper respiratory specimens during the acute phase of infection. The lowest concentration of  SARS-CoV-2 viral copies this assay can detect is 138 copies/mL. A negative result does not preclude SARS-Cov-2 infection and should not be used as the sole basis for treatment or other patient management decisions. A negative result may occur with  improper specimen collection/handling, submission of specimen other than nasopharyngeal swab, presence of viral mutation(s) within the areas targeted by this assay, and inadequate number of viral copies(<138 copies/mL). A negative result must be combined with clinical observations, patient history, and epidemiological information. The expected result is Negative.  Fact Sheet for Patients:  EntrepreneurPulse.com.au  Fact Sheet for Healthcare Providers:  IncredibleEmployment.be  This test is no t yet approved or cleared by the [place] FDA and  has been authorized for detection and/or diagnosis of SARS-CoV-2 by FDA under an Emergency Use Authorization (EUA). This EUA will remain  in effect (meaning this test can be used) for the duration of the COVID-19 declaration under Section 564(b)(1) of the Act, 21 U.S.C.section 360bbb-3(b)(1), unless the authorization is terminated  or revoked sooner.       Influenza A by PCR NEGATIVE NEGATIVE Final   Influenza B by PCR NEGATIVE NEGATIVE Final    Comment: (NOTE) The Xpert Xpress SARS-CoV-2/FLU/RSV plus assay is intended as an aid in the diagnosis of influenza from Nasopharyngeal swab specimens and should not be used as a sole basis for treatment. Nasal washings and aspirates are unacceptable for Xpert Xpress SARS-CoV-2/FLU/RSV testing.  Fact Sheet for Patients: EntrepreneurPulse.com.au  Fact Sheet for Healthcare Providers: IncredibleEmployment.be  This test is not yet approved or cleared by the [place] FDA and has been authorized for detection and/or diagnosis of SARS-CoV-2 by FDA under an Emergency Use  Authorization (EUA). This EUA will remain in effect (meaning this test can be used) for the duration of the COVID-19 declaration under Section 564(b)(1) of the Act, 21 U.S.C. section 360bbb-3(b)(1),  unless the authorization is terminated or revoked.  Performed at [facility], [place] [address] [place], [place] [address]          Radiology Studies: CT Angio Chest PE W and/or Wo Contrast  Result Date: 04/13/2021 CLINICAL DATA:  PE suspected, high probability EXAM: CT ANGIOGRAPHY CHEST WITH CONTRAST TECHNIQUE: Multidetector CT imaging of the chest was performed using the standard protocol during bolus administration of intravenous contrast. Multiplanar CT image reconstructions and MIPs were obtained to evaluate the vascular anatomy. CONTRAST:  23m OMNIPAQUE IOHEXOL 350 MG/ML SOLN COMPARISON:  CT scan of the chest 10/24/2015 FINDINGS: Cardiovascular: Adequate opacification of the pulmonary arteries to the proximal segmental level. Opacification more distally is limited by relatively poor cardiac output and in adequate contrast opacification. The main pulmonary artery is enlarged at 3.5 cm. Cardiomegaly with right heart enlargement. Contrast material refluxes into the dilated inferior vena cava and hepatic veins. Surgical changes of prior aortic valve replacement. Caseous calcifications along the posterior mitral valve annulus. Atherosclerotic calcifications along the coronary arteries. Atherosclerotic calcifications along the thoracic aorta. No evidence of aneurysm. Mediastinum/Nodes: Similar appearance of massive thyroid goiter with numerous internal calcifications larger on the left than the right extending into the left retrosternal space. Similar findings visible on prior imaging from 2017. no mediastinal mass or adenopathy. Unremarkable esophagus. Lungs/Pleura: Small bilateral layering pleural effusions. Interlobular septal thickening consistent with interstitial edema.  Additionally, there is some mild subpleural reticulation and architectural distortion. Diffuse mild bronchial wall thickening. No pneumothorax. Moderate respiratory motion artifact. No definitive pulmonary nodule identified although evaluation is limited by the underlying parenchymal findings and motion artifact. Upper Abdomen: No acute abnormality. Musculoskeletal: No chest wall abnormality. No acute or significant osseous findings. Review of the MIP images confirms the above findings. IMPRESSION: 1. Negative for pulmonary embolism. 2. Positive for mild congestive heart failure with evidence of elevated right heart pressures. 3. Mild pulmonary edema. 4. Enlarged main pulmonary artery suggests pulmonary arterial hypertension. 5. Peripheral subpleural reticulation and architectural distortion likely represents early fibrotic changes. 6. Stable large thyroid goiter with massive substernal component essentially unchanged dating back to March of 2017. 7. Caseous calcification of the mitral valve annulus. 8. Surgical changes of prior aortic valve replacement. 9. Aortic and coronary artery atherosclerotic calcifications. Aortic Atherosclerosis (ICD10-I70.0). Electronically Signed   By: HJacqulynn CadetM.D.   On: 04/13/2021 15:07   DG Chest Port 1 View  Result Date: 04/13/2021 CLINICAL DATA:  87year old female with shortness of breath EXAM: PORTABLE CHEST 1 VIEW COMPARISON:  12/01/2020 FINDINGS: Cardiomediastinal silhouette unchanged. Reticular opacities of the bilateral lungs with interlobular septal thickening. Fullness in the central vasculature. Blunting of the left costophrenic angle. No pneumothorax. Changes of prior TAVR.  Calcifications of the aortic arch. No displaced fracture. IMPRESSION: Evidence of acute CHF Changes of prior TAVR Electronically Signed   By: JCorrie MckusickD.O.   On: 04/13/2021 13:42   ECHOCARDIOGRAM COMPLETE  Result Date: 04/14/2021    ECHOCARDIOGRAM REPORT   Patient Name:   NTIMBER MARSHMANDate of Exam: 04/14/2021 Medical Rec #:  [id number]       Height:       63.0 in Accession #:    [id number]      Weight:       116.8 lb Date of Birth:  102/14/1922      BSA:          1.539 m Patient Age:    89years  BP:           151/47 mmHg Patient Gender: F                HR:           37 bpm. Exam Location:  Inpatient Procedure: 2D Echo, Cardiac Doppler and Color Doppler Indications:    CHF-Acute Diastolic E52.77  History:        Patient has prior history of Echocardiogram examinations, most                 recent 08/26/2019. Arrythmias:Bradycardia. 1st Degree AV Block,                 LBBB and Atrial Fibrillation; Risk Factors:Hypertension and                 Diabetes. Chronic kidney disease. Interstitial lung disease.                 Aortic Valve: 23 mm Sapien prosthetic, stented (TAVR) valve is                 present in the aortic position. Procedure Date: 11/27/2015.  Sonographer:    Darlina Sicilian RDCS Referring Phys: [id number] Charlesetta Ivory GONFA IMPRESSIONS  1. Compared to 08/26/19, AI is new, mean gradient across aortic valve is higher and MR is now severe.  2. Left ventricular ejection fraction, by estimation, is 50 to 55%. The left ventricle has low normal function. The left ventricle has no regional wall motion abnormalities. There is moderate left ventricular hypertrophy. Left ventricular diastolic parameters are indeterminate.  3. Right ventricular systolic function is moderately reduced. The right ventricular size is mildly enlarged. There is moderately elevated pulmonary artery systolic pressure.  4. Left atrial size was severely dilated.  5. Right atrial size was mildly dilated.  6. The mitral valve is normal in structure. Severe mitral valve regurgitation. No evidence of mitral stenosis. Severe mitral annular calcification.  7. Tricuspid valve regurgitation is mild to moderate.  8. The aortic valve is normal in structure. Aortic valve regurgitation is mild. Moderate aortic valve stenosis.  There is a 23 mm Sapien prosthetic (TAVR) valve present in the aortic position. Procedure Date: 11/27/2015.  9. The inferior vena cava is normal in size with greater than 50% respiratory variability, suggesting right atrial pressure of 3 mmHg. FINDINGS  Left Ventricle: Left ventricular ejection fraction, by estimation, is 50 to 55%. The left ventricle has low normal function. The left ventricle has no regional wall motion abnormalities. The left ventricular internal cavity size was normal in size. There is moderate left ventricular hypertrophy. Left ventricular diastolic parameters are indeterminate. Right Ventricle: The right ventricular size is mildly enlarged. Right ventricular systolic function is moderately reduced. There is moderately elevated pulmonary artery systolic pressure. The tricuspid regurgitant velocity is 3.23 m/s, and with an assumed right atrial pressure of 15 mmHg, the estimated right ventricular systolic pressure is 61.4 mmHg. Left Atrium: Left atrial size was severely dilated. Right Atrium: Right atrial size was mildly dilated. Pericardium: There is no evidence of pericardial effusion. Mitral Valve: The mitral valve is normal in structure. Severe mitral annular calcification. Severe mitral valve regurgitation. No evidence of mitral valve stenosis. Tricuspid Valve: The tricuspid valve is normal in structure. Tricuspid valve regurgitation is mild to moderate. No evidence of tricuspid stenosis. Aortic Valve: The aortic valve is normal in structure. Aortic valve regurgitation is mild. Aortic regurgitation PHT measures 591 msec. Moderate aortic stenosis is present. Aortic  valve mean gradient measures 17.6 mmHg. Aortic valve peak gradient measures  33.8 mmHg. Aortic valve area, by VTI measures 0.74 cm. 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 Procedure Date: 11/27/2015. Pulmonic Valve: The pulmonic valve was normal in structure. Pulmonic valve  regurgitation is mild. No evidence of pulmonic stenosis. Aorta: The aortic root is normal in size and structure. Venous: The inferior vena cava is normal in size with greater than 50% respiratory variability, suggesting right atrial pressure of 3 mmHg. IAS/Shunts: No atrial level shunt detected by color flow Doppler. Additional Comments: Compared to 08/26/19, AI is new, mean gradient across aortic valve is higher and MR is now severe.  LEFT VENTRICLE PLAX 2D LVIDd:         4.40 cm LVIDs:         3.00 cm LV PW:         0.80 cm LV IVS:        0.80 cm LVOT diam:     2.10 cm LV SV:         61 LV SV Index:   40 LVOT Area:     3.46 cm  RIGHT VENTRICLE RV S prime:     4.39 cm/s TAPSE (M-mode): 1.0 cm LEFT ATRIUM             Index       RIGHT ATRIUM           Index LA diam:        4.90 cm 3.18 cm/m  RA Area:     16.30 cm LA Vol (A2C):   80.7 ml 52.44 ml/m RA Volume:   40.50 ml  26.32 ml/m LA Vol (A4C):   83.8 ml 54.46 ml/m LA Biplane Vol: 84.1 ml 54.65 ml/m  AORTIC VALVE                    PULMONIC VALVE AV Area (Vmax):    0.72 cm     PV Vmax:          2.41 m/s AV Area (Vmean):   0.73 cm     PV Peak grad:     23.2 mmHg AV Area (VTI):     0.74 cm     PR End Diast Vel: 6.15 msec AV Vmax:           290.60 cm/s AV Vmean:          189.600 cm/s AV VTI:            0.826 m AV Peak Grad:      33.8 mmHg AV Mean Grad:      17.6 mmHg LVOT Vmax:         60.27 cm/s LVOT Vmean:        39.700 cm/s LVOT VTI:          0.177 m LVOT/AV VTI ratio: 0.21 AI PHT:            591 msec MITRAL VALVE                 TRICUSPID VALVE MV Area (PHT): 5.23 cm      TR Peak grad:   41.7 mmHg MV Decel Time: 145 msec      TR Vmax:        323.00 cm/s MR Peak grad:    77.3 mmHg MR Mean grad:    57.5 mmHg   SHUNTS MR Vmax:  439.50 cm/s Systemic VTI:  0.18 m MR Vmean:        362.0 cm/s  Systemic Diam: 2.10 cm MR PISA:         1.90 cm MR PISA Eff ROA: 11 mm MR PISA Radius:  0.55 cm MV E velocity: 132.00 cm/s MV A velocity: 65.30 cm/s MV E/A ratio:   2.02 Kirk Ruths MD Electronically signed by Kirk Ruths MD Signature Date/Time: 04/14/2021/1:02:02 PM    Final    VAS [place] LOWER EXTREMITY VENOUS (DVT)  Result Date: 04/14/2021  Lower Venous DVT Study Patient Name:  JOLEEN STUCKERT  Date of Exam:   04/13/2021 Medical Rec #: [id number]         Accession #:    [id number] Date of Birth: 22-May-1921        Patient Gender: F Patient Age:   10 years Exam Location:  [facility] Procedure:      VAS [place] LOWER EXTREMITY VENOUS (DVT) Referring Phys: DAN FLOYD --------------------------------------------------------------------------------  Indications: Swelling.  Risk Factors: None identified. Comparison Study: No prior studies. Performing Technologist: Oliver Hum RVT  Examination Guidelines: A complete evaluation includes B-mode imaging, spectral Doppler, color Doppler, and power Doppler as needed of all accessible portions of each vessel. Bilateral testing is considered an integral part of a complete examination. Limited examinations for reoccurring indications may be performed as noted. The reflux portion of the exam is performed with the patient in reverse Trendelenburg.  +---------+---------------+---------+-----------+----------+--------------+ RIGHT    CompressibilityPhasicitySpontaneityPropertiesThrombus Aging +---------+---------------+---------+-----------+----------+--------------+ CFV      Full           Yes      Yes                                 +---------+---------------+---------+-----------+----------+--------------+ SFJ      Full                                                        +---------+---------------+---------+-----------+----------+--------------+ FV Prox  Full                                                        +---------+---------------+---------+-----------+----------+--------------+ FV Mid   Full                                                         +---------+---------------+---------+-----------+----------+--------------+ FV DistalFull                                                        +---------+---------------+---------+-----------+----------+--------------+ PFV      Full                                                        +---------+---------------+---------+-----------+----------+--------------+  POP      Full           Yes      Yes                                 +---------+---------------+---------+-----------+----------+--------------+ PTV      Full                                                        +---------+---------------+---------+-----------+----------+--------------+ PERO     Full                                                        +---------+---------------+---------+-----------+----------+--------------+   +----+---------------+---------+-----------+----------+--------------+ LEFTCompressibilityPhasicitySpontaneityPropertiesThrombus Aging +----+---------------+---------+-----------+----------+--------------+ CFV Full           Yes      Yes                                 +----+---------------+---------+-----------+----------+--------------+     Summary: RIGHT: - There is no evidence of deep vein thrombosis in the lower extremity.  - No cystic structure found in the popliteal fossa.  LEFT: - No evidence of common femoral vein obstruction.  *See table(s) above for measurements and observations. Electronically signed by Jamelle Haring on 04/14/2021 at 10:03:19 AM.    Final         Scheduled Meds:  enoxaparin (LOVENOX) injection  30 mg Subcutaneous Q24H   furosemide  40 mg Intravenous Daily   Continuous Infusions:   LOS: 0 days   Time spent: 78mns Greater than 50% of this time was spent in counseling, explanation of diagnosis, planning of further management, and coordination of care.   Voice Recognition /Viviann Sparedictation system was used to create this note, attempts  have been made to correct errors. Please contact the author with questions and/or clarifications.   FFlorencia Reasons MD PhD FACP [facility]  Available via Epic secure chat 7am-7pm for nonurgent issues Please page for urgent issues To page the attending provider between 7A-7P or the covering provider during after hours 7P-7A, please log into the web site [email or web address] and access using universal [place] password for that web site. If you do not have the password, please call the hospital operator.    04/14/2021, 1:55 PM

## 2021-04-14 NOTE — Evaluation (Signed)
Occupational Therapy Evaluation Patient Details Name: Alexa Mcdonald MRN: [id number] DOB: 07/10/1921 Today's Date: 04/14/2021    History of Present Illness Alexa Mcdonald is a 87 y.o. female with history of dementia, diminished hearing, persistent A. fib off Eliquis due to falls, first degree AVB, diastolic CHF, PPJ-0D, LBBB, diet-controlled DM-2, hypothyroidism, AS s/p TAVR and CVA brought to ED with shortness of breath.   Clinical Impression   Alexa Mcdonald is a 87 year old woman who typically lives at [facility]. She has a caregiver during the day from 8-5 that helps her with ADLs and mobility. Her son reports they typically have her up and "doing something" every 30 minutes. She has hourly rounding at night. ON evaluation she presents a little sleepy but awakes easily. Patient overall min guard to min assist with transfers and mobility using RW. Her movements are very slow but to be expected due to her age. She requires assistance for all ADLs and verbal cues for more participation. She is incontinent at baseline and wears Depends. Patient appears to be close to her baseline in regards to functional mobility and ADLs and her son agrees. HE does state that she has been able to walk up to 80 steps with her caregiver and is usually a little more sassy. From an OT standpoint she has no needs. Recommend she return to her facility with caregiver support.    Follow Up Recommendations  No OT follow up    Equipment Recommendations  None recommended by OT    Recommendations for Other Services       Precautions / Restrictions Precautions Precautions: Fall Precaution Comments: incontinent - wears depends Restrictions Weight Bearing Restrictions: No      Mobility Bed Mobility Overal bed mobility: Needs Assistance Bed Mobility: Supine to Sit     Supine to sit: Min assist     General bed mobility comments: MIn assist to guide LEs and increased time to transfer to side of  bed.    Transfers Overall transfer level: Needs assistance Equipment used: Rolling walker (2 wheeled) Transfers: Sit to/from [facility] Sit to Stand: Min assist Stand pivot transfers: Min guard       General transfer comment: Min assist to cue for standing with increased time to power up. Min guard for ambulation. Has a tendency to keep walker too far forward    Balance Overall balance assessment: Needs assistance Sitting-balance support: No upper extremity supported Sitting balance-Leahy Scale: Fair     Standing balance support: During functional activity Standing balance-Leahy Scale: Poor Standing balance comment: reliant on UEs                           ADL either performed or assessed with clinical judgement   ADL Overall ADL's : At baseline                                             Vision   Vision Assessment?: No apparent visual deficits     Perception     Praxis      Pertinent Vitals/Pain Pain Assessment: No/denies pain     Hand Dominance Right   Extremity/Trunk Assessment Upper Extremity Assessment Upper Extremity Assessment: Overall WFL for tasks assessed   Lower Extremity Assessment Lower Extremity Assessment: Defer to PT evaluation   Cervical / Trunk  Assessment Cervical / Trunk Assessment: Kyphotic   Communication Communication Communication: HOH   Cognition Arousal/Alertness: Lethargic (sleepy) Behavior During Therapy: WFL for tasks assessed/performed Overall Cognitive Status: History of cognitive impairments - at baseline                                 General Comments: Dementia; able to follow commands.   General Comments       Exercises     Shoulder Instructions      Home Living Family/patient expects to be discharged to:: Assisted living                             Home Equipment: Walker - 2 wheels          Prior Functioning/Environment Level of  Independence: Needs assistance  Gait / Transfers Assistance Needed: uses walker and has min guard for ambulation ADL's / Homemaking Assistance Needed: needs assistance for all ADL tasks Communication / Swallowing Assistance Needed: HOH Comments: Patient has a caregiver from 8-5 and then hourly checks at night.        OT Problem List: Decreased activity tolerance;Impaired balance (sitting and/or standing);Decreased cognition;Decreased safety awareness;Decreased knowledge of use of DME or AE      OT Treatment/Interventions:      OT Goals(Current goals can be found in the care plan section) Acute Rehab OT Goals OT Goal Formulation: All assessment and education complete, DC therapy  OT Frequency:     Barriers to D/C:            Co-evaluation              AM-PAC OT "6 Clicks" Daily Activity     Outcome Measure Help from another person eating meals?: A Little Help from another person taking care of personal grooming?: A Little Help from another person toileting, which includes using toliet, bedpan, or urinal?: A Lot Help from another person bathing (including washing, rinsing, drying)?: A Lot Help from another person to put on and taking off regular upper body clothing?: A Lot Help from another person to put on and taking off regular lower body clothing?: A Lot 6 Click Score: 14   End of Session Equipment Utilized During Treatment: Gait belt;Oxygen Nurse Communication: Mobility status  Activity Tolerance: Patient tolerated treatment well Patient left: in chair;with call bell/phone within reach;with chair alarm set;with family/visitor present  OT Visit Diagnosis: Unsteadiness on feet (R26.81);Other symptoms and signs involving cognitive function                Time: 1020-1045 OT Time Calculation (min): 25 min Charges:  OT General Charges $OT Visit: 1 Visit OT Evaluation $OT Eval Moderate Complexity: 1 Mod  Jeniffer Culliver, OTR/L [place]  Office  [phone] Pager: [place] 04/14/2021, 10:54 AM

## 2021-04-14 NOTE — Evaluation (Signed)
Clinical/Bedside Swallow Evaluation Patient Details  Name: Alexa Mcdonald MRN: [id number] Date of Birth: 1921-02-07  Today's Date: 04/14/2021 Time: SLP Start Time (ACUTE ONLY): 61 SLP Stop Time (ACUTE ONLY): 9794 SLP Time Calculation (min) (ACUTE ONLY): 15 min  Past Medical History:  Past Medical History:  Diagnosis Date   1st degree AV block    Bradycardia    Chronic diastolic heart failure ([place]) 02/07/2016   CKD (chronic kidney disease), stage III (HCC)    Colon, diverticulosis    Diabetes mellitus    diet controlled   Essential hypertension    GERD (gastroesophageal reflux disease)    Hematochezia    HOH (hard of hearing)    wears bilateral hearing aids   Hx of orthostatic hypotension    Interstitial lung disease (HCC)    LBBB (left bundle branch block)    Long term current use of anticoagulant therapy    Mediastinal goiter     recurrent s/p resection 1988   Mild CAD    Mitral regurgitation    Osteopenia    Osteoporosis    Paroxysmal atrial fibrillation ([place])    a. CHA2DS2VASc = 5-->coumadin;  b. previously on amio->d/c'd 11/2014 secondary to concern for possible amio lung.   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   23 mm Edwards Sapien 3 transcatheter heart valve placed via percutaneous right transfemoral approach   Severe aortic stenosis    s/p TAVR with stable prosthesis by echo 08/2019 - mean AVG 99mmHg   Stroke ([place])    Tachycardia-bradycardia ([place])    Vitamin D deficiency    Past Surgical History:  Past Surgical History:  Procedure Laterality Date   BILIARY DILATION  10/24/2018   Procedure: BILIARY DILATION;  Surgeon: Jackquline Denmark, MD;  Location: [facility];  Service: Endoscopy;;   BIOPSY  11/11/2018   Procedure: BIOPSY;  Surgeon: Irving Copas., MD;  Location: [facility];  Service: Gastroenterology;;   CARDIAC CATHETERIZATION N/A 10/31/2015   Procedure: Right/Left Heart Cath and Coronary Angiography;  Surgeon:  [name], MD;  Location: [facility];  Service: Cardiovascular;  Laterality: N/A;   CARPAL TUNNEL RELEASE Bilateral    CATARACT EXTRACTION Bilateral    CESAREAN SECTION     x3   CHOLECYSTECTOMY N/A 10/25/2018   Procedure: LAPAROSCOPIC CHOLECYSTECTOMY WITH INTRAOPERATIVE CHOLANGIOGRAM;  Surgeon: Kieth Brightly Arta Bruce, MD;  Location: [facility];  Service: General;  Laterality: N/A;   ERCP N/A 10/24/2018   Procedure: ENDOSCOPIC RETROGRADE CHOLANGIOPANCREATOGRAPHY (ERCP);  Surgeon: Jackquline Denmark, MD;  Location: [facility];  Service: Endoscopy;  Laterality: N/A;   ESOPHAGOGASTRODUODENOSCOPY (EGD) WITH PROPOFOL N/A 11/11/2018   Procedure: ESOPHAGOGASTRODUODENOSCOPY (EGD) WITH PROPOFOL;  Surgeon: Rush Landmark Telford Nab., MD;  Location: [facility];  Service: Gastroenterology;  Laterality: N/A;   mediastinal removal of a goiter     MRI  08/18/07   head (diabetes heart study at [facility])   [place]  10/24/2018   Procedure: REMOVAL OF STONES;  Surgeon: Jackquline Denmark, MD;  Location: [facility];  Service: Endoscopy;;   SPHINCTEROTOMY  10/24/2018   Procedure: Joan Mayans;  Surgeon: Jackquline Denmark, MD;  Location: [facility];  Service: Endoscopy;;   TEE WITHOUT CARDIOVERSION N/A 11/27/2015   Procedure: TRANSESOPHAGEAL ECHOCARDIOGRAM (TEE);  Surgeon: [name], MD;  Location: [facility] [facility];  Service: Open Heart Surgery;  Laterality: N/A;   TRANSCATHETER AORTIC VALVE REPLACEMENT, TRANSFEMORAL N/A 11/27/2015   Procedure: TRANSCATHETER AORTIC VALVE REPLACEMENT, TRANSFEMORAL;  Surgeon: [name], MD;  Location: [facility]  OR;  Service: Open Heart Surgery;  Laterality: N/A;   HPI:  Alexa Mcdonald is a 87 y.o. female with history of dementia, diminished hearing, persistent A. fib off Eliquis due to falls, first degree AVB, diastolic CHF, EAV-4U, LBBB, diet-controlled DM-2, hypothyroidism, AS s/p TAVR and CVA brought to ED with shortness of breath.  She was diagnosed with acute metabolic encephalopathy.   Most recent chest xray was showing evidence of acute CHF and changes of prior TAVR.   Assessment / Plan / Recommendation Clinical Impression  Clinical swallowing evaluation completed using thin liquids via spoon and small straw sips, pureed material and dual textured solids.  The RN stated there were reports of coughing with intake at facility prior to admission.  She is currently NPO awaiting swallowing evaluation.   She is more alert and interactive this date and oriented to at least herself.  She was able to state her name and DOB.  Limited cranial nerve exam was completed due to some difficulty following commands.  Obvious issues were not seen with exception of a breathy quality to her voice and weak cough.  She presented with a possible oral and pharyngeal dysphagia.  The oral phase was marked by a delay in oral transport given purees and dual textured solids.  She was noted to have a mastication motion for pureed material.  Swallow trigger was appreciated.  Patient had cough in response to first sip of thin liquids via spoon.  It was replicated following presentation of solid material given small straw sip of thin liquids.  Prior to administration of solids cough was not seen given thin via small straw sips.  Recommend that she remain NPO with MBS completed tomorrow to fully assess swallowing physiology and safety. SLP Visit Diagnosis: Dysphagia, unspecified (R13.10)    Aspiration Risk  Severe aspiration risk    Diet Recommendation   NPO pending MBS  Medication Administration: Via alternative means    Other  Recommendations Oral Care Recommendations: Oral care QID Other Recommendations: Have oral suction available   Follow up Recommendations Other (comment) (TBD)             Prognosis Prognosis for Safe Diet Advancement: Fair      Swallow Study   General Date of Onset: 04/13/21 HPI: [name] is a 87 y.o. female with history of dementia, diminished hearing, persistent A. fib  off Eliquis due to falls, first degree AVB, diastolic CHF, JWJ-1B, LBBB, diet-controlled DM-2, hypothyroidism, AS s/p TAVR and CVA brought to ED with shortness of breath.  She was diagnosed with acute metabolic encephalopathy.  Most recent chest xray was showing evidence of acute CHF and changes of prior TAVR. Type of Study: Bedside Swallow Evaluation Previous Swallow Assessment: 12/02/2020 Rx for D2/thin, eventually advanced to D3/thin Diet Prior to this Study: NPO Temperature Spikes Noted: No Respiratory Status: Nasal cannula (2L) History of Recent Intubation: No Behavior/Cognition: Alert;Cooperative Oral Cavity Assessment: Dry Oral Care Completed by SLP: No Oral Cavity - Dentition: Missing dentition Vision: Impaired for self-feeding Self-Feeding Abilities: Total assist Patient Positioning: Upright in bed Baseline Vocal Quality: Breathy Volitional Swallow: Able to elicit    Oral/Motor/Sensory Function Overall Oral Motor/Sensory Function: Other (comment) (limited assessment)   Ice Chips Ice chips: Not tested   Thin Liquid Thin Liquid: Impaired Presentation: Spoon;Straw Pharyngeal  Phase Impairments: Cough - Immediate    Nectar Thick Nectar Thick Liquid: Not tested   Honey Thick Honey Thick Liquid: Not tested   Puree Puree: Impaired Presentation: Spoon  Oral Phase Impairments: Impaired mastication Oral Phase Functional Implications: Prolonged oral transit   Solid     Solid: Impaired Presentation: Spoon Oral Phase Impairments: Impaired mastication Oral Phase Functional Implications: Impaired mastication     Shelly Flatten, MA, CCC-SLP Acute Rehab SLP [phone]  Lamar Sprinkles 04/14/2021,1:26 PM

## 2021-04-14 NOTE — Plan of Care (Signed)
  Problem: Education: Goal: Ability to demonstrate management of disease process will improve 04/14/2021 0720 by Marcella Dubs, RN Outcome: Progressing 04/13/2021 1838 by Marcella Dubs, RN Outcome: Progressing   Problem: Activity: Goal: Capacity to carry out activities will improve 04/14/2021 0720 by Marcella Dubs, RN Outcome: Progressing 04/13/2021 1838 by Marcella Dubs, RN Outcome: Progressing   Problem: Cardiac: Goal: Ability to achieve and maintain adequate cardiopulmonary perfusion will improve 04/14/2021 0720 by Marcella Dubs, RN Outcome: Progressing 04/13/2021 1838 by Marcella Dubs, RN Outcome: Progressing

## 2021-04-14 NOTE — Progress Notes (Signed)
  Echocardiogram 2D Echocardiogram has been performed.  Alexa Mcdonald M 04/14/2021, 10:12 AM

## 2021-04-15 ENCOUNTER — Inpatient Hospital Stay (HOSPITAL_COMMUNITY): Payer: Medicare Other

## 2021-04-15 LAB — BASIC METABOLIC PANEL
Anion gap: 17 — ABNORMAL HIGH (ref 5–15)
BUN: 38 mg/dL — ABNORMAL HIGH (ref 8–23)
CO2: 25 mmol/L (ref 22–32)
Calcium: 9 mg/dL (ref 8.9–10.3)
Chloride: 101 mmol/L (ref 98–111)
Creatinine, Ser: 1.66 mg/dL — ABNORMAL HIGH (ref 0.44–1.00)
GFR, Estimated: 28 mL/min — ABNORMAL LOW (ref >60)
Glucose, Bld: 60 mg/dL — ABNORMAL LOW (ref 70–99)
Potassium: 3.8 mmol/L (ref 3.5–5.1)
Sodium: 143 mmol/L (ref 135–145)

## 2021-04-15 LAB — MAGNESIUM: Magnesium: 2.1 mg/dL (ref 1.7–2.4)

## 2021-04-15 LAB — GLUCOSE, CAPILLARY
Glucose-Capillary: 62 mg/dL — ABNORMAL LOW (ref 70–99)
Glucose-Capillary: 77 mg/dL (ref 70–99)

## 2021-04-15 MED ORDER — PANTOPRAZOLE SODIUM 40 MG PO TBEC: 40.0000 mg | DELAYED_RELEASE_TABLET | Freq: Every morning | ORAL | Status: DC

## 2021-04-15 MED ORDER — PANTOPRAZOLE SODIUM 40 MG PO PACK
40.0000 mg | PACK | Freq: Every day | ORAL | Status: DC
  Administered 2021-04-16: 40 mg via ORAL
  Filled 2021-04-15: qty 20

## 2021-04-15 MED ORDER — METHIMAZOLE 5 MG PO TABS
2.5000 mg | ORAL_TABLET | Freq: Every day | ORAL | Status: DC
  Administered 2021-04-15 – 2021-04-16 (×2): 2.5 mg via ORAL
  Filled 2021-04-15 (×2): qty 1

## 2021-04-15 NOTE — Progress Notes (Signed)
PROGRESS NOTE  Alexa Mcdonald  OIN:[id number] DOB: 11-28-1920 DOA: 04/13/2021 PCP: Jonathon Jordan, MD  Brief Narrative: Alexa Mcdonald is a 87 y.o. Marland Kitchens3ex with a history of AS w/s TAVR, chronic HFpEF, persistent AFib, CVA, dementia, IIIb CKD, who presented to the ED from SNF due to failure to thrive, dyspnea, leg swelling   Assessment & Plan: Active Problems:   Symptomatic bradycardia   Pressure injury of skin   Acute diastolic CHF (congestive heart failure) (HCC)  Acute on chronic HFpEF, PAH:   - Continue lasix trial. SCr relatively stable. Though only 940cc out over past 24 hours, no significant intake keeps her in negative balance.   Failure to thrive, acute metabolic encephalopathy on chronic progressive dementia: Cultures negative.  - I suspect this is an irreversible process, and that presentation is representative of global decline as much as it is a mild exacerbation of a baseline of severe multiorgan dysfunction. Reports in chart that artificial feeding is not within goals of care which is appropriate. Regardless, we will continue to discuss goals of care going forward, though based on prior discussions, discharge to a residential hospice may ultimately be an appropriate plan with her ongoing minimal oral intake.   Esophageal dysphagia, GERD: With evidence of mixed texture dysphagia on MBSS as well as morphology consistent with esophageal dysmotility (vs. anatomic defect). Doubt endoscopic evaluation would be helpful at this time.  - Nectar thickened liquids when taken with dysphagia diet. Otherwise, could have supervised thin liquids. Will encourage po intake.  - Continue PPI.  Persistent AFib with bradycardic ventricular response, LBBB:  - Avoid AV blocking agents at this time.  - Has stopped anticoagulation due to frequent falls.  Stage IIIb CKD:  - Monitor with diuresis.   on chronic dementia: Without infection noted.   Diet-controlled T2DM: No intervention planned at  this time.  Lower extremity edema: Negative venous U/S. Suspect venous insufficiency with CHF.  AS s/p TAVR, mitral regurgitation:  - Some evidence of worsening valvular disorders on echocardiogram. Will await discussion with palliative care to decide to ask for cardiology evaluation.   Thrombocytopenia: Noted. Unclear precipitant. Viral testing was negative. No bleeding at this time. Will trend. Hyperthyroidism: TSH 2.472. Free T4 1.24. Large goiter with massive substernal component essentially unchanged dating back to March of 2017 noted on CT. - Restart methimazole.  ILD: Noted. Some peripheral reticulation/architectural distortion on CT noted. No hypoxia.  HTN: No medications at this time. HLD: Holding statin for now Multiple pressure injuries:  Pressure Injury 04/13/21 Sacrum Stage 2 -  Partial thickness loss of dermis presenting as a shallow open injury with a red, pink wound bed without slough. healing (Active)  04/13/21 1825  Location: Sacrum  Location Orientation:   Staging: Stage 2 -  Partial thickness loss of dermis presenting as a shallow open injury with a red, pink wound bed without slough.  Wound Description (Comments): healing  Present on Admission: Yes     Pressure Injury 04/13/21 Heel Left;Posterior Stage 1 -  Intact skin with non-blanchable redness of a localized area usually over a bony prominence. (Active)  04/13/21 1827  Location: Heel  Location Orientation: Left;Posterior  Staging: Stage 1 -  Intact skin with non-blanchable redness of a localized area usually over a bony prominence.  Wound Description (Comments):   Present on Admission: Yes     Pressure Injury 04/13/21 Heel Posterior;Right Stage 1 -  Intact skin with non-blanchable redness of a localized area usually over a bony prominence. (  Active)  04/13/21 1828  Location: Heel  Location Orientation: Posterior;Right  Staging: Stage 1 -  Intact skin with non-blanchable redness of a localized area usually  over a bony prominence.  Wound Description (Comments):   Present on Admission: Yes   DVT prophylaxis: Lovenox 30mg  Code Status: DNR Family Communication: None at bedside Disposition Plan:  Status is: Inpatient  Remains inpatient appropriate because:IV treatments appropriate due to intensity of illness or inability to take PO  Dispo: The patient is from: SNF              Anticipated d/c is to:  TBD              Patient currently is not medically stable to d/c.   Difficult to place patient No  Consultants:  Palliative care  Procedures:  Echo MBSS  Antimicrobials: None   Subjective: Pt confused, will follow some commands, but not regularly. Has not taken much by mouth, though only recently returned from swallow study. She has no complaints, specifically denying shortness of breath or pain.   Objective: Vitals:   04/14/21 1401 04/14/21 2000 04/14/21 2011 04/15/21 0501  BP: (!) 160/40  (!) 139/46 (!) 165/52  Pulse: (!) 42  (!) 51 (!) 53  Resp: (!) 25  (!) 24   Temp: 97.8 F (36.6 C)  98.3 F (36.8 C) 98.1 F (36.7 C)  TempSrc: Oral  Oral Oral  SpO2: 100% 98% 98% 99%  Weight:      Height:        Intake/Output Summary (Last 24 hours) at 04/15/2021 1229 Last data filed at 04/15/2021 1000 Gross per 24 hour  Intake 120 ml  Output 850 ml  Net -730 ml   Filed Weights   04/13/21 1236 04/14/21 0500  Weight: 52.2 kg 53 kg    Gen: Frail, elderly female in no distress Pulm: Non-labored breathing. Mild diffuse crackles without wheezes bilaterally.  CV: Slow, irregular, no JVD, 1+ pitting pedal edema. II/VI holosystolic murmur throughout. GI: Abdomen soft, non-tender, non-distended, with normoactive bowel sounds. No organomegaly or masses felt. Ext: Warm, no deformities Skin: No rashes, lesions or ulcers. Senile purpura noted. Neuro: Alert and disoriented with no specific focal neurological deficits on very limited exam. Psych: Judgement and insight appear impaired. Mood &  affect appropriate.   Data Reviewed: I have personally reviewed following labs and imaging studies  CBC: Recent Labs  Lab 04/13/21 1241 04/13/21 1320 04/14/21 0521  WBC 7.2  --  7.8  NEUTROABS 5.7  --   --   HGB 13.9 13.3 12.6  HCT 42.9 39.0 38.3  MCV 96.4  --  94.8  PLT 141*  --  349*   Basic Metabolic Panel: Recent Labs  Lab 04/13/21 1241 04/13/21 1320 04/14/21 0521 04/15/21 0421  NA 140 141 141 143  K 4.4 4.2 3.8 3.8  CL 104 105 104 101  CO2 28  --  27 25  GLUCOSE 136* 130* 87 60*  BUN 36* 33* 34* 38*  CREATININE 1.58* 1.60* 1.51* 1.66*  CALCIUM 9.2  --  8.9 9.0  MG  --   --   --  2.1   GFR: Estimated Creatinine Clearance: 16 mL/min (A) (by C-G formula based on SCr of 1.66 mg/dL (H)). Liver Function Tests: Recent Labs  Lab 04/13/21 1241  AST 17  ALT 12  ALKPHOS 79  BILITOT 1.5*  PROT 6.8  ALBUMIN 3.6   No results for input(s): LIPASE, AMYLASE in the last 168 hours.  No results for input(s): AMMONIA in the last 168 hours. Coagulation Profile: Recent Labs  Lab 04/13/21 1257 04/14/21 0521  INR 1.6* 1.6*   Cardiac Enzymes: Recent Labs  Lab 04/13/21 1257  CKTOTAL 31*   BNP (last 3 results) No results for input(s): PROBNP in the last 8760 hours. HbA1C: No results for input(s): HGBA1C in the last 72 hours. CBG: Recent Labs  Lab 04/15/21 0735 04/15/21 0906  GLUCAP 62* 77   Lipid Profile: No results for input(s): CHOL, HDL, LDLCALC, TRIG, CHOLHDL, LDLDIRECT in the last 72 hours. Thyroid Function Tests: Recent Labs    04/13/21 1505  TSH 2.472  FREET4 1.24*   Anemia Panel: No results for input(s): VITAMINB12, FOLATE, FERRITIN, TIBC, IRON, RETICCTPCT in the last 72 hours. Urine analysis:    Component Value Date/Time   COLORURINE YELLOW (A) 04/13/2021 1330   APPEARANCEUR CLEAR (A) 04/13/2021 1330   LABSPEC 1.025 04/13/2021 1330   PHURINE 6.0 04/13/2021 1330   GLUCOSEU NEGATIVE 04/13/2021 1330   HGBUR NEGATIVE 04/13/2021 1330    BILIRUBINUR NEGATIVE 04/13/2021 1330   KETONESUR TRACE (A) 04/13/2021 1330   PROTEINUR 100 (A) 04/13/2021 1330   NITRITE NEGATIVE 04/13/2021 1330   LEUKOCYTESUR NEGATIVE 04/13/2021 1330   Recent Results (from the past 240 hour(s))  Blood Culture (routine x 2)     Status: None (Preliminary result)   Collection Time: 04/13/21  1:09 PM   Specimen: BLOOD  Result Value Ref Range Status   Specimen Description   Final    BLOOD SITE NOT SPECIFIED Performed at [facility], [place] [address] [place], [place] [address]    Special Requests   Final    BOTTLES DRAWN AEROBIC AND ANAEROBIC Blood Culture adequate volume Performed at [facility] [address], [place], [place] [address]    Culture   Final    NO GROWTH 1 DAY Performed at [facility], [place] [address] [place], [place] [address]    Report Status PENDING  Incomplete  Urine Culture     Status: None   Collection Time: 04/13/21  1:30 PM   Specimen: In/Out Cath Urine  Result Value Ref Range Status   Specimen Description   Final    IN/OUT CATH URINE Performed at [facility] [address] [place], [place] [address]    Special Requests   Final    NONE Performed at [facility], [place] [address], [place], [place] [address]    Culture   Final    NO GROWTH Performed at [facility], [place] [address] [place], [place] [address]    Report Status 04/14/2021 FINAL  Final  Blood Culture (routine x 2)     Status: None (Preliminary result)   Collection Time: 04/13/21  1:36 PM   Specimen: BLOOD  Result Value Ref Range Status   Specimen Description   Final    BLOOD SITE NOT SPECIFIED Performed at [facility] [address] [place], [place] [address]    Special Requests   Final    BOTTLES DRAWN AEROBIC AND ANAEROBIC Blood Culture results may not be optimal due to an inadequate volume of blood received in culture bottles Performed  at [facility] [address], [place], [place] [address]    Culture   Final    NO GROWTH 1 DAY Performed at [facility], [place] [address], [place],  [address]    Report Status PENDING  Incomplete  Resp Panel by RT-PCR (Flu A&B, Covid) Nasopharyngeal Swab     Status: None   Collection Time: 04/13/21  2:07 PM   Specimen: Nasopharyngeal Swab; Nasopharyngeal(NP) swabs in vial transport medium  Result Value Ref Range Status   SARS Coronavirus 2 by RT PCR NEGATIVE NEGATIVE Final    Comment: (NOTE) SARS-CoV-2 target nucleic acids are NOT DETECTED.  The SARS-CoV-2 RNA is generally detectable in upper respiratory specimens during the acute phase of infection. The lowest concentration of SARS-CoV-2 viral copies this assay can detect is 138 copies/mL. A negative result does not preclude SARS-Cov-2 infection and should not be used as the sole basis for treatment or other patient management decisions. A negative result may occur with  improper specimen collection/handling, submission of specimen other than nasopharyngeal swab, presence of viral mutation(s) within the areas targeted by this assay, and inadequate number of viral copies(<138 copies/mL). A negative result must be combined with clinical observations, patient history, and epidemiological information. The expected result is Negative.  Fact Sheet for Patients:  EntrepreneurPulse.com.au  Fact Sheet for Healthcare Providers:  IncredibleEmployment.be  This test is no t yet approved or cleared by the [place] FDA and  has been authorized for detection and/or diagnosis of SARS-CoV-2 by FDA under an Emergency Use Authorization (EUA). This EUA will remain  in effect (meaning this test can be used) for the duration of the COVID-19 declaration under Section 564(b)(1) of the Act, 21 U.S.C.section 360bbb-3(b)(1), unless the authorization is terminated  or revoked  sooner.       Influenza A by PCR NEGATIVE NEGATIVE Final   Influenza B by PCR NEGATIVE NEGATIVE Final    Comment: (NOTE) The Xpert Xpress SARS-CoV-2/FLU/RSV plus assay is intended as an aid in the diagnosis of influenza from Nasopharyngeal swab specimens and should not be used as a sole basis for treatment. Nasal washings and aspirates are unacceptable for Xpert Xpress SARS-CoV-2/FLU/RSV testing.  Fact Sheet for Patients: EntrepreneurPulse.com.au  Fact Sheet for Healthcare Providers: IncredibleEmployment.be  This test is not yet approved or cleared by the [place] FDA and has been authorized for detection and/or diagnosis of SARS-CoV-2 by FDA under an Emergency Use Authorization (EUA). This EUA will remain in effect (meaning this test can be used) for the duration of the COVID-19 declaration under Section 564(b)(1) of the Act, 21 U.S.C. section 360bbb-3(b)(1), unless the authorization is terminated or revoked.  Performed at [facility], [place] [address], [place], [place] [address]       Radiology Studies: CT Angio Chest PE W and/or Wo Contrast  Result Date: 04/13/2021 CLINICAL DATA:  PE suspected, high probability EXAM: CT ANGIOGRAPHY CHEST WITH CONTRAST TECHNIQUE: Multidetector CT imaging of the chest was performed using the standard protocol during bolus administration of intravenous contrast. Multiplanar CT image reconstructions and MIPs were obtained to evaluate the vascular anatomy. CONTRAST:  62mL OMNIPAQUE IOHEXOL 350 MG/ML SOLN COMPARISON:  CT scan of the chest 10/24/2015 FINDINGS: Cardiovascular: Adequate opacification of the pulmonary arteries to the proximal segmental level. Opacification more distally is limited by relatively poor cardiac output and in adequate contrast opacification. The main pulmonary artery is enlarged at 3.5 cm. Cardiomegaly with right heart enlargement. Contrast material refluxes into the  dilated inferior vena cava and hepatic veins. Surgical changes of prior aortic valve replacement. Caseous calcifications along the posterior mitral valve annulus. Atherosclerotic calcifications along the coronary arteries. Atherosclerotic calcifications along the thoracic aorta. No evidence of aneurysm. Mediastinum/Nodes: Similar appearance of massive thyroid goiter  with numerous internal calcifications larger on the left than the right extending into the left retrosternal space. Similar findings visible on prior imaging from 2017. no mediastinal mass or adenopathy. Unremarkable esophagus. Lungs/Pleura: Small bilateral layering pleural effusions. Interlobular septal thickening consistent with interstitial edema. Additionally, there is some mild subpleural reticulation and architectural distortion. Diffuse mild bronchial wall thickening. No pneumothorax. Moderate respiratory motion artifact. No definitive pulmonary nodule identified although evaluation is limited by the underlying parenchymal findings and motion artifact. Upper Abdomen: No acute abnormality. Musculoskeletal: No chest wall abnormality. No acute or significant osseous findings. Review of the MIP images confirms the above findings. IMPRESSION: 1. Negative for pulmonary embolism. 2. Positive for mild congestive heart failure with evidence of elevated right heart pressures. 3. Mild pulmonary edema. 4. Enlarged main pulmonary artery suggests pulmonary arterial hypertension. 5. Peripheral subpleural reticulation and architectural distortion likely represents early fibrotic changes. 6. Stable large thyroid goiter with massive substernal component essentially unchanged dating back to March of 2017. 7. Caseous calcification of the mitral valve annulus. 8. Surgical changes of prior aortic valve replacement. 9. Aortic and coronary artery atherosclerotic calcifications. Aortic Atherosclerosis (ICD10-I70.0). Electronically Signed   By: Jacqulynn Cadet M.D.   On:  04/13/2021 15:07   DG Chest Port 1 View  Result Date: 04/13/2021 CLINICAL DATA:  87 year old female with shortness of breath EXAM: PORTABLE CHEST 1 VIEW COMPARISON:  12/01/2020 FINDINGS: Cardiomediastinal silhouette unchanged. Reticular opacities of the bilateral lungs with interlobular septal thickening. Fullness in the central vasculature. Blunting of the left costophrenic angle. No pneumothorax. Changes of prior TAVR.  Calcifications of the aortic arch. No displaced fracture. IMPRESSION: Evidence of acute CHF Changes of prior TAVR Electronically Signed   By: Corrie Mckusick D.O.   On: 04/13/2021 13:42   DG Swallowing Func-Speech Pathology  Result Date: 04/15/2021 Table formatting from the original result was not included. Objective Swallowing Evaluation: Type of Study: MBS-Modified Barium Swallow Study  Patient Details Name: BULAH LURIE MRN: [id number] Date of Birth: 15-Nov-1920 Today's Date: 04/15/2021 Time: SLP Start Time (ACUTE ONLY): 0845 -SLP Stop Time (ACUTE ONLY): 0905 SLP Time Calculation (min) (ACUTE ONLY): 20 min Past Medical History: Past Medical History: Diagnosis Date  1st degree AV block   Bradycardia   Chronic diastolic heart failure ([place]) 02/07/2016  CKD (chronic kidney disease), stage III (HCC)   Colon, diverticulosis   Diabetes mellitus   diet controlled  Essential hypertension   GERD (gastroesophageal reflux disease)   Hematochezia   HOH (hard of hearing)   wears bilateral hearing aids  Hx of orthostatic hypotension   Interstitial lung disease (HCC)   LBBB (left bundle branch block)   Long term current use of anticoagulant therapy   Mediastinal goiter    recurrent s/p resection 1988  Mild CAD   Mitral regurgitation   Osteopenia   Osteoporosis   Paroxysmal atrial fibrillation ([place])   a. CHA2DS2VASc = 5-->coumadin;  b. previously on amio->d/c'd 11/2014 secondary to concern for possible amio lung.  Pneumonia   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  23 mm Edwards Sapien 3  transcatheter heart valve placed via percutaneous right transfemoral approach  Severe aortic stenosis   s/p TAVR with stable prosthesis by echo 08/2019 - mean AVG 28mmHg  Stroke ([place])   Tachycardia-bradycardia ([place])   Vitamin D deficiency  Past Surgical History: Past Surgical History: Procedure Laterality Date  BILIARY DILATION  10/24/2018  Procedure: BILIARY DILATION;  Surgeon: Jackquline Denmark, MD;  Location: [facility];  Service: Endoscopy;;  BIOPSY  11/11/2018  Procedure: BIOPSY;  Surgeon: Rush Landmark Telford Nab., MD;  Location: [facility];  Service: Gastroenterology;;  CARDIAC CATHETERIZATION N/A 10/31/2015  Procedure: Right/Left Heart Cath and Coronary Angiography;  Surgeon: [name], MD;  Location: [facility];  Service: Cardiovascular;  Laterality: N/A;  CARPAL TUNNEL RELEASE Bilateral   CATARACT EXTRACTION Bilateral   CESAREAN SECTION    x3  CHOLECYSTECTOMY N/A 10/25/2018  Procedure: LAPAROSCOPIC CHOLECYSTECTOMY WITH INTRAOPERATIVE CHOLANGIOGRAM;  Surgeon: Kieth Brightly Arta Bruce, MD;  Location: [facility];  Service: General;  Laterality: N/A;  ERCP N/A 10/24/2018  Procedure: ENDOSCOPIC RETROGRADE CHOLANGIOPANCREATOGRAPHY (ERCP);  Surgeon: Jackquline Denmark, MD;  Location: [facility];  Service: Endoscopy;  Laterality: N/A;  ESOPHAGOGASTRODUODENOSCOPY (EGD) WITH PROPOFOL N/A 11/11/2018  Procedure: ESOPHAGOGASTRODUODENOSCOPY (EGD) WITH PROPOFOL;  Surgeon: Rush Landmark Telford Nab., MD;  Location: [facility];  Service: Gastroenterology;  Laterality: N/A;  mediastinal removal of a goiter    MRI  08/18/07  head (diabetes heart study at [facility])  [place]  10/24/2018  Procedure: REMOVAL OF STONES;  Surgeon: Jackquline Denmark, MD;  [facility];  Service: Endoscopy;;  SPHINCTEROTOMY  10/24/2018  Procedure: Joan Mayans;  Surgeon: Jackquline Denmark, MD;  [facility];  Service: Endoscopy;;  TEE WITHOUT CARDIOVERSION N/A 11/27/2015  Procedure: TRANSESOPHAGEAL ECHOCARDIOGRAM (TEE);  Surgeon: [name], MD;  Location: [facility];  Service: Open Heart Surgery;  Laterality: N/A;  TRANSCATHETER AORTIC VALVE REPLACEMENT, TRANSFEMORAL N/A 11/27/2015  Procedure: TRANSCATHETER AORTIC VALVE REPLACEMENT, TRANSFEMORAL;  Surgeon: [name], MD;  Location: [facility];  Service: Open Heart Surgery;  Laterality: N/A; HPI: Alexa Mcdonald is a 87 y.o. female with history of dementia, diminished hearing, persistent A. fib off Eliquis due to falls, first degree AVB, diastolic CHF, RXV-4M, LBBB, diet-controlled DM-2, hypothyroidism, AS s/p TAVR and CVA brought to ED with shortness of breath.  She was diagnosed with acute metabolic encephalopathy.  Most recent chest xray was showing evidence of acute CHF and changes of prior TAVR.  Subjective: pleasant, alert, cooperative Assessment / Plan / Recommendation CHL IP CLINICAL IMPRESSIONS 04/15/2021 Clinical Impression Patient presents with a moderate sensorimotor oropharyngeal dysphagia. In addition, oral dysphagia impacted by patient's lack of molars on either side. During oral phase, patient exhibited decreased lingual manipulation and anterior to posterior transit with all boluses. Although not instructed to do so, she masticated barium tablet when given in puree solids and mastication was significantly delayed. She also exhibited piecemeal swallowing with all boluses, more pronounced with puree solids. She exhibited swallow initiation delays to vallecular sinus with puree solids and nectar thick liquids and to pyriform sinus with thin liquids. She only exhibited trace pharyngeal and trace to mild oral residuals post initial swallows with all bolus consistencies. Initially, patient tolerated thin liquids from cup sips with flash penetration (PAS 2) but no aspiration. When thin liquids given as mixed consistency with puree solids, patient exhibited aspiration prior to/during swallow. She also exhibited aspiration of thin liquids with straw sip. Aspiration was silent (PAS 8), however  patient did eventual exhibit a cough when aspirate was already traveling down trachea. With nectar thick liquids, patient did not exhibit penetration or aspiration and swallow initiation delay was at vallecular sinus (as opposed to pyriform sinus with thin liquids). With mixed consistencies of puree solids taken with nectar thick liquids, patient did not exhibit any aspiration or penetration. SLP is recommending Dys 1 solids, nectar thick liquids and allow thin liquids (no straws) between meals. As patient is with advanced dementia and advanced in  age as well, improvement in swallow function not likely and importance will be in swallow safety to reduce aspiration risk. Of note, during esophageal sweep, SLP did observe (no radiologist present to confirm) corkscrew esophagus and appearance of possible dysmotility. Please see image in MBS summary note. SLP Visit Diagnosis Dysphagia, oropharyngeal phase (R13.12) Attention and concentration deficit following -- Frontal lobe and executive function deficit following -- Impact on safety and function Moderate aspiration risk   CHL IP TREATMENT RECOMMENDATION 04/15/2021 Treatment Recommendations Therapy as outlined in treatment plan below   Prognosis 04/15/2021 Prognosis for Safe Diet Advancement Guarded Barriers to Reach Goals Severity of deficits;Cognitive deficits Barriers/Prognosis Comment -- CHL IP DIET RECOMMENDATION 04/15/2021 SLP Diet Recommendations Dysphagia 1 (Puree) solids;Nectar thick liquid;Other (Comment) Liquid Administration via Cup;No straw Medication Administration Crushed with puree Compensations Minimize environmental distractions;Slow rate;Small sips/bites Postural Changes Seated upright at 90 degrees;Remain semi-upright after after feeds/meals (Comment)   CHL IP OTHER RECOMMENDATIONS 04/15/2021 Recommended Consults -- Oral Care Recommendations Oral care BID;Staff/trained caregiver to provide oral care Other Recommendations --   CHL IP FOLLOW UP  RECOMMENDATIONS 04/15/2021 Follow up Recommendations 24 hour supervision/assistance;Skilled Nursing facility   [facility] IP FREQUENCY AND DURATION 04/15/2021 Speech Therapy Frequency (ACUTE ONLY) min 1 x/week Treatment Duration 1 week      CHL IP ORAL PHASE 04/15/2021 Oral Phase -- Oral - Pudding Teaspoon -- Oral - Pudding Cup -- Oral - Honey Teaspoon -- Oral - Honey Cup -- Oral - Nectar Teaspoon -- Oral - Nectar Cup Piecemeal swallowing Oral - Nectar Straw -- Oral - Thin Teaspoon -- Oral - Thin Cup -- Oral - Thin Straw -- Oral - Puree -- Oral - Mech Soft -- Oral - Regular -- Oral - Multi-Consistency -- Oral - Pill -- Oral Phase - Comment --  CHL IP PHARYNGEAL PHASE 04/15/2021 Pharyngeal Phase Impaired Pharyngeal- Pudding Teaspoon -- Pharyngeal -- Pharyngeal- Pudding Cup -- Pharyngeal -- Pharyngeal- Honey Teaspoon -- Pharyngeal -- Pharyngeal- Honey Cup -- Pharyngeal -- Pharyngeal- Nectar Teaspoon -- Pharyngeal -- Pharyngeal- Nectar Cup Delayed swallow initiation-vallecula;Pharyngeal residue - valleculae;Reduced anterior laryngeal mobility Pharyngeal -- Pharyngeal- Nectar Straw -- Pharyngeal -- Pharyngeal- Thin Teaspoon -- Pharyngeal -- Pharyngeal- Thin Cup Delayed swallow initiation-pyriform sinuses;Reduced airway/laryngeal closure;Penetration/Aspiration during swallow;Pharyngeal residue - valleculae;Pharyngeal residue - pyriform Pharyngeal Material enters airway, remains ABOVE vocal cords then ejected out Pharyngeal- Thin Straw Reduced anterior laryngeal mobility;Penetration/Aspiration before swallow;Penetration/Aspiration during swallow;Pharyngeal residue - valleculae;Pharyngeal residue - pyriform Pharyngeal Material enters airway, passes BELOW cords without attempt by patient to eject out (silent aspiration) Pharyngeal- Puree Delayed swallow initiation-vallecula;Pharyngeal residue - valleculae Pharyngeal -- Pharyngeal- Mechanical Soft -- Pharyngeal -- Pharyngeal- Regular -- Pharyngeal -- Pharyngeal- Multi-consistency --  Pharyngeal -- Pharyngeal- Pill Pharyngeal residue - valleculae;Reduced anterior laryngeal mobility;Delayed swallow initiation-vallecula Pharyngeal -- Pharyngeal Comment --  CHL IP CERVICAL ESOPHAGEAL PHASE 04/15/2021 Cervical Esophageal Phase WFL Pudding Teaspoon -- Pudding Cup -- Honey Teaspoon -- Honey Cup -- Nectar Teaspoon -- Nectar Cup -- Nectar Straw -- Thin Teaspoon -- Thin Cup -- Thin Straw -- Puree -- Mechanical Soft -- Regular -- Multi-consistency -- Pill -- Cervical Esophageal Comment -- Sonia Baller, MA, CCC-SLP Speech Therapy [facility]              ECHOCARDIOGRAM COMPLETE  Result Date: 04/14/2021    ECHOCARDIOGRAM REPORT   Patient Name:   MICHE LOUGHRIDGE Date of Exam: 04/14/2021 Medical Rec #:  [id number]        Height:       63.0 in  Accession #:    [id number]       Weight:       116.8 lb Date of Birth:  [birth year]       BSA:          1.539 m Patient Age:    30 years         BP:           151/47 mmHg Patient Gender: F                HR:           37 bpm. Exam Location:  Inpatient Procedure: 2D Echo, Cardiac Doppler and Color Doppler Indications:    CHF-Acute Diastolic H06.23  History:        Patient has prior history of Echocardiogram examinations, most                 recent 08/26/2019. Arrythmias:Bradycardia. 1st Degree AV Block,                 LBBB and Atrial Fibrillation; Risk Factors:Hypertension and                 Diabetes. Chronic kidney disease. Interstitial lung disease.                 Aortic Valve: 23 mm Sapien prosthetic, stented (TAVR) valve is                 present in the aortic position. Procedure Date: 11/27/2015.  Sonographer:    Darlina Sicilian RDCS Referring Phys: [id number] Charlesetta Ivory GONFA IMPRESSIONS  1. Compared to 08/26/19, AI is new, mean gradient across aortic valve is higher and MR is now severe.  2. Left ventricular ejection fraction, by estimation, is 50 to 55%. The left ventricle has low normal function. The left ventricle has no regional wall motion abnormalities.  There is moderate left ventricular hypertrophy. Left ventricular diastolic parameters are indeterminate.  3. Right ventricular systolic function is moderately reduced. The right ventricular size is mildly enlarged. There is moderately elevated pulmonary artery systolic pressure.  4. Left atrial size was severely dilated.  5. Right atrial size was mildly dilated.  6. The mitral valve is normal in structure. Severe mitral valve regurgitation. No evidence of mitral stenosis. Severe mitral annular calcification.  7. Tricuspid valve regurgitation is mild to moderate.  8. The aortic valve is normal in structure. Aortic valve regurgitation is mild. Moderate aortic valve stenosis. There is a 23 mm Sapien prosthetic (TAVR) valve present in the aortic position. Procedure Date: 11/27/2015.  9. The inferior vena cava is normal in size with greater than 50% respiratory variability, suggesting right atrial pressure of 3 mmHg. FINDINGS  Left Ventricle: Left ventricular ejection fraction, by estimation, is 50 to 55%. The left ventricle has low normal function. The left ventricle has no regional wall motion abnormalities. The left ventricular internal cavity size was normal in size. There is moderate left ventricular hypertrophy. Left ventricular diastolic parameters are indeterminate. Right Ventricle: The right ventricular size is mildly enlarged. Right ventricular systolic function is moderately reduced. There is moderately elevated pulmonary artery systolic pressure. The tricuspid regurgitant velocity is 3.23 m/s, and with an assumed right atrial pressure of 15 mmHg, the estimated right ventricular systolic pressure is 17.6 mmHg. Left Atrium: Left atrial size was severely dilated. Right Atrium: Right atrial size was mildly dilated. Pericardium: There is no evidence of pericardial effusion. Mitral Valve: The mitral valve is normal in structure. Severe mitral  annular calcification. Severe mitral valve regurgitation. No evidence of  mitral valve stenosis. Tricuspid Valve: The tricuspid valve is normal in structure. Tricuspid valve regurgitation is mild to moderate. No evidence of tricuspid stenosis. Aortic Valve: The aortic valve is normal in structure. Aortic valve regurgitation is mild. Aortic regurgitation PHT measures 591 msec. Moderate aortic stenosis is present. Aortic valve mean gradient measures 17.6 mmHg. Aortic valve peak gradient measures  33.8 mmHg. Aortic valve area, by VTI measures 0.74 cm. 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 Procedure Date: 11/27/2015. Pulmonic Valve: The pulmonic valve was normal in structure. Pulmonic valve regurgitation is mild. No evidence of pulmonic stenosis. Aorta: The aortic root is normal in size and structure. Venous: The inferior vena cava is normal in size with greater than 50% respiratory variability, suggesting right atrial pressure of 3 mmHg. IAS/Shunts: No atrial level shunt detected by color flow Doppler. Additional Comments: Compared to 08/26/19, AI is new, mean gradient across aortic valve is higher and MR is now severe.  LEFT VENTRICLE PLAX 2D LVIDd:         4.40 cm LVIDs:         3.00 cm LV PW:         0.80 cm LV IVS:        0.80 cm LVOT diam:     2.10 cm LV SV:         61 LV SV Index:   40 LVOT Area:     3.46 cm  RIGHT VENTRICLE RV S prime:     4.39 cm/s TAPSE (M-mode): 1.0 cm LEFT ATRIUM             Index       RIGHT ATRIUM           Index LA diam:        4.90 cm 3.18 cm/m  RA Area:     16.30 cm LA Vol (A2C):   80.7 ml 52.44 ml/m RA Volume:   40.50 ml  26.32 ml/m LA Vol (A4C):   83.8 ml 54.46 ml/m LA Biplane Vol: 84.1 ml 54.65 ml/m  AORTIC VALVE                    PULMONIC VALVE AV Area (Vmax):    0.72 cm     PV Vmax:          2.41 m/s AV Area (Vmean):   0.73 cm     PV Peak grad:     23.2 mmHg AV Area (VTI):     0.74 cm     PR End Diast Vel: 6.15 msec AV Vmax:           290.60 cm/s AV Vmean:          189.600 cm/s AV VTI:             0.826 m AV Peak Grad:      33.8 mmHg AV Mean Grad:      17.6 mmHg LVOT Vmax:         60.27 cm/s LVOT Vmean:        39.700 cm/s LVOT VTI:          0.177 m LVOT/AV VTI ratio: 0.21 AI PHT:            591 msec MITRAL VALVE                 TRICUSPID VALVE MV Area (PHT): 5.23  cm      TR Peak grad:   41.7 mmHg MV Decel Time: 145 msec      TR Vmax:        323.00 cm/s MR Peak grad:    77.3 mmHg MR Mean grad:    57.5 mmHg   SHUNTS MR Vmax:         439.50 cm/s Systemic VTI:  0.18 m MR Vmean:        362.0 cm/s  Systemic Diam: 2.10 cm MR PISA:         1.90 cm MR PISA Eff Mcdonald: 11 mm MR PISA Radius:  0.55 cm MV E velocity: 132.00 cm/s MV A velocity: 65.30 cm/s MV E/A ratio:  2.02 Kirk Ruths MD Electronically signed by Kirk Ruths MD Signature Date/Time: 04/14/2021/1:02:02 PM    Final    VAS [place] LOWER EXTREMITY VENOUS (DVT)  Result Date: 04/14/2021  Lower Venous DVT Study Patient Name:  DAAIYAH BAUMERT  Date of Exam:   04/13/2021 Medical Rec #: [id number]         Accession #:    [id number] Date of Birth: [birth year]        Patient Gender: F Patient Age:   28 years Exam Location:  [facility] Procedure:      VAS [place] LOWER EXTREMITY VENOUS (DVT) Referring Phys: DAN FLOYD --------------------------------------------------------------------------------  Indications: Swelling.  Risk Factors: None identified. Comparison Study: No prior studies. Performing Technologist: Oliver Hum RVT  Examination Guidelines: A complete evaluation includes B-mode imaging, spectral Doppler, color Doppler, and power Doppler as needed of all accessible portions of each vessel. Bilateral testing is considered an integral part of a complete examination. Limited examinations for reoccurring indications may be performed as noted. The reflux portion of the exam is performed with the patient in reverse Trendelenburg.  +---------+---------------+---------+-----------+----------+--------------+ RIGHT     CompressibilityPhasicitySpontaneityPropertiesThrombus Aging +---------+---------------+---------+-----------+----------+--------------+ CFV      Full           Yes      Yes                                 +---------+---------------+---------+-----------+----------+--------------+ SFJ      Full                                                        +---------+---------------+---------+-----------+----------+--------------+ FV Prox  Full                                                        +---------+---------------+---------+-----------+----------+--------------+ FV Mid   Full                                                        +---------+---------------+---------+-----------+----------+--------------+ FV DistalFull                                                        +---------+---------------+---------+-----------+----------+--------------+  PFV      Full                                                        +---------+---------------+---------+-----------+----------+--------------+ POP      Full           Yes      Yes                                 +---------+---------------+---------+-----------+----------+--------------+ PTV      Full                                                        +---------+---------------+---------+-----------+----------+--------------+ PERO     Full                                                        +---------+---------------+---------+-----------+----------+--------------+   +----+---------------+---------+-----------+----------+--------------+ LEFTCompressibilityPhasicitySpontaneityPropertiesThrombus Aging +----+---------------+---------+-----------+----------+--------------+ CFV Full           Yes      Yes                                 +----+---------------+---------+-----------+----------+--------------+     Summary: RIGHT: - There is no evidence of deep vein thrombosis in the lower  extremity.  - No cystic structure found in the popliteal fossa.  LEFT: - No evidence of common femoral vein obstruction.  *See table(s) above for measurements and observations. Electronically signed by Jamelle Haring on 04/14/2021 at 10:03:19 AM.    Final     Scheduled Meds:  albuterol  2.5 mg Nebulization BID   enoxaparin (LOVENOX) injection  30 mg Subcutaneous Q24H   furosemide  40 mg Intravenous Daily   Continuous Infusions:   LOS: 1 day   Time spent: 35 minutes.  Patrecia Pour, MD [facility] [email or web address] 04/15/2021, 12:29 PM

## 2021-04-15 NOTE — Progress Notes (Addendum)
Modified Barium Swallow Progress Note  Patient Details  Name: Alexa Mcdonald MRN: [id number] Date of Birth: 1921-05-13  Today's Date: 04/15/2021  Modified Barium Swallow completed.  Full report located under Chart Review in the Imaging Section.  Brief recommendations include the following:  Clinical Impression  Patient presents with a moderate sensorimotor oropharyngeal dysphagia. In addition, oral dysphagia impacted by patient's lack of molars on either side. During oral phase, patient exhibited decreased lingual manipulation and anterior to posterior transit with all boluses. Although not instructed to do so, she masticated barium tablet when given in puree solids and mastication was significantly delayed. She also exhibited piecemeal swallowing with all boluses, more pronounced with puree solids. She exhibited swallow initiation delays to vallecular sinus with puree solids and nectar thick liquids and to pyriform sinus with thin liquids. She only exhibited trace pharyngeal and trace to mild oral residuals post initial swallows with all bolus consistencies. Initially, patient tolerated thin liquids from cup sips with flash penetration (PAS 2) but no aspiration. When thin liquids given as mixed consistency with puree solids, patient exhibited aspiration prior to/during swallow. She also exhibited aspiration of thin liquids with straw sip. Aspiration was silent (PAS 8), however patient did eventual exhibit a cough when aspirate was already traveling down trachea. With nectar thick liquids, patient did not exhibit penetration or aspiration and swallow initiation delay was at vallecular sinus (as opposed to pyriform sinus with thin liquids). With mixed consistencies of puree solids taken with nectar thick liquids, patient did not exhibit any aspiration or penetration. SLP is recommending Dys 1 solids, nectar thick liquids and allow thin liquids (no straws) between meals. As patient is with advanced  dementia and advanced in age as well, improvement in swallow function not likely and importance will be in swallow safety to reduce aspiration risk. SLP did observe (no radiologist present to confirm) appearance of corkscrew esophagus and appearance of dysmotility. (See image below)     Swallow Evaluation Recommendations       SLP Diet Recommendations: Dysphagia 1 (Puree) solids;Nectar thick liquid;Other (Comment) (may have thin liquids (no straws) between meals)   Liquid Administration via: Cup;No straw   Medication Administration: Crushed with puree   Supervision: Staff to assist with self feeding;Full supervision/cueing for compensatory strategies;Full assist for feeding   Compensations: Minimize environmental distractions;Slow rate;Small sips/bites   Postural Changes: Seated upright at 90 degrees;Remain semi-upright after after feeds/meals (Comment) (20-30 minutes)   Oral Care Recommendations: Oral care BID;Staff/trained caregiver to provide oral care        Sonia Baller, MA, CCC-SLP Speech Therapy

## 2021-04-15 NOTE — Progress Notes (Addendum)
Manufacturing engineer [facility]) Hospital Liaison: RN note    Notified by Transition of Care Manger, Randall Hiss CSW of patient/family request for [facility] services at [place] after discharge. Chart and patient information under review by [facility] physician. Hospice eligibility confirmed.    Writer spoke with son, Jenny Reichmann to initiate education related to hospice philosophy, services and team approach to care. jJohn verbalized understanding of information given. Per discussion, plan is for discharge to home by PTAR.   Please send signed and completed DNR form home with patient/family. Patient will need prescriptions for discharge comfort medications.     [facility] aware of the above. Please notify [facility] when patient is ready to leave the unit at discharge. (Call [phone] or [phone] after 5pm.) [facility] information and contact numbers given to  John.      A Please do not hesitate to call with questions.   Thank you,   Farrel Gordon, RN, [facility] Liaison   [phone]

## 2021-04-15 NOTE — TOC Progression Note (Signed)
Transition of Care [facility]) - Progression Note    Patient Details  Name: Alexa Mcdonald MRN: [id number] Date of Birth: 04-Feb-1921  Transition of Care [facility]) CM/SW Contact  Ross Ludwig,  Phone Number: 04/15/2021, 2:42 PM  Clinical Narrative:     CSW was informed that palliative spoke to patient's family and they have decided they want to return to [place] with hospice services following.  Patient has 24/7 caregivers with her.  CSW made referral to [facility].  CSW to continue to follow patient's progress throughout discharge planning.        Expected Discharge Plan and Services                                                 Social Determinants of Health (SDOH) Interventions    Readmission Risk Interventions Readmission Risk Prevention Plan 12/04/2020  Transportation Screening Complete  PCP or Specialist Appt within 3-5 Days Complete  HRI or [place] Complete  Social Work Consult for [place] Planning/Counseling Complete  Palliative Care Screening Complete  Medication Review [employer]) Complete  Some recent data might be hidden

## 2021-04-15 NOTE — Plan of Care (Signed)
Alexa Mcdonald has eaten 25% or less of her meals today, with encouragement. She was assisted oob to the [facility] and then to the chair, requiring 2 max assist. She denies pain/SOB. Fatigues easily and sleeps most of the shift. Palliative Care team met with pt and granddaughter at the bedside and spoke with pt's son by phone.  Problem: Education: Goal: Ability to demonstrate management of disease process will improve Outcome: Progressing Goal: Ability to verbalize understanding of medication therapies will improve Outcome: Progressing   Problem: Activity: Goal: Capacity to carry out activities will improve Outcome: Progressing   Problem: Cardiac: Goal: Ability to achieve and maintain adequate cardiopulmonary perfusion will improve Outcome: Progressing

## 2021-04-16 LAB — BASIC METABOLIC PANEL
Anion gap: 10 (ref 5–15)
BUN: 43 mg/dL — ABNORMAL HIGH (ref 8–23)
CO2: 27 mmol/L (ref 22–32)
Calcium: 8.7 mg/dL — ABNORMAL LOW (ref 8.9–10.3)
Chloride: 103 mmol/L (ref 98–111)
Creatinine, Ser: 1.63 mg/dL — ABNORMAL HIGH (ref 0.44–1.00)
GFR, Estimated: 29 mL/min — ABNORMAL LOW (ref >60)
Glucose, Bld: 193 mg/dL — ABNORMAL HIGH (ref 70–99)
Potassium: 3.4 mmol/L — ABNORMAL LOW (ref 3.5–5.1)
Sodium: 140 mmol/L (ref 135–145)

## 2021-04-16 MED ORDER — ONDANSETRON 4 MG PO TBDP: 4.0000 mg | ORAL_TABLET | Freq: Three times a day (TID) | ORAL | 0 refills | Status: AC | PRN

## 2021-04-16 MED ORDER — FUROSEMIDE 20 MG PO TABS: 20.0000 mg | ORAL_TABLET | Freq: Every day | ORAL | 0 refills | Status: AC

## 2021-04-16 NOTE — Discharge Summary (Signed)
Physician Discharge Summary  MARIGOLD MOM IRC:[id number] DOB: 1921-01-30 DOA: 04/13/2021  PCP: Jonathon Jordan, MD  Admit date: 04/13/2021 Discharge date: 04/16/2021  Admitted From: ILF ([facility]) Disposition: ILF w/hospice   Recommendations for Outpatient Follow-up:  Comfort measures. MOST form completed.  Home Health: [facility] Equipment/Devices: None new Discharge Condition: Stable for discharge, guarded prognosis  CODE STATUS: DNR (Signed gold form in chart to travel w/patient) Diet recommendation: Ad Lib. Strict recommendations for safe intake would be Dysphagia 1, necter-thickened liquids w/meals. Otherwise thin liquids.   Brief/Interim Summary: Alexa Mcdonald is a 87 y.o. Marland Kitchens3ex with a history of AS w/s TAVR, chronic HFpEF, persistent AFib, CVA, dementia, IIIb CKD, who presented to the ED from SNF due to failure to thrive, dyspnea, leg swelling. Pallaitive care was consulted and guided goals of care discussions. The patient was diuresed with improvement in swelling, stable renal function, though continues to not take much by mouth, certainly not an adequate amount to sustain life. They have opted for hospice care. Currently her clinical needs suggest she can be managed effectively at ILF, but may transition to [facility] based on ongoing assessments.   Discharge Diagnoses:  Active Problems:   Symptomatic bradycardia   Pressure injury of skin   Acute diastolic CHF (congestive heart failure) (HCC)  Acute on chronic HFpEF, PAH:   - Continue lasix 20mg  daily at discharge. SCr stable.     Failure to thrive, acute metabolic encephalopathy on chronic progressive dementia: Cultures negative.  - I suspect this is an irreversible process, and that presentation is representative of global decline as much as it is a mild exacerbation of a baseline of severe multiorgan dysfunction. Reports in chart that artificial feeding is not within goals of care which is appropriate.     Esophageal dysphagia, GERD: With evidence of mixed texture dysphagia on MBSS as well as morphology consistent with esophageal dysmotility (vs. anatomic defect). Doubt endoscopic evaluation would be helpful at this time.  - Nectar thickened liquids when taken with dysphagia diet. Otherwise, could have supervised thin liquids. Can offer any po the patient would like, but would not in any way force feed her. Despite maximum encouragement, po intake remained <25% for 1 of 3 meals per day here.  - Continue PPI.   Persistent AFib with bradycardic ventricular response, LBBB:  - Avoid AV blocking agents at this time.  - Has stopped anticoagulation due to frequent falls.   Stage IIIb CKD:  - Monitor with diuresis.    Diet-controlled T2DM: No intervention planned at this time.   Lower extremity edema: Negative venous U/S. Suspect venous insufficiency with CHF.   AS s/p TAVR, mitral regurgitation:  - Some evidence of worsening valvular disorders on echocardiogram. Would not be candidate nor be suspected to benefit from any further interventions.   Thrombocytopenia: Noted. Unclear precipitant. Viral testing was negative. No bleeding at this time.    Hyperthyroidism: TSH 2.472. Free T4 1.24. Large goiter with massive substernal component essentially unchanged dating back to March of 2017 noted on CT. - Restart methimazole.   ILD: Noted. Some peripheral reticulation/architectural distortion on CT noted. No hypoxia.   HTN: No medications at this time.  HLD: Statin continued, but should be addressed by hospice Re: appropriateness.   Multiple pressure injuries: These should be treated supportively.  RN Pressure Injury Documentation: Pressure Injury 04/13/21 Sacrum Stage 2 -  Partial thickness loss of dermis presenting as a shallow open injury with a red, pink wound bed without slough.  healing (Active)  04/13/21 1825  Location: Sacrum  Location Orientation:   Staging: Stage 2 -  Partial thickness  loss of dermis presenting as a shallow open injury with a red, pink wound bed without slough.  Wound Description (Comments): healing  Present on Admission: Yes     Pressure Injury 04/13/21 Heel Left;Posterior Stage 1 -  Intact skin with non-blanchable redness of a localized area usually over a bony prominence. (Active)  04/13/21 1827  Location: Heel  Location Orientation: Left;Posterior  Staging: Stage 1 -  Intact skin with non-blanchable redness of a localized area usually over a bony prominence.  Wound Description (Comments):   Present on Admission: Yes     Pressure Injury 04/13/21 Heel Posterior;Right Stage 1 -  Intact skin with non-blanchable redness of a localized area usually over a bony prominence. (Active)  04/13/21 1828  Location: Heel  Location Orientation: Posterior;Right  Staging: Stage 1 -  Intact skin with non-blanchable redness of a localized area usually over a bony prominence.  Wound Description (Comments):   Present on Admission: Yes   Discharge Instructions Discharge Instructions     Diet general   Complete by: As directed    Recommend nectar-thickened liquids when taken with solids. Otherwise, can have thin liquids between meals. Medications should be administered in puree.   Discharge instructions   Complete by: As directed    Start taking lasix 20mg  once daily (instead of every other day) to help with fluid accumulation in the lungs. Otherwise, several pills have been stopped because of the risk they pose with your difficulty swallowing. Please discuss medications with [facility] after discharge going forward.   No dressing needed   Complete by: As directed       Allergies as of 04/16/2021       Reactions   Amoxicillin Other (See Comments)   tongue felt bruised Did it involve swelling of the face/tongue/throat, SOB, or low BP? No Did it involve sudden or severe rash/hives, skin peeling, or any reaction on the inside of your mouth or nose? No Did  you need to seek medical attention at a hospital or doctor's office? No When did it last happen?     childhood  If all above answers are "NO", may proceed with cephalosporin use.   Celebrex [celecoxib] Other (See Comments)   Mouth sores   Amoxicillin Other (See Comments)   "Made my tongue felt bruised"   Celecoxib Other (See Comments)   Made the mouth sore        Medication List     STOP taking these medications    atorvastatin 10 MG tablet Commonly known as: LIPITOR   CALCIUM PO   Eliquis 2.5 MG Tabs tablet Generic drug: apixaban   VITAMIN B-12 PO   VITAMIN D3 PO       TAKE these medications    acetaminophen 500 MG tablet Commonly known as: TYLENOL Take 1-2 tablets (500-1,000 mg total) by mouth every 6 (six) hours as needed for mild pain or moderate pain (or headaches).   furosemide 20 MG tablet Commonly known as: LASIX Take 1 tablet (20 mg total) by mouth daily. What changed:  when to take this reasons to take this   methimazole 5 MG tablet Commonly known as: TAPAZOLE Take 2.5 mg by mouth in the morning.   OCUVITE PRESERVISION PO Take 1 capsule by mouth in the morning.   ondansetron 4 MG disintegrating tablet Commonly known as: ZOFRAN-ODT Take 1 tablet (4 mg total)  by mouth every 8 (eight) hours as needed for nausea or vomiting.   pantoprazole 40 MG tablet Commonly known as: PROTONIX TAKE 1 TABLET(40 MG) BY MOUTH DAILY AT 6 AM What changed: See the new instructions.   Systane 0.4-0.3 % Soln Generic drug: Polyethyl Glycol-Propyl Glycol Place 1 drop into both eyes daily as needed (dry eyes).               Discharge Care Instructions  (From admission, onward)           Start     Ordered   04/16/21 0000  No dressing needed        04/16/21 2725            Follow-up Information     Jonathon Jordan, MD Follow up.   Specialty: Family Medicine Contact information: [place]  [id number] [address] [facility] [address].   Specialty: Hospice and Palliative Medicine Contact information: [place] [id number] [phone]               Allergies  Allergen Reactions   Amoxicillin Other (See Comments)    tongue felt bruised Did it involve swelling of the face/tongue/throat, SOB, or low BP? No Did it involve sudden or severe rash/hives, skin peeling, or any reaction on the inside of your mouth or nose? No Did you need to seek medical attention at a hospital or doctor's office? No When did it last happen?     childhood  If all above answers are "NO", may proceed with cephalosporin use.     Celebrex [Celecoxib] Other (See Comments)    Mouth sores   Amoxicillin Other (See Comments)    "Made my tongue felt bruised"   Celecoxib Other (See Comments)    Made the mouth sore    Consultations: Palliative care  Procedures/Studies: CT Angio Chest PE W and/or Wo Contrast  Result Date: 04/13/2021 CLINICAL DATA:  PE suspected, high probability EXAM: CT ANGIOGRAPHY CHEST WITH CONTRAST TECHNIQUE: Multidetector CT imaging of the chest was performed using the standard protocol during bolus administration of intravenous contrast. Multiplanar CT image reconstructions and MIPs were obtained to evaluate the vascular anatomy. CONTRAST:  55mL OMNIPAQUE IOHEXOL 350 MG/ML SOLN COMPARISON:  CT scan of the chest 10/24/2015 FINDINGS: Cardiovascular: Adequate opacification of the pulmonary arteries to the proximal segmental level. Opacification more distally is limited by relatively poor cardiac output and in adequate contrast opacification. The main pulmonary artery is enlarged at 3.5 cm. Cardiomegaly with right heart enlargement. Contrast material refluxes into the dilated inferior vena cava and hepatic veins. Surgical changes of prior aortic valve replacement. Caseous calcifications along the posterior mitral valve annulus.  Atherosclerotic calcifications along the coronary arteries. Atherosclerotic calcifications along the thoracic aorta. No evidence of aneurysm. Mediastinum/Nodes: Similar appearance of massive thyroid goiter with numerous internal calcifications larger on the left than the right extending into the left retrosternal space. Similar findings visible on prior imaging from 2017. no mediastinal mass or adenopathy. Unremarkable esophagus. Lungs/Pleura: Small bilateral layering pleural effusions. Interlobular septal thickening consistent with interstitial edema. Additionally, there is some mild subpleural reticulation and architectural distortion. Diffuse mild bronchial wall thickening. No pneumothorax. Moderate respiratory motion artifact. No definitive pulmonary nodule identified although evaluation is limited by the underlying parenchymal findings and motion artifact. Upper Abdomen: No acute abnormality. Musculoskeletal: No chest wall abnormality. No acute or significant osseous findings. Review of the MIP  images confirms the above findings. IMPRESSION: 1. Negative for pulmonary embolism. 2. Positive for mild congestive heart failure with evidence of elevated right heart pressures. 3. Mild pulmonary edema. 4. Enlarged main pulmonary artery suggests pulmonary arterial hypertension. 5. Peripheral subpleural reticulation and architectural distortion likely represents early fibrotic changes. 6. Stable large thyroid goiter with massive substernal component essentially unchanged dating back to March of 2017. 7. Caseous calcification of the mitral valve annulus. 8. Surgical changes of prior aortic valve replacement. 9. Aortic and coronary artery atherosclerotic calcifications. Aortic Atherosclerosis (ICD10-I70.0). Electronically Signed   By: Jacqulynn Cadet M.D.   On: 04/13/2021 15:07   DG Chest Port 1 View  Result Date: 04/13/2021 CLINICAL DATA:  87 year old female with shortness of breath EXAM: PORTABLE CHEST 1 VIEW  COMPARISON:  12/01/2020 FINDINGS: Cardiomediastinal silhouette unchanged. Reticular opacities of the bilateral lungs with interlobular septal thickening. Fullness in the central vasculature. Blunting of the left costophrenic angle. No pneumothorax. Changes of prior TAVR.  Calcifications of the aortic arch. No displaced fracture. IMPRESSION: Evidence of acute CHF Changes of prior TAVR Electronically Signed   By: Corrie Mckusick D.O.   On: 04/13/2021 13:42   DG Swallowing Func-Speech Pathology  Result Date: 04/15/2021 Table formatting from the original result was not included. Objective Swallowing Evaluation: Type of Study: MBS-Modified Barium Swallow Study  Patient Details Name: Alexa Mcdonald MRN: [id number] Date of Birth: [birth year] Today's Date: 04/15/2021 Time: SLP Start Time (ACUTE ONLY): 0845 -SLP Stop Time (ACUTE ONLY): 0905 SLP Time Calculation (min) (ACUTE ONLY): 20 min Past Medical History: Past Medical History: Diagnosis Date  1st degree AV block   Bradycardia   Chronic diastolic heart failure ([place]) 02/07/2016  CKD (chronic kidney disease), stage III (HCC)   Colon, diverticulosis   Diabetes mellitus   diet controlled  Essential hypertension   GERD (gastroesophageal reflux disease)   Hematochezia   HOH (hard of hearing)   wears bilateral hearing aids  Hx of orthostatic hypotension   Interstitial lung disease (HCC)   LBBB (left bundle branch block)   Long term current use of anticoagulant therapy   Mediastinal goiter    recurrent s/p resection 1988  Mild CAD   Mitral regurgitation   Osteopenia   Osteoporosis   Paroxysmal atrial fibrillation ([place])   a. CHA2DS2VASc = 5-->coumadin;  b. previously on amio->d/c'd 11/2014 secondary to concern for possible amio lung.  Pneumonia   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  23 mm Edwards Sapien 3 transcatheter heart valve placed via percutaneous right transfemoral approach  Severe aortic stenosis   s/p TAVR with stable prosthesis by echo 08/2019 -  mean AVG 74mmHg  Stroke ([place])   Tachycardia-bradycardia ([place])   Vitamin D deficiency  Past Surgical History: Past Surgical History: Procedure Laterality Date  BILIARY DILATION  10/24/2018  Procedure: BILIARY DILATION;  Surgeon: Jackquline Denmark, MD;  Location: [facility] ENDOSCOPY;  Service: Endoscopy;;  BIOPSY  11/11/2018  Procedure: BIOPSY;  Surgeon: Irving Copas., MD;  Location: [facility] ENDOSCOPY;  Service: Gastroenterology;;  CARDIAC CATHETERIZATION N/A 10/31/2015  Procedure: Right/Left Heart Cath and Coronary Angiography;  Surgeon: [name], MD;  Location: [place] CV LAB;  Service: Cardiovascular;  Laterality: N/A;  CARPAL TUNNEL RELEASE Bilateral   CATARACT EXTRACTION Bilateral   CESAREAN SECTION    x3  CHOLECYSTECTOMY N/A 10/25/2018  Procedure: LAPAROSCOPIC CHOLECYSTECTOMY WITH INTRAOPERATIVE CHOLANGIOGRAM;  Surgeon: Kieth Brightly Arta Bruce, MD;  Location: [facility] ORS;  Service: General;  Laterality: N/A;  ERCP N/A 10/24/2018  Procedure: ENDOSCOPIC RETROGRADE  CHOLANGIOPANCREATOGRAPHY (ERCP);  Surgeon: Jackquline Denmark, MD;  Location: [facility];  Service: Endoscopy;  Laterality: N/A;  ESOPHAGOGASTRODUODENOSCOPY (EGD) WITH PROPOFOL N/A 11/11/2018  Procedure: ESOPHAGOGASTRODUODENOSCOPY (EGD) WITH PROPOFOL;  Surgeon: Rush Landmark Telford Nab., MD;  Location: [place];  Service: Gastroenterology;  Laterality: N/A;  mediastinal removal of a goiter    MRI  08/18/07  head (diabetes heart study at [facility])  [place]  10/24/2018  Procedure: REMOVAL OF STONES;  Surgeon: Jackquline Denmark, MD;  Location: [facility] ENDOS[facility];  Service: Endoscopy;;  SPHINCTEROTOMY  10/24/2018  Procedure: Joan Mayans;  Surgeon: Jackquline Denmark, MD;  Location: [facility];  Service: Endoscopy;;  TEE WITHOUT CARDIOVERSION N/A 11/27/2015  Procedure: TRANSESOPHAGEAL ECHOCARDIOGRAM (TEE);  Surgeon: [name], MD;  Location: [place];  Service: Open Heart Surgery;  Laterality: N/A;  TRANSCATHETER AORTIC VALVE REPLACEMENT, TRANSFEMORAL N/A 11/27/2015  Procedure:  TRANSCATHETER AORTIC VALVE REPLACEMENT, TRANSFEMORAL;  Surgeon: [name], MD;  Location: [place];  Service: Open Heart Surgery;  Laterality: N/A; HPI: Alexa Mcdonald is a 87 y.o. female with history of dementia, diminished hearing, persistent A. fib off Eliquis due to falls, first degree AVB, diastolic CHF, [address], LBBB, diet-controlled DM-2, hypothyroidism, AS s/p TAVR and CVA brought to ED with shortness of breath.  She was diagnosed with acute metabolic encephalopathy.  Most recent chest xray was showing evidence of acute CHF and changes of prior TAVR.  Subjective: pleasant, alert, cooperative Assessment / Plan / Recommendation CHL IP CLINICAL IMPRESSIONS 04/15/2021 Clinical Impression Patient presents with a moderate sensorimotor oropharyngeal dysphagia. In addition, oral dysphagia impacted by patient's lack of molars on either side. During oral phase, patient exhibited decreased lingual manipulation and anterior to posterior transit with all boluses. Although not instructed to do so, she masticated barium tablet when given in puree solids and mastication was significantly delayed. She also exhibited piecemeal swallowing with all boluses, more pronounced with puree solids. She exhibited swallow initiation delays to vallecular sinus with puree solids and nectar thick liquids and to pyriform sinus with thin liquids. She only exhibited trace pharyngeal and trace to mild oral residuals post initial swallows with all bolus consistencies. Initially, patient tolerated thin liquids from cup sips with flash penetration (PAS 2) but no aspiration. When thin liquids given as mixed consistency with puree solids, patient exhibited aspiration prior to/during swallow. She also exhibited aspiration of thin liquids with straw sip. Aspiration was silent (PAS 8), however patient did eventual exhibit a cough when aspirate was already traveling down trachea. With nectar thick liquids, patient did not exhibit penetration or  aspiration and swallow initiation delay was at vallecular sinus (as opposed to pyriform sinus with thin liquids). With mixed consistencies of puree solids taken with nectar thick liquids, patient did not exhibit any aspiration or penetration. SLP is recommending Dys 1 solids, nectar thick liquids and allow thin liquids (no straws) between meals. As patient is with advanced dementia and advanced in age as well, improvement in swallow function not likely and importance will be in swallow safety to reduce aspiration risk. Of note, during esophageal sweep, SLP did observe (no radiologist present to confirm) corkscrew esophagus and appearance of possible dysmotility. Please see image in MBS summary note. SLP Visit Diagnosis Dysphagia, oropharyngeal phase (R13.12) Attention and concentration deficit following -- Frontal lobe and executive function deficit following -- Impact on safety and function Moderate aspiration risk   CHL IP TREATMENT RECOMMENDATION 04/15/2021 Treatment Recommendations Therapy as outlined in treatment plan below   Prognosis 04/15/2021 Prognosis for Safe Diet Advancement Guarded  Barriers to Reach Goals Severity of deficits;Cognitive deficits Barriers/Prognosis Comment -- CHL IP DIET RECOMMENDATION 04/15/2021 SLP Diet Recommendations Dysphagia 1 (Puree) solids;Nectar thick liquid;Other (Comment) Liquid Administration via Cup;No straw Medication Administration Crushed with puree Compensations Minimize environmental distractions;Slow rate;Small sips/bites Postural Changes Seated upright at 90 degrees;Remain semi-upright after after feeds/meals (Comment)   CHL IP OTHER RECOMMENDATIONS 04/15/2021 Recommended Consults -- Oral Care Recommendations Oral care BID;Staff/trained caregiver to provide oral care Other Recommendations --   CHL IP FOLLOW UP RECOMMENDATIONS 04/15/2021 Follow up Recommendations 24 hour supervision/assistance;Skilled Nursing facility   [facility] IP FREQUENCY AND DURATION 04/15/2021 Speech Therapy  Frequency (ACUTE ONLY) min 1 x/week Treatment Duration 1 week      CHL IP ORAL PHASE 04/15/2021 Oral Phase -- Oral - Pudding Teaspoon -- Oral - Pudding Cup -- Oral - Honey Teaspoon -- Oral - Honey Cup -- Oral - Nectar Teaspoon -- Oral - Nectar Cup Piecemeal swallowing Oral - Nectar Straw -- Oral - Thin Teaspoon -- Oral - Thin Cup -- Oral - Thin Straw -- Oral - Puree -- Oral - Mech Soft -- Oral - Regular -- Oral - Multi-Consistency -- Oral - Pill -- Oral Phase - Comment --  CHL IP PHARYNGEAL PHASE 04/15/2021 Pharyngeal Phase Impaired Pharyngeal- Pudding Teaspoon -- Pharyngeal -- Pharyngeal- Pudding Cup -- Pharyngeal -- Pharyngeal- Honey Teaspoon -- Pharyngeal -- Pharyngeal- Honey Cup -- Pharyngeal -- Pharyngeal- Nectar Teaspoon -- Pharyngeal -- Pharyngeal- Nectar Cup Delayed swallow initiation-vallecula;Pharyngeal residue - valleculae;Reduced anterior laryngeal mobility Pharyngeal -- Pharyngeal- Nectar Straw -- Pharyngeal -- Pharyngeal- Thin Teaspoon -- Pharyngeal -- Pharyngeal- Thin Cup Delayed swallow initiation-pyriform sinuses;Reduced airway/laryngeal closure;Penetration/Aspiration during swallow;Pharyngeal residue - valleculae;Pharyngeal residue - pyriform Pharyngeal Material enters airway, remains ABOVE vocal cords then ejected out Pharyngeal- Thin Straw Reduced anterior laryngeal mobility;Penetration/Aspiration before swallow;Penetration/Aspiration during swallow;Pharyngeal residue - valleculae;Pharyngeal residue - pyriform Pharyngeal Material enters airway, passes BELOW cords without attempt by patient to eject out (silent aspiration) Pharyngeal- Puree Delayed swallow initiation-vallecula;Pharyngeal residue - valleculae Pharyngeal -- Pharyngeal- Mechanical Soft -- Pharyngeal -- Pharyngeal- Regular -- Pharyngeal -- Pharyngeal- Multi-consistency -- Pharyngeal -- Pharyngeal- Pill Pharyngeal residue - valleculae;Reduced anterior laryngeal mobility;Delayed swallow initiation-vallecula Pharyngeal -- Pharyngeal  Comment --  CHL IP CERVICAL ESOPHAGEAL PHASE 04/15/2021 Cervical Esophageal Phase WFL Pudding Teaspoon -- Pudding Cup -- Honey Teaspoon -- Honey Cup -- Nectar Teaspoon -- Nectar Cup -- Nectar Straw -- Thin Teaspoon -- Thin Cup -- Thin Straw -- Puree -- Mechanical Soft -- Regular -- Multi-consistency -- Pill -- Cervical Esophageal Comment -- Sonia Baller, MA, CCC-SLP Speech Therapy [facility]              ECHOCARDIOGRAM COMPLETE  Result Date: 04/14/2021    ECHOCARDIOGRAM REPORT   Patient Name:   Alexa Mcdonald Date of Exam: 04/14/2021 Medical Rec #:  [id number]        Height:       63.0 in Accession #:    [id number]       Weight:       116.8 lb Date of Birth:  26-Feb-1921       BSA:          1.539 m Patient Age:    4 years         BP:           151/47 mmHg Patient Gender: F                HR:           37  bpm. Exam Location:  Inpatient Procedure: 2D Echo, Cardiac Doppler and Color Doppler Indications:    CHF-Acute Diastolic F62.13  History:        Patient has prior history of Echocardiogram examinations, most                 recent 08/26/2019. Arrythmias:Bradycardia. 1st Degree AV Block,                 LBBB and Atrial Fibrillation; Risk Factors:Hypertension and                 Diabetes. Chronic kidney disease. Interstitial lung disease.                 Aortic Valve: 23 mm Sapien prosthetic, stented (TAVR) valve is                 present in the aortic position. Procedure Date: 11/27/2015.  Sonographer:    Darlina Sicilian RDCS Referring Phys: [id number] Charlesetta Ivory GONFA IMPRESSIONS  1. Compared to 08/26/19, AI is new, mean gradient across aortic valve is higher and MR is now severe.  2. Left ventricular ejection fraction, by estimation, is 50 to 55%. The left ventricle has low normal function. The left ventricle has no regional wall motion abnormalities. There is moderate left ventricular hypertrophy. Left ventricular diastolic parameters are indeterminate.  3. Right ventricular systolic function is moderately  reduced. The right ventricular size is mildly enlarged. There is moderately elevated pulmonary artery systolic pressure.  4. Left atrial size was severely dilated.  5. Right atrial size was mildly dilated.  6. The mitral valve is normal in structure. Severe mitral valve regurgitation. No evidence of mitral stenosis. Severe mitral annular calcification.  7. Tricuspid valve regurgitation is mild to moderate.  8. The aortic valve is normal in structure. Aortic valve regurgitation is mild. Moderate aortic valve stenosis. There is a 23 mm Sapien prosthetic (TAVR) valve present in the aortic position. Procedure Date: 11/27/2015.  9. The inferior vena cava is normal in size with greater than 50% respiratory variability, suggesting right atrial pressure of 3 mmHg. FINDINGS  Left Ventricle: Left ventricular ejection fraction, by estimation, is 50 to 55%. The left ventricle has low normal function. The left ventricle has no regional wall motion abnormalities. The left ventricular internal cavity size was normal in size. There is moderate left ventricular hypertrophy. Left ventricular diastolic parameters are indeterminate. Right Ventricle: The right ventricular size is mildly enlarged. Right ventricular systolic function is moderately reduced. There is moderately elevated pulmonary artery systolic pressure. The tricuspid regurgitant velocity is 3.23 m/s, and with an assumed right atrial pressure of 15 mmHg, the estimated right ventricular systolic pressure is 69.6 mmHg. Left Atrium: Left atrial size was severely dilated. Right Atrium: Right atrial size was mildly dilated. Pericardium: There is no evidence of pericardial effusion. Mitral Valve: The mitral valve is normal in structure. Severe mitral annular calcification. Severe mitral valve regurgitation. No evidence of mitral valve stenosis. Tricuspid Valve: The tricuspid valve is normal in structure. Tricuspid valve regurgitation is mild to moderate. No evidence of tricuspid  stenosis. Aortic Valve: The aortic valve is normal in structure. Aortic valve regurgitation is mild. Aortic regurgitation PHT measures 591 msec. Moderate aortic stenosis is present. Aortic valve mean gradient measures 17.6 mmHg. Aortic valve peak gradient measures  33.8 mmHg. Aortic valve area, by VTI measures 0.74 cm. 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 Procedure Date: 11/27/2015. Pulmonic Valve:  The pulmonic valve was normal in structure. Pulmonic valve regurgitation is mild. No evidence of pulmonic stenosis. Aorta: The aortic root is normal in size and structure. Venous: The inferior vena cava is normal in size with greater than 50% respiratory variability, suggesting right atrial pressure of 3 mmHg. IAS/Shunts: No atrial level shunt detected by color flow Doppler. Additional Comments: Compared to 08/26/19, AI is new, mean gradient across aortic valve is higher and MR is now severe.  LEFT VENTRICLE PLAX 2D LVIDd:         4.40 cm LVIDs:         3.00 cm LV PW:         0.80 cm LV IVS:        0.80 cm LVOT diam:     2.10 cm LV SV:         61 LV SV Index:   40 LVOT Area:     3.46 cm  RIGHT VENTRICLE RV S prime:     4.39 cm/s TAPSE (M-mode): 1.0 cm LEFT ATRIUM             Index       RIGHT ATRIUM           Index LA diam:        4.90 cm 3.18 cm/m  RA Area:     16.30 cm LA Vol (A2C):   80.7 ml 52.44 ml/m RA Volume:   40.50 ml  26.32 ml/m LA Vol (A4C):   83.8 ml 54.46 ml/m LA Biplane Vol: 84.1 ml 54.65 ml/m  AORTIC VALVE                    PULMONIC VALVE AV Area (Vmax):    0.72 cm     PV Vmax:          2.41 m/s AV Area (Vmean):   0.73 cm     PV Peak grad:     23.2 mmHg AV Area (VTI):     0.74 cm     PR End Diast Vel: 6.15 msec AV Vmax:           290.60 cm/s AV Vmean:          189.600 cm/s AV VTI:            0.826 m AV Peak Grad:      33.8 mmHg AV Mean Grad:      17.6 mmHg LVOT Vmax:         60.27 cm/s LVOT Vmean:        39.700 cm/s LVOT VTI:          0.177 m  LVOT/AV VTI ratio: 0.21 AI PHT:            591 msec MITRAL VALVE                 TRICUSPID VALVE MV Area (PHT): 5.23 cm      TR Peak grad:   41.7 mmHg MV Decel Time: 145 msec      TR Vmax:        323.00 cm/s MR Peak grad:    77.3 mmHg MR Mean grad:    57.5 mmHg   SHUNTS MR Vmax:         439.50 cm/s Systemic VTI:  0.18 m MR Vmean:        362.0 cm/s  Systemic Diam: 2.10 cm MR PISA:         1.90 cm MR PISA Eff ROA:  11 mm MR PISA Radius:  0.55 cm MV E velocity: 132.00 cm/s MV A velocity: 65.30 cm/s MV E/A ratio:  2.02 Kirk Ruths MD Electronically signed by Kirk Ruths MD Signature Date/Time: 04/14/2021/1:02:02 PM    Final    VAS [place] LOWER EXTREMITY VENOUS (DVT)  Result Date: 04/14/2021  Lower Venous DVT Study Patient Name:  Alexa Mcdonald  Date of Exam:   04/13/2021 Medical Rec #: [id number]         Accession #:    [id number] Date of Birth: [birth year]        Patient Gender: F Patient Age:   63 years Exam Location:  [facility] Procedure:      VAS [place] LOWER EXTREMITY VENOUS (DVT) Referring Phys: DAN FLOYD --------------------------------------------------------------------------------  Indications: Swelling.  Risk Factors: None identified. Comparison Study: No prior studies. Performing Technologist: Oliver Hum RVT  Examination Guidelines: A complete evaluation includes B-mode imaging, spectral Doppler, color Doppler, and power Doppler as needed of all accessible portions of each vessel. Bilateral testing is considered an integral part of a complete examination. Limited examinations for reoccurring indications may be performed as noted. The reflux portion of the exam is performed with the patient in reverse Trendelenburg.  +---------+---------------+---------+-----------+----------+--------------+ RIGHT    CompressibilityPhasicitySpontaneityPropertiesThrombus Aging +---------+---------------+---------+-----------+----------+--------------+ CFV      Full           Yes      Yes                                  +---------+---------------+---------+-----------+----------+--------------+ SFJ      Full                                                        +---------+---------------+---------+-----------+----------+--------------+ FV Prox  Full                                                        +---------+---------------+---------+-----------+----------+--------------+ FV Mid   Full                                                        +---------+---------------+---------+-----------+----------+--------------+ FV DistalFull                                                        +---------+---------------+---------+-----------+----------+--------------+ PFV      Full                                                        +---------+---------------+---------+-----------+----------+--------------+ POP      Full           Yes  Yes                                 +---------+---------------+---------+-----------+----------+--------------+ PTV      Full                                                        +---------+---------------+---------+-----------+----------+--------------+ PERO     Full                                                        +---------+---------------+---------+-----------+----------+--------------+   +----+---------------+---------+-----------+----------+--------------+ LEFTCompressibilityPhasicitySpontaneityPropertiesThrombus Aging +----+---------------+---------+-----------+----------+--------------+ CFV Full           Yes      Yes                                 +----+---------------+---------+-----------+----------+--------------+     Summary: RIGHT: - There is no evidence of deep vein thrombosis in the lower extremity.  - No cystic structure found in the popliteal fossa.  LEFT: - No evidence of common femoral vein obstruction.  *See table(s) above for measurements and observations. Electronically  signed by Jamelle Haring on 04/14/2021 at 10:03:19 AM.    Final       Subjective: Tired, sleeping, not very interactive.   Discharge Exam: Vitals:   04/15/21 2114 04/16/21 0614  BP: (!) 168/49 (!) 146/60  Pulse: (!) 54 (!) 47  Resp: 18   Temp: 98.1 F (36.7 C) 97.8 F (36.6 C)  SpO2: 95% 96%   General: Elderly resting quietly in bed, very weak and frail. No acute distress. Does not, and has not, appeared to be in pain.  Labs: BNP (last 3 results) Recent Labs    11/30/20 2032 04/13/21 1844  BNP 450.4* 4,967.5*   Basic Metabolic Panel: Recent Labs  Lab 04/13/21 1241 04/13/21 1320 04/14/21 0521 04/15/21 0421 04/16/21 0408  NA 140 141 141 143 140  K 4.4 4.2 3.8 3.8 3.4*  CL 104 105 104 101 103  CO2 28  --  27 25 27   GLUCOSE 136* 130* 87 60* 193*  BUN 36* 33* 34* 38* 43*  CREATININE 1.58* 1.60* 1.51* 1.66* 1.63*  CALCIUM 9.2  --  8.9 9.0 8.7*  MG  --   --   --  2.1  --    Liver Function Tests: Recent Labs  Lab 04/13/21 1241  AST 17  ALT 12  ALKPHOS 79  BILITOT 1.5*  PROT 6.8  ALBUMIN 3.6   No results for input(s): LIPASE, AMYLASE in the last 168 hours. No results for input(s): AMMONIA in the last 168 hours. CBC: Recent Labs  Lab 04/13/21 1241 04/13/21 1320 04/14/21 0521  WBC 7.2  --  7.8  NEUTROABS 5.7  --   --   HGB 13.9 13.3 12.6  HCT 42.9 39.0 38.3  MCV 96.4  --  94.8  PLT 141*  --  131*   Cardiac Enzymes: Recent Labs  Lab 04/13/21 1257  CKTOTAL 31*   BNP: Invalid input(s): POCBNP CBG: Recent Labs  Lab 04/15/21 0735 04/15/21  0906  GLUCAP 62* 77   D-Dimer No results for input(s): DDIMER in the last 72 hours. Hgb A1c No results for input(s): HGBA1C in the last 72 hours. Lipid Profile No results for input(s): CHOL, HDL, LDLCALC, TRIG, CHOLHDL, LDLDIRECT in the last 72 hours. Thyroid function studies Recent Labs    04/13/21 1505  TSH 2.472   Anemia work up No results for input(s): VITAMINB12, FOLATE, FERRITIN, TIBC, IRON,  RETICCTPCT in the last 72 hours. Urinalysis    Component Value Date/Time   COLORURINE YELLOW (A) 04/13/2021 1330   APPEARANCEUR CLEAR (A) 04/13/2021 1330   LABSPEC 1.025 04/13/2021 1330   PHURINE 6.0 04/13/2021 1330   GLUCOSEU NEGATIVE 04/13/2021 1330   HGBUR NEGATIVE 04/13/2021 1330   BILIRUBINUR NEGATIVE 04/13/2021 1330   KETONESUR TRACE (A) 04/13/2021 1330   PROTEINUR 100 (A) 04/13/2021 1330   NITRITE NEGATIVE 04/13/2021 [place] 04/13/2021 1330    Microbiology Recent Results (from the past 240 hour(s))  Blood Culture (routine x 2)     Status: None (Preliminary result)   Collection Time: 04/13/21  1:09 PM   Specimen: BLOOD  Result Value Ref Range Status   Specimen Description   Final    BLOOD SITE NOT SPECIFIED Performed at [facility] [address] [place], [place] [address]    Special Requests   Final    BOTTLES DRAWN AEROBIC AND ANAEROBIC Blood Culture adequate volume Performed at [facility] [address], [place], [place] [address]    Culture   Final    NO GROWTH 2 DAYS Performed at [facility] [address], [place], [place] [address]    Report Status PENDING  Incomplete  Urine Culture     Status: None   Collection Time: 04/13/21  1:30 PM   Specimen: In/Out Cath Urine  Result Value Ref Range Status   Specimen Description   Final    IN/OUT CATH URINE Performed at [facility] [address], [place], [place] [address]    Special Requests   Final    NONE Performed at [facility], [place] [address], [place], [place] [address]    Culture   Final    NO GROWTH Performed at [facility], [place] [address] [place], [place] [address]    Report Status 04/14/2021 FINAL  Final  Blood Culture (routine x 2)     Status: None (Preliminary result)   Collection Time: 04/13/21  1:36 PM   Specimen: BLOOD  Result Value Ref Range Status   Specimen Description    Final    BLOOD SITE NOT SPECIFIED Performed at [facility] [address], [place], [place] [address]    Special Requests   Final    BOTTLES DRAWN AEROBIC AND ANAEROBIC Blood Culture results may not be optimal due to an inadequate volume of blood received in culture bottles Performed at [facility] [address], [place], [place] [address]    Culture   Final    NO GROWTH 2 DAYS Performed at [facility] [address] [place],  [address]    Report Status PENDING  Incomplete  Resp Panel by RT-PCR (Flu A&B, Covid) Nasopharyngeal Swab     Status: None   Collection Time: 04/13/21  2:07 PM   Specimen: Nasopharyngeal Swab; Nasopharyngeal(NP) swabs in vial transport medium  Result Value Ref Range Status   SARS Coronavirus 2 by RT  PCR NEGATIVE NEGATIVE Final    Comment: (NOTE) SARS-CoV-2 target nucleic acids are NOT DETECTED.  The SARS-CoV-2 RNA is generally detectable in upper respiratory specimens during the acute phase of infection. The lowest concentration of SARS-CoV-2 viral copies this assay can detect is 138 copies/mL. A negative result does not preclude SARS-Cov-2 infection and should not be used as the sole basis for treatment or other patient management decisions. A negative result may occur with  improper specimen collection/handling, submission of specimen other than nasopharyngeal swab, presence of viral mutation(s) within the areas targeted by this assay, and inadequate number of viral copies(<138 copies/mL). A negative result must be combined with clinical observations, patient history, and epidemiological information. The expected result is Negative.  Fact Sheet for Patients:  EntrepreneurPulse.com.au  Fact Sheet for Healthcare Providers:  IncredibleEmployment.be  This test is no t yet approved or cleared by the [place] FDA and  has been authorized for detection and/or  diagnosis of SARS-CoV-2 by FDA under an Emergency Use Authorization (EUA). This EUA will remain  in effect (meaning this test can be used) for the duration of the COVID-19 declaration under Section 564(b)(1) of the Act, 21 U.S.C.section 360bbb-3(b)(1), unless the authorization is terminated  or revoked sooner.       Influenza A by PCR NEGATIVE NEGATIVE Final   Influenza B by PCR NEGATIVE NEGATIVE Final    Comment: (NOTE) The Xpert Xpress SARS-CoV-2/FLU/RSV plus assay is intended as an aid in the diagnosis of influenza from Nasopharyngeal swab specimens and should not be used as a sole basis for treatment. Nasal washings and aspirates are unacceptable for Xpert Xpress SARS-CoV-2/FLU/RSV testing.  Fact Sheet for Patients: EntrepreneurPulse.com.au  Fact Sheet for Healthcare Providers: IncredibleEmployment.be  This test is not yet approved or cleared by the [place] FDA and has been authorized for detection and/or diagnosis of SARS-CoV-2 by FDA under an Emergency Use Authorization (EUA). This EUA will remain in effect (meaning this test can be used) for the duration of the COVID-19 declaration under Section 564(b)(1) of the Act, 21 U.S.C. section 360bbb-3(b)(1), unless the authorization is terminated or revoked.  Performed at [facility], [place] [address], [place], [place] [address]     Time coordinating discharge: Approximately 40 minutes  Patrecia Pour, MD  [facility] 04/16/2021, 6:45 AM

## 2021-04-16 NOTE — Progress Notes (Signed)
Pt's son Jenny Reichmann and [facility] hospice called and made aware that pt has been picked up by EMS for transport back to [facility].

## 2021-04-16 NOTE — TOC Transition Note (Addendum)
Transition of Care [facility]) - CM/SW Discharge Note   Patient Details  Name: Alexa Mcdonald MRN: [id number] Date of Birth: [birth year]  [facility] of [facility]) CM/SW Contact:  Ross Ludwig, LCSW Phone Number: 04/16/2021, 11:18 AM   Clinical Narrative:     11:00am  CSW attempted to contact patient's son and daughter, had to leave a message for them to find out if any equipment is needed.    11:45am  CSW was informed by bedside nurse that she spoke to patient's son.  He is aware that patient is discharging today.  Patient will be going home with home hospice through [facility].  EMS has been called to transport patient per family request.  CSW signing off please reconsult with any other social work needs, home hospice agency has been notified of planned discharge.    Patient was open to [facility] for home health, they have been notified that patient is going back to facility with hospice services through [facility].  Patient's son is aware that patient is discharging today.    Final next level of care: Other (comment) ([facility]) Barriers to Discharge: Barriers Resolved   Patient Goals and CMS Choice Patient states their goals for this hospitalization and ongoing recovery are:: To return to [facility] with hospice services      Discharge Placement  [facility] with home hospice services.                     Discharge Plan and Services                                     Social Determinants of Health (SDOH) Interventions     Readmission Risk Interventions Readmission Risk Prevention Plan 12/04/2020  Transportation Screening Complete  PCP or Specialist Appt within 3-5 Days Complete  HRI or [place] Complete  Social Work Consult for [place] Planning/Counseling Complete  Palliative Care Screening Complete  Medication Review Press photographer) Complete  Some recent data might be hidden

## 2021-04-16 NOTE — Plan of Care (Signed)

## 2021-04-16 NOTE — Consult Note (Signed)
Palliative Care  Consultation Note Reason: Hospice Options  Alexa Mcdonald is a 87 yo woman who suffered a stroke about 3 months ago and has multiple chronic end stage medical problems including CKD, Diabetes, CHF and worsening valvular heart disease who was admitted with afib, altered mental status and dyspnea. She is from [place] and resided in an apartment there where she had 24/7 in home care givers. Her functional status has been declining since that time, although she did better than expected for a short time following her stoke.   I met with patient, her grandaughter at bedside and her son Alexa Mcdonald by phone. The main concern at this time is her failure to thrive, poor intake and worsening dysphagia. Their goals are comfort and quality of life and they believe she is nearing EOL. They had already been followed by palliative care and were supposed to enroll in hospice but she was admitted to the hospital instead.   Recommendations:  DNR, Son had questions about previous conversations with multiple different providers about code status, I reassured him that any medical treatment including heart medications that would help his mom and contribute to her QOL would be offered and she would not be deprived of anything she needed to achieve that goal-and also reassured him we would allow for a natural death to occur with comfort and dignity when that time came per patient and family goals. Will place [place] on Chart. ACP filed in [place]. She appears comfortable, she does not complain of pain. She slept through most of the encounter. Very frail and weak. I strongly recommended comfort feeding as tolerated and certainly no "force feeding"-gentle encouragement and full assistance would be best. No need for modified diet and she should be allowed to have thin liquids if she desires them, care and caution should be taken when giving POs. Discharge with Hospice Services at [facility].  They do not desire any rehospitalization or repeat ED visits. I think it would be helpful for [place] to complete a MOST form for [facility] and have Hospice provider reiterate this goal to the staff at [facility] as well. I anticipate her condition will slowly continue to deteriorate-she may progress to needing a hospice facility for her terminal care -especially if she gets volume overloaded with CHF- treating shortness of breath and aggressively diuresing will be required. Will order PRN Roxanol to have in the event of respiratory distress.  Lane Hacker, DO Palliative Medicine  Time: 70 minutes Greater than 50%  of this time was spent counseling and coordinating care related to the above assessment and plan.

## 2021-04-17 DIAGNOSIS — R1310 Dysphagia, unspecified: Secondary | ICD-10-CM | POA: Diagnosis not present

## 2021-04-17 DIAGNOSIS — E1122 Type 2 diabetes mellitus with diabetic chronic kidney disease: Secondary | ICD-10-CM | POA: Diagnosis not present

## 2021-04-17 DIAGNOSIS — D696 Thrombocytopenia, unspecified: Secondary | ICD-10-CM | POA: Diagnosis not present

## 2021-04-17 DIAGNOSIS — G311 Senile degeneration of brain, not elsewhere classified: Secondary | ICD-10-CM | POA: Diagnosis not present

## 2021-04-17 DIAGNOSIS — L89152 Pressure ulcer of sacral region, stage 2: Secondary | ICD-10-CM | POA: Diagnosis not present

## 2021-04-17 DIAGNOSIS — G9341 Metabolic encephalopathy: Secondary | ICD-10-CM | POA: Diagnosis not present

## 2021-04-17 DIAGNOSIS — H04129 Dry eye syndrome of unspecified lacrimal gland: Secondary | ICD-10-CM | POA: Diagnosis not present

## 2021-04-17 DIAGNOSIS — I350 Nonrheumatic aortic (valve) stenosis: Secondary | ICD-10-CM | POA: Diagnosis not present

## 2021-04-17 DIAGNOSIS — R296 Repeated falls: Secondary | ICD-10-CM | POA: Diagnosis not present

## 2021-04-17 DIAGNOSIS — R627 Adult failure to thrive: Secondary | ICD-10-CM | POA: Diagnosis not present

## 2021-04-17 DIAGNOSIS — I5033 Acute on chronic diastolic (congestive) heart failure: Secondary | ICD-10-CM | POA: Diagnosis not present

## 2021-04-17 DIAGNOSIS — K219 Gastro-esophageal reflux disease without esophagitis: Secondary | ICD-10-CM | POA: Diagnosis not present

## 2021-04-17 DIAGNOSIS — N184 Chronic kidney disease, stage 4 (severe): Secondary | ICD-10-CM | POA: Diagnosis not present

## 2021-04-17 DIAGNOSIS — I447 Left bundle-branch block, unspecified: Secondary | ICD-10-CM | POA: Diagnosis not present

## 2021-04-17 DIAGNOSIS — E0510 Thyrotoxicosis with toxic single thyroid nodule without thyrotoxic crisis or storm: Secondary | ICD-10-CM | POA: Diagnosis not present

## 2021-04-17 DIAGNOSIS — I4891 Unspecified atrial fibrillation: Secondary | ICD-10-CM | POA: Diagnosis not present

## 2021-04-17 DIAGNOSIS — R001 Bradycardia, unspecified: Secondary | ICD-10-CM | POA: Diagnosis not present

## 2021-04-17 DIAGNOSIS — L89621 Pressure ulcer of left heel, stage 1: Secondary | ICD-10-CM | POA: Diagnosis not present

## 2021-04-17 DIAGNOSIS — F0280 Dementia in other diseases classified elsewhere without behavioral disturbance: Secondary | ICD-10-CM | POA: Diagnosis not present

## 2021-04-17 DIAGNOSIS — L89611 Pressure ulcer of right heel, stage 1: Secondary | ICD-10-CM | POA: Diagnosis not present

## 2021-04-17 DIAGNOSIS — I69318 Other symptoms and signs involving cognitive functions following cerebral infarction: Secondary | ICD-10-CM | POA: Diagnosis not present

## 2021-04-18 DIAGNOSIS — R1310 Dysphagia, unspecified: Secondary | ICD-10-CM | POA: Diagnosis not present

## 2021-04-18 DIAGNOSIS — G311 Senile degeneration of brain, not elsewhere classified: Secondary | ICD-10-CM | POA: Diagnosis not present

## 2021-04-18 DIAGNOSIS — D696 Thrombocytopenia, unspecified: Secondary | ICD-10-CM | POA: Diagnosis not present

## 2021-04-18 DIAGNOSIS — I5033 Acute on chronic diastolic (congestive) heart failure: Secondary | ICD-10-CM | POA: Diagnosis not present

## 2021-04-18 DIAGNOSIS — R296 Repeated falls: Secondary | ICD-10-CM | POA: Diagnosis not present

## 2021-04-18 DIAGNOSIS — F0280 Dementia in other diseases classified elsewhere without behavioral disturbance: Secondary | ICD-10-CM | POA: Diagnosis not present

## 2021-04-18 LAB — CULTURE, BLOOD (ROUTINE X 2)
Culture: NO GROWTH
Culture: NO GROWTH
Special Requests: ADEQUATE

## 2021-04-19 DIAGNOSIS — R1310 Dysphagia, unspecified: Secondary | ICD-10-CM | POA: Diagnosis not present

## 2021-04-19 DIAGNOSIS — G311 Senile degeneration of brain, not elsewhere classified: Secondary | ICD-10-CM | POA: Diagnosis not present

## 2021-04-19 DIAGNOSIS — R296 Repeated falls: Secondary | ICD-10-CM | POA: Diagnosis not present

## 2021-04-19 DIAGNOSIS — D696 Thrombocytopenia, unspecified: Secondary | ICD-10-CM | POA: Diagnosis not present

## 2021-04-19 DIAGNOSIS — I5033 Acute on chronic diastolic (congestive) heart failure: Secondary | ICD-10-CM | POA: Diagnosis not present

## 2021-04-19 DIAGNOSIS — F0280 Dementia in other diseases classified elsewhere without behavioral disturbance: Secondary | ICD-10-CM | POA: Diagnosis not present

## 2021-04-22 DIAGNOSIS — Z8616 Personal history of COVID-19: Secondary | ICD-10-CM | POA: Diagnosis not present

## 2021-04-26 DIAGNOSIS — D696 Thrombocytopenia, unspecified: Secondary | ICD-10-CM | POA: Diagnosis not present

## 2021-04-26 DIAGNOSIS — I5033 Acute on chronic diastolic (congestive) heart failure: Secondary | ICD-10-CM | POA: Diagnosis not present

## 2021-04-26 DIAGNOSIS — G311 Senile degeneration of brain, not elsewhere classified: Secondary | ICD-10-CM | POA: Diagnosis not present

## 2021-04-26 DIAGNOSIS — F0280 Dementia in other diseases classified elsewhere without behavioral disturbance: Secondary | ICD-10-CM | POA: Diagnosis not present

## 2021-04-26 DIAGNOSIS — R1310 Dysphagia, unspecified: Secondary | ICD-10-CM | POA: Diagnosis not present

## 2021-04-26 DIAGNOSIS — R296 Repeated falls: Secondary | ICD-10-CM | POA: Diagnosis not present

## 2021-04-29 DIAGNOSIS — Z8616 Personal history of COVID-19: Secondary | ICD-10-CM | POA: Diagnosis not present

## 2021-04-29 DIAGNOSIS — F0280 Dementia in other diseases classified elsewhere without behavioral disturbance: Secondary | ICD-10-CM | POA: Diagnosis not present

## 2021-04-29 DIAGNOSIS — D696 Thrombocytopenia, unspecified: Secondary | ICD-10-CM | POA: Diagnosis not present

## 2021-04-29 DIAGNOSIS — R296 Repeated falls: Secondary | ICD-10-CM | POA: Diagnosis not present

## 2021-04-29 DIAGNOSIS — R1310 Dysphagia, unspecified: Secondary | ICD-10-CM | POA: Diagnosis not present

## 2021-04-29 DIAGNOSIS — G311 Senile degeneration of brain, not elsewhere classified: Secondary | ICD-10-CM | POA: Diagnosis not present

## 2021-04-29 DIAGNOSIS — I5033 Acute on chronic diastolic (congestive) heart failure: Secondary | ICD-10-CM | POA: Diagnosis not present

## 2021-04-30 DIAGNOSIS — I5033 Acute on chronic diastolic (congestive) heart failure: Secondary | ICD-10-CM | POA: Diagnosis not present

## 2021-04-30 DIAGNOSIS — G311 Senile degeneration of brain, not elsewhere classified: Secondary | ICD-10-CM | POA: Diagnosis not present

## 2021-04-30 DIAGNOSIS — R296 Repeated falls: Secondary | ICD-10-CM | POA: Diagnosis not present

## 2021-04-30 DIAGNOSIS — R1310 Dysphagia, unspecified: Secondary | ICD-10-CM | POA: Diagnosis not present

## 2021-04-30 DIAGNOSIS — D696 Thrombocytopenia, unspecified: Secondary | ICD-10-CM | POA: Diagnosis not present

## 2021-04-30 DIAGNOSIS — F0280 Dementia in other diseases classified elsewhere without behavioral disturbance: Secondary | ICD-10-CM | POA: Diagnosis not present

## 2021-05-03 ENCOUNTER — Telehealth: Payer: Medicare Other | Admitting: Cardiology

## 2021-05-04 DIAGNOSIS — H04129 Dry eye syndrome of unspecified lacrimal gland: Secondary | ICD-10-CM | POA: Diagnosis not present

## 2021-05-04 DIAGNOSIS — I447 Left bundle-branch block, unspecified: Secondary | ICD-10-CM | POA: Diagnosis not present

## 2021-05-04 DIAGNOSIS — L89621 Pressure ulcer of left heel, stage 1: Secondary | ICD-10-CM | POA: Diagnosis not present

## 2021-05-04 DIAGNOSIS — R627 Adult failure to thrive: Secondary | ICD-10-CM | POA: Diagnosis not present

## 2021-05-04 DIAGNOSIS — R001 Bradycardia, unspecified: Secondary | ICD-10-CM | POA: Diagnosis not present

## 2021-05-04 DIAGNOSIS — E0510 Thyrotoxicosis with toxic single thyroid nodule without thyrotoxic crisis or storm: Secondary | ICD-10-CM | POA: Diagnosis not present

## 2021-05-04 DIAGNOSIS — I5033 Acute on chronic diastolic (congestive) heart failure: Secondary | ICD-10-CM | POA: Diagnosis not present

## 2021-05-04 DIAGNOSIS — D696 Thrombocytopenia, unspecified: Secondary | ICD-10-CM | POA: Diagnosis not present

## 2021-05-04 DIAGNOSIS — R296 Repeated falls: Secondary | ICD-10-CM | POA: Diagnosis not present

## 2021-05-04 DIAGNOSIS — G9341 Metabolic encephalopathy: Secondary | ICD-10-CM | POA: Diagnosis not present

## 2021-05-04 DIAGNOSIS — G311 Senile degeneration of brain, not elsewhere classified: Secondary | ICD-10-CM | POA: Diagnosis not present

## 2021-05-04 DIAGNOSIS — F0280 Dementia in other diseases classified elsewhere without behavioral disturbance: Secondary | ICD-10-CM | POA: Diagnosis not present

## 2021-05-04 DIAGNOSIS — I350 Nonrheumatic aortic (valve) stenosis: Secondary | ICD-10-CM | POA: Diagnosis not present

## 2021-05-04 DIAGNOSIS — L89611 Pressure ulcer of right heel, stage 1: Secondary | ICD-10-CM | POA: Diagnosis not present

## 2021-05-04 DIAGNOSIS — K219 Gastro-esophageal reflux disease without esophagitis: Secondary | ICD-10-CM | POA: Diagnosis not present

## 2021-05-04 DIAGNOSIS — E1122 Type 2 diabetes mellitus with diabetic chronic kidney disease: Secondary | ICD-10-CM | POA: Diagnosis not present

## 2021-05-04 DIAGNOSIS — I69318 Other symptoms and signs involving cognitive functions following cerebral infarction: Secondary | ICD-10-CM | POA: Diagnosis not present

## 2021-05-04 DIAGNOSIS — R1310 Dysphagia, unspecified: Secondary | ICD-10-CM | POA: Diagnosis not present

## 2021-05-04 DIAGNOSIS — I4891 Unspecified atrial fibrillation: Secondary | ICD-10-CM | POA: Diagnosis not present

## 2021-05-04 DIAGNOSIS — L89152 Pressure ulcer of sacral region, stage 2: Secondary | ICD-10-CM | POA: Diagnosis not present

## 2021-05-04 DIAGNOSIS — N184 Chronic kidney disease, stage 4 (severe): Secondary | ICD-10-CM | POA: Diagnosis not present

## 2021-05-06 DIAGNOSIS — R296 Repeated falls: Secondary | ICD-10-CM | POA: Diagnosis not present

## 2021-05-06 DIAGNOSIS — F0280 Dementia in other diseases classified elsewhere without behavioral disturbance: Secondary | ICD-10-CM | POA: Diagnosis not present

## 2021-05-06 DIAGNOSIS — Z20828 Contact with and (suspected) exposure to other viral communicable diseases: Secondary | ICD-10-CM | POA: Diagnosis not present

## 2021-05-06 DIAGNOSIS — R1310 Dysphagia, unspecified: Secondary | ICD-10-CM | POA: Diagnosis not present

## 2021-05-06 DIAGNOSIS — I5033 Acute on chronic diastolic (congestive) heart failure: Secondary | ICD-10-CM | POA: Diagnosis not present

## 2021-05-06 DIAGNOSIS — G311 Senile degeneration of brain, not elsewhere classified: Secondary | ICD-10-CM | POA: Diagnosis not present

## 2021-05-06 DIAGNOSIS — D696 Thrombocytopenia, unspecified: Secondary | ICD-10-CM | POA: Diagnosis not present

## 2021-05-10 DIAGNOSIS — R296 Repeated falls: Secondary | ICD-10-CM | POA: Diagnosis not present

## 2021-05-10 DIAGNOSIS — D696 Thrombocytopenia, unspecified: Secondary | ICD-10-CM | POA: Diagnosis not present

## 2021-05-10 DIAGNOSIS — G311 Senile degeneration of brain, not elsewhere classified: Secondary | ICD-10-CM | POA: Diagnosis not present

## 2021-05-10 DIAGNOSIS — I5033 Acute on chronic diastolic (congestive) heart failure: Secondary | ICD-10-CM | POA: Diagnosis not present

## 2021-05-10 DIAGNOSIS — R1310 Dysphagia, unspecified: Secondary | ICD-10-CM | POA: Diagnosis not present

## 2021-05-10 DIAGNOSIS — F0280 Dementia in other diseases classified elsewhere without behavioral disturbance: Secondary | ICD-10-CM | POA: Diagnosis not present

## 2021-05-11 DIAGNOSIS — I5033 Acute on chronic diastolic (congestive) heart failure: Secondary | ICD-10-CM | POA: Diagnosis not present

## 2021-05-11 DIAGNOSIS — R1310 Dysphagia, unspecified: Secondary | ICD-10-CM | POA: Diagnosis not present

## 2021-05-11 DIAGNOSIS — G311 Senile degeneration of brain, not elsewhere classified: Secondary | ICD-10-CM | POA: Diagnosis not present

## 2021-05-11 DIAGNOSIS — D696 Thrombocytopenia, unspecified: Secondary | ICD-10-CM | POA: Diagnosis not present

## 2021-05-11 DIAGNOSIS — R296 Repeated falls: Secondary | ICD-10-CM | POA: Diagnosis not present

## 2021-05-11 DIAGNOSIS — F0280 Dementia in other diseases classified elsewhere without behavioral disturbance: Secondary | ICD-10-CM | POA: Diagnosis not present

## 2021-06-04 DEATH — deceased
